# Patient Record
Sex: Female | Born: 1955
Health system: Southern US, Community
[De-identification: ages and names within clinical notes are randomized; demographics above are authoritative.]

## PROBLEM LIST (undated history)

## (undated) DIAGNOSIS — K219 Gastro-esophageal reflux disease without esophagitis: Secondary | ICD-10-CM

## (undated) DIAGNOSIS — E039 Hypothyroidism, unspecified: Secondary | ICD-10-CM

## (undated) DIAGNOSIS — I7100 Dissection of unspecified site of aorta: Secondary | ICD-10-CM

## (undated) DIAGNOSIS — H409 Unspecified glaucoma: Secondary | ICD-10-CM

## (undated) DIAGNOSIS — H543 Unqualified visual loss, both eyes: Secondary | ICD-10-CM

## (undated) DIAGNOSIS — E785 Hyperlipidemia, unspecified: Secondary | ICD-10-CM

## (undated) DIAGNOSIS — R7303 Prediabetes: Secondary | ICD-10-CM

## (undated) DIAGNOSIS — I739 Peripheral vascular disease, unspecified: Secondary | ICD-10-CM

## (undated) DIAGNOSIS — M1990 Unspecified osteoarthritis, unspecified site: Secondary | ICD-10-CM

## (undated) DIAGNOSIS — F329 Major depressive disorder, single episode, unspecified: Secondary | ICD-10-CM

## (undated) DIAGNOSIS — F32A Depression, unspecified: Secondary | ICD-10-CM

## (undated) DIAGNOSIS — D649 Anemia, unspecified: Secondary | ICD-10-CM

## (undated) DIAGNOSIS — I10 Essential (primary) hypertension: Secondary | ICD-10-CM

## (undated) DIAGNOSIS — F419 Anxiety disorder, unspecified: Secondary | ICD-10-CM

## (undated) DIAGNOSIS — Z9289 Personal history of other medical treatment: Secondary | ICD-10-CM

## (undated) DIAGNOSIS — Z8614 Personal history of Methicillin resistant Staphylococcus aureus infection: Secondary | ICD-10-CM

## (undated) HISTORY — DX: Morbid (severe) obesity due to excess calories: E66.01

## (undated) HISTORY — DX: Essential (primary) hypertension: I10

## (undated) HISTORY — PX: TONSILLECTOMY: SUR1361

## (undated) HISTORY — PX: EYE SURGERY: SHX253

## (undated) HISTORY — DX: Hyperlipidemia, unspecified: E78.5

## (undated) HISTORY — PX: COLONOSCOPY: SHX174

## (undated) HISTORY — PX: KNEE SURGERY: SHX244

---

## 1979-07-05 HISTORY — PX: ABDOMINAL HYSTERECTOMY: SHX81

## 2008-07-04 HISTORY — PX: CATARACT EXTRACTION: SUR2

## 2014-06-22 HISTORY — PX: KNEE DEBRIDEMENT: SHX1894

## 2014-07-04 HISTORY — PX: JOINT REPLACEMENT: SHX530

## 2014-07-07 HISTORY — PX: DEBRIDEMENT AND CLOSURE WOUND: SHX5614

## 2016-01-13 ENCOUNTER — Other Ambulatory Visit (HOSPITAL_COMMUNITY): Payer: Self-pay | Admitting: General Surgery

## 2016-01-14 ENCOUNTER — Other Ambulatory Visit: Payer: Self-pay | Admitting: General Surgery

## 2016-01-14 DIAGNOSIS — Z1231 Encounter for screening mammogram for malignant neoplasm of breast: Secondary | ICD-10-CM

## 2016-01-19 ENCOUNTER — Ambulatory Visit
Admission: RE | Admit: 2016-01-19 | Discharge: 2016-01-19 | Disposition: A | Discharge status: Home / Self Care | Payer: BLUE CROSS/BLUE SHIELD | Source: Ambulatory Visit | Attending: General Surgery | Admitting: General Surgery

## 2016-01-19 DIAGNOSIS — Z1231 Encounter for screening mammogram for malignant neoplasm of breast: Secondary | ICD-10-CM

## 2016-01-25 ENCOUNTER — Ambulatory Visit (HOSPITAL_COMMUNITY)
Admission: RE | Admit: 2016-01-25 | Discharge: 2016-01-25 | Disposition: A | Discharge status: Home / Self Care | Payer: BLUE CROSS/BLUE SHIELD | Source: Ambulatory Visit | Attending: General Surgery | Admitting: General Surgery

## 2016-01-25 DIAGNOSIS — K219 Gastro-esophageal reflux disease without esophagitis: Secondary | ICD-10-CM | POA: Diagnosis not present

## 2016-01-25 DIAGNOSIS — M5136 Other intervertebral disc degeneration, lumbar region: Secondary | ICD-10-CM | POA: Diagnosis not present

## 2016-01-25 DIAGNOSIS — N200 Calculus of kidney: Secondary | ICD-10-CM | POA: Insufficient documentation

## 2016-01-25 DIAGNOSIS — M4186 Other forms of scoliosis, lumbar region: Secondary | ICD-10-CM | POA: Diagnosis not present

## 2016-01-25 DIAGNOSIS — K5710 Diverticulosis of small intestine without perforation or abscess without bleeding: Secondary | ICD-10-CM | POA: Insufficient documentation

## 2016-01-25 HISTORY — PX: UPPER GI ENDOSCOPY: SHX6162

## 2016-02-03 ENCOUNTER — Encounter: Payer: BLUE CROSS/BLUE SHIELD | Attending: General Surgery | Admitting: Dietician

## 2016-02-03 ENCOUNTER — Encounter: Payer: Self-pay | Admitting: Dietician

## 2016-02-03 DIAGNOSIS — I10 Essential (primary) hypertension: Secondary | ICD-10-CM | POA: Insufficient documentation

## 2016-02-03 DIAGNOSIS — R7303 Prediabetes: Secondary | ICD-10-CM | POA: Insufficient documentation

## 2016-02-03 DIAGNOSIS — Z713 Dietary counseling and surveillance: Secondary | ICD-10-CM | POA: Insufficient documentation

## 2016-02-03 DIAGNOSIS — M1611 Unilateral primary osteoarthritis, right hip: Secondary | ICD-10-CM | POA: Diagnosis not present

## 2016-02-03 NOTE — Progress Notes (Signed)
  Pre-Op Assessment Visit:  Pre-Operative sleeve gastrectomy Surgery  Medical Nutrition Therapy:  Appt start time: 0940   End time:  1020.  Patient was seen on 02/03/2016 for Pre-Operative Nutrition Assessment. Assessment and letter of approval faxed to [facility] Bariatric Surgery Program coordinator on 02/03/2016.   Preferred Learning Style:   No preference indicated   Learning Readiness:   Ready  Handouts given during visit include:  Pre-Op Goals Bariatric Surgery Protein Shakes   During the appointment today the following Pre-Op Goals were reviewed with the patient: Maintain or lose weight as instructed by your surgeon Make healthy food choices Begin to limit portion sizes Limited concentrated sugars and fried foods Keep fat/sugar in the single digits per serving on   food labels Practice CHEWING your food  (aim for 30 chews per bite or until applesauce consistency) Practice not drinking 15 minutes before, during, and 30 minutes after each meal/snack Avoid all carbonated beverages  Avoid/limit caffeinated beverages  Avoid all sugar-sweetened beverages Consume 3 meals per day; eat every 3-5 hours Make a list of non-food related activities Aim for 64-100 ounces of FLUID daily  Aim for at least 60-80 grams of PROTEIN daily Look for a liquid protein source that contain ?15 g protein and ?5 g carbohydrate  (ex: shakes, drinks, shots)  Demonstrated degree of understanding via:  Teach Back  Teaching Method Utilized:  Visual Auditory Hands on  Barriers to learning/adherence to lifestyle change: limited mobility  [facility] to call the [facility] to enroll in Pre-Op and Post-Op Nutrition Education when surgery date is scheduled.

## 2016-02-08 ENCOUNTER — Ambulatory Visit: Payer: BLUE CROSS/BLUE SHIELD

## 2016-02-15 DIAGNOSIS — Z713 Dietary counseling and surveillance: Secondary | ICD-10-CM | POA: Diagnosis not present

## 2016-02-17 NOTE — Progress Notes (Signed)
  Pre-Operative Nutrition Class:  Appt start time: 0051   End time:  1830.  Patient was seen on 02/15/2016 for Pre-Operative Bariatric Surgery Education at the [facility].   Surgery date:  Surgery type: sleeve gastrectomy Start weight at [facility]: 215 lbs on 02/03/2016 Weight today: 212.2 lbs  TANITA  BODY COMP RESULTS  02/15/16   BMI (kg/m^2) 41.4   Fat Mass (lbs) 87.6   Fat Free Mass (lbs) 124.6   Total Body Water (lbs) 89.2   Samples given per MNT protocol. Patient educated on appropriate usage: Premier protein shake (vanilla - qty 1) Lot #: [id number] Exp: 09/2016  Bariatric Advantage Calcium Citrate chew (lemon - qty 1) Lot #: [id number] Exp: 12/2016  Bariatric Advantage Vitamins Multivitamin (mixed fruit - qty 1) Lot #: [id number] Exp: 01/2017  Renee Pain Protein Powder (chocolate - qty 1) Lot #: [id number] Exp: 04/2017  The following the learning objectives were met by the patient during this course:  Identify Pre-Op Dietary Goals and will begin 2 weeks pre-operatively  Identify appropriate sources of fluids and proteins   State protein recommendations and appropriate sources pre and post-operatively  Identify Post-Operative Dietary Goals and will follow for 2 weeks post-operatively  Identify appropriate multivitamin and calcium sources  Describe the need for physical activity post-operatively and will follow MD recommendations  State when to call healthcare provider regarding medication questions or post-operative complications  Handouts given during class include:  Pre-Op Bariatric Surgery Diet Handout  Protein Shake Handout  Post-Op Bariatric Surgery Nutrition Handout  BELT Program Information Flyer  Support Group Information Flyer  [facility] Bariatric Supplements Price List  Follow-Up Plan: Patient will follow-up at [facility] 2 weeks post operatively for diet advancement per MD.

## 2016-02-19 ENCOUNTER — Encounter: Payer: Self-pay | Admitting: Internal Medicine

## 2016-02-19 ENCOUNTER — Ambulatory Visit (INDEPENDENT_AMBULATORY_CARE_PROVIDER_SITE_OTHER): Payer: BLUE CROSS/BLUE SHIELD | Admitting: Internal Medicine

## 2016-02-19 VITALS — BP 100/64 | HR 66 | Ht 60.0 in | Wt 211.8 lb

## 2016-02-19 DIAGNOSIS — I10 Essential (primary) hypertension: Secondary | ICD-10-CM | POA: Diagnosis not present

## 2016-02-19 DIAGNOSIS — Z01810 Encounter for preprocedural cardiovascular examination: Secondary | ICD-10-CM | POA: Diagnosis not present

## 2016-02-19 DIAGNOSIS — E785 Hyperlipidemia, unspecified: Secondary | ICD-10-CM | POA: Diagnosis not present

## 2016-02-19 NOTE — Progress Notes (Signed)
OFFICE NOTE  Chief Complaint:  Preoperative clearance  Primary Care Physician: Blane OharaOX,KIRSTEN, MD  HPI:  Lauren Clark is a 60 y.o. female with a past medical history significant for morbid obesity, hypertension, dyslipidemia and knee surgery last year which became infected and required repeat operations. She reports significant right hip problems and has not been a candidate for hip replacement due to her weight. She was referred then to Dr. Andrey CampanileWilson for evaluation of bariatric surgery. Subsequently she was sent here for evaluation for cardiac risk. There is no significant heart disease in her family. She denies any history of chest pain or shortness of breath with exertion. She was fairly physically active up until last year and asymptomatic. She has type hypertension but is very well controlled. Blood pressure today was 100/64. Her cholesterol has been at goal. She has impaired fasting glucose but not diabetes. She has no history of alcohol or tobacco use. 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.  PMHx:  Past Medical History:  Diagnosis Date  . Hyperlipidemia   . Hypertension   . Morbid obesity (HCC)     Past Surgical History:  Procedure Laterality Date  . ABDOMINAL HYSTERECTOMY      FAMHx:  Family History  Problem Relation Age of Onset  . Depression Mother   . Hypertension Father     SOCHx:   reports that she has never smoked. She has never used smokeless tobacco. She reports that she does not drink alcohol or use drugs.  ALLERGIES:  Allergies  Allergen Reactions  . Crestor [Rosuvastatin Calcium] Other (See Comments)    Muscle aches    . Hctz [Hydrochlorothiazide] Other (See Comments)    Pt doesn't remember  . Lipitor [Atorvastatin] Other (See Comments)    Muscle aches     ROS: Pertinent items noted in HPI and remainder of comprehensive ROS otherwise negative.  HOME MEDS: Current Outpatient Prescriptions  Medication Sig Dispense Refill  . aspirin EC  81 MG tablet Take 81 mg by mouth daily.    Marland Kitchen. diltiazem (CARDIZEM CD) 180 MG 24 hr capsule Take 180 mg by mouth daily.    . furosemide (LASIX) 20 MG tablet Take 20 mg by mouth.    Marland Kitchen. glucosamine-chondroitin 500-400 MG tablet Take 1 tablet by mouth 3 (three) times daily.    Marland Kitchen. latanoprost (XALATAN) 0.005 % ophthalmic solution 1 drop at bedtime.    Marland Kitchen. levothyroxine (SYNTHROID, LEVOTHROID) 75 MCG tablet Take 75 mcg by mouth daily before breakfast.    . Lorcaserin HCl (BELVIQ) 10 MG TABS Take by mouth.    . magnesium citrate SOLN Take 1 Bottle by mouth once.    . meloxicam (MOBIC) 15 MG tablet Take 15 mg by mouth daily.    . phentermine 37.5 MG capsule Take 37.5 mg by mouth every morning.    . rosuvastatin (CRESTOR) 5 MG tablet Take 5 mg by mouth daily.    . traMADol (ULTRAM) 50 MG tablet Take by mouth every 6 (six) hours as needed.    . Vitamin D, Ergocalciferol, (DRISDOL) 50000 units CAPS capsule Take 50,000 Units by mouth every 7 (seven) days.     No current facility-administered medications for this visit.     LABS/IMAGING: No results found for this or any previous visit (from the past 48 hour(s)). No results found.  WEIGHTS: Wt Readings from Last 3 Encounters:  02/19/16 211 lb 12.8 oz (96.1 kg)  02/17/16 212 lb 3.2 oz (96.3 kg)  02/03/16  214 lb 14.4 oz (97.5 kg)    VITALS: BP 100/64 (BP Location: Right Wrist, Cuff Size: Normal)   Pulse 66   Ht 5' (1.524 m)   Wt 211 lb 12.8 oz (96.1 kg)   BMI 41.36 kg/m   EXAM: General appearance: alert, no distress and morbidly obese Neck: no carotid bruit and no JVD Lungs: clear to auscultation bilaterally Heart: regular rate and rhythm Abdomen: soft, non-tender; bowel sounds normal; no masses,  no organomegaly and morbidly obese Extremities: extremities normal, atraumatic, no cyanosis or edema and right knee midline scar Pulses: 2+ and symmetric Skin: Skin color, texture, turgor normal. No rashes or lesions Neurologic: Grossly  normal Psych: Pleasant  EKG: Normal sinus rhythm at 66, rightward axis, no ischemia  ASSESSMENT: 1. Low to intermediate risk for bariatric surgery 2. Hypertension-controlled 3. Dyslipidemia-at goal 4. Morbid obesity 5. Right hip pain  PLAN: 1.   Lauren Clark is quite asymptomatic. She was physically active up until about a year ago prior to her knee surgery and has had progressive hip pain. Although she is morbidly obese, she is not diabetic and has hypertension and dyslipidemia which are well controlled. EKG is normal. There is no significant heart disease in her family that she is aware of. She does not have sleep apnea and a low sleepiness score. She's never been a smoker. I think she is at acceptable risk for her upcoming surgery which is a ready scheduled on August 28. She should also be acceptable risk for hip surgery if performed within the next year. No further ischemic testing is necessary at this time. Follow-up with me as needed.  Thanks for the kind referral.  Chrystie NoseKenneth C. Hilty, MD, [facility]ACC Attending Cardiologist [facility]  Chrystie NoseKenneth C Hilty 02/19/2016, 9:37 AM

## 2016-02-19 NOTE — Patient Instructions (Signed)
Your physician recommends that you schedule a follow-up appointment as needed w/Dr. Rennis GoldenHilty  Dr. Rennis GoldenHilty has cleared your for your surgical procedure

## 2016-02-23 NOTE — Progress Notes (Signed)
ekg with lo and clearance dr hilty 8/17 epic, chest 1/17 epic

## 2016-02-24 ENCOUNTER — Ambulatory Visit: Payer: Self-pay | Admitting: General Surgery

## 2016-02-24 ENCOUNTER — Encounter (HOSPITAL_COMMUNITY): Payer: Self-pay

## 2016-02-24 ENCOUNTER — Encounter (HOSPITAL_COMMUNITY)
Admission: RE | Admit: 2016-02-24 | Discharge: 2016-02-24 | Disposition: A | Discharge status: Home / Self Care | Payer: BLUE CROSS/BLUE SHIELD | Source: Ambulatory Visit | Attending: General Surgery | Admitting: General Surgery

## 2016-02-24 DIAGNOSIS — Z01812 Encounter for preprocedural laboratory examination: Secondary | ICD-10-CM | POA: Diagnosis present

## 2016-02-24 HISTORY — DX: Personal history of other medical treatment: Z92.89

## 2016-02-24 HISTORY — DX: Anxiety disorder, unspecified: F41.9

## 2016-02-24 HISTORY — DX: Unspecified osteoarthritis, unspecified site: M19.90

## 2016-02-24 HISTORY — DX: Peripheral vascular disease, unspecified: I73.9

## 2016-02-24 HISTORY — DX: Depression, unspecified: F32.A

## 2016-02-24 HISTORY — DX: Major depressive disorder, single episode, unspecified: F32.9

## 2016-02-24 LAB — CBC WITH DIFFERENTIAL/PLATELET
Basophils Absolute: 0 10*3/uL (ref 0.0–0.1)
Basophils Relative: 0 %
EOS ABS: 0.1 10*3/uL (ref 0.0–0.7)
Eosinophils Relative: 1 %
HEMATOCRIT: 42.9 % (ref 36.0–46.0)
HEMOGLOBIN: 14.1 g/dL (ref 12.0–15.0)
LYMPHS ABS: 1.2 10*3/uL (ref 0.7–4.0)
Lymphocytes Relative: 11 %
MCH: 30.2 pg (ref 26.0–34.0)
MCHC: 32.9 g/dL (ref 30.0–36.0)
MCV: 91.9 fL (ref 78.0–100.0)
Monocytes Absolute: 1 10*3/uL (ref 0.1–1.0)
Monocytes Relative: 9 %
NEUTROS ABS: 8.8 10*3/uL — AB (ref 1.7–7.7)
NEUTROS PCT: 79 %
Platelets: 324 10*3/uL (ref 150–400)
RBC: 4.67 MIL/uL (ref 3.87–5.11)
RDW: 13 % (ref 11.5–15.5)
WBC: 11 10*3/uL — AB (ref 4.0–10.5)

## 2016-02-24 LAB — COMPREHENSIVE METABOLIC PANEL
ALBUMIN: 4.4 g/dL (ref 3.5–5.0)
ALK PHOS: 118 U/L (ref 38–126)
ALT: 20 U/L (ref 14–54)
AST: 21 U/L (ref 15–41)
Anion gap: 10 (ref 5–15)
BILIRUBIN TOTAL: 0.5 mg/dL (ref 0.3–1.2)
BUN: 30 mg/dL — ABNORMAL HIGH (ref 6–20)
CALCIUM: 9.6 mg/dL (ref 8.9–10.3)
CO2: 27 mmol/L (ref 22–32)
CREATININE: 1.28 mg/dL — AB (ref 0.44–1.00)
Chloride: 101 mmol/L (ref 101–111)
GFR calc non Af Amer: 44 mL/min — ABNORMAL LOW (ref >60)
GFR, EST AFRICAN AMERICAN: 51 mL/min — AB (ref >60)
GLUCOSE: 107 mg/dL — AB (ref 65–99)
Potassium: 4.6 mmol/L (ref 3.5–5.1)
SODIUM: 138 mmol/L (ref 135–145)
Total Protein: 7.8 g/dL (ref 6.5–8.1)

## 2016-02-24 NOTE — Progress Notes (Signed)
PAT VISIT-- pt states still taking Belviq and Phenteramine.  As per anesthesia protocol  instructed to stop as of today. Verbalized understanding

## 2016-02-24 NOTE — Patient Instructions (Signed)
Lyla SonWaneta B Eckles  02/24/2016   Your procedure is scheduled on: 02/29/16  [facility]  [place]  elevators to 3rd floor to  Short Stay Center at  [phone] AM.  Call this number if you have problems the morning of surgery [phone]   Remember: ONLY 1 PERSON MAY GO WITH YOU TO SHORT STAY TO GET  READY MORNING OF YOUR SURGERY.  Do not eat food or drink liquids :After Midnight.     Take these medicines the morning of surgery with A SIP OF WATER: Fluoxetine, Levothyroxine May takr Tramadol if needed                                You may not have any metal on your body including hair pins and              piercings  Do not wear jewelry, make-up, lotions, powders or perfumes, deodorant             Do not wear nail polish.  Do not shave  48 hours prior to surgery.              Men may shave face and neck.   Do not bring valuables to the hospital. [facility] IS NOT             RESPONSIBLE   FOR VALUABLES.  Contacts, dentures or bridgework may not be worn into surgery.  Leave suitcase in the car. After surgery it may be brought to your room.              [place] - Preparing for Surgery Before surgery, you can play an important role.  Because skin is not sterile, your skin needs to be as free of germs as possible.  You can reduce the number of germs on your skin by washing with CHG (chlorahexidine gluconate) soap before surgery.  CHG is an antiseptic cleaner which kills germs and bonds with the skin to continue killing germs even after washing. Please DO NOT use if you have an allergy to CHG or antibacterial soaps.  If your skin becomes reddened/irritated stop using the CHG and inform your nurse when you arrive at Short Stay. Do not shave (including legs and underarms) for at least 48 hours prior to the first CHG shower.  You may shave your face/neck. Please follow these instructions carefully:  1.  Shower with CHG Soap the night before surgery  and the  morning of Surgery.  2.  If you choose to wash your hair, wash your hair first as usual with your  normal  shampoo.  3.  After you shampoo, rinse your hair and body thoroughly to remove the  shampoo.                           4.  Use CHG as you would any other liquid soap.  You can apply chg directly  to the skin and wash                       Gently with a scrungie or clean washcloth.  5.  Apply the CHG Soap to your body ONLY FROM THE NECK DOWN.   Do not use on face/ open  Wound or open sores. Avoid contact with eyes, ears mouth and genitals (private parts).                       Wash face,  Genitals (private parts) with your normal soap.             6.  Wash thoroughly, paying special attention to the area where your surgery  will be performed.  7.  Thoroughly rinse your body with warm water from the neck down.  8.  DO NOT shower/wash with your normal soap after using and rinsing off  the CHG Soap.                9.  Pat yourself dry with a clean towel.            10.  Wear clean pajamas.            11.  Place clean sheets on your bed the night of your first shower and do not  sleep with pets. Day of Surgery : Do not apply any lotions/deodorants the morning of surgery.  Please wear clean clothes to the hospital/surgery center.  FAILURE TO FOLLOW THESE INSTRUCTIONS MAY RESULT IN THE CANCELLATION OF YOUR SURGERY PATIENT SIGNATURE_________________________________  NURSE SIGNATURE__________________________________  ________________________________________________________________________

## 2016-02-29 ENCOUNTER — Inpatient Hospital Stay (HOSPITAL_COMMUNITY)
Admission: RE | Admit: 2016-02-29 | Discharge: 2016-03-02 | DRG: 621 | Disposition: A | Discharge status: Home / Self Care | Payer: BLUE CROSS/BLUE SHIELD | Source: Ambulatory Visit | Attending: General Surgery | Admitting: General Surgery

## 2016-02-29 ENCOUNTER — Inpatient Hospital Stay (HOSPITAL_COMMUNITY): Payer: BLUE CROSS/BLUE SHIELD | Admitting: Anesthesiology

## 2016-02-29 ENCOUNTER — Encounter (HOSPITAL_COMMUNITY)
Admission: RE | Disposition: A | Discharge status: Home / Self Care | Payer: Self-pay | Source: Ambulatory Visit | Attending: General Surgery

## 2016-02-29 ENCOUNTER — Encounter (HOSPITAL_COMMUNITY): Payer: Self-pay | Admitting: *Deleted

## 2016-02-29 DIAGNOSIS — R7303 Prediabetes: Secondary | ICD-10-CM | POA: Diagnosis present

## 2016-02-29 DIAGNOSIS — K219 Gastro-esophageal reflux disease without esophagitis: Secondary | ICD-10-CM | POA: Diagnosis present

## 2016-02-29 DIAGNOSIS — Z79899 Other long term (current) drug therapy: Secondary | ICD-10-CM | POA: Diagnosis not present

## 2016-02-29 DIAGNOSIS — Z96651 Presence of right artificial knee joint: Secondary | ICD-10-CM | POA: Diagnosis present

## 2016-02-29 DIAGNOSIS — I10 Essential (primary) hypertension: Secondary | ICD-10-CM | POA: Diagnosis present

## 2016-02-29 DIAGNOSIS — Z6841 Body Mass Index (BMI) 40.0 and over, adult: Secondary | ICD-10-CM

## 2016-02-29 DIAGNOSIS — M1611 Unilateral primary osteoarthritis, right hip: Secondary | ICD-10-CM | POA: Diagnosis present

## 2016-02-29 DIAGNOSIS — E785 Hyperlipidemia, unspecified: Secondary | ICD-10-CM | POA: Diagnosis present

## 2016-02-29 DIAGNOSIS — E079 Disorder of thyroid, unspecified: Secondary | ICD-10-CM | POA: Diagnosis present

## 2016-02-29 DIAGNOSIS — Z9841 Cataract extraction status, right eye: Secondary | ICD-10-CM

## 2016-02-29 DIAGNOSIS — Z9884 Bariatric surgery status: Secondary | ICD-10-CM

## 2016-02-29 HISTORY — PX: LAPAROSCOPIC GASTRIC SLEEVE RESECTION: SHX5895

## 2016-02-29 LAB — HEMOGLOBIN AND HEMATOCRIT, BLOOD
HEMATOCRIT: 35.2 % — AB (ref 36.0–46.0)
HEMOGLOBIN: 11.5 g/dL — AB (ref 12.0–15.0)

## 2016-02-29 SURGERY — GASTRECTOMY, SLEEVE, LAPAROSCOPIC
Anesthesia: General | Site: Abdomen

## 2016-02-29 MED ORDER — SODIUM CHLORIDE 0.9 % IR SOLN
Status: DC | PRN
  Administered 2016-02-29: 1000 mL

## 2016-02-29 MED ORDER — MORPHINE SULFATE (PF) 2 MG/ML IV SOLN
2.0000 mg | INTRAVENOUS | Status: DC | PRN
  Administered 2016-03-01: 2 mg via INTRAVENOUS
  Filled 2016-02-29: qty 1

## 2016-02-29 MED ORDER — HEPARIN SODIUM (PORCINE) 5000 UNIT/ML IJ SOLN
5000.0000 [IU] | INTRAMUSCULAR | Status: AC
  Administered 2016-02-29: 5000 [IU] via SUBCUTANEOUS
  Filled 2016-02-29: qty 1

## 2016-02-29 MED ORDER — ROCURONIUM BROMIDE 100 MG/10ML IV SOLN
INTRAVENOUS | Status: AC
  Filled 2016-02-29: qty 1

## 2016-02-29 MED ORDER — PROPOFOL 10 MG/ML IV BOLUS
INTRAVENOUS | Status: AC
  Filled 2016-02-29: qty 20

## 2016-02-29 MED ORDER — ONDANSETRON HCL 4 MG/2ML IJ SOLN
INTRAMUSCULAR | Status: AC
  Filled 2016-02-29: qty 2

## 2016-02-29 MED ORDER — ACETAMINOPHEN 10 MG/ML IV SOLN
INTRAVENOUS | Status: AC
  Filled 2016-02-29: qty 100

## 2016-02-29 MED ORDER — FENTANYL CITRATE (PF) 100 MCG/2ML IJ SOLN
25.0000 ug | INTRAMUSCULAR | Status: DC | PRN
  Administered 2016-02-29: 50 ug via INTRAVENOUS

## 2016-02-29 MED ORDER — KCL IN DEXTROSE-NACL 20-5-0.45 MEQ/L-%-% IV SOLN
INTRAVENOUS | Status: DC
  Administered 2016-02-29: 125 mL/h via INTRAVENOUS
  Administered 2016-03-01: 11:00:00 via INTRAVENOUS
  Filled 2016-02-29 (×5): qty 1000

## 2016-02-29 MED ORDER — SUGAMMADEX SODIUM 200 MG/2ML IV SOLN
INTRAVENOUS | Status: AC
  Filled 2016-02-29: qty 2

## 2016-02-29 MED ORDER — STERILE WATER FOR IRRIGATION IR SOLN
Status: DC | PRN
  Administered 2016-02-29: 2000 mL

## 2016-02-29 MED ORDER — SUGAMMADEX SODIUM 200 MG/2ML IV SOLN
INTRAVENOUS | Status: DC | PRN
  Administered 2016-02-29: 200 mg via INTRAVENOUS

## 2016-02-29 MED ORDER — ACETAMINOPHEN 10 MG/ML IV SOLN
1000.0000 mg | Freq: Four times a day (QID) | INTRAVENOUS | Status: AC
  Administered 2016-02-29 – 2016-03-01 (×4): 1000 mg via INTRAVENOUS
  Filled 2016-02-29 (×5): qty 100

## 2016-02-29 MED ORDER — ONDANSETRON HCL 4 MG/2ML IJ SOLN: 4.0000 mg | INTRAMUSCULAR | Status: DC | PRN

## 2016-02-29 MED ORDER — OXYCODONE HCL 5 MG/5ML PO SOLN
5.0000 mg | ORAL | Status: DC | PRN
  Administered 2016-03-02: 5 mg via ORAL
  Filled 2016-02-29: qty 5

## 2016-02-29 MED ORDER — FENTANYL CITRATE (PF) 100 MCG/2ML IJ SOLN
INTRAMUSCULAR | Status: AC
Start: 2016-02-29 — End: 2016-02-29
  Filled 2016-02-29: qty 2

## 2016-02-29 MED ORDER — ENALAPRILAT 1.25 MG/ML IV SOLN
1.2500 mg | Freq: Four times a day (QID) | INTRAVENOUS | Status: DC | PRN
  Filled 2016-02-29: qty 1

## 2016-02-29 MED ORDER — FAMOTIDINE IN NACL 20-0.9 MG/50ML-% IV SOLN
20.0000 mg | Freq: Two times a day (BID) | INTRAVENOUS | Status: DC
  Administered 2016-02-29 – 2016-03-01 (×3): 20 mg via INTRAVENOUS
  Filled 2016-02-29 (×4): qty 50

## 2016-02-29 MED ORDER — CEFOTETAN DISODIUM-DEXTROSE 2-2.08 GM-% IV SOLR
2.0000 g | INTRAVENOUS | Status: AC
  Administered 2016-02-29: 2 g via INTRAVENOUS

## 2016-02-29 MED ORDER — PROMETHAZINE HCL 25 MG/ML IJ SOLN: 12.5000 mg | Freq: Four times a day (QID) | INTRAMUSCULAR | Status: DC | PRN

## 2016-02-29 MED ORDER — PREMIER PROTEIN SHAKE
2.0000 [oz_av] | ORAL | Status: DC
Start: 2016-03-02 — End: 2016-03-02
  Administered 2016-03-02 (×2): 2 [oz_av] via ORAL

## 2016-02-29 MED ORDER — DIPHENHYDRAMINE HCL 50 MG/ML IJ SOLN
12.5000 mg | Freq: Three times a day (TID) | INTRAMUSCULAR | Status: DC | PRN
Start: 2016-02-29 — End: 2016-03-02

## 2016-02-29 MED ORDER — SODIUM CHLORIDE 0.9 % IJ SOLN
INTRAMUSCULAR | Status: AC
  Filled 2016-02-29: qty 50

## 2016-02-29 MED ORDER — ACETAMINOPHEN 160 MG/5ML PO SOLN: 325.0000 mg | ORAL | Status: DC | PRN

## 2016-02-29 MED ORDER — ONDANSETRON HCL 4 MG/2ML IJ SOLN
INTRAMUSCULAR | Status: DC | PRN
  Administered 2016-02-29: 4 mg via INTRAVENOUS

## 2016-02-29 MED ORDER — ACETAMINOPHEN 160 MG/5ML PO SOLN: 650.0000 mg | ORAL | Status: DC | PRN

## 2016-02-29 MED ORDER — APREPITANT 40 MG PO CAPS
40.0000 mg | ORAL_CAPSULE | ORAL | Status: AC
  Administered 2016-02-29: 40 mg via ORAL
  Filled 2016-02-29: qty 1

## 2016-02-29 MED ORDER — ACETAMINOPHEN 10 MG/ML IV SOLN
1000.0000 mg | Freq: Once | INTRAVENOUS | Status: AC
  Administered 2016-02-29: 1000 mg via INTRAVENOUS

## 2016-02-29 MED ORDER — LIDOCAINE HCL (CARDIAC) 20 MG/ML IV SOLN
INTRAVENOUS | Status: DC | PRN
  Administered 2016-02-29: 100 mg via INTRAVENOUS

## 2016-02-29 MED ORDER — LACTATED RINGERS IR SOLN
Status: DC | PRN
  Administered 2016-02-29: 1000 mL

## 2016-02-29 MED ORDER — DEXAMETHASONE SODIUM PHOSPHATE 10 MG/ML IJ SOLN
INTRAMUSCULAR | Status: DC | PRN
  Administered 2016-02-29: 10 mg via INTRAVENOUS

## 2016-02-29 MED ORDER — FENTANYL CITRATE (PF) 100 MCG/2ML IJ SOLN
INTRAMUSCULAR | Status: AC
  Filled 2016-02-29: qty 2

## 2016-02-29 MED ORDER — FENTANYL CITRATE (PF) 100 MCG/2ML IJ SOLN
INTRAMUSCULAR | Status: DC | PRN
  Administered 2016-02-29: 150 ug via INTRAVENOUS
  Administered 2016-02-29: 50 ug via INTRAVENOUS

## 2016-02-29 MED ORDER — ROCURONIUM BROMIDE 100 MG/10ML IV SOLN
INTRAVENOUS | Status: DC | PRN
  Administered 2016-02-29: 5 mg via INTRAVENOUS
  Administered 2016-02-29: 50 mg via INTRAVENOUS

## 2016-02-29 MED ORDER — DEXAMETHASONE SODIUM PHOSPHATE 10 MG/ML IJ SOLN
INTRAMUSCULAR | Status: AC
Start: 2016-02-29 — End: 2016-02-29
  Filled 2016-02-29: qty 2

## 2016-02-29 MED ORDER — HEPARIN SODIUM (PORCINE) 5000 UNIT/ML IJ SOLN
5000.0000 [IU] | Freq: Once | INTRAMUSCULAR | Status: AC
  Administered 2016-02-29: 5000 [IU] via SUBCUTANEOUS
  Filled 2016-02-29: qty 1

## 2016-02-29 MED ORDER — LATANOPROST 0.005 % OP SOLN
1.0000 [drp] | Freq: Every day | OPHTHALMIC | Status: DC
  Administered 2016-03-01 (×2): 1 [drp] via OPHTHALMIC
  Filled 2016-02-29: qty 2.5

## 2016-02-29 MED ORDER — SODIUM CHLORIDE 0.9 % IJ SOLN
INTRAMUSCULAR | Status: DC | PRN
  Administered 2016-02-29: 50 mL

## 2016-02-29 MED ORDER — LACTATED RINGERS IV SOLN
INTRAVENOUS | Status: DC
  Administered 2016-02-29: 1000 mL via INTRAVENOUS
  Administered 2016-02-29: 13:00:00 via INTRAVENOUS

## 2016-02-29 MED ORDER — ENOXAPARIN SODIUM 30 MG/0.3ML ~~LOC~~ SOLN
30.0000 mg | Freq: Two times a day (BID) | SUBCUTANEOUS | Status: DC
  Administered 2016-03-01 – 2016-03-02 (×3): 30 mg via SUBCUTANEOUS
  Filled 2016-02-29 (×3): qty 0.3

## 2016-02-29 MED ORDER — BUPIVACAINE LIPOSOME 1.3 % IJ SUSP
20.0000 mL | Freq: Once | INTRAMUSCULAR | Status: AC
  Administered 2016-02-29: 20 mL
  Filled 2016-02-29: qty 20

## 2016-02-29 MED ORDER — PROPOFOL 10 MG/ML IV BOLUS
INTRAVENOUS | Status: DC | PRN
  Administered 2016-02-29: 150 mg via INTRAVENOUS

## 2016-02-29 MED ORDER — LIDOCAINE HCL (CARDIAC) 20 MG/ML IV SOLN
INTRAVENOUS | Status: AC
  Filled 2016-02-29: qty 5

## 2016-02-29 MED ORDER — CEFOTETAN DISODIUM-DEXTROSE 2-2.08 GM-% IV SOLR
INTRAVENOUS | Status: AC
  Filled 2016-02-29: qty 50

## 2016-02-29 SURGICAL SUPPLY — 73 items
ADH SKN CLS APL DERMABOND .7 (GAUZE/BANDAGES/DRESSINGS) ×2
APL SRG 32X5 SNPLK LF DISP (MISCELLANEOUS)
APPLICATOR COTTON TIP 6IN STRL (MISCELLANEOUS) IMPLANT
APPLIER CLIP ROT 10 11.4 M/L (STAPLE)
APR CLP MED LRG 11.4X10 (STAPLE)
BLADE SURG SZ11 CARB STEEL (BLADE) ×3 IMPLANT
CABLE HIGH FREQUENCY MONO STRZ (ELECTRODE) ×2 IMPLANT
CHLORAPREP W/TINT 26ML (MISCELLANEOUS) ×6 IMPLANT
CLIP APPLIE ROT 10 11.4 M/L (STAPLE) IMPLANT
COVER SURGICAL LIGHT HANDLE (MISCELLANEOUS) ×3 IMPLANT
DERMABOND ADVANCED (GAUZE/BANDAGES/DRESSINGS) ×4
DERMABOND ADVANCED .7 DNX12 (GAUZE/BANDAGES/DRESSINGS) ×1 IMPLANT
DEVICE SUT QUICK LOAD TK 5 (STAPLE) IMPLANT
DEVICE SUT TI-KNOT TK 5X26 (MISCELLANEOUS) IMPLANT
DEVICE SUTURE ENDOST 10MM (ENDOMECHANICALS) IMPLANT
DEVICE TI KNOT TK5 (MISCELLANEOUS)
DEVICE TROCAR PUNCTURE CLOSURE (ENDOMECHANICALS) IMPLANT
DRAPE UTILITY XL STRL (DRAPES) ×6 IMPLANT
ELECT L-HOOK LAP 45CM DISP (ELECTROSURGICAL)
ELECT PENCIL ROCKER SW 15FT (MISCELLANEOUS) IMPLANT
ELECT REM PT RETURN 9FT ADLT (ELECTROSURGICAL) ×3
ELECTRODE L-HOOK LAP 45CM DISP (ELECTROSURGICAL) IMPLANT
ELECTRODE REM PT RTRN 9FT ADLT (ELECTROSURGICAL) ×1 IMPLANT
GAUZE SPONGE 4X4 12PLY STRL (GAUZE/BANDAGES/DRESSINGS) IMPLANT
GLOVE BIO SURGEON STRL SZ7.5 (GLOVE) ×3 IMPLANT
GLOVE INDICATOR 8.0 STRL GRN (GLOVE) ×3 IMPLANT
GOWN STRL REUS W/TWL XL LVL3 (GOWN DISPOSABLE) ×11 IMPLANT
HOVERMATT SINGLE USE (MISCELLANEOUS) ×3 IMPLANT
IRRIG SUCT STRYKERFLOW 2 WTIP (MISCELLANEOUS) ×3
IRRIGATION SUCT STRKRFLW 2 WTP (MISCELLANEOUS) ×1 IMPLANT
KIT BASIN OR (CUSTOM PROCEDURE TRAY) ×3 IMPLANT
MARKER SKIN DUAL TIP RULER LAB (MISCELLANEOUS) ×3 IMPLANT
NDL SPNL 22GX3.5 QUINCKE BK (NEEDLE) ×1 IMPLANT
NEEDLE SPNL 22GX3.5 QUINCKE BK (NEEDLE) ×3 IMPLANT
PACK UNIVERSAL I (CUSTOM PROCEDURE TRAY) ×3 IMPLANT
QUICK LOAD TK 5 (STAPLE)
RELOAD STAPLE 60 3.6 BLU REG (STAPLE) IMPLANT
RELOAD STAPLE 60 3.8 GOLD REG (STAPLE) IMPLANT
RELOAD STAPLE 60 4.1 GRN THCK (STAPLE) IMPLANT
RELOAD STAPLER BLUE 60MM (STAPLE) ×1 IMPLANT
RELOAD STAPLER GOLD 60MM (STAPLE) ×1 IMPLANT
RELOAD STAPLER GREEN 60MM (STAPLE) ×3 IMPLANT
SCISSORS LAP 5X45 EPIX DISP (ENDOMECHANICALS) ×3 IMPLANT
SEALANT SURGICAL APPL DUAL CAN (MISCELLANEOUS) IMPLANT
SHEARS HARMONIC ACE PLUS 45CM (MISCELLANEOUS) ×3 IMPLANT
SLEEVE ADV FIXATION 5X100MM (TROCAR) ×6 IMPLANT
SLEEVE GASTRECTOMY 40FR VISIGI (MISCELLANEOUS) ×3 IMPLANT
SOLUTION ANTI FOG 6CC (MISCELLANEOUS) ×3 IMPLANT
SPONGE LAP 18X18 X RAY DECT (DISPOSABLE) ×3 IMPLANT
STAPLER ECHELON BIOABSB 60 FLE (MISCELLANEOUS) ×6 IMPLANT
STAPLER ECHELON LONG 60 440 (INSTRUMENTS) IMPLANT
STAPLER RELOAD BLUE 60MM (STAPLE) ×3
STAPLER RELOAD GOLD 60MM (STAPLE) ×3
STAPLER RELOAD GREEN 60MM (STAPLE) ×9
SUT MNCRL AB 4-0 PS2 18 (SUTURE) ×3 IMPLANT
SUT SURGIDAC NAB ES-9 0 48 120 (SUTURE) IMPLANT
SUT VIC AB 2-0 SH 27 (SUTURE) ×6
SUT VIC AB 2-0 SH 27X BRD (SUTURE) IMPLANT
SUT VICRYL 0 TIES 12 18 (SUTURE) ×3 IMPLANT
SYR 10ML ECCENTRIC (SYRINGE) ×3 IMPLANT
SYR 20CC LL (SYRINGE) ×3 IMPLANT
SYR 50ML LL SCALE MARK (SYRINGE) ×3 IMPLANT
TOWEL OR 17X26 10 PK STRL BLUE (TOWEL DISPOSABLE) ×3 IMPLANT
TOWEL OR NON WOVEN STRL DISP B (DISPOSABLE) ×3 IMPLANT
TRAY FOLEY W/METER SILVER 14FR (SET/KITS/TRAYS/PACK) IMPLANT
TRAY FOLEY W/METER SILVER 16FR (SET/KITS/TRAYS/PACK) IMPLANT
TROCAR ADV FIXATION 5X100MM (TROCAR) ×3 IMPLANT
TROCAR BLADELESS 15MM (ENDOMECHANICALS) ×3 IMPLANT
TROCAR BLADELESS OPT 5 100 (ENDOMECHANICALS) ×3 IMPLANT
TUBING CONNECTING 10 (TUBING) ×2 IMPLANT
TUBING CONNECTING 10' (TUBING) ×1
TUBING ENDO SMARTCAP (MISCELLANEOUS) ×3 IMPLANT
TUBING INSUF HEATED (TUBING) ×3 IMPLANT

## 2016-02-29 NOTE — Anesthesia Preprocedure Evaluation (Addendum)
Anesthesia Evaluation  Patient identified by MRN, date of birth, ID band Patient awake    Reviewed: Allergy & Precautions, H&P , Patient's Chart, lab work & pertinent test results, reviewed documented beta blocker date and time   Airway Mallampati: III  TM Distance: >3 FB Neck ROM: full    Dental no notable dental hx.    Pulmonary    Pulmonary exam normal breath sounds clear to auscultation       Cardiovascular hypertension, On Medications  Rhythm:regular Rate:Normal     Neuro/Psych    GI/Hepatic   Endo/Other  Morbid obesity  Renal/GU      Musculoskeletal   Abdominal   Peds  Hematology   Anesthesia Other Findings   Reproductive/Obstetrics                            Anesthesia Physical Anesthesia Plan  ASA: III  Anesthesia Plan: General   Post-op Pain Management:    Induction: Intravenous  Airway Management Planned: Oral ETT and Video Laryngoscope Planned  Additional Equipment:   Intra-op Plan:   Post-operative Plan: Extubation in OR  Informed Consent: I have reviewed the patients History and Physical, chart, labs and discussed the procedure including the risks, benefits and alternatives for the proposed anesthesia with the patient or authorized representative who has indicated his/her understanding and acceptance.   Dental Advisory Given and Dental advisory given  Plan Discussed with: CRNA and Surgeon  Anesthesia Plan Comments: (  Discussed general anesthesia, including possible nausea, instrumentation of airway, sore throat,pulmonary aspiration, etc. I asked if the were any outstanding questions, or  concerns before we proceeded. )        Anesthesia Quick Evaluation

## 2016-02-29 NOTE — Discharge Instructions (Addendum)
Aprepitant Discharge Instructions  °On the day of surgery you were given the medication aprepitant. This medication interacts with hormonal forms of birth control (oral contraceptives and injected or implanted birth control) and may make them ineffective. °IF YOU USE ANY HORMONAL FORM OF BIRTH CONTROL, YOU MUST USE AN ADDITIONAL BARRIER BIRTH CONTROL METHOD FOR ONE MONTH after receiving aprepitant or there is a chance you could become pregnant. ° ° ° ° °GASTRIC BYPASS/SLEEVE ° Home Care Instructions ° ° These instructions are to help you care for yourself when you go home. ° °Call: If you have any problems. °• Call [phone] and ask for the surgeon on call °• If you need immediate assistance come to the ER at [facility]. Tell the ER staff you are a new post-op gastric bypass or gastric sleeve patient  °Signs and symptoms to report: • Severe  vomiting or nausea °o If you cannot handle clear liquids for longer than 1 day, call your surgeon °• Abdominal pain which does not get better after taking your pain medication °• Fever greater than 100.4°  F and chills °• Heart rate over 100 beats a minute °• Trouble breathing °• Chest pain °• Redness,  swelling, drainage, or foul odor at incision (surgical) sites °• If your incisions open or pull apart °• Swelling or pain in calf (lower leg) °• Diarrhea (Loose bowel movements that happen often), frequent watery, uncontrolled bowel movements °• Constipation, (no bowel movements for 3 days) if this happens: °o Take Milk of Magnesia, 2 tablespoons by mouth, 3 times a day for 2 days if needed °o Stop taking Milk of Magnesia once you have had a bowel movement °o Call your doctor if constipation continues °Or °o Take Miralax  (instead of Milk of Magnesia) following the label instructions °o Stop taking Miralax once you have had a bowel movement °o Call your doctor if constipation continues °• Anything you think is “abnormal for you” °  °Normal side effects after surgery: • Unable  to sleep at night or unable to concentrate °• Irritability °• Being tearful (crying) or depressed ° °These are common complaints, possibly related to your anesthesia, stress of surgery, and change in lifestyle, that usually go away a few weeks after surgery. If these feelings continue, call your medical doctor.  °Wound Care: You may have surgical glue, steri-strips, or staples over your incisions after surgery °• Surgical glue: Looks like clear film over your incisions and will wear off a little at a time °• Steri-strips: Adhesive strips of tape over your incisions. You may notice a yellowish color on skin under the steri-strips. This is used to make the steri-strips stick better. Do not pull the steri-strips off - let them fall off °• Staples: Staples may be removed before you leave the hospital °o If you go home with staples, call [facility] for an appointment with your surgeon’s nurse to have staples removed 10 days after surgery, [phone] °• Showering: You may shower two (2) days after your surgery unless your surgeon tells you differently °o Wash gently around incisions with warm soapy water, rinse well, and gently pat dry °o If you have a drain (tube from your incision), you may need someone to hold this while you shower °o No tub baths until staples are removed and incisions are healed °  °Medications: • Medications should be liquid or crushed if larger than the size of a dime °• Extended release pills (medication that releases a little bit at   a time through the  day) should not be crushed °• Depending on the size and number of medications you take, you may need to space (take a few throughout the day)/change the time you take your medications so that you do not over-fill your pouch (smaller stomach) °• Make sure you follow-up with you primary care physician to make medication changes needed during rapid weight loss and life -style changes °• If you have diabetes, follow up with your  doctor that orders your diabetes medication(s) within one week after surgery and check your blood sugar regularly ° °• Do not drive while taking narcotics (pain medications) ° °• Do not take acetaminophen (Tylenol) and Roxicet or Lortab Elixir at the same time since these pain medications contain acetaminophen °  °Diet:  °First 2 Weeks You will see the nutritionist about two (2) weeks after your surgery. The nutritionist will increase the types of foods you can eat if you are handling liquids well: °• If you have severe vomiting or nausea and cannot handle clear liquids lasting longer than 1 day call your surgeon °Protein Shake °• Drink at least 2 ounces of shake 5-6 times per day °• Each serving of protein shakes (usually 8-12 ounces) should have a minimum of: °o 15 grams of protein °o And no more than 5 grams of carbohydrate °• Goal for protein each day: °o Men = 80 grams per day °o Women = 60 grams per day °  ° • Protein powder may be added to fluids such as non-fat milk or Lactaid milk or Soy milk (limit to 35 grams added protein powder per serving) ° °Hydration °• Slowly increase the amount of water and other clear liquids as tolerated (See Acceptable Fluids) °• Slowly increase the amount of protein shake as tolerated °• Sip fluids slowly and throughout the day °• May use sugar substitutes in small amounts (no more than 6-8 packets per day; i.e. Splenda) ° °Fluid Goal °• The first goal is to drink at least 8 ounces of protein shake/drink per day (or as directed by the nutritionist); some examples of protein shakes are Syntrax Nectar, Adkins Advantage, EAS Edge HP, and Unjury. - See handout from pre-op Bariatric Education Class: °o Slowly increase the amount of protein shake you drink as tolerated °o You may find it easier to slowly sip shakes throughout the day °o It is important to get your proteins in first °• Your fluid goal is to drink 64-100 ounces of fluid daily °o It may take a few weeks to build up to  this  °• 32 oz. (or more) should be clear liquids °And °• 32 oz. (or more) should be full liquids (see below for examples) °• Liquids should not contain sugar, caffeine, or carbonation ° °Clear Liquids: °• Water of Sugar-free flavored water (i.e. Fruit H²O, Propel) °• Decaffeinated coffee or tea (sugar-free) °• Crystal lite, Wyler’s Lite, Minute Maid Lite °• Sugar-free Jell-O °• Bouillon or broth °• Sugar-free Popsicle:    - Less than 20 calories each; Limit 1 per day ° °Full Liquids: °                  Protein Shakes/Drinks + 2 choices per day of other full liquids °• Full liquids must be: °o No More Than 12 grams of Carbs per serving °o No More Than 3 grams of Fat per serving °• Strained low-fat cream soup °• Non-Fat milk °• Fat-free Lactaid Milk °• Sugar-free yogurt (Dannon Lite & Fit, Greek   yogurt) ° °  °Vitamins and Minerals • Start 1 day after surgery unless otherwise directed by your surgeon °• 2 Chewable Multivitamin / Multimineral Supplement with iron (i.e. Centrum for Adults) °• Vitamin B-12, 350-500 micrograms sub-lingual (place tablet under the tongue) each day °• Chewable Calcium Citrate with Vitamin D-3 °(Example: 3 Chewable Calcium  Plus 600 with Vitamin D-3) °o Take 500 mg three (3) times a day for a total of 1500 mg each day °o Do not take all 3 doses of calcium at one time as it may cause constipation, and you can only absorb 500 mg at a time °o Do not mix multivitamins containing iron with calcium supplements;  take 2 hours apart °o Do not substitute Tums (calcium carbonate) for your calcium °• Menstruating women and those at risk for anemia ( a blood disease that causes weakness) may need extra iron °o Talk to your doctor to see if you need more iron °• If you need extra iron: Total daily Iron recommendation (including Vitamins) is 50 to 100 mg Iron/day °• Do not stop taking or change any vitamins or minerals until you talk to your nutritionist or surgeon °• Your nutritionist and/or surgeon must  approve all vitamin and mineral supplements °  °Activity and Exercise: It is important to continue walking at home. Limit your physical activity as instructed by your doctor. During this time, use these guidelines: °• Do not lift anything greater than ten  (10) pounds for at least two (2) weeks °• Do not go back to work or drive until your surgeon says you can °• You may have sex when you feel comfortable °o It is VERY important for female patients to use a reliable birth control method; fertility often increase after surgery °o Do not get pregnant for at least 18 months °• Start exercising as soon as your doctor tells you that you can °o Make sure your doctor approves any physical activity °• Start with a simple walking program °• Walk 5-15 minutes each day, 7 days per week °• Slowly increase until you are walking 30-45 minutes per day °• Consider joining our BELT program. [phone] or email [email or web address] °  °Special Instructions Things to remember: °• Free counseling is available for you and your family through collaboration between [facility] and [employer]. Please call [phone] and leave a message °• Use your CPAP when sleeping if this applies to you °• Consider buying a medical alert bracelet that says you had lap-band surgery °  °  You will likely have your first fill (fluid added to your band) 6 - 8 weeks after surgery °• [facility] has a free Bariatric Surgery Support Group that meets monthly, the 3rd Thursday, 6pm. [employer] [facility] Class[facility]. You can see classes online at [email or web address] °• It is very important to keep all follow up appointments with your surgeon, nutritionist, primary care physician, and behavioral health practitioner °o After the first year, please follow up with your bariatric surgeon and nutritionist at least once a year in order to maintain best weight loss results °      °             [facility]:  [phone] ° °             [facility]: [phone] ° °             Bariatric Nurse Coordinator: [phone]  °Gastric Bypass/Sleeve Home Care   Instructions  Rev. 08/2012    ° °                                                    Reviewed and Endorsed °                                                   by [facility] Patient Education Committee, Jan, 2014 ° ° ° ° ° °How and Where to Give Subcutaneous Enoxaparin Injections °Enoxaparin is an injectable medicine. It is used to help prevent blood clots from developing in your veins. Health care providers often use anticoagulants like enoxaparin to prevent clots following surgery. Enoxaparin is also used in combination with other medicines to treat blood clots and heart attacks. If blood clots are left untreated, they can be life threatening.  °Enoxaparin comes in single-use syringes. You inject enoxaparin through a syringe into your belly (abdomen). You should change the injection site each time you give yourself a shot. Continue the enoxaparin injections as directed by your health care provider. Your health care provider will use blood clotting test results to decide when you can safely stop using enoxaparin injections. If your health care provider prescribes any additional medicines, use the medicines exactly as directed. °HOW DO I INJECT ENOXAPARIN?  °1. Wash your hands with soap and water. °2. Clean the selected injection site as directed by your health care provider. °3. Remove the needle cap by pulling it straight off the syringe. °4. Hold the syringe like a pencil using your writing hand. °5. Use your other hand to pinch and hold an inch of the cleansed skin. °6. Insert the entire needle straight down into the fold of skin. °7. Push the plunger with your thumb until the syringe is empty. °8. Pull the needle straight out of your skin. °9. Enoxaparin injection prefilled syringes and graduated prefilled syringes are available with a system that shields the  needle after injection. After you have completed your injection and removed the needle from your skin, firmly push down on the plunger. The protective sleeve will automatically cover the needle and you will hear a click. The click means the needle is safely covered. °10. Place the syringe in the nearest needle box, also called a sharps container. If you do not have a sharps container, you can use a hard-sided plastic container with a secure lid, such as an empty laundry detergent bottle. °WHAT ELSE DO I NEED TO KNOW? °· Do not use enoxaparin if: °¨ You have allergies to heparin or pork products. °¨ You have been diagnosed with a condition called thrombocytopenia. °· Do not use the syringe or needle more than one time. °· Use medicines only as directed by your health care provider. °· Changes in medicines, supplements, diet, and illness can affect your anticoagulation therapy. Be sure to inform your health care provider of any of these changes. °· It is important that you tell all of your health care providers and your dentist that you are taking an anticoagulant, especially if you are injured or plan to have any type of procedure. °· While on anticoagulants, you will need to have blood tests done routinely as directed by   your health care provider. °· While using this medicine, avoid physical activities or sports that could result in a fall or cause injury. °· Follow up with your laboratory test and health care provider appointments as directed. It is very important to keep your appointments. Not keeping appointments could result in a chronic or permanent injury, pain, or disability. °· Before giving your medicine, you should make sure the injection is a clear and colorless or pale yellow solution. If your medicine becomes discolored or if there are particles in the syringe, do not use it and notify your health care provider. °· Keep your medicine safely stored at room temperature. °SEEK MEDICAL CARE IF: °· You  develop any rashes on your skin. °· You have large areas of bruising on your skin. °· You have any worsening of the condition for which you take Enoxaparin. °· You develop a fever. °SEEK IMMEDIATE MEDICAL CARE IF: °· You develop bleeding problems such as: °¨ Bleeding from the gums or nose that does not stop quickly. °¨ Vomiting blood or coughing up blood. °¨ Blood in your urine. °¨ Blood in your stool, or stool that has a dark, tarry, or coffee grounds appearance. °¨ A cut that does not stop bleeding within 10 minutes. °These symptoms may represent a serious problem that is an emergency. Do not wait to see if the symptoms will go away. Get medical help right away. Call your local emergency services (911 in the [place] Do not drive yourself to the hospital.  °  °This information is not intended to replace advice given to you by your health care provider. Make sure you discuss any questions you have with your health care provider. °  °Document Released: 04/21/2004 Document Revised: 07/11/2014 Document Reviewed: 12/05/2013 °Elsevier Interactive Patient Education ©2016 [employer] ° ° ° ° ° °

## 2016-02-29 NOTE — Anesthesia Procedure Notes (Signed)
Procedure Name: Intubation Date/Time: 02/29/2016 11:55 AM Performed by: Leroy LibmanEARDON, Emmelyn Schmale L Patient Re-evaluated:Patient Re-evaluated prior to inductionOxygen Delivery Method: Circle system utilized Preoxygenation: Pre-oxygenation with 100% oxygen Intubation Type: IV induction Ventilation: Mask ventilation without difficulty and Oral airway inserted - appropriate to patient size Laryngoscope Size: Hyacinth MeekerMiller and 2 Grade View: Grade I Tube type: Oral Tube size: 7.5 mm Number of attempts: 1 Airway Equipment and Method: Stylet Placement Confirmation: ETT inserted through vocal cords under direct vision,  positive ETCO2 and breath sounds checked- equal and bilateral Secured at: 21 cm Tube secured with: Tape Dental Injury: Teeth and Oropharynx as per pre-operative assessment

## 2016-02-29 NOTE — Op Note (Signed)
Name:  Lauren Clark Fonder MRN: [id number] Date of Surgery: 02/29/2016  Preop Diagnosis:  Morbid Obesity  Postop Diagnosis:  Morbid Obesity (Weight - 210, BMI - 41.1), S/P Gastric Sleeve  Procedure:  Upper endoscopy  (Intraoperative)  Surgeon:  Ovidio Kinavid Christalynn Boise, M.D.  Anesthesia:  GET  Indications for procedure: Lauren Clark Gallus is a 60 y.o. female whose primary care physician is Blane OharaOX,KIRSTEN, MD and has completed a Gastric Sleeve today by Dr. Andrey CampanileWilson.  I am doing an intraoperative upper endoscopy to evaluate the gastric pouch.  Operative Note: The patient is under general anesthesia.  Dr. Andrey CampanileWilson is laparoscoping the patient while I do an upper endoscopy to evaluate the stomach pouch.  With the patient intubated, I passed the Pentax upper endoscope without difficulty down the esophagus.  The esophago-gastric junction was at 39 cm.    The mucosa of the stomach looked viable and the staple line was intact without bleeding.  I advanced to the pylorus, but did not go through it.  While I insufflated the stomach pouch with air, Dr. Andrey CampanileWilson  flooded the upper abdomen with saline to put the gastric pouch under saline.  There was no bubbling or evidence of a leak.  There was no evidence of narrowing of the pouch and the gastric sleeve looked tubular.  The scope was then withdrawn.  The esophagus was unremarkable and the patient tolerated the endoscopy without difficulty.  Ovidio Kinavid Dianna Ewald, MD, [facility] Pager: [phone] Office phone:  [phone]

## 2016-02-29 NOTE — Progress Notes (Signed)
Dr called stating pt can have water/ice chips tonight.

## 2016-02-29 NOTE — Interval H&P Note (Signed)
History and Physical Interval Note:  02/29/2016 11:12 AM  Lauren Clark  has presented today for surgery, with the diagnosis of Morbid Obesity, HTN, Osteoarthritis  The various methods of treatment have been discussed with the patient and family. After consideration of risks, benefits and other options for treatment, the patient has consented to  Procedure(s): LAPAROSCOPIC GASTRIC SLEEVE RESECTION, UPPER ENDO (N/A) as a surgical intervention .  The patient's history has been reviewed, patient examined, no change in status, stable for surgery.  I have reviewed the patient's chart and labs.  Questions were answered to the patient's satisfaction.    Mary SellaEric Clark. Andrey CampanileWilson, MD, FACS General, Bariatric, & Minimally Invasive Surgery [facility], [place]  [facility] HospitalWILSON,Lauren Clark

## 2016-02-29 NOTE — Anesthesia Postprocedure Evaluation (Signed)
Anesthesia Post Note  Patient: Lauren Clark  Procedure(s) Performed: Procedure(s) (LRB): LAPAROSCOPIC GASTRIC SLEEVE RESECTION, UPPER ENDO (N/A)  Patient location during evaluation: PACU Anesthesia Type: General Level of consciousness: sedated Pain management: satisfactory to patient Vital Signs Assessment: post-procedure vital signs reviewed and stable Respiratory status: spontaneous breathing Cardiovascular status: stable Anesthetic complications: no    Last Vitals:  Vitals:   02/29/16 1530 02/29/16 1630  BP: 135/64 130/68  Pulse: 68 72  Resp: 18 18  Temp: 36.8 C 36.7 C    Last Pain:  Vitals:   02/29/16 1630  TempSrc: Oral  PainSc:                  [name],[name]

## 2016-02-29 NOTE — Op Note (Signed)
02/29/2016 Lyla Son [birth year] [id number]   PRE-OPERATIVE DIAGNOSIS:     Morbid obesity BMI 41   Hyperlipidemia   Essential hypertension   Prediabetes   Dyslipidemia   Degenerative joint disease of right hip  POST-OPERATIVE DIAGNOSIS:  same  PROCEDURE:  Procedure(s): LAPAROSCOPIC SLEEVE GASTRECTOMY  UPPER GI ENDOSCOPY  SURGEON:  Surgeon(s): Atilano Ina, MD FACS FASMBS  ASSISTANTS: Ovidio Kin MD FACS  ANESTHESIA:   general  DRAINS: none   BOUGIE: 40 fr ViSiGi  LOCAL MEDICATIONS USED:  MARCAINE + Exparel  SPECIMEN:  Source of Specimen:  Greater curvature of stomach  DISPOSITION OF SPECIMEN:  PATHOLOGY  COUNTS:  YES  INDICATION FOR PROCEDURE: This is a very pleasant 60 y.o.-year-old morbidly obese female who has had unsuccessful attempts for sustained weight loss. The patient presents today for a planned laparoscopic sleeve gastrectomy with upper endoscopy. We have discussed the risk and benefits of the procedure extensively preoperatively. Please see my separate notes.  PROCEDURE: After obtaining informed consent and receiving 5000 units of subcutaneous heparin, the patient was brought to the operating room at [facility] and placed supine on the operating room table. General endotracheal anesthesia was established. Sequential compression devices were placed. A orogastric tube was placed. The patient's abdomen was prepped and draped in the usual standard surgical fashion. The patient received preoperative IV antibiotic. A surgical timeout was performed.  Access to the abdomen was achieved using a 5 mm 0 laparoscope thru a 5 mm trocar In the left upper Quadrant 2 fingerbreadths below the left subcostal margin using the Optiview technique. Pneumoperitoneum was smoothly established up to 15 mm of mercury. The laparoscope was advanced and the abdominal cavity was surveilled. The patient was then placed in reverse Trendelenburg. There was no evidence of a hiatal  hernia on laparoscopy - gap in the left and right crus anteriorly.  A 5 mm trocar was placed slightly above and to the left of the umbilicus under direct visualization.  The [facility] liver retractor was placed under the left lobe of the liver through a 5 mm trocar incision site in the subxiphoid position. A 5 mm trocar was placed in the lateral right upper quadrant along with a 15 mm trocar in the mid right abdomen. A final 5 mm trocar was placed in the lateral LUQ.  All under direct visualization after local had been infiltrated.  The stomach was inspected. It was completely decompressed and the orogastric tube was removed.  There was no anterior dimple that was obviously visible. The calibration tube was placed in the oropharynx and guided down into the stomach by the CRNA. 10 mL of air was insufflated into the calibration balloon. The calibration tubing was then gently pulled back by the CRNA and it did not slide past the GE junction. At this point the calibration tubing was desufflated and pulled back into the esophagus and removed. This confirmed my suspicion of no clinically significant hiatal hernia. Her preop UGI showed no hiatal hernia.   We identified the pylorus and measured 6 cm proximal to the pylorus and identified an area of where we would start taking down the short gastric vessels. Harmonic scalpel was used to take down the short gastric vessels along the greater curvature of the stomach. We were able to enter the lesser sac. I got a little close to the stomach wall with the harmonic scalpel. We continued to march along the greater curvature of the stomach taking down the short gastrics. As we  approached the gastrosplenic ligament we took care in this area not to injure the spleen. We were able to take down the entire gastrosplenic ligament. We then mobilized the fundus away from the left crus of diaphragm. There were a few posterior gastric avascular attachments which were taken down. This  left the stomach completely mobilized. No vessels had been taken down along the lesser curvature of the stomach.  We then reidentified the pylorus. A 40Fr ViSiGi was then placed in the oropharynx and advanced down into the stomach and placed in the distal antrum and positioned along the lesser curvature. It was placed under suction which secured the 40Fr ViSiGi in place along the lesser curve. Then using the Ethicon echelon 60 mm stapler with a green load with Seamguard, I placed a stapler along the antrum approximately 5 cm from the pylorus. The stapler was angled so that there is ample room at the angularis incisura. I then fired the first staple load after inspecting it posteriorly to ensure adequate space both anteriorly and posteriorly. At this point I still was not completely past the angularis so with another green load with Seamguard, I placed the stapler in position just inside the prior stapleline. We then rotated the stomach to insure that there was adequate anteriorly as well as posteriorly. The stapler was then fired. At this point I started using 60 mm gold load staple cartridges with Seamguard. The echelon stapler was then repositioned with a 60 mm gold load with Seamguard and we continued to march up along the ViSiGi. My assistant was holding traction along the greater curvature stomach along the cauterized short gastric vessels ensuring that the stomach was symmetrically retracted. Prior to each firing of the staple, we rotated the stomach to ensure that there is adequate stomach left.  As we approached the fundus, I used 60 mm blue cartridge with Seamguard aiming slightly lateral to the esophageal fat pad. The sleeve was inspected. There is no evidence of cork screw. The staple line appeared hemostatic. In the antrum, where I had gotten close to the stomach I did place three interrupted 2-0 vicryl sutures to lembert the area. The CRNA inflated the ViSiGi to the green zone and the upper abdomen  was flooded with saline. There were no bubbles. The sleeve was decompressed and the ViSiGi removed. My assistant scrubbed out and performed an upper endoscopy. The sleeve easily distended with air and the scope was easily advanced to the pylorus. There is no evidence of internal bleeding or cork screwing. There was no narrowing at the angularis. There is no evidence of bubbles. Please see his operative note for further details. The gastric sleeve was decompressed and the endoscope was removed.  The greater curvature the stomach was grasped with a laparoscopic grasper and removed from the 15 mm trocar site.  The liver retractor was removed. I then closed the 15 mm trocar site with 1 interrupted 0 Vicryl sutures through the fascia using the endoclose. The closure was viewed laparoscopically and it was airtight. 70 cc of Exparel was then infiltrated in the preperitoneal spaces around the trocar sites. Pneumoperitoneum was released. All trocar sites were closed with a 4-0 Monocryl in a subcuticular fashion followed by the application of Dermabond. The patient was extubated and taken to the recovery room in stable condition. All needle, instrument, and sponge counts were correct x2. There are no immediate complications  (2) 60 mm green with Seamguard (1) 60 mm gold with seamguard (2) 60 mm blue with  seamguard  PLAN OF CARE: Admit to inpatient   PATIENT DISPOSITION:  PACU - hemodynamically stable.   Delay start of Pharmacological VTE agent (>24hrs) due to surgical blood loss or risk of bleeding:  no  Mary Sella. Andrey Campanile, MD, FACS FASMBS General, Bariatric, & Minimally Invasive Surgery [facility], [place]

## 2016-02-29 NOTE — H&P (Signed)
Lauren Clark 02/24/2016 11:45 AM Location: [facility] Patient #: [id number] DOB: [birth year] Widowed / Language: [place] / Race: White Female   History of Present Illness Minerva Areola M. Abrey Bradway MD; 02/25/2016 8:58 AM) Patient words: preop.  The patient is a 60 year old female who presents for a bariatric surgery evaluation. She comes in today for her preop appt. she has been approved for lap sleeve gastrectomy. She has started her preop diet. She denies any medical changes since I saw her about 6 weeks. denies trips to the ED/hospital or new medical problems. denies cp/sob/orthopnea/pnd/tia/doe. Her preop UGI showed only mild reflux o/w no hiatal hernia. CXR and mammogram were wnl. bariatric evaluations labs (cbc, cmet, lipids, vit levels) were all normal. Her A1C was 5.8. She saw Dr Rennis Golden with cardiology for clearance and was deemed low risk. still has fair amount of right hip issues and uses walker  12 point ROS - negative except for what is mentioned in HPI  01/08/2016 She is referred by Dr Blane Ohara for evaluation of weight loss surgery. She is interested in a sleeve gastrectomy. She attended our seminar in person. She is accompanied by her sister-in-law. The sleeve gastrectomy appeals to her because it does not involve adjustments like the lap band. She is also motivated for weight loss surgery because she wants to get her independence back. She wants to improve her overall health. She is also in need of a right hip replacement by Dr. Durene Romans however at her current body weight she is not a candidate. She has been working with her primary care physician monthly since September to address her body weight and her behavior modification. She has made significant efforts and improvement in her weight. She has lost about 50 pounds since September however her weight loss is stalling out. She is severely limited with physical activity because of right hip and knee pain. She uses a walker  24 7. She has tried prescription weight loss medications, physician supervised programs.  Her comorbidities include hypertension, prediabetes, degenerative joint disease of the right hip and the knee  She denies any chest pain, chest pressure, shortness of breath, dyspnea on exertion, orthopnea, paroxysmal nocturnal dyspnea, TIAs or amaurosis fugax. She does have some problems with edema in her left lower leg. She states after her second right knee surgery she states that she was told she had blood clot. However she denies having an ultrasound of the leg at that time. Moreover she states that she was not put on a blood thinner after discharge. So it is unclear whether or not she truly had a lower extremity DVT. Her Epworth Sleepiness Scale score was 3. She denies any heartburn, reflux, regurgitation. She denies any frequent abdominal pain. She reports a daily bowel movement. She denies any melena or hematochezia. She is had a hysterectomy. She denies any dysuria. She denies any hematuria. She is in need of a right hip replacement as discussed above. She had a right knee replacement when she was much heavier and had multiple complications. She ended up having to additional surgeries on that right knee. She states that she is prediabetic. She denies any migraines. She denies tobacco or alcohol use.  She states that she takes her blood pressure medication. She has not had a mammogram in 3-5 years. She denies a family history of cancer.     Problem List/Past Medical Minerva Areola Elson Clan, MD; 02/25/2016 9:00 AM) PREDIABETES (R73.03) PRIMARY OSTEOARTHRITIS OF RIGHT HIP (M16.11) HYPERTENSION, BENIGN (I10) OBESITY,  MORBID, BMI 40.0-49.9 (E66.01)  Other Problems Atilano Ina, MD; 02/25/2016 9:00 AM) Arthritis High blood pressure Thyroid Disease  Past Surgical History Atilano Ina, MD; 02/25/2016 9:00 AM) Hysterectomy (not due to cancer) - Complete Knee Surgery Right. Oral  Surgery Cataract Surgery Right.  Diagnostic Studies History Atilano Ina, MD; 02/25/2016 9:00 AM) Colonoscopy 1-5 years ago Mammogram 1-3 years ago  Allergies (Ammie Eversole, LPN; 0/98/1191 47:82 PM) Crestor *ANTIHYPERLIPIDEMICS* HydroCHLOROthiazide *DIURETICS* Lipitor *ANTIHYPERLIPIDEMICS*  Medication History Atilano Ina, MD; 02/25/2016 9:00 AM) TraMADol HCl (50MG  Tablet, Oral as needed) Active. Diltiazem CD (180MG  Capsule ER 24HR, Oral) Active. Levothyroxine Sodium ( Tablet, Oral daily) Active. Vitamin D (Ergocalciferol) (50000UNIT Capsule, Oral once a week) Active. Latanoprost (0.005% Solution, Ophthalmic at bedtime) Active. Magnesium Citrate (1.745GM/30ML Solution, Oral as needed) Active. FLUoxetine HCl (PMDD) (20MG  Capsule, Oral) Active. Rosuvastatin Calcium (5MG  Tablet, Oral daily) Active. Medications Reconciled OxyCODONE HCl (5MG /5ML Solution, 5-10 Milliliter Oral every four hours, as needed, Taken starting 02/24/2016) Active. Protonix (40MG  Tablet DR, 1 (one) Tablet Oral daily, Taken starting 02/24/2016) Active. Zofran ODT (4MG  Tablet Disint, 1 (one) Tablet Disperse Oral every six hours, as needed, Taken starting 02/24/2016) Active. Meloxicam (15MG  Tablet, Oral daily) Active.  Social History Atilano Ina, MD; 02/25/2016 9:00 AM) Caffeine use Carbonated beverages, Coffee, Tea. No drug use  Family History Atilano Ina, MD; 02/25/2016 9:00 AM) Arthritis Father, Mother. Depression Mother. Hypertension Father.  Pregnancy / Birth History Atilano Ina, MD; 02/25/2016 9:00 AM) Rhett Bannister 0 Para 0 Age at menarche 11 years.    Review of Systems Minerva Areola M. Brysan Mcevoy MD; 02/25/2016 8:54 AM) General Present- Weight Gain. Not Present- Appetite Loss, Chills, Fatigue, Fever, Night Sweats and Weight Loss. Skin Not Present- Change in Wart/Mole, Dryness, Hives, Jaundice, New Lesions, Non-Healing Wounds, Rash and Ulcer. HEENT Present- Wears  glasses/contact lenses. Not Present- Earache, Hearing Loss, Hoarseness, Nose Bleed, Oral Ulcers, Ringing in the Ears, Seasonal Allergies, Sinus Pain, Sore Throat, Visual Disturbances and Yellow Eyes. Respiratory Not Present- Bloody sputum, Chronic Cough, Difficulty Breathing, Snoring and Wheezing. Breast Not Present- Breast Mass, Breast Pain, Nipple Discharge and Skin Changes. Gastrointestinal Not Present- Abdominal Pain, Bloating, Bloody Stool, Change in Bowel Habits, Chronic diarrhea, Constipation, Difficulty Swallowing, Excessive gas, Gets full quickly at meals, Hemorrhoids, Indigestion, Nausea, Rectal Pain and Vomiting. Female Genitourinary Not Present- Frequency, Nocturia, Painful Urination, Pelvic Pain and Urgency. Neurological Present- Trouble walking. Not Present- Decreased Memory, Fainting, Headaches, Numbness, Seizures, Tingling, Tremor and Weakness. Endocrine Not Present- Cold Intolerance, Excessive Hunger, Hair Changes, Heat Intolerance, Hot flashes and New Diabetes. Hematology Not Present- Easy Bruising, Excessive bleeding, Gland problems, HIV and Persistent Infections.  Vitals (Ammie Eversole LPN; 9/56/2130 86:57 PM) 02/24/2016 12:00 PM Weight: 210.8 lb Height: 60in Body Surface Area: 1.91 m Body Mass Index: 41.17 kg/m  Temp.: 97.75F(Oral)  Pulse: 86 (Regular)  BP: 148/88 (Sitting, Left Arm, Standard)       Physical Exam Minerva Areola M. Narissa Beaufort MD; 02/25/2016 8:54 AM) General Mental Status-Alert. General Appearance-Consistent with stated age. Hydration-Well hydrated. Voice-Normal.  Head and Neck Head-normocephalic, atraumatic with no lesions or palpable masses. Trachea-midline. Thyroid Gland Characteristics - normal size and consistency.  Eye Eyeball - Bilateral-Extraocular movements intact. Sclera/Conjunctiva - Bilateral-No scleral icterus.  Chest and Lung Exam Chest and lung exam reveals -quiet, even and easy respiratory effort with no use  of accessory muscles and on auscultation, normal breath sounds, no adventitious sounds and normal vocal resonance. Inspection Chest Wall - Normal. Back - normal.  Breast - Did  not examine.  Cardiovascular Cardiovascular examination reveals -normal heart sounds, regular rate and rhythm with no murmurs and normal pedal pulses bilaterally. Note: morbidly obese, central truncal  Abdomen Inspection Inspection of the abdomen reveals - No Hernias. Skin - Scar - no surgical scars. Palpation/Percussion Palpation and Percussion of the abdomen reveal - Soft, Non Tender, No Rebound tenderness, No Rigidity (guarding) and No hepatosplenomegaly. Auscultation Auscultation of the abdomen reveals - Bowel sounds normal.  Peripheral Vascular Upper Extremity Palpation - Pulses bilaterally normal.  Neurologic Neurologic evaluation reveals -alert and oriented x 3 with no impairment of recent or remote memory. Mental Status-Normal.  Neuropsychiatric The patient's mood and affect are described as -normal. Judgment and Insight-insight is appropriate concerning matters relevant to self.  Musculoskeletal Normal Exam - Left-Upper Extremity Strength Normal and Lower Extremity Strength Normal. Normal Exam - Right-Upper Extremity Strength Normal and Lower Extremity Strength Normal. Note: old right knee scar; b/l knee crepitus   Lymphatic Head & Neck  General Head & Neck Lymphatics: Bilateral - Description - Normal. Axillary - Did not examine. Femoral & Inguinal - Did not examine.    Assessment & Plan Minerva Areola(Jajaira Ruis M. Tyyonna Soucy MD; 02/25/2016 9:00 AM) OBESITY, MORBID, BMI 40.0-49.9 (E66.01) Impression: we reviewed her preop workup and labs. no red flags. she was given her postop pain, nausea and heartburn. she would like to use ultram on discharge and we will try that but i did give her a small amount of oxycodone in case ultram not sufficient for postop pain control after dc from hospital. we  discussed her medications and preop diet. we discussed typical post op course. all of her questions were asked and answered. we discussed postop support groups. I asked her to go ahead and schedule a f/u appt with her pcp within 1 month of surgery. Current Plans Pt Education - EMW_preopbariatric Started OxyCODONE HCl 5MG /5ML, 5-10 Milliliter every four hours, as needed, 100 Milliliter, 02/24/2016, No Refill. Started Protonix 40MG , 1 (one) Tablet daily, #30, 30 days starting 02/24/2016, Ref. x3. Started Zofran ODT 4MG , 1 (one) Tablet Disperse every six hours, as needed, #20, 02/24/2016, No Refill. HYPERTENSION, BENIGN (I10) PRIMARY OSTEOARTHRITIS OF RIGHT HIP (M16.11) PREDIABETES (R73.03)  Mary SellaEric M. Andrey CampanileWilson, MD, FACS General, Bariatric, & Minimally Invasive Surgery [facility], [place]

## 2016-02-29 NOTE — Transfer of Care (Signed)
Immediate Anesthesia Transfer of Care Note  Patient: Lauren Clark  Procedure(s) Performed: Procedure(s): LAPAROSCOPIC GASTRIC SLEEVE RESECTION, UPPER ENDO (N/A)  Patient Location: PACU  Anesthesia Type:General  Level of Consciousness: awake  Airway & Oxygen Therapy: Patient Spontanous Breathing and Patient connected to face mask oxygen  Post-op Assessment: Report given to RN and Post -op Vital signs reviewed and stable  Post vital signs: Reviewed and stable  Last Vitals:  Vitals:   02/29/16 0705  BP: (!) 129/59  Pulse: 80  Resp: 18  Temp: 36.7 C    Last Pain:  Vitals:   02/29/16 0705  TempSrc: Oral      Patients Stated Pain Goal: 4 (02/29/16 1039)  Complications: No apparent anesthesia complications

## 2016-03-01 LAB — COMPREHENSIVE METABOLIC PANEL
ALT: 49 U/L (ref 14–54)
AST: 27 U/L (ref 15–41)
Albumin: 2.8 g/dL — ABNORMAL LOW (ref 3.5–5.0)
Alkaline Phosphatase: 90 U/L (ref 38–126)
Anion gap: 7 (ref 5–15)
BILIRUBIN TOTAL: 0.4 mg/dL (ref 0.3–1.2)
BUN: 26 mg/dL — AB (ref 6–20)
CALCIUM: 8.6 mg/dL — AB (ref 8.9–10.3)
CHLORIDE: 103 mmol/L (ref 101–111)
CO2: 25 mmol/L (ref 22–32)
CREATININE: 1.07 mg/dL — AB (ref 0.44–1.00)
GFR, EST NON AFRICAN AMERICAN: 55 mL/min — AB (ref >60)
Glucose, Bld: 205 mg/dL — ABNORMAL HIGH (ref 65–99)
Potassium: 4.6 mmol/L (ref 3.5–5.1)
Sodium: 135 mmol/L (ref 135–145)
TOTAL PROTEIN: 6.7 g/dL (ref 6.5–8.1)

## 2016-03-01 LAB — CBC WITH DIFFERENTIAL/PLATELET
Basophils Absolute: 0 10*3/uL (ref 0.0–0.1)
Basophils Relative: 0 %
EOS PCT: 0 %
Eosinophils Absolute: 0 10*3/uL (ref 0.0–0.7)
HEMATOCRIT: 35.2 % — AB (ref 36.0–46.0)
Hemoglobin: 11.8 g/dL — ABNORMAL LOW (ref 12.0–15.0)
LYMPHS ABS: 0.5 10*3/uL — AB (ref 0.7–4.0)
LYMPHS PCT: 4 %
MCH: 30.8 pg (ref 26.0–34.0)
MCHC: 33.5 g/dL (ref 30.0–36.0)
MCV: 91.9 fL (ref 78.0–100.0)
MONO ABS: 0.4 10*3/uL (ref 0.1–1.0)
Monocytes Relative: 4 %
NEUTROS ABS: 9.9 10*3/uL — AB (ref 1.7–7.7)
Neutrophils Relative %: 92 %
PLATELETS: 287 10*3/uL (ref 150–400)
RBC: 3.83 MIL/uL — AB (ref 3.87–5.11)
RDW: 13.2 % (ref 11.5–15.5)
WBC: 10.7 10*3/uL — ABNORMAL HIGH (ref 4.0–10.5)

## 2016-03-01 LAB — HEMOGLOBIN AND HEMATOCRIT, BLOOD
HEMATOCRIT: 35.7 % — AB (ref 36.0–46.0)
HEMOGLOBIN: 11.8 g/dL — AB (ref 12.0–15.0)

## 2016-03-01 MED ORDER — TRAMADOL HCL 50 MG PO TABS: 50.0000 mg | ORAL_TABLET | Freq: Four times a day (QID) | ORAL | Status: DC | PRN

## 2016-03-01 MED ORDER — TRAMADOL 5 MG/ML ORAL SUSPENSION: 50.0000 mg | Freq: Four times a day (QID) | ORAL | Status: DC | PRN

## 2016-03-01 MED ORDER — SIMETHICONE 40 MG/0.6ML PO SUSP
40.0000 mg | Freq: Four times a day (QID) | ORAL | Status: DC | PRN
  Filled 2016-03-01: qty 0.6

## 2016-03-01 MED ORDER — DILTIAZEM HCL ER COATED BEADS 180 MG PO CP24
180.0000 mg | ORAL_CAPSULE | Freq: Every day | ORAL | Status: DC
  Administered 2016-03-01: 180 mg via ORAL
  Filled 2016-03-01: qty 1

## 2016-03-01 MED ORDER — ENOXAPARIN (LOVENOX) PATIENT EDUCATION KIT
PACK | Freq: Once | Status: AC
  Administered 2016-03-01: 14:00:00
  Filled 2016-03-01: qty 1

## 2016-03-01 NOTE — Plan of Care (Signed)
Problem: Food- and Nutrition-Related Knowledge Deficit (NB-1.1) Goal: Nutrition education Formal process to instruct or train a patient/client in a skill or to impart knowledge to help patients/clients voluntarily manage or modify food choices and eating behavior to maintain or improve health. Outcome: Completed/Met Date Met: 03/01/16 Nutrition Education Note  Received consult for diet education per DROP protocol.   Discussed 2 week post op diet with pt. Emphasized that liquids must be non carbonated, non caffeinated, and sugar free. Fluid goals discussed. Reviewed progression of diet to include soft proteins at 7-10 days post-op. Pt to follow up with outpatient bariatric RD for further diet progression after 2 weeks. Multivitamins and minerals also reviewed. Teach back method used, pt expressed understanding, expect good compliance.   Diet: First 2 Weeks  You will see the dietitian about two (2) weeks after your surgery. The dietitian will increase the types of foods you can eat if you are handling liquids well:  If you have severe vomiting or nausea and cannot handle clear liquids lasting longer than 1 day, call your surgeon  Protein Shake  Drink at least 2 ounces of shake 5-6 times per day  Each serving of protein shakes (usually 8 - 12 ounces) should have a minimum of:  15 grams of protein  And no more than 5 grams of carbohydrate  Goal for protein each day:  Men = 80 grams per day  Women = 60 grams per day  Protein powder may be added to fluids such as non-fat milk or Lactaid milk or Soy milk (limit to 35 grams added protein powder per serving)   Hydration  Slowly increase the amount of water and other clear liquids as tolerated (See Acceptable Fluids)  Slowly increase the amount of protein shake as tolerated  Sip fluids slowly and throughout the day  May use sugar substitutes in small amounts (no more than 6 - 8 packets per day; i.e. Splenda)   Fluid Goal  The first goal is to  drink at least 8 ounces of protein shake/drink per day (or as directed by the nutritionist); some examples of protein shakes are Syntrax Nectar, Adkins Advantage, EAS Edge HP, and Unjury. See handout from pre-op Bariatric Education Class:  Slowly increase the amount of protein shake you drink as tolerated  You may find it easier to slowly sip shakes throughout the day  It is important to get your proteins in first  Your fluid goal is to drink 64 - 100 ounces of fluid daily  It may take a few weeks to build up to this  32 oz (or more) should be clear liquids  And  32 oz (or more) should be full liquids (see below for examples)  Liquids should not contain sugar, caffeine, or carbonation   Clear Liquids:  Water or Sugar-free flavored water (i.e. Fruit H2O, Propel)  Decaffeinated coffee or tea (sugar-free)  Crystal Lite, Wyler's Lite, Minute Maid Lite  Sugar-free Jell-O  Bouillon or broth  Sugar-free Popsicle: *Less than 20 calories each; Limit 1 per day   Full Liquids:  Protein Shakes/Drinks + 2 choices per day of other full liquids  Full liquids must be:  No More Than 12 grams of Carbs per serving  No More Than 3 grams of Fat per serving  Strained low-fat cream soup  Non-Fat milk  Fat-free Lactaid Milk  Sugar-free yogurt (Dannon Lite & Fit, Greek yogurt)     Lindsey Baker, MS, RD, LDN Pager: [phone] After Hours Pager: [phone]    

## 2016-03-01 NOTE — Progress Notes (Signed)
1 Day Post-Op  Subjective: Doing well. No n/v. Ambulated. Tolerating shake. Doesn't have anyone to stay with this evening.   Objective: Vital signs in last 24 hours: Temp:  [97.8 F (36.6 C)-98.5 F (36.9 C)] 97.8 F (36.6 C) (08/29 0619) Pulse Rate:  [59-76] 59 (08/29 0619) Resp:  [15-24] 17 (08/29 0619) BP: (117-152)/(59-88) 149/73 (08/29 0619) SpO2:  [94 %-100 %] 98 % (08/29 0619) Last BM Date: 02/28/16  Intake/Output from previous day: 08/28 0701 - 08/29 0700 In: 3227.1 [I.V.:3077.1; IV Piggyback:150] Out: 1650 [Urine:1600; Blood:50] Intake/Output this shift: Total I/O In: -  Out: 800 [Urine:800]  Alert, nontoxic, sitting in chair cta b/l Reg Soft, obese, incisions c/d/i, not really tender No edema. +SCD  Lab Results:   Recent Labs  02/29/16 1451 03/01/16 0448  WBC  --  10.7*  HGB 11.5* 11.8*  HCT 35.2* 35.2*  PLT  --  287   BMET  Recent Labs  03/01/16 0448  NA 135  K 4.6  CL 103  CO2 25  GLUCOSE 205*  BUN 26*  CREATININE 1.07*  CALCIUM 8.6*   PT/INR No results for input(s): LABPROT, INR in the last 72 hours. ABG No results for input(s): PHART, HCO3 in the last 72 hours.  Invalid input(s): PCO2, PO2  Studies/Results: No results found.  Anti-infectives: Anti-infectives    Start     Dose/Rate Route Frequency Ordered Stop   02/29/16 0648  cefoTEtan in Dextrose 5% (CEFOTAN) IVPB 2 g     2 g Intravenous On call to O.R. 02/29/16 [id number] 02/29/16 1158      Assessment/Plan: s/p Procedure(s): LAPAROSCOPIC GASTRIC SLEEVE RESECTION, UPPER ENDO (N/A)  No tachycardia. No fever. Looks well.  Cont POD 2 diet Cont chemical vte prophylaxis.  Will do home lovenox given mobility issues. PT has seen and evaluated Resume home BP meds  The patient does not meet criteria for discharge because she has poor mobility and require further assistance to decrease thrombosis risk also her home care team will not be in place til wed am  Mary SellaEric M. Andrey CampanileWilson, MD,  FACS General, Bariatric, & Minimally Invasive Surgery [facility], [place]    LOS: 1 day    Atilano InaWILSON,Michae Grimley M 03/01/2016

## 2016-03-01 NOTE — Evaluation (Signed)
Occupational Therapy Evaluation Patient Details Name: Lauren Clark MRN: [id number] DOB: Aug 31, 1955 Today's Date: 03/01/2016        Clinical Impression   Pt admitted for gastric sleeve. Pt currently with functional limitations due to the deficits listed below (see OT Problem List).  Pt will benefit from skilled OT to increase their safety and independence with ADL and functional mobility for ADL to facilitate discharge to venue listed below.      Follow Up Recommendations  Home health OT    Equipment Recommendations  None recommended by OT    Recommendations for Other Services       Precautions / Restrictions        Mobility Bed Mobility                  Transfers                      Balance                                            ADL Overall ADL's : Needs assistance/impaired Eating/Feeding: Independent   Grooming: Supervision/safety   Upper Body Bathing: Set up   Lower Body Bathing: Minimal assistance;Sit to/from stand   Upper Body Dressing : Set up;Sitting   Lower Body Dressing: Minimal assistance;Sit to/from stand Lower Body Dressing Details (indicate cue type and reason): pt has a Sports administratorreacher she uses StatisticianToilet Transfer: Education officer, environmentalMin guard;RW                   Vision     Perception     Praxis      Pertinent Vitals/Pain       Hand Dominance     Extremity/Trunk Assessment             Communication Communication Communication: No difficulties   Cognition Arousal/Alertness: Awake/alert Behavior During Therapy: WFL for tasks assessed/performed Overall Cognitive Status: Within Functional Limits for tasks assessed                     General Comments       Exercises       Shoulder Instructions      Home Living Family/patient expects to be discharged to:: Private residence Living Arrangements: Alone Available Help at Discharge: Family;Friend(s) Type of Home: House Home Access: Stairs to  enter Entergy CorporationEntrance Stairs-Number of Steps: 5 Entrance Stairs-Rails: Right;Left Home Layout: One level               Home Equipment: Walker - 4 wheels;Cane - quad          Prior Functioning/Environment Level of Independence: Independent with assistive device(s)             OT Diagnosis: Generalized weakness   OT Problem List: Decreased strength   OT Treatment/Interventions: Self-care/ADL training    OT Goals(Current goals can be found in the care plan section) Acute Rehab OT Goals Patient Stated Goal: to get my hip fixed OT Goal Formulation: With patient Time For Goal Achievement: 03/08/16 Potential to Achieve Goals: Good  OT Frequency: Min 2X/week   Barriers to D/C:            Co-evaluation              End of Session Nurse Communication: Mobility status  Activity Tolerance: Patient tolerated treatment well Patient left:  in chair   Time: [phone] OT Time Calculation (min): 29 min Charges:  OT General Charges $OT Visit: 1 Procedure OT Evaluation $OT Eval Moderate Complexity: 1 Procedure OT Treatments $Self Care/Home Management : 8-22 mins G-Codes:    Einar Crow D 03/20/16, 3:12 PM

## 2016-03-01 NOTE — Evaluation (Signed)
Physical Therapy Evaluation Patient Details Name: Lauren Clark MRN: [id number] DOB: [birth year] Today's Date: 03/01/2016   History of Present Illness  LAPAROSCOPIC SLEEVE GASTRECTOMY on 02/29/16  Clinical Impression  The patient is very pleasant and motivated. Ambulatedx 840' w/ RW. Plans DC  [place] with family/neighbor assistance. Pt admitted with above diagnosis. Pt currently with functional limitations due to the deficits listed below (see PT Problem List). Pt will benefit from skilled PT to increase their independence and safety with mobility to allow discharge to the venue listed below.      Follow Up Recommendations No PT follow up    Equipment Recommendations  None recommended by PT    Recommendations for Other Services       Precautions / Restrictions Precautions Precautions: Fall Precaution Comments: has DJD R hip Restrictions Weight Bearing Restrictions: No      Mobility  Bed Mobility               General bed mobility comments: inrecliner  Transfers Overall transfer level: Needs assistance Equipment used: 4-wheeled walker Transfers: Sit to/from Stand Sit to Stand: Modified independent (Device/Increase time)            Ambulation/Gait Ambulation/Gait assistance: Supervision Ambulation Distance (Feet): 840 Feet Assistive device: 4-wheeled walker Gait Pattern/deviations: Step-through pattern;Trunk flexed Gait velocity: slow   General Gait Details: leans on the  handles of the RW  Stairs            Wheelchair Mobility    Modified Rankin (Stroke Patients Only)       Balance                                             Pertinent Vitals/Pain Pain Assessment: No/denies pain    Home Living Family/patient expects to be discharged to:: Private residence Living Arrangements: Alone Available Help at Discharge: Family;Friend(s) Type of Home: House Home Access: Stairs to enter Entrance Stairs-Rails: Conservation officer, historic buildings of Steps: 5 Home Layout: One level Home Equipment: Environmental consultant - 4 wheels;Cane - quad      Prior Function Level of Independence: Independent with assistive device(s)               Hand Dominance        Extremity/Trunk Assessment   Upper Extremity Assessment: Generalized weakness           Lower Extremity Assessment: Generalized weakness         Communication   Communication: No difficulties  Cognition Arousal/Alertness: Awake/alert Behavior During Therapy: WFL for tasks assessed/performed Overall Cognitive Status: Within Functional Limits for tasks assessed                      General Comments      Exercises        Assessment/Plan    PT Assessment Patient needs continued PT services  PT Diagnosis Generalized weakness;Difficulty walking   PT Problem List Decreased strength;Decreased activity tolerance  PT Treatment Interventions DME instruction;Gait training;Stair training;Functional mobility training;Therapeutic activities;Therapeutic exercise;Patient/family education   PT Goals (Current goals can be found in the Care Plan section) Acute Rehab PT Goals Patient Stated Goal: to get my hip fixed PT Goal Formulation: With patient Time For Goal Achievement: 03/08/16 Potential to Achieve Goals: Good    Frequency Min 3X/week   Barriers to discharge        Co-evaluation  End of Session   Activity Tolerance: Patient tolerated treatment well Patient left: in chair;with call bell/phone within reach Nurse Communication: Mobility status         Time: [id number] PT Time Calculation (min) (ACUTE ONLY): 22 min   Charges:   PT Evaluation $PT Eval Low Complexity: 1 Procedure     PT G CodesRada Hay:        Blessing Ozga Elizabeth 03/01/2016, 9:07 AM Blanchard KelchKaren Ida Uppal PT [phone]

## 2016-03-01 NOTE — Progress Notes (Signed)
Patient alert and oriented, Post op day 1.  Provided support and encouragement.  Encouraged pulmonary toilet, ambulation and small sips of liquids.  All questions answered.  Will continue to monitor. 

## 2016-03-02 LAB — CBC WITH DIFFERENTIAL/PLATELET
BASOS PCT: 0 %
Basophils Absolute: 0 10*3/uL (ref 0.0–0.1)
EOS PCT: 0 %
Eosinophils Absolute: 0 10*3/uL (ref 0.0–0.7)
HEMATOCRIT: 37.8 % (ref 36.0–46.0)
Hemoglobin: 12.6 g/dL (ref 12.0–15.0)
LYMPHS ABS: 1.1 10*3/uL (ref 0.7–4.0)
Lymphocytes Relative: 7 %
MCH: 30.3 pg (ref 26.0–34.0)
MCHC: 33.3 g/dL (ref 30.0–36.0)
MCV: 90.9 fL (ref 78.0–100.0)
MONO ABS: 2 10*3/uL — AB (ref 0.1–1.0)
MONOS PCT: 12 %
Neutro Abs: 13.2 10*3/uL — ABNORMAL HIGH (ref 1.7–7.7)
Neutrophils Relative %: 81 %
PLATELETS: ADEQUATE 10*3/uL (ref 150–400)
RBC: 4.16 MIL/uL (ref 3.87–5.11)
RDW: 13.4 % (ref 11.5–15.5)
WBC: 16.3 10*3/uL — ABNORMAL HIGH (ref 4.0–10.5)

## 2016-03-02 MED ORDER — TRAMADOL HCL 50 MG PO TABS: 50.0000 mg | ORAL_TABLET | Freq: Four times a day (QID) | ORAL | 0 refills | Status: DC | PRN

## 2016-03-02 MED ORDER — ENOXAPARIN SODIUM 40 MG/0.4ML ~~LOC~~ SOLN: 40.0000 mg | SUBCUTANEOUS | 0 refills | Status: DC

## 2016-03-02 NOTE — Progress Notes (Signed)
Discharge planning, spoke with patient at beside. Original request for OT, this is not a stand alone service. [facility] and safety evaluation. She was agreeable. Discussed with staff and attending, order udated. [facility], contacted them for referral. Faxed Demographics, H&P, Op note, and d/c summary to [phone] per request. [phone]

## 2016-03-02 NOTE — Progress Notes (Signed)
Occupational Therapy Treatment Patient Details Name: Lauren Clark MRN: [id number] DOB: 1955/08/21 Today's Date: 03/02/2016    History of present illness LAPAROSCOPIC SLEEVE GASTRECTOMY on 02/29/16   OT comments  Patient progressing well towards OT goals. States she is going home today.  Follow Up Recommendations  Home health OT    Equipment Recommendations  None recommended by OT    Recommendations for Other Services      Precautions / Restrictions Precautions Precautions: Fall Restrictions Weight Bearing Restrictions: No       Mobility Bed Mobility               General bed mobility comments: inrecliner  Transfers Overall transfer level: Needs assistance Equipment used: 4-wheeled walker Transfers: Sit to/from Stand Sit to Stand: Supervision;Modified independent (Device/Increase time)         General transfer comment: cues to lock brakes on Rollator    Balance                                   ADL Overall ADL's : Needs assistance/impaired Eating/Feeding: Independent   Grooming: Wash/dry hands;Wash/dry face;Supervision/safety;Sitting;Standing   Upper Body Bathing: Set up;Sitting   Lower Body Bathing: Supervison/ safety;Sit to/from stand   Upper Body Dressing : Set up;Sitting   Lower Body Dressing: Minimal assistance;Sit to/from stand Lower Body Dressing Details (indicate cue type and reason): pt has a reacher she uses Toilet Transfer: Firefighterupervision/safety;RW Toilet Transfer Details (indicate cue type and reason): Rollator Toileting- Clothing Manipulation and Hygiene: Supervision/safety;Sit to/from stand       Functional mobility during ADLs: Supervision/safety;Rolling walker (Rollator) General ADL Comments: Patient received up in recliner; reports she is going home today. She slept in recliner last night. Patient practiced toilet transfer/toileting, grooming and bathing at sink, getting dressed during session. Nurse in to assist with  IV management during UB self-care. Patient sat up in recliner at end of session.       Vision                     Perception     Praxis      Cognition   Behavior During Therapy: WFL for tasks assessed/performed Overall Cognitive Status: Within Functional Limits for tasks assessed                       Extremity/Trunk Assessment               Exercises     Shoulder Instructions       General Comments      Pertinent Vitals/ Pain       Pain Assessment: No/denies pain  Home Living                                          Prior Functioning/Environment              Frequency Min 2X/week     Progress Toward Goals  OT Goals(current goals can now be found in the care plan section)  Progress towards OT goals: Progressing toward goals     Plan Discharge plan remains appropriate    Co-evaluation                 End of Session Equipment Utilized During Treatment: Rolling walker (Rollator)   Activity Tolerance  Patient tolerated treatment well   Patient Left in chair;with call bell/phone within reach   Nurse Communication Mobility status        Time: 1610-9604 OT Time Calculation (min): 30 min  Charges: OT General Charges $OT Visit: 1 Procedure OT Treatments $Self Care/Home Management : 23-37 mins  Lauren Clark A 03/02/2016, 12:57 PM

## 2016-03-02 NOTE — Discharge Summary (Signed)
Physician Discharge Summary  Lauren Clark [id number] DOB: Nov 05, 1955 DOA: 02/29/2016  PCP: Lauren Clark  Admit date: 02/29/2016 Discharge date: 03/02/2016  Recommendations for Outpatient Follow-up:  1. Homehealth OT  Follow-up Information    Lauren Clark. Go on 03/23/2016.   Specialty:  General Surgery Why:  at 11:30 AM for post-op check Contact information: [address] [place] [address] [phone]        Lauren Clark .   Specialty:  General Surgery Contact information: [address] [place] [address] [phone]          Discharge Diagnoses:  Principal Problem:   Morbid obesity due to excess calories (HCC) Active Problems:   Hyperlipidemia   Essential hypertension   Prediabetes   Dyslipidemia   Degenerative joint disease of right hip   S/P laparoscopic sleeve gastrectomy   Surgical Procedure: Laparoscopic Sleeve Gastrectomy, upper endoscopy  Discharge Condition: Good Disposition: Home  Diet recommendation: Postoperative sleeve gastrectomy diet (liquids only)  Filed Weights   02/29/16 0844 03/01/16 1754  Weight: 94.8 kg (209 lb) 96.5 kg (212 lb 11.2 oz)     Hospital Course:  The patient was admitted for a planned laparoscopic sleeve gastrectomy. Please see operative note. Preoperatively the patient was given 5000 units of subcutaneous heparin for DVT prophylaxis. Postoperative prophylactic Lovenox dosing was started on the morning of postoperative day 1.  The patient was started on ice chips and water which they tolerated. On postoperative day 1 The patient's diet was advanced to protein shakes which they also tolerated. The patient was ambulating at her baseline. PT/OT were consulted.  Their vital signs are stable without fever or tachycardia. Their hemoglobin had remained stable. The patient had received discharge instructions and counseling. They were deemed stable for discharge. On POD 2 she was doing  well. Tolerating shakes. No fever. No tachycardia. Looks great. No nausea. Minimal pain.   BP 128/69 (BP Location: Right Arm)   Pulse 63   Temp 98.3 F (36.8 C) (Oral)   Resp 18   Ht 5' (1.524 m)   Wt 96.5 kg (212 lb 11.2 oz)   SpO2 99%   BMI 41.54 kg/m   Gen: alert, NAD, non-toxic appearing Pupils: equal, no scleral icterus Pulm: Lungs clear to auscultation, symmetric chest rise CV: regular rate and rhythm Abd: soft, nontender, nondistended. Well-healed trocar sites. No cellulitis. No incisional hernia Ext: no edema, no calf tenderness Skin: no rash, no jaundice   Discharge Instructions  Discharge Instructions    Ambulate hourly while awake    Complete by:  As directed   Call Clark for:  difficulty breathing, headache or visual disturbances    Complete by:  As directed   Call Clark for:  persistant dizziness or light-headedness    Complete by:  As directed   Call Clark for:  persistant nausea and vomiting    Complete by:  As directed   Call Clark for:  redness, tenderness, or signs of infection (pain, swelling, redness, odor or green/yellow discharge around incision site)    Complete by:  As directed   Call Clark for:  severe uncontrolled pain    Complete by:  As directed   Call Clark for:  temperature >101 F    Complete by:  As directed   Diet bariatric full liquid    Complete by:  As directed   Discharge instructions    Complete by:  As directed   See bariatric  discharge instructions   Incentive spirometry    Complete by:  As directed   Perform hourly while awake       Medication List    STOP taking these medications   aspirin EC 81 MG tablet   BELVIQ 10 MG Tabs Generic drug:  Lorcaserin HCl   meloxicam 15 MG tablet Commonly known as:  MOBIC   phentermine 37.5 MG capsule     TAKE these medications   diltiazem 180 MG 24 hr capsule Commonly known as:  CARDIZEM CD Take 180 mg by mouth daily. nightly Notes to patient:  Monitor Blood Pressure Daily and keep a log for primary  care physician.  You may need to make changes to your medications with rapid weight loss.     enoxaparin 40 MG/0.4ML injection Commonly known as:  LOVENOX Inject 0.4 mLs (40 mg total) into the skin daily.   FLUoxetine 20 MG capsule Commonly known as:  PROZAC Take 20 mg by mouth 2 (two) times daily.   furosemide 20 MG tablet Commonly known as:  LASIX Take 20 mg by mouth daily. Notes to patient:  Monitor Blood Pressure Daily and keep a log for primary care physician.  Monitor for symptoms of dehydration.  You may need to make changes to your medications with rapid weight loss.     glucosamine-chondroitin 500-400 MG tablet Take 1 tablet by mouth 3 (three) times daily.   latanoprost 0.005 % ophthalmic solution Commonly known as:  XALATAN Place 1 drop into the right eye at bedtime.   levothyroxine 75 MCG tablet Commonly known as:  SYNTHROID, LEVOTHROID Take 75 mcg by mouth daily before breakfast.   rosuvastatin 5 MG tablet Commonly known as:  CRESTOR Take 5 mg by mouth daily. nightly   traMADol 50 MG tablet Commonly known as:  ULTRAM Take 1 tablet (50 mg total) by mouth every 6 (six) hours as needed for moderate pain.   Vitamin D (Ergocalciferol) 50000 units Caps capsule Commonly known as:  DRISDOL Take 50,000 Units by mouth every 7 (seven) days.      Follow-up Information    Lauren Clark. Go on 03/23/2016.   Specialty:  General Surgery Why:  at 11:30 AM for post-op check Contact information: [address] [place] [address] [phone]        Lauren Clark .   Specialty:  General Surgery Contact information: [address] [place] [address] [phone]            The results of significant diagnostics from this hospitalization (including imaging, microbiology, ancillary and laboratory) are listed below for reference.    Significant Diagnostic Studies: No results found.  Labs: Basic Metabolic Panel:  Recent  Labs Lab 02/24/16 1345 03/01/16 0448  NA 138 135  K 4.6 4.6  CL 101 103  CO2 27 25  GLUCOSE 107* 205*  BUN 30* 26*  CREATININE 1.28* 1.07*  CALCIUM 9.6 8.6*   Liver Function Tests:  Recent Labs Lab 02/24/16 1345 03/01/16 0448  AST 21 27  ALT 20 49  ALKPHOS 118 90  BILITOT 0.5 0.4  PROT 7.8 6.7  ALBUMIN 4.4 2.8*    CBC:  Recent Labs Lab 02/24/16 1345 02/29/16 1451 03/01/16 0448 03/01/16 1559 03/02/16 0515  WBC 11.0*  --  10.7*  --  16.3*  NEUTROABS 8.8*  --  9.9*  --  13.2*  HGB 14.1 11.5* 11.8* 11.8* 12.6  HCT 42.9 35.2* 35.2* 35.7* 37.8  MCV 91.9  --  91.9  --  90.9  PLT 324  --  287  --  PLATELET CLUMPS NOTED ON SMEAR, COUNT APPEARS ADEQUATE    CBG: No results for input(s): GLUCAP in the last 168 hours.  Principal Problem:   Morbid obesity due to excess calories (HCC) Active Problems:   Hyperlipidemia   Essential hypertension   Prediabetes   Dyslipidemia   Degenerative joint disease of right hip   S/P laparoscopic sleeve gastrectomy   Time coordinating discharge: 15 minutes  Signed:  Atilano Ina, Clark [facility], [place] [phone] 03/02/2016, 8:21 AM

## 2016-03-02 NOTE — Progress Notes (Signed)
Patient alert and oriented, pain is controlled. Patient is tolerating fluids, advanced to protein shake yesterday, patient is tolerating well.  Reviewed Gastric sleeve discharge instructions with patient and patient is able to articulate understanding.  Provided information on BELT program, Support Group and [facility] outpatient pharmacy. All questions answered, will continue to monitor.  

## 2016-03-04 DIAGNOSIS — Z48815 Encounter for surgical aftercare following surgery on the digestive system: Secondary | ICD-10-CM | POA: Diagnosis not present

## 2016-03-04 DIAGNOSIS — I10 Essential (primary) hypertension: Secondary | ICD-10-CM | POA: Diagnosis not present

## 2016-03-04 DIAGNOSIS — Z7901 Long term (current) use of anticoagulants: Secondary | ICD-10-CM | POA: Diagnosis not present

## 2016-03-04 DIAGNOSIS — Z602 Problems related to living alone: Secondary | ICD-10-CM | POA: Diagnosis not present

## 2016-03-04 DIAGNOSIS — Z86718 Personal history of other venous thrombosis and embolism: Secondary | ICD-10-CM | POA: Diagnosis not present

## 2016-03-04 DIAGNOSIS — Z79891 Long term (current) use of opiate analgesic: Secondary | ICD-10-CM | POA: Diagnosis not present

## 2016-03-04 DIAGNOSIS — R7309 Other abnormal glucose: Secondary | ICD-10-CM | POA: Diagnosis not present

## 2016-03-08 DIAGNOSIS — Z6839 Body mass index (BMI) 39.0-39.9, adult: Secondary | ICD-10-CM | POA: Diagnosis not present

## 2016-03-08 DIAGNOSIS — K912 Postsurgical malabsorption, not elsewhere classified: Secondary | ICD-10-CM | POA: Diagnosis not present

## 2016-03-08 DIAGNOSIS — Z23 Encounter for immunization: Secondary | ICD-10-CM | POA: Diagnosis not present

## 2016-03-08 DIAGNOSIS — Z9884 Bariatric surgery status: Secondary | ICD-10-CM | POA: Diagnosis not present

## 2016-03-09 DIAGNOSIS — Z79891 Long term (current) use of opiate analgesic: Secondary | ICD-10-CM | POA: Diagnosis not present

## 2016-03-09 DIAGNOSIS — Z7901 Long term (current) use of anticoagulants: Secondary | ICD-10-CM | POA: Diagnosis not present

## 2016-03-09 DIAGNOSIS — Z48815 Encounter for surgical aftercare following surgery on the digestive system: Secondary | ICD-10-CM | POA: Diagnosis not present

## 2016-03-09 DIAGNOSIS — I10 Essential (primary) hypertension: Secondary | ICD-10-CM | POA: Diagnosis not present

## 2016-03-09 DIAGNOSIS — Z86718 Personal history of other venous thrombosis and embolism: Secondary | ICD-10-CM | POA: Diagnosis not present

## 2016-03-09 DIAGNOSIS — Z602 Problems related to living alone: Secondary | ICD-10-CM | POA: Diagnosis not present

## 2016-03-09 DIAGNOSIS — R7309 Other abnormal glucose: Secondary | ICD-10-CM | POA: Diagnosis not present

## 2016-03-15 ENCOUNTER — Encounter: Payer: Commercial Managed Care - HMO | Attending: General Surgery

## 2016-03-15 DIAGNOSIS — R7309 Other abnormal glucose: Secondary | ICD-10-CM | POA: Diagnosis not present

## 2016-03-15 DIAGNOSIS — I10 Essential (primary) hypertension: Secondary | ICD-10-CM | POA: Insufficient documentation

## 2016-03-15 DIAGNOSIS — Z86718 Personal history of other venous thrombosis and embolism: Secondary | ICD-10-CM | POA: Diagnosis not present

## 2016-03-15 DIAGNOSIS — R7303 Prediabetes: Secondary | ICD-10-CM | POA: Insufficient documentation

## 2016-03-15 DIAGNOSIS — M1611 Unilateral primary osteoarthritis, right hip: Secondary | ICD-10-CM | POA: Insufficient documentation

## 2016-03-15 DIAGNOSIS — Z79891 Long term (current) use of opiate analgesic: Secondary | ICD-10-CM | POA: Diagnosis not present

## 2016-03-15 DIAGNOSIS — Z602 Problems related to living alone: Secondary | ICD-10-CM | POA: Diagnosis not present

## 2016-03-15 DIAGNOSIS — Z48815 Encounter for surgical aftercare following surgery on the digestive system: Secondary | ICD-10-CM | POA: Diagnosis not present

## 2016-03-15 DIAGNOSIS — Z7901 Long term (current) use of anticoagulants: Secondary | ICD-10-CM | POA: Diagnosis not present

## 2016-03-15 DIAGNOSIS — E079 Disorder of thyroid, unspecified: Secondary | ICD-10-CM | POA: Insufficient documentation

## 2016-03-15 DIAGNOSIS — Z713 Dietary counseling and surveillance: Secondary | ICD-10-CM | POA: Insufficient documentation

## 2016-03-15 NOTE — Progress Notes (Signed)
Bariatric Class:  Appt start time: 1530 end time:  1630.  2 Week Post-Operative Nutrition Class  Patient was seen on 03/15/2016 for Post[facility]ative [facility].   Surgery date: 02/29/2016 Surgery type: sleeve gastrectomy Start weight at [facility]: 215 lbs on 02/03/2016 Weight today: 198.8 lbs  Weight change: 13.4 lbs  TANITA  BODY COMP RESULTS  02/15/16 03/15/16   BMI (kg/m^2) 41.4 38.8   Fat Mass (lbs) 87.6 81.0   Fat Free Mass (lbs) 124.6 117.8   Total Body Water (lbs) 89.2 84.0   The following the learning objectives were met by the patient during this course:  Identifies Phase 3A (Soft, High Proteins) Dietary Goals and will begin from 2 weeks post-operatively to 2 months post-operatively  Identifies appropriate sources of fluids and proteins   States protein recommendations and appropriate sources post-operatively  Identifies the need for appropriate texture modifications, mastication, and bite sizes when consuming solids  Identifies appropriate multivitamin and calcium sources post-operatively  Describes the need for physical activity post-operatively and will follow MD recommendations  States when to call healthcare provider regarding medication questions or post-operative complications  Handouts given during class include:  Phase 3A: Soft, High Protein Diet Handout  Follow-Up Plan: Patient will follow-up at [facility] in 6 weeks for 2 month post-op nutrition visit for diet advancement per MD.

## 2016-03-21 DIAGNOSIS — H2701 Aphakia, right eye: Secondary | ICD-10-CM | POA: Diagnosis not present

## 2016-03-21 DIAGNOSIS — Z86718 Personal history of other venous thrombosis and embolism: Secondary | ICD-10-CM | POA: Diagnosis not present

## 2016-03-21 DIAGNOSIS — H4423 Degenerative myopia, bilateral: Secondary | ICD-10-CM | POA: Diagnosis not present

## 2016-03-21 DIAGNOSIS — Z79891 Long term (current) use of opiate analgesic: Secondary | ICD-10-CM | POA: Diagnosis not present

## 2016-03-21 DIAGNOSIS — I10 Essential (primary) hypertension: Secondary | ICD-10-CM | POA: Diagnosis not present

## 2016-03-21 DIAGNOSIS — H4031X1 Glaucoma secondary to eye trauma, right eye, mild stage: Secondary | ICD-10-CM | POA: Diagnosis not present

## 2016-03-21 DIAGNOSIS — R7309 Other abnormal glucose: Secondary | ICD-10-CM | POA: Diagnosis not present

## 2016-03-21 DIAGNOSIS — H5440 Blindness, one eye, unspecified eye: Secondary | ICD-10-CM | POA: Diagnosis not present

## 2016-03-21 DIAGNOSIS — Z602 Problems related to living alone: Secondary | ICD-10-CM | POA: Diagnosis not present

## 2016-03-21 DIAGNOSIS — Z48815 Encounter for surgical aftercare following surgery on the digestive system: Secondary | ICD-10-CM | POA: Diagnosis not present

## 2016-03-21 DIAGNOSIS — Z7901 Long term (current) use of anticoagulants: Secondary | ICD-10-CM | POA: Diagnosis not present

## 2016-03-30 DIAGNOSIS — I10 Essential (primary) hypertension: Secondary | ICD-10-CM | POA: Diagnosis not present

## 2016-03-30 DIAGNOSIS — Z9884 Bariatric surgery status: Secondary | ICD-10-CM | POA: Diagnosis not present

## 2016-03-30 DIAGNOSIS — R7303 Prediabetes: Secondary | ICD-10-CM | POA: Diagnosis not present

## 2016-03-31 ENCOUNTER — Other Ambulatory Visit: Payer: Self-pay | Admitting: General Surgery

## 2016-03-31 DIAGNOSIS — Z7901 Long term (current) use of anticoagulants: Secondary | ICD-10-CM | POA: Diagnosis not present

## 2016-03-31 DIAGNOSIS — Z602 Problems related to living alone: Secondary | ICD-10-CM | POA: Diagnosis not present

## 2016-03-31 DIAGNOSIS — Z86718 Personal history of other venous thrombosis and embolism: Secondary | ICD-10-CM | POA: Diagnosis not present

## 2016-03-31 DIAGNOSIS — Z79891 Long term (current) use of opiate analgesic: Secondary | ICD-10-CM | POA: Diagnosis not present

## 2016-03-31 DIAGNOSIS — I10 Essential (primary) hypertension: Secondary | ICD-10-CM | POA: Diagnosis not present

## 2016-03-31 DIAGNOSIS — Z48815 Encounter for surgical aftercare following surgery on the digestive system: Secondary | ICD-10-CM | POA: Diagnosis not present

## 2016-03-31 DIAGNOSIS — R7309 Other abnormal glucose: Secondary | ICD-10-CM | POA: Diagnosis not present

## 2016-04-06 DIAGNOSIS — R799 Abnormal finding of blood chemistry, unspecified: Secondary | ICD-10-CM | POA: Diagnosis not present

## 2016-04-27 ENCOUNTER — Encounter: Payer: Commercial Managed Care - HMO | Attending: General Surgery | Admitting: Dietician

## 2016-04-27 DIAGNOSIS — Z713 Dietary counseling and surveillance: Secondary | ICD-10-CM | POA: Diagnosis not present

## 2016-04-27 DIAGNOSIS — E079 Disorder of thyroid, unspecified: Secondary | ICD-10-CM | POA: Diagnosis not present

## 2016-04-27 DIAGNOSIS — M1611 Unilateral primary osteoarthritis, right hip: Secondary | ICD-10-CM | POA: Diagnosis not present

## 2016-04-27 DIAGNOSIS — R7303 Prediabetes: Secondary | ICD-10-CM | POA: Diagnosis not present

## 2016-04-27 DIAGNOSIS — I10 Essential (primary) hypertension: Secondary | ICD-10-CM | POA: Diagnosis not present

## 2016-04-27 NOTE — Patient Instructions (Addendum)
Goals:  Follow Phase 3B: High Protein + Non-Starchy Vegetables  Eat 3-6 small meals/snacks, every 3-5 hrs  Increase lean protein foods to meet 60g goal  Increase fluid intake to 64oz +  Avoid drinking 15 minutes before, during and 30 minutes after eating  Aim for >30 min of physical activity daily  Eating mostly protein and vegetables (limit crackers and cream of wheat)  Try chair exercises   Surgery date: 02/29/2016 Surgery type: sleeve gastrectomy [facility] lbs on 02/03/2016 Weight today: 194.6  lbs  Weight change: 4.2 lbs Total weight loss: 20.4 lbs  TANITA  BODY COMP RESULTS  02/15/16 03/15/16 04/27/16   BMI (kg/m^2) 41.4 38.8 38.0   Fat Mass (lbs) 87.6 81.0 75.2   Fat Free Mass (lbs) 124.6 117.8 119.4   Total Body Water (lbs) 89.2 84.0 85.0

## 2016-04-27 NOTE — Progress Notes (Signed)
  Follow-up visit:  8 Weeks Post-Operative Sleeve Gastrectomy Surgery  Medical Nutrition Therapy:  Appt start time: 1015 end time:  1040.  Primary concerns today: Post-operative Bariatric Surgery Nutrition Management. Returns with a 4.2 lbs weight loss since post op class. Added vegetables a month ago per Dr. Andrey CampanileWilson. Tolerating everything ok. Lost 50 lbs before surgery.   Will be getting hip replace in New Year. Now using a walker and hasn't been exercising.  Not feeling hungry throughout the day.  Surgery date: 02/29/2016 Surgery type: sleeve gastrectomy [facility] lbs on 02/03/2016 Weight today: 194.6  lbs  Weight change: 4.2 lbs Total weight loss: 20.4 lbs  Weight loss goal: 150 lbs   TANITA  BODY COMP RESULTS  02/15/16 03/15/16 04/27/16   BMI (kg/m^2) 41.4 38.8 38.0   Fat Mass (lbs) 87.6 81.0 75.2   Fat Free Mass (lbs) 124.6 117.8 119.4   Total Body Water (lbs) 89.2 84.0 85.0    Preferred Learning Style:   No preference indicated   Learning Readiness:   Ready  24-hr recall: B (AM): 2-3 x week Premier Protein or 1 x week cream of wheat or 2 eggs (0-30 g) Snk (AM): cheese (7g) L (PM): chicken vegetable soup (2 breast per pot/unsure how much she gets in) Snk (PM): saltine cracker D (PM): 3 oz fish and vegetable (21 g) Snk (PM): crackers  Has one protein shake per day and one protein water (15 g)  Fluid intake: 11 oz protein shake, 17 oz protein water, 8 oz coffee with half and half, 8 oz skim milk, 34 oz water (around 78 g)  Estimated total protein intake: over 60 grams per day  Medications: see list  Supplementation: taking  Using straws: No Drinking while eating: tries not to  Hair loss: No Carbonated beverages: No N/V/D/C: No Dumping syndrome: No  Recent physical activity:  None until she gets her hip done  Progress Towards Goal(s):  In progress.  Handouts given during visit include:  Phase 3B High Protein + Non Starchy  Vegetables   Nutritional Diagnosis:  -3.3 Overweight/obesity related to past poor dietary habits and physical inactivity as evidenced by patient w/ recent sleeve gastrectomy surgery following dietary guidelines for continued weight loss.    Intervention:  Nutrition counseling provided. Goals:  Follow Phase 3B: High Protein + Non-Starchy Vegetables  Eat 3-6 small meals/snacks, every 3-5 hrs  Increase lean protein foods to meet 60g goal  Increase fluid intake to 64oz +  Avoid drinking 15 minutes before, during and 30 minutes after eating  Aim for >30 min of physical activity daily  Eating mostly protein and vegetables (limit crackers and cream of wheat)  Try chair exercises  Teaching Method Utilized:  Visual Auditory Hands on  Barriers to learning/adherence to lifestyle change: none  Demonstrated degree of understanding via:  Teach Back   Monitoring/Evaluation:  Dietary intake, exercise, and body weight. Follow up in 2 months for 4 month post-op visit.

## 2016-05-04 DIAGNOSIS — M25551 Pain in right hip: Secondary | ICD-10-CM | POA: Diagnosis not present

## 2016-05-04 DIAGNOSIS — M1611 Unilateral primary osteoarthritis, right hip: Secondary | ICD-10-CM | POA: Diagnosis not present

## 2016-05-10 NOTE — Progress Notes (Signed)
Pt is being scheduled for preop appt; please place surgical orders in epic. Thanks.  

## 2016-05-17 DIAGNOSIS — N3001 Acute cystitis with hematuria: Secondary | ICD-10-CM | POA: Diagnosis not present

## 2016-05-17 DIAGNOSIS — M1611 Unilateral primary osteoarthritis, right hip: Secondary | ICD-10-CM | POA: Diagnosis not present

## 2016-05-17 DIAGNOSIS — R8299 Other abnormal findings in urine: Secondary | ICD-10-CM | POA: Diagnosis not present

## 2016-05-17 DIAGNOSIS — Z01810 Encounter for preprocedural cardiovascular examination: Secondary | ICD-10-CM | POA: Diagnosis not present

## 2016-05-19 DIAGNOSIS — Z01810 Encounter for preprocedural cardiovascular examination: Secondary | ICD-10-CM | POA: Diagnosis not present

## 2016-05-19 DIAGNOSIS — R918 Other nonspecific abnormal finding of lung field: Secondary | ICD-10-CM | POA: Diagnosis not present

## 2016-05-30 DIAGNOSIS — R8299 Other abnormal findings in urine: Secondary | ICD-10-CM | POA: Diagnosis not present

## 2016-06-04 NOTE — H&P (Signed)
TOTAL HIP ADMISSION H&P  Patient is admitted for right total hip arthroplasty, anterior approach.  Subjective:  Chief Complaint:     Right hip primary OA / pain  HPI: Lauren Clark, 60 y.o. female, has a history of pain and functional disability in the right hip(s) due to arthritis and patient has failed non-surgical conservative treatments for greater than 12 weeks to include NSAID's and/or analgesics, use of assistive devices and activity modification.  Onset of symptoms was gradual starting 2+ years ago with gradually worsening course since that time.The patient noted no past surgery on the right hip(s).  Patient currently rates pain in the right hip at 7 out of 10 with activity. Patient has night pain, worsening of pain with activity and weight bearing, trendelenberg gait, pain that interfers with activities of daily living and pain with passive range of motion. Patient has evidence of periarticular osteophytes and joint space narrowing by imaging studies. This condition presents safety issues increasing the risk of falls.  There is no current active infection.   Risks, benefits and expectations were discussed with the patient.  Risks including but not limited to the risk of anesthesia, blood clots, nerve damage, blood vessel damage, failure of the prosthesis, infection and up to and including death.  Patient understand the risks, benefits and expectations and wishes to proceed with surgery.   PCP: Blane OharaOX,KIRSTEN, MD  D/C Plans:      Home  Post-op Meds:       No Rx given  Tranexamic Acid:      To be given - topically (previous PE & DVT)  Decadron:      Is to be given  FYI:     Xarelto then ASA (previous clots)  Norco  ? - abx  (previous knee infection)     Patient Active Problem List   Diagnosis Date Noted  . Prediabetes 02/29/2016  . Dyslipidemia 02/29/2016  . Degenerative joint disease of right hip 02/29/2016  . S/P laparoscopic sleeve gastrectomy 02/29/2016  . Preoperative  cardiovascular examination 02/19/2016  . Hyperlipidemia 02/19/2016  . Morbid obesity due to excess calories (HCC) 02/19/2016  . Essential hypertension 02/19/2016   Past Medical History:  Diagnosis Date  . Anxiety   . Arthritis   . Depression   . History of blood transfusion   . Hyperlipidemia   . Hypertension   . Morbid obesity (HCC)   . Peripheral vascular disease (HCC)    dvt right leg    Past Surgical History:  Procedure Laterality Date  . ABDOMINAL HYSTERECTOMY  1981   with bilateral BSO  . EYE SURGERY     bil cataraction with IOL as a child, cataract surgery late 1990- right  . JOINT REPLACEMENT Right 2016   knee  . KNEE SURGERY     i&d KNEE POST REPLACEMENT  . LAPAROSCOPIC GASTRIC SLEEVE RESECTION N/A 02/29/2016   Procedure: LAPAROSCOPIC GASTRIC SLEEVE RESECTION, UPPER ENDO;  Surgeon: Gaynelle AduEric [name], MD;  Location: [facility];  Service: General;  Laterality: N/A;    No prescriptions prior to admission.   Allergies  Allergen Reactions  . Crestor [Rosuvastatin Calcium] Other (See Comments)    Muscle aches    . Lipitor [Atorvastatin] Other (See Comments)    Muscle aches   . Hctz [Hydrochlorothiazide]     Pt doesn't remember    Social History  Substance Use Topics  . Smoking status: Never Smoker  . Smokeless tobacco: Never Used  . Alcohol use No    Family  History  Problem Relation Age of Onset  . Depression Mother   . Hypertension Father      Review of Systems  Constitutional: Negative.   HENT: Negative.   Eyes: Negative.   Respiratory: Negative.   Cardiovascular: Negative.   Gastrointestinal: Negative.   Genitourinary: Negative.   Musculoskeletal: Positive for joint pain.  Skin: Negative.   Neurological: Negative.   Endo/Heme/Allergies: Negative.   Psychiatric/Behavioral: Positive for depression. The patient is nervous/anxious.     Objective:  Physical Exam  Constitutional: She is oriented to person, place, and time. She appears well-developed.   HENT:  Head: Normocephalic.  Eyes: Pupils are equal, round, and reactive to light.  Neck: Neck supple. No JVD present. No tracheal deviation present. No thyromegaly present.  Cardiovascular: Normal rate, regular rhythm, normal heart sounds and intact distal pulses.   Respiratory: Effort normal and breath sounds normal. No stridor. No respiratory distress. She has no wheezes.  GI: Soft. There is no tenderness. There is no guarding.  Musculoskeletal:       Right hip: She exhibits decreased range of motion, decreased strength, tenderness and bony tenderness. She exhibits no swelling, no deformity and no laceration.  Lymphadenopathy:    She has no cervical adenopathy.  Neurological: She is alert and oriented to person, place, and time.  Skin: Skin is warm and dry.  Psychiatric: She has a normal mood and affect.      Labs:  Estimated body mass index is 38.01 kg/m as calculated from the following:   Height as of 04/27/16: 5' (1.524 m).   Weight as of 04/27/16: 88.3 kg (194 lb 9.6 oz).   Imaging Review Plain radiographs demonstrate severe degenerative joint disease of the right hip(s). The bone quality appears to be good for age and reported activity level.  Assessment/Plan:  End stage arthritis, right hip(s)  The patient history, physical examination, clinical judgement of the provider and imaging studies are consistent with end stage degenerative joint disease of the right hip(s) and total hip arthroplasty is deemed medically necessary. The treatment options including medical management, injection therapy, arthroscopy and arthroplasty were discussed at length. The risks and benefits of total hip arthroplasty were presented and reviewed. The risks due to aseptic loosening, infection, stiffness, dislocation/subluxation,  thromboembolic complications and other imponderables were discussed.  The patient acknowledged the explanation, agreed to proceed with the plan and consent was signed.  Patient is being admitted for inpatient treatment for surgery, pain control, PT, OT, prophylactic antibiotics, VTE prophylaxis, progressive ambulation and ADL's and discharge planning.The patient is planning to be discharged home.      Anastasio AuerbachMatthew S. Kable Haywood   PA-C  06/04/2016, 9:19 PM

## 2016-06-08 ENCOUNTER — Encounter (HOSPITAL_COMMUNITY): Payer: Self-pay

## 2016-06-08 ENCOUNTER — Encounter (HOSPITAL_COMMUNITY)
Admission: RE | Admit: 2016-06-08 | Discharge: 2016-06-08 | Disposition: A | Discharge status: Home / Self Care | Payer: Commercial Managed Care - HMO | Source: Ambulatory Visit | Attending: Orthopedic Surgery | Admitting: Orthopedic Surgery

## 2016-06-08 DIAGNOSIS — Z01812 Encounter for preprocedural laboratory examination: Secondary | ICD-10-CM | POA: Insufficient documentation

## 2016-06-08 HISTORY — DX: Gastro-esophageal reflux disease without esophagitis: K21.9

## 2016-06-08 HISTORY — DX: Unqualified visual loss, both eyes: H54.3

## 2016-06-08 HISTORY — DX: Unspecified glaucoma: H40.9

## 2016-06-08 HISTORY — DX: Prediabetes: R73.03

## 2016-06-08 LAB — BASIC METABOLIC PANEL
ANION GAP: 9 (ref 5–15)
BUN: 42 mg/dL — ABNORMAL HIGH (ref 6–20)
CALCIUM: 9.7 mg/dL (ref 8.9–10.3)
CO2: 27 mmol/L (ref 22–32)
CREATININE: 1.29 mg/dL — AB (ref 0.44–1.00)
Chloride: 99 mmol/L — ABNORMAL LOW (ref 101–111)
GFR, EST AFRICAN AMERICAN: 51 mL/min — AB (ref >60)
GFR, EST NON AFRICAN AMERICAN: 44 mL/min — AB (ref >60)
Glucose, Bld: 104 mg/dL — ABNORMAL HIGH (ref 65–99)
Potassium: 4.9 mmol/L (ref 3.5–5.1)
SODIUM: 135 mmol/L (ref 135–145)

## 2016-06-08 LAB — CBC
HCT: 42.9 % (ref 36.0–46.0)
HEMOGLOBIN: 14.2 g/dL (ref 12.0–15.0)
MCH: 30.4 pg (ref 26.0–34.0)
MCHC: 33.1 g/dL (ref 30.0–36.0)
MCV: 91.9 fL (ref 78.0–100.0)
PLATELETS: 292 10*3/uL (ref 150–400)
RBC: 4.67 MIL/uL (ref 3.87–5.11)
RDW: 13.2 % (ref 11.5–15.5)
WBC: 14.4 10*3/uL — AB (ref 4.0–10.5)

## 2016-06-08 LAB — ABO/RH: ABO/RH(D): A POS

## 2016-06-08 LAB — SURGICAL PCR SCREEN
MRSA, PCR: NEGATIVE
Staphylococcus aureus: POSITIVE — AB

## 2016-06-08 NOTE — Progress Notes (Signed)
06-08-16 1630 Positive Staph aureus -will use Mupirocin as directed. Rx called [facility]. Note to Dr. Nilsa Nuttinglin's office [phone].

## 2016-06-08 NOTE — Progress Notes (Signed)
06-08-16 1430 Note labs viewable in Epic. -note WBC, Bun.

## 2016-06-08 NOTE — Patient Instructions (Signed)
Lauren Clark  06/08/2016   Your procedure is scheduled on: 06-14-16  Report to [facility] Main  Entrance take [facility]  elevators to 3rd floor to  Short Stay Center at   1245 AM.  Call this number if you have problems the morning of surgery [phone]   Remember: ONLY 1 PERSON MAY GO WITH YOU TO SHORT STAY TO GET  READY MORNING OF YOUR SURGERY.  Do not eat food or drink liquids :After Midnight. Exception May have Clear Liquids 12 midnight to 0900 AM, then nothing.   CLEAR LIQUID DIET   Foods Allowed                                                                     Foods Excluded  Coffee and tea, regular and decaf                             liquids that you cannot  Plain Jell-O in any flavor                                             see through such as: Fruit ices (not with fruit pulp)                                     milk, soups, orange juice  Iced Popsicles                                    All solid food                                  Cranberry, grape and apple juices Sports drinks like Gatorade Lightly seasoned clear broth or consume(fat free) Sugar, honey syrup   _____________________________________________________________________       Take these medicines the morning of surgery with A SIP OF WATER: Cartia. Levothyroxine. Fluoxetine. Pantoprazole. Eye drops-usual. Pain med -if need. DO NOT TAKE ANY DIABETIC MEDICATIONS DAY OF YOUR SURGERY                               You may not have any metal on your body including hair pins and              piercings  Do not wear jewelry, make-up, lotions, powders or perfumes, deodorant             Do not wear nail polish.  Do not shave  48 hours prior to surgery.              Men may shave face and neck.   Do not bring valuables to the hospital. [facility] IS NOT  RESPONSIBLE   FOR VALUABLES.  Contacts, dentures or bridgework may not be worn into surgery.  Leave suitcase in the car.  After surgery it may be brought to your room.     Patients discharged the day of surgery will not be allowed to drive home.  Name and phone number of your driver: Vernon PreySandy Bickle-sister in law [phone] cell  Special Instructions: N/A              Please read over the following fact sheets you were given: _____________________________________________________________________             [facility] - Preparing for Surgery Before surgery, you can play an important role.  Because skin is not sterile, your skin needs to be as free of germs as possible.  You can reduce the number of germs on your skin by washing with CHG (chlorahexidine gluconate) soap before surgery.  CHG is an antiseptic cleaner which kills germs and bonds with the skin to continue killing germs even after washing. Please DO NOT use if you have an allergy to CHG or antibacterial soaps.  If your skin becomes reddened/irritated stop using the CHG and inform your nurse when you arrive at Short Stay. Do not shave (including legs and underarms) for at least 48 hours prior to the first CHG shower.  You may shave your face/neck. Please follow these instructions carefully:  1.  Shower with CHG Soap the night before surgery and the  morning of Surgery.  2.  If you choose to wash your hair, wash your hair first as usual with your  normal  shampoo.  3.  After you shampoo, rinse your hair and body thoroughly to remove the  shampoo.                           4.  Use CHG as you would any other liquid soap.  You can apply chg directly  to the skin and wash                       Gently with a scrungie or clean washcloth.  5.  Apply the CHG Soap to your body ONLY FROM THE NECK DOWN.   Do not use on face/ open                           Wound or open sores. Avoid contact with eyes, ears mouth and genitals (private parts).                       Wash face,  Genitals (private parts) with your normal soap.             6.  Wash thoroughly, paying special  attention to the area where your surgery  will be performed.  7.  Thoroughly rinse your body with warm water from the neck down.  8.  DO NOT shower/wash with your normal soap after using and rinsing off  the CHG Soap.                9.  Pat yourself dry with a clean towel.            10.  Wear clean pajamas.            11.  Place clean sheets on your bed the night of your first shower and do not  sleep  with pets. Day of Surgery : Do not apply any lotions/deodorants the morning of surgery.  Please wear clean clothes to the hospital/surgery center.  FAILURE TO FOLLOW THESE INSTRUCTIONS MAY RESULT IN THE CANCELLATION OF YOUR SURGERY PATIENT SIGNATURE_________________________________  NURSE SIGNATURE__________________________________  ________________________________________________________________________   Lauren Clark  An incentive spirometer is a tool that can help keep your lungs clear and active. This tool measures how well you are filling your lungs with each breath. Taking long deep breaths may help reverse or decrease the chance of developing breathing (pulmonary) problems (especially infection) following:  A long period of time when you are unable to move or be active. BEFORE THE PROCEDURE   If the spirometer includes an indicator to show your best effort, your nurse or respiratory therapist will set it to a desired goal.  If possible, sit up straight or lean slightly forward. Try not to slouch.  Hold the incentive spirometer in an upright position. INSTRUCTIONS FOR USE  1. Sit on the edge of your bed if possible, or sit up as far as you can in bed or on a chair. 2. Hold the incentive spirometer in an upright position. 3. Breathe out normally. 4. Place the mouthpiece in your mouth and seal your lips tightly around it. 5. Breathe in slowly and as deeply as possible, raising the piston or the ball toward the top of the column. 6. Hold your breath for 3-5 seconds or for  as long as possible. Allow the piston or ball to fall to the bottom of the column. 7. Remove the mouthpiece from your mouth and breathe out normally. 8. Rest for a few seconds and repeat Steps 1 through 7 at least 10 times every 1-2 hours when you are awake. Take your time and take a few normal breaths between deep breaths. 9. The spirometer may include an indicator to show your best effort. Use the indicator as a goal to work toward during each repetition. 10. After each set of 10 deep breaths, practice coughing to be sure your lungs are clear. If you have an incision (the cut made at the time of surgery), support your incision when coughing by placing a pillow or rolled up towels firmly against it. Once you are able to get out of bed, walk around indoors and cough well. You may stop using the incentive spirometer when instructed by your caregiver.  RISKS AND COMPLICATIONS  Take your time so you do not get dizzy or light-headed.  If you are in pain, you may need to take or ask for pain medication before doing incentive spirometry. It is harder to take a deep breath if you are having pain. AFTER USE  Rest and breathe slowly and easily.  It can be helpful to keep track of a log of your progress. Your caregiver can provide you with a simple table to help with this. If you are using the spirometer at home, follow these instructions: SEEK MEDICAL CARE IF:   You are having difficultly using the spirometer.  You have trouble using the spirometer as often as instructed.  Your pain medication is not giving enough relief while using the spirometer.  You develop fever of 100.5 F (38.1 C) or higher. SEEK IMMEDIATE MEDICAL CARE IF:   You cough up bloody sputum that had not been present before.  You develop fever of 102 F (38.9 C) or greater.  You develop worsening pain at or near the incision site. MAKE SURE YOU:   Understand these  instructions.  Will watch your condition.  Will get  help right away if you are not doing well or get worse. Document Released: 10/31/2006 Document Revised: 09/12/2011 Document Reviewed: 01/01/2007 ExitCare Patient Information 2014 ExitCare, [place].   ________________________________________________________________________  WHAT IS A BLOOD TRANSFUSION? Blood Transfusion Information  A transfusion is the replacement of blood or some of its parts. Blood is made up of multiple cells which provide different functions.  Red blood cells carry oxygen and are used for blood loss replacement.  White blood cells fight against infection.  Platelets control bleeding.  Plasma helps clot blood.  Other blood products are available for specialized needs, such as hemophilia or other clotting disorders. BEFORE THE TRANSFUSION  Who gives blood for transfusions?   Healthy volunteers who are fully evaluated to make sure their blood is safe. This is blood bank blood. Transfusion therapy is the safest it has ever been in the practice of medicine. Before blood is taken from a donor, a complete history is taken to make sure that person has no history of diseases nor engages in risky social behavior (examples are intravenous drug use or sexual activity with multiple partners). The donor's travel history is screened to minimize risk of transmitting infections, such as malaria. The donated blood is tested for signs of infectious diseases, such as HIV and hepatitis. The blood is then tested to be sure it is compatible with you in order to minimize the chance of a transfusion reaction. If you or a relative donates blood, this is often done in anticipation of surgery and is not appropriate for emergency situations. It takes many days to process the donated blood. RISKS AND COMPLICATIONS Although transfusion therapy is very safe and saves many lives, the main dangers of transfusion include:   Getting an infectious disease.  Developing a transfusion reaction. This is an  allergic reaction to something in the blood you were given. Every precaution is taken to prevent this. The decision to have a blood transfusion has been considered carefully by your caregiver before blood is given. Blood is not given unless the benefits outweigh the risks. AFTER THE TRANSFUSION  Right after receiving a blood transfusion, you will usually feel much better and more energetic. This is especially true if your red blood cells have gotten low (anemic). The transfusion raises the level of the red blood cells which carry oxygen, and this usually causes an energy increase.  The nurse administering the transfusion will monitor you carefully for complications. HOME CARE INSTRUCTIONS  No special instructions are needed after a transfusion. You may find your energy is better. Speak with your caregiver about any limitations on activity for underlying diseases you may have. SEEK MEDICAL CARE IF:   Your condition is not improving after your transfusion.  You develop redness or irritation at the intravenous (IV) site. SEEK IMMEDIATE MEDICAL CARE IF:  Any of the following symptoms occur over the next 12 hours:  Shaking chills.  You have a temperature by mouth above 102 F (38.9 C), not controlled by medicine.  Chest, back, or muscle pain.  People around you feel you are not acting correctly or are confused.  Shortness of breath or difficulty breathing.  Dizziness and fainting.  You get a rash or develop hives.  You have a decrease in urine output.  Your urine turns a dark color or changes to pink, red, or brown. Any of the following symptoms occur over the next 10 days:  You have a temperature by mouth  above 102 F (38.9 C), not controlled by medicine.  Shortness of breath.  Weakness after normal activity.  The white part of the eye turns yellow (jaundice).  You have a decrease in the amount of urine or are urinating less often.  Your urine turns a dark color or changes  to pink, red, or brown. Document Released: 06/17/2000 Document Revised: 09/12/2011 Document Reviewed: 02/04/2008 [facility] Patient Information 2014 [place].  _______________________________________________________________________

## 2016-06-08 NOTE — Pre-Procedure Instructions (Signed)
EKG 8'17, CXR 7'17 Epic. Clearance note Dr. Sedalia Mutaox with chert.

## 2016-06-09 LAB — HEMOGLOBIN A1C
Hgb A1c MFr Bld: 5.4 % (ref 4.8–5.6)
MEAN PLASMA GLUCOSE: 108 mg/dL

## 2016-06-14 ENCOUNTER — Inpatient Hospital Stay (HOSPITAL_COMMUNITY): Payer: Commercial Managed Care - HMO | Admitting: Registered Nurse

## 2016-06-14 ENCOUNTER — Encounter (HOSPITAL_COMMUNITY): Payer: Self-pay | Admitting: *Deleted

## 2016-06-14 ENCOUNTER — Inpatient Hospital Stay (HOSPITAL_COMMUNITY)
Admission: RE | Admit: 2016-06-14 | Discharge: 2016-06-16 | DRG: 470 | Disposition: A | Discharge status: Home Health | Payer: Commercial Managed Care - HMO | Source: Ambulatory Visit | Attending: Orthopedic Surgery | Admitting: Orthopedic Surgery

## 2016-06-14 ENCOUNTER — Inpatient Hospital Stay (HOSPITAL_COMMUNITY): Payer: Commercial Managed Care - HMO

## 2016-06-14 ENCOUNTER — Encounter (HOSPITAL_COMMUNITY)
Admission: RE | Disposition: A | Discharge status: Home Health | Payer: Self-pay | Source: Ambulatory Visit | Attending: Orthopedic Surgery

## 2016-06-14 DIAGNOSIS — M1611 Unilateral primary osteoarthritis, right hip: Secondary | ICD-10-CM | POA: Diagnosis not present

## 2016-06-14 DIAGNOSIS — F419 Anxiety disorder, unspecified: Secondary | ICD-10-CM | POA: Diagnosis present

## 2016-06-14 DIAGNOSIS — M25551 Pain in right hip: Secondary | ICD-10-CM | POA: Diagnosis present

## 2016-06-14 DIAGNOSIS — E669 Obesity, unspecified: Secondary | ICD-10-CM | POA: Diagnosis present

## 2016-06-14 DIAGNOSIS — Z96649 Presence of unspecified artificial hip joint: Secondary | ICD-10-CM

## 2016-06-14 DIAGNOSIS — Z8249 Family history of ischemic heart disease and other diseases of the circulatory system: Secondary | ICD-10-CM | POA: Diagnosis not present

## 2016-06-14 DIAGNOSIS — Z6836 Body mass index (BMI) 36.0-36.9, adult: Secondary | ICD-10-CM

## 2016-06-14 DIAGNOSIS — E785 Hyperlipidemia, unspecified: Secondary | ICD-10-CM | POA: Diagnosis present

## 2016-06-14 DIAGNOSIS — I10 Essential (primary) hypertension: Secondary | ICD-10-CM | POA: Diagnosis present

## 2016-06-14 DIAGNOSIS — Z9884 Bariatric surgery status: Secondary | ICD-10-CM | POA: Diagnosis not present

## 2016-06-14 DIAGNOSIS — F329 Major depressive disorder, single episode, unspecified: Secondary | ICD-10-CM | POA: Diagnosis present

## 2016-06-14 DIAGNOSIS — Z888 Allergy status to other drugs, medicaments and biological substances status: Secondary | ICD-10-CM

## 2016-06-14 DIAGNOSIS — R7309 Other abnormal glucose: Secondary | ICD-10-CM | POA: Diagnosis not present

## 2016-06-14 DIAGNOSIS — Z96641 Presence of right artificial hip joint: Secondary | ICD-10-CM | POA: Diagnosis not present

## 2016-06-14 DIAGNOSIS — Z818 Family history of other mental and behavioral disorders: Secondary | ICD-10-CM | POA: Diagnosis not present

## 2016-06-14 DIAGNOSIS — Z86711 Personal history of pulmonary embolism: Secondary | ICD-10-CM | POA: Diagnosis not present

## 2016-06-14 DIAGNOSIS — Z90710 Acquired absence of both cervix and uterus: Secondary | ICD-10-CM

## 2016-06-14 DIAGNOSIS — I739 Peripheral vascular disease, unspecified: Secondary | ICD-10-CM | POA: Diagnosis not present

## 2016-06-14 DIAGNOSIS — Z471 Aftercare following joint replacement surgery: Secondary | ICD-10-CM | POA: Diagnosis not present

## 2016-06-14 DIAGNOSIS — Z96651 Presence of right artificial knee joint: Secondary | ICD-10-CM | POA: Diagnosis present

## 2016-06-14 HISTORY — PX: TOTAL HIP ARTHROPLASTY: SHX124

## 2016-06-14 LAB — TYPE AND SCREEN
ABO/RH(D): A POS
ANTIBODY SCREEN: NEGATIVE

## 2016-06-14 SURGERY — ARTHROPLASTY, HIP, TOTAL, ANTERIOR APPROACH
Anesthesia: Spinal | Site: Hip | Laterality: Right

## 2016-06-14 MED ORDER — PROPOFOL 10 MG/ML IV BOLUS
INTRAVENOUS | Status: DC | PRN
  Administered 2016-06-14 (×2): 20 mg via INTRAVENOUS

## 2016-06-14 MED ORDER — CEFAZOLIN SODIUM-DEXTROSE 2-4 GM/100ML-% IV SOLN
2.0000 g | Freq: Four times a day (QID) | INTRAVENOUS | Status: AC
  Administered 2016-06-14 – 2016-06-15 (×2): 2 g via INTRAVENOUS
  Filled 2016-06-14 (×2): qty 100

## 2016-06-14 MED ORDER — GLYCOPYRROLATE 0.2 MG/ML IV SOSY
PREFILLED_SYRINGE | INTRAVENOUS | Status: AC
  Filled 2016-06-14: qty 3

## 2016-06-14 MED ORDER — PHENYLEPHRINE 40 MCG/ML (10ML) SYRINGE FOR IV PUSH (FOR BLOOD PRESSURE SUPPORT)
PREFILLED_SYRINGE | INTRAVENOUS | Status: DC | PRN
  Administered 2016-06-14 (×2): 80 ug via INTRAVENOUS

## 2016-06-14 MED ORDER — TOPICALS DOCUMENTATION OPTIME
TOPICAL | Status: DC | PRN
  Administered 2016-06-14: 2000 via TOPICAL

## 2016-06-14 MED ORDER — SODIUM CHLORIDE 0.9 % IV SOLN
INTRAVENOUS | Status: DC
  Administered 2016-06-14 – 2016-06-15 (×2): via INTRAVENOUS

## 2016-06-14 MED ORDER — POLYETHYLENE GLYCOL 3350 17 G PO PACK
17.0000 g | PACK | Freq: Two times a day (BID) | ORAL | Status: DC
  Administered 2016-06-14 – 2016-06-15 (×3): 17 g via ORAL
  Filled 2016-06-14 (×4): qty 1

## 2016-06-14 MED ORDER — ONDANSETRON HCL 4 MG PO TABS
4.0000 mg | ORAL_TABLET | Freq: Four times a day (QID) | ORAL | Status: DC | PRN
Start: 2016-06-14 — End: 2016-06-16

## 2016-06-14 MED ORDER — HYDROMORPHONE HCL 1 MG/ML IJ SOLN: 0.5000 mg | INTRAMUSCULAR | Status: DC | PRN

## 2016-06-14 MED ORDER — ALUM & MAG HYDROXIDE-SIMETH 200-200-20 MG/5ML PO SUSP: 30.0000 mL | ORAL | Status: DC | PRN

## 2016-06-14 MED ORDER — LACTATED RINGERS IV SOLN: INTRAVENOUS | Status: DC

## 2016-06-14 MED ORDER — DILTIAZEM HCL ER COATED BEADS 180 MG PO CP24
180.0000 mg | ORAL_CAPSULE | Freq: Every day | ORAL | Status: DC
  Administered 2016-06-14 – 2016-06-16 (×3): 180 mg via ORAL
  Filled 2016-06-14 (×4): qty 1

## 2016-06-14 MED ORDER — FLUOXETINE HCL 20 MG PO CAPS
20.0000 mg | ORAL_CAPSULE | Freq: Two times a day (BID) | ORAL | Status: DC
  Administered 2016-06-14 – 2016-06-16 (×4): 20 mg via ORAL
  Filled 2016-06-14 (×4): qty 1

## 2016-06-14 MED ORDER — GLYCOPYRROLATE 0.2 MG/ML IV SOSY
PREFILLED_SYRINGE | INTRAVENOUS | Status: DC | PRN
  Administered 2016-06-14: .2 mg via INTRAVENOUS

## 2016-06-14 MED ORDER — ONDANSETRON HCL 4 MG/2ML IJ SOLN
INTRAMUSCULAR | Status: AC
Start: 2016-06-14 — End: 2016-06-14
  Filled 2016-06-14: qty 2

## 2016-06-14 MED ORDER — SODIUM CHLORIDE 0.9 % IR SOLN
Status: DC | PRN
  Administered 2016-06-14: 1000 mL

## 2016-06-14 MED ORDER — DEXAMETHASONE SODIUM PHOSPHATE 10 MG/ML IJ SOLN
10.0000 mg | Freq: Once | INTRAMUSCULAR | Status: AC
  Administered 2016-06-14: 10 mg via INTRAVENOUS

## 2016-06-14 MED ORDER — ONDANSETRON HCL 4 MG/2ML IJ SOLN
INTRAMUSCULAR | Status: DC | PRN
  Administered 2016-06-14: 4 mg via INTRAVENOUS

## 2016-06-14 MED ORDER — HYDROCODONE-ACETAMINOPHEN 7.5-325 MG PO TABS
ORAL_TABLET | ORAL | Status: AC
  Administered 2016-06-14: 20:00:00 1
  Filled 2016-06-14: qty 1

## 2016-06-14 MED ORDER — DEXAMETHASONE SODIUM PHOSPHATE 10 MG/ML IJ SOLN
INTRAMUSCULAR | Status: AC
  Filled 2016-06-14: qty 1

## 2016-06-14 MED ORDER — PROPOFOL 10 MG/ML IV BOLUS
INTRAVENOUS | Status: AC
  Filled 2016-06-14: qty 80

## 2016-06-14 MED ORDER — ROSUVASTATIN CALCIUM 5 MG PO TABS
5.0000 mg | ORAL_TABLET | ORAL | Status: DC
  Administered 2016-06-15: 5 mg via ORAL
  Filled 2016-06-14: qty 1

## 2016-06-14 MED ORDER — LACTATED RINGERS IV SOLN
INTRAVENOUS | Status: DC | PRN
  Administered 2016-06-14 (×2): via INTRAVENOUS

## 2016-06-14 MED ORDER — HYDROCODONE-ACETAMINOPHEN 7.5-325 MG PO TABS
1.0000 | ORAL_TABLET | ORAL | Status: DC
  Administered 2016-06-14: 1 via ORAL
  Administered 2016-06-15 – 2016-06-16 (×8): 2 via ORAL
  Filled 2016-06-14: qty 2
  Filled 2016-06-14: qty 1
  Filled 2016-06-14 (×7): qty 2

## 2016-06-14 MED ORDER — PHENYLEPHRINE 40 MCG/ML (10ML) SYRINGE FOR IV PUSH (FOR BLOOD PRESSURE SUPPORT)
PREFILLED_SYRINGE | INTRAVENOUS | Status: AC
  Filled 2016-06-14: qty 10

## 2016-06-14 MED ORDER — METHOCARBAMOL 1000 MG/10ML IJ SOLN
500.0000 mg | Freq: Four times a day (QID) | INTRAVENOUS | Status: DC | PRN
  Administered 2016-06-14: 500 mg via INTRAVENOUS
  Filled 2016-06-14: qty 550
  Filled 2016-06-14: qty 5

## 2016-06-14 MED ORDER — MAGNESIUM CITRATE PO SOLN: 1.0000 | Freq: Once | ORAL | Status: DC | PRN

## 2016-06-14 MED ORDER — DEXAMETHASONE SODIUM PHOSPHATE 10 MG/ML IJ SOLN
10.0000 mg | Freq: Once | INTRAMUSCULAR | Status: AC
  Administered 2016-06-15: 10 mg via INTRAVENOUS
  Filled 2016-06-14: qty 1

## 2016-06-14 MED ORDER — ASPIRIN 81 MG PO CHEW
81.0000 mg | CHEWABLE_TABLET | Freq: Two times a day (BID) | ORAL | Status: DC
  Administered 2016-06-14 – 2016-06-16 (×4): 81 mg via ORAL
  Filled 2016-06-14 (×4): qty 1

## 2016-06-14 MED ORDER — FENTANYL CITRATE (PF) 100 MCG/2ML IJ SOLN: 25.0000 ug | INTRAMUSCULAR | Status: DC | PRN

## 2016-06-14 MED ORDER — ONDANSETRON HCL 4 MG/2ML IJ SOLN: 4.0000 mg | Freq: Four times a day (QID) | INTRAMUSCULAR | Status: DC | PRN

## 2016-06-14 MED ORDER — PHENOL 1.4 % MT LIQD
1.0000 | OROMUCOSAL | Status: DC | PRN
  Filled 2016-06-14: qty 177

## 2016-06-14 MED ORDER — DOCUSATE SODIUM 100 MG PO CAPS
100.0000 mg | ORAL_CAPSULE | Freq: Two times a day (BID) | ORAL | Status: DC
  Administered 2016-06-14 – 2016-06-16 (×4): 100 mg via ORAL
  Filled 2016-06-14 (×4): qty 1

## 2016-06-14 MED ORDER — METOCLOPRAMIDE HCL 5 MG/ML IJ SOLN
10.0000 mg | Freq: Once | INTRAMUSCULAR | Status: DC | PRN
Start: 2016-06-14 — End: 2016-06-14

## 2016-06-14 MED ORDER — METHOCARBAMOL 500 MG PO TABS
500.0000 mg | ORAL_TABLET | Freq: Four times a day (QID) | ORAL | Status: DC | PRN
  Administered 2016-06-16: 500 mg via ORAL
  Filled 2016-06-14: qty 1

## 2016-06-14 MED ORDER — FERROUS SULFATE 325 (65 FE) MG PO TABS
325.0000 mg | ORAL_TABLET | Freq: Three times a day (TID) | ORAL | Status: DC
  Administered 2016-06-15 – 2016-06-16 (×3): 325 mg via ORAL
  Filled 2016-06-14 (×3): qty 1

## 2016-06-14 MED ORDER — CEFAZOLIN SODIUM-DEXTROSE 2-4 GM/100ML-% IV SOLN
INTRAVENOUS | Status: AC
  Filled 2016-06-14: qty 100

## 2016-06-14 MED ORDER — PANTOPRAZOLE SODIUM 40 MG PO TBEC
40.0000 mg | DELAYED_RELEASE_TABLET | Freq: Every day | ORAL | Status: DC
  Administered 2016-06-15 – 2016-06-16 (×2): 40 mg via ORAL
  Filled 2016-06-14 (×2): qty 1

## 2016-06-14 MED ORDER — STERILE WATER FOR IRRIGATION IR SOLN
Status: DC | PRN
  Administered 2016-06-14: 3000 mL

## 2016-06-14 MED ORDER — TRANEXAMIC ACID 1000 MG/10ML IV SOLN
2000.0000 mg | Freq: Once | INTRAVENOUS | Status: DC
  Filled 2016-06-14: qty 20

## 2016-06-14 MED ORDER — MENTHOL 3 MG MT LOZG: 1.0000 | LOZENGE | OROMUCOSAL | Status: DC | PRN

## 2016-06-14 MED ORDER — CHLORHEXIDINE GLUCONATE 4 % EX LIQD: 60.0000 mL | Freq: Once | CUTANEOUS | Status: DC

## 2016-06-14 MED ORDER — PHENYLEPHRINE HCL 10 MG/ML IJ SOLN
INTRAVENOUS | Status: DC | PRN
  Administered 2016-06-14: 50 ug/min via INTRAVENOUS

## 2016-06-14 MED ORDER — METOCLOPRAMIDE HCL 5 MG/ML IJ SOLN: 5.0000 mg | Freq: Three times a day (TID) | INTRAMUSCULAR | Status: DC | PRN

## 2016-06-14 MED ORDER — FENTANYL CITRATE (PF) 100 MCG/2ML IJ SOLN
INTRAMUSCULAR | Status: DC | PRN
  Administered 2016-06-14: 25 ug via INTRAVENOUS
  Administered 2016-06-14: 50 ug via INTRAVENOUS
  Administered 2016-06-14: 25 ug via INTRAVENOUS

## 2016-06-14 MED ORDER — EPHEDRINE SULFATE-NACL 50-0.9 MG/10ML-% IV SOSY
PREFILLED_SYRINGE | INTRAVENOUS | Status: DC | PRN
  Administered 2016-06-14 (×4): 10 mg via INTRAVENOUS

## 2016-06-14 MED ORDER — BISACODYL 10 MG RE SUPP: 10.0000 mg | Freq: Every day | RECTAL | Status: DC | PRN

## 2016-06-14 MED ORDER — METOCLOPRAMIDE HCL 5 MG PO TABS: 5.0000 mg | ORAL_TABLET | Freq: Three times a day (TID) | ORAL | Status: DC | PRN

## 2016-06-14 MED ORDER — LATANOPROST 0.005 % OP SOLN
1.0000 [drp] | Freq: Every day | OPHTHALMIC | Status: DC
  Administered 2016-06-14 – 2016-06-15 (×2): 1 [drp] via OPHTHALMIC
  Filled 2016-06-14: qty 2.5

## 2016-06-14 MED ORDER — PROPOFOL 500 MG/50ML IV EMUL
INTRAVENOUS | Status: DC | PRN
  Administered 2016-06-14: 75 ug/kg/min via INTRAVENOUS

## 2016-06-14 MED ORDER — MIDAZOLAM HCL 5 MG/5ML IJ SOLN
INTRAMUSCULAR | Status: DC | PRN
  Administered 2016-06-14: 2 mg via INTRAVENOUS

## 2016-06-14 MED ORDER — EPHEDRINE 5 MG/ML INJ
INTRAVENOUS | Status: AC
  Filled 2016-06-14: qty 10

## 2016-06-14 MED ORDER — LEVOTHYROXINE SODIUM 75 MCG PO TABS
75.0000 ug | ORAL_TABLET | Freq: Every day | ORAL | Status: DC
  Administered 2016-06-15 – 2016-06-16 (×2): 75 ug via ORAL
  Filled 2016-06-14 (×2): qty 1

## 2016-06-14 MED ORDER — MEPERIDINE HCL 50 MG/ML IJ SOLN: 6.2500 mg | INTRAMUSCULAR | Status: DC | PRN

## 2016-06-14 MED ORDER — CEFAZOLIN SODIUM-DEXTROSE 2-4 GM/100ML-% IV SOLN
2.0000 g | INTRAVENOUS | Status: AC
  Administered 2016-06-14: 2 g via INTRAVENOUS
  Filled 2016-06-14: qty 100

## 2016-06-14 MED ORDER — DIPHENHYDRAMINE HCL 25 MG PO CAPS: 25.0000 mg | ORAL_CAPSULE | Freq: Four times a day (QID) | ORAL | Status: DC | PRN

## 2016-06-14 MED ORDER — MIDAZOLAM HCL 2 MG/2ML IJ SOLN
INTRAMUSCULAR | Status: AC
  Filled 2016-06-14: qty 2

## 2016-06-14 MED ORDER — FENTANYL CITRATE (PF) 100 MCG/2ML IJ SOLN
INTRAMUSCULAR | Status: AC
  Filled 2016-06-14: qty 2

## 2016-06-14 SURGICAL SUPPLY — 41 items
ADH SKN CLS APL DERMABOND .7 (GAUZE/BANDAGES/DRESSINGS) ×1
BAG DECANTER FOR FLEXI CONT (MISCELLANEOUS) ×2 IMPLANT
BAG SPEC THK2 15X12 ZIP CLS (MISCELLANEOUS)
BAG ZIPLOCK 12X15 (MISCELLANEOUS) IMPLANT
BLADE SAG 18X100X1.27 (BLADE) ×2 IMPLANT
BLADE SAW SGTL 18X1.27X75 (BLADE) ×2 IMPLANT
BLADE SAW SGTL 18X1.27X75MM (BLADE) ×1
CAPT HIP TOTAL 2 ×2 IMPLANT
CLOTH BEACON ORANGE TIMEOUT ST (SAFETY) ×3 IMPLANT
COVER PERINEAL POST (MISCELLANEOUS) ×3 IMPLANT
DERMABOND ADVANCED (GAUZE/BANDAGES/DRESSINGS) ×2
DERMABOND ADVANCED .7 DNX12 (GAUZE/BANDAGES/DRESSINGS) ×1 IMPLANT
DRAPE STERI IOBAN 125X83 (DRAPES) ×3 IMPLANT
DRAPE U-SHAPE 47X51 STRL (DRAPES) ×6 IMPLANT
DRESSING AQUACEL AG SP 3.5X10 (GAUZE/BANDAGES/DRESSINGS) ×1 IMPLANT
DRSG AQUACEL AG ADV 3.5X10 (GAUZE/BANDAGES/DRESSINGS) ×2 IMPLANT
DRSG AQUACEL AG SP 3.5X10 (GAUZE/BANDAGES/DRESSINGS) ×3
DURAPREP 26ML APPLICATOR (WOUND CARE) ×3 IMPLANT
ELECT REM PT RETURN 15FT ADLT (MISCELLANEOUS) IMPLANT
ELECT REM PT RETURN 9FT ADLT (ELECTROSURGICAL) ×3
ELECTRODE REM PT RTRN 9FT ADLT (ELECTROSURGICAL) ×1 IMPLANT
GLOVE BIOGEL M STRL SZ7.5 (GLOVE) ×4 IMPLANT
GLOVE BIOGEL PI IND STRL 7.5 (GLOVE) ×1 IMPLANT
GLOVE BIOGEL PI IND STRL 8.5 (GLOVE) ×1 IMPLANT
GLOVE BIOGEL PI INDICATOR 7.5 (GLOVE) ×2
GLOVE BIOGEL PI INDICATOR 8.5 (GLOVE) ×2
GLOVE ECLIPSE 8.0 STRL XLNG CF (GLOVE) ×6 IMPLANT
GLOVE ORTHO TXT STRL SZ7.5 (GLOVE) ×3 IMPLANT
GOWN STRL REUS W/TWL LRG LVL3 (GOWN DISPOSABLE) ×3 IMPLANT
GOWN STRL REUS W/TWL XL LVL3 (GOWN DISPOSABLE) ×3 IMPLANT
HOLDER FOLEY CATH W/STRAP (MISCELLANEOUS) ×3 IMPLANT
PACK ANTERIOR HIP CUSTOM (KITS) ×3 IMPLANT
SUT MNCRL AB 4-0 PS2 18 (SUTURE) ×3 IMPLANT
SUT VIC AB 1 CT1 36 (SUTURE) ×9 IMPLANT
SUT VIC AB 2-0 CT1 27 (SUTURE) ×9
SUT VIC AB 2-0 CT1 TAPERPNT 27 (SUTURE) ×2 IMPLANT
SUT VLOC 180 0 24IN GS25 (SUTURE) ×3 IMPLANT
TRAY FOLEY W/METER SILVER 14FR (SET/KITS/TRAYS/PACK) ×2 IMPLANT
TRAY FOLEY W/METER SILVER 16FR (SET/KITS/TRAYS/PACK) IMPLANT
WATER STERILE IRR 1500ML POUR (IV SOLUTION) ×3 IMPLANT
YANKAUER SUCT BULB TIP 10FT TU (MISCELLANEOUS) IMPLANT

## 2016-06-14 NOTE — Discharge Instructions (Signed)

## 2016-06-14 NOTE — Interval H&P Note (Signed)
History and Physical Interval Note:  06/14/2016 1:39 PM  Lauren Clark  has presented today for surgery, with the diagnosis of RIGHT HIP OA  The various methods of treatment have been discussed with the patient and family. After consideration of risks, benefits and other options for treatment, the patient has consented to  Procedure(s): RIGHT TOTAL HIP ARTHROPLASTY ANTERIOR APPROACH (Right) as a surgical intervention .  The patient's history has been reviewed, patient examined, no change in status, stable for surgery.  I have reviewed the patient's chart and labs.  Questions were answered to the patient's satisfaction.     Shelda PalLIN,Cruz Devilla D

## 2016-06-14 NOTE — Transfer of Care (Signed)
Immediate Anesthesia Transfer of Care Note  Patient: Lauren Clark  Procedure(s) Performed: Procedure(s): RIGHT TOTAL HIP ARTHROPLASTY ANTERIOR APPROACH (Right)  Patient Location: PACU  Anesthesia Type:Spinal  Level of Consciousness:  sedated, patient cooperative and responds to stimulation  Airway & Oxygen Therapy:Patient Spontanous Breathing and Patient connected to face mask oxgen  Post-op Assessment:  Report given to PACU RN and Post -op Vital signs reviewed and stable  Post vital signs:  Reviewed and stable  Last Vitals:  Vitals:   06/14/16 1217  BP: (!) 159/88  Pulse: 71  Resp: 18  Temp: 36.9 C    Complications: No apparent anesthesia complications

## 2016-06-14 NOTE — Anesthesia Procedure Notes (Signed)
Spinal  Start time: 06/14/2016 2:55 PM End time: 06/14/2016 3:00 PM Staffing Anesthesiologist: Phillips GroutARIGNAN, PETER Resident/CRNA: Early OsmondEARGLE, Anida Deol E Performed: resident/CRNA  Preanesthetic Checklist Completed: patient identified, site marked, surgical consent, pre-op evaluation, timeout performed, IV checked, risks and benefits discussed and monitors and equipment checked Spinal Block Patient position: sitting Prep: DuraPrep Patient monitoring: heart rate, continuous pulse ox and blood pressure Approach: midline Location: L3-4 Injection technique: single-shot Needle Needle type: Pencan  Needle gauge: 25 G Needle length: 9 cm Assessment Sensory level: T6 Additional Notes Time out performed, sterile prep and drape. First attempt, clear CSF, Neg heme, neg paresthesia. Tol well, return to supine.

## 2016-06-14 NOTE — Anesthesia Preprocedure Evaluation (Signed)
Anesthesia Evaluation  Patient identified by MRN, date of birth, ID band Patient awake    Reviewed: Allergy & Precautions, NPO status , Patient's Chart, lab work & pertinent test results  Airway Mallampati: II  TM Distance: >3 FB Neck ROM: Full    Dental no notable dental hx.    Pulmonary neg pulmonary ROS,    Pulmonary exam normal breath sounds clear to auscultation       Cardiovascular hypertension, Pt. on medications Normal cardiovascular exam Rhythm:Regular Rate:Normal     Neuro/Psych negative neurological ROS  negative psych ROS   GI/Hepatic negative GI ROS, Neg liver ROS,   Endo/Other  negative endocrine ROS  Renal/GU negative Renal ROS  negative genitourinary   Musculoskeletal negative musculoskeletal ROS (+)   Abdominal   Peds negative pediatric ROS (+)  Hematology negative hematology ROS (+)   Anesthesia Other Findings   Reproductive/Obstetrics negative OB ROS                             Anesthesia Physical  Anesthesia Plan  ASA: II  Anesthesia Plan: Spinal   Post-op Pain Management:    Induction:   Airway Management Planned: Simple Face Mask  Additional Equipment:   Intra-op Plan:   Post-operative Plan:   Informed Consent: I have reviewed the patients History and Physical, chart, labs and discussed the procedure including the risks, benefits and alternatives for the proposed anesthesia with the patient or authorized representative who has indicated his/her understanding and acceptance.   Dental advisory given  Plan Discussed with: CRNA  Anesthesia Plan Comments:         Anesthesia Quick Evaluation  

## 2016-06-14 NOTE — Op Note (Signed)
NAME:  Lauren Clark                ACCOUNT NO.: [id number]      MEDICAL RECORD NO.: [id number]      [facility]      PHYSICIAN:  Durene RomansLIN,Hridaan Bouse D  DATE OF BIRTH:  12-27-1955     DATE OF PROCEDURE:  06/14/2016                                 OPERATIVE REPORT         PREOPERATIVE DIAGNOSIS: Right  hip osteoarthritis.      POSTOPERATIVE DIAGNOSIS:  Right hip osteoarthritis.      PROCEDURE:  Right total hip replacement through an anterior approach   utilizing DePuy THR system, component size 50mm pinnacle cup, a size 32+4 neutral   Altrex liner, a size 4 Hi Tri Lock stem with a 32+1 delta ceramic   ball.      SURGEON:  Madlyn FrankelMatthew D. Charlann Boxerlin, M.D.      ASSISTANT:  Skip MayerBlair Roberts, PA-C     ANESTHESIA:  Spinal.      SPECIMENS:  None.      COMPLICATIONS:  None.      BLOOD LOSS:  600 cc     DRAINS:  None      INDICATION OF THE PROCEDURE:  Lauren SonWaneta B Bangura is a 60 y.o. female who had   presented to office for evaluation of right hip pain.  Radiographs revealed   progressive degenerative changes with bone-on-bone   articulation to the  hip joint.  The patient had painful limited range of   motion significantly affecting their overall quality of life.  The patient was failing to    respond to conservative measures, and at this point was ready   to proceed with more definitive measures.  The patient has noted progressive   degenerative changes in his hip, progressive problems and dysfunction   with regarding the hip prior to surgery.  Consent was obtained for   benefit of pain relief.  Specific risk of infection, DVT, component   failure, dislocation, need for revision surgery, as well discussion of   the anterior versus posterior approach were reviewed.  Consent was   obtained for benefit of anterior pain relief through an anterior   approach.      PROCEDURE IN DETAIL:  The patient was brought to operative theater.   Once adequate anesthesia, preoperative  antibiotics, 2gm of Ancef, 1 gm of Tranexamic Acid, and 10 mg of Decadron administered.   The patient was positioned supine on the OSI Hanna table.  Once adequate   padding of boney process was carried out, we had predraped out the hip, and  used fluoroscopy to confirm orientation of the pelvis and position.      The right hip was then prepped and draped from proximal iliac crest to   mid thigh with shower curtain technique.      Time-out was performed identifying the patient, planned procedure, and   extremity.     An incision was then made 2 cm distal and lateral to the   anterior superior iliac spine extending over the orientation of the   tensor fascia lata muscle and sharp dissection was carried down to the   fascia of the muscle and protractor placed in the soft tissues.      The fascia was  then incised.  The muscle belly was identified and swept   laterally and retractor placed along the superior neck.  Following   cauterization of the circumflex vessels and removing some pericapsular   fat, a second cobra retractor was placed on the inferior neck.  A third   retractor was placed on the anterior acetabulum after elevating the   anterior rectus.  A L-capsulotomy was along the line of the   superior neck to the trochanteric fossa, then extended proximally and   distally.  Tag sutures were placed and the retractors were then placed   intracapsular.  We then identified the trochanteric fossa and   orientation of my neck cut, confirmed this radiographically   and then made a neck osteotomy with the femur on traction.  The femoral   head was removed without difficulty or complication.  Traction was let   off and retractors were placed posterior and anterior around the   acetabulum.      The labrum and foveal tissue were debrided.  I began reaming with a 44mm   reamer and reamed up to 49mm reamer with good bony bed preparation and a 50mm   cup was chosen.  The final 50mm Pinnacle cup  was then impacted under fluoroscopy  to confirm the depth of penetration and orientation with respect to   abduction.  A screw was placed followed by the hole eliminator.  The final   32+4 neutral Altrex liner was impacted with good visualized rim fit.  The cup was positioned anatomically within the acetabular portion of the pelvis.      At this point, the femur was rolled at 80 degrees.  Further capsule was   released off the inferior aspect of the femoral neck.  I then   released the superior capsule proximally.  The hook was placed laterally   along the femur and elevated manually and held in position with the bed   hook.  The leg was then extended and adducted with the leg rolled to 100   degrees of external rotation.  Once the proximal femur was fully   exposed, I used a box osteotome to set orientation.  I then began   broaching with the starting chili pepper broach and passed this by hand and then broached up to 4.  With the 4 broach in place I chose a high offset neck and did several trial reduction.  The offset was appropriate, leg lengths   appeared to be equal best matched with the +1 head ball confirmed radiographically.   Given these findings, I went ahead and dislocated the hip, repositioned all   retractors and positioned the right hip in the extended and abducted position.  The final 4 Hi Tri Lock stem was   chosen and it was impacted down to the level of neck cut.  Based on this   and the trial reduction, a 32+1 delta ceramic ball was chosen and   impacted onto a clean and dry trunnion, and the hip was reduced.  The   hip had been irrigated throughout the case again at this point.  I did   reapproximate the superior capsular leaflet to the anterior leaflet   using #1 Vicryl.  The fascia of the   tensor fascia lata muscle was then reapproximated using #1 Vicryl and #0 V-lock sutures.  The   remaining wound was closed with 2-0 Vicryl and running 4-0 Monocryl.   The hip was  cleaned, dried, and  dressed sterilely using Dermabond and   Aquacel dressing.  She was then brought   to recovery room in stable condition tolerating the procedure well.    Skip Mayer, PA-C was present for the entirety of the case involved from   preoperative positioning, perioperative retractor management, general   facilitation of the case, as well as primary wound closure as assistant.            Madlyn Frankel Charlann Boxer, M.D.        06/14/2016 4:30 PM

## 2016-06-14 NOTE — Anesthesia Postprocedure Evaluation (Signed)
Anesthesia Post Note  Patient: Lauren Clark  Procedure(s) Performed: Procedure(s) (LRB): RIGHT TOTAL HIP ARTHROPLASTY ANTERIOR APPROACH (Right)  Patient location during evaluation: PACU Anesthesia Type: Spinal Level of consciousness: awake and alert, oriented and patient cooperative Pain management: pain level controlled Vital Signs Assessment: post-procedure vital signs reviewed and stable Respiratory status: spontaneous breathing, nonlabored ventilation, respiratory function stable and patient connected to nasal cannula oxygen Cardiovascular status: blood pressure returned to baseline and stable Postop Assessment: spinal receding and no signs of nausea or vomiting Anesthetic complications: no    Last Vitals:  Vitals:   06/14/16 1845 06/14/16 1900  BP: 133/74 120/79  Pulse: 67 72  Resp: 12 16  Temp:  36.5 C    Last Pain:  Vitals:   06/14/16 1900  TempSrc:   PainSc: 4                  Lola Czerwonka,E. Lucendia Leard

## 2016-06-15 ENCOUNTER — Encounter (HOSPITAL_COMMUNITY): Payer: Self-pay | Admitting: Orthopedic Surgery

## 2016-06-15 LAB — BASIC METABOLIC PANEL
Anion gap: 6 (ref 5–15)
BUN: 22 mg/dL — AB (ref 6–20)
CHLORIDE: 104 mmol/L (ref 101–111)
CO2: 27 mmol/L (ref 22–32)
Calcium: 8.7 mg/dL — ABNORMAL LOW (ref 8.9–10.3)
Creatinine, Ser: 1 mg/dL (ref 0.44–1.00)
GFR calc Af Amer: >60 mL/min (ref >60)
GFR calc non Af Amer: 60 mL/min — ABNORMAL LOW (ref >60)
Glucose, Bld: 188 mg/dL — ABNORMAL HIGH (ref 65–99)
POTASSIUM: 4.4 mmol/L (ref 3.5–5.1)
SODIUM: 137 mmol/L (ref 135–145)

## 2016-06-15 LAB — CBC
HCT: 32.7 % — ABNORMAL LOW (ref 36.0–46.0)
HEMOGLOBIN: 11.1 g/dL — AB (ref 12.0–15.0)
MCH: 31.8 pg (ref 26.0–34.0)
MCHC: 33.9 g/dL (ref 30.0–36.0)
MCV: 93.7 fL (ref 78.0–100.0)
Platelets: 201 10*3/uL (ref 150–400)
RBC: 3.49 MIL/uL — AB (ref 3.87–5.11)
RDW: 13.2 % (ref 11.5–15.5)
WBC: 18.6 10*3/uL — ABNORMAL HIGH (ref 4.0–10.5)

## 2016-06-15 MED ORDER — METHOCARBAMOL 500 MG PO TABS: 500.0000 mg | ORAL_TABLET | Freq: Four times a day (QID) | ORAL | 0 refills | Status: DC | PRN

## 2016-06-15 MED ORDER — HYDROMORPHONE HCL 2 MG/ML IJ SOLN: 0.5000 mg | INTRAMUSCULAR | Status: DC | PRN

## 2016-06-15 MED ORDER — HYDROCODONE-ACETAMINOPHEN 7.5-325 MG PO TABS: 1.0000 | ORAL_TABLET | ORAL | 0 refills | Status: DC | PRN

## 2016-06-15 MED ORDER — FERROUS SULFATE 325 (65 FE) MG PO TABS: 325.0000 mg | ORAL_TABLET | Freq: Three times a day (TID) | ORAL | 3 refills | Status: DC

## 2016-06-15 MED ORDER — POLYETHYLENE GLYCOL 3350 17 G PO PACK: 17.0000 g | PACK | Freq: Two times a day (BID) | ORAL | 0 refills | Status: DC

## 2016-06-15 MED ORDER — ASPIRIN 81 MG PO CHEW: 81.0000 mg | CHEWABLE_TABLET | Freq: Two times a day (BID) | ORAL | 0 refills | Status: AC

## 2016-06-15 MED ORDER — DOCUSATE SODIUM 100 MG PO CAPS: 100.0000 mg | ORAL_CAPSULE | Freq: Two times a day (BID) | ORAL | 0 refills | Status: DC

## 2016-06-15 NOTE — Evaluation (Signed)
Physical Therapy Evaluation Patient Details Name: Lauren Clark MRN: [id number] DOB: [birth year] Today's Date: 06/15/2016   History of Present Illness  s/p R DA THA  Clinical Impression  Pt s/p R THR presents with decreased R LE strength/ROM and post op pain limiting functional mobility.  Pt plans dc home with assist of neighbor and HHPT follow up.    Follow Up Recommendations Home health PT    Equipment Recommendations  None recommended by PT    Recommendations for Other Services OT consult     Precautions / Restrictions Precautions Precautions: Fall Precaution Comments: Legally blind Restrictions Weight Bearing Restrictions: No      Mobility  Bed Mobility Overal bed mobility: Needs Assistance Bed Mobility: Supine to Sit     Supine to sit: Min assist     General bed mobility comments: cues for sequence and use of L LE to self assist.  Pt utilizing bed rail and states "I use the bathroom doorknob at home"  Transfers Overall transfer level: Needs assistance Equipment used: Rolling walker (2 wheeled) Transfers: Sit to/from Stand Sit to Stand: Min assist         General transfer comment: cues for LE management and use of UEs to self assist  Ambulation/Gait Ambulation/Gait assistance: Min assist;Min guard Ambulation Distance (Feet): 100 Feet Assistive device: Rolling walker (2 wheeled) Gait Pattern/deviations: Step-to pattern;Step-through pattern;Decreased step length - right;Decreased step length - left;Shuffle;Trunk flexed Gait velocity: decr Gait velocity interpretation: Below normal speed for age/gender General Gait Details: cues for posture, position from RW, to increase BOS, for ER on R and for initial sequence  Stairs            Wheelchair Mobility    Modified Rankin (Stroke Patients Only)       Balance                                             Pertinent Vitals/Pain Pain Assessment: 0-10 Pain Score: 1  Pain  Location: R hip Pain Descriptors / Indicators: Discomfort Pain Intervention(s): Limited activity within patient's tolerance;Monitored during session;Premedicated before session;Ice applied    Home Living Family/patient expects to be discharged to:: Private residence Living Arrangements: Alone Available Help at Discharge: Family;Friend(s) Type of Home: House Home Access: Stairs to enter Entrance Stairs-Rails: Doctor, general practiceight;Left Entrance Stairs-Number of Steps: 5 Home Layout: One level Home Equipment: Environmental consultantWalker - 2 wheels Additional Comments: sink nex to commode    Prior Function Level of Independence: Independent               Hand Dominance        Extremity/Trunk Assessment   Upper Extremity Assessment Upper Extremity Assessment: Overall WFL for tasks assessed    Lower Extremity Assessment Lower Extremity Assessment: RLE deficits/detail RLE Deficits / Details: Strength at hip 2+/5 with AAROM at hip to 75 flex and 10 abd       Communication   Communication: No difficulties  Cognition Arousal/Alertness: Awake/alert Behavior During Therapy: WFL for tasks assessed/performed Overall Cognitive Status: Within Functional Limits for tasks assessed                      General Comments      Exercises Total Joint Exercises Ankle Circles/Pumps: AROM;Both;15 reps;Supine Quad Sets: AROM;Both;10 reps;Supine Heel Slides: AAROM;Right;Supine;20 reps Hip ABduction/ADduction: AAROM;Right;15 reps;Supine   Assessment/Plan    PT Assessment Patient  needs continued PT services  PT Problem List Decreased strength;Decreased range of motion;Decreased activity tolerance;Decreased mobility;Obesity;Decreased knowledge of use of DME;Pain          PT Treatment Interventions DME instruction;Gait training;Stair training;Functional mobility training;Therapeutic activities;Therapeutic exercise;Patient/family education    PT Goals (Current goals can be found in the Care Plan section)   Acute Rehab PT Goals Patient Stated Goal: home PT Goal Formulation: With patient Time For Goal Achievement: 06/18/16 Potential to Achieve Goals: Good    Frequency 7X/week   Barriers to discharge Decreased caregiver support Has neighbor that will be checking on her    Co-evaluation               End of Session Equipment Utilized During Treatment: Gait belt Activity Tolerance: Patient tolerated treatment well Patient left: with call bell/phone within reach;in chair;with chair alarm set Nurse Communication: Mobility status         Time: 0910-0950 PT Time Calculation (min) (ACUTE ONLY): 40 min   Charges:   PT Evaluation $PT Eval Low Complexity: 1 Procedure PT Treatments $Gait Training: 8-22 mins $Therapeutic Exercise: 8-22 mins   PT G Codes:        Vasilis Luhman 06/15/2016, 12:40 PM

## 2016-06-15 NOTE — Progress Notes (Signed)
     Subjective: 1 Day Post-Op Procedure(s) (LRB): RIGHT TOTAL HIP ARTHROPLASTY ANTERIOR APPROACH (Right)   Patient reports pain as mild, pain controlled. No events throughout the night.    Objective:   VITALS:   Vitals:   06/15/16 0202 06/15/16 0504  BP: 127/67 132/66  Pulse: 77 72  Resp: 16 16  Temp: 98.8 F (37.1 C) 98.2 F (36.8 C)    Dorsiflexion/Plantar flexion intact Incision: dressing C/D/I No cellulitis present Compartment soft  LABS  Recent Labs  06/15/16 0409  HGB 11.1*  HCT 32.7*  WBC 18.6*  PLT 201     Recent Labs  06/15/16 0409  NA 137  K 4.4  BUN 22*  CREATININE 1.00  GLUCOSE 188*     Assessment/Plan: 1 Day Post-Op Procedure(s) (LRB): RIGHT TOTAL HIP ARTHROPLASTY ANTERIOR APPROACH (Right) Foley cath d/c'ed Advance diet Up with therapy D/C IV fluids Discharge home eventually, when ready  Obese (BMI 30-39.9) Estimated body mass index is 36.91 kg/m as calculated from the following:   Height as of this encounter: 5' (1.524 m).   Weight as of this encounter: 85.7 kg (189 lb). Patient also counseled that weight may inhibit the healing process Patient counseled that losing weight will help with future health issues      Anastasio AuerbachMatthew S. Keeghan Mcintire   PAC  06/15/2016, 9:09 AM

## 2016-06-15 NOTE — Progress Notes (Signed)
Patient admitted 06/14/16, following a Right Total Hip Replacement, anterior approach with Dr. Charlann Boxerlin yesterday.   Patient has completed 2 PT sessions today, and did well. Patient sitting in chair during RN roundings today, and patient resting comfortably. No orientation changes have occurred during patients stay. Patient has been alert and oriented to time, place, situation, and self during her stay.   Patients chair alarm heard going off, and NT, Krsita Ezzelle responded. Patient found on the floor at 1700.    Myrlene BrokerAlejandra, NT stayed with patient and Dot LanesKrista NT came to find RN. I came to room, and assisted patient to a more comforable position. VS assessed and were 156/94 (R arm/sitting on floor), 92HR, 16RR, 96%RA. Patient rated her pain a level of 4/10 at this time. Also stated that it is not any worse than earlier today.  Patient denied hitting her head when she fell. Patient is alert and oriented at 1700.   Patient then assisted back to bed at 1708 by Faylene MillionAlejandra, Krista, and myself.   Patient tearful and upset. RN asked if she was in pain, patient denied worsening pain, just that she was scared and shook up from falling.  MD on call paged at 1722. MD returned page at 1735, and MD was informed of patients surgery, attending MD name, and date of admission. MD was then informed of how the patient fell.  RN repeated patients vital signs to him, stated that patient did not hit her head, and that no shortening or internal rotation was noted at this time. MD then asked if patient could move her leg ok, or if there was any excess discomfort. Patient denied both, and has good mobility with leg.   AC, Tammy informed of patient fall at 1745, and direct supervisor was informed at 1800.   Fall huddle with staff was then preformed at 1800.

## 2016-06-15 NOTE — Progress Notes (Signed)
Physical Therapy Treatment Patient Details Name: Lauren Clark MRN: [id number] DOB: [birth year] Today's Date: 06/15/2016    History of Present Illness s/p R DA THA    PT Comments    Pt motivated and progressing well with mobility.  Reviewed stairs and car transfers.  Follow Up Recommendations  Home health PT     Equipment Recommendations  None recommended by PT    Recommendations for Other Services OT consult     Precautions / Restrictions Precautions Precautions: Fall Precaution Comments: Legally blind Restrictions Weight Bearing Restrictions: No Other Position/Activity Restrictions: WBAT    Mobility  Bed Mobility Overal bed mobility: Needs Assistance Bed Mobility: Supine to Sit     Supine to sit: Supervision     General bed mobility comments: Pt unassisted to EOB  Transfers Overall transfer level: Needs assistance Equipment used: Rolling walker (2 wheeled) Transfers: Sit to/from Stand Sit to Stand: Min guard         General transfer comment: cues for LE management and use of UEs to self assist  Ambulation/Gait Ambulation/Gait assistance: Min guard Ambulation Distance (Feet): 140 Feet Assistive device: Rolling walker (2 wheeled) Gait Pattern/deviations: Step-to pattern;Step-through pattern;Decreased step length - right;Decreased step length - left;Shuffle;Trunk flexed Gait velocity: decr Gait velocity interpretation: Below normal speed for age/gender General Gait Details: cues for posture, position from RW, to increase BOS, for ER on R and for initial sequence   Stairs Stairs: Yes   Stair Management: Two rails;Step to pattern;Forwards Number of Stairs: 5 General stair comments: cues for sequence and foot placement  Wheelchair Mobility    Modified Rankin (Stroke Patients Only)       Balance                                    Cognition Arousal/Alertness: Awake/alert Behavior During Therapy: WFL for tasks  assessed/performed Overall Cognitive Status: Within Functional Limits for tasks assessed                      Exercises Total Joint Exercises Ankle Circles/Pumps: AROM;Both;15 reps;Supine Quad Sets: AROM;Both;10 reps;Supine Heel Slides: AAROM;Right;Supine;20 reps Hip ABduction/ADduction: AAROM;Right;15 reps;Supine    General Comments        Pertinent Vitals/Pain Pain Assessment: 0-10 Pain Score: 2  Pain Location: R hip Pain Descriptors / Indicators: Discomfort Pain Intervention(s): Limited activity within patient's tolerance;Monitored during session;Premedicated before session;Ice applied    Home Living Family/patient expects to be discharged to:: Private residence Living Arrangements: Alone Available Help at Discharge: Family;Friend(s) Type of Home: House Home Access: Stairs to enter Entrance Stairs-Rails: Right;Left Home Layout: One level Home Equipment: Environmental consultantWalker - 2 wheels Additional Comments: sink nex to commode    Prior Function Level of Independence: Independent          PT Goals (current goals can now be found in the care plan section) Acute Rehab PT Goals Patient Stated Goal: home PT Goal Formulation: With patient Time For Goal Achievement: 06/18/16 Potential to Achieve Goals: Good Progress towards PT goals: Progressing toward goals    Frequency    7X/week      PT Plan Current plan remains appropriate    Co-evaluation             End of Session Equipment Utilized During Treatment: Gait belt Activity Tolerance: Patient tolerated treatment well Patient left: in chair;with call bell/phone within reach;with chair alarm set     Time:  [id number] PT Time Calculation (min) (ACUTE ONLY): 25 min  Charges:  $Gait Training: 8-22 mins $Therapeutic Exercise: 8-22 mins $Therapeutic Activity: 8-22 mins                    G Codes:      Purcell Jungbluth 06/15/2016, 3:27 PM

## 2016-06-15 NOTE — Care Management Note (Signed)
Case Management Note  Patient Details  Name: Lauren Clark MRN: [id number] Date of Birth: February 03, 1956  Subjective/Objective:                  RIGHT TOTAL HIP ARTHROPLASTY ANTERIOR APPROACH (Right)  Action/Plan: Discharge planning Expected Discharge Date:  06/15/16               Expected Discharge Plan:  [place]  In-House Referral:     Discharge planning Services  CM Consult  Post Acute Care Choice:  Home Health Choice offered to:  Patient  DME Arranged:  N/A DME Agency:  NA  HH Arranged:  PT [place] Agency:  [facility]  Status of Service:  Completed, signed off  If discussed at [employer], dates discussed:    Additional Comments: CM met with pt in room to offer choice of home health agency.  Pt chooses [facility] to render HHPT.  Referral given to [facility] rep, Tim.  Pt states she has all DME needed at home. NO other CM needs were communicated. Dellie Catholic, RN 06/15/2016, 10:55 AM

## 2016-06-15 NOTE — Evaluation (Signed)
Occupational Therapy Evaluation Patient Details Name: Lauren Clark MRN: [id number] DOB: April 22, 1956 Today's Date: 06/15/2016    History of Present Illness s/p R DA THA   Clinical Impression   This 60 year old female was admitted for the above sx.  She will be returning home alone with intermittent assistance from friends. Will follow in acute setting with supervision to mod I level goals    Follow Up Recommendations  Supervision - Intermittent    Equipment Recommendations  None recommended by OT (pt has a high toilet with sink next to it)    Recommendations for Other Services       Precautions / Restrictions Precautions Precautions: Fall Restrictions Weight Bearing Restrictions: No      Mobility Bed Mobility               General bed mobility comments: oob  Transfers Overall transfer level: Needs assistance Equipment used: Rolling walker (2 wheeled) Transfers: Sit to/from Stand Sit to Stand: Min guard         General transfer comment: for safety    Balance                                            ADL Overall ADL's : Needs assistance/impaired     Grooming: Wash/dry hands;Supervision/safety;Standing                   Toilet Transfer: Min guard;RW;Ambulation;Comfort height toilet             General ADL Comments: Pt has a reacher at home:  she will have intermittent assistance.  She may have someone put her ted hose and socks on and keep them on as she states she has a h/o DVT.  She believes bed mobility will be the most difficult thing for her.  Left demo leg lifter for her to try with PT when she gets back into bed. She states she has tried a sheet before and didn't have good results.  Pt had already completed ADL this am prior to OT arriving     Vision     Perception     Praxis      Pertinent Vitals/Pain Pain Assessment: 0-10 Pain Score: 1  Pain Location: R hip Pain Descriptors / Indicators:  Discomfort Pain Intervention(s): Limited activity within patient's tolerance;Monitored during session;Premedicated before session;Repositioned;Ice applied     Hand Dominance     Extremity/Trunk Assessment Upper Extremity Assessment Upper Extremity Assessment: Overall WFL for tasks assessed           Communication     Cognition Arousal/Alertness: Awake/alert Behavior During Therapy: WFL for tasks assessed/performed Overall Cognitive Status: Within Functional Limits for tasks assessed                     General Comments       Exercises       Shoulder Instructions      Home Living Family/patient expects to be discharged to:: Private residence Living Arrangements: Alone Available Help at Discharge: Family;Friend(s) Type of Home: House             Bathroom Shower/Tub: Chief Strategy OfficerTub/shower unit   Bathroom Toilet: Handicapped height     Home Equipment: Tub bench   Additional Comments: sink nex to commode      Prior Functioning/Environment Level of Independence: Independent  OT Problem List: Decreased strength;Pain   OT Treatment/Interventions: Self-care/ADL training;DME and/or AE instruction;Patient/family education    OT Goals(Current goals can be found in the care plan section) Acute Rehab OT Goals Patient Stated Goal: home OT Goal Formulation: With patient Time For Goal Achievement: 06/22/16 Potential to Achieve Goals: Good ADL Goals Pt Will Transfer to Toilet: with modified independence;ambulating (high commode with sink) Additional ADL Goal #1: pt will gather clothes with RW at supervision level and complete ADL (except for socks) without assistance Additional ADL Goal #2: pt will perform bed mobility with AE/adapted technique at mod I level in preparation for adls  OT Frequency: Min 2X/week   Barriers to D/C:            Co-evaluation              End of Session    Activity Tolerance: Patient tolerated treatment  well Patient left: in chair;with call bell/phone within reach;with chair alarm set   Time: [id number] OT Time Calculation (min): 18 min Charges:  OT General Charges $OT Visit: 1 Procedure OT Evaluation $OT Eval Low Complexity: 1 Procedure G-Codes:    Macintyre Alexa 06/15/2016, 12:15 PM Marica OtterMaryellen Johnna Bollier, OTR/L [phone] 06/15/2016

## 2016-06-16 LAB — CBC
HEMATOCRIT: 28.3 % — AB (ref 36.0–46.0)
HEMOGLOBIN: 9.7 g/dL — AB (ref 12.0–15.0)
MCH: 31.1 pg (ref 26.0–34.0)
MCHC: 34.3 g/dL (ref 30.0–36.0)
MCV: 90.7 fL (ref 78.0–100.0)
Platelets: 172 10*3/uL (ref 150–400)
RBC: 3.12 MIL/uL — AB (ref 3.87–5.11)
RDW: 13.1 % (ref 11.5–15.5)
WBC: 12.7 10*3/uL — AB (ref 4.0–10.5)

## 2016-06-16 LAB — BASIC METABOLIC PANEL
ANION GAP: 7 (ref 5–15)
BUN: 19 mg/dL (ref 6–20)
CALCIUM: 8.2 mg/dL — AB (ref 8.9–10.3)
CO2: 26 mmol/L (ref 22–32)
Chloride: 103 mmol/L (ref 101–111)
Creatinine, Ser: 0.94 mg/dL (ref 0.44–1.00)
GFR calc Af Amer: >60 mL/min (ref >60)
GLUCOSE: 117 mg/dL — AB (ref 65–99)
Potassium: 4.2 mmol/L (ref 3.5–5.1)
SODIUM: 136 mmol/L (ref 135–145)

## 2016-06-16 NOTE — Progress Notes (Signed)
Physical Therapy Treatment Patient Details Name: Lauren Clark MRN: [id number] DOB: 13-Jun-1956 Today's Date: 06/16/2016    History of Present Illness s/p R DA THA    PT Comments    Pt progressing well and eager for dc to home.    Follow Up Recommendations  Home health PT     Equipment Recommendations  None recommended by PT    Recommendations for Other Services OT consult     Precautions / Restrictions Precautions Precautions: Fall Precaution Comments: Legally blind Restrictions Weight Bearing Restrictions: No Other Position/Activity Restrictions: WBAT    Mobility  Bed Mobility Overal bed mobility: Needs Assistance Bed Mobility: Supine to Sit;Sit to Supine     Supine to sit: Modified independent (Device/Increase time) Sit to supine: Modified independent (Device/Increase time)   General bed mobility comments: pt sitting in chair  Transfers Overall transfer level: Needs assistance Equipment used: Rolling walker (2 wheeled) Transfers: Sit to/from UGI CorporationStand;Stand Pivot Transfers Sit to Stand: Supervision Stand pivot transfers: Supervision       General transfer comment: cues for LE management and use of UEs to self assist  Ambulation/Gait Ambulation/Gait assistance: Supervision Ambulation Distance (Feet): 200 Feet Assistive device: Rolling walker (2 wheeled) Gait Pattern/deviations: Step-to pattern;Step-through pattern;Decreased step length - right;Decreased step length - left;Shuffle;Trunk flexed Gait velocity: decr   General Gait Details: min cues for posture, position from RW, to increase BOS, for ER on R and for initial sequence   Stairs         General stair comments: Pt states she is comfortable with ability on stairs after practice yesterday  Wheelchair Mobility    Modified Rankin (Stroke Patients Only)       Balance                                    Cognition Arousal/Alertness: Awake/alert Behavior During Therapy: WFL  for tasks assessed/performed Overall Cognitive Status: Within Functional Limits for tasks assessed                      Exercises Total Joint Exercises Ankle Circles/Pumps: AROM;Both;15 reps;Supine Quad Sets: AROM;Both;10 reps;Supine Heel Slides: AAROM;Right;Supine;20 reps Hip ABduction/ADduction: AAROM;Right;15 reps;Supine Long Arc Quad: 10 reps;Seated;Right    General Comments        Pertinent Vitals/Pain Pain Assessment: 0-10 Pain Score: 3  Pain Location: r hip Pain Descriptors / Indicators: Discomfort Pain Intervention(s): Monitored during session    Home Living                      Prior Function            PT Goals (current goals can now be found in the care plan section) Acute Rehab PT Goals Patient Stated Goal: home PT Goal Formulation: With patient Time For Goal Achievement: 06/18/16 Potential to Achieve Goals: Good Progress towards PT goals: Progressing toward goals    Frequency    7X/week      PT Plan Current plan remains appropriate    Co-evaluation             End of Session Equipment Utilized During Treatment: Gait belt Activity Tolerance: Patient tolerated treatment well Patient left: in chair;with call bell/phone within reach;with chair alarm set     Time: 0820-0855 PT Time Calculation (min) (ACUTE ONLY): 35 min  Charges:  $Gait Training: 8-22 mins $Therapeutic Exercise: 23-37 mins  G Codes:      Lauren Clark 06/16/2016, 10:05 AM

## 2016-06-16 NOTE — Progress Notes (Signed)
Occupational Therapy Treatment Patient Details Name: Lyla SonWaneta B Brandner MRN: [id number] DOB: October 26, 1955 Today's Date: 06/16/2016    History of present illness s/p R DA THA   OT comments  Pt doing well. Pt has AE.  Pt will have limited A at home.  Pt demonstrated safety awareness with ADL activity !    Follow Up Recommendations  Supervision - Intermittent    Equipment Recommendations  None recommended by OT    Recommendations for Other Services      Precautions / Restrictions Precautions Precautions: Fall Precaution Comments: Legally blind Restrictions Weight Bearing Restrictions: No Other Position/Activity Restrictions: WBAT       Mobility Bed Mobility Overal bed mobility: Needs Assistance Bed Mobility: Supine to Sit;Sit to Supine     Supine to sit: Modified independent (Device/Increase time) Sit to supine: Modified independent (Device/Increase time)   General bed mobility comments: pt sitting in chair  Transfers Overall transfer level: Needs assistance Equipment used: Rolling walker (2 wheeled) Transfers: Sit to/from UGI CorporationStand;Stand Pivot Transfers Sit to Stand: Supervision Stand pivot transfers: Supervision       General transfer comment: cues for LE management and use of UEs to self assist        ADL Overall ADL's : Needs assistance/impaired                 Upper Body Dressing : Set up;Sitting   Lower Body Dressing: Minimal assistance;Sit to/from stand;Cueing for safety;Cueing for sequencing;Cueing for compensatory techniques;With adaptive equipment   Toilet Transfer: RW;Ambulation;Comfort height toilet;Supervision/safety   Toileting- Clothing Manipulation and Hygiene: Supervision/safety;Sit to/from stand;Cueing for safety;Cueing for sequencing     Tub/Shower Transfer Details (indicate cue type and reason): verbalized safety                    Cognition   Behavior During Therapy: WFL for tasks assessed/performed Overall Cognitive Status:  Within Functional Limits for tasks assessed                               General Comments      Pertinent Vitals/ Pain       Pain Assessment: 0-10 Pain Score: 3  Pain Location: r hip Pain Descriptors / Indicators: Discomfort Pain Intervention(s): Monitored during session         Frequency  Min 2X/week        Progress Toward Goals  OT Goals(current goals can now be found in the care plan section)  Progress towards OT goals: Progressing toward goals  Acute Rehab OT Goals Patient Stated Goal: home  Plan Discharge plan remains appropriate       End of Session Equipment Utilized During Treatment: Rolling walker   Activity Tolerance Patient tolerated treatment well   Patient Left in chair;with call bell/phone within reach   Nurse Communication Mobility status        Time: [id number] OT Time Calculation (min): 12 min  Charges: OT General Charges $OT Visit: 1 Procedure OT Treatments $Self Care/Home Management : 8-22 mins  Dessa Ledee D 06/16/2016, 10:05 AM

## 2016-06-16 NOTE — Discharge Summary (Signed)
Discharge teaching provided, including prescriptions, pain control, safety education, incision monitoring, and f/u appts. Pt attentive to all education and verbalized understanding, discharge paperwork signed and scripts provided. Transported out to private vehicle per wheelchair via nursing tech.

## 2016-06-16 NOTE — Progress Notes (Signed)
     Subjective: 2 Days Post-Op Procedure(s) (LRB): RIGHT TOTAL HIP ARTHROPLASTY ANTERIOR APPROACH (Right)   Patient reports pain as mild, pain controlled. No events throughout the night. She did have any incident where she fell out of the recliner yesterday, she is doing well with regards to that incident and no issues with the hip.  Ready to be discharged home.   Objective:   VITALS:   Vitals:   06/16/16 0241 06/16/16 0456  BP: (!) 108/55 (!) 125/55  Pulse: 77 73  Resp: 16 16  Temp: 99.3 F (37.4 C) 98.5 F (36.9 C)    Dorsiflexion/Plantar flexion intact Incision: dressing C/D/I No cellulitis present Compartment soft  LABS  Recent Labs  06/15/16 0409 06/16/16 0432  HGB 11.1* 9.7*  HCT 32.7* 28.3*  WBC 18.6* 12.7*  PLT 201 172     Recent Labs  06/15/16 0409 06/16/16 0432  NA 137 136  K 4.4 4.2  BUN 22* 19  CREATININE 1.00 0.94  GLUCOSE 188* 117*     Assessment/Plan: 2 Days Post-Op Procedure(s) (LRB): RIGHT TOTAL HIP ARTHROPLASTY ANTERIOR APPROACH (Right) Up with therapy Discharge home Follow up in 2 weeks at [facility]. Follow up with OLIN,Winnie Umali D in 2 weeks.  Contact information:  [facility] [address], [address] [place] [id number] [phone]        Anastasio AuerbachMatthew S. Shaivi Rothschild   PAC  06/16/2016, 7:31 AM

## 2016-06-18 DIAGNOSIS — Z471 Aftercare following joint replacement surgery: Secondary | ICD-10-CM | POA: Diagnosis not present

## 2016-06-18 DIAGNOSIS — F419 Anxiety disorder, unspecified: Secondary | ICD-10-CM | POA: Diagnosis not present

## 2016-06-18 DIAGNOSIS — I739 Peripheral vascular disease, unspecified: Secondary | ICD-10-CM | POA: Diagnosis not present

## 2016-06-18 DIAGNOSIS — Z96641 Presence of right artificial hip joint: Secondary | ICD-10-CM | POA: Diagnosis not present

## 2016-06-18 DIAGNOSIS — F329 Major depressive disorder, single episode, unspecified: Secondary | ICD-10-CM | POA: Diagnosis not present

## 2016-06-18 DIAGNOSIS — I10 Essential (primary) hypertension: Secondary | ICD-10-CM | POA: Diagnosis not present

## 2016-06-21 DIAGNOSIS — I739 Peripheral vascular disease, unspecified: Secondary | ICD-10-CM | POA: Diagnosis not present

## 2016-06-21 DIAGNOSIS — F329 Major depressive disorder, single episode, unspecified: Secondary | ICD-10-CM | POA: Diagnosis not present

## 2016-06-21 DIAGNOSIS — Z96641 Presence of right artificial hip joint: Secondary | ICD-10-CM | POA: Diagnosis not present

## 2016-06-21 DIAGNOSIS — I10 Essential (primary) hypertension: Secondary | ICD-10-CM | POA: Diagnosis not present

## 2016-06-21 DIAGNOSIS — Z471 Aftercare following joint replacement surgery: Secondary | ICD-10-CM | POA: Diagnosis not present

## 2016-06-21 DIAGNOSIS — F419 Anxiety disorder, unspecified: Secondary | ICD-10-CM | POA: Diagnosis not present

## 2016-06-22 ENCOUNTER — Ambulatory Visit: Payer: Commercial Managed Care - HMO | Admitting: Dietician

## 2016-06-23 DIAGNOSIS — Z471 Aftercare following joint replacement surgery: Secondary | ICD-10-CM | POA: Diagnosis not present

## 2016-06-23 DIAGNOSIS — F419 Anxiety disorder, unspecified: Secondary | ICD-10-CM | POA: Diagnosis not present

## 2016-06-23 DIAGNOSIS — I739 Peripheral vascular disease, unspecified: Secondary | ICD-10-CM | POA: Diagnosis not present

## 2016-06-23 DIAGNOSIS — I10 Essential (primary) hypertension: Secondary | ICD-10-CM | POA: Diagnosis not present

## 2016-06-23 DIAGNOSIS — Z96641 Presence of right artificial hip joint: Secondary | ICD-10-CM | POA: Diagnosis not present

## 2016-06-23 DIAGNOSIS — F329 Major depressive disorder, single episode, unspecified: Secondary | ICD-10-CM | POA: Diagnosis not present

## 2016-06-28 DIAGNOSIS — Z471 Aftercare following joint replacement surgery: Secondary | ICD-10-CM | POA: Diagnosis not present

## 2016-06-28 DIAGNOSIS — I739 Peripheral vascular disease, unspecified: Secondary | ICD-10-CM | POA: Diagnosis not present

## 2016-06-28 DIAGNOSIS — I10 Essential (primary) hypertension: Secondary | ICD-10-CM | POA: Diagnosis not present

## 2016-06-28 DIAGNOSIS — F419 Anxiety disorder, unspecified: Secondary | ICD-10-CM | POA: Diagnosis not present

## 2016-06-28 DIAGNOSIS — Z96641 Presence of right artificial hip joint: Secondary | ICD-10-CM | POA: Diagnosis not present

## 2016-06-28 DIAGNOSIS — F329 Major depressive disorder, single episode, unspecified: Secondary | ICD-10-CM | POA: Diagnosis not present

## 2016-06-30 ENCOUNTER — Encounter: Payer: Self-pay | Admitting: Dietician

## 2016-06-30 ENCOUNTER — Encounter: Payer: Commercial Managed Care - HMO | Attending: General Surgery | Admitting: Dietician

## 2016-06-30 DIAGNOSIS — E079 Disorder of thyroid, unspecified: Secondary | ICD-10-CM | POA: Diagnosis not present

## 2016-06-30 DIAGNOSIS — R7303 Prediabetes: Secondary | ICD-10-CM | POA: Diagnosis not present

## 2016-06-30 DIAGNOSIS — F329 Major depressive disorder, single episode, unspecified: Secondary | ICD-10-CM | POA: Diagnosis not present

## 2016-06-30 DIAGNOSIS — Z713 Dietary counseling and surveillance: Secondary | ICD-10-CM | POA: Insufficient documentation

## 2016-06-30 DIAGNOSIS — M1611 Unilateral primary osteoarthritis, right hip: Secondary | ICD-10-CM | POA: Insufficient documentation

## 2016-06-30 DIAGNOSIS — Z6837 Body mass index (BMI) 37.0-37.9, adult: Secondary | ICD-10-CM | POA: Diagnosis not present

## 2016-06-30 DIAGNOSIS — Z96641 Presence of right artificial hip joint: Secondary | ICD-10-CM | POA: Diagnosis not present

## 2016-06-30 DIAGNOSIS — F419 Anxiety disorder, unspecified: Secondary | ICD-10-CM | POA: Diagnosis not present

## 2016-06-30 DIAGNOSIS — Z471 Aftercare following joint replacement surgery: Secondary | ICD-10-CM | POA: Diagnosis not present

## 2016-06-30 DIAGNOSIS — I739 Peripheral vascular disease, unspecified: Secondary | ICD-10-CM | POA: Diagnosis not present

## 2016-06-30 DIAGNOSIS — I10 Essential (primary) hypertension: Secondary | ICD-10-CM | POA: Diagnosis not present

## 2016-06-30 NOTE — Progress Notes (Signed)
  Follow-up visit:  4 months Post-Operative Sleeve Gastrectomy Surgery  Medical Nutrition Therapy:  Appt start time: 240 end time:  255  Primary concerns today: Post-operative Bariatric Surgery Nutrition Management. Returns with a 3 lbs weight loss. However, per [place] body composition scale patient has lost fat and gained water, likely due to recent surgery. Had a hip replacement a few weeks ago; just finished physical therapy and looking forward to resuming water aerobics. Does not tolerate salad well.   Surgery date: 02/29/2016 Surgery type: sleeve gastrectomy [facility] lbs on 02/03/2016 (highest weight 260 lbs per patient) Weight today: 191 lbs Weight change: 3 lbs Total weight loss: 69 lbs  Weight loss goal: 160 lbs but "happy to be under 200 lbs"   TANITA  BODY COMP RESULTS  02/15/16 03/15/16 04/27/16 06/30/16   BMI (kg/m^2) 41.4 38.8 38.0 37.3   Fat Mass (lbs) 87.6 81.0 75.2 63.8   Fat Free Mass (lbs) 124.6 117.8 119.4 127.2   Total Body Water (lbs) 89.2 84.0 85.0 90.2    Preferred Learning Style:   No preference indicated   Learning Readiness:   Ready  24-hr recall: B (AM): Premier protein shake (30g) Snk (AM): Dannon yogurt (5g) L (PM): Wendy's chili (15g) Snk (PM): sometimes crackers or broth D (PM): 3-4 oz fish or beef and vegetable (21 g) Snk (PM): sometimes another protein shake  Fluid intake: 11-22 oz protein shake, water "all day long", unsweet decaf tea, half decaf coffee, skim milk Estimated total protein intake: over 60 grams per day  Medications: see list  Supplementation: taking  Using straws: No Drinking while eating: tries not to  Hair loss: No Carbonated beverages: No N/V/D/C: No Dumping syndrome: No  Recent physical activity:  None  Progress Towards Goal(s):  In progress.  Handouts given during visit include:  none   Nutritional Diagnosis:  [place]-3.3 Overweight/obesity related to past poor dietary habits and physical  inactivity as evidenced by patient w/ recent sleeve gastrectomy surgery following dietary guidelines for continued weight loss.    Intervention:  Nutrition counseling provided.  Teaching Method Utilized:  Visual Auditory Hands on  Barriers to learning/adherence to lifestyle change: none  Demonstrated degree of understanding via:  Teach Back   Monitoring/Evaluation:  Dietary intake, exercise, and body weight. Follow up in 6 months for 10 month post-op visit.

## 2016-06-30 NOTE — Patient Instructions (Addendum)
Goals:  Follow Phase 3B: High Protein + Non-Starchy Vegetables  Eat 3-6 small meals/snacks, every 3-5 hrs  Increase lean protein foods to meet 60g goal  Increase fluid intake to 64oz +  Avoid drinking 15 minutes before, during and 30 minutes after eating  Aim for >30 min of physical activity daily  Eating mostly protein and vegetables (limit crackers and cream of wheat)  Try chair exercises   Surgery date: 02/29/2016 Surgery type: [facility] gastrectomy [facility] lbs on 02/03/2016 (highest weight 260 lbs per patient) Weight today: 191 lbs Weight change: 3 lbs Total weight loss: 69 lbs  Weight loss goal: 160 lbs   TANITA  BODY COMP RESULTS  02/15/16 03/15/16 04/27/16 06/30/16   BMI (kg/m^2) 41.4 38.8 38.0 37.3   Fat Mass (lbs) 87.6 81.0 75.2 63.8   Fat Free Mass (lbs) 124.6 117.8 119.4 127.2   Total Body Water (lbs) 89.2 84.0 85.0 90.2

## 2016-07-27 DIAGNOSIS — Z96641 Presence of right artificial hip joint: Secondary | ICD-10-CM | POA: Diagnosis not present

## 2016-08-31 DIAGNOSIS — I10 Essential (primary) hypertension: Secondary | ICD-10-CM | POA: Diagnosis not present

## 2016-08-31 DIAGNOSIS — M1611 Unilateral primary osteoarthritis, right hip: Secondary | ICD-10-CM | POA: Diagnosis not present

## 2016-08-31 DIAGNOSIS — E669 Obesity, unspecified: Secondary | ICD-10-CM | POA: Diagnosis not present

## 2016-08-31 DIAGNOSIS — Z9884 Bariatric surgery status: Secondary | ICD-10-CM | POA: Diagnosis not present

## 2016-09-02 DIAGNOSIS — E782 Mixed hyperlipidemia: Secondary | ICD-10-CM | POA: Diagnosis not present

## 2016-09-02 DIAGNOSIS — R7301 Impaired fasting glucose: Secondary | ICD-10-CM | POA: Diagnosis not present

## 2016-09-02 DIAGNOSIS — I10 Essential (primary) hypertension: Secondary | ICD-10-CM | POA: Diagnosis not present

## 2016-09-05 DIAGNOSIS — E038 Other specified hypothyroidism: Secondary | ICD-10-CM | POA: Diagnosis not present

## 2016-09-05 DIAGNOSIS — R7301 Impaired fasting glucose: Secondary | ICD-10-CM | POA: Diagnosis not present

## 2016-09-05 DIAGNOSIS — I10 Essential (primary) hypertension: Secondary | ICD-10-CM | POA: Diagnosis not present

## 2016-09-05 DIAGNOSIS — F320 Major depressive disorder, single episode, mild: Secondary | ICD-10-CM | POA: Diagnosis not present

## 2016-09-05 DIAGNOSIS — E782 Mixed hyperlipidemia: Secondary | ICD-10-CM | POA: Diagnosis not present

## 2016-09-23 DIAGNOSIS — H4031X1 Glaucoma secondary to eye trauma, right eye, mild stage: Secondary | ICD-10-CM | POA: Diagnosis not present

## 2016-12-09 DIAGNOSIS — R7301 Impaired fasting glucose: Secondary | ICD-10-CM | POA: Diagnosis not present

## 2016-12-09 DIAGNOSIS — E559 Vitamin D deficiency, unspecified: Secondary | ICD-10-CM | POA: Diagnosis not present

## 2016-12-09 DIAGNOSIS — E038 Other specified hypothyroidism: Secondary | ICD-10-CM | POA: Diagnosis not present

## 2016-12-09 DIAGNOSIS — E782 Mixed hyperlipidemia: Secondary | ICD-10-CM | POA: Diagnosis not present

## 2016-12-09 DIAGNOSIS — I10 Essential (primary) hypertension: Secondary | ICD-10-CM | POA: Diagnosis not present

## 2016-12-12 DIAGNOSIS — E782 Mixed hyperlipidemia: Secondary | ICD-10-CM | POA: Diagnosis not present

## 2016-12-12 DIAGNOSIS — F320 Major depressive disorder, single episode, mild: Secondary | ICD-10-CM | POA: Diagnosis not present

## 2016-12-12 DIAGNOSIS — R7301 Impaired fasting glucose: Secondary | ICD-10-CM | POA: Diagnosis not present

## 2016-12-12 DIAGNOSIS — I10 Essential (primary) hypertension: Secondary | ICD-10-CM | POA: Diagnosis not present

## 2016-12-12 DIAGNOSIS — E038 Other specified hypothyroidism: Secondary | ICD-10-CM | POA: Diagnosis not present

## 2016-12-12 DIAGNOSIS — Z6835 Body mass index (BMI) 35.0-35.9, adult: Secondary | ICD-10-CM | POA: Diagnosis not present

## 2016-12-12 DIAGNOSIS — E559 Vitamin D deficiency, unspecified: Secondary | ICD-10-CM | POA: Diagnosis not present

## 2016-12-29 ENCOUNTER — Encounter: Payer: Self-pay | Admitting: Skilled Nursing Facility1

## 2016-12-29 ENCOUNTER — Encounter: Payer: Medicare HMO | Attending: General Surgery | Admitting: Skilled Nursing Facility1

## 2016-12-29 DIAGNOSIS — R7303 Prediabetes: Secondary | ICD-10-CM | POA: Diagnosis not present

## 2016-12-29 DIAGNOSIS — Z713 Dietary counseling and surveillance: Secondary | ICD-10-CM | POA: Diagnosis not present

## 2016-12-29 DIAGNOSIS — M1611 Unilateral primary osteoarthritis, right hip: Secondary | ICD-10-CM | POA: Diagnosis not present

## 2016-12-29 DIAGNOSIS — Z9884 Bariatric surgery status: Secondary | ICD-10-CM | POA: Diagnosis not present

## 2016-12-29 DIAGNOSIS — I10 Essential (primary) hypertension: Secondary | ICD-10-CM | POA: Diagnosis not present

## 2016-12-29 DIAGNOSIS — E669 Obesity, unspecified: Secondary | ICD-10-CM | POA: Diagnosis not present

## 2016-12-29 NOTE — Progress Notes (Signed)
Sleeve Gastrectomy Surgery  Medical Nutrition Therapy:  Appt start time: 240 end time:  255  Primary concerns today: Post-operative Bariatric Surgery Nutrition Management. Pt states her new hip is going well. Pt states she does eat protein for snacks but in recall no proteins were mentioned when this was clarified the pt just said "ya". Pt has lost 9.2 pounds of muscle and gained 4.8 pounds of fat: pt states she has done water aerobics 3 times a week for 1.5 to 2.5 hours every single week since February and states she is getting plenty of protein. When dietitian tried to delve deeper and retreive specific answers the pt regurgitated back the recommendations.  Pt states she is very happy with her weight loss and has absolutely no complaints. Dietitian discussed her supplements the pt is taking, she stated her doctor told her to take them in those amounts.   Surgery date: 02/29/2016 Surgery type: sleeve gastrectomy [facility] lbs on 02/03/2016 (highest weight 260 lbs per patient) Weight today: 186.6 lbs Weight change: 4.4 lbs  Weight loss goal: 160 lbs but "happy to be under 200 lbs"   TANITA  BODY COMP RESULTS  02/15/16 03/15/16 04/27/16 06/30/16 12/29/2016   BMI (kg/m^2) 41.4 38.8 38.0 37.3 36.4   Fat Mass (lbs) 87.6 81.0 75.2 63.8 68.6   Fat Free Mass (lbs) 124.6 117.8 119.4 127.2 118   Total Body Water (lbs) 89.2 84.0 85.0 90.2 83.4    Preferred Learning Style:   No preference indicated   Learning Readiness:   Ready  24-hr recall: B (AM): greek yogurt and banana or cottage cheese and fruit (15g) Snk (AM):  L (PM): salad with meat (15g) Snk (PM): watermelon D (PM): 3-4 oz fish or beef and vegetable (21 g) Snk (PM): crackers   Fluid intake:  water "all day long", unsweet decaf tea, half decaf coffee, skim milk Estimated total protein intake: over 60 grams per day  Medications: see list  Supplementation: taking: vitamin B12 2500mcg, vitamin d3:2000iu, vitamin A  and d3 5000iu and d3 400iu, ceklebrate multi, caclium, magnesium 250mg    Using straws: No Drinking while eating: tries not to  Hair loss: No Carbonated beverages: No N/V/D/C: No Dumping syndrome: No  Recent physical activity:  Water aerobics 3-4 days a week 60 minutes-90 minutes since mid-February   Progress Towards Goal(s):  In progress.  Handouts given during visit include:  none   Nutritional Diagnosis:  [place]-3.3 Overweight/obesity related to past poor dietary habits and physical inactivity as evidenced by patient w/ recent sleeve gastrectomy surgery following dietary guidelines for continued weight loss.    Intervention:  Nutrition counseling provided.  Teaching Method Utilized:  Visual Auditory Hands on  Barriers to learning/adherence to lifestyle change: none  Demonstrated degree of understanding via:  Teach Back   Monitoring/Evaluation:  Dietary intake, exercise, and body weight. Follow up in 6 months for 10 month post-op visit.

## 2017-02-28 ENCOUNTER — Other Ambulatory Visit: Payer: Self-pay | Admitting: Family Medicine

## 2017-02-28 DIAGNOSIS — Z1231 Encounter for screening mammogram for malignant neoplasm of breast: Secondary | ICD-10-CM

## 2017-03-13 ENCOUNTER — Ambulatory Visit
Admission: RE | Admit: 2017-03-13 | Discharge: 2017-03-13 | Disposition: A | Discharge status: Home / Self Care | Payer: Medicare HMO | Source: Ambulatory Visit | Attending: Family Medicine | Admitting: Family Medicine

## 2017-03-13 DIAGNOSIS — Z1231 Encounter for screening mammogram for malignant neoplasm of breast: Secondary | ICD-10-CM

## 2017-03-23 DIAGNOSIS — H4031X1 Glaucoma secondary to eye trauma, right eye, mild stage: Secondary | ICD-10-CM | POA: Diagnosis not present

## 2017-03-23 DIAGNOSIS — H5440 Blindness, one eye, unspecified eye: Secondary | ICD-10-CM | POA: Diagnosis not present

## 2017-03-23 DIAGNOSIS — H2701 Aphakia, right eye: Secondary | ICD-10-CM | POA: Diagnosis not present

## 2017-03-23 DIAGNOSIS — H4423 Degenerative myopia, bilateral: Secondary | ICD-10-CM | POA: Diagnosis not present

## 2017-03-24 DIAGNOSIS — R7303 Prediabetes: Secondary | ICD-10-CM | POA: Diagnosis not present

## 2017-03-24 DIAGNOSIS — E669 Obesity, unspecified: Secondary | ICD-10-CM | POA: Diagnosis not present

## 2017-03-24 DIAGNOSIS — Z9884 Bariatric surgery status: Secondary | ICD-10-CM | POA: Diagnosis not present

## 2017-03-24 DIAGNOSIS — I10 Essential (primary) hypertension: Secondary | ICD-10-CM | POA: Diagnosis not present

## 2017-03-31 DIAGNOSIS — E038 Other specified hypothyroidism: Secondary | ICD-10-CM | POA: Diagnosis not present

## 2017-03-31 DIAGNOSIS — I10 Essential (primary) hypertension: Secondary | ICD-10-CM | POA: Diagnosis not present

## 2017-03-31 DIAGNOSIS — E782 Mixed hyperlipidemia: Secondary | ICD-10-CM | POA: Diagnosis not present

## 2017-04-04 DIAGNOSIS — Z0001 Encounter for general adult medical examination with abnormal findings: Secondary | ICD-10-CM | POA: Diagnosis not present

## 2017-04-04 DIAGNOSIS — Z6835 Body mass index (BMI) 35.0-35.9, adult: Secondary | ICD-10-CM | POA: Diagnosis not present

## 2017-04-04 DIAGNOSIS — R7301 Impaired fasting glucose: Secondary | ICD-10-CM | POA: Diagnosis not present

## 2017-04-04 DIAGNOSIS — Z23 Encounter for immunization: Secondary | ICD-10-CM | POA: Diagnosis not present

## 2017-04-04 DIAGNOSIS — E782 Mixed hyperlipidemia: Secondary | ICD-10-CM | POA: Diagnosis not present

## 2017-04-04 DIAGNOSIS — I10 Essential (primary) hypertension: Secondary | ICD-10-CM | POA: Diagnosis not present

## 2017-04-04 DIAGNOSIS — E038 Other specified hypothyroidism: Secondary | ICD-10-CM | POA: Diagnosis not present

## 2017-04-04 DIAGNOSIS — E875 Hyperkalemia: Secondary | ICD-10-CM | POA: Diagnosis not present

## 2017-04-19 DIAGNOSIS — E875 Hyperkalemia: Secondary | ICD-10-CM | POA: Diagnosis not present

## 2017-06-07 DIAGNOSIS — Z96641 Presence of right artificial hip joint: Secondary | ICD-10-CM | POA: Diagnosis not present

## 2017-06-07 DIAGNOSIS — G8929 Other chronic pain: Secondary | ICD-10-CM | POA: Diagnosis not present

## 2017-06-07 DIAGNOSIS — M25561 Pain in right knee: Secondary | ICD-10-CM | POA: Diagnosis not present

## 2017-06-07 DIAGNOSIS — M25562 Pain in left knee: Secondary | ICD-10-CM | POA: Diagnosis not present

## 2017-06-07 DIAGNOSIS — Z471 Aftercare following joint replacement surgery: Secondary | ICD-10-CM | POA: Diagnosis not present

## 2017-08-22 DIAGNOSIS — M25562 Pain in left knee: Secondary | ICD-10-CM | POA: Diagnosis not present

## 2017-08-22 DIAGNOSIS — E663 Overweight: Secondary | ICD-10-CM | POA: Diagnosis not present

## 2017-08-22 DIAGNOSIS — Z01818 Encounter for other preprocedural examination: Secondary | ICD-10-CM | POA: Diagnosis not present

## 2017-08-22 DIAGNOSIS — E782 Mixed hyperlipidemia: Secondary | ICD-10-CM | POA: Diagnosis not present

## 2017-08-22 DIAGNOSIS — E038 Other specified hypothyroidism: Secondary | ICD-10-CM | POA: Diagnosis not present

## 2017-08-22 DIAGNOSIS — I10 Essential (primary) hypertension: Secondary | ICD-10-CM | POA: Diagnosis not present

## 2017-08-24 NOTE — Progress Notes (Signed)
Preop on 2/26.  Surgery on 3/5.  Need orders in epic.  Thanks

## 2017-08-28 DIAGNOSIS — I10 Essential (primary) hypertension: Secondary | ICD-10-CM | POA: Diagnosis not present

## 2017-08-28 DIAGNOSIS — E782 Mixed hyperlipidemia: Secondary | ICD-10-CM | POA: Diagnosis not present

## 2017-08-28 NOTE — Patient Instructions (Addendum)
Your procedure is scheduled on: Tuesday, September 05, 2017   Surgery Time:  12:55PM-2:05PM   Report to [facility] Main  Entrance    Report to admitting at 10:25 AM   Call this number if you have problems the morning of surgery [phone]   Do not eat food:After Midnight.   May have clear liquids until 6:30 AM Morning of surgery  CLEAR LIQUID DIET  Foods Allowed                                                                     Foods Excluded  Coffee and tea, regular and decaf                             liquids that you cannot  Plain Jell-O in any flavor                                             see through such as: Fruit ices (not with fruit pulp)                                     milk, soups, orange juice  Iced Popsicles                                    All solid food Carbonated beverages, regular and diet                                    Cranberry, grape and apple juices Sports drinks like Gatorade Lightly seasoned clear broth or consume(fat free) Sugar, honey syrup  Sample Menu Breakfast                                Lunch                                     Supper Cranberry juice                    Beef broth                            Chicken broth Jell-O                                     Grape juice                           Apple juice Coffee or tea  Jell-O                                      Popsicle                                                Coffee or tea                        Coffee or tea   Do NOT smoke after Midnight   Take these medicines the morning of surgery with A SIP OF WATER: Cartia, Levothyroxine                               You may not have any metal on your body including hair pins, jewelry, and body piercings             Do not wear make-up, lotions, powders, perfumes/cologne, or deodorant             Do not wear nail polish.  Do not shave  48 hours prior to surgery.            Do not bring  valuables to the hospital. [facility] IS NOT             RESPONSIBLE   FOR VALUABLES.   Contacts, dentures or bridgework may not be worn into surgery.   Leave suitcase in the car. After surgery it may be brought to your room.   Special Instructions: Bring a copy of your healthcare power of attorney and living will documents         the day of surgery if you haven't scanned them in before.              Please read over the following fact sheets you were given:  [facility] - Preparing for Surgery Before surgery, you can play an important role.  Because skin is not sterile, your skin needs to be as free of germs as possible.  You can reduce the number of germs on your skin by washing with CHG (chlorahexidine gluconate) soap before surgery.  CHG is an antiseptic cleaner which kills germs and bonds with the skin to continue killing germs even after washing. Please DO NOT use if you have an allergy to CHG or antibacterial soaps.  If your skin becomes reddened/irritated stop using the CHG and inform your nurse when you arrive at Short Stay. Do not shave (including legs and underarms) for at least 48 hours prior to the first CHG shower.  You may shave your face/neck.  Please follow these instructions carefully:  1.  Shower with CHG Soap the night before surgery and the  morning of surgery.  2.  If you choose to wash your hair, wash your hair first as usual with your normal  shampoo.  3.  After you shampoo, rinse your hair and body thoroughly to remove the shampoo.                             4.  Use CHG as you would any other liquid soap.  You can apply chg directly to the skin and wash.  Gently with a scrungie or clean washcloth.  5.  Apply the CHG Soap to your body ONLY FROM THE NECK DOWN.   Do   not use on face/ open                           Wound or open sores. Avoid contact with eyes, ears mouth and   genitals (private parts).                       Wash face,  Genitals (private parts) with  your normal soap.             6.  Wash thoroughly, paying special attention to the area where your    surgery  will be performed.  7.  Thoroughly rinse your body with warm water from the neck down.  8.  DO NOT shower/wash with your normal soap after using and rinsing off the CHG Soap.                9.  Pat yourself dry with a clean towel.            10.  Wear clean pajamas.            11.  Place clean sheets on your bed the night of your first shower and do not  sleep with pets. Day of Surgery : Do not apply any lotions/deodorants the morning of surgery.  Please wear clean clothes to the hospital/surgery center.  FAILURE TO FOLLOW THESE INSTRUCTIONS MAY RESULT IN THE CANCELLATION OF YOUR SURGERY  PATIENT SIGNATURE_________________________________  NURSE SIGNATURE__________________________________  ________________________________________________________________________   Rogelia MireIncentive Spirometer  An incentive spirometer is a tool that can help keep your lungs clear and active. This tool measures how well you are filling your lungs with each breath. Taking long deep breaths may help reverse or decrease the chance of developing breathing (pulmonary) problems (especially infection) following:  A long period of time when you are unable to move or be active. BEFORE THE PROCEDURE   If the spirometer includes an indicator to show your best effort, your nurse or respiratory therapist will set it to a desired goal.  If possible, sit up straight or lean slightly forward. Try not to slouch.  Hold the incentive spirometer in an upright position. INSTRUCTIONS FOR USE  1. Sit on the edge of your bed if possible, or sit up as far as you can in bed or on a chair. 2. Hold the incentive spirometer in an upright position. 3. Breathe out normally. 4. Place the mouthpiece in your mouth and seal your lips tightly around it. 5. Breathe in slowly and as deeply as possible, raising the piston or the ball  toward the top of the column. 6. Hold your breath for 3-5 seconds or for as long as possible. Allow the piston or ball to fall to the bottom of the column. 7. Remove the mouthpiece from your mouth and breathe out normally. 8. Rest for a few seconds and repeat Steps 1 through 7 at least 10 times every 1-2 hours when you are awake. Take your time and take a few normal breaths between deep breaths. 9. The spirometer may include an indicator to show your best effort. Use the indicator as a goal to work toward during each repetition. 10. After each set of 10 deep breaths, practice coughing to be sure your lungs are clear. If you have an incision (  the cut made at the time of surgery), support your incision when coughing by placing a pillow or rolled up towels firmly against it. Once you are able to get out of bed, walk around indoors and cough well. You may stop using the incentive spirometer when instructed by your caregiver.  RISKS AND COMPLICATIONS  Take your time so you do not get dizzy or light-headed.  If you are in pain, you may need to take or ask for pain medication before doing incentive spirometry. It is harder to take a deep breath if you are having pain. AFTER USE  Rest and breathe slowly and easily.  It can be helpful to keep track of a log of your progress. Your caregiver can provide you with a simple table to help with this. If you are using the spirometer at home, follow these instructions: SEEK MEDICAL CARE IF:   You are having difficultly using the spirometer.  You have trouble using the spirometer as often as instructed.  Your pain medication is not giving enough relief while using the spirometer.  You develop fever of 100.5 F (38.1 C) or higher. SEEK IMMEDIATE MEDICAL CARE IF:   You cough up bloody sputum that had not been present before.  You develop fever of 102 F (38.9 C) or greater.  You develop worsening pain at or near the incision site. MAKE SURE YOU:    Understand these instructions.  Will watch your condition.  Will get help right away if you are not doing well or get worse. Document Released: 10/31/2006 Document Revised: 09/12/2011 Document Reviewed: 01/01/2007 ExitCare Patient Information 2014 ExitCare, [place].   ________________________________________________________________________  WHAT IS A BLOOD TRANSFUSION? Blood Transfusion Information  A transfusion is the replacement of blood or some of its parts. Blood is made up of multiple cells which provide different functions.  Red blood cells carry oxygen and are used for blood loss replacement.  White blood cells fight against infection.  Platelets control bleeding.  Plasma helps clot blood.  Other blood products are available for specialized needs, such as hemophilia or other clotting disorders. BEFORE THE TRANSFUSION  Who gives blood for transfusions?   Healthy volunteers who are fully evaluated to make sure their blood is safe. This is blood bank blood. Transfusion therapy is the safest it has ever been in the practice of medicine. Before blood is taken from a donor, a complete history is taken to make sure that person has no history of diseases nor engages in risky social behavior (examples are intravenous drug use or sexual activity with multiple partners). The donor's travel history is screened to minimize risk of transmitting infections, such as malaria. The donated blood is tested for signs of infectious diseases, such as HIV and hepatitis. The blood is then tested to be sure it is compatible with you in order to minimize the chance of a transfusion reaction. If you or a relative donates blood, this is often done in anticipation of surgery and is not appropriate for emergency situations. It takes many days to process the donated blood. RISKS AND COMPLICATIONS Although transfusion therapy is very safe and saves many lives, the main dangers of transfusion include:    Getting an infectious disease.  Developing a transfusion reaction. This is an allergic reaction to something in the blood you were given. Every precaution is taken to prevent this. The decision to have a blood transfusion has been considered carefully by your caregiver before blood is given. Blood is not given unless  the benefits outweigh the risks. AFTER THE TRANSFUSION  Right after receiving a blood transfusion, you will usually feel much better and more energetic. This is especially true if your red blood cells have gotten low (anemic). The transfusion raises the level of the red blood cells which carry oxygen, and this usually causes an energy increase.  The nurse administering the transfusion will monitor you carefully for complications. HOME CARE INSTRUCTIONS  No special instructions are needed after a transfusion. You may find your energy is better. Speak with your caregiver about any limitations on activity for underlying diseases you may have. SEEK MEDICAL CARE IF:   Your condition is not improving after your transfusion.  You develop redness or irritation at the intravenous (IV) site. SEEK IMMEDIATE MEDICAL CARE IF:  Any of the following symptoms occur over the next 12 hours:  Shaking chills.  You have a temperature by mouth above 102 F (38.9 C), not controlled by medicine.  Chest, back, or muscle pain.  People around you feel you are not acting correctly or are confused.  Shortness of breath or difficulty breathing.  Dizziness and fainting.  You get a rash or develop hives.  You have a decrease in urine output.  Your urine turns a dark color or changes to pink, red, or brown. Any of the following symptoms occur over the next 10 days:  You have a temperature by mouth above 102 F (38.9 C), not controlled by medicine.  Shortness of breath.  Weakness after normal activity.  The white part of the eye turns yellow (jaundice).  You have a decrease in the  amount of urine or are urinating less often.  Your urine turns a dark color or changes to pink, red, or brown. Document Released: 06/17/2000 Document Revised: 09/12/2011 Document Reviewed: 02/04/2008 [facility] Patient Information 2014 [place], [place].  _______________________________________________________________________

## 2017-08-29 ENCOUNTER — Encounter (HOSPITAL_COMMUNITY)
Admission: RE | Admit: 2017-08-29 | Discharge: 2017-08-29 | Disposition: A | Discharge status: Home / Self Care | Payer: Medicare HMO | Source: Ambulatory Visit | Attending: Orthopedic Surgery | Admitting: Orthopedic Surgery

## 2017-08-29 ENCOUNTER — Other Ambulatory Visit: Payer: Self-pay

## 2017-08-29 ENCOUNTER — Encounter (HOSPITAL_COMMUNITY): Payer: Self-pay

## 2017-08-29 DIAGNOSIS — Z0183 Encounter for blood typing: Secondary | ICD-10-CM | POA: Insufficient documentation

## 2017-08-29 DIAGNOSIS — Z01812 Encounter for preprocedural laboratory examination: Secondary | ICD-10-CM | POA: Insufficient documentation

## 2017-08-29 DIAGNOSIS — M1712 Unilateral primary osteoarthritis, left knee: Secondary | ICD-10-CM | POA: Diagnosis not present

## 2017-08-29 HISTORY — DX: Anemia, unspecified: D64.9

## 2017-08-29 HISTORY — DX: Hypothyroidism, unspecified: E03.9

## 2017-08-29 HISTORY — DX: Personal history of Methicillin resistant Staphylococcus aureus infection: Z86.14

## 2017-08-29 LAB — SURGICAL PCR SCREEN
MRSA, PCR: NEGATIVE
Staphylococcus aureus: NEGATIVE

## 2017-08-29 NOTE — Pre-Procedure Instructions (Signed)
The following are in Ms. Jeffords's hard chart: Surgical clearance, Last office visit note, EKG, CBCwith diff and CMP lab results from Dr. Renea Eeox's office 08/22/17.

## 2017-08-31 NOTE — H&P (Signed)
TOTAL KNEE ADMISSION H&P  Patient is being admitted for left total knee arthroplasty.  Subjective:  Chief Complaint:   Left knee primary OA / pain  HPI: Lauren Clark, 62 y.o. female, has a history of pain and functional disability in the left knee due to arthritis and has failed non-surgical conservative treatments for greater than 12 weeks to include NSAID's and/or analgesics, use of assistive devices and activity modification.  Onset of symptoms was gradual, starting 2+ years ago with gradually worsening course since that time. The patient noted prior procedures on the knee to include  arthroplasty on the right knee(s).  Patient currently rates pain in the left knee(s) at 8 out of 10 with activity. Patient has worsening of pain with activity and weight bearing, pain that interferes with activities of daily living, pain with passive range of motion, crepitus and joint swelling.  Patient has evidence of periarticular osteophytes and joint space narrowing by imaging studies. There is no active infection.  Risks, benefits and expectations were discussed with the patient.  Risks including but not limited to the risk of anesthesia, blood clots, nerve damage, blood vessel damage, failure of the prosthesis, infection and up to and including death.  Patient understand the risks, benefits and expectations and wishes to proceed with surgery.   PCP: Blane Ohara, MD  D/C Plans:       Home  Post-op Meds:       No Rx given  Tranexamic Acid:      To be given - IV   Decadron:      Is to be given  FYI:      ASA  Norco  DME:   Pt already has equipment  PT:   OPPT Rx given   Patient Active Problem List   Diagnosis Date Noted  . S/P right THA, AA 06/14/2016  . Prediabetes 02/29/2016  . Dyslipidemia 02/29/2016  . Degenerative joint disease of right hip 02/29/2016  . S/P laparoscopic sleeve gastrectomy 02/29/2016  . Preoperative cardiovascular examination 02/19/2016  . Hyperlipidemia 02/19/2016  .  Morbid obesity due to excess calories (HCC) 02/19/2016  . Essential hypertension 02/19/2016   Past Medical History:  Diagnosis Date  . Anemia    s/p knee surgery  . Anxiety   . Arthritis   . Depression   . GERD (gastroesophageal reflux disease)   . Glaucoma    right eye uses drops  . [facility] of blood transfusion    [facility]  . History of MRSA infection   . Hyperlipidemia   . Hypertension   . Hypothyroidism   . Impaired vision in both eyes    blind left eye, limited vision right eye  . Morbid obesity (HCC)    s/p Gastric sleeve- 8'17(loss thus far approx. 100 lbs)  . Peripheral vascular disease (HCC)    dvt right leg  . Pre-diabetes    prior to gastric sleeve    Past Surgical History:  Procedure Laterality Date  . ABDOMINAL HYSTERECTOMY  1981   with bilateral BSO  . CATARACT EXTRACTION Right 2010   [facility]  . COLONOSCOPY    . DEBRIDEMENT AND CLOSURE WOUND Right 07/07/2014   Dr. Loralie Champagne  . EYE SURGERY     bil cataraction with IOL as a child, cataract surgery late 1990- right  . JOINT REPLACEMENT Right 2016   knee  . KNEE DEBRIDEMENT Right 06/22/2014   Dr. Linda Hedges with wound vac  . KNEE SURGERY  i&d KNEE POST REPLACEMENT  . LAPAROSCOPIC GASTRIC SLEEVE RESECTION N/A 02/29/2016   Procedure: LAPAROSCOPIC GASTRIC SLEEVE RESECTION, UPPER ENDO;  Surgeon: Gaynelle Adu, MD;  Location: [facility] ORS;  Service: General;  Laterality: N/A;  . TONSILLECTOMY    . TOTAL HIP ARTHROPLASTY Right 06/14/2016   Procedure: RIGHT TOTAL HIP ARTHROPLASTY ANTERIOR APPROACH;  Surgeon: Durene Romans, MD;  Location: [facility] ORS;  Service: Orthopedics;  Laterality: Right;  . UPPER GI ENDOSCOPY  01/25/2016    No current facility-administered medications for this encounter.    Current Outpatient Medications  Medication Sig Dispense Refill Last Dose  . calcium citrate-vitamin D (CELEBRATE CALCIUM CITRATE) 500-500 MG-UNIT chewable tablet Chew 1 tablet by  mouth 3 (three) times daily.     . Cyanocobalamin (VITAMIN B-12) 2500 MCG SUBL Place 5,000 mcg under the tongue daily at 12 noon.     Marland Kitchen DILT-XR 180 MG 24 hr capsule Take 180 mg by mouth daily.   3   . latanoprost (XALATAN) 0.005 % ophthalmic solution Place 1 drop into the right eye at bedtime.    06/13/2016 at Unknown time  . levothyroxine (SYNTHROID, LEVOTHROID) 88 MCG tablet Take 88 mcg by mouth daily before breakfast.    06/14/2016 at 0500  . Magnesium 250 MG TABS Take 500-750 mg by mouth daily. Varies based on leg cramping.   06/07/2016  . Multiple Vitamins-Minerals (CELEBRATE MULTI-COMPLETE 18) CHEW Chew 2 tablets by mouth daily at 12 noon.     . rosuvastatin (CRESTOR) 5 MG tablet Take 5 mg by mouth every evening.    06/13/2016 at Unknown time  . aspirin 325 MG tablet Take 325 mg by mouth every other day.     . telmisartan (MICARDIS) 40 MG tablet Take 40 mg by mouth daily.      Allergies  Allergen Reactions  . Crestor [Rosuvastatin Calcium] Other (See Comments)    Muscle aches    . Lipitor [Atorvastatin] Other (See Comments)    Muscle aches   . Hctz [Hydrochlorothiazide] Other (See Comments)    Pt doesn't remember    Social History   Tobacco Use  . Smoking status: Never Smoker  . Smokeless tobacco: Never Used  Substance Use Topics  . Alcohol use: No    Family History  Problem Relation Age of Onset  . Depression Mother   . Hypertension Father   . Breast cancer Neg Hx      Review of Systems  Constitutional: Negative.   HENT: Negative.   Eyes: Negative.        Blind left eye and limited vision in the right eye  Respiratory: Negative.   Cardiovascular: Negative.   Gastrointestinal: Negative.   Genitourinary: Negative.   Musculoskeletal: Positive for joint pain.  Skin: Negative.   Neurological: Negative.   Endo/Heme/Allergies: Negative.   Psychiatric/Behavioral: Negative.     Objective:  Physical Exam  Constitutional: She is oriented to person, place, and time.  She appears well-developed.  HENT:  Head: Normocephalic.  Eyes: Pupils are equal, round, and reactive to light.  Neck: Neck supple. No JVD present. No tracheal deviation present. No thyromegaly present.  Cardiovascular: Normal rate, regular rhythm and intact distal pulses.  Respiratory: Effort normal and breath sounds normal. No respiratory distress. She has no wheezes.  GI: Soft. There is no tenderness. There is no guarding.  Musculoskeletal:       Left knee: She exhibits decreased range of motion, swelling and bony tenderness. She exhibits no ecchymosis, no deformity, no laceration and no  erythema. Tenderness found.  Lymphadenopathy:    She has no cervical adenopathy.  Neurological: She is alert and oriented to person, place, and time.  Skin: Skin is warm and dry.  Psychiatric: She has a normal mood and affect.     Labs:  Estimated body mass index is 38.32 kg/m as calculated from the following:   Height as of 08/29/17: 5' (1.524 m).   Weight as of 08/29/17: 89 kg (196 lb 3 oz).   Imaging Review Plain radiographs demonstrate severe degenerative joint disease of the left knee(s).  The bone quality appears to be good for age and reported activity level.  Assessment/Plan:  End stage arthritis, left knee   The patient history, physical examination, clinical judgment of the provider and imaging studies are consistent with end stage degenerative joint disease of the left knee(s) and total knee arthroplasty is deemed medically necessary. The treatment options including medical management, injection therapy arthroscopy and arthroplasty were discussed at length. The risks and benefits of total knee arthroplasty were presented and reviewed. The risks due to aseptic loosening, infection, stiffness, patella tracking problems, thromboembolic complications and other imponderables were discussed. The patient acknowledged the explanation, agreed to proceed with the plan and consent was signed.  Patient is being admitted for inpatient treatment for surgery, pain control, PT, OT, prophylactic antibiotics, VTE prophylaxis, progressive ambulation and ADL's and discharge planning. The patient is planning to be discharged home.     Anastasio AuerbachMatthew S. Johan Creveling   PA-C  08/31/2017, 9:44 AM

## 2017-09-04 MED ORDER — TRANEXAMIC ACID 1000 MG/10ML IV SOLN
1000.0000 mg | INTRAVENOUS | Status: AC
  Administered 2017-09-05: 1000 mg via INTRAVENOUS
  Filled 2017-09-04: qty 1100

## 2017-09-05 ENCOUNTER — Other Ambulatory Visit: Payer: Self-pay

## 2017-09-05 ENCOUNTER — Encounter (HOSPITAL_COMMUNITY)
Admission: RE | Disposition: A | Discharge status: Home / Self Care | Payer: Self-pay | Source: Ambulatory Visit | Attending: Orthopedic Surgery

## 2017-09-05 ENCOUNTER — Inpatient Hospital Stay (HOSPITAL_COMMUNITY): Payer: Medicare HMO | Admitting: Anesthesiology

## 2017-09-05 ENCOUNTER — Encounter (HOSPITAL_COMMUNITY): Payer: Self-pay | Admitting: *Deleted

## 2017-09-05 ENCOUNTER — Observation Stay (HOSPITAL_COMMUNITY)
Admission: RE | Admit: 2017-09-05 | Discharge: 2017-09-06 | Disposition: A | Discharge status: Home / Self Care | Payer: Medicare HMO | Source: Ambulatory Visit | Attending: Orthopedic Surgery | Admitting: Orthopedic Surgery

## 2017-09-05 DIAGNOSIS — Z79899 Other long term (current) drug therapy: Secondary | ICD-10-CM | POA: Insufficient documentation

## 2017-09-05 DIAGNOSIS — Z7982 Long term (current) use of aspirin: Secondary | ICD-10-CM | POA: Diagnosis not present

## 2017-09-05 DIAGNOSIS — I10 Essential (primary) hypertension: Secondary | ICD-10-CM | POA: Insufficient documentation

## 2017-09-05 DIAGNOSIS — Z86718 Personal history of other venous thrombosis and embolism: Secondary | ICD-10-CM | POA: Insufficient documentation

## 2017-09-05 DIAGNOSIS — E039 Hypothyroidism, unspecified: Secondary | ICD-10-CM | POA: Diagnosis not present

## 2017-09-05 DIAGNOSIS — F419 Anxiety disorder, unspecified: Secondary | ICD-10-CM | POA: Insufficient documentation

## 2017-09-05 DIAGNOSIS — I739 Peripheral vascular disease, unspecified: Secondary | ICD-10-CM | POA: Diagnosis not present

## 2017-09-05 DIAGNOSIS — H5462 Unqualified visual loss, left eye, normal vision right eye: Secondary | ICD-10-CM | POA: Insufficient documentation

## 2017-09-05 DIAGNOSIS — K219 Gastro-esophageal reflux disease without esophagitis: Secondary | ICD-10-CM | POA: Diagnosis not present

## 2017-09-05 DIAGNOSIS — E785 Hyperlipidemia, unspecified: Secondary | ICD-10-CM | POA: Insufficient documentation

## 2017-09-05 DIAGNOSIS — Z9884 Bariatric surgery status: Secondary | ICD-10-CM | POA: Insufficient documentation

## 2017-09-05 DIAGNOSIS — M1611 Unilateral primary osteoarthritis, right hip: Secondary | ICD-10-CM | POA: Diagnosis not present

## 2017-09-05 DIAGNOSIS — G8918 Other acute postprocedural pain: Secondary | ICD-10-CM | POA: Diagnosis not present

## 2017-09-05 DIAGNOSIS — R7303 Prediabetes: Secondary | ICD-10-CM | POA: Insufficient documentation

## 2017-09-05 DIAGNOSIS — M1712 Unilateral primary osteoarthritis, left knee: Principal | ICD-10-CM | POA: Insufficient documentation

## 2017-09-05 DIAGNOSIS — Z96652 Presence of left artificial knee joint: Secondary | ICD-10-CM

## 2017-09-05 DIAGNOSIS — F329 Major depressive disorder, single episode, unspecified: Secondary | ICD-10-CM | POA: Insufficient documentation

## 2017-09-05 DIAGNOSIS — Z6838 Body mass index (BMI) 38.0-38.9, adult: Secondary | ICD-10-CM | POA: Diagnosis not present

## 2017-09-05 DIAGNOSIS — Z8614 Personal history of Methicillin resistant Staphylococcus aureus infection: Secondary | ICD-10-CM | POA: Diagnosis not present

## 2017-09-05 DIAGNOSIS — Z96659 Presence of unspecified artificial knee joint: Secondary | ICD-10-CM

## 2017-09-05 HISTORY — PX: TOTAL KNEE ARTHROPLASTY: SHX125

## 2017-09-05 LAB — TYPE AND SCREEN
ABO/RH(D): A POS
ANTIBODY SCREEN: NEGATIVE

## 2017-09-05 SURGERY — ARTHROPLASTY, KNEE, TOTAL
Anesthesia: General | Site: Knee | Laterality: Left

## 2017-09-05 MED ORDER — KETOROLAC TROMETHAMINE 30 MG/ML IJ SOLN
INTRAMUSCULAR | Status: AC
  Filled 2017-09-05: qty 1

## 2017-09-05 MED ORDER — DIPHENHYDRAMINE HCL 12.5 MG/5ML PO ELIX: 12.5000 mg | ORAL_SOLUTION | ORAL | Status: DC | PRN

## 2017-09-05 MED ORDER — STERILE WATER FOR IRRIGATION IR SOLN
Status: DC | PRN
  Administered 2017-09-05: 2000 mL

## 2017-09-05 MED ORDER — PHENOL 1.4 % MT LIQD: 1.0000 | OROMUCOSAL | Status: DC | PRN

## 2017-09-05 MED ORDER — BISACODYL 10 MG RE SUPP: 10.0000 mg | Freq: Every day | RECTAL | Status: DC | PRN

## 2017-09-05 MED ORDER — CELECOXIB 200 MG PO CAPS: 200.0000 mg | ORAL_CAPSULE | Freq: Two times a day (BID) | ORAL | 0 refills | Status: DC

## 2017-09-05 MED ORDER — PROPOFOL 500 MG/50ML IV EMUL
INTRAVENOUS | Status: DC | PRN
  Administered 2017-09-05: 100 ug/kg/min via INTRAVENOUS

## 2017-09-05 MED ORDER — GLYCOPYRROLATE 0.2 MG/ML IV SOSY
PREFILLED_SYRINGE | INTRAVENOUS | Status: DC | PRN
  Administered 2017-09-05: 0.4 mg via INTRAVENOUS

## 2017-09-05 MED ORDER — POLYETHYLENE GLYCOL 3350 17 G PO PACK
17.0000 g | PACK | Freq: Two times a day (BID) | ORAL | Status: DC
  Administered 2017-09-05 – 2017-09-06 (×2): 17 g via ORAL
  Filled 2017-09-05 (×2): qty 1

## 2017-09-05 MED ORDER — MORPHINE SULFATE (PF) 2 MG/ML IV SOLN: 0.5000 mg | INTRAVENOUS | Status: DC | PRN

## 2017-09-05 MED ORDER — MIDAZOLAM HCL 2 MG/2ML IJ SOLN
1.0000 mg | INTRAMUSCULAR | Status: DC
  Administered 2017-09-05: 2 mg via INTRAVENOUS
  Filled 2017-09-05: qty 2

## 2017-09-05 MED ORDER — BUPIVACAINE HCL (PF) 0.25 % IJ SOLN
INTRAMUSCULAR | Status: DC | PRN
  Administered 2017-09-05: 25 mL

## 2017-09-05 MED ORDER — BUPIVACAINE-EPINEPHRINE (PF) 0.5% -1:200000 IJ SOLN
INTRAMUSCULAR | Status: DC | PRN
  Administered 2017-09-05: 25 mL via PERINEURAL

## 2017-09-05 MED ORDER — BUPIVACAINE HCL (PF) 0.25 % IJ SOLN
INTRAMUSCULAR | Status: AC
  Filled 2017-09-05: qty 30

## 2017-09-05 MED ORDER — FERROUS SULFATE 325 (65 FE) MG PO TABS: 325.0000 mg | ORAL_TABLET | Freq: Three times a day (TID) | ORAL | 3 refills | Status: DC

## 2017-09-05 MED ORDER — POLYETHYLENE GLYCOL 3350 17 G PO PACK: 17.0000 g | PACK | Freq: Two times a day (BID) | ORAL | 0 refills | Status: DC

## 2017-09-05 MED ORDER — SODIUM CHLORIDE 0.9 % IJ SOLN
INTRAMUSCULAR | Status: AC
  Filled 2017-09-05: qty 50

## 2017-09-05 MED ORDER — DILTIAZEM HCL ER 180 MG PO CP24
180.0000 mg | ORAL_CAPSULE | Freq: Every day | ORAL | Status: DC
  Administered 2017-09-06: 09:00:00 180 mg via ORAL
  Filled 2017-09-05 (×2): qty 1

## 2017-09-05 MED ORDER — METOCLOPRAMIDE HCL 5 MG PO TABS: 5.0000 mg | ORAL_TABLET | Freq: Three times a day (TID) | ORAL | Status: DC | PRN

## 2017-09-05 MED ORDER — ALUM & MAG HYDROXIDE-SIMETH 200-200-20 MG/5ML PO SUSP: 15.0000 mL | ORAL | Status: DC | PRN

## 2017-09-05 MED ORDER — MIDAZOLAM HCL 2 MG/2ML IJ SOLN
INTRAMUSCULAR | Status: AC
  Filled 2017-09-05: qty 2

## 2017-09-05 MED ORDER — METHOCARBAMOL 500 MG PO TABS: 500.0000 mg | ORAL_TABLET | Freq: Four times a day (QID) | ORAL | 0 refills | Status: DC | PRN

## 2017-09-05 MED ORDER — KETOROLAC TROMETHAMINE 30 MG/ML IJ SOLN
INTRAMUSCULAR | Status: DC | PRN
  Administered 2017-09-05: 30 mg

## 2017-09-05 MED ORDER — MENTHOL 3 MG MT LOZG: 1.0000 | LOZENGE | OROMUCOSAL | Status: DC | PRN

## 2017-09-05 MED ORDER — 0.9 % SODIUM CHLORIDE (POUR BTL) OPTIME
TOPICAL | Status: DC | PRN
  Administered 2017-09-05: 1000 mL

## 2017-09-05 MED ORDER — TRANEXAMIC ACID 1000 MG/10ML IV SOLN
1000.0000 mg | Freq: Once | INTRAVENOUS | Status: AC
  Administered 2017-09-05: 1000 mg via INTRAVENOUS
  Filled 2017-09-05: qty 1100

## 2017-09-05 MED ORDER — HYDROCODONE-ACETAMINOPHEN 7.5-325 MG PO TABS: 1.0000 | ORAL_TABLET | ORAL | 0 refills | Status: DC | PRN

## 2017-09-05 MED ORDER — LACTATED RINGERS IV SOLN
INTRAVENOUS | Status: DC
  Administered 2017-09-05 (×2): via INTRAVENOUS

## 2017-09-05 MED ORDER — SODIUM CHLORIDE 0.9 % IV SOLN
INTRAVENOUS | Status: DC
  Administered 2017-09-05: 20:00:00 via INTRAVENOUS

## 2017-09-05 MED ORDER — DEXAMETHASONE SODIUM PHOSPHATE 10 MG/ML IJ SOLN: 10.0000 mg | Freq: Once | INTRAMUSCULAR | Status: DC

## 2017-09-05 MED ORDER — ASPIRIN 81 MG PO CHEW: 81.0000 mg | CHEWABLE_TABLET | Freq: Two times a day (BID) | ORAL | 0 refills | Status: AC

## 2017-09-05 MED ORDER — METHOCARBAMOL 500 MG PO TABS
500.0000 mg | ORAL_TABLET | Freq: Four times a day (QID) | ORAL | Status: DC | PRN
  Administered 2017-09-06 (×2): 500 mg via ORAL
  Filled 2017-09-05 (×2): qty 1

## 2017-09-05 MED ORDER — FENTANYL CITRATE (PF) 100 MCG/2ML IJ SOLN
INTRAMUSCULAR | Status: AC
  Filled 2017-09-05: qty 2

## 2017-09-05 MED ORDER — ONDANSETRON HCL 4 MG/2ML IJ SOLN
INTRAMUSCULAR | Status: AC
  Filled 2017-09-05: qty 2

## 2017-09-05 MED ORDER — CEFAZOLIN SODIUM-DEXTROSE 2-4 GM/100ML-% IV SOLN
2.0000 g | INTRAVENOUS | Status: AC
  Administered 2017-09-05: 2 g via INTRAVENOUS
  Filled 2017-09-05: qty 100

## 2017-09-05 MED ORDER — HYDROCODONE-ACETAMINOPHEN 7.5-325 MG PO TABS
1.0000 | ORAL_TABLET | ORAL | Status: DC | PRN
  Administered 2017-09-05 (×2): 1 via ORAL
  Administered 2017-09-06 (×3): 2 via ORAL
  Filled 2017-09-05 (×2): qty 2
  Filled 2017-09-05 (×2): qty 1
  Filled 2017-09-05: qty 2

## 2017-09-05 MED ORDER — LEVOTHYROXINE SODIUM 88 MCG PO TABS
88.0000 ug | ORAL_TABLET | Freq: Every day | ORAL | Status: DC
  Administered 2017-09-06: 88 ug via ORAL
  Filled 2017-09-05: qty 1

## 2017-09-05 MED ORDER — ONDANSETRON HCL 4 MG PO TABS: 4.0000 mg | ORAL_TABLET | Freq: Four times a day (QID) | ORAL | Status: DC | PRN

## 2017-09-05 MED ORDER — MAGNESIUM CITRATE PO SOLN
1.0000 | Freq: Once | ORAL | Status: DC | PRN
Start: 2017-09-05 — End: 2017-09-06

## 2017-09-05 MED ORDER — PHENYLEPHRINE 40 MCG/ML (10ML) SYRINGE FOR IV PUSH (FOR BLOOD PRESSURE SUPPORT)
PREFILLED_SYRINGE | INTRAVENOUS | Status: DC | PRN
  Administered 2017-09-05: 40 ug via INTRAVENOUS
  Administered 2017-09-05 (×2): 80 ug via INTRAVENOUS

## 2017-09-05 MED ORDER — METHOCARBAMOL 1000 MG/10ML IJ SOLN
500.0000 mg | Freq: Four times a day (QID) | INTRAVENOUS | Status: DC | PRN
  Administered 2017-09-05: 500 mg via INTRAVENOUS
  Filled 2017-09-05: qty 550

## 2017-09-05 MED ORDER — DEXAMETHASONE SODIUM PHOSPHATE 10 MG/ML IJ SOLN
INTRAMUSCULAR | Status: AC
  Filled 2017-09-05: qty 1

## 2017-09-05 MED ORDER — FERROUS SULFATE 325 (65 FE) MG PO TABS
325.0000 mg | ORAL_TABLET | Freq: Three times a day (TID) | ORAL | Status: DC
  Administered 2017-09-06 (×2): 325 mg via ORAL
  Filled 2017-09-05 (×2): qty 1

## 2017-09-05 MED ORDER — CELECOXIB 200 MG PO CAPS
200.0000 mg | ORAL_CAPSULE | Freq: Two times a day (BID) | ORAL | Status: DC
  Administered 2017-09-05 – 2017-09-06 (×2): 200 mg via ORAL
  Filled 2017-09-05 (×2): qty 1

## 2017-09-05 MED ORDER — METOCLOPRAMIDE HCL 5 MG/ML IJ SOLN: 5.0000 mg | Freq: Three times a day (TID) | INTRAMUSCULAR | Status: DC | PRN

## 2017-09-05 MED ORDER — SODIUM CHLORIDE 0.9 % IR SOLN
Status: DC | PRN
  Administered 2017-09-05: 1000 mL

## 2017-09-05 MED ORDER — ROSUVASTATIN CALCIUM 5 MG PO TABS
5.0000 mg | ORAL_TABLET | Freq: Every evening | ORAL | Status: DC
  Administered 2017-09-05: 5 mg via ORAL
  Filled 2017-09-05: qty 1

## 2017-09-05 MED ORDER — ASPIRIN 81 MG PO CHEW
81.0000 mg | CHEWABLE_TABLET | Freq: Two times a day (BID) | ORAL | Status: DC
  Administered 2017-09-05 – 2017-09-06 (×2): 81 mg via ORAL
  Filled 2017-09-05 (×2): qty 1

## 2017-09-05 MED ORDER — SODIUM CHLORIDE 0.9 % IJ SOLN
INTRAMUSCULAR | Status: DC | PRN
  Administered 2017-09-05: 29 mL

## 2017-09-05 MED ORDER — EPHEDRINE SULFATE-NACL 50-0.9 MG/10ML-% IV SOSY
PREFILLED_SYRINGE | INTRAVENOUS | Status: DC | PRN
  Administered 2017-09-05 (×5): 10 mg via INTRAVENOUS

## 2017-09-05 MED ORDER — CEFAZOLIN SODIUM-DEXTROSE 2-4 GM/100ML-% IV SOLN
2.0000 g | Freq: Four times a day (QID) | INTRAVENOUS | Status: AC
  Administered 2017-09-05 – 2017-09-06 (×2): 2 g via INTRAVENOUS
  Filled 2017-09-05 (×2): qty 100

## 2017-09-05 MED ORDER — DEXAMETHASONE SODIUM PHOSPHATE 10 MG/ML IJ SOLN
10.0000 mg | Freq: Once | INTRAMUSCULAR | Status: AC
  Administered 2017-09-06: 09:00:00 10 mg via INTRAVENOUS
  Filled 2017-09-05: qty 1

## 2017-09-05 MED ORDER — FENTANYL CITRATE (PF) 100 MCG/2ML IJ SOLN
50.0000 ug | INTRAMUSCULAR | Status: DC
  Administered 2017-09-05: 50 ug via INTRAVENOUS
  Filled 2017-09-05: qty 2

## 2017-09-05 MED ORDER — GLYCOPYRROLATE 0.2 MG/ML IV SOSY
PREFILLED_SYRINGE | INTRAVENOUS | Status: AC
  Filled 2017-09-05: qty 5

## 2017-09-05 MED ORDER — DOCUSATE SODIUM 100 MG PO CAPS
100.0000 mg | ORAL_CAPSULE | Freq: Two times a day (BID) | ORAL | Status: DC
  Administered 2017-09-05 – 2017-09-06 (×2): 100 mg via ORAL
  Filled 2017-09-05 (×2): qty 1

## 2017-09-05 MED ORDER — ONDANSETRON HCL 4 MG/2ML IJ SOLN: 4.0000 mg | Freq: Four times a day (QID) | INTRAMUSCULAR | Status: DC | PRN

## 2017-09-05 MED ORDER — PROPOFOL 10 MG/ML IV BOLUS
INTRAVENOUS | Status: AC
  Filled 2017-09-05: qty 20

## 2017-09-05 MED ORDER — ROCURONIUM BROMIDE 10 MG/ML (PF) SYRINGE
PREFILLED_SYRINGE | INTRAVENOUS | Status: AC
  Filled 2017-09-05: qty 5

## 2017-09-05 MED ORDER — LATANOPROST 0.005 % OP SOLN
1.0000 [drp] | Freq: Every day | OPHTHALMIC | Status: DC
  Administered 2017-09-05: 1 [drp] via OPHTHALMIC
  Filled 2017-09-05: qty 2.5

## 2017-09-05 MED ORDER — PROPOFOL 10 MG/ML IV BOLUS
INTRAVENOUS | Status: AC
  Filled 2017-09-05: qty 40

## 2017-09-05 MED ORDER — PHENYLEPHRINE 40 MCG/ML (10ML) SYRINGE FOR IV PUSH (FOR BLOOD PRESSURE SUPPORT)
PREFILLED_SYRINGE | INTRAVENOUS | Status: AC
  Filled 2017-09-05: qty 10

## 2017-09-05 MED ORDER — IRBESARTAN 150 MG PO TABS
150.0000 mg | ORAL_TABLET | Freq: Every day | ORAL | Status: DC
  Administered 2017-09-06: 09:00:00 150 mg via ORAL
  Filled 2017-09-05: qty 1

## 2017-09-05 MED ORDER — MIDAZOLAM HCL 5 MG/5ML IJ SOLN
INTRAMUSCULAR | Status: DC | PRN
  Administered 2017-09-05 (×2): 1 mg via INTRAVENOUS

## 2017-09-05 MED ORDER — DOCUSATE SODIUM 100 MG PO CAPS: 100.0000 mg | ORAL_CAPSULE | Freq: Two times a day (BID) | ORAL | 0 refills | Status: DC

## 2017-09-05 MED ORDER — LIDOCAINE 2% (20 MG/ML) 5 ML SYRINGE
INTRAMUSCULAR | Status: AC
  Filled 2017-09-05: qty 5

## 2017-09-05 MED ORDER — CHLORHEXIDINE GLUCONATE 4 % EX LIQD: 60.0000 mL | Freq: Once | CUTANEOUS | Status: DC

## 2017-09-05 MED ORDER — BUPIVACAINE IN DEXTROSE 0.75-8.25 % IT SOLN
INTRATHECAL | Status: DC | PRN
  Administered 2017-09-05: 1.5 mL via INTRATHECAL

## 2017-09-05 SURGICAL SUPPLY — 51 items
ADH SKN CLS APL DERMABOND .7 (GAUZE/BANDAGES/DRESSINGS) ×1
BAG SPEC THK2 15X12 ZIP CLS (MISCELLANEOUS) ×1
BAG ZIPLOCK 12X15 (MISCELLANEOUS) ×2 IMPLANT
BANDAGE ACE 6X5 VEL STRL LF (GAUZE/BANDAGES/DRESSINGS) ×3 IMPLANT
BLADE SAW SGTL 11.0X1.19X90.0M (BLADE) ×2 IMPLANT
BLADE SAW SGTL 13.0X1.19X90.0M (BLADE) ×3 IMPLANT
BOWL SMART MIX CTS (DISPOSABLE) ×3 IMPLANT
CAPT KNEE TOTAL 3 ATTUNE ×2 IMPLANT
CEMENT HV SMART SET (Cement) ×4 IMPLANT
COVER SURGICAL LIGHT HANDLE (MISCELLANEOUS) ×3 IMPLANT
CUFF TOURN SGL QUICK 34 (TOURNIQUET CUFF) ×3
CUFF TRNQT CYL 34X4X40X1 (TOURNIQUET CUFF) ×1 IMPLANT
DECANTER SPIKE VIAL GLASS SM (MISCELLANEOUS) ×3 IMPLANT
DERMABOND ADVANCED (GAUZE/BANDAGES/DRESSINGS) ×2
DERMABOND ADVANCED .7 DNX12 (GAUZE/BANDAGES/DRESSINGS) ×1 IMPLANT
DRAPE TOP 10253 STERILE (DRAPES) ×2 IMPLANT
DRAPE U-SHAPE 47X51 STRL (DRAPES) ×3 IMPLANT
DRESSING AQUACEL AG SP 3.5X10 (GAUZE/BANDAGES/DRESSINGS) ×1 IMPLANT
DRSG AQUACEL AG SP 3.5X10 (GAUZE/BANDAGES/DRESSINGS) ×3
DURAPREP 26ML APPLICATOR (WOUND CARE) ×6 IMPLANT
ELECT REM PT RETURN 15FT ADLT (MISCELLANEOUS) ×3 IMPLANT
GLOVE BIOGEL PI IND STRL 6.5 (GLOVE) IMPLANT
GLOVE BIOGEL PI IND STRL 7.5 (GLOVE) ×1 IMPLANT
GLOVE BIOGEL PI IND STRL 8.5 (GLOVE) ×1 IMPLANT
GLOVE BIOGEL PI IND STRL 9 (GLOVE) IMPLANT
GLOVE BIOGEL PI INDICATOR 6.5 (GLOVE) ×2
GLOVE BIOGEL PI INDICATOR 7.5 (GLOVE) ×12
GLOVE BIOGEL PI INDICATOR 8.5 (GLOVE) ×2
GLOVE BIOGEL PI INDICATOR 9 (GLOVE) ×2
GLOVE ECLIPSE 8.0 STRL XLNG CF (GLOVE) ×5 IMPLANT
GLOVE ORTHO TXT STRL SZ7.5 (GLOVE) ×6 IMPLANT
GLOVE SURG SS PI 6.5 STRL IVOR (GLOVE) ×2 IMPLANT
GLOVE SURG SS PI 7.5 STRL IVOR (GLOVE) ×2 IMPLANT
GOWN SPEC L3 XXLG W/TWL (GOWN DISPOSABLE) ×2 IMPLANT
GOWN STRL REUS W/TWL LRG LVL3 (GOWN DISPOSABLE) ×5 IMPLANT
GOWN STRL REUS W/TWL XL LVL3 (GOWN DISPOSABLE) ×5 IMPLANT
HANDPIECE INTERPULSE COAX TIP (DISPOSABLE) ×3
MANIFOLD NEPTUNE II (INSTRUMENTS) ×3 IMPLANT
PACK TOTAL KNEE CUSTOM (KITS) ×3 IMPLANT
POSITIONER SURGICAL ARM (MISCELLANEOUS) ×3 IMPLANT
SET HNDPC FAN SPRY TIP SCT (DISPOSABLE) ×1 IMPLANT
SET PAD KNEE POSITIONER (MISCELLANEOUS) ×3 IMPLANT
SUT MNCRL AB 4-0 PS2 18 (SUTURE) ×3 IMPLANT
SUT STRATAFIX PDS+ 0 24IN (SUTURE) ×6 IMPLANT
SUT VIC AB 1 CT1 36 (SUTURE) ×3 IMPLANT
SUT VIC AB 2-0 CT1 27 (SUTURE) ×9
SUT VIC AB 2-0 CT1 TAPERPNT 27 (SUTURE) ×3 IMPLANT
SYR 50ML LL SCALE MARK (SYRINGE) ×3 IMPLANT
TRAY FOLEY CATH SILVER 14FR (SET/KITS/TRAYS/PACK) ×2 IMPLANT
WRAP KNEE MAXI GEL POST OP (GAUZE/BANDAGES/DRESSINGS) ×3 IMPLANT
YANKAUER SUCT BULB TIP 10FT TU (MISCELLANEOUS) ×3 IMPLANT

## 2017-09-05 NOTE — Anesthesia Postprocedure Evaluation (Signed)
Anesthesia Post Note  Patient: Lauren Clark  Procedure(s) Performed: LEFT TOTAL KNEE ARTHROPLASTY (Left Knee)     Patient location during evaluation: PACU Anesthesia Type: Spinal Level of consciousness: awake Pain management: satisfactory to patient Vital Signs Assessment: post-procedure vital signs reviewed and stable Respiratory status: spontaneous breathing Cardiovascular status: blood pressure returned to baseline Postop Assessment: no headache and spinal receding Anesthetic complications: no    Last Vitals:  Vitals:   09/05/17 1600 09/05/17 1615  BP: 123/66 137/79  Pulse: 61 (!) 58  Resp: (!) 21 17  Temp:    SpO2: 98% 99%    Last Pain:  Vitals:   09/05/17 1430  TempSrc:   PainSc: 0-No pain                 Adeline Petitfrere EDWARD

## 2017-09-05 NOTE — Progress Notes (Signed)
Pt very upset over delay in getting to room and not having eaten since last night.  Pt informed rooms are being cleaned, however I understand her frustration.  RN called service response and reordered meal.

## 2017-09-05 NOTE — Op Note (Signed)
NAME:  Lauren Clark                      MEDICAL RECORD NO.:  [id number]                             FACILITY:  [facility]      PHYSICIAN:  Madlyn Frankel. Charlann Boxer, M.D.  DATE OF BIRTH:  Oct 13, 1955      DATE OF PROCEDURE:  09/05/2017                                     OPERATIVE REPORT         PREOPERATIVE DIAGNOSIS:  Left knee osteoarthritis.      POSTOPERATIVE DIAGNOSIS:  Left knee osteoarthritis.      FINDINGS:  The patient was noted to have complete loss of cartilage and   bone-on-bone arthritis with associated osteophytes in the medial and patellofemoral compartments of   the knee with a significant synovitis and associated effusion.      PROCEDURE:  Left total knee replacement.      COMPONENTS USED:  DePuy Attune rotating platform posterior stabilized knee   system, a size 4 femur, 4 tibia, size 10 mm PS AOX insert, and 35 anatomic patellar   button.      SURGEON:  Madlyn Frankel. Charlann Boxer, M.D.      ASSISTANT:  Lanney Gins, PA-C.      ANESTHESIA:  Regional and Spinal.      SPECIMENS:  None.      COMPLICATION:  None.      DRAINS:  None.  EBL: <100cc      TOURNIQUET TIME:   Total Tourniquet Time Documented: Thigh (Left) - 29 minutes Total: Thigh (Left) - 29 minutes  .      The patient was stable to the recovery room.      INDICATION FOR PROCEDURE:  Lauren Clark is a 62 y.o. female patient of   mine.  The patient had been seen, evaluated, and treated conservatively in the   office with medication, activity modification, and injections.  The patient had   radiographic changes of bone-on-bone arthritis with endplate sclerosis and osteophytes noted.      The patient failed conservative measures including medication, injections, and activity modification, and at this point was ready for more definitive measures.   Based on the radiographic changes and failed conservative measures, the patient   decided to proceed with total knee replacement.  Risks of infection,   DVT, component  failure, need for revision surgery, postop course, and   expectations were all   discussed and reviewed.  Consent was obtained for benefit of pain   relief.      PROCEDURE IN DETAIL:  The patient was brought to the operative theater.   Once adequate anesthesia, preoperative antibiotics, 2 gm of Ancef, 1 gm of Tranexamic Acid, and 10 mg of Decadron administered, the patient was positioned supine with the left thigh tourniquet placed.  The  left lower extremity was prepped and draped in sterile fashion.  A time-   out was performed identifying the patient, planned procedure, and   extremity.      The left lower extremity was placed in the [facility] leg holder.  The leg was   exsanguinated, tourniquet elevated to 250 mmHg.  A midline incision was  made followed by median parapatellar arthrotomy.  Following initial   exposure, attention was first directed to the patella.  Precut   measurement was noted to be 22-23 mm with significant osteophytes cirmcufrentially.  I resected down to 13-14 mm and used a   35 anatomic patellar button to restore patellar height as well as cover the cut   surface.      The lug holes were drilled and a metal shim was placed to protect the   patella from retractors and saw blades.      At this point, attention was now directed to the femur.  The femoral   canal was opened with a drill, irrigated to try to prevent fat emboli.  An   intramedullary rod was passed at 3 degrees valgus, 9 mm of bone was   resected off the distal femur.  Following this resection, the tibia was   subluxated anteriorly.  Using the extramedullary guide, 2 mm of bone was resected off   the proximal medial tibia.  We confirmed the gap would be   stable medially and laterally with a size 6 spacer block as well as confirmed   the cut was perpendicular in the coronal plane, checking with an alignment rod.      Once this was done, I sized the femur to be a size 4 in the anterior-   posterior  dimension, chose a standard component based on medial and   lateral dimension.  The size 4 rotation block was then pinned in   position anterior referenced using the C-clamp to set rotation.  The   anterior, posterior, and  chamfer cuts were made without difficulty nor   notching making certain that I was along the anterior cortex to help   with flexion gap stability.      The final box cut was made off the lateral aspect of distal femur.      At this point, the tibia was sized to be a size 4, the size 4 tray was   then pinned in position through the medial third of the tubercle,   drilled, and keel punched.  Trial reduction was now carried with a 4 femur,  4 tibia, a size 6 then up to the 10 mm insert despite the minimal bone resected, and the 35 anatomic patella botton.  The knee was brought to   extension, full extension with good flexion stability with the patella   tracking through the trochlea without application of pressure.  Given   all these findings the femoral lug holes were drilled and then the trial components removed.  Final components were   opened and cement was mixed.  The knee was irrigated with normal saline   solution and pulse lavage.  The synovial lining was   then injected with 30 cc of 0.25% Marcaine with epinephrine and 1 cc of Toradol plus 30 cc of NS for a total of 61 cc.      The knee was irrigated.  Final implants were then cemented onto clean and   dried cut surfaces of bone with the knee brought to extension with a size 10 mm PS trial insert.      Once the cement had fully cured, the excess cement was removed   throughout the knee.  I confirmed I was satisfied with the range of   motion and stability, and the final size 10 mm PS AOX insert was chosen.  It was   placed into the  knee.      The tourniquet had been let down at 29 minutes.  No significant   hemostasis required.  The   extensor mechanism was then reapproximated using #1 Vicryl with the knee    in flexion.  The   remaining wound was closed with 2-0 Vicryl and running 4-0 Monocryl.   The knee was cleaned, dried, dressed sterilely using Dermabond and   Aquacel dressing.  The patient was then   brought to recovery room in stable condition, tolerating the procedure   well.   Please note that Physician Assistant, Lanney Gins, PA-C, was present for the entirety of the case, and was utilized for pre-operative positioning, peri-operative retractor management, general facilitation of the procedure.  He was also utilized for primary wound closure at the end of the case.              Madlyn Frankel Charlann Boxer, M.D.    09/05/2017 1:35 PM

## 2017-09-05 NOTE — Anesthesia Preprocedure Evaluation (Signed)
Anesthesia Evaluation  Patient identified by MRN, date of birth, ID band Patient awake    Reviewed: Allergy & Precautions, H&P , Patient's Chart, lab work & pertinent test results, reviewed documented beta blocker date and time   Airway Mallampati: II  TM Distance: >3 FB Neck ROM: full    Dental no notable dental hx.    Pulmonary    Pulmonary exam normal breath sounds clear to auscultation       Cardiovascular hypertension,  Rhythm:regular Rate:Normal     Neuro/Psych    GI/Hepatic   Endo/Other    Renal/GU      Musculoskeletal   Abdominal   Peds  Hematology   Anesthesia Other Findings   Reproductive/Obstetrics                             Anesthesia Physical Anesthesia Plan  ASA: III  Anesthesia Plan: General   Post-op Pain Management:    Induction: Intravenous  PONV Risk Score and Plan: 3 and Treatment may vary due to age or medical condition, Dexamethasone and Ondansetron  Airway Management Planned: Oral ETT  Additional Equipment:   Intra-op Plan:   Post-operative Plan: Extubation in OR  Informed Consent: I have reviewed the patients History and Physical, chart, labs and discussed the procedure including the risks, benefits and alternatives for the proposed anesthesia with the patient or authorized representative who has indicated his/her understanding and acceptance.   Dental Advisory Given  Plan Discussed with: CRNA and Surgeon  Anesthesia Plan Comments: (  )        Anesthesia Quick Evaluation

## 2017-09-05 NOTE — Progress Notes (Signed)
Pt received to room 1601 via stretcher and transferred to bed. Oriented to call bell for safety, care plan and education initiated, [facility].

## 2017-09-05 NOTE — Transfer of Care (Signed)
Immediate Anesthesia Transfer of Care Note  Patient: Lauren Clark  Procedure(s) Performed: LEFT TOTAL KNEE ARTHROPLASTY (Left Knee)  Patient Location: PACU  Anesthesia Type:Regional and Spinal  Level of Consciousness: sedated  Airway & Oxygen Therapy: Patient Spontanous Breathing and Patient connected to face mask oxygen  Post-op Assessment: Report given to RN and Post -op Vital signs reviewed and stable  Post vital signs: Reviewed and stable  Last Vitals:  Vitals:   09/05/17 1157 09/05/17 1158  BP:    Pulse: 60 (!) 58  Resp: 20 (!) 26  Temp:    SpO2: 97% 99%    Last Pain:  Vitals:   09/05/17 1053  TempSrc:   PainSc: 5       Patients Stated Pain Goal: 4 (09/05/17 1053)  Complications: No apparent anesthesia complications

## 2017-09-05 NOTE — Discharge Instructions (Signed)

## 2017-09-05 NOTE — Progress Notes (Signed)
Pt transported to room with 1 belongings bag, 1 pink bag, glasses on and phone in hand.

## 2017-09-05 NOTE — Progress Notes (Signed)
AssistedDr. Kyle Jackson with left, ultrasound guided, adductor canal block. Side rails up, monitors on throughout procedure. See vital signs in flow sheet. Tolerated Procedure well.  

## 2017-09-05 NOTE — Anesthesia Procedure Notes (Signed)
Anesthesia Regional Block: Adductor canal block   Pre-Anesthetic Checklist: ,, timeout performed, Correct Patient, Correct Site, Correct Laterality, Correct Procedure, Correct Position, site marked, Risks and benefits discussed, pre-op evaluation,  At surgeon's request and post-op pain management  Laterality: Left  Prep: chloraprep       Needles:   Needle Type: Echogenic Needle     Needle Length: 9cm  Needle Gauge: 21     Additional Needles:   Procedures:,,,, ultrasound used (permanent image in chart),,,,  Narrative:  Start time: 09/05/2017 11:50 AM End time: 09/05/2017 11:55 AM Injection made incrementally with aspirations every 5 mL. Anesthesiologist: Cristela BlueJackson, Faatimah Spielberg, MD

## 2017-09-05 NOTE — Anesthesia Procedure Notes (Addendum)
Spinal  Patient location during procedure: OR Start time: 09/05/2017 12:15 PM End time: 09/05/2017 12:18 PM Staffing Resident/CRNA: Lind Covert, CRNA Performed: resident/CRNA  Preanesthetic Checklist Completed: patient identified, site marked, surgical consent, pre-op evaluation, timeout performed, IV checked, risks and benefits discussed and monitors and equipment checked Spinal Block Patient position: sitting Prep: Betadine Patient monitoring: heart rate, cardiac monitor, continuous pulse ox and blood pressure Approach: midline Location: L3-4 Injection technique: single-shot Needle Needle type: Pencan  Needle gauge: 24 G Needle length: 10 cm Needle insertion depth: 8 cm Assessment Sensory level: T6 Additional Notes Timeout performed. SAB kit date checked. SAB without difficulty.

## 2017-09-05 NOTE — Interval H&P Note (Signed)
History and Physical Interval Note:  09/05/2017 11:06 AM  Lauren Clark  has presented today for surgery, with the diagnosis of Left knee osteoarthitis  The various methods of treatment have been discussed with the patient and family. After consideration of risks, benefits and other options for treatment, the patient has consented to  Procedure(s) with comments: LEFT TOTAL KNEE ARTHROPLASTY (Left) - 70 mins as a surgical intervention .  The patient's history has been reviewed, patient examined, no change in status, stable for surgery.  I have reviewed the patient's chart and labs.  Questions were answered to the patient's satisfaction.     Shelda PalMatthew D Aysha Livecchi

## 2017-09-05 NOTE — Progress Notes (Signed)
Pt remains frustrated and pt called pt experience number,  Number only answered from 8-5 PM.  AC contacted and came to talk to pt., NT re-contacted dietary.  6th floor updated RN that no bed will be available before 7PM.

## 2017-09-06 ENCOUNTER — Encounter (HOSPITAL_COMMUNITY): Payer: Self-pay | Admitting: Orthopedic Surgery

## 2017-09-06 DIAGNOSIS — R7303 Prediabetes: Secondary | ICD-10-CM | POA: Diagnosis not present

## 2017-09-06 DIAGNOSIS — F419 Anxiety disorder, unspecified: Secondary | ICD-10-CM | POA: Diagnosis not present

## 2017-09-06 DIAGNOSIS — I739 Peripheral vascular disease, unspecified: Secondary | ICD-10-CM | POA: Diagnosis not present

## 2017-09-06 DIAGNOSIS — E785 Hyperlipidemia, unspecified: Secondary | ICD-10-CM | POA: Diagnosis not present

## 2017-09-06 DIAGNOSIS — I10 Essential (primary) hypertension: Secondary | ICD-10-CM | POA: Diagnosis not present

## 2017-09-06 DIAGNOSIS — K219 Gastro-esophageal reflux disease without esophagitis: Secondary | ICD-10-CM | POA: Diagnosis not present

## 2017-09-06 DIAGNOSIS — F329 Major depressive disorder, single episode, unspecified: Secondary | ICD-10-CM | POA: Diagnosis not present

## 2017-09-06 DIAGNOSIS — M1712 Unilateral primary osteoarthritis, left knee: Secondary | ICD-10-CM | POA: Diagnosis not present

## 2017-09-06 DIAGNOSIS — E039 Hypothyroidism, unspecified: Secondary | ICD-10-CM | POA: Diagnosis not present

## 2017-09-06 LAB — BASIC METABOLIC PANEL
Anion gap: 9 (ref 5–15)
BUN: 20 mg/dL (ref 6–20)
CHLORIDE: 103 mmol/L (ref 101–111)
CO2: 26 mmol/L (ref 22–32)
Calcium: 9 mg/dL (ref 8.9–10.3)
Creatinine, Ser: 0.99 mg/dL (ref 0.44–1.00)
GFR calc Af Amer: >60 mL/min (ref >60)
GFR, EST NON AFRICAN AMERICAN: 60 mL/min — AB (ref >60)
Glucose, Bld: 181 mg/dL — ABNORMAL HIGH (ref 65–99)
Potassium: 4.6 mmol/L (ref 3.5–5.1)
Sodium: 138 mmol/L (ref 135–145)

## 2017-09-06 LAB — CBC
HEMATOCRIT: 34.4 % — AB (ref 36.0–46.0)
HEMOGLOBIN: 10.9 g/dL — AB (ref 12.0–15.0)
MCH: 28.3 pg (ref 26.0–34.0)
MCHC: 31.7 g/dL (ref 30.0–36.0)
MCV: 89.4 fL (ref 78.0–100.0)
Platelets: 223 10*3/uL (ref 150–400)
RBC: 3.85 MIL/uL — ABNORMAL LOW (ref 3.87–5.11)
RDW: 14.4 % (ref 11.5–15.5)
WBC: 18.7 10*3/uL — ABNORMAL HIGH (ref 4.0–10.5)

## 2017-09-06 NOTE — Progress Notes (Signed)
Physical Therapy Treatment Patient Details Name: Lauren Clark MRN: [id number] DOB: 07/30/1955 Today's Date: 09/06/2017    History of Present Illness Pt s/p L TKR and with hx of R THR and R TKR    PT Comments    Pt motivated, progressing well with mobility and eager for dc home this date.  Reviewed home therex with HHPT scheduled for follow up.   Follow Up Recommendations  Home health PT     Equipment Recommendations  None recommended by PT    Recommendations for Other Services       Precautions / Restrictions Precautions Precautions: Knee;Fall Restrictions Weight Bearing Restrictions: No Other Position/Activity Restrictions: WBAT    Mobility  Bed Mobility Overal bed mobility: Needs Assistance Bed Mobility: Sit to Supine     Supine to sit: Supervision Sit to supine: Supervision   General bed mobility comments: min cues for sequence and use of R LE to self assist  Transfers Overall transfer level: Needs assistance Equipment used: Rolling walker (2 wheeled) Transfers: Sit to/from Stand Sit to Stand: Supervision         General transfer comment: cues for LE management and use of UEs to self assist  Ambulation/Gait Ambulation/Gait assistance: Min guard;Supervision Ambulation Distance (Feet): 200 Feet Assistive device: Rolling walker (2 wheeled) Gait Pattern/deviations: Shuffle;Step-to pattern;Step-through pattern;Decreased step length - right;Decreased step length - left;Trunk flexed Gait velocity: decr Gait velocity interpretation: Below normal speed for age/gender General Gait Details: cues for sequence, posture and position from [employer]            Wheelchair Mobility    Modified Rankin (Stroke Patients Only)       Balance Overall balance assessment: No apparent balance deficits (not formally assessed)                                          Cognition Arousal/Alertness: Awake/alert Behavior During Therapy: WFL for  tasks assessed/performed Overall Cognitive Status: Within Functional Limits for tasks assessed                                        Exercises Total Joint Exercises Ankle Circles/Pumps: AROM;Both;Supine;20 reps Quad Sets: AROM;Both;10 reps;Supine Heel Slides: AAROM;Left;15 reps;Supine Straight Leg Raises: AAROM;AROM;Left;15 reps;Supine    General Comments        Pertinent Vitals/Pain Pain Assessment: 0-10 Pain Score: 4  Pain Location: L knee Pain Descriptors / Indicators: Aching;Sore Pain Intervention(s): Limited activity within patient's tolerance;Monitored during session;Premedicated before session;Ice applied    Home Living Family/patient expects to be discharged to:: Private residence Living Arrangements: Alone Available Help at Discharge: Available 24 hours/day;Family;Friend(s) Type of Home: Apartment Home Access: Level entry   Home Layout: One level Home Equipment: Walker - 2 wheels;Walker - 4 wheels;Bedside commode;Cane - single point      Prior Function Level of Independence: Independent;Independent with assistive device(s)      Comments: Used cane as needed   PT Goals (current goals can now be found in the care plan section) Acute Rehab PT Goals Patient Stated Goal: Regain IND and not need anymore joints replaced PT Goal Formulation: With patient Time For Goal Achievement: 09/09/17 Potential to Achieve Goals: Good Progress towards PT goals: Progressing toward goals    Frequency    7X/week  PT Plan Current plan remains appropriate    Co-evaluation              AM-PAC PT "6 Clicks" Daily Activity  Outcome Measure  Difficulty turning over in bed (including adjusting bedclothes, sheets and blankets)?: A Little Difficulty moving from lying on back to sitting on the side of the bed? : A Little Difficulty sitting down on and standing up from a chair with arms (e.g., wheelchair, bedside commode, etc,.)?: A Little Help needed  moving to and from a bed to chair (including a wheelchair)?: A Little Help needed walking in hospital room?: A Little Help needed climbing 3-5 steps with a railing? : A Little 6 Click Score: 18    End of Session Equipment Utilized During Treatment: Gait belt Activity Tolerance: Patient tolerated treatment well Patient left: in bed;with call bell/phone within reach Nurse Communication: Mobility status PT Visit Diagnosis: Difficulty in walking, not elsewhere classified (R26.2)     Time: [id number] PT Time Calculation (min) (ACUTE ONLY): 26 min  Charges:  $Gait Training: 8-22 mins $Therapeutic Exercise: 8-22 mins                    G Codes:       Pg [phone]    Rayanne Padmanabhan 09/06/2017, 12:49 PM

## 2017-09-06 NOTE — Progress Notes (Signed)
     Subjective: 1 Day Post-Op Procedure(s) (LRB): LEFT TOTAL KNEE ARTHROPLASTY (Left)   Seen by Dr. Charlann Boxerlin. Patient reports pain as mild, pain controlled. No events throughout the night. Ready to get this knee better like her hip.  Ready to be discharged home if she does well with PT.   Objective:   VITALS:   Vitals:   09/06/17 0212 09/06/17 0619  BP: 118/73 131/73  Pulse: 62 64  Resp: 16 16  Temp: 98.4 F (36.9 C)   SpO2: 99% 98%    Dorsiflexion/Plantar flexion intact Incision: dressing C/D/I No cellulitis present Compartment soft  LABS Recent Labs    09/06/17 0543  HGB 10.9*  HCT 34.4*  WBC 18.7*  PLT 223    Recent Labs    09/06/17 0543  NA 138  K 4.6  BUN 20  CREATININE 0.99  GLUCOSE 181*     Assessment/Plan: 1 Day Post-Op Procedure(s) (LRB): LEFT TOTAL KNEE ARTHROPLASTY (Left) Foley cath d/c'ed Advance diet Up with therapy D/C IV fluids Discharge home [facility] Follow up with OLIN,Dameion Briles D in 2 weeks.  Contact information:  [facility] [address] [place] [id number] [phone]    Obese (BMI 30-39.9) Estimated body mass index is 38.28 kg/m as calculated from the following:   Height as of this encounter: 5' (1.524 m).   Weight as of this encounter: 88.9 kg (196 lb). Patient also counseled that weight may inhibit the healing process Patient counseled that losing weight will help with future health issues         Anastasio AuerbachMatthew S. Miguel Christiana   PAC  09/06/2017, 7:41 AM

## 2017-09-06 NOTE — Discharge Summary (Signed)
Physician Discharge Summary  Patient ID: Lauren Clark MRN: [id number] DOB/AGE: 63-07-1955 63 y.o.  Admit date: 09/05/2017 Discharge date:  09/06/2017  Procedures:  Procedure(s) (LRB): LEFT TOTAL KNEE ARTHROPLASTY (Left)  Attending Physician:  Dr. Durene RomansMatthew Olin   Admission Diagnoses:   Left knee primary OA / pain  Discharge Diagnoses:  Principal Problem:   S/P left TKA Active Problems:   S/P total knee replacement  Past Medical History:  Diagnosis Date  . Anemia    s/p knee surgery  . Anxiety   . Arthritis   . Depression   . GERD (gastroesophageal reflux disease)   . Glaucoma    right eye uses drops  . History of blood transfusion    [facility]  . History of MRSA infection   . Hyperlipidemia   . Hypertension   . Hypothyroidism   . Impaired vision in both eyes    blind left eye, limited vision right eye  . Morbid obesity (HCC)    s/p Gastric sleeve- 8'17(loss thus far approx. 100 lbs)  . Peripheral vascular disease (HCC)    dvt right leg  . Pre-diabetes    prior to gastric sleeve    HPI:    Lauren SonWaneta B Clenney, 62 y.o. female, has a history of pain and functional disability in the left knee due to arthritis and has failed non-surgical conservative treatments for greater than 12 weeks to include NSAID's and/or analgesics, use of assistive devices and activity modification.  Onset of symptoms was gradual, starting 2+ years ago with gradually worsening course since that time. The patient noted prior procedures on the knee to include  arthroplasty on the right knee(s).  Patient currently rates pain in the left knee(s) at 8 out of 10 with activity. Patient has worsening of pain with activity and weight bearing, pain that interferes with activities of daily living, pain with passive range of motion, crepitus and joint swelling.  Patient has evidence of periarticular osteophytes and joint space narrowing by imaging studies. There is no active infection.   Risks, benefits and expectations were discussed with the patient.  Risks including but not limited to the risk of anesthesia, blood clots, nerve damage, blood vessel damage, failure of the prosthesis, infection and up to and including death.  Patient understand the risks, benefits and expectations and wishes to proceed with surgery.    PCP: Blane Oharaox, Kirsten, MD   Discharged Condition: good  Hospital Course:  Patient underwent the above stated procedure on 09/05/2017. Patient tolerated the procedure well and brought to the recovery room in good condition and subsequently to the floor.  POD #1 BP: 131/73 ; Pulse: 64 ; Temp: 98.4 F (36.9 C) ; Resp: 16 Patient reports pain as mild, pain controlled. No events throughout the night. Ready to get this knee better like her hip.  Ready to be discharged home. Dorsiflexion/plantar flexion intact, incision: dressing C/D/I, no cellulitis present and compartment soft.   LABS  Basename    HGB     10.9  HCT     34.4    Discharge Exam: General appearance: alert, cooperative and no distress Extremities: Homans sign is negative, no sign of DVT, no edema, redness or tenderness in the calves or thighs and no ulcers, gangrene or trophic changes  Disposition: Home with follow up in 2 weeks   Follow-up Information    Durene Romanslin, Christophere Hillhouse, MD. Schedule an appointment as soon as possible for a visit in 2 week(s).  Specialty:  Orthopedic Surgery Contact information: [address] [place] [address] [place] [address] [phone]           Discharge Instructions    Call MD / Call 911   Complete by:  As directed    If you experience chest pain or shortness of breath, CALL 911 and be transported to the hospital emergency room.  If you develope a fever above 101 F, pus (white drainage) or increased drainage or redness at the wound, or calf pain, call your surgeon's office.   Change dressing   Complete by:  As directed    Maintain surgical dressing until follow  up in the clinic. If the edges start to pull up, may reinforce with tape. If the dressing is no longer working, may remove and cover with gauze and tape, but must keep the area dry and clean.  Call with any questions or concerns.   Constipation Prevention   Complete by:  As directed    Drink plenty of fluids.  Prune juice may be helpful.  You may use a stool softener, such as Colace (over the counter) 100 mg twice a day.  Use MiraLax (over the counter) for constipation as needed.   Diet - low sodium heart healthy   Complete by:  As directed    Discharge instructions   Complete by:  As directed    Maintain surgical dressing until follow up in the clinic. If the edges start to pull up, may reinforce with tape. If the dressing is no longer working, may remove and cover with gauze and tape, but must keep the area dry and clean.  Follow up in 2 weeks at [facility]. Call with any questions or concerns.   Increase activity slowly as tolerated   Complete by:  As directed    Weight bearing as tolerated with assist device (walker, cane, etc) as directed, use it as long as suggested by your surgeon or therapist, typically at least 4-6 weeks.   TED hose   Complete by:  As directed    Use stockings (TED hose) for 2 weeks on both leg(s).  You may remove them at night for sleeping.      Allergies as of 09/06/2017      Reactions   Crestor [rosuvastatin Calcium] Other (See Comments)   Muscle aches   Lipitor [atorvastatin] Other (See Comments)   Muscle aches   Hctz [hydrochlorothiazide] Other (See Comments)   Pt doesn't remember      Medication List    STOP taking these medications   aspirin 325 MG tablet Replaced by:  aspirin 81 MG chewable tablet     TAKE these medications   aspirin 81 MG chewable tablet Commonly known as:  ASPIRIN CHILDRENS Chew 1 tablet (81 mg total) by mouth 2 (two) times daily. Take for 4 weeks, then resume regular dose. Replaces:  aspirin 325 MG tablet     CELEBRATE CALCIUM CITRATE 500-500 MG-UNIT chewable tablet Generic drug:  calcium citrate-vitamin D Chew 1 tablet by mouth 3 (three) times daily.   CELEBRATE MULTI-COMPLETE 73 Chew Chew 2 tablets by mouth daily at 12 noon.   celecoxib 200 MG capsule Commonly known as:  CELEBREX Take 1 capsule (200 mg total) by mouth 2 (two) times daily. Take with Pepcid to help avoid GI issues.   DILT-XR 180 MG 24 hr capsule Generic drug:  diltiazem Take 180 mg by mouth daily.   docusate sodium 100 MG capsule Commonly known as:  COLACE  Take 1 capsule (100 mg total) by mouth 2 (two) times daily.   ferrous sulfate 325 (65 FE) MG tablet Commonly known as:  FERROUSUL Take 1 tablet (325 mg total) by mouth 3 (three) times daily with meals.   HYDROcodone-acetaminophen 7.5-325 MG tablet Commonly known as:  NORCO Take 1-2 tablets by mouth every 4 (four) hours as needed for moderate pain.   latanoprost 0.005 % ophthalmic solution Commonly known as:  XALATAN Place 1 drop into the right eye at bedtime.   levothyroxine 88 MCG tablet Commonly known as:  SYNTHROID, LEVOTHROID Take 88 mcg by mouth daily before breakfast.   Magnesium 250 MG Tabs Take 500-750 mg by mouth daily. Varies based on leg cramping.   methocarbamol 500 MG tablet Commonly known as:  ROBAXIN Take 1 tablet (500 mg total) by mouth every 6 (six) hours as needed for muscle spasms.   polyethylene glycol packet Commonly known as:  MIRALAX / GLYCOLAX Take 17 g by mouth 2 (two) times daily.   rosuvastatin 5 MG tablet Commonly known as:  CRESTOR Take 5 mg by mouth every evening.   telmisartan 40 MG tablet Commonly known as:  MICARDIS Take 40 mg by mouth daily.   Vitamin B-12 2500 MCG Subl Place 5,000 mcg under the tongue daily at 12 noon.            Discharge Care Instructions  (From admission, onward)        Start     Ordered   09/06/17 0000  Change dressing    Comments:  Maintain surgical dressing until follow up in  the clinic. If the edges start to pull up, may reinforce with tape. If the dressing is no longer working, may remove and cover with gauze and tape, but must keep the area dry and clean.  Call with any questions or concerns.   09/06/17 1610       Signed: Anastasio Auerbach. Mikayla Chiusano   PA-C  09/06/2017, 7:44 AM

## 2017-09-06 NOTE — Progress Notes (Signed)
Discharge planning, spoke with patient at bedside. Have chosen [facility] for HH PT, evaluate and treat. Contacted [facility] for referral. Has DME. [phone] 

## 2017-09-06 NOTE — Evaluation (Signed)
Physical Therapy Evaluation Patient Details Name: Lauren Clark MRN: [id number] DOB: Jun 21, 1956 Today's Date: 09/06/2017   History of Present Illness  Pt s/p L TKR and with hx of R THR and R TKR  Clinical Impression  Pt s/p L TKR and presents with decreased L LE strength/ROM and post op pain limiting functional mobility.  Pt should progress to dc home with assist of family/friends and HHPT follow up.    Follow Up Recommendations Home health PT    Equipment Recommendations  None recommended by PT    Recommendations for Other Services       Precautions / Restrictions Precautions Precautions: Knee;Fall Restrictions Weight Bearing Restrictions: No Other Position/Activity Restrictions: WBAT      Mobility  Bed Mobility Overal bed mobility: Needs Assistance Bed Mobility: Supine to Sit     Supine to sit: Supervision     General bed mobility comments: min cues for sequence and use of R LE to self assist  Transfers Overall transfer level: Needs assistance Equipment used: Rolling walker (2 wheeled) Transfers: Sit to/from Stand Sit to Stand: Min guard         General transfer comment: cues for LE management and use of UEs to self assist  Ambulation/Gait Ambulation/Gait assistance: Min assist;Min guard Ambulation Distance (Feet): 94 Feet Assistive device: Rolling walker (2 wheeled) Gait Pattern/deviations: Shuffle;Step-to pattern;Step-through pattern;Decreased step length - right;Decreased step length - left;Trunk flexed Gait velocity: decr Gait velocity interpretation: Below normal speed for age/gender General Gait Details: cues for sequence, posture and position from [employer]  Stairs            Wheelchair Mobility    Modified Rankin (Stroke Patients Only)       Balance Overall balance assessment: No apparent balance deficits (not formally assessed)                                           Pertinent Vitals/Pain Pain Assessment: 0-10 Pain  Score: 4  Pain Location: L knee Pain Descriptors / Indicators: Aching;Sore Pain Intervention(s): Limited activity within patient's tolerance;Monitored during session;Premedicated before session;Ice applied    Home Living Family/patient expects to be discharged to:: Private residence Living Arrangements: Alone Available Help at Discharge: Available 24 hours/day;Family;Friend(s) Type of Home: Apartment Home Access: Level entry     Home Layout: One level Home Equipment: Walker - 2 wheels;Walker - 4 wheels;Bedside commode;Cane - single point      Prior Function Level of Independence: Independent;Independent with assistive device(s)         Comments: Used cane as needed     Hand Dominance        Extremity/Trunk Assessment   Upper Extremity Assessment Upper Extremity Assessment: Overall WFL for tasks assessed    Lower Extremity Assessment Lower Extremity Assessment: LLE deficits/detail LLE Deficits / Details: 3-/5 quads with AAROM at knee -10 - 60    Cervical / Trunk Assessment Cervical / Trunk Assessment: Kyphotic  Communication   Communication: No difficulties  Cognition Arousal/Alertness: Awake/alert Behavior During Therapy: WFL for tasks assessed/performed Overall Cognitive Status: Within Functional Limits for tasks assessed                                        General Comments      Exercises Total Joint Exercises Ankle Circles/Pumps:  AROM;Both;Supine;20 reps Quad Sets: AROM;Both;10 reps;Supine Heel Slides: AAROM;Left;15 reps;Supine Straight Leg Raises: AAROM;AROM;Left;15 reps;Supine   Assessment/Plan    PT Assessment Patient needs continued PT services  PT Problem List Decreased strength;Decreased range of motion;Decreased activity tolerance;Decreased mobility;Decreased knowledge of use of DME;Pain;Obesity       PT Treatment Interventions DME instruction;Gait training;Functional mobility training;Therapeutic activities;Therapeutic  exercise;Patient/family education    PT Goals (Current goals can be found in the Care Plan section)  Acute Rehab PT Goals Patient Stated Goal: Regain IND and not need anymore joints replaced PT Goal Formulation: With patient Time For Goal Achievement: 09/09/17 Potential to Achieve Goals: Good    Frequency 7X/week   Barriers to discharge        Co-evaluation               AM-PAC PT "6 Clicks" Daily Activity  Outcome Measure Difficulty turning over in bed (including adjusting bedclothes, sheets and blankets)?: A Little Difficulty moving from lying on back to sitting on the side of the bed? : A Little Difficulty sitting down on and standing up from a chair with arms (e.g., wheelchair, bedside commode, etc,.)?: A Little Help needed moving to and from a bed to chair (including a wheelchair)?: A Little Help needed walking in hospital room?: A Little Help needed climbing 3-5 steps with a railing? : A Little 6 Click Score: 18    End of Session Equipment Utilized During Treatment: Gait belt Activity Tolerance: Patient tolerated treatment well Patient left: in chair;with call bell/phone within reach Nurse Communication: Mobility status PT Visit Diagnosis: Difficulty in walking, not elsewhere classified (R26.2)    Time: 6962-9528 PT Time Calculation (min) (ACUTE ONLY): 35 min   Charges:   PT Evaluation $PT Eval Low Complexity: 1 Low PT Treatments $Therapeutic Exercise: 8-22 mins   PT G Codes:        Pg [phone]   Lauren Clark 09/06/2017, 12:44 PM

## 2017-09-07 DIAGNOSIS — Z471 Aftercare following joint replacement surgery: Secondary | ICD-10-CM | POA: Diagnosis not present

## 2017-09-07 DIAGNOSIS — I10 Essential (primary) hypertension: Secondary | ICD-10-CM | POA: Diagnosis not present

## 2017-09-07 DIAGNOSIS — I739 Peripheral vascular disease, unspecified: Secondary | ICD-10-CM | POA: Diagnosis not present

## 2017-09-07 DIAGNOSIS — F329 Major depressive disorder, single episode, unspecified: Secondary | ICD-10-CM | POA: Diagnosis not present

## 2017-09-07 DIAGNOSIS — H409 Unspecified glaucoma: Secondary | ICD-10-CM | POA: Diagnosis not present

## 2017-09-07 DIAGNOSIS — F419 Anxiety disorder, unspecified: Secondary | ICD-10-CM | POA: Diagnosis not present

## 2017-09-11 ENCOUNTER — Other Ambulatory Visit: Payer: Self-pay

## 2017-09-11 DIAGNOSIS — H409 Unspecified glaucoma: Secondary | ICD-10-CM | POA: Diagnosis not present

## 2017-09-11 DIAGNOSIS — I10 Essential (primary) hypertension: Secondary | ICD-10-CM | POA: Diagnosis not present

## 2017-09-11 DIAGNOSIS — I739 Peripheral vascular disease, unspecified: Secondary | ICD-10-CM | POA: Diagnosis not present

## 2017-09-11 DIAGNOSIS — F419 Anxiety disorder, unspecified: Secondary | ICD-10-CM | POA: Diagnosis not present

## 2017-09-11 DIAGNOSIS — F329 Major depressive disorder, single episode, unspecified: Secondary | ICD-10-CM | POA: Diagnosis not present

## 2017-09-11 DIAGNOSIS — Z471 Aftercare following joint replacement surgery: Secondary | ICD-10-CM | POA: Diagnosis not present

## 2017-09-11 NOTE — Progress Notes (Signed)
This encounter was created in error - please disregard.

## 2017-09-11 NOTE — Patient Outreach (Signed)
[facility] Care Management  09/11/2017  Lauren Clark 24-Feb-1956 [id number]   Referral Date: 09/11/17  Referral Source: [employer] Report Date of Admission: 09/05/17 Diagnosis:  Left knee replacement  Date of Discharge: 09/06/17 [facility]   Outreach attempt # 1 Spoke with patient she is able to verify HIPAA. Patient reports that she is doing good since being home. Asked patient about assistance if she needs.  She states she does have support.  Patient states that home health has been twice to see her.    Social: Patient lives alone with her cat but states she has support to help her when needed.    Conditions: Patient most recently had a left knee replacement.  Patient also has a history of HTN, GERD, and Arthritis.    Medications: Patient takes her medication as prescribed and denies any problems with medications.    Appointments: Patient has appointment next week with the orthopedist and has transportation.  Patient states she saw her primary care physician right before her surgery and has appointment again in April.  Discussed following up with primary doctor after hospitalization.  She verbalized understanding.    Consent: RN CM [facility]. Patient declined services.    Plan: RN CM will send letter and brochure. RN CM will close case and notify care management assistant of case status.     Bary Lericheionne J Ramiel Forti, RN, MSN [facility] Care Management Care Management Coordinator Direct Line [phone] Toll Free: [phone]  Fax: [phone]

## 2017-09-11 NOTE — Addendum Note (Signed)
Addended by: Marlow BaarsELKINS, Darrielle Pflieger L on: 09/11/2017 12:12 PM   Modules accepted: Level of Service, SmartSet

## 2017-09-13 DIAGNOSIS — H409 Unspecified glaucoma: Secondary | ICD-10-CM | POA: Diagnosis not present

## 2017-09-13 DIAGNOSIS — Z471 Aftercare following joint replacement surgery: Secondary | ICD-10-CM | POA: Diagnosis not present

## 2017-09-13 DIAGNOSIS — I739 Peripheral vascular disease, unspecified: Secondary | ICD-10-CM | POA: Diagnosis not present

## 2017-09-13 DIAGNOSIS — I10 Essential (primary) hypertension: Secondary | ICD-10-CM | POA: Diagnosis not present

## 2017-09-13 DIAGNOSIS — F329 Major depressive disorder, single episode, unspecified: Secondary | ICD-10-CM | POA: Diagnosis not present

## 2017-09-13 DIAGNOSIS — F419 Anxiety disorder, unspecified: Secondary | ICD-10-CM | POA: Diagnosis not present

## 2017-09-18 DIAGNOSIS — I10 Essential (primary) hypertension: Secondary | ICD-10-CM | POA: Diagnosis not present

## 2017-09-18 DIAGNOSIS — F419 Anxiety disorder, unspecified: Secondary | ICD-10-CM | POA: Diagnosis not present

## 2017-09-18 DIAGNOSIS — H409 Unspecified glaucoma: Secondary | ICD-10-CM | POA: Diagnosis not present

## 2017-09-18 DIAGNOSIS — I739 Peripheral vascular disease, unspecified: Secondary | ICD-10-CM | POA: Diagnosis not present

## 2017-09-18 DIAGNOSIS — Z471 Aftercare following joint replacement surgery: Secondary | ICD-10-CM | POA: Diagnosis not present

## 2017-09-18 DIAGNOSIS — F329 Major depressive disorder, single episode, unspecified: Secondary | ICD-10-CM | POA: Diagnosis not present

## 2017-09-19 DIAGNOSIS — H409 Unspecified glaucoma: Secondary | ICD-10-CM | POA: Diagnosis not present

## 2017-09-19 DIAGNOSIS — F419 Anxiety disorder, unspecified: Secondary | ICD-10-CM | POA: Diagnosis not present

## 2017-09-19 DIAGNOSIS — I10 Essential (primary) hypertension: Secondary | ICD-10-CM | POA: Diagnosis not present

## 2017-09-19 DIAGNOSIS — Z471 Aftercare following joint replacement surgery: Secondary | ICD-10-CM | POA: Diagnosis not present

## 2017-09-19 DIAGNOSIS — I739 Peripheral vascular disease, unspecified: Secondary | ICD-10-CM | POA: Diagnosis not present

## 2017-09-19 DIAGNOSIS — F329 Major depressive disorder, single episode, unspecified: Secondary | ICD-10-CM | POA: Diagnosis not present

## 2017-10-04 DIAGNOSIS — R7301 Impaired fasting glucose: Secondary | ICD-10-CM | POA: Diagnosis not present

## 2017-10-04 DIAGNOSIS — E038 Other specified hypothyroidism: Secondary | ICD-10-CM | POA: Diagnosis not present

## 2017-10-04 DIAGNOSIS — Z6837 Body mass index (BMI) 37.0-37.9, adult: Secondary | ICD-10-CM | POA: Diagnosis not present

## 2017-10-04 DIAGNOSIS — I10 Essential (primary) hypertension: Secondary | ICD-10-CM | POA: Diagnosis not present

## 2017-10-04 DIAGNOSIS — M25662 Stiffness of left knee, not elsewhere classified: Secondary | ICD-10-CM | POA: Diagnosis not present

## 2017-10-04 DIAGNOSIS — E782 Mixed hyperlipidemia: Secondary | ICD-10-CM | POA: Diagnosis not present

## 2017-10-04 DIAGNOSIS — E6609 Other obesity due to excess calories: Secondary | ICD-10-CM | POA: Diagnosis not present

## 2017-10-26 DIAGNOSIS — M25562 Pain in left knee: Secondary | ICD-10-CM | POA: Diagnosis not present

## 2017-10-27 DIAGNOSIS — H40013 Open angle with borderline findings, low risk, bilateral: Secondary | ICD-10-CM | POA: Diagnosis not present

## 2017-12-06 IMAGING — DX DG HIP (WITH OR WITHOUT PELVIS) 1V PORT*R*
2 series · 2 of 2 positions shown · non-contrast
Comparison: None.

CLINICAL DATA: Postop right hip replaced

EXAM:
DG HIP (WITH OR WITHOUT PELVIS) 1V PORT RIGHT

[pelvis ap]
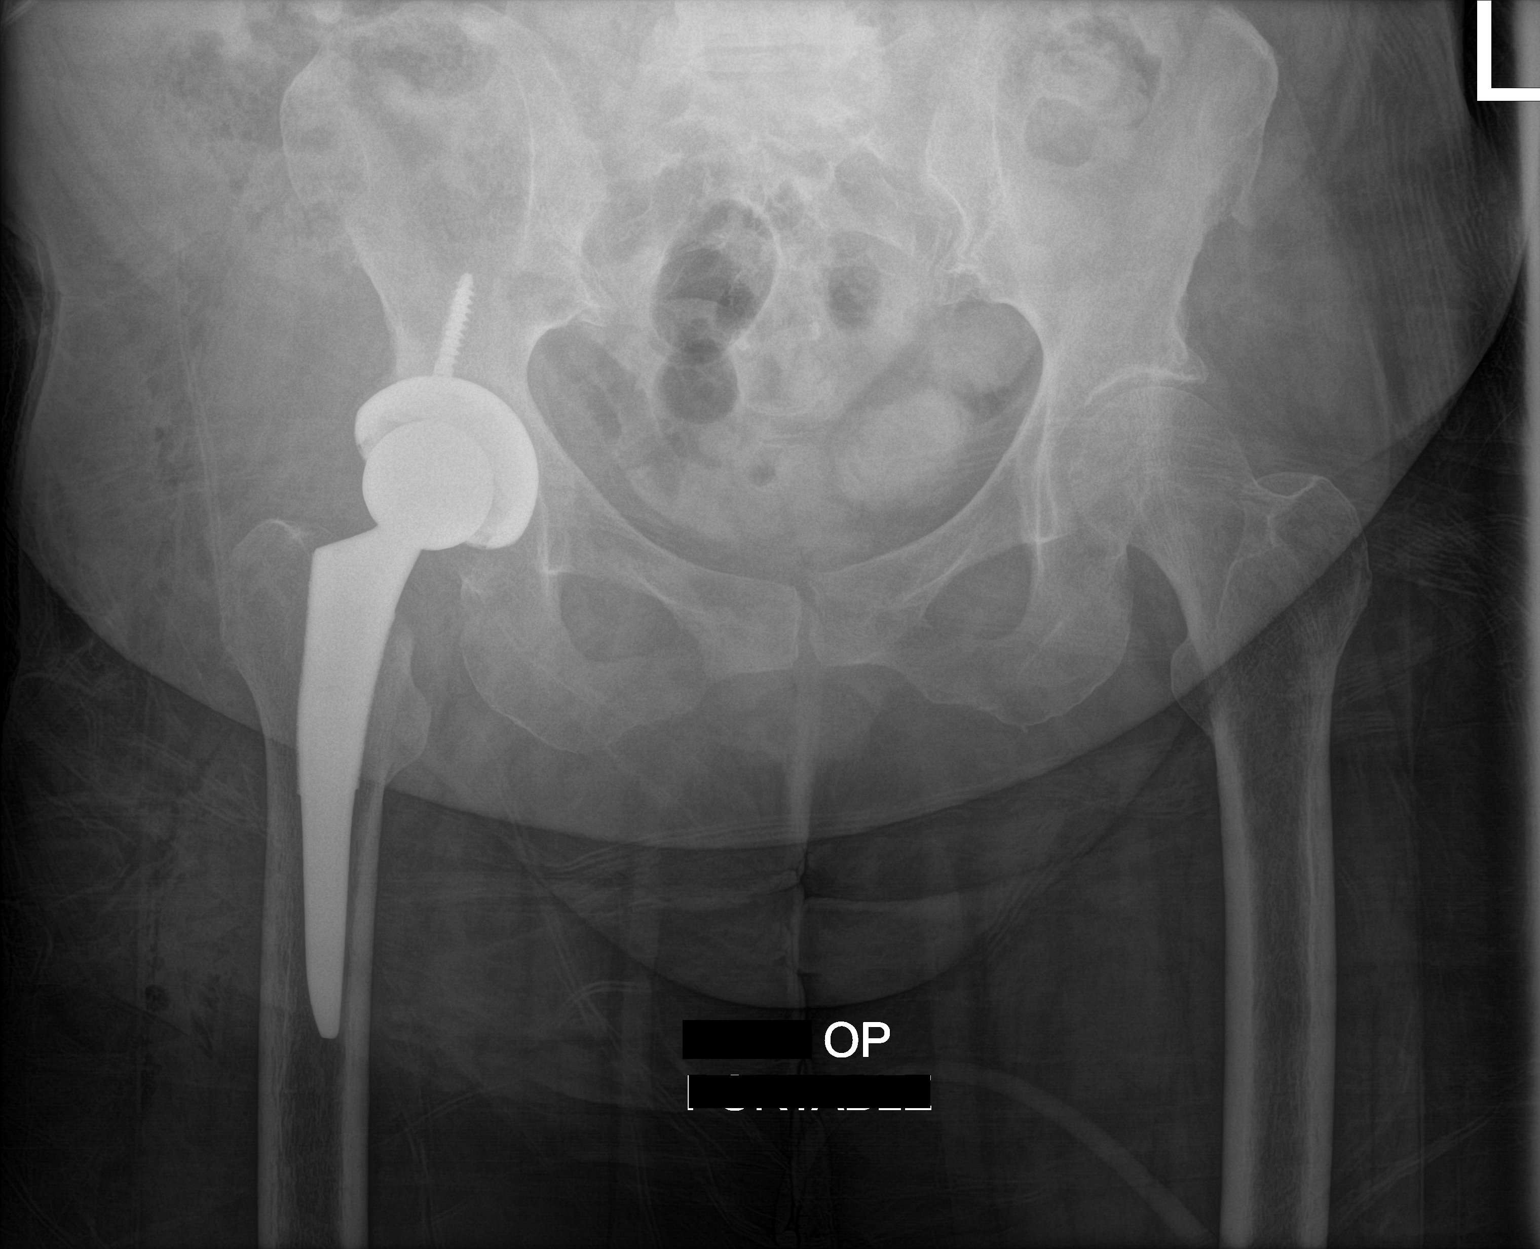

[hip lat]
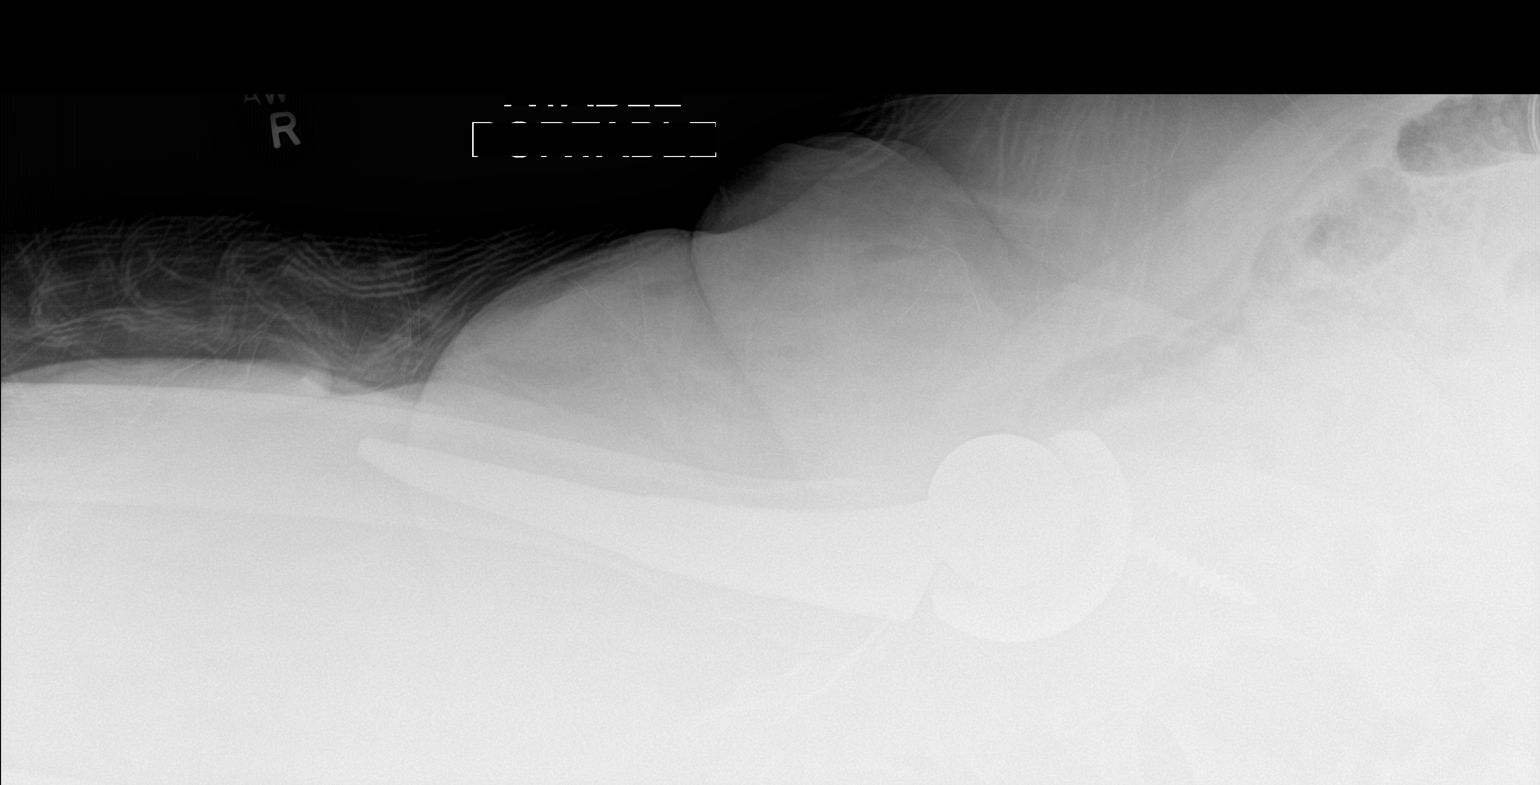

[2 of 2 positions shown; findings below may reference images not displayed]

FINDINGS: The patient is status post right hip replacement with satisfactory
alignment. Small amount of soft tissue gas lateral hip felt related
to recent surgery. Lucency at the right inferior pubic ramus does
not appear convincing for a fracture.
IMPRESSION: Status post right hip replacement with satisfactory alignment.

## 2018-03-13 ENCOUNTER — Other Ambulatory Visit: Payer: Self-pay | Admitting: Family Medicine

## 2018-03-13 DIAGNOSIS — Z1231 Encounter for screening mammogram for malignant neoplasm of breast: Secondary | ICD-10-CM

## 2018-04-03 DIAGNOSIS — H40013 Open angle with borderline findings, low risk, bilateral: Secondary | ICD-10-CM | POA: Diagnosis not present

## 2018-04-05 DIAGNOSIS — Z23 Encounter for immunization: Secondary | ICD-10-CM | POA: Diagnosis not present

## 2018-04-05 DIAGNOSIS — I10 Essential (primary) hypertension: Secondary | ICD-10-CM | POA: Diagnosis not present

## 2018-04-05 DIAGNOSIS — E782 Mixed hyperlipidemia: Secondary | ICD-10-CM | POA: Diagnosis not present

## 2018-04-05 DIAGNOSIS — E038 Other specified hypothyroidism: Secondary | ICD-10-CM | POA: Diagnosis not present

## 2018-04-05 DIAGNOSIS — Z0001 Encounter for general adult medical examination with abnormal findings: Secondary | ICD-10-CM | POA: Diagnosis not present

## 2018-04-05 DIAGNOSIS — Z6839 Body mass index (BMI) 39.0-39.9, adult: Secondary | ICD-10-CM | POA: Diagnosis not present

## 2018-04-05 DIAGNOSIS — K912 Postsurgical malabsorption, not elsewhere classified: Secondary | ICD-10-CM | POA: Diagnosis not present

## 2018-04-05 DIAGNOSIS — R7301 Impaired fasting glucose: Secondary | ICD-10-CM | POA: Diagnosis not present

## 2018-04-11 ENCOUNTER — Ambulatory Visit
Admission: RE | Admit: 2018-04-11 | Discharge: 2018-04-11 | Disposition: A | Discharge status: Home / Self Care | Payer: Medicare HMO | Source: Ambulatory Visit | Attending: Family Medicine | Admitting: Family Medicine

## 2018-04-11 DIAGNOSIS — Z1231 Encounter for screening mammogram for malignant neoplasm of breast: Secondary | ICD-10-CM | POA: Diagnosis not present

## 2018-04-18 DIAGNOSIS — Z9884 Bariatric surgery status: Secondary | ICD-10-CM | POA: Diagnosis not present

## 2018-04-18 DIAGNOSIS — E669 Obesity, unspecified: Secondary | ICD-10-CM | POA: Diagnosis not present

## 2018-04-18 DIAGNOSIS — I10 Essential (primary) hypertension: Secondary | ICD-10-CM | POA: Diagnosis not present

## 2018-04-18 DIAGNOSIS — M1611 Unilateral primary osteoarthritis, right hip: Secondary | ICD-10-CM | POA: Diagnosis not present

## 2018-10-22 DIAGNOSIS — Z96653 Presence of artificial knee joint, bilateral: Secondary | ICD-10-CM | POA: Diagnosis not present

## 2018-10-22 DIAGNOSIS — Z471 Aftercare following joint replacement surgery: Secondary | ICD-10-CM | POA: Diagnosis not present

## 2018-10-22 DIAGNOSIS — Z96641 Presence of right artificial hip joint: Secondary | ICD-10-CM | POA: Diagnosis not present

## 2018-10-22 DIAGNOSIS — Z96652 Presence of left artificial knee joint: Secondary | ICD-10-CM | POA: Diagnosis not present

## 2018-10-22 DIAGNOSIS — M25561 Pain in right knee: Secondary | ICD-10-CM | POA: Diagnosis not present

## 2018-11-14 DIAGNOSIS — E782 Mixed hyperlipidemia: Secondary | ICD-10-CM | POA: Diagnosis not present

## 2018-11-14 DIAGNOSIS — E038 Other specified hypothyroidism: Secondary | ICD-10-CM | POA: Diagnosis not present

## 2018-11-14 DIAGNOSIS — I10 Essential (primary) hypertension: Secondary | ICD-10-CM | POA: Diagnosis not present

## 2018-11-14 DIAGNOSIS — R7301 Impaired fasting glucose: Secondary | ICD-10-CM | POA: Diagnosis not present

## 2018-11-14 DIAGNOSIS — E6609 Other obesity due to excess calories: Secondary | ICD-10-CM | POA: Diagnosis not present

## 2018-11-14 DIAGNOSIS — F331 Major depressive disorder, recurrent, moderate: Secondary | ICD-10-CM | POA: Diagnosis not present

## 2018-11-14 DIAGNOSIS — Z6837 Body mass index (BMI) 37.0-37.9, adult: Secondary | ICD-10-CM | POA: Diagnosis not present

## 2018-12-18 DIAGNOSIS — Z6841 Body Mass Index (BMI) 40.0 and over, adult: Secondary | ICD-10-CM | POA: Diagnosis not present

## 2018-12-18 DIAGNOSIS — E782 Mixed hyperlipidemia: Secondary | ICD-10-CM | POA: Diagnosis not present

## 2019-01-17 DIAGNOSIS — E782 Mixed hyperlipidemia: Secondary | ICD-10-CM | POA: Diagnosis not present

## 2019-01-17 DIAGNOSIS — Z6841 Body Mass Index (BMI) 40.0 and over, adult: Secondary | ICD-10-CM | POA: Diagnosis not present

## 2019-02-25 DIAGNOSIS — Z6841 Body Mass Index (BMI) 40.0 and over, adult: Secondary | ICD-10-CM | POA: Diagnosis not present

## 2019-02-25 DIAGNOSIS — E782 Mixed hyperlipidemia: Secondary | ICD-10-CM | POA: Diagnosis not present

## 2019-03-13 ENCOUNTER — Other Ambulatory Visit: Payer: Self-pay | Admitting: Family Medicine

## 2019-03-13 DIAGNOSIS — Z1231 Encounter for screening mammogram for malignant neoplasm of breast: Secondary | ICD-10-CM

## 2019-04-10 DIAGNOSIS — R7301 Impaired fasting glucose: Secondary | ICD-10-CM | POA: Diagnosis not present

## 2019-04-10 DIAGNOSIS — Z23 Encounter for immunization: Secondary | ICD-10-CM | POA: Diagnosis not present

## 2019-04-10 DIAGNOSIS — E782 Mixed hyperlipidemia: Secondary | ICD-10-CM | POA: Diagnosis not present

## 2019-04-10 DIAGNOSIS — I10 Essential (primary) hypertension: Secondary | ICD-10-CM | POA: Diagnosis not present

## 2019-04-25 DIAGNOSIS — M1611 Unilateral primary osteoarthritis, right hip: Secondary | ICD-10-CM | POA: Diagnosis not present

## 2019-04-25 DIAGNOSIS — I10 Essential (primary) hypertension: Secondary | ICD-10-CM | POA: Diagnosis not present

## 2019-04-25 DIAGNOSIS — E669 Obesity, unspecified: Secondary | ICD-10-CM | POA: Diagnosis not present

## 2019-04-25 DIAGNOSIS — R7303 Prediabetes: Secondary | ICD-10-CM | POA: Diagnosis not present

## 2019-04-25 DIAGNOSIS — Z9884 Bariatric surgery status: Secondary | ICD-10-CM | POA: Diagnosis not present

## 2019-04-30 ENCOUNTER — Ambulatory Visit
Admission: RE | Admit: 2019-04-30 | Discharge: 2019-04-30 | Disposition: A | Discharge status: Home / Self Care | Payer: Medicare HMO | Source: Ambulatory Visit | Attending: Family Medicine | Admitting: Family Medicine

## 2019-04-30 ENCOUNTER — Other Ambulatory Visit: Payer: Self-pay

## 2019-04-30 DIAGNOSIS — Z1231 Encounter for screening mammogram for malignant neoplasm of breast: Secondary | ICD-10-CM

## 2019-05-02 ENCOUNTER — Other Ambulatory Visit: Payer: Self-pay | Admitting: Family Medicine

## 2019-05-02 DIAGNOSIS — N6489 Other specified disorders of breast: Secondary | ICD-10-CM

## 2019-05-09 DIAGNOSIS — H2701 Aphakia, right eye: Secondary | ICD-10-CM | POA: Diagnosis not present

## 2019-05-09 DIAGNOSIS — H524 Presbyopia: Secondary | ICD-10-CM | POA: Diagnosis not present

## 2019-05-09 DIAGNOSIS — H5201 Hypermetropia, right eye: Secondary | ICD-10-CM | POA: Diagnosis not present

## 2019-05-13 ENCOUNTER — Other Ambulatory Visit: Payer: Self-pay

## 2019-05-13 ENCOUNTER — Ambulatory Visit
Admission: RE | Admit: 2019-05-13 | Discharge: 2019-05-13 | Disposition: A | Discharge status: Home / Self Care | Payer: Medicare HMO | Source: Ambulatory Visit | Attending: Family Medicine | Admitting: Family Medicine

## 2019-05-13 ENCOUNTER — Ambulatory Visit: Admission: RE | Admit: 2019-05-13 | Payer: Medicare HMO | Source: Ambulatory Visit

## 2019-05-13 DIAGNOSIS — N6489 Other specified disorders of breast: Secondary | ICD-10-CM

## 2019-05-13 DIAGNOSIS — R928 Other abnormal and inconclusive findings on diagnostic imaging of breast: Secondary | ICD-10-CM | POA: Diagnosis not present

## 2019-06-06 DIAGNOSIS — E782 Mixed hyperlipidemia: Secondary | ICD-10-CM | POA: Diagnosis not present

## 2019-06-06 DIAGNOSIS — Z6839 Body mass index (BMI) 39.0-39.9, adult: Secondary | ICD-10-CM | POA: Diagnosis not present

## 2019-08-12 ENCOUNTER — Encounter: Payer: Self-pay | Admitting: Family

## 2019-09-02 ENCOUNTER — Ambulatory Visit (INDEPENDENT_AMBULATORY_CARE_PROVIDER_SITE_OTHER): Payer: Medicare HMO | Admitting: Family

## 2019-09-02 ENCOUNTER — Encounter: Payer: Self-pay | Admitting: Family

## 2019-09-02 ENCOUNTER — Other Ambulatory Visit: Payer: Self-pay

## 2019-09-02 VITALS — BP 135/84 | HR 67

## 2019-09-02 DIAGNOSIS — R7689 Other specified abnormal immunological findings in serum: Secondary | ICD-10-CM | POA: Insufficient documentation

## 2019-09-02 DIAGNOSIS — R768 Other specified abnormal immunological findings in serum: Secondary | ICD-10-CM | POA: Insufficient documentation

## 2019-09-02 NOTE — Assessment & Plan Note (Signed)
[name] is a 64 year old female with positive hepatitis C antibody testing and negative hepatitis C RNA levels.  She is asymptomatic and treatment nave.  Risk factor for acquiring hepatitis C is age she was born between [birth year] and [birth year].  Given no viral load present she is likely one of the 25% of individuals who cleared hepatitis C independently and requires no further treatments.  Hepatitis C antibody testing will remain positive and if additional screening is needed in the future will need hepatitis C RNA levels.  We are happy to see her back at any time should the need arise.
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
## 2019-09-02 NOTE — Patient Instructions (Signed)
Nice to see you.  Your blood work shows that you have previously had Hepatitis C and there is no viral load present indicating no need for treatment.  If you are needing screening in the future please have a provider check your Hepatitis C RNA level as the Antibody test will always be positive.   Let us know if you have any questions.  Have a great day and stay safe!

## 2019-09-05 ENCOUNTER — Ambulatory Visit: Payer: Medicare HMO | Admitting: Family Medicine

## 2019-10-03 IMAGING — MG DIGITAL SCREENING BILATERAL MAMMOGRAM WITH TOMO AND CAD
8 of 16 series · 8 of 40 positions shown · non-contrast
Comparison: Previous exam(s).

CLINICAL DATA: Screening.

EXAM:
DIGITAL SCREENING BILATERAL MAMMOGRAM WITH TOMO AND CAD

[L MLO synth-2D (1 of 2)]
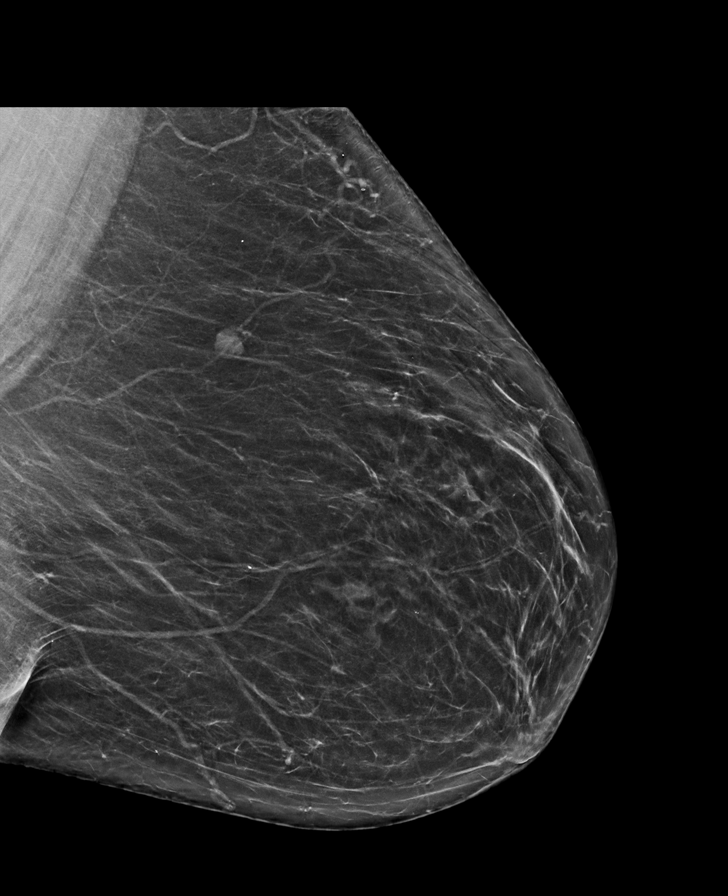

[L CC synth-2D (1 of 4)]
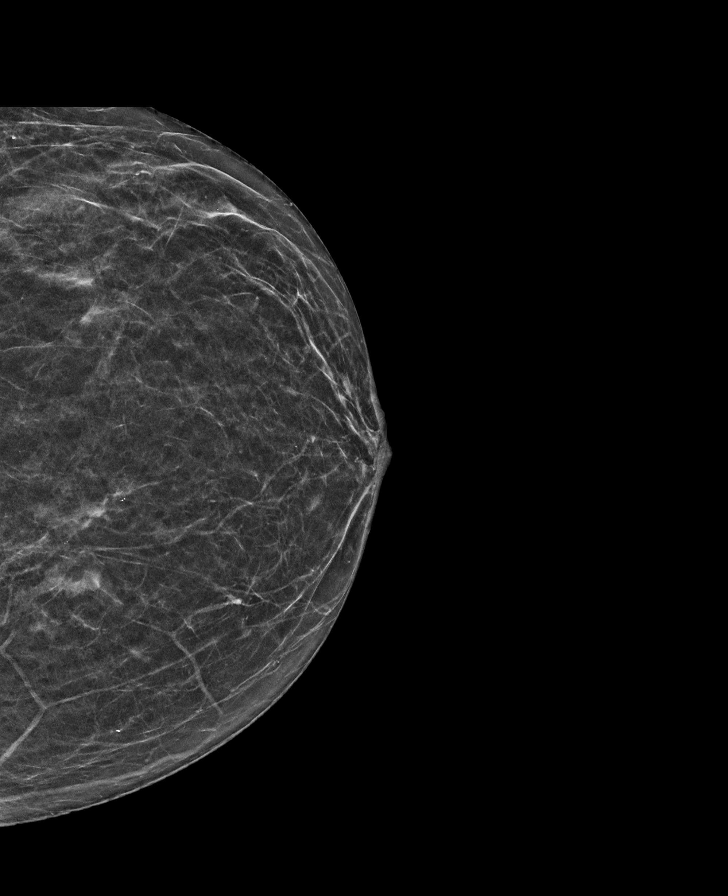

[L MLO synth-2D (2 of 2)]
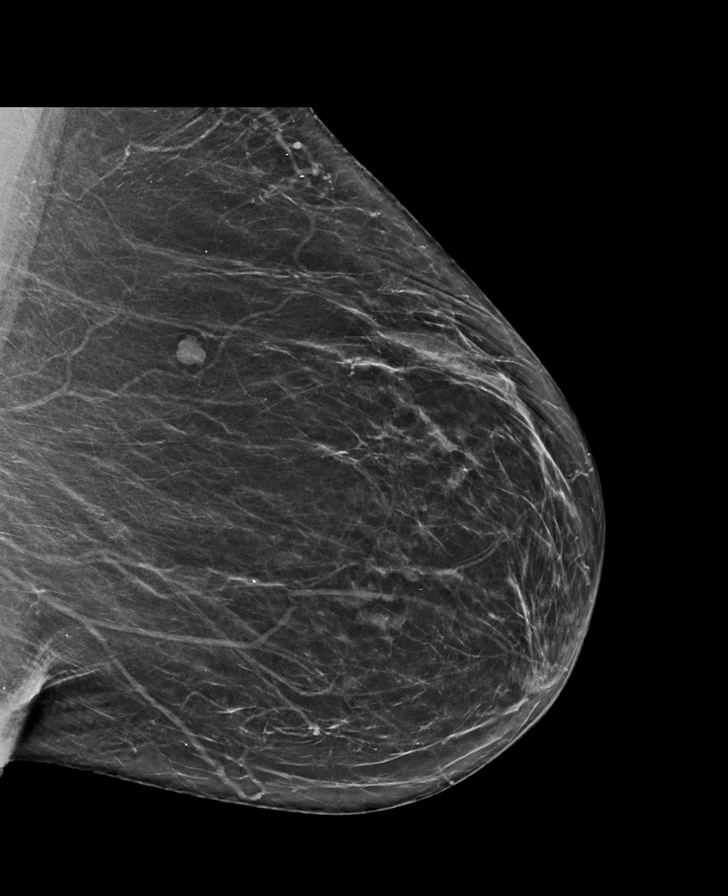

[L CC synth-2D (2 of 4)]
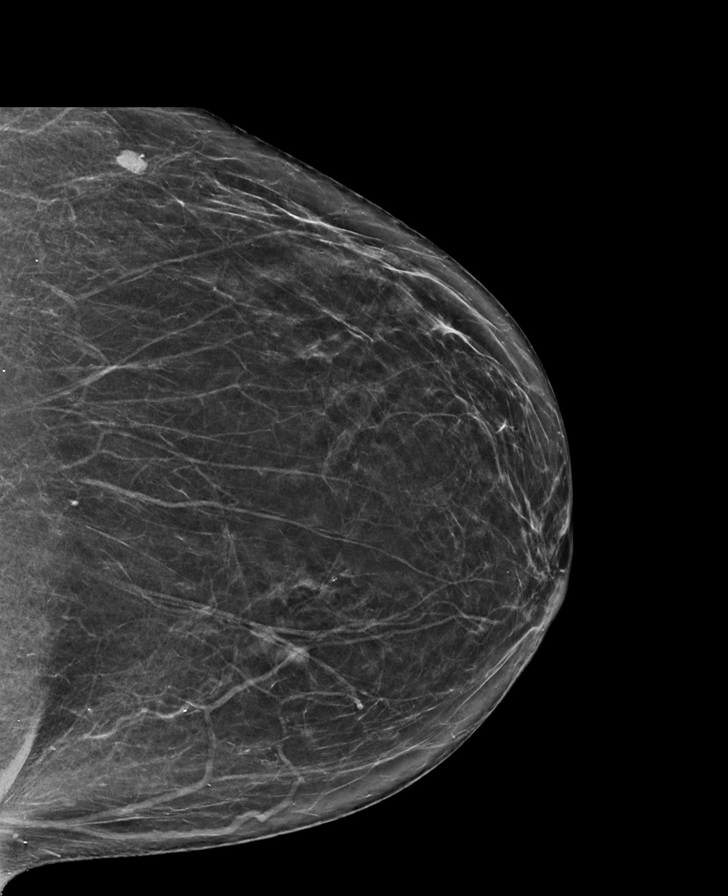

[R CC synth-2D]
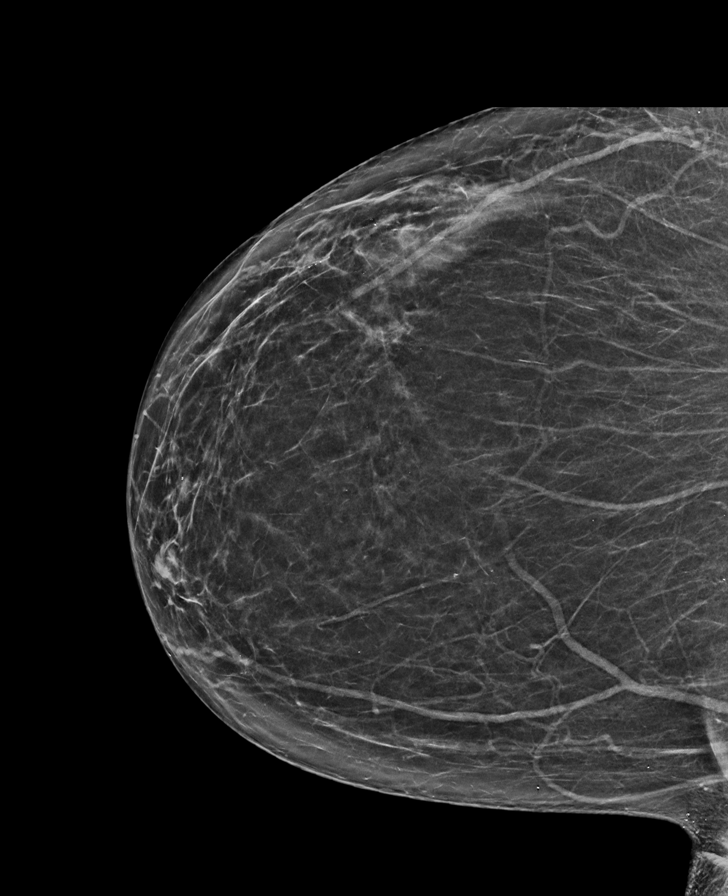

[L CC synth-2D (3 of 4)]
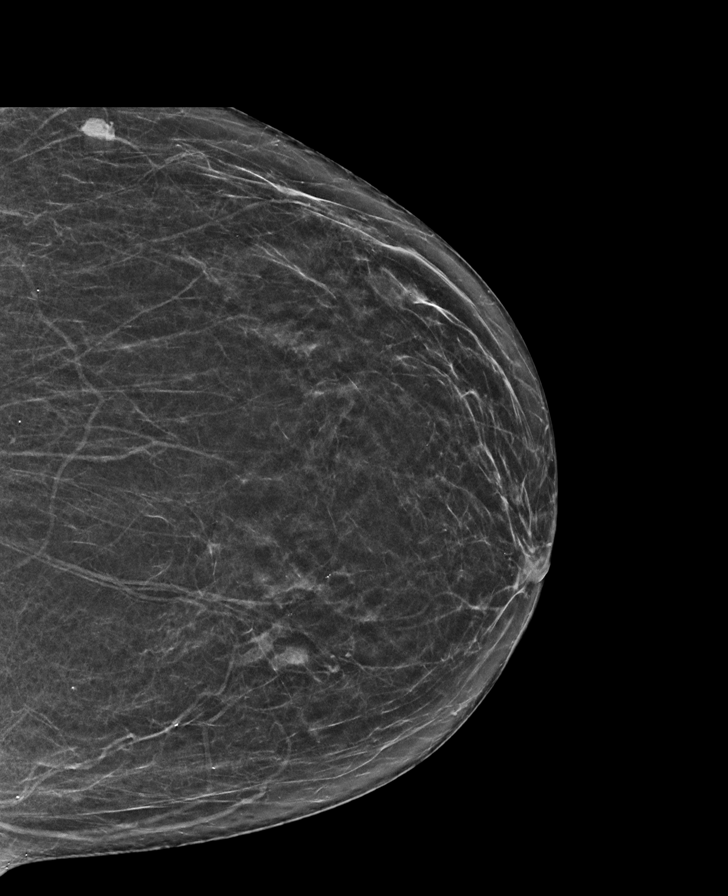

[L CC synth-2D (4 of 4)]
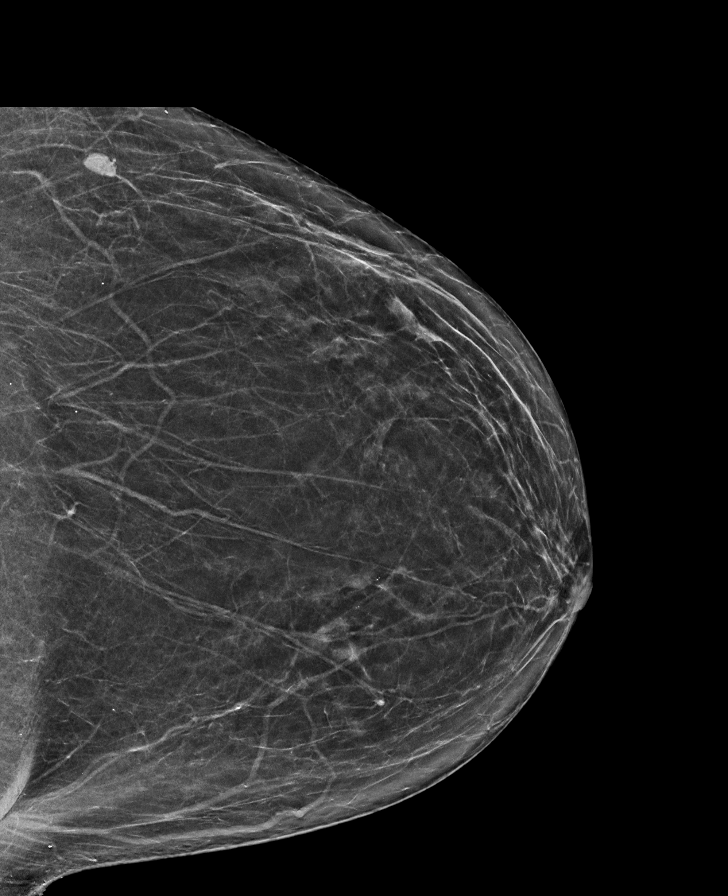

[L CC tomo · tomo slice 31/61.0]
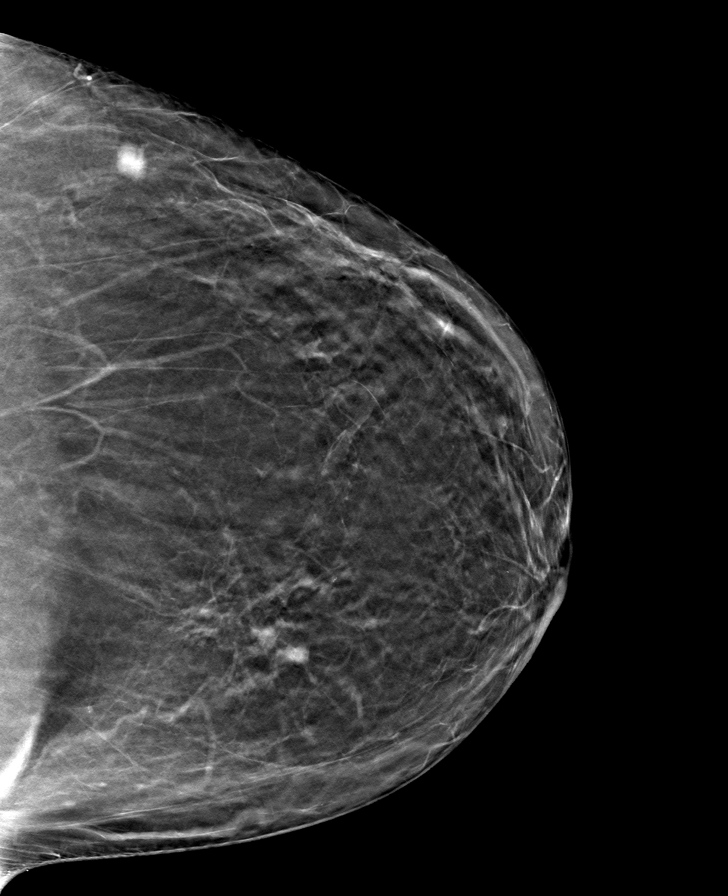

[8 of 40 positions shown; findings below may reference images not displayed]

ACR Breast Density Category b: There are scattered areas of
fibroglandular density.
FINDINGS: There are no findings suspicious for malignancy. Images were
processed with CAD.
IMPRESSION: No mammographic evidence of malignancy. A result letter of this
screening mammogram will be mailed directly to the patient.

RECOMMENDATION:
Screening mammogram in one year. (Code:[id number])

BI-RADS CATEGORY  1: Negative.

## 2019-10-09 ENCOUNTER — Other Ambulatory Visit: Payer: Self-pay | Admitting: Family Medicine

## 2020-02-27 ENCOUNTER — Other Ambulatory Visit: Payer: Self-pay | Admitting: General Practice

## 2020-02-27 DIAGNOSIS — Z780 Asymptomatic menopausal state: Secondary | ICD-10-CM

## 2020-02-27 DIAGNOSIS — Z1231 Encounter for screening mammogram for malignant neoplasm of breast: Secondary | ICD-10-CM

## 2020-06-11 ENCOUNTER — Ambulatory Visit
Admission: RE | Admit: 2020-06-11 | Discharge: 2020-06-11 | Disposition: A | Discharge status: Home / Self Care | Payer: Medicare HMO | Source: Ambulatory Visit | Attending: General Practice | Admitting: General Practice

## 2020-06-11 ENCOUNTER — Other Ambulatory Visit: Payer: Self-pay

## 2020-06-11 DIAGNOSIS — Z1231 Encounter for screening mammogram for malignant neoplasm of breast: Secondary | ICD-10-CM

## 2020-06-11 DIAGNOSIS — Z780 Asymptomatic menopausal state: Secondary | ICD-10-CM

## 2021-02-09 ENCOUNTER — Other Ambulatory Visit (HOSPITAL_COMMUNITY): Payer: Self-pay | Admitting: Family Medicine

## 2021-02-09 DIAGNOSIS — I129 Hypertensive chronic kidney disease with stage 1 through stage 4 chronic kidney disease, or unspecified chronic kidney disease: Secondary | ICD-10-CM

## 2021-02-26 ENCOUNTER — Other Ambulatory Visit: Payer: Self-pay

## 2021-02-26 ENCOUNTER — Ambulatory Visit (HOSPITAL_COMMUNITY): Payer: Medicare Other | Attending: Internal Medicine

## 2021-02-26 DIAGNOSIS — I129 Hypertensive chronic kidney disease with stage 1 through stage 4 chronic kidney disease, or unspecified chronic kidney disease: Secondary | ICD-10-CM | POA: Insufficient documentation

## 2021-02-26 LAB — ECHOCARDIOGRAM COMPLETE
Area-P 1/2: 3.6 cm2
P 1/2 time: 792 msec
S' Lateral: 3 cm

## 2021-06-16 ENCOUNTER — Other Ambulatory Visit: Payer: Self-pay | Admitting: Family Medicine

## 2021-06-16 ENCOUNTER — Other Ambulatory Visit: Payer: Self-pay | Admitting: Internal Medicine

## 2021-06-16 DIAGNOSIS — Z1231 Encounter for screening mammogram for malignant neoplasm of breast: Secondary | ICD-10-CM

## 2021-07-20 ENCOUNTER — Ambulatory Visit
Admission: RE | Admit: 2021-07-20 | Discharge: 2021-07-20 | Disposition: A | Discharge status: Home / Self Care | Payer: Medicare Other | Source: Ambulatory Visit | Attending: Family Medicine | Admitting: Family Medicine

## 2021-07-20 DIAGNOSIS — Z1231 Encounter for screening mammogram for malignant neoplasm of breast: Secondary | ICD-10-CM

## 2021-11-22 ENCOUNTER — Other Ambulatory Visit: Payer: Self-pay | Admitting: Family Medicine

## 2022-01-21 ENCOUNTER — Other Ambulatory Visit: Payer: Self-pay | Admitting: Otolaryngology

## 2022-01-21 DIAGNOSIS — H918X9 Other specified hearing loss, unspecified ear: Secondary | ICD-10-CM

## 2022-02-04 ENCOUNTER — Ambulatory Visit
Admission: RE | Admit: 2022-02-04 | Discharge: 2022-02-04 | Disposition: A | Discharge status: Home / Self Care | Payer: Medicare Other | Source: Ambulatory Visit | Attending: Otolaryngology | Admitting: Otolaryngology

## 2022-02-04 DIAGNOSIS — H918X9 Other specified hearing loss, unspecified ear: Secondary | ICD-10-CM

## 2022-02-04 MED ORDER — GADOBENATE DIMEGLUMINE 529 MG/ML IV SOLN
20.0000 mL | Freq: Once | INTRAVENOUS | Status: AC | PRN
  Administered 2022-02-04: 20 mL via INTRAVENOUS

## 2022-06-06 ENCOUNTER — Emergency Department (HOSPITAL_BASED_OUTPATIENT_CLINIC_OR_DEPARTMENT_OTHER): Payer: Medicare Other | Admitting: Radiology

## 2022-06-06 ENCOUNTER — Emergency Department (HOSPITAL_BASED_OUTPATIENT_CLINIC_OR_DEPARTMENT_OTHER)
Admission: EM | Admit: 2022-06-06 | Discharge: 2022-06-06 | Disposition: A | Discharge status: Home / Self Care | Payer: Medicare Other | Attending: Emergency Medicine | Admitting: Emergency Medicine

## 2022-06-06 ENCOUNTER — Emergency Department (HOSPITAL_BASED_OUTPATIENT_CLINIC_OR_DEPARTMENT_OTHER): Payer: Medicare Other

## 2022-06-06 ENCOUNTER — Other Ambulatory Visit: Payer: Self-pay

## 2022-06-06 DIAGNOSIS — I2510 Atherosclerotic heart disease of native coronary artery without angina pectoris: Secondary | ICD-10-CM | POA: Diagnosis not present

## 2022-06-06 DIAGNOSIS — K8020 Calculus of gallbladder without cholecystitis without obstruction: Secondary | ICD-10-CM | POA: Insufficient documentation

## 2022-06-06 DIAGNOSIS — J90 Pleural effusion, not elsewhere classified: Secondary | ICD-10-CM | POA: Diagnosis not present

## 2022-06-06 DIAGNOSIS — N200 Calculus of kidney: Secondary | ICD-10-CM | POA: Insufficient documentation

## 2022-06-06 DIAGNOSIS — I700 Atherosclerosis of aorta: Secondary | ICD-10-CM | POA: Diagnosis not present

## 2022-06-06 DIAGNOSIS — S2241XA Multiple fractures of ribs, right side, initial encounter for closed fracture: Secondary | ICD-10-CM | POA: Diagnosis not present

## 2022-06-06 DIAGNOSIS — S8001XA Contusion of right knee, initial encounter: Secondary | ICD-10-CM | POA: Insufficient documentation

## 2022-06-06 DIAGNOSIS — Z7982 Long term (current) use of aspirin: Secondary | ICD-10-CM | POA: Diagnosis not present

## 2022-06-06 DIAGNOSIS — S8002XA Contusion of left knee, initial encounter: Secondary | ICD-10-CM | POA: Diagnosis not present

## 2022-06-06 DIAGNOSIS — R079 Chest pain, unspecified: Secondary | ICD-10-CM | POA: Diagnosis present

## 2022-06-06 DIAGNOSIS — Y92410 Unspecified street and highway as the place of occurrence of the external cause: Secondary | ICD-10-CM | POA: Diagnosis not present

## 2022-06-06 MED ORDER — TIZANIDINE HCL 4 MG PO TABS: 4.0000 mg | ORAL_TABLET | Freq: Four times a day (QID) | ORAL | 0 refills | Status: DC | PRN

## 2022-06-06 MED ORDER — HYDROCODONE-ACETAMINOPHEN 5-325 MG PO TABS: 1.0000 | ORAL_TABLET | Freq: Four times a day (QID) | ORAL | 0 refills | Status: DC | PRN

## 2022-06-06 MED ORDER — LIDOCAINE 5 % EX PTCH: 1.0000 | MEDICATED_PATCH | CUTANEOUS | 0 refills | Status: DC

## 2022-06-06 MED ORDER — IBUPROFEN 400 MG PO TABS
600.0000 mg | ORAL_TABLET | Freq: Once | ORAL | Status: AC
  Administered 2022-06-06: 600 mg via ORAL
  Filled 2022-06-06: qty 1
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
## 2022-06-06 NOTE — ED Notes (Signed)
RT note: Pt. given/instructed on use/importance of using I/S(Incentive Spirometry), stressed deep breaths/Cough/position changes to help avoid complications like as example, Pneumonia. Able to achieve 1000-1200cc X8 breaths.
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
## 2022-06-06 NOTE — Discharge Instructions (Addendum)
Please return to the ED with any new or worsening signs or symptoms Please be aware of the symptoms of pneumonia to include cough, fever, chest congestion Please follow-up with your PCP in the next 1 week Please take Tylenol/ibuprofen at home, low-dose muscle relaxer.  Please only take pain medication at night for breakthrough pain prior to bed.  Please utilize lidocaine patches.  If these are too expensive with insurance, you may purchase Salonpas patches. Please utilize incentive spirometer.  Please utilize splinting techniques that we have discussed. Please read attached guide concerning her fracture

## 2022-06-06 NOTE — ED Notes (Signed)
Patient transported to CT 

## 2022-06-20 ENCOUNTER — Encounter: Payer: Self-pay | Admitting: Family Medicine

## 2022-06-20 ENCOUNTER — Other Ambulatory Visit: Payer: Self-pay | Admitting: Family Medicine

## 2022-06-20 DIAGNOSIS — E2839 Other primary ovarian failure: Secondary | ICD-10-CM

## 2022-06-20 DIAGNOSIS — Z1231 Encounter for screening mammogram for malignant neoplasm of breast: Secondary | ICD-10-CM

## 2022-08-31 ENCOUNTER — Ambulatory Visit
Admission: RE | Admit: 2022-08-31 | Discharge: 2022-08-31 | Disposition: A | Discharge status: Home / Self Care | Payer: 59 | Source: Ambulatory Visit | Attending: Family Medicine | Admitting: Family Medicine

## 2022-08-31 DIAGNOSIS — Z1231 Encounter for screening mammogram for malignant neoplasm of breast: Secondary | ICD-10-CM

## 2022-11-12 ENCOUNTER — Emergency Department (HOSPITAL_COMMUNITY): Payer: 59

## 2022-11-12 ENCOUNTER — Inpatient Hospital Stay (HOSPITAL_COMMUNITY)
Admission: EM | Admit: 2022-11-12 | Discharge: 2022-11-19 | DRG: 299 | Disposition: A | Discharge status: Home / Self Care | Payer: 59 | Attending: Internal Medicine | Admitting: Internal Medicine

## 2022-11-12 ENCOUNTER — Other Ambulatory Visit: Payer: Self-pay

## 2022-11-12 ENCOUNTER — Encounter (HOSPITAL_COMMUNITY): Payer: Self-pay | Admitting: *Deleted

## 2022-11-12 ENCOUNTER — Encounter (HOSPITAL_COMMUNITY): Payer: Self-pay

## 2022-11-12 ENCOUNTER — Ambulatory Visit (HOSPITAL_COMMUNITY)
Admission: EM | Admit: 2022-11-12 | Discharge: 2022-11-12 | Disposition: A | Discharge status: Home / Self Care | Payer: 59

## 2022-11-12 DIAGNOSIS — E039 Hypothyroidism, unspecified: Secondary | ICD-10-CM | POA: Diagnosis present

## 2022-11-12 DIAGNOSIS — E785 Hyperlipidemia, unspecified: Secondary | ICD-10-CM | POA: Diagnosis present

## 2022-11-12 DIAGNOSIS — I480 Paroxysmal atrial fibrillation: Secondary | ICD-10-CM

## 2022-11-12 DIAGNOSIS — K219 Gastro-esophageal reflux disease without esophagitis: Secondary | ICD-10-CM | POA: Diagnosis present

## 2022-11-12 DIAGNOSIS — Z818 Family history of other mental and behavioral disorders: Secondary | ICD-10-CM

## 2022-11-12 DIAGNOSIS — R079 Chest pain, unspecified: Principal | ICD-10-CM

## 2022-11-12 DIAGNOSIS — F419 Anxiety disorder, unspecified: Secondary | ICD-10-CM | POA: Diagnosis present

## 2022-11-12 DIAGNOSIS — I161 Hypertensive emergency: Secondary | ICD-10-CM | POA: Diagnosis present

## 2022-11-12 DIAGNOSIS — Z7189 Other specified counseling: Secondary | ICD-10-CM | POA: Diagnosis not present

## 2022-11-12 DIAGNOSIS — I4891 Unspecified atrial fibrillation: Secondary | ICD-10-CM | POA: Diagnosis not present

## 2022-11-12 DIAGNOSIS — H543 Unqualified visual loss, both eyes: Secondary | ICD-10-CM | POA: Diagnosis present

## 2022-11-12 DIAGNOSIS — Z888 Allergy status to other drugs, medicaments and biological substances status: Secondary | ICD-10-CM

## 2022-11-12 DIAGNOSIS — I739 Peripheral vascular disease, unspecified: Secondary | ICD-10-CM | POA: Diagnosis present

## 2022-11-12 DIAGNOSIS — Z515 Encounter for palliative care: Secondary | ICD-10-CM | POA: Diagnosis not present

## 2022-11-12 DIAGNOSIS — I2510 Atherosclerotic heart disease of native coronary artery without angina pectoris: Secondary | ICD-10-CM | POA: Diagnosis present

## 2022-11-12 DIAGNOSIS — H409 Unspecified glaucoma: Secondary | ICD-10-CM | POA: Diagnosis present

## 2022-11-12 DIAGNOSIS — Z6841 Body Mass Index (BMI) 40.0 and over, adult: Secondary | ICD-10-CM | POA: Diagnosis not present

## 2022-11-12 DIAGNOSIS — M1990 Unspecified osteoarthritis, unspecified site: Secondary | ICD-10-CM | POA: Diagnosis present

## 2022-11-12 DIAGNOSIS — I959 Hypotension, unspecified: Secondary | ICD-10-CM | POA: Diagnosis not present

## 2022-11-12 DIAGNOSIS — E871 Hypo-osmolality and hyponatremia: Secondary | ICD-10-CM | POA: Diagnosis present

## 2022-11-12 DIAGNOSIS — J9601 Acute respiratory failure with hypoxia: Secondary | ICD-10-CM | POA: Diagnosis present

## 2022-11-12 DIAGNOSIS — F32A Depression, unspecified: Secondary | ICD-10-CM | POA: Diagnosis present

## 2022-11-12 DIAGNOSIS — Z8249 Family history of ischemic heart disease and other diseases of the circulatory system: Secondary | ICD-10-CM

## 2022-11-12 DIAGNOSIS — Z79890 Hormone replacement therapy (postmenopausal): Secondary | ICD-10-CM

## 2022-11-12 DIAGNOSIS — I7100 Dissection of unspecified site of aorta: Secondary | ICD-10-CM | POA: Diagnosis present

## 2022-11-12 DIAGNOSIS — Z96653 Presence of artificial knee joint, bilateral: Secondary | ICD-10-CM | POA: Diagnosis present

## 2022-11-12 DIAGNOSIS — Z96641 Presence of right artificial hip joint: Secondary | ICD-10-CM | POA: Diagnosis present

## 2022-11-12 DIAGNOSIS — I513 Intracardiac thrombosis, not elsewhere classified: Secondary | ICD-10-CM | POA: Diagnosis present

## 2022-11-12 DIAGNOSIS — Z79899 Other long term (current) drug therapy: Secondary | ICD-10-CM

## 2022-11-12 DIAGNOSIS — I7102 Dissection of abdominal aorta: Principal | ICD-10-CM | POA: Diagnosis present

## 2022-11-12 DIAGNOSIS — Z90710 Acquired absence of both cervix and uterus: Secondary | ICD-10-CM

## 2022-11-12 DIAGNOSIS — M25561 Pain in right knee: Secondary | ICD-10-CM | POA: Diagnosis present

## 2022-11-12 DIAGNOSIS — Z66 Do not resuscitate: Secondary | ICD-10-CM | POA: Diagnosis present

## 2022-11-12 DIAGNOSIS — I7101 Dissection of ascending aorta: Secondary | ICD-10-CM

## 2022-11-12 DIAGNOSIS — Z8614 Personal history of Methicillin resistant Staphylococcus aureus infection: Secondary | ICD-10-CM

## 2022-11-12 DIAGNOSIS — Z7982 Long term (current) use of aspirin: Secondary | ICD-10-CM

## 2022-11-12 DIAGNOSIS — Z9841 Cataract extraction status, right eye: Secondary | ICD-10-CM

## 2022-11-12 DIAGNOSIS — Z86718 Personal history of other venous thrombosis and embolism: Secondary | ICD-10-CM

## 2022-11-12 DIAGNOSIS — I10 Essential (primary) hypertension: Secondary | ICD-10-CM | POA: Diagnosis present

## 2022-11-12 DIAGNOSIS — Z9884 Bariatric surgery status: Secondary | ICD-10-CM

## 2022-11-12 DIAGNOSIS — I71019 Dissection of thoracic aorta, unspecified: Secondary | ICD-10-CM | POA: Diagnosis not present

## 2022-11-12 LAB — TROPONIN I (HIGH SENSITIVITY)
Troponin I (High Sensitivity): 4 ng/L (ref <18)
Troponin I (High Sensitivity): 4 ng/L (ref <18)

## 2022-11-12 LAB — CBC
HCT: 46.2 % — ABNORMAL HIGH (ref 36.0–46.0)
Hemoglobin: 14.6 g/dL (ref 12.0–15.0)
MCH: 30.9 pg (ref 26.0–34.0)
MCHC: 31.6 g/dL (ref 30.0–36.0)
MCV: 97.7 fL (ref 80.0–100.0)
Platelets: 216 10*3/uL (ref 150–400)
RBC: 4.73 MIL/uL (ref 3.87–5.11)
RDW: 12.3 % (ref 11.5–15.5)
WBC: 13.1 10*3/uL — ABNORMAL HIGH (ref 4.0–10.5)
nRBC: 0 % (ref 0.0–0.2)

## 2022-11-12 LAB — BASIC METABOLIC PANEL
Anion gap: 13 (ref 5–15)
BUN: 25 mg/dL — ABNORMAL HIGH (ref 8–23)
CO2: 20 mmol/L — ABNORMAL LOW (ref 22–32)
Calcium: 9.1 mg/dL (ref 8.9–10.3)
Chloride: 103 mmol/L (ref 98–111)
Creatinine, Ser: 1.17 mg/dL — ABNORMAL HIGH (ref 0.44–1.00)
GFR, Estimated: 51 mL/min — ABNORMAL LOW (ref >60)
Glucose, Bld: 142 mg/dL — ABNORMAL HIGH (ref 70–99)
Potassium: 4.6 mmol/L (ref 3.5–5.1)
Sodium: 136 mmol/L (ref 135–145)

## 2022-11-12 LAB — D-DIMER, QUANTITATIVE: D-Dimer, Quant: 1.67 ug/mL-FEU — ABNORMAL HIGH (ref 0.00–0.50)

## 2022-11-12 MED ORDER — OXYCODONE HCL 5 MG PO TABS
5.0000 mg | ORAL_TABLET | ORAL | Status: DC | PRN
  Administered 2022-11-12 – 2022-11-17 (×11): 5 mg via ORAL
  Filled 2022-11-12 (×11): qty 1

## 2022-11-12 MED ORDER — IOHEXOL 350 MG/ML SOLN
75.0000 mL | Freq: Once | INTRAVENOUS | Status: AC | PRN
  Administered 2022-11-12: 75 mL via INTRAVENOUS

## 2022-11-12 MED ORDER — FENTANYL CITRATE PF 50 MCG/ML IJ SOSY
50.0000 ug | PREFILLED_SYRINGE | Freq: Once | INTRAMUSCULAR | Status: AC
  Administered 2022-11-12: 50 ug via INTRAVENOUS
  Filled 2022-11-12: qty 1

## 2022-11-12 MED ORDER — POLYETHYLENE GLYCOL 3350 17 G PO PACK
17.0000 g | PACK | Freq: Every day | ORAL | Status: DC | PRN
  Administered 2022-11-15 – 2022-11-19 (×4): 17 g via ORAL
  Filled 2022-11-12 (×4): qty 1

## 2022-11-12 MED ORDER — ASPIRIN 325 MG PO TABS: 325.0000 mg | ORAL_TABLET | Freq: Every day | ORAL | Status: DC

## 2022-11-12 MED ORDER — DOCUSATE SODIUM 100 MG PO CAPS
100.0000 mg | ORAL_CAPSULE | Freq: Two times a day (BID) | ORAL | Status: DC | PRN
  Administered 2022-11-15 – 2022-11-19 (×5): 100 mg via ORAL
  Filled 2022-11-12 (×5): qty 1

## 2022-11-12 MED ORDER — NICARDIPINE HCL IN NACL 40-0.83 MG/200ML-% IV SOLN
3.0000 mg/h | INTRAVENOUS | Status: DC
  Administered 2022-11-13: 5 mg/h via INTRAVENOUS
  Administered 2022-11-13: 3 mg/h via INTRAVENOUS
  Filled 2022-11-12 (×2): qty 200

## 2022-11-12 MED ORDER — ROSUVASTATIN CALCIUM 5 MG PO TABS
5.0000 mg | ORAL_TABLET | Freq: Every evening | ORAL | Status: DC
  Administered 2022-11-13: 5 mg via ORAL
  Filled 2022-11-12: qty 1

## 2022-11-12 MED ORDER — MORPHINE SULFATE (PF) 4 MG/ML IV SOLN
4.0000 mg | Freq: Once | INTRAVENOUS | Status: AC
  Administered 2022-11-12: 4 mg via INTRAVENOUS
  Filled 2022-11-12: qty 1

## 2022-11-12 MED ORDER — LEVOTHYROXINE SODIUM 88 MCG PO TABS
88.0000 ug | ORAL_TABLET | Freq: Every day | ORAL | Status: DC
  Administered 2022-11-13 – 2022-11-19 (×7): 88 ug via ORAL
  Filled 2022-11-12 (×7): qty 1

## 2022-11-12 MED ORDER — NICARDIPINE HCL IN NACL 20-0.86 MG/200ML-% IV SOLN
3.0000 mg/h | INTRAVENOUS | Status: DC
  Administered 2022-11-12: 12.5 mg/h via INTRAVENOUS
  Administered 2022-11-12: 5 mg/h via INTRAVENOUS
  Administered 2022-11-12: 7.5 mg/h via INTRAVENOUS
  Filled 2022-11-12 (×3): qty 200
  Filled 2022-11-12: qty 400

## 2022-11-12 MED ORDER — FLUOXETINE HCL 20 MG PO CAPS
20.0000 mg | ORAL_CAPSULE | Freq: Every day | ORAL | Status: DC
  Administered 2022-11-13: 20 mg via ORAL
  Filled 2022-11-12: qty 1

## 2022-11-12 MED ORDER — CHLORHEXIDINE GLUCONATE CLOTH 2 % EX PADS
6.0000 | MEDICATED_PAD | Freq: Every day | CUTANEOUS | Status: DC
  Administered 2022-11-12 – 2022-11-16 (×5): 6 via TOPICAL

## 2022-11-12 MED ORDER — HEPARIN SODIUM (PORCINE) 5000 UNIT/ML IJ SOLN
5000.0000 [IU] | Freq: Three times a day (TID) | INTRAMUSCULAR | Status: DC
  Administered 2022-11-12 – 2022-11-19 (×20): 5000 [IU] via SUBCUTANEOUS
  Filled 2022-11-12 (×20): qty 1

## 2022-11-12 MED ORDER — LATANOPROST 0.005 % OP SOLN
1.0000 [drp] | Freq: Every day | OPHTHALMIC | Status: DC
  Administered 2022-11-12 – 2022-11-18 (×7): 1 [drp] via OPHTHALMIC
  Filled 2022-11-12: qty 2.5

## 2022-11-12 MED ORDER — IRBESARTAN 300 MG PO TABS: 150.0000 mg | ORAL_TABLET | Freq: Every day | ORAL | Status: DC

## 2022-11-12 MED ORDER — ONDANSETRON HCL 4 MG/2ML IJ SOLN
4.0000 mg | Freq: Once | INTRAMUSCULAR | Status: AC | PRN
  Administered 2022-11-12: 4 mg via INTRAVENOUS
  Filled 2022-11-12: qty 2

## 2022-11-12 MED ORDER — IRBESARTAN 150 MG PO TABS
150.0000 mg | ORAL_TABLET | Freq: Every day | ORAL | Status: DC
  Administered 2022-11-12 – 2022-11-14 (×3): 150 mg via ORAL
  Filled 2022-11-12 (×3): qty 0.5

## 2022-11-12 NOTE — H&P (Signed)
NAME:  Lauren Clark, MRN:  [id number], DOB:  [birth year], LOS: 0 ADMISSION DATE:  11/12/2022, CONSULTATION DATE:  11/12/2022 REFERRING MD:  EDP, CHIEF COMPLAINT:  chest pain   History of Present Illness:  Lauren Clark is a 67 year old woman with a past medical history of hypothyroidism, hypertension, and hyperlipidemia who presents with chest pain of acute onset earlier this morning.  After waking up from her sleep around 6 AM she developed sudden onset of sharp stabbing central chest pain which radiated to her back.  Not accompanied by shortness of breath, cough, fevers chills, wheezing.  She has never had anything like this before.  She did not take anything at home for the symptoms.  Upon arrival to the ED for worsening symptoms she had workup that ruled out myocardial infarction.  D-dimer was elevated so CT PE study was obtained.  This showed no pulmonary embolism but did show intramural thrombus of the ascending aorta versus type aortic dissection.  She was found to be hypertensive.  Her chest pain has gotten a little better with blood pressure control.  CT surgery was consulted and recommended admission to the ICU and medical management of blood pressure.  PCCM asked to admit.  Pertinent  Medical History  Hypertension Hyperlipidemia Hypothyroidism She is a lifelong never smoker and nondrinker  Significant Hospital Events: Including procedures, antibiotic start and stop dates in addition to other pertinent events     Interim History / Subjective:    Objective   Blood pressure (!) 128/59, pulse 63, temperature 98.8 F (37.1 C), temperature source Oral, resp. rate 15, height 4\' 11"  (1.499 m), weight 91.6 kg, SpO2 90 %.       No intake or output data in the 24 hours ending 11/12/22 1908 Filed Weights   11/12/22 1259  Weight: 91.6 kg    Examination: General: Elderly, lying flat comfortably, no distress HENT: mmm Lungs: Breathing is nonlabored, no wheezes or crackles Cardiovascular:  Regular rate and rhythm Abdomen: Obese, soft, nontender Extremities: No lower extremity edema, venous varicosities Neuro: Normal speech, no focal asymmetry, moves all 4 extremities  Labs reviewed Na 136 K 4.6 C 1.17 WBC 13.1 Hgb 14.6 Ddimer 1.67  Reviewed CTPE study personally which shows intramural hematoma vs Type A dissection. Study negative for PE. Lung parenchyma largely unremarkable.  Resolved Hospital Problem list     Assessment & Plan:   Type 1 Aortic Dissection vs Intramural Hematoma Hypertensive Emergency -Control blood pressure with nicardipine.  Goal systolic blood pressure is less than 110 -I will resume her home medications which she has not taken today.  She usually takes her meds at night -Pain control as needed  Hyperlipidemia Continue statin  Hypothyroidism Continue Synthroid  Depression Continue Zoloft    Best Practice (right click and "Reselect all SmartList Selections" daily)   Diet/type: Regular consistency (see orders) DVT prophylaxis: prophylactic heparin  GI prophylaxis: N/A Lines: N/A Foley:  N/A Code Status:  full code Last date of multidisciplinary goals of care discussion []   Labs   CBC: Recent Labs  Lab 11/12/22 1301  WBC 13.1*  HGB 14.6  HCT 46.2*  MCV 97.7  PLT 216    Basic Metabolic Panel: Recent Labs  Lab 11/12/22 1301  NA 136  K 4.6  CL 103  CO2 20*  GLUCOSE 142*  BUN 25*  CREATININE 1.17*  CALCIUM 9.1   GFR: Estimated Creatinine Clearance: 46.1 mL/min (A) (by C-G formula based on SCr of 1.17 mg/dL (H)). Recent  Labs  Lab 11/12/22 1301  WBC 13.1*    Liver Function Tests: No results for input(s): "AST", "ALT", "ALKPHOS", "BILITOT", "PROT", "ALBUMIN" in the last 168 hours. No results for input(s): "LIPASE", "AMYLASE" in the last 168 hours. No results for input(s): "AMMONIA" in the last 168 hours.  ABG No results found for: "PHART", "PCO2ART", "PO2ART", "HCO3", "TCO2", "ACIDBASEDEF", "O2SAT"    Coagulation Profile: No results for input(s): "INR", "PROTIME" in the last 168 hours.  Cardiac Enzymes: No results for input(s): "CKTOTAL", "CKMB", "CKMBINDEX", "TROPONINI" in the last 168 hours.  HbA1C: Hgb A1c MFr Bld  Date/Time Value Ref Range Status  06/08/2016 10:37 AM 5.4 4.8 - 5.6 % Final    Comment:    (NOTE)         Pre-diabetes: 5.7 - 6.4         Diabetes: >6.4         Glycemic control for adults with diabetes: <7.0     CBG: No results for input(s): "GLUCAP" in the last 168 hours.  Review of Systems:   As reviewed in HPI  Past Medical History:  She,  has a past medical history of Anemia, Anxiety, Arthritis, Depression, GERD (gastroesophageal reflux disease), Glaucoma, History of blood transfusion, History of MRSA infection, Hyperlipidemia, Hypertension, Hypothyroidism, Impaired vision in both eyes, Morbid obesity (HCC), Peripheral vascular disease (HCC), and Pre-diabetes.   Surgical History:   Past Surgical History:  Procedure Laterality Date   ABDOMINAL HYSTERECTOMY  1981   with bilateral BSO   CATARACT EXTRACTION Right 2010   [facility]   COLONOSCOPY     DEBRIDEMENT AND CLOSURE WOUND Right 07/07/2014   Dr. Loralie Champagne   EYE SURGERY     bil cataraction with IOL as a child, cataract surgery late 1990- right   JOINT REPLACEMENT Right 2016   knee   KNEE DEBRIDEMENT Right 06/22/2014   Dr. Linda Hedges with wound vac   KNEE SURGERY     i&d KNEE POST REPLACEMENT   LAPAROSCOPIC GASTRIC SLEEVE RESECTION N/A 02/29/2016   Procedure: LAPAROSCOPIC GASTRIC SLEEVE RESECTION, UPPER ENDO;  Surgeon: Gaynelle Adu, MD;  Location: [facility];  Service: General;  Laterality: N/A;   TONSILLECTOMY     TOTAL HIP ARTHROPLASTY Right 06/14/2016   Procedure: RIGHT TOTAL HIP ARTHROPLASTY ANTERIOR APPROACH;  Surgeon: Durene Romans, MD;  Location: [facility];  Service: Orthopedics;  Laterality: Right;   TOTAL KNEE ARTHROPLASTY Left 09/05/2017   Procedure: LEFT TOTAL KNEE  ARTHROPLASTY;  Surgeon: Durene Romans, MD;  Location: [facility];  Service: Orthopedics;  Laterality: Left;  Adductor Block   UPPER GI ENDOSCOPY  01/25/2016     Social History:   reports that she has never smoked. She has never used smokeless tobacco. She reports that she does not drink alcohol and does not use drugs.   Family History:  Her family history includes Depression in her mother; Hypertension in her father. There is no history of Breast cancer.   Allergies Allergies  Allergen Reactions   Crestor [Rosuvastatin Calcium] Other (See Comments)    Muscle aches     Lipitor [Atorvastatin] Other (See Comments)    Muscle aches    Hctz [Hydrochlorothiazide] Other (See Comments)    Pt doesn't remember     Home Medications  Prior to Admission medications   Medication Sig Start Date End Date Taking? Authorizing Provider  amoxicillin (AMOXIL) 875 MG tablet  07/11/19   [provider]  aspirin 325 MG tablet Take 325 mg by mouth daily.  [provider]  calcium citrate-vitamin D (CELEBRATE CALCIUM CITRATE) 500-500 MG-UNIT chewable tablet Chew 1 tablet by mouth 3 (three) times daily.    [provider]  Cholecalciferol (VITAMIN D3) 20 MCG (800 UNIT) TABS Take 62.5 mg by mouth.    [provider]  Cyanocobalamin (VITAMIN B-12) 2500 MCG SUBL Place 5,000 mcg under the tongue daily at 12 noon.    [provider]  DILT-XR 180 MG 24 hr capsule Take 180 mg by mouth daily.  08/09/17   [provider]  docusate sodium (COLACE) 100 MG capsule Take 1 capsule (100 mg total) by mouth 2 (two) times daily. 09/05/17   Lanney Gins, PA-C  ferrous sulfate (FERROUSUL) 325 (65 FE) MG tablet Take 1 tablet (325 mg total) by mouth 3 (three) times daily with meals. 09/05/17   Lanney Gins, PA-C  FLUoxetine (PROZAC) 20 MG capsule  07/10/19   [provider]  HYDROcodone-acetaminophen (NORCO/VICODIN) 5-325 MG tablet Take 1 tablet by mouth every 6 (six) hours  as needed for severe pain. 06/06/22   Al Decant, PA-C  latanoprost (XALATAN) 0.005 % ophthalmic solution Place 1 drop into the right eye at bedtime.     [provider]  levothyroxine (SYNTHROID, LEVOTHROID) 88 MCG tablet Take 88 mcg by mouth daily before breakfast.     [provider]  lidocaine (LIDODERM) 5 % Place 1 patch onto the skin daily. Remove & Discard patch within 12 hours or as directed by MD 06/06/22   Al Decant, PA-C  Magnesium 250 MG TABS Take 500-750 mg by mouth daily. Varies based on leg cramping.    [provider]  methocarbamol (ROBAXIN) 500 MG tablet Take 1 tablet (500 mg total) by mouth every 6 (six) hours as needed for muscle spasms. 09/05/17   Lanney Gins, PA-C  milk thistle 175 MG tablet Take 175 mg by mouth daily. Take 3 daily    [provider]  Multiple Vitamins-Minerals (CELEBRATE MULTI-COMPLETE 18) CHEW Chew 2 tablets by mouth daily at 12 noon.    [provider]  Omega-3 Fatty Acids (EQL OMEGA 3 FISH OIL) 1000 MG CAPS Take by mouth.    [provider]  phentermine 37.5 MG capsule Take 37.5 mg by mouth every morning. 1/2 tab daily    [provider]  polyethylene glycol (MIRALAX / GLYCOLAX) packet Take 17 g by mouth 2 (two) times daily. 09/05/17   Lanney Gins, PA-C  rosuvastatin (CRESTOR) 5 MG tablet Take 5 mg by mouth every evening.     [provider]  telmisartan (MICARDIS) 40 MG tablet Take 40 mg by mouth daily.    [provider]  tiZANidine (ZANAFLEX) 4 MG tablet Take 1 tablet (4 mg total) by mouth every 6 (six) hours as needed for muscle spasms. 06/06/22   Al Decant, PA-C  topiramate (TOPAMAX) 25 MG tablet  08/08/19   [provider]  zinc gluconate 50 MG tablet Take 50 mg by mouth daily.    [provider]     Critical care time:      The patient is critically ill due to aortic dissection.  Critical care was necessary to treat  or prevent imminent or life-threatening deterioration.  Critical care was time spent personally by me on the following activities: development of treatment plan with patient and/or surrogate as well as nursing, discussions with consultants, evaluation of patient's response to treatment, examination of patient, obtaining history from patient or surrogate, ordering and  performing treatments and interventions, ordering and review of laboratory studies, ordering and review of radiographic studies, pulse oximetry, re-evaluation of patient's condition and participation in multidisciplinary rounds.   Critical Care Time devoted to patient care services described in this note is 45 minutes. This time reflects time of care of this signee Charlott Holler . This critical care time does not reflect separately billable procedures or procedure time, teaching time or supervisory time of PA/NP/Med student/Med Resident etc but could involve care discussion time.       Charlott Holler [facility] 11/12/2022 8:01 PM  Pager: see AMION  If no response to pager , please call critical care on call (see AMION) until 7pm After 7:00 pm call Elink

## 2022-11-12 NOTE — ED Triage Notes (Signed)
Reports had pain in her chest and shoulder blade this morning and took an asa.  Reports increased sob with exertion.

## 2022-11-12 NOTE — ED Triage Notes (Signed)
Pt reports mid CP that radiates to Lt shoulder and uper Lt back. Pt reports she was awake this morning at 0600 when the CP started. Pt reports she was getting ready to chant the patient has not had this type of pain before. Pt took a reg. ASA this morning that reduced the pain.

## 2022-11-12 NOTE — ED Notes (Signed)
Pt in bed, pt denies any significant improvement in her pain, states that her pain is better when she doesn't move.

## 2022-11-12 NOTE — Progress Notes (Signed)
eLink Physician-Brief Progress Note Patient Name: Lauren Clark DOB: Nov 18, 1955 MRN: [id number]   Date of Service  11/12/2022  HPI/Events of Note  46 F hx of hypertension, dyslipidemia, hypothyroidism presented with sudden onset CP upon waking up. Workup revealed intramural thrombus of the ascending aorta vs dissection. CT surgery consulted, recommend medical management  Seen chest pain free BP 112/54  Hr 58 on nicardipine drip  eICU Interventions  Goal BP 10-120  HR < 60 NPO except meds for now as may need surgical intervention        Darl Pikes 11/12/2022, 10:11 PM

## 2022-11-12 NOTE — ED Provider Notes (Signed)
Accepted handoff at shift change from [facility] [place] [facility], [place]. Please see prior provider note for more detail.   Briefly: Patient is 67 y.o. with past medical history significant for hypertension, hyperlipidemia, prediabetes, obesity who presents with concern for left-sided chest pain and shoulder blade starting this morning.   DDX: concern for ACS, PE, aortic dissection, pneumonia or pneumothorax  Plan: Follow-up on D-dimer, if elevated will order CTA of the chest to evaluate further for possible PE   Physical Exam  BP (!) 136/57   Pulse 63   Temp 98.8 F (37.1 C) (Oral)   Resp 14   Ht 4\' 11"  (1.499 m)   Wt 91.6 kg   SpO2 91%   BMI 40.80 kg/m   Physical Exam  Procedures  Procedures  ED Course / MDM   Clinical Course as of 11/12/22 1858  Sat Nov 12, 2022  1854 CTA chest revealed concern for ascending aortic intramural thrombus versus dissection.  Was negative for PE.  Reached out to Dr. Lavinia Sharps of cardiothoracic surgery who advised given his interpretation of the scans that is likely intramural thrombus.  Advised to admit for blood pressure control.  Did not think at this time it warrants emergent surgical intervention.  Advised systolic blood pressure goal of less than 110.  Started on Cardene drip.  Admitted to ICU. Dr. Marchelle Gearing is admitting attending. [JR]    Clinical Course User Index [JR] Gareth Eagle, PA-C   Medical Decision Making Amount and/or Complexity of Data Reviewed Labs: ordered. Radiology: ordered.  Risk Prescription drug management. Decision regarding hospitalization.          Gareth Eagle, PA-C 11/12/22 1859    Gerhard Munch, MD 11/14/22 0010

## 2022-11-12 NOTE — ED Notes (Signed)
Pt in bed, pt reports a decrease in pain, states that her pain is still an 8/10, pt requests more pain med,

## 2022-11-12 NOTE — ED Provider Notes (Signed)
Patient presents to clinic for central chest pain that radiates to her left shoulder and her left mid back.  She woke up around 6:00 this morning, noticed the chest pain when she started to pray.  She did take an aspirin and this has helped.  Reports she no longer has central chest pain, has pain in between her shoulder blades.  Her oxygen is 91% on room air.  Her EKG shows normal sinus rhythm, without ST elevation or ST depression.  Her comorbidities include hyperlipidemia, hypertension peripheral vascular disease, and a history of a DVT.  She is alert and oriented.  Offered to call EMS transport down to the emergency department, patient declined and will transfer herself via personal vehicle.  Advised to head to the nearest emergency department for further workup and evaluation.  Patient in agreement with plan.   Krystle Oberman, [place], [place] 11/12/22 1216

## 2022-11-12 NOTE — ED Provider Notes (Signed)
[place] EMERGENCY DEPARTMENT AT [facility] Provider Note   CSN: [id number] Arrival date & time: 11/12/22  1225     History {Add pertinent medical, surgical, social history, OB history to HPI:1} Chief Complaint  Patient presents with   Chest Pain    Lauren Clark is a 67 y.o. female with past medical history significant for hypertension, hyperlipidemia, prediabetes, obesity who presents with concern for left-sided chest pain and shoulder blade starting this morning.  Patient reports that she took an aspirin secondary to the symptoms.  Patient reports worsening shortness of breath with exertion.  Patient denies any previous history of ACS, strong family history of ACS, she does not smoke cigarettes, she has not had a previous stroke.  Patient describes the pain as sharp.  She denies pleuritic pain.  She reports that she does take some estrogen supplementation secondary to hysterectomy/menopause.  She denies any nausea, vomiting.   Chest Pain      Home Medications Prior to Admission medications   Medication Sig Start Date End Date Taking? Authorizing Provider  amoxicillin (AMOXIL) 875 MG tablet  07/11/19   [provider]  aspirin 325 MG tablet Take 325 mg by mouth daily.    [provider]  calcium citrate-vitamin D (CELEBRATE CALCIUM CITRATE) 500-500 MG-UNIT chewable tablet Chew 1 tablet by mouth 3 (three) times daily.    [provider]  Cholecalciferol (VITAMIN D3) 20 MCG (800 UNIT) TABS Take 62.5 mg by mouth.    [provider]  Cyanocobalamin (VITAMIN B-12) 2500 MCG SUBL Place 5,000 mcg under the tongue daily at 12 noon.    [provider]  DILT-XR 180 MG 24 hr capsule Take 180 mg by mouth daily.  08/09/17   [provider]  docusate sodium (COLACE) 100 MG capsule Take 1 capsule (100 mg total) by mouth 2 (two) times daily. 09/05/17   Lanney Gins, PA-C  ferrous sulfate (FERROUSUL) 325 (65 FE) MG tablet Take 1 tablet  (325 mg total) by mouth 3 (three) times daily with meals. 09/05/17   Lanney Gins, PA-C  FLUoxetine (PROZAC) 20 MG capsule  07/10/19   [provider]  HYDROcodone-acetaminophen (NORCO/VICODIN) 5-325 MG tablet Take 1 tablet by mouth every 6 (six) hours as needed for severe pain. 06/06/22   Al Decant, PA-C  latanoprost (XALATAN) 0.005 % ophthalmic solution Place 1 drop into the right eye at bedtime.     [provider]  levothyroxine (SYNTHROID, LEVOTHROID) 88 MCG tablet Take 88 mcg by mouth daily before breakfast.     [provider]  lidocaine (LIDODERM) 5 % Place 1 patch onto the skin daily. Remove & Discard patch within 12 hours or as directed by MD 06/06/22   Al Decant, PA-C  Magnesium 250 MG TABS Take 500-750 mg by mouth daily. Varies based on leg cramping.    [provider]  methocarbamol (ROBAXIN) 500 MG tablet Take 1 tablet (500 mg total) by mouth every 6 (six) hours as needed for muscle spasms. 09/05/17   Lanney Gins, PA-C  milk thistle 175 MG tablet Take 175 mg by mouth daily. Take 3 daily    [provider]  Multiple Vitamins-Minerals (CELEBRATE MULTI-COMPLETE 18) CHEW Chew 2 tablets by mouth daily at 12 noon.    [provider]  Omega-3 Fatty Acids (EQL OMEGA 3 FISH OIL) 1000 MG CAPS Take by mouth.    [provider]  phentermine 37.5 MG capsule Take 37.5 mg by mouth every  morning. 1/2 tab daily    [provider]  polyethylene glycol (MIRALAX / GLYCOLAX) packet Take 17 g by mouth 2 (two) times daily. 09/05/17   Lanney Gins, PA-C  rosuvastatin (CRESTOR) 5 MG tablet Take 5 mg by mouth every evening.     [provider]  telmisartan (MICARDIS) 40 MG tablet Take 40 mg by mouth daily.    [provider]  tiZANidine (ZANAFLEX) 4 MG tablet Take 1 tablet (4 mg total) by mouth every 6 (six) hours as needed for muscle spasms. 06/06/22   Al Decant, PA-C  topiramate (TOPAMAX)  25 MG tablet  08/08/19   [provider]  zinc gluconate 50 MG tablet Take 50 mg by mouth daily.    [provider]      Allergies    Crestor [rosuvastatin calcium], Lipitor [atorvastatin], and Hctz [hydrochlorothiazide]    Review of Systems   Review of Systems  Cardiovascular:  Positive for chest pain.    Physical Exam Updated Vital Signs BP 138/75   Pulse 66   Temp 97.9 F (36.6 C)   Resp 20   Ht 4\' 11"  (1.499 m)   Wt 91.6 kg   SpO2 95%   BMI 40.80 kg/m  Physical Exam  ED Results / Procedures / Treatments   Labs (all labs ordered are listed, but only abnormal results are displayed) Labs Reviewed  CBC - Abnormal; Notable for the following components:      Result Value   WBC 13.1 (*)    HCT 46.2 (*)    All other components within normal limits  BASIC METABOLIC PANEL  TROPONIN I (HIGH SENSITIVITY)    EKG EKG Interpretation  Date/Time:  Saturday Nov 12 2022 12:33:19 EDT Ventricular Rate:  60 PR Interval:  210 QRS Duration: 74 QT Interval:  408 QTC Calculation: 408 R Axis:   107 Text Interpretation: Sinus rhythm with 1st degree A-V block with Premature atrial complexes Rightward axis Cannot rule out Anterior infarct , age undetermined Abnormal ECG No significant change since last tracing Confirmed by Melene Plan [phone]) on 11/12/2022 1:18:59 PM  Radiology No results found.  Procedures Procedures  {Document cardiac monitor, telemetry assessment procedure when appropriate:1}  Medications Ordered in ED Medications - No data to display  ED Course/ Medical Decision Making/ A&P   {   Click here for ABCD2, HEART and other calculatorsREFRESH Note before signing :1}                          Medical Decision Making Amount and/or Complexity of Data Reviewed Labs: ordered. Radiology: ordered.   ***  {Document critical care time when appropriate:1} {Document review of labs and clinical decision tools ie heart score, Chads2Vasc2 etc:1}   {Document your independent review of radiology images, and any outside records:1} {Document your discussion with family members, caretakers, and with consultants:1} {Document social determinants of health affecting pt's care:1} {Document your decision making why or why not admission, treatments were needed:1} Final Clinical Impression(s) / ED Diagnoses Final diagnoses:  None    Rx / DC Orders ED Discharge Orders     None

## 2022-11-13 DIAGNOSIS — I10 Essential (primary) hypertension: Secondary | ICD-10-CM

## 2022-11-13 DIAGNOSIS — I7101 Dissection of ascending aorta: Secondary | ICD-10-CM | POA: Diagnosis not present

## 2022-11-13 LAB — CBC
HCT: 41.8 % (ref 36.0–46.0)
Hemoglobin: 13.5 g/dL (ref 12.0–15.0)
MCH: 31.3 pg (ref 26.0–34.0)
MCHC: 32.3 g/dL (ref 30.0–36.0)
MCV: 96.8 fL (ref 80.0–100.0)
Platelets: 164 10*3/uL (ref 150–400)
RBC: 4.32 MIL/uL (ref 3.87–5.11)
RDW: 12.3 % (ref 11.5–15.5)
WBC: 9.6 10*3/uL (ref 4.0–10.5)
nRBC: 0 % (ref 0.0–0.2)

## 2022-11-13 LAB — PHOSPHORUS: Phosphorus: 3.5 mg/dL (ref 2.5–4.6)

## 2022-11-13 LAB — HIV ANTIBODY (ROUTINE TESTING W REFLEX): HIV Screen 4th Generation wRfx: NONREACTIVE

## 2022-11-13 LAB — BASIC METABOLIC PANEL
Anion gap: 10 (ref 5–15)
BUN: 15 mg/dL (ref 8–23)
CO2: 23 mmol/L (ref 22–32)
Calcium: 8.5 mg/dL — ABNORMAL LOW (ref 8.9–10.3)
Chloride: 102 mmol/L (ref 98–111)
Creatinine, Ser: 0.97 mg/dL (ref 0.44–1.00)
GFR, Estimated: >60 mL/min (ref >60)
Glucose, Bld: 131 mg/dL — ABNORMAL HIGH (ref 70–99)
Potassium: 4.5 mmol/L (ref 3.5–5.1)
Sodium: 135 mmol/L (ref 135–145)

## 2022-11-13 LAB — MRSA NEXT GEN BY PCR, NASAL: MRSA by PCR Next Gen: NOT DETECTED

## 2022-11-13 LAB — MAGNESIUM: Magnesium: 1.8 mg/dL (ref 1.7–2.4)

## 2022-11-13 MED ORDER — AMLODIPINE BESYLATE 10 MG PO TABS
10.0000 mg | ORAL_TABLET | Freq: Every day | ORAL | Status: DC
  Administered 2022-11-13 – 2022-11-14 (×2): 10 mg via ORAL
  Filled 2022-11-13 (×2): qty 1

## 2022-11-13 MED ORDER — MAGNESIUM SULFATE 2 GM/50ML IV SOLN
2.0000 g | Freq: Once | INTRAVENOUS | Status: AC
  Administered 2022-11-13: 2 g via INTRAVENOUS
  Filled 2022-11-13: qty 50
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
## 2022-11-13 NOTE — Progress Notes (Signed)
   NAME:  Lauren Clark, MRN:  [id number], DOB:  Feb 09, 1956, LOS: 1 ADMISSION DATE:  11/12/2022, CONSULTATION DATE:  11/13/2022 REFERRING MD:  EDP, CHIEF COMPLAINT:  chest pain   History of Present Illness:  Lauren Clark is a 66 year old woman with a past medical history of hypothyroidism, hypertension, and hyperlipidemia who presents with chest pain of acute onset earlier this morning.  After waking up from her sleep around 6 AM she developed sudden onset of sharp stabbing central chest pain which radiated to her back.  Not accompanied by shortness of breath, cough, fevers chills, wheezing.  She has never had anything like this before.  She did not take anything at home for the symptoms.  Upon arrival to the ED for worsening symptoms she had workup that ruled out myocardial infarction.  D-dimer was elevated so CT PE study was obtained.  This showed no pulmonary embolism but did show intramural thrombus of the ascending aorta versus type aortic dissection.  She was found to be hypertensive.  Her chest pain has gotten a little better with blood pressure control.  CT surgery was consulted and recommended admission to the ICU and medical management of blood pressure.  PCCM asked to admit.  Pertinent  Medical History  Hypertension Hyperlipidemia Hypothyroidism She is a lifelong never smoker and nondrinker  Significant Hospital Events: Including procedures, antibiotic start and stop dates in addition to other pertinent events   5/12 admit  Interim History / Subjective:  No distress Wants food Chest/back pain with movement  Objective   Blood pressure 96/63, pulse 69, temperature 98.3 F (36.8 C), temperature source Oral, resp. rate 11, height 5' (1.524 m), weight 95.4 kg, SpO2 96 %.        Intake/Output Summary (Last 24 hours) at 11/13/2022 1343 Last data filed at 11/13/2022 0800 Gross per 24 hour  Intake 685 ml  Output 900 ml  Net -215 ml   Filed Weights   11/12/22 1259 11/13/22 0400   Weight: 91.6 kg 95.4 kg    Examination: No distress Ext with equal pulses Globally weak Abd soft Lungs clear Heart sounds regular  Cr improved CBC stable  Resolved Hospital Problem list     Assessment & Plan:   Type 1 Aortic Dissection vs Intramural Hematoma Hypertensive Emergency - Cardene to SBP <140 - Add amlodipine, continue ARB - Repeat imaging, if indicated, per TCTS  Hyperlipidemia Continue statin  Hypothyroidism Continue Synthroid  Depression Continue Zoloft  Severe arthritis- home pain meds  Best Practice (right click and "Reselect all SmartList Selections" daily)   Diet/type: Regular consistency (see orders) DVT prophylaxis: prophylactic heparin  GI prophylaxis: N/A Lines: N/A Foley:  N/A Code Status:  full code Last date of multidisciplinary goals of care discussion []   31 min cc time Myrla Halsted MD PCCM

## 2022-11-13 NOTE — Progress Notes (Signed)
eLink Physician-Brief Progress Note Patient Name: Lauren Clark DOB: Mar 07, 1956 MRN: [id number]   Date of Service  11/13/2022  HPI/Events of Note  67 year old female presented with chest pain type a dissection requesting clarification on SBP goal.  Anticipating medical management  eICU Interventions  BP goal less than 140   0107 -A-fib RVR, rates in the 120s-150s.  Blood pressure in the 120s systolic. Will initiate amiodarone. May benefit from updated ECHO, will order.    Intervention Category Minor Interventions: Routine modifications to care plan (e.g. PRN medications for pain, fever)  Barnett Elzey 11/13/2022, 8:30 PM

## 2022-11-13 NOTE — Progress Notes (Signed)
[facility] ADULT ICU REPLACEMENT PROTOCOL   The patient does apply for the [facility] Adult ICU Electrolyte Replacment Protocol based on the criteria listed below:   1.Exclusion criteria: TCTS, ECMO, Dialysis, and Myasthenia Gravis patients 2. Is GFR >/= 30 ml/min? Yes.    Patient's GFR today is >60 3. Is SCr </= 2? Yes.   Patient's SCr is 0.97 mg/dL 4. Did SCr increase >/= 0.5 in 24 hours? No. 5.Pt's weight >40kg  Yes.   6. Abnormal electrolyte(s): Mag  7. Electrolytes replaced per protocol 8.  Call MD STAT for K+ </= 2.5, Phos </= 1, or Mag </= 1 Physician:  Forbes Cellar [facility] 11/13/2022 3:25 AM

## 2022-11-14 ENCOUNTER — Inpatient Hospital Stay (HOSPITAL_COMMUNITY): Payer: 59

## 2022-11-14 DIAGNOSIS — I4891 Unspecified atrial fibrillation: Secondary | ICD-10-CM

## 2022-11-14 DIAGNOSIS — I161 Hypertensive emergency: Secondary | ICD-10-CM | POA: Diagnosis not present

## 2022-11-14 LAB — HEPATIC FUNCTION PANEL
ALT: 28 U/L (ref 0–44)
AST: 17 U/L (ref 15–41)
Albumin: 3 g/dL — ABNORMAL LOW (ref 3.5–5.0)
Alkaline Phosphatase: 93 U/L (ref 38–126)
Bilirubin, Direct: 0.2 mg/dL (ref 0.0–0.2)
Indirect Bilirubin: 1.3 mg/dL — ABNORMAL HIGH (ref 0.3–0.9)
Total Bilirubin: 1.5 mg/dL — ABNORMAL HIGH (ref 0.3–1.2)
Total Protein: 6.4 g/dL — ABNORMAL LOW (ref 6.5–8.1)

## 2022-11-14 LAB — BASIC METABOLIC PANEL
Anion gap: 6 (ref 5–15)
BUN: 16 mg/dL (ref 8–23)
CO2: 22 mmol/L (ref 22–32)
Calcium: 8.3 mg/dL — ABNORMAL LOW (ref 8.9–10.3)
Chloride: 100 mmol/L (ref 98–111)
Creatinine, Ser: 0.92 mg/dL (ref 0.44–1.00)
GFR, Estimated: >60 mL/min (ref >60)
Glucose, Bld: 134 mg/dL — ABNORMAL HIGH (ref 70–99)
Potassium: 4.2 mmol/L (ref 3.5–5.1)
Sodium: 128 mmol/L — ABNORMAL LOW (ref 135–145)

## 2022-11-14 LAB — ECHOCARDIOGRAM COMPLETE
Area-P 1/2: 5.06 cm2
Height: 60 in
S' Lateral: 2.5 cm
Weight: 3396.85 oz

## 2022-11-14 LAB — SODIUM: Sodium: 131 mmol/L — ABNORMAL LOW (ref 135–145)

## 2022-11-14 LAB — TSH: TSH: 2.799 u[IU]/mL (ref 0.350–4.500)

## 2022-11-14 LAB — HEMOGLOBIN A1C
Hgb A1c MFr Bld: 5.3 % (ref 4.8–5.6)
Mean Plasma Glucose: 105.41 mg/dL

## 2022-11-14 LAB — MAGNESIUM: Magnesium: 1.9 mg/dL (ref 1.7–2.4)

## 2022-11-14 MED ORDER — AMIODARONE HCL IN DEXTROSE 360-4.14 MG/200ML-% IV SOLN
30.0000 mg/h | INTRAVENOUS | Status: DC
  Administered 2022-11-14 – 2022-11-15 (×3): 30 mg/h via INTRAVENOUS
  Filled 2022-11-14 (×2): qty 200

## 2022-11-14 MED ORDER — MAGNESIUM SULFATE 2 GM/50ML IV SOLN
2.0000 g | Freq: Once | INTRAVENOUS | Status: AC
  Administered 2022-11-14: 2 g via INTRAVENOUS
  Filled 2022-11-14: qty 50

## 2022-11-14 MED ORDER — AMIODARONE IV BOLUS ONLY 150 MG/100ML
150.0000 mg | Freq: Once | INTRAVENOUS | Status: AC
  Administered 2022-11-14: 150 mg via INTRAVENOUS

## 2022-11-14 MED ORDER — AMIODARONE LOAD VIA INFUSION
150.0000 mg | Freq: Once | INTRAVENOUS | Status: AC
  Administered 2022-11-14: 150 mg via INTRAVENOUS
  Filled 2022-11-14: qty 83.34

## 2022-11-14 MED ORDER — AMIODARONE HCL IN DEXTROSE 360-4.14 MG/200ML-% IV SOLN
60.0000 mg/h | INTRAVENOUS | Status: AC
  Administered 2022-11-14: 60 mg/h via INTRAVENOUS
  Filled 2022-11-14: qty 400

## 2022-11-14 MED ORDER — AMIODARONE HCL IN DEXTROSE 360-4.14 MG/200ML-% IV SOLN
INTRAVENOUS | Status: AC
  Administered 2022-11-14: 60 mg/h via INTRAVENOUS
  Filled 2022-11-14: qty 200

## 2022-11-14 MED ORDER — IRBESARTAN 75 MG PO TABS
75.0000 mg | ORAL_TABLET | Freq: Two times a day (BID) | ORAL | Status: DC
  Administered 2022-11-15: 75 mg via ORAL
  Filled 2022-11-14: qty 1

## 2022-11-14 MED ORDER — FLUOXETINE HCL 20 MG PO CAPS
40.0000 mg | ORAL_CAPSULE | Freq: Every day | ORAL | Status: DC
  Administered 2022-11-14 – 2022-11-19 (×6): 40 mg via ORAL
  Filled 2022-11-14 (×6): qty 2

## 2022-11-14 MED ORDER — ROSUVASTATIN CALCIUM 5 MG PO TABS
10.0000 mg | ORAL_TABLET | Freq: Every evening | ORAL | Status: DC
  Administered 2022-11-14 – 2022-11-18 (×5): 10 mg via ORAL
  Filled 2022-11-14 (×5): qty 2

## 2022-11-14 NOTE — Progress Notes (Signed)
NAME:  Lauren Clark, MRN:  [id number], DOB:  01/31/1956, LOS: 2 ADMISSION DATE:  11/12/2022, CONSULTATION DATE:  11/14/2022 REFERRING MD:  EDP, CHIEF COMPLAINT:  chest pain   History of Present Illness:  Lauren Clark is a 67 year old woman with a past medical history of hypothyroidism, hypertension, and hyperlipidemia who presents with chest pain of acute onset earlier this morning.  After waking up from her sleep around 6 AM she developed sudden onset of sharp stabbing central chest pain which radiated to her back.  Not accompanied by shortness of breath, cough, fevers chills, wheezing.  She has never had anything like this before.  She did not take anything at home for the symptoms.  Upon arrival to the ED for worsening symptoms she had workup that ruled out myocardial infarction.  D-dimer was elevated so CT PE study was obtained.  This showed no pulmonary embolism but did show intramural thrombus of the ascending aorta versus type aortic dissection.  She was found to be hypertensive.  Her chest pain has gotten a little better with blood pressure control.  CT surgery was consulted and recommended admission to the ICU and medical management of blood pressure.  PCCM asked to admit.  Pertinent  Medical History  Hypertension Hyperlipidemia Hypothyroidism Morbid obesity s/p gastric sleeve 2017 Glaucoma w/ left eye blindness, limited R vision DJD Depression PVT, DVT RLE She is a lifelong never smoker and nondrinker  Significant Hospital Events: Including procedures, antibiotic start and stop dates in addition to other pertinent events   5/11 admit overnight 5/12 TCTS consult>  BP management on cardene < 140  Interim History / Subjective:  Afib RVR overnight placed on amiodarone Having some CP with exertion back to bed but non radiating Off cardene   Objective   Blood pressure 107/77, pulse 69, temperature 98.1 F (36.7 C), temperature source Oral, resp. rate (!) 25, height 5' (1.524 m),  weight 96.3 kg, SpO2 95 %.        Intake/Output Summary (Last 24 hours) at 11/14/2022 0840 Last data filed at 11/14/2022 0800 Gross per 24 hour  Intake 626.07 ml  Output 2150 ml  Net -1523.93 ml   Filed Weights   11/12/22 1259 11/13/22 0400 11/14/22 0545  Weight: 91.6 kg 95.4 kg 96.3 kg    Examination: General:  chronically ill, morbidly obese appearing elderly female getting back into bed from bedside recliner HEENT: MM pink/moist Neuro: Awake, oriented, MAE CV: irir, afib, some RVR in 120/130s occasionally PULM:  non labored, CTA GI:  soft, bs+, NT, no foley Extremities: warm/dry, no LE edema  Skin: no rashes   UOP 2.6L/ 24hrs  Net -1.8L  Labs> Na 135> 128?, K 4.2, sCr stable, t. Bili 1.5, CBC wnl  Resolved Hospital Problem list     Assessment & Plan:   Type 1 Aortic Dissection vs Intramural Hematoma Hypertensive Emergency - off cardene overnight, SBP 107-120s, goal < 140.  If BP stable, consider switching amio to cardizem  - norvasc 10mg , irbesartan 150mg  daily - further/ repeat imaging per TCTS  Afib w/ RVR- appears new onset  - cont tele  - cont amiodarone for now pending BP trend, if stable, will switch to cardizem - additional bolus 150mg  now for rate control - echo pending - CHA2DS2-VASc 4, will need to defer to TCTS  when ok to start [facility] - keep Mag> 2, K > 4;  recheck Mag now - TSH ok at 2.799, check FT4  Hyponatremia - Na 135>  128, ?erroneous error, recheck now  - I/Os -1.8L  Hyperlipidemia - crestor  Hypothyroidism - Synthroid  Depression - cont prozac   Severe arthritis - oxy IR prn  Morbid obesity s/p gastric sleeve 2017 - hold outpt victoza, recheck A1c, unclear if for DM (normal A1c 2017 vs for wt loss)  Best Practice (right click and "Reselect all SmartList Selections" daily)   Diet/type: Regular consistency (see orders) DVT prophylaxis: SCD GI prophylaxis: N/A Lines: N/A Foley:  N/A Code Status:  full code Last date of  multidisciplinary goals of care discussion []   Pt updated on plan of care.    Posey Boyer, MSN, AG-ACNP-BC [facility] 11/14/2022, 11:24 AM  See Amion for pager If no response to pager, please call PCCM consult pager After 7:00 pm call Elink

## 2022-11-14 NOTE — Progress Notes (Signed)
TCTS   Subjective:  Still has some pain in chest and back when she moves around. Had some this morning after she ate a pancake.  Objective: Vital signs in last 24 hours: Temp:  [98.1 F (36.7 C)-98.4 F (36.9 C)] 98.4 F (36.9 C) (05/13 1120) Cardiac Rhythm: Atrial fibrillation (05/13 1300) Resp:  [16-31] 19 (05/13 1400) BP: (90-166)/(58-118) 90/63 (05/13 1400) SpO2:  [90 %-95 %] 91 % (05/13 1400) Weight:  [96.3 kg] 96.3 kg (05/13 0545)  Hemodynamic parameters for last 24 hours:    Intake/Output from previous day: 05/12 0701 - 05/13 0700 In: 520.8 [P.O.:120; I.V.:400.8] Out: 2600 [Urine:2600] Intake/Output this shift: Total I/O In: 120 [P.O.:120] Out: 1400 [Urine:1400]  General appearance: alert and cooperative Neurologic: intact Heart: irregularly irregular rhythm and no murmur Lungs: clear to auscultation bilaterally  Lab Results: Recent Labs    11/12/22 1301 11/13/22 0153  WBC 13.1* 9.6  HGB 14.6 13.5  HCT 46.2* 41.8  PLT 216 164   BMET:  Recent Labs    11/13/22 0153 11/14/22 0318 11/14/22 0927  NA 135 128* 131*  K 4.5 4.2  --   CL 102 100  --   CO2 23 22  --   GLUCOSE 131* 134*  --   BUN 15 16  --   CREATININE 0.97 0.92  --   CALCIUM 8.5* 8.3*  --     PT/INR: No results for input(s): "LABPROT", "INR" in the last 72 hours. ABG No results found for: "PHART", "HCO3", "TCO2", "ACIDBASEDEF", "O2SAT" CBG (last 3)  No results for input(s): "GLUCAP" in the last 72 hours.  Assessment/Plan:  Stable hemodynamics, still in atrial fib on IV amio. I would hold off on anticoagulants in this patient.   Her pain sounds atypical for dissection/intramural hematoma pain.   2D echo shows 5.2 cm aortic root with trivial AI. EF normal, no pericardial effusion.   LOS: 2 days    Alleen Borne 11/14/2022

## 2022-11-15 ENCOUNTER — Inpatient Hospital Stay (HOSPITAL_COMMUNITY): Payer: 59

## 2022-11-15 DIAGNOSIS — R079 Chest pain, unspecified: Secondary | ICD-10-CM

## 2022-11-15 LAB — BASIC METABOLIC PANEL
Anion gap: 13 (ref 5–15)
BUN: 26 mg/dL — ABNORMAL HIGH (ref 8–23)
CO2: 19 mmol/L — ABNORMAL LOW (ref 22–32)
Calcium: 8.7 mg/dL — ABNORMAL LOW (ref 8.9–10.3)
Chloride: 98 mmol/L (ref 98–111)
Creatinine, Ser: 1.08 mg/dL — ABNORMAL HIGH (ref 0.44–1.00)
GFR, Estimated: 56 mL/min — ABNORMAL LOW (ref >60)
Glucose, Bld: 124 mg/dL — ABNORMAL HIGH (ref 70–99)
Potassium: 4.5 mmol/L (ref 3.5–5.1)
Sodium: 130 mmol/L — ABNORMAL LOW (ref 135–145)

## 2022-11-15 LAB — HEPATIC FUNCTION PANEL
ALT: 22 U/L (ref 0–44)
AST: 20 U/L (ref 15–41)
Albumin: 2.9 g/dL — ABNORMAL LOW (ref 3.5–5.0)
Alkaline Phosphatase: 86 U/L (ref 38–126)
Bilirubin, Direct: 0.2 mg/dL (ref 0.0–0.2)
Indirect Bilirubin: 0.8 mg/dL (ref 0.3–0.9)
Total Bilirubin: 1 mg/dL (ref 0.3–1.2)
Total Protein: 6.2 g/dL — ABNORMAL LOW (ref 6.5–8.1)

## 2022-11-15 LAB — T4, FREE: Free T4: 1.27 ng/dL — ABNORMAL HIGH (ref 0.61–1.12)

## 2022-11-15 LAB — CBC
HCT: 42 % (ref 36.0–46.0)
Hemoglobin: 14.2 g/dL (ref 12.0–15.0)
MCH: 31.7 pg (ref 26.0–34.0)
MCHC: 33.8 g/dL (ref 30.0–36.0)
MCV: 93.8 fL (ref 80.0–100.0)
Platelets: 212 10*3/uL (ref 150–400)
RBC: 4.48 MIL/uL (ref 3.87–5.11)
RDW: 12.6 % (ref 11.5–15.5)
WBC: 12.5 10*3/uL — ABNORMAL HIGH (ref 4.0–10.5)
nRBC: 0 % (ref 0.0–0.2)

## 2022-11-15 LAB — MAGNESIUM: Magnesium: 2.1 mg/dL (ref 1.7–2.4)

## 2022-11-15 MED ORDER — METOPROLOL TARTRATE 12.5 MG HALF TABLET
12.5000 mg | ORAL_TABLET | Freq: Two times a day (BID) | ORAL | Status: DC
  Administered 2022-11-15 – 2022-11-19 (×8): 12.5 mg via ORAL
  Filled 2022-11-15 (×8): qty 1

## 2022-11-15 MED ORDER — HYALURONIDASE HUMAN 150 UNIT/ML IJ SOLN
150.0000 [IU] | Freq: Once | INTRAMUSCULAR | Status: AC
  Administered 2022-11-15: 150 [IU] via SUBCUTANEOUS
  Filled 2022-11-15: qty 1

## 2022-11-15 MED ORDER — LIDOCAINE 5 % EX PTCH
1.0000 | MEDICATED_PATCH | CUTANEOUS | Status: DC
  Administered 2022-11-15 – 2022-11-19 (×5): 1 via TRANSDERMAL
  Filled 2022-11-15 (×5): qty 1

## 2022-11-15 MED ORDER — AMIODARONE IV BOLUS ONLY 150 MG/100ML
150.0000 mg | Freq: Once | INTRAVENOUS | Status: AC
  Administered 2022-11-15: 150 mg via INTRAVENOUS

## 2022-11-15 MED ORDER — DIGOXIN 0.25 MG/ML IJ SOLN
0.2500 mg | Freq: Four times a day (QID) | INTRAMUSCULAR | Status: AC
  Administered 2022-11-15 (×2): 0.25 mg via INTRAVENOUS
  Filled 2022-11-15 (×2): qty 2

## 2022-11-15 MED ORDER — AMIODARONE HCL IN DEXTROSE 360-4.14 MG/200ML-% IV SOLN
30.0000 mg/h | INTRAVENOUS | Status: AC
  Administered 2022-11-15 – 2022-11-18 (×6): 30 mg/h via INTRAVENOUS
  Filled 2022-11-15 (×6): qty 200

## 2022-11-15 NOTE — Progress Notes (Signed)
NAME:  Lauren Clark, MRN:  [id number], DOB:  Nov 07, 1955, LOS: 3 ADMISSION DATE:  11/12/2022, CONSULTATION DATE:  11/15/2022 REFERRING MD:  EDP, CHIEF COMPLAINT:  chest pain   History of Present Illness:  Lauren Clark is a 67 year old woman with a past medical history of hypothyroidism, hypertension, and hyperlipidemia who presents with chest pain of acute onset earlier this morning.  After waking up from her sleep around 6 AM she developed sudden onset of sharp stabbing central chest pain which radiated to her back.  Not accompanied by shortness of breath, cough, fevers chills, wheezing.  She has never had anything like this before.  She did not take anything at home for the symptoms.  Upon arrival to the ED for worsening symptoms she had workup that ruled out myocardial infarction.  D-dimer was elevated so CT PE study was obtained.  This showed no pulmonary embolism but did show intramural thrombus of the ascending aorta versus type aortic dissection.  She was found to be hypertensive.  Her chest pain has gotten a little better with blood pressure control.  CT surgery was consulted and recommended admission to the ICU and medical management of blood pressure.  PCCM asked to admit.  Pertinent  Medical History  Hypertension Hyperlipidemia Hypothyroidism Morbid obesity s/p gastric sleeve 2017 Glaucoma w/ left eye blindness, limited R vision DJD Depression PVT, DVT RLE She is a lifelong never smoker and nondrinker  Significant Hospital Events: Including procedures, antibiotic start and stop dates in addition to other pertinent events   5/11 admit overnight 5/12 TCTS consult>  BP management on cardene < 140 5/13 afib with RVR on amiodarone, off cardene   Interim History / Subjective:  Still having intermittent back pain, some exertional chest pain, feels like food sometimes get stuck which is new, and c/o of AoC right knee pain IV amio infusion infiltrated in R forearm  Objective   Blood  pressure 96/72, pulse 69, temperature 98.4 F (36.9 C), temperature source Oral, resp. rate 19, height 5' (1.524 m), weight 95.6 kg, SpO2 92 %.        Intake/Output Summary (Last 24 hours) at 11/15/2022 1022 Last data filed at 11/15/2022 0600 Gross per 24 hour  Intake 495.94 ml  Output 1650 ml  Net -1154.06 ml   Filed Weights   11/13/22 0400 11/14/22 0545 11/15/22 0500  Weight: 95.4 kg 96.3 kg 95.6 kg    Examination: General:  pleasant obese elderly female sitting in bedside recliner in NAD, resting HEENT: MM pink/moist, glasses Neuro: awakens easily, oriented, appropriate, MAE CV: irir, rate 115- 130s PULM:  non labored, appears shallow, clear, few bibasilar inspiratory rales, no wheeze, able to pull 500-750 on IS.  On [place] 4L sats 94, on room air (Posey out of nose- resting at 88-89) GI: soft, bs+, NT Extremities: warm/dry, trace LE, no obvious warmth/ erythema/ edema to right knee compared to left- old scars in place  Skin: no rashes  afebrile UOP 3L/ 24hrs Labs ordered for am, not drawn> lab called   Resolved Hospital Problem list     Assessment & Plan:   Type 1 Aortic Dissection vs Intramural Hematoma Hypertensive Emergency - SBP goal < 140 - has been more borderline hypotensive.  Norvasc stopped.  S/p 75mg  Irbesartan this am> split to have better BP control, will hold for now and monitor BP closely.  Ideally needs something with BB affect  - further/ repeat imaging per TCTS - pain management   Afib w/ RVR-  appears new onset  - cont tele  - rate remains uncontrolled but IV infiltrated> warm compresses, elevate, and hyaluronidase per pharm protocol  - cont amio gtt for now, may need to rebolus for rate control - adding digoxin.  If BP better, metoprolol later today - echo > severely dilated root 52 mm, EF 60-65%, no RWMA, indeterminate diastolic function, normal RV,  - CHA2DS2-VASc 4, will need to defer to TCTS  when ok to start AC> hold for now per TCTS  - keep Mag> 2,  K > 4> pending labs   Hypoxia - suspect atelectasis, pending CXR - supplemental O2 PRN for sat goal > 90% - encouraged for aggressive pulmonary hygiene IS  Hyponatremia - pending BMET  Hyperlipidemia - crestor  Hypothyroidism - cont Synthroid  Depression - cont prozac   Severe arthritis AoC knee pain, right - oxy IR prn - prior multiple bilateral knee surgeries - XR right knee ordered - lidocaine patch prn   Morbid obesity s/p gastric sleeve 2017 - hold outpt victoza, on for wt loss,  A1c 5.3  Best Practice (right click and "Reselect all SmartList Selections" daily)   Diet/type: Regular consistency (see orders) DVT prophylaxis: SCD GI prophylaxis: N/A Lines: N/A Foley:  N/A Code Status:  full code Last date of multidisciplinary goals of care discussion []   Pt updated on plan of care.   CCT 30 mins  Posey Boyer, MSN, AG-ACNP-BC [facility] 11/15/2022, 10:22 AM  See Amion for pager If no response to pager, please call PCCM consult pager After 7:00 pm call Elink

## 2022-11-16 DIAGNOSIS — I10 Essential (primary) hypertension: Secondary | ICD-10-CM | POA: Diagnosis not present

## 2022-11-16 DIAGNOSIS — I161 Hypertensive emergency: Secondary | ICD-10-CM | POA: Diagnosis not present

## 2022-11-16 DIAGNOSIS — I480 Paroxysmal atrial fibrillation: Secondary | ICD-10-CM | POA: Diagnosis not present

## 2022-11-16 DIAGNOSIS — I7100 Dissection of unspecified site of aorta: Secondary | ICD-10-CM | POA: Diagnosis not present

## 2022-11-16 LAB — BASIC METABOLIC PANEL
Anion gap: 12 (ref 5–15)
BUN: 27 mg/dL — ABNORMAL HIGH (ref 8–23)
CO2: 22 mmol/L (ref 22–32)
Calcium: 8.7 mg/dL — ABNORMAL LOW (ref 8.9–10.3)
Chloride: 96 mmol/L — ABNORMAL LOW (ref 98–111)
Creatinine, Ser: 1.09 mg/dL — ABNORMAL HIGH (ref 0.44–1.00)
GFR, Estimated: 56 mL/min — ABNORMAL LOW (ref >60)
Glucose, Bld: 163 mg/dL — ABNORMAL HIGH (ref 70–99)
Potassium: 4.4 mmol/L (ref 3.5–5.1)
Sodium: 130 mmol/L — ABNORMAL LOW (ref 135–145)

## 2022-11-16 LAB — MAGNESIUM: Magnesium: 1.9 mg/dL (ref 1.7–2.4)

## 2022-11-16 LAB — GLUCOSE, CAPILLARY
Glucose-Capillary: 143 mg/dL — ABNORMAL HIGH (ref 70–99)
Glucose-Capillary: 97 mg/dL (ref 70–99)

## 2022-11-16 MED ORDER — LACTATED RINGERS IV BOLUS
500.0000 mL | Freq: Once | INTRAVENOUS | Status: AC
  Administered 2022-11-16: 500 mL via INTRAVENOUS

## 2022-11-16 NOTE — Progress Notes (Addendum)
NAME:  NITZA BACIK, MRN:  [id number], DOB:  28-Nov-1955, LOS: 4 ADMISSION DATE:  11/12/2022, CONSULTATION DATE:  11/16/2022 REFERRING MD:  EDP, CHIEF COMPLAINT:  chest pain   History of Present Illness:  ALEANNA SURRENCY is a 67 year old woman with a past medical history of hypothyroidism, hypertension, and hyperlipidemia who presents with chest pain of acute onset earlier this morning.  After waking up from her sleep around 6 AM she developed sudden onset of sharp stabbing central chest pain which radiated to her back.  Not accompanied by shortness of breath, cough, fevers chills, wheezing.  She has never had anything like this before.  She did not take anything at home for the symptoms.  Upon arrival to the ED for worsening symptoms she had workup that ruled out myocardial infarction.  D-dimer was elevated so CT PE study was obtained.  This showed no pulmonary embolism but did show intramural thrombus of the ascending aorta versus type aortic dissection.  She was found to be hypertensive.  Her chest pain has gotten a little better with blood pressure control.  CT surgery was consulted and recommended admission to the ICU and medical management of blood pressure.  PCCM asked to admit.  Pertinent  Medical History  Hypertension Hyperlipidemia Hypothyroidism Morbid obesity s/p gastric sleeve 2017 Glaucoma w/ left eye blindness, limited R vision DJD Depression PVT, DVT RLE She is a lifelong never smoker and nondrinker  Significant Hospital Events: Including procedures, antibiotic start and stop dates in addition to other pertinent events   5/11 admit overnight 5/12 TCTS consult>  BP management on cardene < 140 5/13 afib with RVR on amiodarone, off cardene  5/14 - rate remains uncontrolled but IV infiltrated> warm compresses, elevate, and hyaluronidase per pharm protocol.  echo > severely dilated root 52 mm, EF 60-65%, no RWMA, indeterminate diastolic function, normal RV 5/15 BP 90s. HR better     Interim History / Subjective:  No distress does c/o knee pain  Objective   Blood pressure (Abnormal) 89/68, pulse 69, temperature 98.7 F (37.1 C), temperature source Oral, resp. rate (Abnormal) 25, height 5' (1.524 m), weight 95.8 kg, SpO2 94 %.        Intake/Output Summary (Last 24 hours) at 11/16/2022 0929 Last data filed at 11/16/2022 0800 Gross per 24 hour  Intake 511.06 ml  Output 2050 ml  Net -1538.94 ml   Filed Weights   11/14/22 0545 11/15/22 0500 11/16/22 0530  Weight: 96.3 kg 95.6 kg 95.8 kg    Examination: General resting in chair no distress HENT NCAT no JVD Pulm clear  Card reg irreg  Abd soft Ext warm left arm indurated at old IV site but pain improved.  Neuro intact   Resolved Hospital Problem list     Assessment & Plan:   Type 1 Aortic Dissection vs Intramural Hematoma Hypertensive Emergency - has been more borderline hypotensive.  Norvasc stopped.  S/p 75mg  Irbesartan this 5/14 > split to have better BP control, will hold for now and monitor BP closely Plan pain management  Now on low dose lopressor Goal SBP < 140 Repeat CT imaging 5/20   Afib w/ RVR- appears new onset  - CHA2DS2-VASc 4, Plan Cont tele  Cont amiodarone gtt  AC deferred to CVTS (if stable repeat CT can consider AC) K>4 Mg > 2 Will have cards see   Hypoxia 2/2 hypoventilation. CXR low volume  Plan Wean o2 Encourage pulm hygiene    Hyponatremia-->stable Plan Monitor  Hyperlipidemia Plan crestor  Hypothyroidism Plan  cont Synthroid  Depression Plan cont prozac   Severe arthritis AoC knee pain, right - prior multiple bilateral knee surgeries. . The mid AP dimension of the distal femoral prosthesis appears to be approximately 11 mm posterior to the mid AP dimension of the tibial prosthesis, suggesting new mild anterior subluxation of the tibial component Plan oxy IR prn lidocaine patch prn  Spoke w/ Ortho: Knee immobilizer, WBAT. Can have KI off when  not ambulating. She needs a revision on that knee once she's more stable. She can see Charlann Boxer once she gets discharged.   Morbid obesity s/p gastric sleeve 2017 Plan hold outpt victoza, on for wt loss  Best Practice (right click and "Reselect all SmartList Selections" daily)   Diet/type: Regular consistency (see orders) DVT prophylaxis: SCD GI prophylaxis: N/A Lines: N/A Foley:  N/A Code Status:  full code Last date of multidisciplinary goals of care discussion []   OK to transfer to progressive  Simonne Martinet ACNP-BC [facility] Pulmonary/Critical Care Pager # [phone] OR # [phone] if no answer

## 2022-11-16 NOTE — TOC Initial Note (Signed)
Transition of Care [facility]) - Initial/Assessment Note    Patient Details  Name: Lauren Clark MRN: [id number] Date of Birth: 1956-02-25  [facility]) CM/SW Contact:    Elliot Cousin, RN Phone Number: [phone] 11/16/2022, 7:33 AM  Clinical Narrative:   CM spoke to pt and states she lives at home alone. Drives to her appts. Will continue to follow for dc needs.                 Expected Discharge Plan: Home w Home Health Services Barriers to Discharge: Continued Medical Work up   Patient Goals and CMS Choice            Expected Discharge Plan and Services       Living arrangements for the past 2 months: Apartment                                      Prior Living Arrangements/Services Living arrangements for the past 2 months: Apartment Lives with:: Self                   Activities of Daily Living Home Assistive Devices/Equipment: Cane (specify quad or straight), Walker (specify type) ADL Screening (condition at time of admission) Patient's cognitive ability adequate to safely complete daily activities?: Yes Is the patient deaf or have difficulty hearing?: No Does the patient have difficulty seeing, even when wearing glasses/contacts?: Yes (degentaritive myopia right eye; left eye no vision) Does the patient have difficulty concentrating, remembering, or making decisions?: No Patient able to express need for assistance with ADLs?: Yes Does the patient have difficulty dressing or bathing?: No Independently performs ADLs?: Yes (appropriate for developmental age) Does the patient have difficulty walking or climbing stairs?: No Weakness of Legs: None Weakness of Arms/Hands: None  Permission Sought/Granted Permission sought to share information with : Case Manager                Emotional Assessment Appearance:: Appears stated age Attitude/Demeanor/Rapport: Engaged Affect (typically observed): Accepting Orientation: : Oriented to  Self, Oriented to Place, Oriented to  Time, Oriented to Situation   Psych Involvement: No (comment)  Admission diagnosis:  Aortic dissection (HCC) [I71.00] Patient Active Problem List   Diagnosis Date Noted   Aortic dissection (HCC) 11/12/2022   Positive hepatitis C antibody test 09/02/2019   S/P left TKA 09/05/2017   S/P total knee replacement 09/05/2017   S/P right THA, AA 06/14/2016   Prediabetes 02/29/2016   Dyslipidemia 02/29/2016   Degenerative joint disease of right hip 02/29/2016   S/P laparoscopic sleeve gastrectomy 02/29/2016   Preoperative cardiovascular examination 02/19/2016   Hyperlipidemia 02/19/2016   Morbid obesity due to excess calories (HCC) 02/19/2016   Essential hypertension 02/19/2016   PCP:  Ellyn Hack, MD Pharmacy:   [id number] - [place], [place] - [address] AT [facility] [address] [place] [address] Phone: [phone] Fax: [phone]     Social Determinants of Health (SDOH) Social History: SDOH Screenings   Food Insecurity: No Food Insecurity (11/12/2022)  Housing: Low Risk  (11/12/2022)  Transportation Needs: No Transportation Needs (11/12/2022)  Utilities: Not At Risk (11/12/2022)  Depression (PHQ2-9): Low Risk  (09/02/2019)  Tobacco Use: Low Risk  (11/12/2022)   SDOH Interventions:     Readmission Risk Interventions     No data to  display

## 2022-11-16 NOTE — Progress Notes (Signed)
Orthopedic Tech Progress Note Patient Details:  Lauren Clark [birth year] [id number] Knee immobilizer was delivered to patient's room.  Ortho Devices Type of Ortho Device: Knee Immobilizer Ortho Device/Splint Location: RLE Ortho Device/Splint Interventions: Ordered, Adjustment   Post Interventions Instructions Provided: Adjustment of device  Elaijah Munoz E Madalaine Portier 11/16/2022, 10:44 AM
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
## 2022-11-17 DIAGNOSIS — I7100 Dissection of unspecified site of aorta: Secondary | ICD-10-CM

## 2022-11-17 DIAGNOSIS — I71019 Dissection of thoracic aorta, unspecified: Secondary | ICD-10-CM | POA: Diagnosis not present

## 2022-11-17 DIAGNOSIS — I480 Paroxysmal atrial fibrillation: Secondary | ICD-10-CM | POA: Diagnosis not present

## 2022-11-17 LAB — BASIC METABOLIC PANEL
Anion gap: 9 (ref 5–15)
BUN: 28 mg/dL — ABNORMAL HIGH (ref 8–23)
CO2: 26 mmol/L (ref 22–32)
Calcium: 8.7 mg/dL — ABNORMAL LOW (ref 8.9–10.3)
Chloride: 96 mmol/L — ABNORMAL LOW (ref 98–111)
Creatinine, Ser: 0.98 mg/dL (ref 0.44–1.00)
GFR, Estimated: >60 mL/min (ref >60)
Glucose, Bld: 108 mg/dL — ABNORMAL HIGH (ref 70–99)
Potassium: 4.6 mmol/L (ref 3.5–5.1)
Sodium: 131 mmol/L — ABNORMAL LOW (ref 135–145)

## 2022-11-17 MED ORDER — SENNOSIDES-DOCUSATE SODIUM 8.6-50 MG PO TABS
1.0000 | ORAL_TABLET | Freq: Two times a day (BID) | ORAL | Status: DC
  Administered 2022-11-17 – 2022-11-19 (×4): 1 via ORAL
  Filled 2022-11-17 (×4): qty 1

## 2022-11-17 MED ORDER — HYDROMORPHONE HCL 1 MG/ML IJ SOLN: 0.5000 mg | INTRAMUSCULAR | Status: DC | PRN

## 2022-11-17 MED ORDER — ALUM & MAG HYDROXIDE-SIMETH 200-200-20 MG/5ML PO SUSP
30.0000 mL | Freq: Four times a day (QID) | ORAL | Status: DC | PRN
  Administered 2022-11-17: 30 mL via ORAL
  Filled 2022-11-17: qty 30

## 2022-11-17 MED ORDER — LACTATED RINGERS IV SOLN: INTRAVENOUS | Status: DC

## 2022-11-17 MED ORDER — TRAZODONE HCL 50 MG PO TABS
50.0000 mg | ORAL_TABLET | Freq: Every day | ORAL | Status: DC
  Administered 2022-11-17 – 2022-11-18 (×2): 50 mg via ORAL
  Filled 2022-11-17 (×2): qty 1

## 2022-11-17 MED ORDER — OXYCODONE HCL 5 MG PO TABS
10.0000 mg | ORAL_TABLET | ORAL | Status: DC | PRN
  Administered 2022-11-17 – 2022-11-19 (×6): 10 mg via ORAL
  Filled 2022-11-17 (×6): qty 2

## 2022-11-17 NOTE — Hospital Course (Addendum)
66 year old woman with a past medical history of hypothyroidism, hypertension, and hyperlipidemia who presents with chest pain of acute onset  after waking up from her sleep around 6 AM. Patient came to the ED had worsening of symptoms- D-dimer was elevated so CT PE study was obtained> showed no pulmonary embolism but did show intramural thrombus of the ascending aorta versus type aortic dissection. She was found to be hypertensive. Her chest pain has gotten a little better with blood pressure control. CT surgery was consulted and recommended admission to the ICU and medical management of blood pressure.  Patient was admitted to ICU 5/11 with intramural hematoma versus dissection.  Seen by cardiology, had A-fib with RVR -managed with amiodarone, subsequently off Cardene drip,Patient has subluxed right knee Ortho was consulted advised knee immobilizer WBAT and eventually needs revision and will follow-up with Dr. Charlann Boxer after discharge Echo showed severely dilated root 52 mm EF 60 to 65% no RWMA indeterminate diastolic function, BP improved in 90s heart rate better. Seen by Dr. Lavinia Sharps stable hemodynamics, advised to hold off anticoagulation. repeat CT dissection study shows worsening of dissection/hematoma reviewed by Dr. Dorris Fetch, and patient is not a candidate for surgical intervention explained the risk of anticoagulation which may lead to worsening of her dissection/bleeding.  I reviewed by cardiology discussed with the patient, at this point holding off on anticoagulation and patient agreeable. Palliative care has been involved overall symptoms better. She at this time wants to return home with outpatient palliative care. She mentionted about looking for second opinion> Advised to f/u with Dr Laneta Simmers and she is aware that two cardiothoracic surgeons said she is not a candidate for surgery. Heart rate doing well on amiodarone, metoprolol.

## 2022-11-17 NOTE — Care Management Important Message (Signed)
Important Message  Patient Details  Name: Lauren Clark MRN: [id number] Date of Birth: 1955/12/27   Medicare Important Message Given:  Yes     Renie Ora 11/17/2022, 8:47 AM

## 2022-11-17 NOTE — Progress Notes (Addendum)
PROGRESS NOTE Lauren Clark  HKV:[id number] DOB: 01/19/1956 DOA: 11/12/2022 PCP: Ellyn Hack, MD  Brief Narrative/Hospital Course: 67 year old woman with a past medical history of hypothyroidism, hypertension, and hyperlipidemia who presents with chest pain of acute onset  after waking up from her sleep around 6 AM. Patient came to the ED had worsening of symptoms- D-dimer was elevated so CT PE study was obtained> showed no pulmonary embolism but did show intramural thrombus of the ascending aorta versus type aortic dissection. She was found to be hypertensive. Her chest pain has gotten a little better with blood pressure control. CT surgery was consulted and recommended admission to the ICU and medical management of blood pressure.   Patient was admitted to ICU 5/11 with intramural hematoma versus dissection.  Seen by cardiology, had A-fib with RVR -managed with amiodarone, subsequently off Cardene drip,Patient has subluxed right knee Ortho was consulted advised knee immobilizer WBAT and eventually needs revision and will follow-up with Dr. Charlann Boxer after discharge Echo showed severely dilated root 52 mm EF 60 to 65% no RWMA indeterminate diastolic function, BP improved in 90s heart rate better. Seen by Dr. Lavinia Sharps stable hemodynamics, advised to hold off anticoagulation. Plan to repeat CT    Subjective: Seen and examined On RA Some chest pain and back pain Overnight has been having issues with hypotension transiently 68/59 at 11:45 PM this morning in 90s Labs this morning with stable renal function hyponatremia 131.   Assessment and Plan: Principal Problem:   Aortic dissection (HCC)   Aortic hematoma versus type B dissection with thrombosed false lumen Chest pain and back pain Hypertensive emergency: Per cardiothoracic surgery patient is a high risk for surgical intervention advised medical management/BP optimization. Dr. Lavinia Sharps advised against anticoagulation on his last progress note-  sent message to Dr. Laneta Simmers about further recommendation for repeat CT/ anticoagulation plan: He is in OR during this afternoon, Alycia Rossetti took message and will get back to [place]. BP remains well-controlled. Continues to have pain oxycodone increased to 10 mg, IV Dilaudid 0.5 mg added prn. Addendum at 2.30pm:Dr Lavinia Sharps informed that before putting back on eliquis she will need repeat CT, he is okay with ordering it today.  A-fib with RVR, new onset: Heart rate poorly controlled.  Hypotension limiting increase in metoprolol.  IV amiodarone added back on this morning  Acute hypoxic respiratory failure secondary to hypoventilation chest x-ray low volume.    Hyponatremia: Monitor Hyperlipidemia: Continue statin Hypothyroidism: Continue Synthroid.  Severe arthritis A1c knee pain, right Previous multiple bilateral knee surgeries: Imaging suggest new mild anterior subluxation of the tibial component cont pain control w/ oxy IR prn lidocaine patch prn , PCCM Spoke w/ Ortho: advised to cont Knee immobilizer, WBAT. Can have KI off when not ambulating. She needs a revision on that knee once she's more stable. She can see Charlann Boxer once she gets discharged  Morbid obesity Patient's Body mass index is 41.44 kg/m. : Will benefit with PCP follow-up, weight loss  healthy lifestyle and outpatient sleep evaluation.  Goals of care: Currently full code, she is however at high risk of decompensation. We will Involve palliative care  DVT prophylaxis: heparin injection 5,000 Units Start: 11/12/22 2200 SCDs Start: 11/12/22 1853 Code Status:   Code Status: Full Code Family Communication: plan of care discussed with patient at bedside. Patient status is:  inpatient because of aortic dissection Level of care: Progressive   Dispo: The patient is from: home lives alone  Anticipated disposition: TBD Objective: Vitals last 24 hrs: Vitals:   11/17/22 0500 11/17/22 0725 11/17/22 0818 11/17/22 0819  BP: 104/68 (!)  96/57    Pulse: 97 100  (!) 111  Resp: 18     Temp: 98.2 F (36.8 C) 98.4 F (36.9 C)    TempSrc: Oral Oral    SpO2:   94%   Weight: 96.3 kg     Height:       Weight change: 0.454 kg  Physical Examination: General exam: alert awake, older than stated age HEENT:Oral mucosa moist, Ear/Nose WNL grossly Respiratory system: bilaterally CLEAR BS, no use of accessory muscle Cardiovascular system: S1 & S2 +, No JVD. Gastrointestinal system: Abdomen soft,NT,ND, BS+ Nervous System:Alert, awake, moving extremities. Extremities: LE edema NEG,distal peripheral pulses palpable.  Skin: No rashes,no icterus. MSK: Normal muscle bulk,tone, power  Medications reviewed:  Scheduled Meds:  FLUoxetine  40 mg Oral Daily   heparin  5,000 Units Subcutaneous Q8H   latanoprost  1 drop Right Eye QHS   levothyroxine  88 mcg Oral QAC breakfast   lidocaine  1 patch Transdermal Q24H   metoprolol tartrate  12.5 mg Oral BID   rosuvastatin  10 mg Oral QPM  Continuous Infusions:  amiodarone Stopped (11/16/22 1524)    Diet Order             Diet heart healthy/carb modified Room service appropriate? Yes with Assist; Fluid consistency: Thin  Diet effective now                  Intake/Output Summary (Last 24 hours) at 11/17/2022 0922 Last data filed at 11/16/2022 1700 Gross per 24 hour  Intake 605.9 ml  Output 350 ml  Net 255.9 ml  Net IO Since Admission: -5,559.34 mL [11/17/22 0922]  Wt Readings from Last 3 Encounters:  11/17/22 96.3 kg  06/06/22 91.6 kg  09/05/17 88.9 kg     Unresulted Labs (From admission, onward)    None     Data Reviewed: I have personally reviewed following labs and imaging studies CBC: Recent Labs  Lab 11/12/22 1301 11/13/22 0153 11/15/22 1049  WBC 13.1* 9.6 12.5*  HGB 14.6 13.5 14.2  HCT 46.2* 41.8 42.0  MCV 97.7 96.8 93.8  PLT 216 164 212   Basic Metabolic Panel: Recent Labs  Lab 11/13/22 0153 11/14/22 0318 11/14/22 0927 11/15/22 1049 11/16/22 0238  11/17/22 0235  NA 135 128* 131* 130* 130* 131*  K 4.5 4.2  --  4.5 4.4 4.6  CL 102 100  --  98 96* 96*  CO2 23 22  --  19* 22 26  GLUCOSE 131* 134*  --  124* 163* 108*  BUN 15 16  --  26* 27* 28*  CREATININE 0.97 0.92  --  1.08* 1.09* 0.98  CALCIUM 8.5* 8.3*  --  8.7* 8.7* 8.7*  MG 1.8  --  1.9 2.1 1.9  --   PHOS 3.5  --   --   --   --   --    Recent Labs  Lab 11/14/22 0318 11/15/22 1049  AST 17 20  ALT 28 22  ALKPHOS 93 86  BILITOT 1.5* 1.0  PROT 6.4* 6.2*  ALBUMIN 3.0* 2.9*   Recent Labs    11/14/22 0927  HGBA1C 5.3   CBG: Recent Labs  Lab 11/16/22 1902 11/16/22 2139  GLUCAP 143* 97   Lipid Profile: No results for input(s): "CHOL", "HDL", "LDLCALC", "TRIG", "CHOLHDL", "LDLDIRECT" in the last 72 hours.  Thyroid Function Tests: Recent Labs    11/15/22 1049  FREET4 1.27*  Sepsis Labs: No results for input(s): "PROCALCITON", "LATICACIDVEN" in the last 168 hours.  Recent Results (from the past 240 hour(s))  MRSA Next Gen by PCR, Nasal     Status: None   Collection Time: 11/12/22  8:08 PM   Specimen: Nasal Mucosa; Nasal Swab  Result Value Ref Range Status   MRSA by PCR Next Gen NOT DETECTED NOT DETECTED Final    Comment: (NOTE) The GeneXpert MRSA Assay (FDA approved for NASAL specimens only), is one component of a comprehensive MRSA colonization surveillance program. It is not intended to diagnose MRSA infection nor to guide or monitor treatment for MRSA infections. Test performance is not FDA approved in patients less than 22 years old. Performed at [facility], [address] [place], [place] [address]     Antimicrobials: Anti-infectives (From admission, onward)    None      Culture/Microbiology No results found for: "SDES", "SPECREQUEST", "CULT", "REPTSTATUS"   Radiology Studies: DG Chest Port 1 View  Result Date: 11/15/2022 CLINICAL DATA:  Hypoxia EXAM: PORTABLE CHEST 1 VIEW COMPARISON:  11/12/2022 FINDINGS: Shallow inspiration. Mild  cardiac enlargement. Lungs are clear. No pleural effusions. No pneumothorax. Mediastinal contours appear intact. Degenerative changes in the spine and shoulders. IMPRESSION: Shallow inspiration.  No evidence of active pulmonary disease. Electronically Signed   By: Burman Nieves M.D.   On: 11/15/2022 15:28   DG Knee Right Port  Result Date: 11/15/2022 CLINICAL DATA:  Right knee pain. EXAM: PORTABLE RIGHT KNEE - 1-2 VIEW COMPARISON:  Right knee radiographs 06/06/2022 and 05/19/2014 FINDINGS: There is diffuse decreased bone mineralization. Postsurgical changes are again seen of total right knee arthroplasty. The knee is more extended on the current lateral view compared to the prior lateral view, and the mid AP dimension of the distal femoral prosthesis appears to be approximately 11 mm posterior to the mid AP dimension of the tibial prosthesis on this view. This appears changed compared to the immediate postoperative 05/19/2014 prior right knee lateral view. Otherwise, no perihardware lucency is seen to indicate hardware failure or loosening. No significant joint effusion. No acute fracture or dislocation. IMPRESSION: Postsurgical changes of total right knee arthroplasty. The mid AP dimension of the distal femoral prosthesis appears to be approximately 11 mm posterior to the mid AP dimension of the tibial prosthesis, suggesting new mild anterior subluxation of the tibial component with respect to the distal femoral component. Note is made this is seen on the lateral view with the patient in more extension positioning on the current radiographs compared to the most recent 06/06/2022 radiographs. Electronically Signed   By: Neita Garnet M.D.   On: 11/15/2022 13:27     LOS: 5 days   Lanae Boast, MD [facility]  11/17/2022, 9:22 AM

## 2022-11-17 NOTE — Progress Notes (Addendum)
Rounding Note    Patient Name: MAKAYELA HARNER Date of Encounter: 11/17/2022  [facility] Cardiologist: Dr Cristal Deer (remotely seen once by Dr. Rennis Golden in 2017)  Subjective   She is sitting recliner, states she has continue chest pain whenever she moves, even walking to the bathroom cause pain; she denied any SOB, dizziness, syncope, heart palpitation. She states she wants to have surgery for her aorta. She is tired of staying in the hospital. She had DVT in 2015 after knee surgery, denied any major bleeding in the past. No continuous IV meds given at this time.   Inpatient Medications    Scheduled Meds:  Chlorhexidine Gluconate Cloth  6 each Topical Daily   FLUoxetine  40 mg Oral Daily   heparin  5,000 Units Subcutaneous Q8H   latanoprost  1 drop Right Eye QHS   levothyroxine  88 mcg Oral QAC breakfast   lidocaine  1 patch Transdermal Q24H   metoprolol tartrate  12.5 mg Oral BID   rosuvastatin  10 mg Oral QPM   Continuous Infusions:  amiodarone Stopped (11/16/22 1524)   PRN Meds: docusate sodium, oxyCODONE, polyethylene glycol   Vital Signs    Vitals:   11/16/22 1949 11/16/22 2345 11/17/22 0500 11/17/22 0725  BP: 106/65 (!) 68/59 104/68 (!) 96/57  Pulse: 95 71 97 100  Resp: 18 18 18    Temp: 98.4 F (36.9 C) 98.4 F (36.9 C) 98.2 F (36.8 C) 98.4 F (36.9 C)  TempSrc: Oral Oral Oral Oral  SpO2: 96% 95%    Weight:   96.3 kg   Height:        Intake/Output Summary (Last 24 hours) at 11/17/2022 0749 Last data filed at 11/16/2022 1700 Gross per 24 hour  Intake 689.28 ml  Output 350 ml  Net 339.28 ml      11/17/2022    5:00 AM 11/16/2022    5:30 AM 11/15/2022    5:00 AM  Last 3 Weights  Weight (lbs) 212 lb 3.2 oz 211 lb 3.2 oz 210 lb 12.2 oz  Weight (kg) 96.253 kg 95.8 kg 95.6 kg      Telemetry    A fib RVR 100-120s - Personally Reviewed  ECG    N/A  - Personally Reviewed  Physical Exam   GEN: No acute distress.   Neck: No JVD Cardiac: RRR,  no murmurs, rubs, or gallops.  Respiratory: Clear to auscultation bilaterally. On room air.  GI: Soft, nontender, non-distended  MS: Trace BLE edema; No deformity. Neuro:  Nonfocal  Psych: Normal affect   Labs    High Sensitivity Troponin:   Recent Labs  Lab 11/12/22 1301 11/12/22 1504  TROPONINIHS 4 4     Chemistry Recent Labs  Lab 11/14/22 0318 11/14/22 0927 11/15/22 1049 11/16/22 0238 11/17/22 0235  NA 128* 131* 130* 130* 131*  K 4.2  --  4.5 4.4 4.6  CL 100  --  98 96* 96*  CO2 22  --  19* 22 26  GLUCOSE 134*  --  124* 163* 108*  BUN 16  --  26* 27* 28*  CREATININE 0.92  --  1.08* 1.09* 0.98  CALCIUM 8.3*  --  8.7* 8.7* 8.7*  MG  --  1.9 2.1 1.9  --   PROT 6.4*  --  6.2*  --   --   ALBUMIN 3.0*  --  2.9*  --   --   AST 17  --  20  --   --  ALT 28  --  22  --   --   ALKPHOS 93  --  86  --   --   BILITOT 1.5*  --  1.0  --   --   GFRNONAA >60  --  56* 56* >60  ANIONGAP 6  --  13 12 9     Lipids No results for input(s): "CHOL", "TRIG", "HDL", "LABVLDL", "LDLCALC", "CHOLHDL" in the last 168 hours.  Hematology Recent Labs  Lab 11/12/22 1301 11/13/22 0153 11/15/22 1049  WBC 13.1* 9.6 12.5*  RBC 4.73 4.32 4.48  HGB 14.6 13.5 14.2  HCT 46.2* 41.8 42.0  MCV 97.7 96.8 93.8  MCH 30.9 31.3 31.7  MCHC 31.6 32.3 33.8  RDW 12.3 12.3 12.6  PLT 216 164 212   Thyroid  Recent Labs  Lab 11/14/22 0318 11/15/22 1049  TSH 2.799  --   FREET4  --  1.27*    BNPNo results for input(s): "BNP", "PROBNP" in the last 168 hours.  DDimer  Recent Labs  Lab 11/12/22 1504  DDIMER 1.67*     Radiology    DG Chest Port 1 View  Result Date: 11/15/2022 CLINICAL DATA:  Hypoxia EXAM: PORTABLE CHEST 1 VIEW COMPARISON:  11/12/2022 FINDINGS: Shallow inspiration. Mild cardiac enlargement. Lungs are clear. No pleural effusions. No pneumothorax. Mediastinal contours appear intact. Degenerative changes in the spine and shoulders. IMPRESSION: Shallow inspiration.  No evidence of active  pulmonary disease. Electronically Signed   By: Burman Nieves M.D.   On: 11/15/2022 15:28   DG Knee Right Port  Result Date: 11/15/2022 CLINICAL DATA:  Right knee pain. EXAM: PORTABLE RIGHT KNEE - 1-2 VIEW COMPARISON:  Right knee radiographs 06/06/2022 and 05/19/2014 FINDINGS: There is diffuse decreased bone mineralization. Postsurgical changes are again seen of total right knee arthroplasty. The knee is more extended on the current lateral view compared to the prior lateral view, and the mid AP dimension of the distal femoral prosthesis appears to be approximately 11 mm posterior to the mid AP dimension of the tibial prosthesis on this view. This appears changed compared to the immediate postoperative 05/19/2014 prior right knee lateral view. Otherwise, no perihardware lucency is seen to indicate hardware failure or loosening. No significant joint effusion. No acute fracture or dislocation. IMPRESSION: Postsurgical changes of total right knee arthroplasty. The mid AP dimension of the distal femoral prosthesis appears to be approximately 11 mm posterior to the mid AP dimension of the tibial prosthesis, suggesting new mild anterior subluxation of the tibial component with respect to the distal femoral component. Note is made this is seen on the lateral view with the patient in more extension positioning on the current radiographs compared to the most recent 06/06/2022 radiographs. Electronically Signed   By: Neita Garnet M.D.   On: 11/15/2022 13:27    Cardiac Studies   Echo 11/14/2022:  1. Severely dilated aortic root; suggest CTA or MRA to further assess.   2. Left ventricular ejection fraction, by estimation, is 60 to 65%. The  left ventricle has normal function. The left ventricle has no regional  wall motion abnormalities. There is mild left ventricular hypertrophy.  Left ventricular diastolic function  could not be evaluated.   3. Right ventricular systolic function is normal. The right  ventricular  size is normal.   4. The mitral valve is normal in structure. No evidence of mitral valve  regurgitation. No evidence of mitral stenosis.   5. The aortic valve was not well visualized. Aortic valve regurgitation  is trivial. No aortic stenosis is present.   6. Aortic dilatation noted. There is severe dilatation of the aortic  root, measuring 52 mm.   7. The inferior vena cava is normal in size with greater than 50%  respiratory variability, suggesting right atrial pressure of 3 mmHg.   CTA chest 11/12/22:  1. Crescent shaped high-density fluid collection along the ascending thoracic aorta is concerning for intramural hematoma versus acute type A aortic dissection. Recommend emergent cardiothoracic surgical consultation. 2. No pericardial fluid or mediastinal mediastinal hematoma. 3. No acute pulmonary embolism.  Patient Profile   67 y.o. female with a hx of hypertension, hyperlipidemia, morbid obesity s/p gastric sleeve surgery 2017 and history of DVT, cardiology is following for new onset of atrial fibrillation RVR. CTA chest 5/11 showed  intramural hematoma versus acute type A aortic dissection, with a 5cm aortic root aneurysm. CT surgery following, recommend medical management for BP control given her lacking signs of rupture and super morbid obesity and other risk factors for surgery.   Assessment & Plan    Paroxysmal atrial fibrillation with RVR - initial episode 11/14/22, had difficult time controlling the heart rate as well as multiple boluses of IV amiodarone, currently off IV amiodarone,  asymptomatic, heart rate is elevated 100-120s today, also on metoprolol 12.5mg  BID, noted hypotensive episode 68/59 in the past 24 hours/no room to tritiate, would resume IV amiodarone today for rate control, may need short term PO use  - TSH WNL; Echo 5/13 with LVEF 60-65%, no RWMA, normal RV, severe dilatation of the aortic root, measuring 52 mm.  - CHADS2VASc 3, anticoagulation  held currently per CT surgery request, pending repeat CT today to re-assess type A aortic dissection/ intramural hematoma   Acute type A aortic dissection versus intramural hematoma  Aortic root aneurysm - per CTA chest 11/12/22, followed by CT surgery, felt no signs of rupture and high risk for surgery, recommend medical management, plan for repeat CT today per PCCM  - BP is adequately controlled, noted hypotensive episodes; on statin  - need long term follow up with CT surgery   HTN - on PTA telmisartan, held, BP low normal on metoprolol and amiodarone   HLD - on crestor   Hx of DVT Hypothyroidism  Morbid obesity - per primary team      For questions or updates, please contact [facility] Please consult [email or web address] for contact info under        Signed, Cyndi Bender, NP  11/17/2022, 7:49 AM

## 2022-11-18 ENCOUNTER — Inpatient Hospital Stay (HOSPITAL_COMMUNITY): Payer: 59

## 2022-11-18 DIAGNOSIS — I7101 Dissection of ascending aorta: Secondary | ICD-10-CM

## 2022-11-18 DIAGNOSIS — Z7189 Other specified counseling: Secondary | ICD-10-CM

## 2022-11-18 DIAGNOSIS — I480 Paroxysmal atrial fibrillation: Secondary | ICD-10-CM

## 2022-11-18 DIAGNOSIS — I7100 Dissection of unspecified site of aorta: Secondary | ICD-10-CM | POA: Diagnosis not present

## 2022-11-18 DIAGNOSIS — Z515 Encounter for palliative care: Secondary | ICD-10-CM

## 2022-11-18 DIAGNOSIS — I4891 Unspecified atrial fibrillation: Secondary | ICD-10-CM

## 2022-11-18 LAB — BASIC METABOLIC PANEL
Anion gap: 8 (ref 5–15)
BUN: 25 mg/dL — ABNORMAL HIGH (ref 8–23)
CO2: 25 mmol/L (ref 22–32)
Calcium: 8.6 mg/dL — ABNORMAL LOW (ref 8.9–10.3)
Chloride: 96 mmol/L — ABNORMAL LOW (ref 98–111)
Creatinine, Ser: 1.1 mg/dL — ABNORMAL HIGH (ref 0.44–1.00)
GFR, Estimated: 55 mL/min — ABNORMAL LOW (ref >60)
Glucose, Bld: 130 mg/dL — ABNORMAL HIGH (ref 70–99)
Potassium: 4.5 mmol/L (ref 3.5–5.1)
Sodium: 129 mmol/L — ABNORMAL LOW (ref 135–145)

## 2022-11-18 LAB — CBC
HCT: 38.9 % (ref 36.0–46.0)
Hemoglobin: 12.9 g/dL (ref 12.0–15.0)
MCH: 31.4 pg (ref 26.0–34.0)
MCHC: 33.2 g/dL (ref 30.0–36.0)
MCV: 94.6 fL (ref 80.0–100.0)
Platelets: 216 10*3/uL (ref 150–400)
RBC: 4.11 MIL/uL (ref 3.87–5.11)
RDW: 12.7 % (ref 11.5–15.5)
WBC: 11 10*3/uL — ABNORMAL HIGH (ref 4.0–10.5)
nRBC: 0 % (ref 0.0–0.2)

## 2022-11-18 MED ORDER — AMIODARONE HCL 200 MG PO TABS
400.0000 mg | ORAL_TABLET | Freq: Two times a day (BID) | ORAL | Status: DC
  Administered 2022-11-18 – 2022-11-19 (×3): 400 mg via ORAL
  Filled 2022-11-18 (×3): qty 2

## 2022-11-18 MED ORDER — IOHEXOL 350 MG/ML SOLN
75.0000 mL | Freq: Once | INTRAVENOUS | Status: AC | PRN
  Administered 2022-11-18: 75 mL via INTRAVENOUS

## 2022-11-18 NOTE — Progress Notes (Signed)
[address]       [name] [address]             [phone]       Subjective: No complaints at present, continues to have chest pressure with walking, eating and drinking  Objective: Vital signs in last 24 hours: Temp:  [98.6 F (37 C)-99.7 F (37.6 C)] 98.9 F (37.2 C) (05/17 1118) Pulse Rate:  [61-90] 90 (05/17 1118) Cardiac Rhythm: Atrial fibrillation;Bundle branch block (05/17 0820) Resp:  [20-21] 21 (05/17 1118) BP: (88-113)/(64-83) 110/83 (05/17 1118) SpO2:  [90 %-96 %] 92 % (05/17 1118) Weight:  [97.1 kg] 97.1 kg (05/17 0452)  Hemodynamic parameters for last 24 hours:    Intake/Output from previous day: No intake/output data recorded. Intake/Output this shift: No intake/output data recorded.  General appearance: alert, cooperative, and no distress Neurologic: intact Heart: regular rate and rhythm  CT ANGIOGRAPHY CHEST, ABDOMEN AND PELVIS   TECHNIQUE: Non-contrast CT of the chest was initially obtained.   Multidetector CT imaging through the chest, abdomen and pelvis was performed using the standard protocol during bolus administration of intravenous contrast. Multiplanar reconstructed images and MIPs were obtained and reviewed to evaluate the vascular anatomy.   RADIATION DOSE REDUCTION: This exam was performed according to the departmental dose-optimization program which includes automated exposure control, adjustment of the mA and/or kV according to patient size and/or use of iterative reconstruction technique.   CONTRAST:  75mL OMNIPAQUE IOHEXOL 350 MG/ML SOLN   COMPARISON:  11/12/2022   FINDINGS: CTA CHEST FINDINGS   Cardiovascular: There is a stable fusiform ascending thoracic aortic aneurysm measuring up to 4.7 cm in diameter, beginning at the aortic root and extending to the aortic arch. The crescentic intramural hematoma seen throughout the thoracic aorta on prior study is again identified, increased since the prior  exam. For example, along the right lateral margin of the ascending thoracic aorta of the intramural hematoma measures up to 1.1 cm in thickness, reference image 36/7, previously measuring 0.8 cm. Also, along the anterior margin of the descending thoracic aorta, the intramural hematoma now measures up to 0.9 cm, previously measuring 0.6 cm. I do not see any frank dissection or focal ulcerative plaque at this time.   The heart is unremarkable without pericardial effusion. No evidence of pulmonary emboli.   Mediastinum/Nodes: No enlarged mediastinal, hilar, or axillary lymph nodes. Thyroid gland, trachea, and esophagus demonstrate no significant findings.   Lungs/Pleura: Interval development of a small left pleural effusion, volume estimated less than 500 cc. Mild compressive atelectasis within the left lower lobe. No acute airspace disease or pneumothorax. Minimal scarring or subsegmental atelectasis is again seen within the bilateral upper lobes. Central airways are patent.   Musculoskeletal: Subacute to chronic bilateral rib fractures are unchanged. No acute or destructive bony abnormalities. Reconstructed images demonstrate no additional findings.   Review of the MIP images confirms the above findings.   CTA ABDOMEN AND PELVIS FINDINGS   VASCULAR   Aorta: Intramural hematoma is seen along the left lateral aspect of the aorta at the level of the diaphragmatic hiatus, measuring 0.8 cm in thickness. The remainder of the abdominal aorta is unremarkable, without evidence of aneurysm, dissection, or critical stenosis. Diffuse atherosclerosis.   Celiac: Patent without evidence of aneurysm, dissection, vasculitis or significant stenosis. Calcified atheromatous plaque at the origin of the celiac axis.   SMA: Patent without evidence of aneurysm, dissection, vasculitis or significant stenosis.   Renals: There is a  single left renal artery, with duplicated right renal arteries.  There is atherosclerosis at the origin of the bilateral renal arteries, without evidence of critical stenosis. No aneurysm, dissection, or vasculitis.   IMA: Patent without evidence of aneurysm, dissection, vasculitis or significant stenosis.   Inflow: Patent without evidence of aneurysm, dissection, vasculitis or significant stenosis.   Veins: No obvious venous abnormality within the limitations of this arterial phase study.   Review of the MIP images confirms the above findings.   NON-VASCULAR   Hepatobiliary: Small calcified gallstones without cholecystitis. The liver is unremarkable. No biliary duct dilation.   Pancreas: Unremarkable. No pancreatic ductal dilatation or surrounding inflammatory changes.   Spleen: Normal in size without focal abnormality.   Adrenals/Urinary Tract: There is bilateral renal cortical scarring and atrophy, right greater than left. Nonobstructing 1 cm calculus lower pole left kidney. No obstructive uropathy within either kidney. The adrenals are stable. Bladder is grossly unremarkable.   Stomach/Bowel: No bowel obstruction or ileus. Normal appendix right lower quadrant. No bowel wall thickening or inflammatory change. Postsurgical changes from prior bariatric surgery.   Lymphatic: No pathologic adenopathy within the abdomen or pelvis.   Reproductive: Status post hysterectomy. No adnexal masses.   Other: No free fluid or free intraperitoneal gas. No abdominal wall hernia.   Musculoskeletal: Right hip arthroplasty. No acute or destructive bony lesions. Reconstructed images demonstrate no additional findings.   Review of the MIP images confirms the above findings.   IMPRESSION: Vascular:   1. Progression of the intramural hematoma seen throughout the thoracic aorta on prior study, now measuring up to 11 mm within the ascending thoracic aorta and 9 mm within the descending thoracic aorta. There is no evidence of dissection flap or  ulcerative plaque. Vascular surgery consultation again recommended. 2. Stable 4.7 cm ascending thoracic aortic aneurysm, beginning at the aortic root. 3. No evidence of pulmonary embolus. 4.  Aortic Atherosclerosis (ICD10-I70.0).   Nonvascular:   1. Interval development of small left pleural effusion, volume estimated 500 cc. Minimal left lower lobe compressive atelectasis. 2. Bilateral renal cortical scarring and atrophy, right greater than left, unchanged.   Critical Value/emergent results were called by telephone at the time of interpretation on 11/18/2022 at 3:51 pm to provider [facility] , who verbally acknowledged these results.     Electronically Signed   By: Sharlet Salina M.D.   On: 11/18/2022 15:57 I reviewed the Ct images.  Some mild changes in the 6 days since prior images.   Assessment/Plan: 67 yo woman with multiple medical issues who presented a week ago with an aortic intramural hematoma. Seen by Dr. Laneta Simmers at that time.  Not an operative candidate due to co-morbidities.  Continues to have atypical CP when walking, eating or drinking.  Describes as a pressure not a sharp pain.  Not typical for aortic related pain which should have no postprandial component and is not exertional.  Usually a sharp pain as well.  Was made DNR.  Would investigate other potentially reversible causes of her pain  Eliquis is obviously concerning in the setting of any aortic pathology.  Will have to weigh risk of stroke vs risk of rupture/ bleeding.  LOS: 6 days    Loreli Slot 11/18/2022

## 2022-11-18 NOTE — Consult Note (Signed)
Palliative Medicine Inpatient Consult Note  Consulting Provider: Dr. Jonathon Bellows  Reason for consult: Code stattus, GOC, pain control  11/18/2022  HPI:  Per intake H&P --> 67 year old woman with a past medical history of hypothyroidism, hypertension, and hyperlipidemia who presents with chest pain. Identified on CT to have A aortic dissection, with a 5cm aortic root aneurysm. Patient is presently not a surgical candidate per CTS.  Palliative care asked to get involved to further support goals of care conversations.   Clinical Assessment/Goals of Care:  *Please note that this is a verbal dictation therefore any spelling or grammatical errors are due to the "Dragon Medical One" system interpretation.  I have reviewed medical records including EPIC notes, labs and imaging, received report from bedside RN, assessed the patient who is lying in bed in no acute distress.    I met with Lauren Clark to further discuss diagnosis prognosis, GOC, EOL wishes, disposition and options.   I introduced Palliative Medicine as specialized medical care for people living with serious illness. It focuses on providing relief from the symptoms and stress of a serious illness. The goal is to improve quality of life for both the patient and the family.  Medical History Review and Understanding:  A review of Lauren Clark's past medical history inclusive of blindness of the left eye and degenerative myopia of the right eye, hypertension hyperlipidemia, & hypothyroidism was held.  Social History:  Lauren Clark shares with me that she is from [place] originally and moved to [place].  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.Lauren Clark is a widow and has never had children.  She shares with me that she has a cat named Felicie Morn who she adores.  She is a woman of faith and practices within Buddhism.  Functional and Nutritional State:  Preceding  hospitalization Lauren Clark was living in an apartment.  She was able to participate in all B ADLs and IADLs independently.  Advance Directives:  A detailed discussion was had today regarding advanced directives.  Patient is agreeable to making a healthcare power of attorney  Code Status:  Concepts specific to code status, artifical feeding and hydration, continued IV antibiotics and rehospitalization was had.  The difference between a aggressive medical intervention path  and a palliative comfort care path for this patient at this time was had.   Encouraged patient/family to consider DNR/DNI status understanding evidenced based poor outcomes in similar hospitalized patient, as the cause of arrest is likely associated with advanced chronic/terminal illness rather than an easily reversible acute cardio-pulmonary event. I explained that DNR/DNI does not change the medical plan and it only comes into effect after a person has arrested (died).  It is a protective measure to keep [place] from harming the patient in their last moments of life.  Lauren Clark was agreeable to DNR/DNI with understanding that patient would not receive CPR, defibrillation, ACLS medications, or intubation.   Discussion:  Lauren Clark and I discussed her present health state. She shared with me that she has an aortic dissection. She further shares that she cannot have surgery per Dr. Laneta Simmers although she "wish she could". We reviewed that she is high risk for surgery which she understands very well. She also realizes if she ever desired a second opinion she could pursue that in another health care system. I spoke to her about her goals from here. She shares that she would like to go back home  and live a functional life. She shares that it is hard for her to tell her friends that she is in such a tenuous place from a health perspective. We further discussed that if [facility] health does get to a point whereby her symptom burden surpasses her ability to  have quality in her life then hospice would be a reasonable consideration.   I described hospice as a service for patients who have a life expectancy of 6 months or less.  The goal of hospice is the preservation of dignity and quality at the end phases of life. Under hospice care, the focus changes from curative to symptom relief.   Offered time and support given the difficulty of the conversation(s).   Decision Maker: [name] Clearence Cheek   [phone]   SUMMARY OF RECOMMENDATIONS   DNAR/DNI  Discussed differences between Palliative care and hospice care. Discussed OP Palliative support on discharge though if worsens from a symptom perspective the idea of comfort oriented/hospice care  Reviewed severity of patients illness - aortic aneurysm  Plan for CTA this afternoon to determine resumption of anticoagulation  Ongoing support by PMT during hospitalization  Code Status/Advance Care Planning: DNAR/DNI   Symptom Management:  Dyspnea on Exertion: - Can consider low dose opiod for  Palliative Prophylaxis:  Aspiration, Bowel Regimen, Delirium Protocol, Frequent Pain Assessment, Oral Care, Palliative Wound Care, and Turn Reposition  Additional Recommendations (Limitations, Scope, Preferences): Continue present care  Psycho-social/Spiritual:  Desire for further Chaplaincy support: Patient is Buddhist Additional Recommendations: Education on aortic aneurysm risks   Prognosis: Patient has a high 12 month mortality risk.   Discharge Planning: Discharge plan is uncertain at this time.   Vitals:   11/18/22 0321 11/18/22 1118  BP: 113/67 110/83  Pulse: 73 90  Resp: 20 (!) 21  Temp: 99.6 F (37.6 C) 98.9 F (37.2 C)  SpO2: 90% 92%   No intake or output data in the 24 hours ending 11/18/22 1439 Last Weight  Most recent update: 11/18/2022  4:53 AM    Weight  97.1 kg (214 lb)            Gen:  Middle aged Caucasian F in NAD HEENT: moist mucous membranes CV: Irregular  rate and rhythm  PULM:  On RA, breathing is even and nonlabored ABD: soft/nontender  EXT: No edema  Neuro: Alert and oriented x3   PPS: 50-60%   This conversation/these recommendations were discussed with patient primary care team, Dr. Jonathon Bellows  Billing based on MDM: High  Problems Addressed: One acute or chronic illness or injury that poses a threat to life or bodily function  Amount and/or Complexity of Data: Category 3:Discussion of management or test interpretation with external physician/other qualified health care professional/appropriate source (not separately reported)  Risks: Decision not to resuscitate or to de-escalate care because of poor prognosis ______________________________________________________ Lamarr Lulas [facility] Palliative Medicine Team Team Cell Phone: [phone] Please utilize secure chat with additional questions, if there is no response within 30 minutes please call the above phone number  Palliative Medicine Team providers are available by phone from 7am to 7pm daily and can be reached through the team cell phone.  Should this patient require assistance outside of these hours, please call the patient's attending physician.

## 2022-11-18 NOTE — Progress Notes (Signed)
Rounding Note    Patient Name: Lauren Clark Date of Encounter: 11/18/2022  [facility] Cardiologist: Dr Cristal Deer (remotely seen once by Dr. Rennis Golden in 2017)  Subjective   Patient states she continue to have chest pain, when she moves and when she eats. She felt her symptoms had no improvement. She has no resting pain. She denied any heart palpitation or dizziness or SOB. She states the surgeon recommend against surgery and she is wondering when can she go home.   Inpatient Medications    Scheduled Meds:  amiodarone  400 mg Oral BID   FLUoxetine  40 mg Oral Daily   heparin  5,000 Units Subcutaneous Q8H   latanoprost  1 drop Right Eye QHS   levothyroxine  88 mcg Oral QAC breakfast   lidocaine  1 patch Transdermal Q24H   metoprolol tartrate  12.5 mg Oral BID   rosuvastatin  10 mg Oral QPM   senna-docusate  1 tablet Oral BID   traZODone  50 mg Oral QHS   Continuous Infusions:  amiodarone 30 mg/hr (11/17/22 2041)   lactated ringers 50 mL/hr at 11/17/22 1917   PRN Meds: alum & mag hydroxide-simeth, docusate sodium, HYDROmorphone (DILAUDID) injection, oxyCODONE, polyethylene glycol   Vital Signs    Vitals:   11/18/22 0015 11/18/22 0017 11/18/22 0321 11/18/22 0452  BP:  (!) 88/64 113/67   Pulse:  61 73   Resp:  20 20   Temp: 99.7 F (37.6 C) 99.7 F (37.6 C) 99.6 F (37.6 C)   TempSrc: Oral Oral Oral   SpO2:  91% 90%   Weight:    97.1 kg  Height:       No intake or output data in the 24 hours ending 11/18/22 0807     11/18/2022    4:52 AM 11/17/2022    5:00 AM 11/16/2022    5:30 AM  Last 3 Weights  Weight (lbs) 214 lb 212 lb 3.2 oz 211 lb 3.2 oz  Weight (kg) 97.07 kg 96.253 kg 95.8 kg      Telemetry    A fib RVR 100-110s - Personally Reviewed  ECG    N/A  - Personally Reviewed  Physical Exam   GEN: No acute distress.   Neck: No JVD Cardiac: Irregularly irregular, no murmurs, rubs, or gallops.  Respiratory: Clear to auscultation  bilaterally. On room air.  GI: Soft, nontender, non-distended  MS: Trace BLE edema; No deformity. Neuro:  Nonfocal  Psych: Normal affect   Labs    High Sensitivity Troponin:   Recent Labs  Lab 11/12/22 1301 11/12/22 1504  TROPONINIHS 4 4     Chemistry Recent Labs  Lab 11/14/22 0318 11/14/22 0927 11/15/22 1049 11/16/22 0238 11/17/22 0235 11/18/22 0357  NA 128* 131* 130* 130* 131* 129*  K 4.2  --  4.5 4.4 4.6 4.5  CL 100  --  98 96* 96* 96*  CO2 22  --  19* 22 26 25   GLUCOSE 134*  --  124* 163* 108* 130*  BUN 16  --  26* 27* 28* 25*  CREATININE 0.92  --  1.08* 1.09* 0.98 1.10*  CALCIUM 8.3*  --  8.7* 8.7* 8.7* 8.6*  MG  --  1.9 2.1 1.9  --   --   PROT 6.4*  --  6.2*  --   --   --   ALBUMIN 3.0*  --  2.9*  --   --   --   AST 17  --  20  --   --   --   ALT 28  --  22  --   --   --   ALKPHOS 93  --  86  --   --   --   BILITOT 1.5*  --  1.0  --   --   --   GFRNONAA >60  --  56* 56* >60 55*  ANIONGAP 6  --  13 12 9 8     Lipids No results for input(s): "CHOL", "TRIG", "HDL", "LABVLDL", "LDLCALC", "CHOLHDL" in the last 168 hours.  Hematology Recent Labs  Lab 11/13/22 0153 11/15/22 1049 11/18/22 0357  WBC 9.6 12.5* 11.0*  RBC 4.32 4.48 4.11  HGB 13.5 14.2 12.9  HCT 41.8 42.0 38.9  MCV 96.8 93.8 94.6  MCH 31.3 31.7 31.4  MCHC 32.3 33.8 33.2  RDW 12.3 12.6 12.7  PLT 164 212 216   Thyroid  Recent Labs  Lab 11/14/22 0318 11/15/22 1049  TSH 2.799  --   FREET4  --  1.27*    BNPNo results for input(s): "BNP", "PROBNP" in the last 168 hours.  DDimer  Recent Labs  Lab 11/12/22 1504  DDIMER 1.67*     Radiology    No results found.  Cardiac Studies   Echo 11/14/2022:  1. Severely dilated aortic root; suggest CTA or MRA to further assess.   2. Left ventricular ejection fraction, by estimation, is 60 to 65%. The  left ventricle has normal function. The left ventricle has no regional  wall motion abnormalities. There is mild left ventricular hypertrophy.   Left ventricular diastolic function  could not be evaluated.   3. Right ventricular systolic function is normal. The right ventricular  size is normal.   4. The mitral valve is normal in structure. No evidence of mitral valve  regurgitation. No evidence of mitral stenosis.   5. The aortic valve was not well visualized. Aortic valve regurgitation  is trivial. No aortic stenosis is present.   6. Aortic dilatation noted. There is severe dilatation of the aortic  root, measuring 52 mm.   7. The inferior vena cava is normal in size with greater than 50%  respiratory variability, suggesting right atrial pressure of 3 mmHg.   CTA chest 11/12/22:  1. Crescent shaped high-density fluid collection along the ascending thoracic aorta is concerning for intramural hematoma versus acute type A aortic dissection. Recommend emergent cardiothoracic surgical consultation. 2. No pericardial fluid or mediastinal mediastinal hematoma. 3. No acute pulmonary embolism.  Patient Profile   67 y.o. female with a hx of hypertension, hyperlipidemia, morbid obesity s/p gastric sleeve surgery 2017 and history of DVT, cardiology is following for new onset of atrial fibrillation RVR. CTA chest 5/11 showed  intramural hematoma versus acute type A aortic dissection, with a 5cm aortic root aneurysm. CT surgery following, recommend medical management for BP control given her lacking signs of rupture and super morbid obesity and other risk factors for surgery.   Assessment & Plan    Paroxysmal atrial fibrillation with RVR - initial episode 11/14/22, had difficult time controlling the heart rate as well as multiple boluses of IV amiodarone, currently on IV amiodarone,  asymptomatic, heart rate is elevated 100-110s today, also on metoprolol 12.5mg  BID, BP remains low normal/no room to tritiate, will transition to PO amiodarone today 400mg  BID for 7 days then 200mg  daily after  - TSH WNL; Echo 5/13 with LVEF 60-65%, no RWMA,  normal RV, severe dilatation of the aortic root,  measuring 52 mm.  - CHADS2VASc 3, anticoagulation held currently per CT surgery request, pending repeat CT today to re-assess type A aortic dissection/ intramural hematoma   Acute type A aortic dissection versus intramural hematoma  Aortic root aneurysm - per CTA chest 11/12/22, followed by CT surgery, felt no signs of rupture and high risk for surgery, recommend medical management, pending repeat CT  - BP is adequately controlled, noted hypotensive episodes; on statin  - need long term follow up with CT surgery   HTN - on PTA telmisartan, held, BP low normal on metoprolol and amiodarone   HLD - on crestor   Hx of DVT Hypothyroidism  Morbid obesity - per primary team      For questions or updates, please contact [facility] Please consult [email or web address] for contact info under        Signed, Cyndi Bender, NP  11/18/2022, 8:07 AM

## 2022-11-18 NOTE — Progress Notes (Signed)
PROGRESS NOTE BAYLA MCGAHA  ZOX:[id number] DOB: Jul 17, 1955 DOA: 11/12/2022 PCP: Ellyn Hack, MD  Brief Narrative/Hospital Course:  67 year old woman with a past medical history of hypothyroidism, hypertension, and hyperlipidemia who presents with chest pain of acute onset  after waking up from her sleep around 6 AM. Patient came to the ED had worsening of symptoms- D-dimer was elevated so CT PE study was obtained> showed no pulmonary embolism but did show intramural thrombus of the ascending aorta versus type aortic dissection. She was found to be hypertensive. Her chest pain has gotten a little better with blood pressure control. CT surgery was consulted and recommended admission to the ICU and medical management of blood pressure.   Patient was admitted to ICU 5/11 with intramural hematoma versus dissection.  Seen by cardiology, had A-fib with RVR -managed with amiodarone, subsequently off Cardene drip,Patient has subluxed right knee Ortho was consulted advised knee immobilizer WBAT and eventually needs revision and will follow-up with Dr. Charlann Boxer after discharge Echo showed severely dilated root 52 mm EF 60 to 65% no RWMA indeterminate diastolic function, BP improved in 90s heart rate better. Seen by Dr. Lavinia Sharps stable hemodynamics, advised to hold off anticoagulation. Plan to repeat CT    Subjective:  Seen and examined this morning resting comfortably but does complain of chest pain morbidity and moving  Vitals are stable BP soft 80s to 100s stable, on room air   Assessment and Plan: Principal Problem:   Aortic dissection (HCC)   Aortic hematoma versus type B dissection with thrombosed false lumen Chest pain and back pain Hypertensive emergency: Per cardiothoracic surgery patient is a high risk for surgical intervention advised medical management/BP optimization. Dr. Lavinia Sharps advised against anticoagulation on his last progress note- sent message to Dr. Laneta Simmers and per his recommendation  repeat CT angio has been ordered to evalaute before resuming anticoagulation with Eliquis. BP remains well-controlled. Continues to have chest pain while eating and mobilizing, continue oxycodone/IV Dilaudid for pain control added Maalox.    A-fib with RVR, new onset: Heart rate controlled on, amio being transitioned to oral, cardiology following and appreciate input.   Acute hypoxic respiratory failure secondary to hypoventilation chest x-ray low volume.  Resolved  Hyponatremia: Monitor sodium level  in 129-130 Hyperlipidemia: Continue statin Hypothyroidism: Continue Synthroid.  Severe arthritis A1c knee pain, right Previous multiple bilateral knee surgeries: Imaging suggest new mild anterior subluxation of the tibial component cont pain control w/ oxy IR prn lidocaine patch prn , PCCM Spoke w/ Ortho: advised to cont Knee immobilizer, WBAT. Can have KI off when not ambulating. She needs a revision on that knee once she's more stable. She can see Charlann Boxer once she gets discharged  Morbid obesity Patient's Body mass index is 41.79 kg/m. : Will benefit with PCP follow-up, weight loss  healthy lifestyle and outpatient sleep evaluation.  Goals of care: Currently full code, she is however at high risk of decompensation.Palliative care has been consulted currently full code.Consider DNR/DNI understanding evidenced-based poor outcomes in similar hospitalized ptient, as a cause of arrest is likely associated with advanced chronic illness rather than easily reversible acute cardiopulmonary.  Has complex medical comorbidity and prognosis does not appear bright   DVT prophylaxis: heparin injection 5,000 Units Start: 11/12/22 2200 SCDs Start: 11/12/22 1853 Code Status:   Code Status: Full Code Family Communication: plan of care discussed with patient at bedside. Patient status is:  inpatient because of aortic dissection Level of care: Progressive   Dispo: The patient is  from: home lives alone             Anticipated disposition: TBD.  PT OT pending Objective: Vitals last 24 hrs: Vitals:   11/18/22 0017 11/18/22 0321 11/18/22 0452 11/18/22 1118  BP: (!) 88/64 113/67  110/83  Pulse: 61 73  90  Resp: 20 20  (!) 21  Temp: 99.7 F (37.6 C) 99.6 F (37.6 C)  98.9 F (37.2 C)  TempSrc: Oral Oral  Oral  SpO2: 91% 90%  92%  Weight:   97.1 kg   Height:       Weight change: 0.816 kg  Physical Examination: General exam: AAOX3, weak,older appearing HEENT:Oral mucosa moist, Ear/Nose WNL grossly, dentition normal. Respiratory system: bilaterally CLEAR BS, no use of accessory muscle Cardiovascular system: S1 & S2 +, regular rate,. Gastrointestinal system: Abdomen soft, NT,ND,BS+ Nervous System:Alert, awake, moving extremities and grossly nonfocal Extremities: LE ankle edema NEG, lower extremities warm Skin: No rashes,no icterus. ZOX:WRUEAV muscle bulk,tone, power   Medications reviewed:  Scheduled Meds:  amiodarone  400 mg Oral BID   FLUoxetine  40 mg Oral Daily   heparin  5,000 Units Subcutaneous Q8H   latanoprost  1 drop Right Eye QHS   levothyroxine  88 mcg Oral QAC breakfast   lidocaine  1 patch Transdermal Q24H   metoprolol tartrate  12.5 mg Oral BID   rosuvastatin  10 mg Oral QPM   senna-docusate  1 tablet Oral BID   traZODone  50 mg Oral QHS  Continuous Infusions:  lactated ringers 50 mL/hr at 11/17/22 1917    Diet Order             Diet heart healthy/carb modified Room service appropriate? Yes with Assist; Fluid consistency: Thin  Diet effective now                 No intake or output data in the 24 hours ending 11/18/22 1144 Net IO Since Admission: -5,559.34 mL [11/18/22 1144]  Wt Readings from Last 3 Encounters:  11/18/22 97.1 kg  06/06/22 91.6 kg  09/05/17 88.9 kg    Unresulted Labs (From admission, onward)     Start     Ordered   11/18/22 0500  Basic metabolic panel  Daily,   R     Question:  Specimen collection method  Answer:  Lab=Lab collect    11/17/22 1652   11/18/22 0500  CBC  Daily,   R     Question:  Specimen collection method  Answer:  Lab=Lab collect   11/17/22 1652          Data Reviewed: I have personally reviewed following labs and imaging studies CBC: Recent Labs  Lab 11/12/22 1301 11/13/22 0153 11/15/22 1049 11/18/22 0357  WBC 13.1* 9.6 12.5* 11.0*  HGB 14.6 13.5 14.2 12.9  HCT 46.2* 41.8 42.0 38.9  MCV 97.7 96.8 93.8 94.6  PLT 216 164 212 216    Basic Metabolic Panel: Recent Labs  Lab 11/13/22 0153 11/14/22 0318 11/14/22 0927 11/15/22 1049 11/16/22 0238 11/17/22 0235 11/18/22 0357  NA 135 128* 131* 130* 130* 131* 129*  K 4.5 4.2  --  4.5 4.4 4.6 4.5  CL 102 100  --  98 96* 96* 96*  CO2 23 22  --  19* 22 26 25   GLUCOSE 131* 134*  --  124* 163* 108* 130*  BUN 15 16  --  26* 27* 28* 25*  CREATININE 0.97 0.92  --  1.08* 1.09* 0.98 1.10*  CALCIUM  8.5* 8.3*  --  8.7* 8.7* 8.7* 8.6*  MG 1.8  --  1.9 2.1 1.9  --   --   PHOS 3.5  --   --   --   --   --   --     Recent Labs  Lab 11/14/22 0318 11/15/22 1049  AST 17 20  ALT 28 22  ALKPHOS 93 86  BILITOT 1.5* 1.0  PROT 6.4* 6.2*  ALBUMIN 3.0* 2.9*    No results for input(s): "HGBA1C" in the last 72 hours.  CBG: Recent Labs  Lab 11/16/22 1902 11/16/22 2139  GLUCAP 143* 97    Recent Results (from the past 240 hour(s))  MRSA Next Gen by PCR, Nasal     Status: None   Collection Time: 11/12/22  8:08 PM   Specimen: Nasal Mucosa; Nasal Swab  Result Value Ref Range Status   MRSA by PCR Next Gen NOT DETECTED NOT DETECTED Final    Comment: (NOTE) The GeneXpert MRSA Assay (FDA approved for NASAL specimens only), is one component of a comprehensive MRSA colonization surveillance program. It is not intended to diagnose MRSA infection nor to guide or monitor treatment for MRSA infections. Test performance is not FDA approved in patients less than 65 years old. Performed at [facility], [address], [place], [place] [address]     Antimicrobials: Anti-infectives (From admission, onward)    None      Culture/Microbiology No results found for: "SDES", "SPECREQUEST", "CULT", "REPTSTATUS"   Radiology Studies: No results found.   LOS: 6 days   Lanae Boast, MD [facility]  11/18/2022, 11:44 AM

## 2022-11-19 DIAGNOSIS — I7100 Dissection of unspecified site of aorta: Secondary | ICD-10-CM | POA: Diagnosis not present

## 2022-11-19 LAB — CBC
HCT: 37.4 % (ref 36.0–46.0)
Hemoglobin: 12.6 g/dL (ref 12.0–15.0)
MCH: 31.8 pg (ref 26.0–34.0)
MCHC: 33.7 g/dL (ref 30.0–36.0)
MCV: 94.4 fL (ref 80.0–100.0)
Platelets: 221 10*3/uL (ref 150–400)
RBC: 3.96 MIL/uL (ref 3.87–5.11)
RDW: 12.6 % (ref 11.5–15.5)
WBC: 11 10*3/uL — ABNORMAL HIGH (ref 4.0–10.5)
nRBC: 0 % (ref 0.0–0.2)

## 2022-11-19 LAB — BASIC METABOLIC PANEL
Anion gap: 11 (ref 5–15)
BUN: 21 mg/dL (ref 8–23)
CO2: 23 mmol/L (ref 22–32)
Calcium: 8.6 mg/dL — ABNORMAL LOW (ref 8.9–10.3)
Chloride: 95 mmol/L — ABNORMAL LOW (ref 98–111)
Creatinine, Ser: 1.06 mg/dL — ABNORMAL HIGH (ref 0.44–1.00)
GFR, Estimated: 58 mL/min — ABNORMAL LOW (ref >60)
Glucose, Bld: 114 mg/dL — ABNORMAL HIGH (ref 70–99)
Potassium: 4.6 mmol/L (ref 3.5–5.1)
Sodium: 129 mmol/L — ABNORMAL LOW (ref 135–145)

## 2022-11-19 MED ORDER — ALUMINUM & MAGNESIUM HYDROXIDE 200-200 MG/5ML PO SUSP: 15.0000 mL | Freq: Four times a day (QID) | ORAL | 0 refills | Status: AC | PRN

## 2022-11-19 MED ORDER — AMIODARONE HCL 200 MG PO TABS: 200.0000 mg | ORAL_TABLET | Freq: Every day | ORAL | 0 refills | Status: DC

## 2022-11-19 MED ORDER — METOPROLOL TARTRATE 25 MG PO TABS: 12.5000 mg | ORAL_TABLET | Freq: Two times a day (BID) | ORAL | 0 refills | Status: DC

## 2022-11-19 MED ORDER — HYDROCODONE-ACETAMINOPHEN 5-325 MG PO TABS: 1.0000 | ORAL_TABLET | Freq: Four times a day (QID) | ORAL | 0 refills | Status: DC | PRN

## 2022-11-19 MED ORDER — PANTOPRAZOLE SODIUM 40 MG PO TBEC: 40.0000 mg | DELAYED_RELEASE_TABLET | Freq: Every day | ORAL | 1 refills | Status: DC

## 2022-11-19 NOTE — Progress Notes (Addendum)
   Palliative Medicine Inpatient Follow Up Note HPI: 67 year old woman with a past medical history of hypothyroidism, hypertension, and hyperlipidemia who presents with chest pain. Identified on CT to have A aortic dissection, with a 5cm aortic root aneurysm. Patient is presently not a surgical candidate per CTS.   Palliative care asked to get involved to further support goals of care conversations.   Today's Discussion 11/19/2022  *Please note that this is a verbal dictation therefore any spelling or grammatical errors are due to the "[employer]" system interpretation.  Chart reviewed inclusive of vital signs, progress notes, laboratory results, and diagnostic images.   I met with Lauren Clark this late morning. She shares with me that she wants to go home as the medical team is not operating or placing her back on her blood thinners. We reviewed that I would speak to the medical team and reflect her wishes to them.   Created space and opportunity for patient to explore thoughts feelings and fears regarding current medical situation. She is very tearful during my time with her. She is interested in getting a second opinion though inhibited by not being able to get a ride. We discussed her friends who may be able to give her a ride though she does not want to ask them.  Per patients RN, Earnest Armfield has done well from a functional standpoint and is able to get up from the bed with little assistance. She is functioning well and could likely transition to her home environment.   Questions and concerns addressed/Palliative Support Provided.   Objective Assessment: Vital Signs Vitals:   11/19/22 0435 11/19/22 0744  BP: 91/65 (!) 85/57  Pulse:  90  Resp:  20  Temp: 98.7 F (37.1 C) 98 F (36.7 C)  SpO2: 90% 91%    Intake/Output Summary (Last 24 hours) at 11/19/2022 1258 Last data filed at 11/19/2022 0830 Gross per 24 hour  Intake 1132.83 ml  Output --  Net 1132.83 ml   Last  Weight  Most recent update: 11/19/2022  4:37 AM    Weight  98.2 kg (216 lb 9.6 oz)            Gen:  Middle aged Caucasian F in NAD HEENT: moist mucous membranes CV: Irregular rate and rhythm  PULM:  On RA, breathing is even and nonlabored ABD: soft/nontender  EXT: No edema  Neuro: Alert and oriented x3   SUMMARY OF RECOMMENDATIONS   DNAR/DNI  Reviewed severity of patients illness - aortic aneurysm  Per Cardiology team would not recommend resuming AC at this time  TOC - OP Palliative support on discharge   Ongoing support by PMT during hospitalization  Symptom Management:  Dyspnea on Exertion: - Can consider low dose opiod - dilaudid 1 mg PO Q4H PRN - HR/Afib control per cardiology  Billing based on MDM: Moderate ______________________________________________________________________________________ Lamarr Lulas [place] Palliative Medicine Team Team Cell Phone: [phone] Please utilize secure chat with additional questions, if there is no response within 30 minutes please call the above phone number  Palliative Medicine Team providers are available by phone from 7am to 7pm daily and can be reached through the team cell phone.  Should this patient require assistance outside of these hours, please call the patient's attending physician.

## 2022-11-19 NOTE — Discharge Summary (Signed)
Physician Discharge Summary  Lauren Clark ZOX:[id number] DOB: 17-Aug-1955 DOA: 11/12/2022  PCP: Ellyn Hack, MD  Admit date: 11/12/2022 Discharge date: 11/19/2022 Recommendations for Outpatient Follow-up:  Follow up with PCP in 1 weeks-call for appointment Please obtain BMP/CBC in one week  Discharge Dispo: Home Discharge Condition: Stable Code Status:   Code Status: DNR Diet recommendation:  Diet Order             Diet heart healthy/carb modified Room service appropriate? Yes with Assist; Fluid consistency: Thin  Diet effective now                    Brief/Interim Summary: 67 year old woman with a past medical history of hypothyroidism, hypertension, and hyperlipidemia who presents with chest pain of acute onset  after waking up from her sleep around 6 AM. Patient came to the ED had worsening of symptoms- D-dimer was elevated so CT PE study was obtained> showed no pulmonary embolism but did show intramural thrombus of the ascending aorta versus type aortic dissection. She was found to be hypertensive. Her chest pain has gotten a little better with blood pressure control. CT surgery was consulted and recommended admission to the ICU and medical management of blood pressure.  Patient was admitted to ICU 5/11 with intramural hematoma versus dissection.  Seen by cardiology, had A-fib with RVR -managed with amiodarone, subsequently off Cardene drip,Patient has subluxed right knee Ortho was consulted advised knee immobilizer WBAT and eventually needs revision and will follow-up with Dr. Charlann Boxer after discharge Echo showed severely dilated root 52 mm EF 60 to 65% no RWMA indeterminate diastolic function, BP improved in 90s heart rate better. Seen by Dr. Lavinia Sharps stable hemodynamics, advised to hold off anticoagulation. repeat CT dissection study shows worsening of dissection/hematoma reviewed by Dr. Dorris Fetch, and patient is not a candidate for surgical intervention explained the risk of  anticoagulation which may lead to worsening of her dissection/bleeding.  I reviewed by cardiology discussed with the patient, at this point holding off on anticoagulation and patient agreeable. Palliative care has been involved overall symptoms better. She at this time wants to return home with outpatient palliative care. She mentionted about looking for second opinion> Advised to f/u with Dr Laneta Simmers and she is aware that two cardiothoracic surgeons said she is not a candidate for surgery. Heart rate doing well on amiodarone, metoprolol.   Discharge Diagnoses:  Principal Problem:   Aortic dissection (HCC) Active Problems:   Paroxysmal atrial fibrillation (HCC)   Intramural aortic hematoma (HCC)  Aortic hematoma versus type B dissection with thrombosed false lumen Chest pain and back pain Hypertensive emergency: Per cardiothoracic surgery patient is a high risk for surgical intervention advised medical management/BP optimization. Dr. Lavinia Sharps advised against anticoagulation > repeat CT studies 5/17 showed worsening of dissection/hematoma reviewed by Dr. Dorris Fetch, and patient is not a candidate for surgical intervention explained the risk of anticoagulation which may lead to worsening of her dissection/bleeding and a this time holding off on resuming Eliquis patient is agreeable, seen by palliative care plan for outpatient palliative care follow-up physical to evaluate and hemodynamically stable. CT SURGERY number has been provided for follow-up   A-fib with RVR, new onset: Heart rate controlled on, amio and metoprolol.  Anticoagulation discontinued per cardiology, patient agreeable   Acute hypoxic respiratory failure secondary to hypoventilation chest x-ray low volume.  Resolved   Hyponatremia: Monitor sodium level  in 129-130 Hyperlipidemia: Continue statin Hypothyroidism: Continue Synthroid.   Severe arthritis A1c knee  pain, right Previous multiple bilateral knee surgeries: Imaging  suggest new mild anterior subluxation of the tibial component cont pain control w/ oxy IR prn lidocaine patch prn , PCCM Spoke w/ Ortho: advised to cont Knee immobilizer, WBAT. Can have KI off when not ambulating. She needs a revision on that knee once she's more stable but now given her #1- doubt she can get knee surgery. She can see Charlann Boxer once she gets discharged   Morbid obesity Patient's Body mass index is 41.79 kg/m. : Will benefit with PCP follow-up, weight loss  healthy lifestyle and outpatient sleep evaluation.   Goals of care: Palliative care has been involved changed to DNR overall prognosis poor recommended hospice, patient at this time would like to return home with outpatient palliative care, she is provided with PCP CT surgery number for follow-up  Consults: CT surgery ICU cardiology palliative care Subjective: Alert awake oriented resting comfortably somewhat tearful today.  Discharge Exam: Vitals:   11/19/22 0744 11/19/22 1442  BP: (!) 85/57 114/66  Pulse: 90   Resp: 20   Temp: 98 F (36.7 C)   SpO2: 91%    General: Pt is alert, awake, not in acute distress Cardiovascular: RRR, S1/S2 +, no rubs, no gallops Respiratory: CTA bilaterally, no wheezing, no rhonchi Abdominal: Soft, NT, ND, bowel sounds + Extremities: no edema, no cyanosis  Discharge Instructions  Discharge Instructions     Discharge instructions   Complete by: As directed    Please call call MD or return to ER for similar or worsening recurring problem that brought you to hospital or if any fever,nausea/vomiting,abdominal pain, uncontrolled pain, chest pain,  shortness of breath or any other alarming symptoms.  Please follow-up your doctor as instructed in a week time and call the office for appointment.  Please avoid alcohol, smoking, or any other illicit substance and maintain healthy habits including taking your regular medications as prescribed.  You were cared for by a hospitalist during your  hospital stay. If you have any questions about your discharge medications or the care you received while you were in the hospital after you are discharged, you can call the unit and ask to speak with the hospitalist on call if the hospitalist that took care of you is not available.  Once you are discharged, your primary care physician will handle any further medical issues. Please note that NO REFILLS for any discharge medications will be authorized once you are discharged, as it is imperative that you return to your primary care physician (or establish a relationship with a primary care physician if you do not have one) for your aftercare needs so that they can reassess your need for medications and monitor your lab values   Increase activity slowly   Complete by: As directed       Allergies as of 11/19/2022       Reactions   Crestor [rosuvastatin Calcium] Other (See Comments)   Muscle aches   Lipitor [atorvastatin] Other (See Comments)   Muscle aches   Hctz [hydrochlorothiazide] Other (See Comments)   Pt doesn't remember        Medication List     STOP taking these medications    aspirin 325 MG tablet   ferrous sulfate 325 (65 FE) MG tablet Commonly known as: FerrouSul   methocarbamol 500 MG tablet Commonly known as: Robaxin   telmisartan 40 MG tablet Commonly known as: MICARDIS       TAKE these medications    aluminum-magnesium hydroxide  200-200 MG/5ML suspension Take 15 mLs by mouth every 6 (six) hours as needed for up to 14 days for indigestion.   amiodarone 200 MG tablet Commonly known as: Pacerone Take 1 tablet (200 mg total) by mouth daily.   CALCIUM 600 PO Take 600 mg by mouth daily.   FLUoxetine 40 MG capsule Commonly known as: PROZAC Take 40 mg by mouth daily.   HYDROcodone-acetaminophen 5-325 MG tablet Commonly known as: NORCO/VICODIN Take 1 tablet by mouth every 6 (six) hours as needed for up to 15 doses for severe pain.   latanoprost 0.005 %  ophthalmic solution Commonly known as: XALATAN Place 1 drop into the right eye at bedtime.   levothyroxine 88 MCG tablet Commonly known as: SYNTHROID Take 88 mcg by mouth daily before breakfast.   magnesium oxide 400 (240 Mg) MG tablet Commonly known as: MAG-OX Take 400 mg by mouth 2 (two) times daily.   metoprolol tartrate 25 MG tablet Commonly known as: LOPRESSOR Take 0.5 tablets (12.5 mg total) by mouth 2 (two) times daily.   MINERIN CREME EX Apply 1 application  topically daily.   pantoprazole 40 MG tablet Commonly known as: Protonix Take 1 tablet (40 mg total) by mouth daily.   rosuvastatin 10 MG tablet Commonly known as: CRESTOR Take 10 mg by mouth daily.   traZODone 50 MG tablet Commonly known as: DESYREL Take 50 mg by mouth at bedtime.   Victoza 18 MG/3ML Sopn Generic drug: liraglutide Inject 1.2 mg into the skin daily.   Vitamin B-12 2500 MCG Subl Place 2,500 mcg under the tongue daily.        Follow-up Information     Ellyn Hack, MD Follow up in 1 week(s).   Specialty: Family Medicine Contact information: [address] [place] [address] [phone]         Alleen Borne, MD Follow up in 3 day(s).   Specialty: Cardiothoracic Surgery Contact information: [address] [place] [address] [phone]                Allergies  Allergen Reactions   Crestor [Rosuvastatin Calcium] Other (See Comments)    Muscle aches     Lipitor [Atorvastatin] Other (See Comments)    Muscle aches    Hctz [Hydrochlorothiazide] Other (See Comments)    Pt doesn't remember    The results of significant diagnostics from this hospitalization (including imaging, microbiology, ancillary and laboratory) are listed below for reference.    Microbiology: Recent Results (from the past 240 hour(s))  MRSA Next Gen by PCR, Nasal     Status: None   Collection Time: 11/12/22  8:08 PM   Specimen: Nasal Mucosa; Nasal  Swab  Result Value Ref Range Status   MRSA by PCR Next Gen NOT DETECTED NOT DETECTED Final    Comment: (NOTE) The GeneXpert MRSA Assay (FDA approved for NASAL specimens only), is one component of a comprehensive MRSA colonization surveillance program. It is not intended to diagnose MRSA infection nor to guide or monitor treatment for MRSA infections. Test performance is not FDA approved in patients less than 96 years old. Performed at [facility], [address] [place], [place] [address]     Procedures/Studies: CT Angio Chest/Abd/Pel for Dissection W and/or W/WO  Result Date: 11/18/2022 CLINICAL DATA:  Chest pain radiating to back EXAM: CT ANGIOGRAPHY CHEST, ABDOMEN AND PELVIS TECHNIQUE: Non-contrast CT of the chest was initially obtained. Multidetector CT imaging through the  chest, abdomen and pelvis was performed using the standard protocol during bolus administration of intravenous contrast. Multiplanar reconstructed images and MIPs were obtained and reviewed to evaluate the vascular anatomy. RADIATION DOSE REDUCTION: This exam was performed according to the departmental dose-optimization program which includes automated exposure control, adjustment of the mA and/or kV according to patient size and/or use of iterative reconstruction technique. CONTRAST:  75mL OMNIPAQUE IOHEXOL 350 MG/ML SOLN COMPARISON:  11/12/2022 FINDINGS: CTA CHEST FINDINGS Cardiovascular: There is a stable fusiform ascending thoracic aortic aneurysm measuring up to 4.7 cm in diameter, beginning at the aortic root and extending to the aortic arch. The crescentic intramural hematoma seen throughout the thoracic aorta on prior study is again identified, increased since the prior exam. For example, along the right lateral margin of the ascending thoracic aorta of the intramural hematoma measures up to 1.1 cm in thickness, reference image 36/7, previously measuring 0.8 cm. Also, along the anterior margin of the descending  thoracic aorta, the intramural hematoma now measures up to 0.9 cm, previously measuring 0.6 cm. I do not see any frank dissection or focal ulcerative plaque at this time. The heart is unremarkable without pericardial effusion. No evidence of pulmonary emboli. Mediastinum/Nodes: No enlarged mediastinal, hilar, or axillary lymph nodes. Thyroid gland, trachea, and esophagus demonstrate no significant findings. Lungs/Pleura: Interval development of a small left pleural effusion, volume estimated less than 500 cc. Mild compressive atelectasis within the left lower lobe. No acute airspace disease or pneumothorax. Minimal scarring or subsegmental atelectasis is again seen within the bilateral upper lobes. Central airways are patent. Musculoskeletal: Subacute to chronic bilateral rib fractures are unchanged. No acute or destructive bony abnormalities. Reconstructed images demonstrate no additional findings. Review of the MIP images confirms the above findings. CTA ABDOMEN AND PELVIS FINDINGS VASCULAR Aorta: Intramural hematoma is seen along the left lateral aspect of the aorta at the level of the diaphragmatic hiatus, measuring 0.8 cm in thickness. The remainder of the abdominal aorta is unremarkable, without evidence of aneurysm, dissection, or critical stenosis. Diffuse atherosclerosis. Celiac: Patent without evidence of aneurysm, dissection, vasculitis or significant stenosis. Calcified atheromatous plaque at the origin of the celiac axis. SMA: Patent without evidence of aneurysm, dissection, vasculitis or significant stenosis. 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 There is atherosclerosis at the origin of the bilateral renal arteries, without evidence of critical stenosis. No aneurysm, dissection, or vasculitis. IMA: Patent without evidence of aneurysm, dissection, vasculitis or significant stenosis. Inflow: Patent without evidence of aneurysm, dissection, vasculitis or  significant stenosis. Veins: No obvious venous abnormality within the limitations of this arterial phase study. Review of the MIP images confirms the above findings. NON-VASCULAR Hepatobiliary: Small calcified gallstones without cholecystitis. The liver is unremarkable. No biliary duct dilation. Pancreas: Unremarkable. No pancreatic ductal dilatation or surrounding inflammatory changes. Spleen: Normal in size without focal abnormality. Adrenals/Urinary Tract: There is bilateral renal cortical scarring and atrophy, right greater than left. Nonobstructing 1 cm calculus lower pole left kidney. No obstructive uropathy within either kidney. The adrenals are stable. Bladder is grossly unremarkable. Stomach/Bowel: No bowel obstruction or ileus. Normal appendix right lower quadrant. No bowel wall thickening or inflammatory change. Postsurgical changes from prior bariatric surgery. Lymphatic: No pathologic adenopathy within the abdomen or pelvis. Reproductive: Status post hysterectomy. No adnexal masses. Other: No free fluid or free intraperitoneal gas. No abdominal wall hernia. Musculoskeletal: Right hip arthroplasty. No acute or destructive bony lesions. Reconstructed images demonstrate no additional findings. Review of the  MIP images confirms the above findings. IMPRESSION: Vascular: 1. Progression of the intramural hematoma seen throughout the thoracic aorta on prior study, now measuring up to 11 mm within the ascending thoracic aorta and 9 mm within the descending thoracic aorta. There is no evidence of dissection flap or ulcerative plaque. Vascular surgery consultation again recommended. 2. Stable 4.7 cm ascending thoracic aortic aneurysm, beginning at the aortic root. 3. No evidence of pulmonary embolus. 4.  Aortic Atherosclerosis (ICD10-I70.0). Nonvascular: 1. Interval development of small left pleural effusion, volume estimated 500 cc. Minimal left lower lobe compressive atelectasis. 2. Bilateral renal cortical  scarring and atrophy, right greater than left, unchanged. Critical Value/emergent results were called by telephone at the time of interpretation on 11/18/2022 at 3:51 pm to provider [facility] , who verbally acknowledged these results. Electronically Signed   By: Sharlet Salina M.D.   On: 11/18/2022 15:57   DG Chest Port 1 View  Result Date: 11/15/2022 CLINICAL DATA:  Hypoxia EXAM: PORTABLE CHEST 1 VIEW COMPARISON:  11/12/2022 FINDINGS: Shallow inspiration. Mild cardiac enlargement. Lungs are clear. No pleural effusions. No pneumothorax. Mediastinal contours appear intact. Degenerative changes in the spine and shoulders. IMPRESSION: Shallow inspiration.  No evidence of active pulmonary disease. Electronically Signed   By: Burman Nieves M.D.   On: 11/15/2022 15:28   DG Knee Right Port  Result Date: 11/15/2022 CLINICAL DATA:  Right knee pain. EXAM: PORTABLE RIGHT KNEE - 1-2 VIEW COMPARISON:  Right knee radiographs 06/06/2022 and 05/19/2014 FINDINGS: There is diffuse decreased bone mineralization. Postsurgical changes are again seen of total right knee arthroplasty. The knee is more extended on the current lateral view compared to the prior lateral view, and the mid AP dimension of the distal femoral prosthesis appears to be approximately 11 mm posterior to the mid AP dimension of the tibial prosthesis on this view. This appears changed compared to the immediate postoperative 05/19/2014 prior right knee lateral view. Otherwise, no perihardware lucency is seen to indicate hardware failure or loosening. No significant joint effusion. No acute fracture or dislocation. IMPRESSION: Postsurgical changes of total right knee arthroplasty. The mid AP dimension of the distal femoral prosthesis appears to be approximately 11 mm posterior to the mid AP dimension of the tibial prosthesis, suggesting new mild anterior subluxation of the tibial component with respect to the distal femoral component. Note is made this is seen  on the lateral view with the patient in more extension positioning on the current radiographs compared to the most recent 06/06/2022 radiographs. Electronically Signed   By: Neita Garnet M.D.   On: 11/15/2022 13:27   ECHOCARDIOGRAM COMPLETE  Result Date: 11/14/2022    ECHOCARDIOGRAM REPORT   Patient Name:   ARLENY RAMP Date of Exam: 11/14/2022 Medical Rec #:  [id number]    Height:       60.0 in Accession #:    [id number]   Weight:       212.3 lb Date of Birth:  January 14, 1956    BSA:          1.915 m Patient Age:    66 years     BP:           133/79 mmHg Patient Gender: F            HR:           92 bpm. Exam Location:  Inpatient Procedure: 2D Echo, Color Doppler and Cardiac Doppler Indications:    I48.91* Unspecified atrial fibrillation  History:  Patient has prior history of Echocardiogram examinations, most                 recent 02/26/2021. Risk Factors:Hypertension and Dyslipidemia.  Sonographer:    Darlys Gales Referring Phys: [id number] ADITYA PALIWAL  Sonographer Comments: Possible Type A Dissection by CT IMPRESSIONS  1. Severely dilated aortic root; suggest CTA or MRA to further assess.  2. Left ventricular ejection fraction, by estimation, is 60 to 65%. The left ventricle has normal function. The left ventricle has no regional wall motion abnormalities. There is mild left ventricular hypertrophy. Left ventricular diastolic function could not be evaluated.  3. Right ventricular systolic function is normal. The right ventricular size is normal.  4. The mitral valve is normal in structure. No evidence of mitral valve regurgitation. No evidence of mitral stenosis.  5. The aortic valve was not well visualized. Aortic valve regurgitation is trivial. No aortic stenosis is present.  6. Aortic dilatation noted. There is severe dilatation of the aortic root, measuring 52 mm.  7. The inferior vena cava is normal in size with greater than 50% respiratory variability, suggesting right atrial pressure of 3 mmHg.  FINDINGS  Left Ventricle: Left ventricular ejection fraction, by estimation, is 60 to 65%. The left ventricle has normal function. The left ventricle has no regional wall motion abnormalities. The left ventricular internal cavity size was normal in size. There is  mild left ventricular hypertrophy. Left ventricular diastolic function could not be evaluated due to atrial fibrillation. Left ventricular diastolic function could not be evaluated. Right Ventricle: The right ventricular size is normal. Right ventricular systolic function is normal. Left Atrium: Left atrial size was normal in size. Right Atrium: Right atrial size was normal in size. Pericardium: Trivial pericardial effusion is present. Mitral Valve: The mitral valve is normal in structure. No evidence of mitral valve regurgitation. No evidence of mitral valve stenosis. Tricuspid Valve: The tricuspid valve is normal in structure. Tricuspid valve regurgitation is trivial. No evidence of tricuspid stenosis. Aortic Valve: The aortic valve was not well visualized. Aortic valve regurgitation is trivial. No aortic stenosis is present. Pulmonic Valve: The pulmonic valve was not well visualized. Pulmonic valve regurgitation is not visualized. Aorta: Aortic dilatation noted. There is severe dilatation of the aortic root, measuring 52 mm. Venous: The inferior vena cava is normal in size with greater than 50% respiratory variability, suggesting right atrial pressure of 3 mmHg. IAS/Shunts: The interatrial septum was not well visualized. Additional Comments: Severely dilated aortic root; suggest CTA or MRA to further assess.  LEFT VENTRICLE PLAX 2D LVIDd:         3.70 cm Diastology LVIDs:         2.50 cm LV e' medial:    9.01 cm/s LV PW:         1.30 cm LV E/e' medial:  12.3 LV IVS:        1.30 cm LV e' lateral:   10.60 cm/s                        LV E/e' lateral: 10.5  RIGHT VENTRICLE TAPSE (M-mode): 1.7 cm LEFT ATRIUM           Index        RIGHT ATRIUM            Index LA Vol (A4C): 21.0 ml 10.97 ml/m  RA Area:     11.60 cm  RA Volume:   26.10 ml  13.63 ml/m  AORTIC VALVE LVOT Vmax:   127.00 cm/s LVOT Vmean:  92.067 cm/s LVOT VTI:    0.195 m MITRAL VALVE MV Area (PHT): 5.06 cm     SHUNTS MV Decel Time: 150 msec     Systemic VTI: 0.20 m MV E velocity: 111.00 cm/s Olga Millers MD Electronically signed by Olga Millers MD Signature Date/Time: 11/14/2022/2:10:02 PM    Final    CT Angio Chest PE W/Cm &/Or Wo Cm  Result Date: 11/12/2022 CLINICAL DATA:  Concern for pulmonary embolism. EXAM: CT ANGIOGRAPHY CHEST WITH CONTRAST TECHNIQUE: Multidetector CT imaging of the chest was performed using the standard protocol during bolus administration of intravenous contrast. Multiplanar CT image reconstructions and MIPs were obtained to evaluate the vascular anatomy. RADIATION DOSE REDUCTION: This exam was performed according to the departmental dose-optimization program which includes automated exposure control, adjustment of the mA and/or kV according to patient size and/or use of iterative reconstruction technique. CONTRAST:  75mL OMNIPAQUE IOHEXOL 350 MG/ML SOLN COMPARISON:  CT 06/06/2022 FINDINGS: Cardiovascular: No filling defects within the pulmonary arteries to suggest acute pulmonary embolism. There is a crescent shaped rim of intermediate density fluid along the ascending thoracic aorta from 6:00 to 12:00 (image 47/5.) this rim of high-density fluid begins at the sinus of Valsalva extends through the aortic arch into the descending thoracic aorta (image 51/5). Findings concerning for intramural hematoma. No pericardial fluid. No pericardial hematoma or fluid. No mediastinal hematoma or fluid. Mediastinum No axillary or supraclavicular adenopathy. No mediastinal or hilar adenopathy. No pericardial fluid. Mediastinum: No mediastinal hematoma. Lungs/Pleura: No pulmonary edema. No pleural fluid. No pneumothorax. Upper Abdomen: Limited view  of the liver, kidneys, pancreas are unremarkable. Normal adrenal glands. Musculoskeletal: No aggressive osseous lesion. Review of the MIP images confirms the above findings. IMPRESSION: 1. Crescent shaped high-density fluid collection along the ascending thoracic aorta is concerning for intramural hematoma versus acute type A aortic dissection. Recommend emergent cardiothoracic surgical consultation. 2. No pericardial fluid or mediastinal mediastinal hematoma. 3. No acute pulmonary embolism. Critical Value/emergent results were called by telephone at the time of interpretation on 11/12/2022 at 5:28 pm to provider Riki Sheer , who verbally acknowledged these results. Electronically Signed   By: Genevive Bi M.D.   On: 11/12/2022 17:28   DG Chest 2 View  Result Date: 11/12/2022 CLINICAL DATA:  Chest pain EXAM: CHEST - 2 VIEW COMPARISON:  Chest x-ray May 19, 2016 FINDINGS: Mild subsegmental atelectasis in the bases. Mild cardiomegaly. The hila and mediastinum are unremarkable. No pneumothorax. No other acute abnormalities. IMPRESSION: 1. Mild cardiomegaly. 2. Mild subsegmental atelectasis in the bases. Electronically Signed   By: Gerome Sam III M.D.   On: 11/12/2022 14:49    Labs: BNP (last 3 results) No results for input(s): "BNP" in the last 8760 hours. Basic Metabolic Panel: Recent Labs  Lab 11/13/22 0153 11/14/22 0318 11/14/22 0927 11/15/22 1049 11/16/22 0238 11/17/22 0235 11/18/22 0357 11/19/22 0232  NA 135   < > 131* 130* 130* 131* 129* 129*  K 4.5   < >  --  4.5 4.4 4.6 4.5 4.6  CL 102   < >  --  98 96* 96* 96* 95*  CO2 23   < >  --  19* 22 26 25 23   GLUCOSE 131*   < >  --  124* 163* 108* 130* 114*  BUN 15   < >  --  26* 27* 28* 25* 21  CREATININE 0.97   < >  --  1.08* 1.09* 0.98 1.10* 1.06*  CALCIUM 8.5*   < >  --  8.7* 8.7* 8.7* 8.6* 8.6*  MG 1.8  --  1.9 2.1 1.9  --   --   --   PHOS 3.5  --   --   --   --   --   --   --    < > = values in this interval not  displayed.   Liver Function Tests: Recent Labs  Lab 11/14/22 0318 11/15/22 1049  AST 17 20  ALT 28 22  ALKPHOS 93 86  BILITOT 1.5* 1.0  PROT 6.4* 6.2*  ALBUMIN 3.0* 2.9*   No results for input(s): "LIPASE", "AMYLASE" in the last 168 hours. No results for input(s): "AMMONIA" in the last 168 hours. CBC: Recent Labs  Lab 11/13/22 0153 11/15/22 1049 11/18/22 0357 11/19/22 0232  WBC 9.6 12.5* 11.0* 11.0*  HGB 13.5 14.2 12.9 12.6  HCT 41.8 42.0 38.9 37.4  MCV 96.8 93.8 94.6 94.4  PLT 164 212 216 221  Cardiac Enzymes: No results for input(s): "CKTOTAL", "CKMB", "CKMBINDEX", "TROPONINI" in the last 168 hours. BNP: Invalid input(s): "POCBNP" CBG: Recent Labs  Lab 11/16/22 1902 11/16/22 2139  GLUCAP 143* 97   Recent Labs  Lab 11/13/22 0153 11/15/22 1049 11/18/22 0357 11/19/22 0232  WBC 9.6 12.5* 11.0* 11.0*   Microbiology Recent Results (from the past 240 hour(s))  MRSA Next Gen by PCR, Nasal     Status: None   Collection Time: 11/12/22  8:08 PM   Specimen: Nasal Mucosa; Nasal Swab  Result Value Ref Range Status   MRSA by PCR Next Gen NOT DETECTED NOT DETECTED Final    Comment: (NOTE) The GeneXpert MRSA Assay (FDA approved for NASAL specimens only), is one component of a comprehensive MRSA colonization surveillance program. It is not intended to diagnose MRSA infection nor to guide or monitor treatment for MRSA infections. Test performance is not FDA approved in patients less than 16 years old. Performed at [facility], [address], [place], [place] [address]    Time coordinating discharge: 25 minutes  SIGNED: Lanae Boast, MD  [facility] 11/19/2022, 2:49 PM  If 7PM-7AM, please contact night-coverage [email or web address]

## 2022-11-19 NOTE — TOC Transition Note (Signed)
Transition of Care [facility]) - CM/SW Discharge Note   Patient Details  Name: Lauren Clark MRN: [id number] Date of Birth: [birth year]  Transition of Care [facility]) CM/SW Contact:  Verna Czech [place], [place] Phone Number: 11/19/2022, 4:51 PM   Clinical Narrative:    Patient discharging home today, family friend will be providing transport home. MD recommending outpatient palliative care. Patient contacted for choice. Patient had no preference, agreeable to [facility].  [facility] liaison will send chart for review to ensure patient meets criteria and will then contact patient.  Patrese Neal, LCSW Transition of Care      Barriers to Discharge: Continued Medical Work up   Patient Goals and CMS Choice      Discharge Placement                         Discharge Plan and Services Additional resources added to the After Visit Summary for                                       Social Determinants of Health (SDOH) Interventions SDOH Screenings   Food Insecurity: No Food Insecurity (11/12/2022)  Housing: Low Risk  (11/12/2022)  Transportation Needs: No Transportation Needs (11/12/2022)  Utilities: Not At Risk (11/12/2022)  Depression (PHQ2-9): Low Risk  (09/02/2019)  Tobacco Use: Low Risk  (11/12/2022)     Readmission Risk Interventions     No data to display

## 2022-11-19 NOTE — Progress Notes (Addendum)
Progress Note  Patient Name: Lauren Clark Date of Encounter: 11/19/2022  Primary Cardiologist: None   Subjective   "I still have pain when I eat." Denies sob or chest pain.  Inpatient Medications    Scheduled Meds:  amiodarone  400 mg Oral BID   FLUoxetine  40 mg Oral Daily   heparin  5,000 Units Subcutaneous Q8H   latanoprost  1 drop Right Eye QHS   levothyroxine  88 mcg Oral QAC breakfast   lidocaine  1 patch Transdermal Q24H   metoprolol tartrate  12.5 mg Oral BID   rosuvastatin  10 mg Oral QPM   senna-docusate  1 tablet Oral BID   traZODone  50 mg Oral QHS   Continuous Infusions:  lactated ringers 125 mL/hr at 11/19/22 0829   PRN Meds: alum & mag hydroxide-simeth, docusate sodium, HYDROmorphone (DILAUDID) injection, oxyCODONE, polyethylene glycol   Vital Signs    Vitals:   11/18/22 2238 11/18/22 2310 11/19/22 0435 11/19/22 0744  BP: (!) 82/61 105/68 91/65 (!) 85/57  Pulse:    90  Resp:    20  Temp:   98.7 F (37.1 C) 98 F (36.7 C)  TempSrc:   Oral Oral  SpO2:  92% 90% 91%  Weight:   98.2 kg   Height:        Intake/Output Summary (Last 24 hours) at 11/19/2022 1009 Last data filed at 11/18/2022 1700 Gross per 24 hour  Intake 892.83 ml  Output --  Net 892.83 ml   Filed Weights   11/17/22 0500 11/18/22 0452 11/19/22 0435  Weight: 96.3 kg 97.1 kg 98.2 kg    Telemetry    Atrial fib with a CVR - Personally Reviewed  ECG    none - Personally Reviewed  Physical Exam   GEN: No acute distress.   Neck: No JVD Cardiac: IRIRR, no murmurs, rubs, or gallops.  Respiratory: Clear to auscultation bilaterally. GI: Soft, nontender, non-distended  MS: No edema; No deformity. Neuro:  Nonfocal  Psych: Normal affect   Labs    Chemistry Recent Labs  Lab 11/14/22 0318 11/14/22 1610 11/15/22 1049 11/16/22 0238 11/17/22 0235 11/18/22 0357 11/19/22 0232  NA 128*   < > 130*   < > 131* 129* 129*  K 4.2  --  4.5   < > 4.6 4.5 4.6  CL 100  --  98   < >  96* 96* 95*  CO2 22  --  19*   < > 26 25 23   GLUCOSE 134*  --  124*   < > 108* 130* 114*  BUN 16  --  26*   < > 28* 25* 21  CREATININE 0.92  --  1.08*   < > 0.98 1.10* 1.06*  CALCIUM 8.3*  --  8.7*   < > 8.7* 8.6* 8.6*  PROT 6.4*  --  6.2*  --   --   --   --   ALBUMIN 3.0*  --  2.9*  --   --   --   --   AST 17  --  20  --   --   --   --   ALT 28  --  22  --   --   --   --   ALKPHOS 93  --  86  --   --   --   --   BILITOT 1.5*  --  1.0  --   --   --   --  GFRNONAA >60  --  56*   < > >60 55* 58*  ANIONGAP 6  --  13   < > 9 8 11    < > = values in this interval not displayed.     Hematology Recent Labs  Lab 11/15/22 1049 11/18/22 0357 11/19/22 0232  WBC 12.5* 11.0* 11.0*  RBC 4.48 4.11 3.96  HGB 14.2 12.9 12.6  HCT 42.0 38.9 37.4  MCV 93.8 94.6 94.4  MCH 31.7 31.4 31.8  MCHC 33.8 33.2 33.7  RDW 12.6 12.7 12.6  PLT 212 216 221    Cardiac EnzymesNo results for input(s): "TROPONINI" in the last 168 hours. No results for input(s): "TROPIPOC" in the last 168 hours.   BNPNo results for input(s): "BNP", "PROBNP" in the last 168 hours.   DDimer  Recent Labs  Lab 11/12/22 1504  DDIMER 1.67*     Radiology    CT Angio Chest/Abd/Pel for Dissection W and/or W/WO  Result Date: 11/18/2022 CLINICAL DATA:  Chest pain radiating to back EXAM: CT ANGIOGRAPHY CHEST, ABDOMEN AND PELVIS TECHNIQUE: Non-contrast CT of the chest was initially obtained. Multidetector CT imaging through the chest, abdomen and pelvis was performed using the standard protocol during bolus administration of intravenous contrast. Multiplanar reconstructed images and MIPs were obtained and reviewed to evaluate the vascular anatomy. RADIATION DOSE REDUCTION: This exam was performed according to the departmental dose-optimization program which includes automated exposure control, adjustment of the mA and/or kV according to patient size and/or use of iterative reconstruction technique. CONTRAST:  75mL OMNIPAQUE IOHEXOL 350  MG/ML SOLN COMPARISON:  11/12/2022 FINDINGS: CTA CHEST FINDINGS Cardiovascular: There is a stable fusiform ascending thoracic aortic aneurysm measuring up to 4.7 cm in diameter, beginning at the aortic root and extending to the aortic arch. The crescentic intramural hematoma seen throughout the thoracic aorta on prior study is again identified, increased since the prior exam. For example, along the right lateral margin of the ascending thoracic aorta of the intramural hematoma measures up to 1.1 cm in thickness, reference image 36/7, previously measuring 0.8 cm. Also, along the anterior margin of the descending thoracic aorta, the intramural hematoma now measures up to 0.9 cm, previously measuring 0.6 cm. I do not see any frank dissection or focal ulcerative plaque at this time. The heart is unremarkable without pericardial effusion. No evidence of pulmonary emboli. Mediastinum/Nodes: No enlarged mediastinal, hilar, or axillary lymph nodes. Thyroid gland, trachea, and esophagus demonstrate no significant findings. Lungs/Pleura: Interval development of a small left pleural effusion, volume estimated less than 500 cc. Mild compressive atelectasis within the left lower lobe. No acute airspace disease or pneumothorax. Minimal scarring or subsegmental atelectasis is again seen within the bilateral upper lobes. Central airways are patent. Musculoskeletal: Subacute to chronic bilateral rib fractures are unchanged. No acute or destructive bony abnormalities. Reconstructed images demonstrate no additional findings. Review of the MIP images confirms the above findings. CTA ABDOMEN AND PELVIS FINDINGS VASCULAR Aorta: Intramural hematoma is seen along the left lateral aspect of the aorta at the level of the diaphragmatic hiatus, measuring 0.8 cm in thickness. The remainder of the abdominal aorta is unremarkable, without evidence of aneurysm, dissection, or critical stenosis. Diffuse atherosclerosis. Celiac: Patent without  evidence of aneurysm, dissection, vasculitis or significant stenosis. Calcified atheromatous plaque at the origin of the celiac axis. SMA: Patent without evidence of aneurysm, dissection, vasculitis or significant stenosis. 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 There is atherosclerosis at the origin of  the bilateral renal arteries, without evidence of critical stenosis. No aneurysm, dissection, or vasculitis. IMA: Patent without evidence of aneurysm, dissection, vasculitis or significant stenosis. Inflow: Patent without evidence of aneurysm, dissection, vasculitis or significant stenosis. Veins: No obvious venous abnormality within the limitations of this arterial phase study. Review of the MIP images confirms the above findings. NON-VASCULAR Hepatobiliary: Small calcified gallstones without cholecystitis. The liver is unremarkable. No biliary duct dilation. Pancreas: Unremarkable. No pancreatic ductal dilatation or surrounding inflammatory changes. Spleen: Normal in size without focal abnormality. Adrenals/Urinary Tract: There is bilateral renal cortical scarring and atrophy, right greater than left. Nonobstructing 1 cm calculus lower pole left kidney. No obstructive uropathy within either kidney. The adrenals are stable. Bladder is grossly unremarkable. Stomach/Bowel: No bowel obstruction or ileus. Normal appendix right lower quadrant. No bowel wall thickening or inflammatory change. Postsurgical changes from prior bariatric surgery. Lymphatic: No pathologic adenopathy within the abdomen or pelvis. Reproductive: Status post hysterectomy. No adnexal masses. Other: No free fluid or free intraperitoneal gas. No abdominal wall hernia. Musculoskeletal: Right hip arthroplasty. No acute or destructive bony lesions. Reconstructed images demonstrate no additional findings. Review of the MIP images confirms the above findings. IMPRESSION: Vascular: 1. Progression of the  intramural hematoma seen throughout the thoracic aorta on prior study, now measuring up to 11 mm within the ascending thoracic aorta and 9 mm within the descending thoracic aorta. There is no evidence of dissection flap or ulcerative plaque. Vascular surgery consultation again recommended. 2. Stable 4.7 cm ascending thoracic aortic aneurysm, beginning at the aortic root. 3. No evidence of pulmonary embolus. 4.  Aortic Atherosclerosis (ICD10-I70.0). Nonvascular: 1. Interval development of small left pleural effusion, volume estimated 500 cc. Minimal left lower lobe compressive atelectasis. 2. Bilateral renal cortical scarring and atrophy, right greater than left, unchanged. Critical Value/emergent results were called by telephone at the time of interpretation on 11/18/2022 at 3:51 pm to provider [facility] , who verbally acknowledged these results. Electronically Signed   By: Sharlet Salina M.D.   On: 11/18/2022 15:57    Cardiac Studies   See above  Patient Profile     67 y.o. female admitted with chest pain due to aortic intramural hematoma.   Assessment & Plan    Intramural hematoma - she is felt not to be a candidate for surgical repair.  Atrial fib - she was on an OAC. At this point it has been stopped due to the increased risk of bleeding into the hematoma. Continue oral amio for rate control. 200 mg daily as an outpatient. Coags - I will stop the heparin.  Disp - ok for referral to hospice. I think her prognosis is very poor. [facility] will sign off.   Medication Recommendations:  see above Other recommendations (labs, testing, etc):  none Follow up as an outpatient:  none  For questions or updates, please contact [facility] Please consult [email or web address] for contact info under Cardiology/STEMI.      Signed, Lewayne Bunting, MD  11/19/2022, 10:09 AM

## 2022-11-21 ENCOUNTER — Other Ambulatory Visit: Payer: Self-pay | Admitting: Surgery

## 2022-11-21 DIAGNOSIS — I712 Thoracic aortic aneurysm, without rupture, unspecified: Secondary | ICD-10-CM

## 2023-01-04 ENCOUNTER — Ambulatory Visit
Admission: RE | Admit: 2023-01-04 | Discharge: 2023-01-04 | Disposition: A | Discharge status: Home / Self Care | Payer: 59 | Source: Ambulatory Visit | Attending: Surgery | Admitting: Surgery

## 2023-01-04 ENCOUNTER — Ambulatory Visit (INDEPENDENT_AMBULATORY_CARE_PROVIDER_SITE_OTHER): Payer: 59 | Admitting: Surgery

## 2023-01-04 ENCOUNTER — Other Ambulatory Visit: Payer: Self-pay | Admitting: Surgery

## 2023-01-04 VITALS — BP 163/73 | HR 77 | Resp 20 | Ht 60.0 in | Wt 215.0 lb

## 2023-01-04 DIAGNOSIS — I712 Thoracic aortic aneurysm, without rupture, unspecified: Secondary | ICD-10-CM

## 2023-01-04 DIAGNOSIS — I7101 Dissection of ascending aorta: Secondary | ICD-10-CM | POA: Diagnosis not present

## 2023-01-04 MED ORDER — IOPAMIDOL (ISOVUE-370) INJECTION 76%
75.0000 mL | Freq: Once | INTRAVENOUS | Status: AC | PRN
  Administered 2023-01-04: 75 mL via INTRAVENOUS

## 2023-01-04 MED ORDER — METOPROLOL TARTRATE 25 MG PO TABS
25.0000 mg | ORAL_TABLET | Freq: Two times a day (BID) | ORAL | 1 refills | Status: DC
Start: 2023-01-04 — End: 2023-02-03

## 2023-01-04 MED ORDER — METOPROLOL TARTRATE 25 MG PO TABS: 25.0000 mg | ORAL_TABLET | Freq: Two times a day (BID) | ORAL | 6 refills | Status: DC

## 2023-01-04 NOTE — Progress Notes (Signed)
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  CTA of the chest showed either an acute type A aortic dissection with thrombosis of the false lumen versus an intramural hematoma extending from the aortic root through the aortic arch and into the descending thoracic aorta.  The aortic root was measured at about 5 cm.  I did not feel that she was an operative candidate at that time due to her comorbid risk factors and she was admitted to the intensive care unit by CCM for blood pressure control.  She continued to have chest discomfort despite good blood pressure control.  She was transition to oral blood pressure medicine and eventually discharged home.  She said that since going home she has continued to have occasional episodes of chest discomfort which she says is substernal.  She denies any shortness of breath.  She has been living independently.  Current Outpatient Medications  Medication Sig Dispense Refill   Calcium Carbonate (CALCIUM 600 PO) Take 600 mg by mouth daily.     Cyanocobalamin (VITAMIN B-12) 2500 MCG SUBL Place 2,500 mcg under the tongue daily.     FLUoxetine (PROZAC) 40 MG capsule Take 40 mg by mouth daily.     latanoprost (XALATAN) 0.005 % ophthalmic solution Place 1 drop into the right eye at bedtime.      levothyroxine (SYNTHROID, LEVOTHROID) 88 MCG tablet Take 88 mcg by mouth daily before breakfast.      magnesium oxide (MAG-OX) 400 (240 Mg) MG tablet Take 400 mg by mouth 2 (two) times daily.     metoprolol tartrate (LOPRESSOR) 25 MG tablet  Take 1 tablet (25 mg total) by mouth 2 (two) times daily. 60 tablet 1   metoprolol tartrate (LOPRESSOR) 25 MG tablet Take 1 tablet (25 mg total) by mouth 2 (two) times daily. 60 tablet 6   pantoprazole (PROTONIX) 40 MG tablet Take 1 tablet (40 mg total) by mouth daily. 30 tablet 1   rosuvastatin (CRESTOR) 10 MG tablet Take 10 mg by mouth daily.     Skin Protectants, Misc. (MINERIN CREME EX) Apply 1 application  topically daily.     traZODone (DESYREL) 50 MG tablet Take 50 mg by mouth at bedtime.     amiodarone (PACERONE) 200 MG tablet Take 1 tablet (200 mg total) by mouth daily. 30 tablet 0   HYDROcodone-acetaminophen (NORCO/VICODIN) 5-325 MG tablet Take 1 tablet by mouth every 6 (six) hours as needed for up to 15 doses for severe pain. (Patient not taking: Reported on 01/04/2023) 15 tablet 0   VICTOZA 18 MG/3ML SOPN Inject 1.2 mg into the skin daily. (Patient not taking: Reported on 01/04/2023)     No current facility-administered medications for this visit.     Physical Exam: BP (!) 163/73   Pulse 77   Resp 20   Ht 5' (1.524 m)   Wt 215 lb (97.5 kg)   SpO2 92% Comment: RA  BMI 41.99 kg/m  She looks comfortable. Cardiac exam shows a regular rate and rhythm with normal heart sounds.  There is no murmur. Lungs are clear.  Diagnostic Tests:  Narrative & Impression  CLINICAL DATA:  Aortic aneurysm suspected   EXAM: CT ANGIOGRAPHY CHEST WITH CONTRAST   TECHNIQUE: Multidetector CT imaging of the chest was performed using the standard protocol during bolus administration of intravenous contrast. Multiplanar CT image reconstructions and MIPs were obtained to evaluate the vascular anatomy.   RADIATION DOSE REDUCTION: This exam was performed according to the departmental dose-optimization program which includes automated exposure control, adjustment of the mA and/or kV according to patient size and/or use of iterative reconstruction technique.   CONTRAST:  75mL ISOVUE-370 IOPAMIDOL  (ISOVUE-370) INJECTION 76%   COMPARISON:  11/18/2022   FINDINGS: Cardiovascular: Preferential opacification of the thoracic aorta. Unchanged aneurysmal dilatation of the tubular ascending thoracic aorta, measuring 5.0 x 4.8 cm in maximum dimension, previously 4.9 x 4.8 cm when measured similarly. Right eccentric, intermediate attenuation chronic intramural hematoma is similar, measuring up to 1.0 cm in thickness. Distal arch and descending thoracic aorta are normal in caliber measuring up to 2.7 x 2.6 cm. Previously seen intramural hematoma component extending along the descending thoracic aorta has resolved. Mild mixed aortic atherosclerosis. Cardiomegaly. Three-vessel coronary artery calcifications. No pericardial effusion.   Mediastinum/Nodes: No enlarged mediastinal, hilar, or axillary lymph nodes. Thyroid gland, trachea, and esophagus demonstrate no significant findings.   Lungs/Pleura: Unchanged small left pleural effusion and associated atelectasis or consolidation. Bland appearing, bandlike scarring of the lung bases.   Upper Abdomen: No acute abnormality. Status post sleeve partial gastrectomy.   Musculoskeletal: No chest wall abnormality. No acute osseous findings.   Review of the MIP images confirms the above findings.   IMPRESSION: 1. Unchanged aneurysmal dilatation of the tubular ascending thoracic aorta, measuring 5.0 x 4.8 cm in maximum dimension, previously 4.9 x 4.8 cm when measured similarly. 2. Right eccentric, intermediate attenuation chronic intramural hematoma is similar, measuring up to 1.0 cm in thickness. 3. Distal arch and descending thoracic aorta are normal in caliber measuring up to 2.7 x 2.6 cm. Previously seen intramural hematoma component extending along the descending thoracic aorta has resolved. 4. Unchanged small left pleural effusion and associated atelectasis or consolidation. 5. Cardiomegaly and coronary artery disease.   Aortic  Atherosclerosis (ICD10-I70.0).     Electronically Signed   By: Jearld Lesch M.D.   On: 01/04/2023 14:44      Impression:  This 67 year old woman with multiple comorbidities has a 5.0 cm ascending aortic aneurysm with evidence of a chronic intramural hematoma along the right lateral ascending aortic wall.  The previously seen intramural hematoma extending along the descending thoracic aorta has resolved.  I reviewed the CT images with her and answered her questions.  I have recommended that we continue following this given the high risk of surgical replacement of her aortic root and ascending aorta.  I stressed the importance of continued good blood pressure control in preventing further enlargement and acute aortic dissection.  Plan:  I will see her back in 6 months with a CTA of the chest/aorta with gating for aortic surveillance.   Alleen Borne, MD [facility] [phone]

## 2023-01-09 ENCOUNTER — Ambulatory Visit
Admission: RE | Admit: 2023-01-09 | Discharge: 2023-01-09 | Disposition: A | Discharge status: Home / Self Care | Payer: 59 | Source: Ambulatory Visit | Attending: Family Medicine | Admitting: Family Medicine

## 2023-01-09 DIAGNOSIS — E2839 Other primary ovarian failure: Secondary | ICD-10-CM

## 2023-01-11 ENCOUNTER — Emergency Department (HOSPITAL_COMMUNITY): Payer: 59

## 2023-01-11 ENCOUNTER — Inpatient Hospital Stay (HOSPITAL_COMMUNITY)
Admission: EM | Admit: 2023-01-11 | Discharge: 2023-01-15 | DRG: 291 | Disposition: A | Discharge status: Home / Self Care | Payer: 59 | Attending: Internal Medicine | Admitting: Internal Medicine

## 2023-01-11 ENCOUNTER — Encounter (HOSPITAL_COMMUNITY): Payer: Self-pay

## 2023-01-11 DIAGNOSIS — Z96652 Presence of left artificial knee joint: Secondary | ICD-10-CM | POA: Diagnosis present

## 2023-01-11 DIAGNOSIS — Z79890 Hormone replacement therapy (postmenopausal): Secondary | ICD-10-CM

## 2023-01-11 DIAGNOSIS — Z888 Allergy status to other drugs, medicaments and biological substances status: Secondary | ICD-10-CM

## 2023-01-11 DIAGNOSIS — I5033 Acute on chronic diastolic (congestive) heart failure: Secondary | ICD-10-CM | POA: Diagnosis present

## 2023-01-11 DIAGNOSIS — R0609 Other forms of dyspnea: Secondary | ICD-10-CM | POA: Diagnosis not present

## 2023-01-11 DIAGNOSIS — E876 Hypokalemia: Secondary | ICD-10-CM | POA: Diagnosis present

## 2023-01-11 DIAGNOSIS — E1151 Type 2 diabetes mellitus with diabetic peripheral angiopathy without gangrene: Secondary | ICD-10-CM | POA: Diagnosis present

## 2023-01-11 DIAGNOSIS — I10 Essential (primary) hypertension: Secondary | ICD-10-CM | POA: Diagnosis not present

## 2023-01-11 DIAGNOSIS — R000 Tachycardia, unspecified: Secondary | ICD-10-CM | POA: Diagnosis present

## 2023-01-11 DIAGNOSIS — I110 Hypertensive heart disease with heart failure: Principal | ICD-10-CM | POA: Diagnosis present

## 2023-01-11 DIAGNOSIS — J9621 Acute and chronic respiratory failure with hypoxia: Secondary | ICD-10-CM | POA: Diagnosis present

## 2023-01-11 DIAGNOSIS — Z8249 Family history of ischemic heart disease and other diseases of the circulatory system: Secondary | ICD-10-CM | POA: Diagnosis not present

## 2023-01-11 DIAGNOSIS — Z79899 Other long term (current) drug therapy: Secondary | ICD-10-CM

## 2023-01-11 DIAGNOSIS — I509 Heart failure, unspecified: Secondary | ICD-10-CM | POA: Diagnosis not present

## 2023-01-11 DIAGNOSIS — Z90710 Acquired absence of both cervix and uterus: Secondary | ICD-10-CM

## 2023-01-11 DIAGNOSIS — K219 Gastro-esophageal reflux disease without esophagitis: Secondary | ICD-10-CM | POA: Diagnosis present

## 2023-01-11 DIAGNOSIS — H409 Unspecified glaucoma: Secondary | ICD-10-CM | POA: Diagnosis present

## 2023-01-11 DIAGNOSIS — I4891 Unspecified atrial fibrillation: Secondary | ICD-10-CM

## 2023-01-11 DIAGNOSIS — H543 Unqualified visual loss, both eyes: Secondary | ICD-10-CM | POA: Diagnosis present

## 2023-01-11 DIAGNOSIS — N179 Acute kidney failure, unspecified: Secondary | ICD-10-CM | POA: Diagnosis present

## 2023-01-11 DIAGNOSIS — Z818 Family history of other mental and behavioral disorders: Secondary | ICD-10-CM

## 2023-01-11 DIAGNOSIS — I7100 Dissection of unspecified site of aorta: Secondary | ICD-10-CM | POA: Diagnosis present

## 2023-01-11 DIAGNOSIS — Z66 Do not resuscitate: Secondary | ICD-10-CM | POA: Diagnosis present

## 2023-01-11 DIAGNOSIS — E785 Hyperlipidemia, unspecified: Secondary | ICD-10-CM | POA: Diagnosis present

## 2023-01-11 DIAGNOSIS — Z961 Presence of intraocular lens: Secondary | ICD-10-CM | POA: Diagnosis present

## 2023-01-11 DIAGNOSIS — I7121 Aneurysm of the ascending aorta, without rupture: Secondary | ICD-10-CM | POA: Diagnosis present

## 2023-01-11 DIAGNOSIS — R0603 Acute respiratory distress: Principal | ICD-10-CM

## 2023-01-11 DIAGNOSIS — E039 Hypothyroidism, unspecified: Secondary | ICD-10-CM | POA: Diagnosis present

## 2023-01-11 DIAGNOSIS — F32A Depression, unspecified: Secondary | ICD-10-CM | POA: Diagnosis present

## 2023-01-11 DIAGNOSIS — F419 Anxiety disorder, unspecified: Secondary | ICD-10-CM | POA: Diagnosis present

## 2023-01-11 DIAGNOSIS — R0602 Shortness of breath: Secondary | ICD-10-CM | POA: Diagnosis not present

## 2023-01-11 DIAGNOSIS — Z9841 Cataract extraction status, right eye: Secondary | ICD-10-CM

## 2023-01-11 DIAGNOSIS — Z8614 Personal history of Methicillin resistant Staphylococcus aureus infection: Secondary | ICD-10-CM

## 2023-01-11 DIAGNOSIS — Z6841 Body Mass Index (BMI) 40.0 and over, adult: Secondary | ICD-10-CM | POA: Diagnosis not present

## 2023-01-11 DIAGNOSIS — Z8719 Personal history of other diseases of the digestive system: Secondary | ICD-10-CM

## 2023-01-11 DIAGNOSIS — Z1152 Encounter for screening for COVID-19: Secondary | ICD-10-CM | POA: Diagnosis not present

## 2023-01-11 DIAGNOSIS — Z9884 Bariatric surgery status: Secondary | ICD-10-CM | POA: Diagnosis not present

## 2023-01-11 DIAGNOSIS — M1990 Unspecified osteoarthritis, unspecified site: Secondary | ICD-10-CM | POA: Diagnosis present

## 2023-01-11 DIAGNOSIS — J9691 Respiratory failure, unspecified with hypoxia: Secondary | ICD-10-CM | POA: Diagnosis not present

## 2023-01-11 DIAGNOSIS — I4819 Other persistent atrial fibrillation: Secondary | ICD-10-CM | POA: Diagnosis present

## 2023-01-11 DIAGNOSIS — Z96641 Presence of right artificial hip joint: Secondary | ICD-10-CM | POA: Diagnosis present

## 2023-01-11 HISTORY — DX: Dissection of unspecified site of aorta: I71.00

## 2023-01-11 LAB — COMPREHENSIVE METABOLIC PANEL
ALT: 26 U/L (ref 0–44)
AST: 22 U/L (ref 15–41)
Albumin: 3.2 g/dL — ABNORMAL LOW (ref 3.5–5.0)
Alkaline Phosphatase: 102 U/L (ref 38–126)
Anion gap: 12 (ref 5–15)
BUN: 14 mg/dL (ref 8–23)
CO2: 20 mmol/L — ABNORMAL LOW (ref 22–32)
Calcium: 8.7 mg/dL — ABNORMAL LOW (ref 8.9–10.3)
Chloride: 107 mmol/L (ref 98–111)
Creatinine, Ser: 0.93 mg/dL (ref 0.44–1.00)
GFR, Estimated: >60 mL/min (ref >60)
Glucose, Bld: 130 mg/dL — ABNORMAL HIGH (ref 70–99)
Potassium: 4 mmol/L (ref 3.5–5.1)
Sodium: 139 mmol/L (ref 135–145)
Total Bilirubin: 0.7 mg/dL (ref 0.3–1.2)
Total Protein: 6.6 g/dL (ref 6.5–8.1)

## 2023-01-11 LAB — CBC WITH DIFFERENTIAL/PLATELET
Abs Immature Granulocytes: 0.07 10*3/uL (ref 0.00–0.07)
Basophils Absolute: 0.1 10*3/uL (ref 0.0–0.1)
Basophils Relative: 1 %
Eosinophils Absolute: 0.1 10*3/uL (ref 0.0–0.5)
Eosinophils Relative: 1 %
HCT: 41.9 % (ref 36.0–46.0)
Hemoglobin: 13 g/dL (ref 12.0–15.0)
Immature Granulocytes: 1 %
Lymphocytes Relative: 8 %
Lymphs Abs: 0.9 10*3/uL (ref 0.7–4.0)
MCH: 29.7 pg (ref 26.0–34.0)
MCHC: 31 g/dL (ref 30.0–36.0)
MCV: 95.9 fL (ref 80.0–100.0)
Monocytes Absolute: 0.9 10*3/uL (ref 0.1–1.0)
Monocytes Relative: 8 %
Neutro Abs: 9.6 10*3/uL — ABNORMAL HIGH (ref 1.7–7.7)
Neutrophils Relative %: 81 %
Platelets: 295 10*3/uL (ref 150–400)
RBC: 4.37 MIL/uL (ref 3.87–5.11)
RDW: 14.6 % (ref 11.5–15.5)
WBC: 11.7 10*3/uL — ABNORMAL HIGH (ref 4.0–10.5)
nRBC: 0.2 % (ref 0.0–0.2)

## 2023-01-11 LAB — TSH: TSH: 7.045 u[IU]/mL — ABNORMAL HIGH (ref 0.350–4.500)

## 2023-01-11 LAB — RESP PANEL BY RT-PCR (RSV, FLU A&B, COVID)  RVPGX2
Influenza A by PCR: NEGATIVE
Influenza B by PCR: NEGATIVE
Resp Syncytial Virus by PCR: NEGATIVE
SARS Coronavirus 2 by RT PCR: NEGATIVE

## 2023-01-11 LAB — BRAIN NATRIURETIC PEPTIDE: B Natriuretic Peptide: 890.7 pg/mL — ABNORMAL HIGH (ref 0.0–100.0)

## 2023-01-11 LAB — PHOSPHORUS: Phosphorus: 3 mg/dL (ref 2.5–4.6)

## 2023-01-11 LAB — MAGNESIUM: Magnesium: 2 mg/dL (ref 1.7–2.4)

## 2023-01-11 MED ORDER — SODIUM CHLORIDE 0.9 % IV SOLN: 250.0000 mL | INTRAVENOUS | Status: DC | PRN

## 2023-01-11 MED ORDER — TRAZODONE HCL 50 MG PO TABS
50.0000 mg | ORAL_TABLET | Freq: Every day | ORAL | Status: DC
  Administered 2023-01-11 – 2023-01-14 (×4): 50 mg via ORAL
  Filled 2023-01-11 (×4): qty 1

## 2023-01-11 MED ORDER — DIGOXIN 0.25 MG/ML IJ SOLN
0.1250 mg | Freq: Four times a day (QID) | INTRAMUSCULAR | Status: AC
  Administered 2023-01-11 (×2): 0.125 mg via INTRAVENOUS
  Filled 2023-01-11 (×2): qty 2

## 2023-01-11 MED ORDER — IPRATROPIUM BROMIDE 0.02 % IN SOLN
RESPIRATORY_TRACT | Status: AC
  Filled 2023-01-11: qty 2.5

## 2023-01-11 MED ORDER — MAGNESIUM OXIDE -MG SUPPLEMENT 400 (240 MG) MG PO TABS
400.0000 mg | ORAL_TABLET | Freq: Two times a day (BID) | ORAL | Status: DC
  Administered 2023-01-11 – 2023-01-13 (×4): 400 mg via ORAL
  Filled 2023-01-11 (×4): qty 1

## 2023-01-11 MED ORDER — METOPROLOL TARTRATE 25 MG PO TABS: 25.0000 mg | ORAL_TABLET | Freq: Two times a day (BID) | ORAL | Status: DC

## 2023-01-11 MED ORDER — ALBUTEROL SULFATE (2.5 MG/3ML) 0.083% IN NEBU
5.0000 mg | INHALATION_SOLUTION | Freq: Once | RESPIRATORY_TRACT | Status: AC
  Filled 2023-01-11: qty 6

## 2023-01-11 MED ORDER — LEVOTHYROXINE SODIUM 88 MCG PO TABS
88.0000 ug | ORAL_TABLET | Freq: Every day | ORAL | Status: DC
  Administered 2023-01-12 – 2023-01-15 (×4): 88 ug via ORAL
  Filled 2023-01-11 (×4): qty 1

## 2023-01-11 MED ORDER — AMIODARONE HCL 200 MG PO TABS: 200.0000 mg | ORAL_TABLET | Freq: Every day | ORAL | Status: DC

## 2023-01-11 MED ORDER — FUROSEMIDE 10 MG/ML IJ SOLN
40.0000 mg | Freq: Once | INTRAMUSCULAR | Status: AC
  Administered 2023-01-11: 40 mg via INTRAVENOUS
  Filled 2023-01-11: qty 4

## 2023-01-11 MED ORDER — FLUOXETINE HCL 20 MG PO CAPS
40.0000 mg | ORAL_CAPSULE | Freq: Every day | ORAL | Status: DC
  Administered 2023-01-12 – 2023-01-15 (×4): 40 mg via ORAL
  Filled 2023-01-11 (×4): qty 2

## 2023-01-11 MED ORDER — SODIUM CHLORIDE 0.9% FLUSH
3.0000 mL | Freq: Two times a day (BID) | INTRAVENOUS | Status: DC
  Administered 2023-01-11 – 2023-01-15 (×8): 3 mL via INTRAVENOUS

## 2023-01-11 MED ORDER — ONDANSETRON HCL 4 MG/2ML IJ SOLN: 4.0000 mg | Freq: Four times a day (QID) | INTRAMUSCULAR | Status: DC | PRN

## 2023-01-11 MED ORDER — PANTOPRAZOLE SODIUM 40 MG PO TBEC
40.0000 mg | DELAYED_RELEASE_TABLET | Freq: Every day | ORAL | Status: DC
  Administered 2023-01-12 – 2023-01-15 (×4): 40 mg via ORAL
  Filled 2023-01-11 (×4): qty 1

## 2023-01-11 MED ORDER — ALBUTEROL SULFATE (2.5 MG/3ML) 0.083% IN NEBU
INHALATION_SOLUTION | RESPIRATORY_TRACT | Status: AC
  Administered 2023-01-11: 5 mg via RESPIRATORY_TRACT
  Filled 2023-01-11: qty 6

## 2023-01-11 MED ORDER — LATANOPROST 0.005 % OP SOLN
1.0000 [drp] | Freq: Every day | OPHTHALMIC | Status: DC
  Administered 2023-01-11 – 2023-01-14 (×4): 1 [drp] via OPHTHALMIC
  Filled 2023-01-11: qty 2.5

## 2023-01-11 MED ORDER — ACETAMINOPHEN 325 MG PO TABS: 650.0000 mg | ORAL_TABLET | ORAL | Status: DC | PRN

## 2023-01-11 MED ORDER — ENOXAPARIN SODIUM 40 MG/0.4ML IJ SOSY
40.0000 mg | PREFILLED_SYRINGE | INTRAMUSCULAR | Status: DC
  Administered 2023-01-11 – 2023-01-12 (×2): 40 mg via SUBCUTANEOUS
  Filled 2023-01-11 (×2): qty 0.4

## 2023-01-11 MED ORDER — FUROSEMIDE 10 MG/ML IJ SOLN
40.0000 mg | Freq: Two times a day (BID) | INTRAMUSCULAR | Status: DC
  Administered 2023-01-11 – 2023-01-13 (×4): 40 mg via INTRAVENOUS
  Filled 2023-01-11 (×4): qty 4

## 2023-01-11 MED ORDER — AMIODARONE LOAD VIA INFUSION
150.0000 mg | Freq: Once | INTRAVENOUS | Status: AC
  Administered 2023-01-11: 150 mg via INTRAVENOUS
  Filled 2023-01-11: qty 83.34

## 2023-01-11 MED ORDER — AMIODARONE HCL IN DEXTROSE 360-4.14 MG/200ML-% IV SOLN
60.0000 mg/h | INTRAVENOUS | Status: AC
  Administered 2023-01-11 (×2): 60 mg/h via INTRAVENOUS
  Filled 2023-01-11 (×2): qty 200

## 2023-01-11 MED ORDER — METOPROLOL TARTRATE 25 MG PO TABS
37.5000 mg | ORAL_TABLET | Freq: Two times a day (BID) | ORAL | Status: DC
  Administered 2023-01-11 – 2023-01-13 (×4): 37.5 mg via ORAL
  Filled 2023-01-11 (×6): qty 1
  Filled 2023-01-11: qty 2
  Filled 2023-01-11: qty 1

## 2023-01-11 MED ORDER — ROSUVASTATIN CALCIUM 5 MG PO TABS
10.0000 mg | ORAL_TABLET | Freq: Every day | ORAL | Status: DC
  Administered 2023-01-12 – 2023-01-15 (×4): 10 mg via ORAL
  Filled 2023-01-11 (×4): qty 2

## 2023-01-11 MED ORDER — AMIODARONE HCL IN DEXTROSE 360-4.14 MG/200ML-% IV SOLN
30.0000 mg/h | INTRAVENOUS | Status: DC
  Administered 2023-01-12 – 2023-01-13 (×3): 30 mg/h via INTRAVENOUS
  Filled 2023-01-11 (×3): qty 200

## 2023-01-11 MED ORDER — SODIUM CHLORIDE 0.9% FLUSH: 3.0000 mL | INTRAVENOUS | Status: DC | PRN

## 2023-01-11 NOTE — ED Provider Notes (Signed)
[place] EMERGENCY DEPARTMENT AT [facility] Provider Note   CSN: [id number] Arrival date & time: 01/11/23  1047     History  Chief Complaint  Patient presents with   Respiratory Distress    Lauren Clark is a 67 y.o. female.  HPI Patient presents in respiratory distress via EMS. EMS reports the patient complained of dyspnea, chest pain, before normal with exertion.  On arrival patient was hypoxic in the 70% range, with increased work of breathing.  She received CPAP, albuterol, had some improvement.  Blood pressure was notably elevated, greater than 200 systolic on arrival, patient received nitroglycerin, but no additional blood pressure check was provided en route. Patient currently denies chest pain, notes difficulty breathing. She notes that she was recently hospitalized with aortic dissection.     Home Medications Prior to Admission medications   Medication Sig Start Date End Date Taking? Authorizing Provider  amiodarone (PACERONE) 200 MG tablet Take 1 tablet (200 mg total) by mouth daily. 11/19/22 12/19/22  Lanae Boast, MD  Calcium Carbonate (CALCIUM 600 PO) Take 600 mg by mouth daily.    [provider]  Cyanocobalamin (VITAMIN B-12) 2500 MCG SUBL Place 2,500 mcg under the tongue daily.    [provider]  FLUoxetine (PROZAC) 40 MG capsule Take 40 mg by mouth daily. 08/29/22   [provider]  HYDROcodone-acetaminophen (NORCO/VICODIN) 5-325 MG tablet Take 1 tablet by mouth every 6 (six) hours as needed for up to 15 doses for severe pain. Patient not taking: Reported on 01/04/2023 11/19/22   Lanae Boast, MD  latanoprost (XALATAN) 0.005 % ophthalmic solution Place 1 drop into the right eye at bedtime.     [provider]  levothyroxine (SYNTHROID, LEVOTHROID) 88 MCG tablet Take 88 mcg by mouth daily before breakfast.     [provider]  magnesium oxide (MAG-OX) 400 (240 Mg) MG tablet Take 400 mg by mouth 2 (two) times daily.     [provider]  metoprolol tartrate (LOPRESSOR) 25 MG tablet Take 1 tablet (25 mg total) by mouth 2 (two) times daily. 01/04/23   Alleen Borne, MD  metoprolol tartrate (LOPRESSOR) 25 MG tablet Take 1 tablet (25 mg total) by mouth 2 (two) times daily. 01/04/23   Alleen Borne, MD  pantoprazole (PROTONIX) 40 MG tablet Take 1 tablet (40 mg total) by mouth daily. 11/19/22 11/19/23  Lanae Boast, MD  rosuvastatin (CRESTOR) 10 MG tablet Take 10 mg by mouth daily.    [provider]  Skin Protectants, Misc. (MINERIN CREME EX) Apply 1 application  topically daily.    [provider]  traZODone (DESYREL) 50 MG tablet Take 50 mg by mouth at bedtime.    [provider]  VICTOZA 18 MG/3ML SOPN Inject 1.2 mg into the skin daily. Patient not taking: Reported on 01/04/2023 08/11/21   [provider]      Allergies    Crestor [rosuvastatin calcium], Lipitor [atorvastatin], and Hctz [hydrochlorothiazide]    Review of Systems   Review of Systems  Unable to perform ROS: Acuity of condition    Physical Exam Updated Vital Signs BP 138/86   Pulse (!) 113   Temp 98 F (36.7 C) (Oral)   Resp 18   Ht 5' (1.524 m)   Wt 97.5 kg   SpO2 97%   BMI 41.98 kg/m  Physical Exam Vitals and nursing note reviewed.  Constitutional:      General: She is not in acute distress.  Appearance: She is well-developed. She is obese. She is ill-appearing and diaphoretic.  HENT:     Head: Normocephalic and atraumatic.  Eyes:     Conjunctiva/sclera: Conjunctivae normal.  Cardiovascular:     Rate and Rhythm: Regular rhythm. Tachycardia present.  Pulmonary:     Effort: Respiratory distress present.     Breath sounds: Normal breath sounds. No stridor.  Abdominal:     General: There is no distension.  Musculoskeletal:     Left lower leg: Edema present.  Skin:    General: Skin is warm.  Neurological:     Mental Status: She is alert and oriented to person, place, and time.      Cranial Nerves: No cranial nerve deficit.  Psychiatric:        Mood and Affect: Mood normal.     ED Results / Procedures / Treatments   Labs (all labs ordered are listed, but only abnormal results are displayed) Labs Reviewed  COMPREHENSIVE METABOLIC PANEL - Abnormal; Notable for the following components:      Result Value   CO2 20 (*)    Glucose, Bld 130 (*)    Calcium 8.7 (*)    Albumin 3.2 (*)    All other components within normal limits  CBC WITH DIFFERENTIAL/PLATELET - Abnormal; Notable for the following components:   WBC 11.7 (*)    Neutro Abs 9.6 (*)    All other components within normal limits  RESP PANEL BY RT-PCR (RSV, FLU A&B, COVID)  [id number]  BRAIN NATRIURETIC PEPTIDE    EKG EKG Interpretation Date/Time:  Wednesday January 11 2023 10:59:26 EDT Ventricular Rate:  59 PR Interval:  205 QRS Duration:  93 QT Interval:  491 QTC Calculation: 487 R Axis:   75  Text Interpretation: Sinus rhythm Probable left atrial enlargement Low voltage, extremity leads Abnormal R-wave progression, late transition Nonspecific T abnrm, anterolateral leads ST elevation, consider inferior injury Borderline prolonged QT interval Confirmed by Gerhard Munch [phone]) on 01/11/2023 11:08:04 AM  Radiology DG Chest Port 1 View  Result Date: 01/11/2023 CLINICAL DATA:  Provided history: Shortness of breath. Increased leg swelling. EXAM: PORTABLE CHEST 1 VIEW COMPARISON:  Prior chest radiographs 11/15/2022 and earlier. Chest CT 01/04/2023. FINDINGS: Cardiomegaly. Central pulmonary vascular congestion. Interstitial and ill-defined airspace opacities bilaterally (with a mid and lower lung predominance). The abdominal soft tissues partially obscure the left lateral costophrenic angle. Within this limitation, there is no appreciable pleural effusion. No evidence of pneumothorax. No acute osseous abnormality identified. IMPRESSION: 1. Cardiomegaly with central pulmonary vascular congestion. 2. Interstitial and  ill-defined airspace opacities bilaterally (with a mid and lower lung predominance), which may reflect pulmonary edema and/or pneumonia. 3. The abdominal soft tissues partially obscure the left lateral costophrenic angle. Within this limitation, there is no appreciable pleural effusion. Electronically Signed   By: Jackey Loge D.O.   On: 01/11/2023 11:37    Procedures Procedures    Medications Ordered in ED Medications  digoxin (LANOXIN) 0.25 MG/ML injection 0.125 mg (0.125 mg Intravenous Given 01/11/23 1652)  amiodarone (PACERONE) tablet 200 mg (has no administration in time range)  rosuvastatin (CRESTOR) tablet 10 mg (has no administration in time range)  FLUoxetine (PROZAC) capsule 40 mg (has no administration in time range)  traZODone (DESYREL) tablet 50 mg (has no administration in time range)  levothyroxine (SYNTHROID) tablet 88 mcg (has no administration in time range)  pantoprazole (PROTONIX) EC tablet 40 mg (has no administration in time range)  magnesium oxide (MAG-OX) tablet 400 mg (  has no administration in time range)  sodium chloride flush (NS) 0.9 % injection 3 mL (has no administration in time range)  sodium chloride flush (NS) 0.9 % injection 3 mL (has no administration in time range)  0.9 %  sodium chloride infusion (has no administration in time range)  acetaminophen (TYLENOL) tablet 650 mg (has no administration in time range)  ondansetron (ZOFRAN) injection 4 mg (has no administration in time range)  enoxaparin (LOVENOX) injection 40 mg (has no administration in time range)  furosemide (LASIX) injection 40 mg (has no administration in time range)  metoprolol tartrate (LOPRESSOR) tablet 37.5 mg (37.5 mg Oral Given 01/11/23 1650)  furosemide (LASIX) injection 40 mg (40 mg Intravenous Given 01/11/23 1110)  albuterol (PROVENTIL) (2.5 MG/3ML) 0.083% nebulizer solution 5 mg (5 mg Nebulization Given 01/11/23 1104)  ipratropium (ATROVENT) 0.02 % nebulizer solution (  Given 01/11/23  1104)    ED Course/ Medical Decision Making/ A&P                             Medical Decision Making Obese adult female with recent hospitalization for aortic dissection presents with dyspnea, chest pain, hypertension.  Broad differential including hypertensive pulmonary edema, worsening aortic dissection, pneumonia, ACS, PE all considered. Patient previously deemed not a candidate for anticoagulation by cardiothoracic surgery, nor a candidate for surgery either. Patient placed on monitors, continuous CPAP started. Bronchodilator started. Cardiac variable 70, 110 A-fib abnormal, with episodes of sinus rhythm Pulse ox 96% with supplemental oxygen abnormal   Amount and/or Complexity of Data Reviewed Independent Historian: EMS External Data Reviewed: notes. Labs: ordered. Decision-making details documented in ED Course. Radiology: ordered and independent interpretation performed. Decision-making details documented in ED Course. ECG/medicine tests: ordered and independent interpretation performed. Decision-making details documented in ED Course.  Risk Prescription drug management. Decision regarding hospitalization. Diagnosis or treatment significantly limited by social determinants of health.  Per chart review last hospitalization: Aortic hematoma versus type B dissection with thrombosed false lumen Chest pain and back pain Hypertensive emergency: Per cardiothoracic surgery patient is a high risk for surgical intervention advised medical management/BP optimization. Dr. Lavinia Sharps advised against anticoagulation > repeat CT studies 5/17 showed worsening of dissection/hematoma reviewed by Dr. Dorris Fetch, and patient is not a candidate for surgical intervention explained the risk of anticoagulation which may lead to worsening of her dissection/bleeding and a this time holding off on resuming Eliquis patient is agreeable, seen by palliative care plan for outpatient palliative care follow-up  physical to evaluate and hemodynamically stable. CT SURGERY number has been provided for follow-up  Brief/Interim Summary: 67 year old woman with a past medical history of hypothyroidism, hypertension, and hyperlipidemia who presents with chest pain of acute onset  after waking up from her sleep around 6 AM. Patient came to the ED had worsening of symptoms- D-dimer was elevated so CT PE study was obtained> showed no pulmonary embolism but did show intramural thrombus of the ascending aorta versus type aortic dissection. She was found to be hypertensive. Her chest pain has gotten a little better with blood pressure control. CT surgery was consulted and recommended admission to the ICU and medical management of blood pressure.    Update: Patient now off BiPAP.  She is markedly better, blood pressure has improved she has diuresed substantial amounts.  X-ray again reviewed, discussed, no evidence for pneumonia, findings consistent with pulmonary edema, likely multifactorial, consideration of heart failure and hypertensive urgency contributing. With interventions here including  those described above patient had marked improvement, though with persistent mild dyspnea she was admitted for further monitoring, management.          Final Clinical Impression(s) / ED Diagnoses Final diagnoses:  Respiratory distress   CRITICAL CARE Performed by: Gerhard Munch Total critical care time: 35 minutes Critical care time was exclusive of separately billable procedures and treating other patients. Critical care was necessary to treat or prevent imminent or life-threatening deterioration. Critical care was time spent personally by me on the following activities: development of treatment plan with patient and/or surrogate as well as nursing, discussions with consultants, evaluation of patient's response to treatment, examination of patient, obtaining history from patient or surrogate, ordering and performing  treatments and interventions, ordering and review of laboratory studies, ordering and review of radiographic studies, pulse oximetry and re-evaluation of patient's condition.    Gerhard Munch, MD 01/11/23 1655

## 2023-01-11 NOTE — ED Notes (Signed)
Cardiology at bedside.

## 2023-01-11 NOTE — ED Notes (Signed)
ED TO INPATIENT HANDOFF REPORT  ED Nurse Name and Phone #: Rhondalyn Clingan [phone]  S Name/Age/Gender Lauren Clark 67 y.o. female Room/Bed: 009C/009C  Code Status   Code Status: DNR  Home/SNF/Other Home Patient oriented to: self, place, time, and situation Is this baseline? Yes   Triage Complete: Triage complete  Chief Complaint CHF (congestive heart failure) (HCC) [I50.9]  Triage Note Exertional shortness of breath for a month. Not on O2 at home. O2 sat dropped in the 70s. Increased swelling to left leg. On cpap with EMS with O2 at 91%. EMS gave 1 nitroglycerin SL and 5mg  albuterol en route.    Allergies Allergies  Allergen Reactions   Crestor [Rosuvastatin Calcium] Other (See Comments)    Muscle aches     Lipitor [Atorvastatin] Other (See Comments)    Muscle aches    Hctz [Hydrochlorothiazide] Other (See Comments)    Pt doesn't remember    Level of Care/Admitting Diagnosis ED Disposition     ED Disposition  Admit   Condition  --   Comment  Hospital Area: [facility] [[id number]]  Level of Care: Progressive [102]  Admit to Progressive based on following criteria: CARDIOVASCULAR & THORACIC of moderate stability with acute coronary syndrome symptoms/low risk myocardial infarction/hypertensive urgency/arrhythmias/heart failure potentially compromising stability and stable post cardiovascular intervention patients.  May admit patient to [facility] or [facility] if equivalent level of care is available:: No  Covid Evaluation: Confirmed COVID Negative  Diagnosis: CHF (congestive heart failure) [facility]) [[id number]]  Admitting Physician: Emeline General [[id number]]  Attending Physician: Emeline General [[id number]]  Certification:: I certify this patient will need inpatient services for at least 2 midnights  Estimated Length of Stay: 2          B Medical/Surgery History Past Medical History:  Diagnosis Date   Anemia    s/p knee surgery   Anxiety    Aortic  dissection (HCC)    Arthritis    Depression    GERD (gastroesophageal reflux disease)    Glaucoma    right eye uses drops   History of blood transfusion    [facility]   History of MRSA infection    Hyperlipidemia    Hypertension    Hypothyroidism    Impaired vision in both eyes    blind left eye, limited vision right eye   Morbid obesity (HCC)    s/p Gastric sleeve- 8'17(loss thus far approx. 100 lbs)   Peripheral vascular disease (HCC)    dvt right leg   Pre-diabetes    prior to gastric sleeve   Past Surgical History:  Procedure Laterality Date   ABDOMINAL HYSTERECTOMY  1981   with bilateral BSO   CATARACT EXTRACTION Right 2010   [facility]   COLONOSCOPY     DEBRIDEMENT AND CLOSURE WOUND Right 07/07/2014   Dr. Loralie Champagne   EYE SURGERY     bil cataraction with IOL as a child, cataract surgery late 1990- right   JOINT REPLACEMENT Right 2016   knee   KNEE DEBRIDEMENT Right 06/22/2014   Dr. Linda Hedges with wound vac   KNEE SURGERY     i&d KNEE POST REPLACEMENT   LAPAROSCOPIC GASTRIC SLEEVE RESECTION N/A 02/29/2016   Procedure: LAPAROSCOPIC GASTRIC SLEEVE RESECTION, UPPER ENDO;  Surgeon: Gaynelle Adu, MD;  Location: [facility] ORS;  Service: General;  Laterality: N/A;   TONSILLECTOMY     TOTAL HIP ARTHROPLASTY Right 06/14/2016   Procedure: RIGHT TOTAL  HIP ARTHROPLASTY ANTERIOR APPROACH;  Surgeon: Durene Romans, MD;  Location: [facility] ORS;  Service: Orthopedics;  Laterality: Right;   TOTAL KNEE ARTHROPLASTY Left 09/05/2017   Procedure: LEFT TOTAL KNEE ARTHROPLASTY;  Surgeon: Durene Romans, MD;  Location: [facility]S;  Service: Orthopedics;  Laterality: Left;  Adductor Block   UPPER GI ENDOSCOPY  01/25/2016     A IV Location/Drains/Wounds Patient Lines/Drains/Airways Status     Active Line/Drains/Airways     Name Placement date Placement time Site Days   Peripheral IV 01/11/23 20 G Anterior;Right Hand 01/11/23  1050  Hand  less than 1             Intake/Output Last 24 hours  Intake/Output Summary (Last 24 hours) at 01/11/2023 1759 Last data filed at 01/11/2023 1402 Gross per 24 hour  Intake --  Output 1100 ml  Net -1100 ml    Labs/Imaging Results for orders placed or performed during the hospital encounter of 01/11/23 (from the past 48 hour(s))  Comprehensive metabolic panel     Status: Abnormal   Collection Time: 01/11/23 10:53 AM  Result Value Ref Range   Sodium 139 135 - 145 mmol/L   Potassium 4.0 3.5 - 5.1 mmol/L   Chloride 107 98 - 111 mmol/L   CO2 20 (L) 22 - 32 mmol/L   Glucose, Bld 130 (H) 70 - 99 mg/dL    Comment: Glucose reference range applies only to samples taken after fasting for at least 8 hours.   BUN 14 8 - 23 mg/dL   Creatinine, Ser 1.61 0.44 - 1.00 mg/dL   Calcium 8.7 (L) 8.9 - 10.3 mg/dL   Total Protein 6.6 6.5 - 8.1 g/dL   Albumin 3.2 (L) 3.5 - 5.0 g/dL   AST 22 15 - 41 U/L   ALT 26 0 - 44 U/L   Alkaline Phosphatase 102 38 - 126 U/L   Total Bilirubin 0.7 0.3 - 1.2 mg/dL   GFR, Estimated >09 >60 mL/min    Comment: (NOTE) Calculated using the CKD-EPI Creatinine Equation (2021)    Anion gap 12 5 - 15    Comment: Performed at [facility], [address] [place], [place] [address]  CBC with Differential/Platelet     Status: Abnormal   Collection Time: 01/11/23 10:53 AM  Result Value Ref Range   WBC 11.7 (H) 4.0 - 10.5 K/uL   RBC 4.37 3.87 - 5.11 MIL/uL   Hemoglobin 13.0 12.0 - 15.0 g/dL   HCT 81.1 91.4 - 78.2 %   MCV 95.9 80.0 - 100.0 fL   MCH 29.7 26.0 - 34.0 pg   MCHC 31.0 30.0 - 36.0 g/dL   RDW 95.6 21.3 - 08.6 %   Platelets 295 150 - 400 K/uL   nRBC 0.2 0.0 - 0.2 %   Neutrophils Relative % 81 %   Neutro Abs 9.6 (H) 1.7 - 7.7 K/uL   Lymphocytes Relative 8 %   Lymphs Abs 0.9 0.7 - 4.0 K/uL   Monocytes Relative 8 %   Monocytes Absolute 0.9 0.1 - 1.0 K/uL   Eosinophils Relative 1 %   Eosinophils Absolute 0.1 0.0 - 0.5 K/uL   Basophils Relative 1 %   Basophils Absolute 0.1  0.0 - 0.1 K/uL   Immature Granulocytes 1 %   Abs Immature Granulocytes 0.07 0.00 - 0.07 K/uL    Comment: Performed at [facility], [address] [place], [place] [address]  Resp panel by RT-PCR (RSV, Flu A&B, Covid)  Anterior Nasal Swab     Status: None   Collection Time: 01/11/23 10:54 AM   Specimen: Anterior Nasal Swab  Result Value Ref Range   SARS Coronavirus 2 by RT PCR NEGATIVE NEGATIVE   Influenza A by PCR NEGATIVE NEGATIVE   Influenza B by PCR NEGATIVE NEGATIVE    Comment: (NOTE) The Xpert Xpress SARS-CoV-2/FLU/RSV plus assay is intended as an aid in the diagnosis of influenza from Nasopharyngeal swab specimens and should not be used as a sole basis for treatment. Nasal washings and aspirates are unacceptable for Xpert Xpress SARS-CoV-2/FLU/RSV testing.  Fact Sheet for Patients: BloggerCourse.com  Fact Sheet for Healthcare Providers: SeriousBroker.it  This test is not yet approved or cleared by the [place] FDA and has been authorized for detection and/or diagnosis of SARS-CoV-2 by FDA under an Emergency Use Authorization (EUA). This EUA will remain in effect (meaning this test can be used) for the duration of the COVID-19 declaration under Section 564(b)(1) of the Act, 21 U.S.C. section 360bbb-3(b)(1), unless the authorization is terminated or revoked.     Resp Syncytial Virus by PCR NEGATIVE NEGATIVE    Comment: (NOTE) Fact Sheet for Patients: BloggerCourse.com  Fact Sheet for Healthcare Providers: SeriousBroker.it  This test is not yet approved or cleared by the [place] FDA and has been authorized for detection and/or diagnosis of SARS-CoV-2 by FDA under an Emergency Use Authorization (EUA). This EUA will remain in effect (meaning this test can be used) for the duration of the COVID-19 declaration under Section 564(b)(1) of the Act, 21  U.S.C. section 360bbb-3(b)(1), unless the authorization is terminated or revoked.  Performed at [facility], [address], [place], [place] [address]    DG Chest Port 1 View  Result Date: 01/11/2023 CLINICAL DATA:  Provided history: Shortness of breath. Increased leg swelling. EXAM: PORTABLE CHEST 1 VIEW COMPARISON:  Prior chest radiographs 11/15/2022 and earlier. Chest CT 01/04/2023. FINDINGS: Cardiomegaly. Central pulmonary vascular congestion. Interstitial and ill-defined airspace opacities bilaterally (with a mid and lower lung predominance). The abdominal soft tissues partially obscure the left lateral costophrenic angle. Within this limitation, there is no appreciable pleural effusion. No evidence of pneumothorax. No acute osseous abnormality identified. IMPRESSION: 1. Cardiomegaly with central pulmonary vascular congestion. 2. Interstitial and ill-defined airspace opacities bilaterally (with a mid and lower lung predominance), which may reflect pulmonary edema and/or pneumonia. 3. The abdominal soft tissues partially obscure the left lateral costophrenic angle. Within this limitation, there is no appreciable pleural effusion. Electronically Signed   By: Jackey Loge D.O.   On: 01/11/2023 11:37    Pending Labs Unresulted Labs (From admission, onward)     Start     Ordered   01/12/23 0500  Basic metabolic panel  Daily,   R     Comments: As Scheduled for 5 days    01/11/23 1624   01/11/23 1800  Magnesium  Add-on,   AD        01/11/23 1759   01/11/23 1800  TSH  Add-on,   AD        01/11/23 1759   01/11/23 1800  Phosphorus  Add-on,   AD        01/11/23 1759   01/11/23 1054  Brain natriuretic peptide  Once,   URGENT        01/11/23 1054            Vitals/Pain Today's Vitals   01/11/23 1500 01/11/23 1503 01/11/23 1530 01/11/23 1650  BP: Marland Kitchen)  144/115  119/75 138/86  Pulse: 67  (!) 31 (!) 113  Resp: (!) 32  18   Temp:  98 F (36.7 C)    TempSrc:  Oral    SpO2: 96%   97%   Weight:      Height:      PainSc:        Isolation Precautions No active isolations  Medications Medications  digoxin (LANOXIN) 0.25 MG/ML injection 0.125 mg (0.125 mg Intravenous Given 01/11/23 1652)  rosuvastatin (CRESTOR) tablet 10 mg (10 mg Oral Not Given 01/11/23 1701)  FLUoxetine (PROZAC) capsule 40 mg (40 mg Oral Not Given 01/11/23 1701)  traZODone (DESYREL) tablet 50 mg (has no administration in time range)  levothyroxine (SYNTHROID) tablet 88 mcg (has no administration in time range)  pantoprazole (PROTONIX) EC tablet 40 mg (40 mg Oral Not Given 01/11/23 1702)  magnesium oxide (MAG-OX) tablet 400 mg (has no administration in time range)  sodium chloride flush (NS) 0.9 % injection 3 mL (has no administration in time range)  sodium chloride flush (NS) 0.9 % injection 3 mL (has no administration in time range)  0.9 %  sodium chloride infusion (has no administration in time range)  acetaminophen (TYLENOL) tablet 650 mg (has no administration in time range)  ondansetron (ZOFRAN) injection 4 mg (has no administration in time range)  enoxaparin (LOVENOX) injection 40 mg (has no administration in time range)  furosemide (LASIX) injection 40 mg (has no administration in time range)  metoprolol tartrate (LOPRESSOR) tablet 37.5 mg (37.5 mg Oral Given 01/11/23 1650)  amiodarone (NEXTERONE) 1.8 mg/mL load via infusion 150 mg (has no administration in time range)    Followed by  amiodarone (NEXTERONE PREMIX) 360-4.14 MG/200ML-% (1.8 mg/mL) IV infusion (has no administration in time range)    Followed by  amiodarone (NEXTERONE PREMIX) 360-4.14 MG/200ML-% (1.8 mg/mL) IV infusion (has no administration in time range)  furosemide (LASIX) injection 40 mg (40 mg Intravenous Given 01/11/23 1110)  albuterol (PROVENTIL) (2.5 MG/3ML) 0.083% nebulizer solution 5 mg (5 mg Nebulization Given 01/11/23 1104)  ipratropium (ATROVENT) 0.02 % nebulizer solution (  Given 01/11/23 1104)    Mobility walks  with device     Focused Assessments Pulmonary Assessment Handoff:  Lung sounds: Bilateral Breath Sounds: Expiratory wheezes L Breath Sounds: Inspiratory wheezes R Breath Sounds: Inspiratory wheezes O2 Device: Bi-PAP      R Recommendations: See Admitting Provider Note  Report given to:   Additional Notes:  Currently on 6L Nasal Cannula; pt baseline no oxygen at home

## 2023-01-11 NOTE — Progress Notes (Signed)
[facility] night cross cover note:   I was notified by RN of patient's request for resumption of home xalatan ophthalmic drops to the right eye for her underlying glaucoma.  I subsequently placed order to resume home xalatan ophthalmic drops, as above.     Newton Pigg, DO Hospitalist

## 2023-01-11 NOTE — H&P (Signed)
History and Physical    Lauren Clark:[id number] DOB: March 01, 1956 DOA: 01/11/2023  PCP: Ellyn Hack, MD (Confirm with patient/family/NH records and if not entered, this has to be entered at [facility] point of entry) Patient coming from: Home  I have personally briefly reviewed patient's old medical records in [facility]  Chief Complaint: SOB, palpitations  HPI: Lauren Clark is a 67 y.o. female with medical history significant of hypothyroidism, HTN, HLD, recently diagnosed ascending aorta dissection with intramural thrombosis versus aorta hematoma, PAF, presented with worsening of shortness of breath and palpitations.  Symptoms started about 3 to 4 weeks ago, with initial exertional dyspnea and progressively getting worse and now even at rest patient has significant severe shortness of breath.  Denies any cough no chest pains.  She also noticed that her legs become swelling bilaterally.  Recent several weeks she also experienced episode of palpitations.  In May 2024 she was hospitalized for hypertension emergency and CTA incidentally found ascending aorta dissection with intramural thrombosis versus ascending aorta hematoma.  She was evaluated by CT surgery and recommended conservative management.  ED Course: Hypoxic with O2 saturations in the mid 70s.  Patient was found to be in and out rapid A-fib.  Blood pressure significantly elevated.  Chest x-ray showed pulmonary edema.  K4.0, creatinine 0.9 WBC 11.7.  Patient was placed on BiPAP and 1 dose of IV Lasix 40 mg given the patient pulled out 500 urine.  Her symptoms of shortness of breath also improved.  Review of Systems: As per HPI otherwise 14 point review of systems negative.    Past Medical History:  Diagnosis Date   Anemia    s/p knee surgery   Anxiety    Aortic dissection (HCC)    Arthritis    Depression    GERD (gastroesophageal reflux disease)    Glaucoma    right eye uses drops   History of blood transfusion     [facility]   History of MRSA infection    Hyperlipidemia    Hypertension    Hypothyroidism    Impaired vision in both eyes    blind left eye, limited vision right eye   Morbid obesity (HCC)    s/p Gastric sleeve- 8'17(loss thus far approx. 100 lbs)   Peripheral vascular disease (HCC)    dvt right leg   Pre-diabetes    prior to gastric sleeve    Past Surgical History:  Procedure Laterality Date   ABDOMINAL HYSTERECTOMY  1981   with bilateral BSO   CATARACT EXTRACTION Right 2010   [facility]   COLONOSCOPY     DEBRIDEMENT AND CLOSURE WOUND Right 07/07/2014   Dr. Loralie Champagne   EYE SURGERY     bil cataraction with IOL as a child, cataract surgery late 1990- right   JOINT REPLACEMENT Right 2016   knee   KNEE DEBRIDEMENT Right 06/22/2014   Dr. Linda Hedges with wound vac   KNEE SURGERY     i&d KNEE POST REPLACEMENT   LAPAROSCOPIC GASTRIC SLEEVE RESECTION N/A 02/29/2016   Procedure: LAPAROSCOPIC GASTRIC SLEEVE RESECTION, UPPER ENDO;  Surgeon: Gaynelle Adu, MD;  Location: [facility];  Service: General;  Laterality: N/A;   TONSILLECTOMY     TOTAL HIP ARTHROPLASTY Right 06/14/2016   Procedure: RIGHT TOTAL HIP ARTHROPLASTY ANTERIOR APPROACH;  Surgeon: Durene Romans, MD;  Location: [facility];  Service: Orthopedics;  Laterality: Right;   TOTAL KNEE ARTHROPLASTY Left 09/05/2017  Procedure: LEFT TOTAL KNEE ARTHROPLASTY;  Surgeon: Durene Romans, MD;  Location: [facility];  Service: Orthopedics;  Laterality: Left;  Adductor Block   UPPER GI ENDOSCOPY  01/25/2016     reports that she has never smoked. She has never used smokeless tobacco. She reports that she does not drink alcohol and does not use drugs.  Allergies  Allergen Reactions   Crestor [Rosuvastatin Calcium] Other (See Comments)    Muscle aches     Lipitor [Atorvastatin] Other (See Comments)    Muscle aches    Hctz [Hydrochlorothiazide] Other (See Comments)    Pt doesn't remember    Family  History  Problem Relation Age of Onset   Depression Mother    Hypertension Father    Breast cancer Neg Hx      Prior to Admission medications   Medication Sig Start Date End Date Taking? Authorizing Provider  amiodarone (PACERONE) 200 MG tablet Take 1 tablet (200 mg total) by mouth daily. 11/19/22 12/19/22  Lanae Boast, MD  Calcium Carbonate (CALCIUM 600 PO) Take 600 mg by mouth daily.    [provider]  Cyanocobalamin (VITAMIN B-12) 2500 MCG SUBL Place 2,500 mcg under the tongue daily.    [provider]  FLUoxetine (PROZAC) 40 MG capsule Take 40 mg by mouth daily. 08/29/22   [provider]  HYDROcodone-acetaminophen (NORCO/VICODIN) 5-325 MG tablet Take 1 tablet by mouth every 6 (six) hours as needed for up to 15 doses for severe pain. Patient not taking: Reported on 01/04/2023 11/19/22   Lanae Boast, MD  latanoprost (XALATAN) 0.005 % ophthalmic solution Place 1 drop into the right eye at bedtime.     [provider]  levothyroxine (SYNTHROID, LEVOTHROID) 88 MCG tablet Take 88 mcg by mouth daily before breakfast.     [provider]  magnesium oxide (MAG-OX) 400 (240 Mg) MG tablet Take 400 mg by mouth 2 (two) times daily.    [provider]  metoprolol tartrate (LOPRESSOR) 25 MG tablet Take 1 tablet (25 mg total) by mouth 2 (two) times daily. 01/04/23   Alleen Borne, MD  metoprolol tartrate (LOPRESSOR) 25 MG tablet Take 1 tablet (25 mg total) by mouth 2 (two) times daily. 01/04/23   Alleen Borne, MD  pantoprazole (PROTONIX) 40 MG tablet Take 1 tablet (40 mg total) by mouth daily. 11/19/22 11/19/23  Lanae Boast, MD  rosuvastatin (CRESTOR) 10 MG tablet Take 10 mg by mouth daily.    [provider]  Skin Protectants, Misc. (MINERIN CREME EX) Apply 1 application  topically daily.    [provider]  traZODone (DESYREL) 50 MG tablet Take 50 mg by mouth at bedtime.    [provider]  VICTOZA 18 MG/3ML SOPN Inject 1.2 mg  into the skin daily. Patient not taking: Reported on 01/04/2023 08/11/21   [provider]    Physical Exam: Vitals:   01/11/23 1500 01/11/23 1503 01/11/23 1530 01/11/23 1650  BP: (!) 144/115  119/75 138/86  Pulse: 67  (!) 31 (!) 113  Resp: (!) 32  18   Temp:  98 F (36.7 C)    TempSrc:  Oral    SpO2: 96%  97%   Weight:      Height:        Constitutional: NAD, calm, comfortable Vitals:   01/11/23 1500 01/11/23 1503 01/11/23 1530 01/11/23 1650  BP: (!) 144/115  119/75 138/86  Pulse: 67  (!) 31 (!) 113  Resp: (!) 32  18  Temp:  98 F (36.7 C)    TempSrc:  Oral    SpO2: 96%  97%   Weight:      Height:       Eyes: PERRL, lids and conjunctivae normal ENMT: Mucous membranes are moist. Posterior pharynx clear of any exudate or lesions.Normal dentition.  Neck: normal, supple, no masses, no thyromegaly Respiratory: clear to auscultation bilaterally, no wheezing, fine crackles to bilateral mid levels, increasing respiratory effort. No accessory muscle use.  Cardiovascular: Regular rate and rhythm, no murmurs / rubs / gallops. 2+ extremity edema. 2+ pedal pulses. No carotid bruits.  Abdomen: no tenderness, no masses palpated. No hepatosplenomegaly. Bowel sounds positive.  Musculoskeletal: no clubbing / cyanosis. No joint deformity upper and lower extremities. Good ROM, no contractures. Normal muscle tone.  Skin: no rashes, lesions, ulcers. No induration Neurologic: CN 2-12 grossly intact. Sensation intact, DTR normal. Strength 5/5 in all 4.  Psychiatric: Normal judgment and insight. Alert and oriented x 3. Normal mood.     Labs on Admission: I have personally reviewed following labs and imaging studies  CBC: Recent Labs  Lab 01/11/23 1053  WBC 11.7*  NEUTROABS 9.6*  HGB 13.0  HCT 41.9  MCV 95.9  PLT 295   Basic Metabolic Panel: Recent Labs  Lab 01/11/23 1053  NA 139  K 4.0  CL 107  CO2 20*  GLUCOSE 130*  BUN 14  CREATININE 0.93  CALCIUM 8.7*    GFR: Estimated Creatinine Clearance: 60.6 mL/min (by C-G formula based on SCr of 0.93 mg/dL). Liver Function Tests: Recent Labs  Lab 01/11/23 1053  AST 22  ALT 26  ALKPHOS 102  BILITOT 0.7  PROT 6.6  ALBUMIN 3.2*   No results for input(s): "LIPASE", "AMYLASE" in the last 168 hours. No results for input(s): "AMMONIA" in the last 168 hours. Coagulation Profile: No results for input(s): "INR", "PROTIME" in the last 168 hours. Cardiac Enzymes: No results for input(s): "CKTOTAL", "CKMB", "CKMBINDEX", "TROPONINI" in the last 168 hours. BNP (last 3 results) No results for input(s): "PROBNP" in the last 8760 hours. HbA1C: No results for input(s): "HGBA1C" in the last 72 hours. CBG: No results for input(s): "GLUCAP" in the last 168 hours. Lipid Profile: No results for input(s): "CHOL", "HDL", "LDLCALC", "TRIG", "CHOLHDL", "LDLDIRECT" in the last 72 hours. Thyroid Function Tests: No results for input(s): "TSH", "T4TOTAL", "FREET4", "T3FREE", "THYROIDAB" in the last 72 hours. Anemia Panel: No results for input(s): "VITAMINB12", "FOLATE", "FERRITIN", "TIBC", "IRON", "RETICCTPCT" in the last 72 hours. Urine analysis: No results found for: "COLORURINE", "APPEARANCEUR", "LABSPEC", "PHURINE", "GLUCOSEU", "HGBUR", "BILIRUBINUR", "KETONESUR", "PROTEINUR", "UROBILINOGEN", "NITRITE", "LEUKOCYTESUR"  Radiological Exams on Admission: DG Chest Port 1 View  Result Date: 01/11/2023 CLINICAL DATA:  Provided history: Shortness of breath. Increased leg swelling. EXAM: PORTABLE CHEST 1 VIEW COMPARISON:  Prior chest radiographs 11/15/2022 and earlier. Chest CT 01/04/2023. FINDINGS: Cardiomegaly. Central pulmonary vascular congestion. Interstitial and ill-defined airspace opacities bilaterally (with a mid and lower lung predominance). The abdominal soft tissues partially obscure the left lateral costophrenic angle. Within this limitation, there is no appreciable pleural effusion. No evidence of  pneumothorax. No acute osseous abnormality identified. IMPRESSION: 1. Cardiomegaly with central pulmonary vascular congestion. 2. Interstitial and ill-defined airspace opacities bilaterally (with a mid and lower lung predominance), which may reflect pulmonary edema and/or pneumonia. 3. The abdominal soft tissues partially obscure the left lateral costophrenic angle. Within this limitation, there is no appreciable pleural effusion. Electronically Signed   By: Jackey Loge D.O.   On:  01/11/2023 11:37    EKG: Independently reviewed.  A-fib with RVR  Assessment/Plan Principal Problem:   CHF (congestive heart failure) (HCC) Active Problems:   Essential hypertension   A-fib (HCC)   Acute on chronic diastolic CHF (congestive heart failure) (HCC)  (please populate well all problems here in Problem List. (For example, if patient is on BP meds at home and you resume or decide to hold them, it is a problem that needs to be her. Same for CAD, COPD, HLD and so on)  Acute on chronic HFpEF decompensation -Likely secondary to uncontrolled A-fib and uncontrolled HTN -Discussed with cardiology, who recommended reloading of amiodarone.  Will give 2 doses of IV digoxin -IV Lasix 40 mg twice daily -Admitted to PCU to wean off BiPAP -Check TSH level -Check magnesium level -Increase metoprolol to 37.5 mg twice daily -Repeat chest x-ray tomorrow -Continue to hold off anticoagulation due to recent major GI bleed events.  Acute hypoxic respiratory failure -Secondary to CHF decompensation, management as above  PAF with RVR -As above.  Recently diagnosed ascending aorta hematoma versus type B dissection with intramural thrombosis -No acute concern  DVT prophylaxis: Lovenox Code Status: DNR Family Communication: Close friend [name] at bedside Disposition Plan: Patient is sick with pulmonary edema uncontrolled A-fib requiring inpatient cardiology consultation and IV diuresis, expect more than 2 midnight hospital  stay Consults called: Cardiology Admission status: PCU admit   Emeline General MD [facility] Pager [phone]  01/11/2023, 5:53 PM

## 2023-01-11 NOTE — ED Triage Notes (Signed)
Exertional shortness of breath for a month. Not on O2 at home. O2 sat dropped in the 70s. Increased swelling to left leg. On cpap with EMS with O2 at 91%. EMS gave 1 nitroglycerin SL and 5mg  albuterol en route.

## 2023-01-11 NOTE — Consult Note (Addendum)
Cardiology Consultation   Patient ID: Lauren Clark MRN: [id number]; DOB: 1955-12-30  Admit date: 01/11/2023 Date of Consult: 01/11/2023  PCP:  Ellyn Hack, MD   [facility] Providers Cardiologist:  Jodelle Red, MD   {  Patient Profile:   Lauren Clark is a 67 y.o. female with a hx of morbid obesity status post gastric sleeve resection, hypertension, hyperlipidemia, ascending aortic aneurysm, DVT, hypothyroidism, who is being seen 01/11/2023 for the evaluation of A-fib/CHF exacerbation at the request of Dr. Chipper Herb.  History of Present Illness:   Lauren Clark  was seen recently on 11/13/2022 due to complaints of chest pain.  Further evaluation revealed that she had a acute type A aortic dissection with thrombosis of false lumen versus an intramural hematoma extending from the aortic root to the aortic arch and into the descending thoracic aorta.  Ascending aortic aneurysm 5cm. During that admission was not felt that she was an operative candidate due to her comorbid risk factors and was evaluated by critical care medicine for blood pressure control.  During same admission patient developed A-fib RVR requiring multiple IV amiodarone boluses due to difficult heart rate control. Anticoagulation was held per CT surgery request.  Eventually she was discharged on oral amiodarone and with no further recommendations from EP.  Then she was seen by Dr. Laneta Simmers outpatient on 01/04/2023 for follow-up, who still recommended continued monitoring given high risk of surgical replacement.  CT during previous follow-up shows unchanged aneurysm dilatation.  Does not appear that patient ever followed up with general cardiology however, patient reports 100% compliance with medications.  Last echo during May admission showed normal LVEF of 60 to 65%.  Patient is currently admitted due to complaints of worsening shortness of breath for the past month.  Before this she was able to ambulate and had no  interference with ADLs however within the past month has gotten significantly short of breath and fatigue with minimal activity.  Today she states that approximately 1:00 this morning she began to have significant shortness of breath even at rest.  On admission patient was in normal sinus rhythm however has fluctuated in and out of this throughout the day.  In the emergency room she presented with significant respiratory distress requiring nonrebreather and then progression to BiPAP.  She was given IV Lasix 40 mg with good urinary output and improvement in symptoms.  Now on 6 L via nasal cannula.  She has also been getting a .25 mg dose of digoxin and 37.5 mg of oral metoprolol.  Patient is unaware of her atrial fibrillation and denies any palpitation, dizziness, syncope.  Denies any exertional chest pain, did note some minor discomfort when sitting up in bed today.  Chest x-ray showing cardiomegaly with central pulmonary vascular congestion, pulmonary edema/pneumonia.  Several labs are outstanding at this point.    Past Medical History:  Diagnosis Date   Anemia    s/p knee surgery   Anxiety    Aortic dissection (HCC)    Arthritis    Depression    GERD (gastroesophageal reflux disease)    Glaucoma    right eye uses drops   History of blood transfusion    [facility]   History of MRSA infection    Hyperlipidemia    Hypertension    Hypothyroidism    Impaired vision in both eyes    blind left eye, limited vision right eye   Morbid obesity (HCC)  s/p Gastric sleeve- 8'17(loss thus far approx. 100 lbs)   Peripheral vascular disease (HCC)    dvt right leg   Pre-diabetes    prior to gastric sleeve    Past Surgical History:  Procedure Laterality Date   ABDOMINAL HYSTERECTOMY  1981   with bilateral BSO   CATARACT EXTRACTION Right 2010    [facility]   COLONOSCOPY     DEBRIDEMENT AND CLOSURE WOUND Right 07/07/2014   Dr. Loralie Champagne   EYE SURGERY      bil cataraction with IOL as a child, cataract surgery late 1990- right   JOINT REPLACEMENT Right 2016   knee   KNEE DEBRIDEMENT Right 06/22/2014   Dr. Linda Hedges with wound vac   KNEE SURGERY     i&d KNEE POST REPLACEMENT   LAPAROSCOPIC GASTRIC SLEEVE RESECTION N/A 02/29/2016   Procedure: LAPAROSCOPIC GASTRIC SLEEVE RESECTION, UPPER ENDO;  Surgeon: Gaynelle Adu, MD;  Location: [facility];  Service: General;  Laterality: N/A;   TONSILLECTOMY     TOTAL HIP ARTHROPLASTY Right 06/14/2016   Procedure: RIGHT TOTAL HIP ARTHROPLASTY ANTERIOR APPROACH;  Surgeon: Durene Romans, MD;  Location: [facility];  Service: Orthopedics;  Laterality: Right;   TOTAL KNEE ARTHROPLASTY Left 09/05/2017   Procedure: LEFT TOTAL KNEE ARTHROPLASTY;  Surgeon: Durene Romans, MD;  Location: [facility];  Service: Orthopedics;  Laterality: Left;  Adductor Block   UPPER GI ENDOSCOPY  01/25/2016      Inpatient Medications: Scheduled Meds:  amiodarone  150 mg Intravenous Once   digoxin  0.125 mg Intravenous Q6H   enoxaparin (LOVENOX) injection  40 mg Subcutaneous Q24H   FLUoxetine  40 mg Oral Daily   furosemide  40 mg Intravenous Q12H   [START ON 01/12/2023] levothyroxine  88 mcg Oral Q0600   magnesium oxide  400 mg Oral BID   metoprolol tartrate  37.5 mg Oral BID   pantoprazole  40 mg Oral Daily   rosuvastatin  10 mg Oral Daily   sodium chloride flush  3 mL Intravenous Q12H   traZODone  50 mg Oral QHS   Continuous Infusions:  sodium chloride     amiodarone     Followed by   amiodarone     PRN Meds: sodium chloride, acetaminophen, ondansetron (ZOFRAN) IV, sodium chloride flush  Allergies:    Allergies  Allergen Reactions   Crestor [Rosuvastatin Calcium] Other (See Comments)    Muscle aches     Lipitor [Atorvastatin] Other (See Comments)    Muscle aches    Hctz [Hydrochlorothiazide] Other (See Comments)    Pt doesn't remember    Social History:   Social History   Socioeconomic History   Marital status:  Widowed    Spouse name: Not on file   Number of children: Not on file   Years of education: 0   Highest education level: Not on file  Occupational History   Occupation: former Public relations account executive    Comment: [employer]  Tobacco Use   Smoking status: Never   Smokeless tobacco: Never  Vaping Use   Vaping Use: Never used  Substance and Sexual Activity   Alcohol use: No   Drug use: No   Sexual activity: Not Currently    Birth control/protection: Surgical  Other Topics Concern   Not on file  Social History Narrative   epworth sleepiness scale = 2 (02/19/16)   Social Determinants of Health   Financial Resource Strain: Not on file  Food Insecurity: No Food Insecurity (11/12/2022)   Hunger Vital  Sign    Worried About Programme researcher, broadcasting/film/video in the Last Year: Never true    Ran Out of Food in the Last Year: Never true  Transportation Needs: No Transportation Needs (11/12/2022)   PRAPARE - Administrator, Civil Service (Medical): No    Lack of Transportation (Non-Medical): No  Physical Activity: Not on file  Stress: Not on file  Social Connections: Not on file  Intimate Partner Violence: Not At Risk (11/12/2022)   Humiliation, Afraid, Rape, and Kick questionnaire    Fear of Current or Ex-Partner: No    Emotionally Abused: No    Physically Abused: No    Sexually Abused: No    Family History:   Family History  Problem Relation Age of Onset   Depression Mother    Hypertension Father    Breast cancer Neg Hx      ROS:  Please see the history of present illness.  All other ROS reviewed and negative.     Physical Exam/Data:   Vitals:   01/11/23 1500 01/11/23 1503 01/11/23 1530 01/11/23 1650  BP: (!) 144/115  119/75 138/86  Pulse: 67  (!) 31 (!) 113  Resp: (!) 32  18   Temp:  98 F (36.7 C)    TempSrc:  Oral    SpO2: 96%  97%   Weight:      Height:        Intake/Output Summary (Last 24 hours) at 01/11/2023 1743 Last data filed at 01/11/2023 1402 Gross per 24 hour   Intake --  Output 1100 ml  Net -1100 ml      01/11/2023   10:52 AM 01/04/2023    2:40 PM 11/19/2022    4:35 AM  Last 3 Weights  Weight (lbs) 214 lb 15.2 oz 215 lb 216 lb 9.6 oz  Weight (kg) 97.5 kg 97.523 kg 98.249 kg     Body mass index is 41.98 kg/m.  General: On 6 L via nasal cannula, increased work of breathing HEENT: normal Neck: + JVD Vascular: No carotid bruits; Distal pulses 2+ bilaterally Cardiac: Irregularly irregular tachycardic Lungs: Crackles at the bases, diminished breath sounds Abd: soft, nontender, no hepatomegaly  Ext: no edema Musculoskeletal: 1+ edema  skin: warm and dry  Neuro:  CNs 2-12 intact, no focal abnormalities noted Psych:  Normal affect   EKG:  The EKG was personally reviewed and demonstrates:  Initial EKG showed normal sinus rhythm with heart rates in the 60s. Subsequent EKG shows atrial fibrillation, heart rate 114. Telemetry:  Telemetry was personally reviewed and demonstrates: Atrial fibrillation heart rates 120s  Relevant CV Studies: Echocardiogram 11/14/2022   1. Severely dilated aortic root; suggest CTA or MRA to further assess.   2. Left ventricular ejection fraction, by estimation, is 60 to 65%. The  left ventricle has normal function. The left ventricle has no regional  wall motion abnormalities. There is mild left ventricular hypertrophy.  Left ventricular diastolic function  could not be evaluated.   3. Right ventricular systolic function is normal. The right ventricular  size is normal.   4. The mitral valve is normal in structure. No evidence of mitral valve  regurgitation. No evidence of mitral stenosis.   5. The aortic valve was not well visualized. Aortic valve regurgitation  is trivial. No aortic stenosis is present.   6. Aortic dilatation noted. There is severe dilatation of the aortic  root, measuring 52 mm.   7. The inferior vena cava is normal in  size with greater than 50%  respiratory variability, suggesting right  atrial pressure of 3 mmHg.    Laboratory Data:  High Sensitivity Troponin:  No results for input(s): "TROPONINIHS" in the last 720 hours.   Chemistry Recent Labs  Lab 01/11/23 1053  NA 139  K 4.0  CL 107  CO2 20*  GLUCOSE 130*  BUN 14  CREATININE 0.93  CALCIUM 8.7*  GFRNONAA >60  ANIONGAP 12    Recent Labs  Lab 01/11/23 1053  PROT 6.6  ALBUMIN 3.2*  AST 22  ALT 26  ALKPHOS 102  BILITOT 0.7   Lipids No results for input(s): "CHOL", "TRIG", "HDL", "LABVLDL", "LDLCALC", "CHOLHDL" in the last 168 hours.  Hematology Recent Labs  Lab 01/11/23 1053  WBC 11.7*  RBC 4.37  HGB 13.0  HCT 41.9  MCV 95.9  MCH 29.7  MCHC 31.0  RDW 14.6  PLT 295   Thyroid No results for input(s): "TSH", "FREET4" in the last 168 hours.  BNPNo results for input(s): "BNP", "PROBNP" in the last 168 hours.  DDimer No results for input(s): "DDIMER" in the last 168 hours.   Radiology/Studies:  DG Chest Port 1 View  Result Date: 01/11/2023 CLINICAL DATA:  Provided history: Shortness of breath. Increased leg swelling. EXAM: PORTABLE CHEST 1 VIEW COMPARISON:  Prior chest radiographs 11/15/2022 and earlier. Chest CT 01/04/2023. FINDINGS: Cardiomegaly. Central pulmonary vascular congestion. Interstitial and ill-defined airspace opacities bilaterally (with a mid and lower lung predominance). The abdominal soft tissues partially obscure the left lateral costophrenic angle. Within this limitation, there is no appreciable pleural effusion. No evidence of pneumothorax. No acute osseous abnormality identified. IMPRESSION: 1. Cardiomegaly with central pulmonary vascular congestion. 2. Interstitial and ill-defined airspace opacities bilaterally (with a mid and lower lung predominance), which may reflect pulmonary edema and/or pneumonia. 3. The abdominal soft tissues partially obscure the left lateral costophrenic angle. Within this limitation, there is no appreciable pleural effusion. Electronically Signed   By:  Jackey Loge D.O.   On: 01/11/2023 11:37   DG BONE DENSITY (DXA)  Result Date: 01/09/2023 EXAM: DUAL X-RAY ABSORPTIOMETRY (DXA) FOR BONE MINERAL DENSITY IMPRESSION: Referring Physician:  Kathi Ludwig ASAD [facility] Your patient completed a bone mineral density test using GE Lunar iDXA system (analysis version: 16). Technologist:      lmn PATIENT: Name: Makahla, Kiser Patient ID: [id number] Birth Date: October 31, 1955 Height: 57.5 in. Sex: Female Measured: 01/09/2023 Weight: 213.8 lbs. Indications: Bariatric surgery, Bilateral Oophorectomy (65.51), Caucasian, Depression, Estrogen Deficient, Hypothyroid, Hysterectomy, Levothyroxine, Pantoprazole, Postmenopausal, Prozac, Right hip replacement, Secondary Osteoporosis, Topamax Fractures: None Treatments: Calcium (E943.0), Vitamin D (E933.5) ASSESSMENT: The BMD measured at Femur Neck is 0.723 g/cm2 with a T-score of -2.3. This patient is considered osteopenic/low bone mass according to World Health Organization [facility]) criteria. The quality of the exam is limited by patient body habitus. L4 was excluded due to degenerative changes. Right hip excluded due to surgical hardware. Site Region Measured Date Measured Age YA BMD Significant CHANGE T-score Left Femur Neck 01/09/2023 68.0 -2.3 0.723 g/cm2 Left Femur Neck 06/11/2020 65.4 -2.4 0.703 g/cm2 AP Spine L1-L3 01/09/2023 68.0 -0.2 1.146 g/cm2 * AP Spine L1-L3 06/11/2020 65.4 -0.8 1.077 g/cm2 Left Forearm Radius 33% 01/09/2023 68.0 -1.0 0.787 g/cm2 World Health Organization [facility]) criteria for post-menopausal, Caucasian Women: Normal       T-score at or above -1 SD Osteopenia   T-score between -1 and -2.5 SD Osteoporosis T-score at or below -2.5 SD RECOMMENDATION: 1. All patients should optimize calcium and vitamin  D intake. 2. Consider FDA-approved medical therapies in postmenopausal women and men aged 58 years and older, based on the following: a. A hip or vertebral (clinical or morphometric) fracture. b. T-score = -2.5 at the femoral neck or  spine after appropriate evaluation to exclude secondary causes. c. Low bone mass (T-score between -1.0 and -2.5 at the femoral neck or spine) and a 10-year probability of a hip fracture = 3% or a 10-year probability of a major osteoporosis-related fracture = 20% based on the [place]-adapted WHO algorithm. d. Clinician judgment and/or patient preferences may indicate treatment for people with 10-year fracture probabilities above or below these levels. FOLLOW-UP: Patients with diagnosis of osteoporosis or at high risk for fracture should have regular bone mineral density tests.? Patients eligible for Medicare are allowed routine testing every 2 years.? The testing frequency can be increased to one year for patients who have rapidly progressing disease, are receiving or discontinuing medical therapy to restore bone mass, or have additional risk factors. I have reviewed this study and agree with the findings. [facility] FRAX* 10-year Probability of Fracture Based on femoral neck BMD: Femur (Left) Major Osteoporotic Fracture: 10.9% Hip Fracture:                2.0% Population:                  [place] (Caucasian) Risk Factors:                Secondary Osteoporosis *FRAX is a Armed forces logistics/support/administrative officer of the [employer] of [employer] for Metabolic Bone Disease, a World Science writer (WHO) [employer]. ASSESSMENT: The probability of a major osteoporotic fracture is 10.9% within the next ten years. The probability of a hip fracture is 2.0% within the next ten years. Electronically Signed   By: Romona Curls M.D.   On: 01/09/2023 09:38     Assessment and Plan:   Persistent A-fib RVR Patient with recent admission in May 2024 with difficult to control rates, discharged on oral amiodarone, no follow up.  Anticoagulation was deferred due to aortic dissection versus hematoma.  Now with worsening symptoms of shortness of breath for the last month, even at rest.  Presented with decreased oxygen  saturations and requiring BiPAP.  Now on nasal cannula 6 L.  She has been diuresed on 40 mg IV Lasix with significant improvement in symptoms. Chest x-ray noting pulmonary congestion, positive JVD, positive peripheral edema.  Uncontrolled rates 120s. Start IV amiodarone Has been given PO metoprolol 37.5 mg and digoxin 0.25 mg Has orders for Lopressor 37.5 mg twice daily.  Reevaluate tomorrow whether this is still needed after starting IV amiodarone. Need to discuss with Dr. Laneta Simmers about anticoagulation. Will order echocardiogram Continue IV Lasix 40 mg TSH normal  Ascending aortic aneurysm Hypertension Deferring surgery due to high risk per Dr. Laneta Simmers.  The previously seen intramural hematoma extending along the descending thoracic aorta has resolved. Need to continue to closely monitor blood pressure parameters.  CTA from recent visit in January 31, 2023 indicated no significant changes.  Hyperlipidemia Continue rosuvastatin 10 mg  New York Heart Association (NYHA) Functional Class NYHA Class III  CHA2DS2-VASc Score = 3   This indicates a 3.2% annual risk of stroke. The patient's score is based upon: CHF History: 0 HTN History: 1 Diabetes History: 0 Stroke History: 0 Vascular Disease History: 0 Age Score: 1 Gender Score: 1      For questions or updates, please contact [facility] Please consult www.Amion.com for contact info under    Signed, Abagail Kitchens, PA-C  01/11/2023 5:43 PM

## 2023-01-11 NOTE — ED Notes (Signed)
Help get patient undressed into a gown on the monitor did EKG shown to Dr Jeraldine Loots patient is resting with call bell in reach

## 2023-01-11 NOTE — ED Notes (Signed)
Pt placed on 6L on nasal cannula per MD Jeraldine Loots.  Bi-pap taken off and put on standby.  Respiratory therapy notified and aware.  SpO2 95% at this time.

## 2023-01-12 ENCOUNTER — Other Ambulatory Visit: Payer: Self-pay

## 2023-01-12 ENCOUNTER — Inpatient Hospital Stay (HOSPITAL_COMMUNITY): Payer: 59

## 2023-01-12 ENCOUNTER — Other Ambulatory Visit (HOSPITAL_COMMUNITY): Payer: 59

## 2023-01-12 DIAGNOSIS — R0609 Other forms of dyspnea: Secondary | ICD-10-CM | POA: Diagnosis not present

## 2023-01-12 DIAGNOSIS — I509 Heart failure, unspecified: Secondary | ICD-10-CM | POA: Diagnosis not present

## 2023-01-12 DIAGNOSIS — I10 Essential (primary) hypertension: Secondary | ICD-10-CM

## 2023-01-12 DIAGNOSIS — I5033 Acute on chronic diastolic (congestive) heart failure: Secondary | ICD-10-CM | POA: Diagnosis not present

## 2023-01-12 DIAGNOSIS — I4891 Unspecified atrial fibrillation: Secondary | ICD-10-CM | POA: Diagnosis not present

## 2023-01-12 DIAGNOSIS — E039 Hypothyroidism, unspecified: Secondary | ICD-10-CM

## 2023-01-12 LAB — BASIC METABOLIC PANEL
Anion gap: 9 (ref 5–15)
BUN: 13 mg/dL (ref 8–23)
CO2: 28 mmol/L (ref 22–32)
Calcium: 8.7 mg/dL — ABNORMAL LOW (ref 8.9–10.3)
Chloride: 102 mmol/L (ref 98–111)
Creatinine, Ser: 1.1 mg/dL — ABNORMAL HIGH (ref 0.44–1.00)
GFR, Estimated: 55 mL/min — ABNORMAL LOW (ref >60)
Glucose, Bld: 108 mg/dL — ABNORMAL HIGH (ref 70–99)
Potassium: 3.6 mmol/L (ref 3.5–5.1)
Sodium: 139 mmol/L (ref 135–145)

## 2023-01-12 LAB — ECHOCARDIOGRAM COMPLETE
AR max vel: 3.47 cm2
AV Area VTI: 3.35 cm2
AV Area mean vel: 3.9 cm2
AV Mean grad: 3.3 mmHg
AV Peak grad: 5.6 mmHg
Ao pk vel: 1.18 m/s
Area-P 1/2: 5.54 cm2
Height: 60 in
S' Lateral: 2.3 cm
Weight: 3453.29 oz

## 2023-01-12 NOTE — Assessment & Plan Note (Addendum)
Echocardiogram with preserved LV systolic function with EF 60 to 65%, no LVH, RV systolic function is preserved, LA and RA with no enlargement, no significant valvular disease. Ascending aortic aneurysm 4.9 cm.   Urine output 1,600 ml  Systolic blood pressure 119 to 120 mmHg.   Continue with metoprolol 37.5 bid.  Holding RAAS inhibition and SGLT 2 inh until renal function more stable.  Continue to hold on loop diuretic.   Acute on chronic hypoxemic respiratory failure, patient has 02 desaturation on ambulation on room air down to 85%, correcting to 92% with 2 L/min per [place]. Will need supplemental home 02 at the time of her discharge.

## 2023-01-12 NOTE — Progress Notes (Signed)
  Progress Note   Patient: Lauren Clark WJX:[id number] DOB: 04-22-1956 DOA: 01/11/2023     1 DOS: the patient was seen and examined on 01/12/2023   Brief hospital course: Lauren Clark was admitted to the hospital with the working diagnosis of heart failure exacerbation.   67 yo female with the past medical history of hypothyroidism, hypertension, paroxysmal atrial fibrillation, ascending aortic aneurysm with dissection and intramural thrombosis vs aorta  hematoma, who presented with dyspnea and palpitations. Reported worsening dyspnea over the last 3 to 4 weeks, along with bilateral lowe extremity edema. On her initial physical examination her blood pressure was 144/115, 119/75, HR 67, RR 32 and 02 saturation 96%, lungs with rales bilaterally with increased work of breathing, heart with S1 and S2 present, regular, abdomen with no distention and positive lower extremity edema.   Chest radiograph with cardiomegaly, bilateral hilar vascular congestion, bilateral symmetrical interstitial infiltrates, more predominant at the lower lobes.   EKG 59 bpm, normal axis, normal intervals, sinus rhythm with poor R R wave progression, no significant ST segment changes, negative T wave lead V1 and V2.   Assessment and Plan: * Acute on chronic diastolic CHF (congestive heart failure) (HCC) Echocardiogram with preserved LV systolic function 60 to 65%, mild LVH, RV systolic function preserved, normal size atriums, trivial pericardial effusion, no significant valvular disease, severe dilatation of the aortic root, measuring 52 mm.   Urine output 2000 ml  Systolic blood pressure 129 to 108 mmHg.   Plan to continue diuresis with IV furosemide 40 mg IV bid.  Continue with metoprolol 37.5 bid.   Atrial fibrillation with rapid ventricular response (HCC) Patient has been placed on IV amiodarone for rate control with good toleration.  No anticoagulation due to aortic dissection vs wall hematoma.   Essential  hypertension Continue metoprolol for blood pressure control.   Hyperlipidemia Continue rosuvastatin.   Aortic dissection (HCC) Conservative management.  Follow up with CT surgery. Per echocardiogram no aortic valvular compromise, follow up new echocardiogram.   Class 3 obesity (HCC) Calculated BMI 42.1   Hypothyroidism Continue with levothyroxine.         Subjective: Patient is feeling better but not back to baseline, continue to have lower extremity edema, but dyspnea has improved.   Physical Exam: Vitals:   01/12/23 0028 01/12/23 0350 01/12/23 0728 01/12/23 1122  BP: 116/83 127/83 (!) 129/92 108/76  Pulse: 67 87 80 76  Resp: 20 20 19 16   Temp: 97.7 F (36.5 C) 98.1 F (36.7 C) 97.7 F (36.5 C) 97.6 F (36.4 C)  TempSrc: Oral Oral Oral Oral  SpO2: 93% 96% 97% 93%  Weight: 97.9 kg     Height:       Neurology awake and alert ENT with mild pallor Cardiovascular with S1 and S2 present, irregularly irregular with no gallops, rubs or murmurs Mild JVD Positive lower extremity edema +  Respiratory with no rales or wheezing, no rhonchi. Abdomen with no distention  Data Reviewed:    Family Communication: no family at the bedside   Disposition: Status is: Inpatient Remains inpatient appropriate because: IV furosemide and IV amiodarone   Planned Discharge Destination: Home     Author: Coralie Keens, MD 01/12/2023 1:39 PM  For on call review [email or web address].

## 2023-01-12 NOTE — Assessment & Plan Note (Signed)
Continue with levothyroxine  

## 2023-01-12 NOTE — Assessment & Plan Note (Signed)
Continue metoprolol for blood pressure control 

## 2023-01-12 NOTE — Hospital Course (Addendum)
Lauren Clark was admitted to the hospital with the working diagnosis of heart failure exacerbation.   67 yo female with the past medical history of hypothyroidism, hypertension, paroxysmal atrial fibrillation, ascending aortic aneurysm with dissection and intramural thrombosis vs aorta  hematoma, who presented with dyspnea and palpitations. Reported worsening dyspnea over the last 3 to 4 weeks, along with bilateral lowe extremity edema. On her initial physical examination her blood pressure was 144/115, 119/75, HR 67, RR 32 and 02 saturation 96%, lungs with rales bilaterally with increased work of breathing, heart with S1 and S2 present, regular, abdomen with no distention and positive lower extremity edema.   Chest radiograph with cardiomegaly, bilateral hilar vascular congestion, bilateral symmetrical interstitial infiltrates, more predominant at the lower lobes.   EKG 59 bpm, normal axis, normal intervals, sinus rhythm with poor R R wave progression, no significant ST segment changes, negative T wave lead V1 and V2.   Telemetry patient with atrial fibrillation. Volume status has improved.   07/12 possible discharge tomorrow if renal function more stable.  07/13 renal function still not stable today.

## 2023-01-12 NOTE — Progress Notes (Signed)
DAILY PROGRESS NOTE   Patient Name: Lauren Clark Date of Encounter: 01/12/2023 Cardiologist: Jodelle Red, MD  Chief Complaint   Breathing better  Patient Profile   Lauren Clark is a 67 y.o. female with a hx of morbid obesity status post gastric sleeve resection, hypertension, hyperlipidemia, ascending aortic aneurysm, DVT, hypothyroidism, who is being seen 01/11/2023 for the evaluation of A-fib/CHF exacerbation at the request of Dr. Chipper Herb.   Subjective   No overnight events -recorded net negative 1.8L. Small increase in creatinine to 1.10. BNP was 890. Echo pending. On IV amiodarone - HR improved in the 90's. BP improved today.  Objective   Vitals:   01/11/23 2040 01/12/23 0028 01/12/23 0350 01/12/23 0728  BP: (!) 128/92 116/83 127/83 (!) 129/92  Pulse: (!) 50 67 87 80  Resp: (!) 22 20 20 19   Temp: 97.9 F (36.6 C) 97.7 F (36.5 C) 98.1 F (36.7 C) 97.7 F (36.5 C)  TempSrc: Oral Oral Oral Oral  SpO2: 94% 93% 96% 97%  Weight: 95.7 kg 97.9 kg    Height: 5' (1.524 m)       Intake/Output Summary (Last 24 hours) at 01/12/2023 0921 Last data filed at 01/12/2023 0602 Gross per 24 hour  Intake 200 ml  Output 2000 ml  Net -1800 ml   Filed Weights   01/11/23 1052 01/11/23 2040 01/12/23 0028  Weight: 97.5 kg 95.7 kg 97.9 kg    Physical Exam   General appearance: alert and no distress Neck: JVD - 5 cm above sternal notch, no carotid bruit, and thyroid not enlarged, symmetric, no tenderness/mass/nodules Lungs: diminished breath sounds bibasilar and rales RLL Heart: irregularly irregular rhythm Abdomen: soft, non-tender; bowel sounds normal; no masses,  no organomegaly and obese Extremities: edema 1+ sockline Pulses: 2+ and symmetric Skin: pale, warm, dry Neurologic: Grossly normal Psych: Flat affect  Inpatient Medications    Scheduled Meds:  enoxaparin (LOVENOX) injection  40 mg Subcutaneous Q24H   FLUoxetine  40 mg Oral Daily   furosemide  40 mg  Intravenous Q12H   latanoprost  1 drop Right Eye QHS   levothyroxine  88 mcg Oral Q0600   magnesium oxide  400 mg Oral BID   metoprolol tartrate  37.5 mg Oral BID   pantoprazole  40 mg Oral Daily   rosuvastatin  10 mg Oral Daily   sodium chloride flush  3 mL Intravenous Q12H   traZODone  50 mg Oral QHS    Continuous Infusions:  sodium chloride     amiodarone 30 mg/hr (01/12/23 0711)    PRN Meds: sodium chloride, acetaminophen, ondansetron (ZOFRAN) IV, sodium chloride flush   Labs   Results for orders placed or performed during the hospital encounter of 01/11/23 (from the past 48 hour(s))  Comprehensive metabolic panel     Status: Abnormal   Collection Time: 01/11/23 10:53 AM  Result Value Ref Range   Sodium 139 135 - 145 mmol/L   Potassium 4.0 3.5 - 5.1 mmol/L   Chloride 107 98 - 111 mmol/L   CO2 20 (L) 22 - 32 mmol/L   Glucose, Bld 130 (H) 70 - 99 mg/dL    Comment: Glucose reference range applies only to samples taken after fasting for at least 8 hours.   BUN 14 8 - 23 mg/dL   Creatinine, Ser 0.98 0.44 - 1.00 mg/dL   Calcium 8.7 (L) 8.9 - 10.3 mg/dL   Total Protein 6.6 6.5 - 8.1 g/dL   Albumin 3.2 (L)  3.5 - 5.0 g/dL   AST 22 15 - 41 U/L   ALT 26 0 - 44 U/L   Alkaline Phosphatase 102 38 - 126 U/L   Total Bilirubin 0.7 0.3 - 1.2 mg/dL   GFR, Estimated >16 >10 mL/min    Comment: (NOTE) Calculated using the CKD-EPI Creatinine Equation (2021)    Anion gap 12 5 - 15    Comment: Performed at [facility], [address], [place], [place] [address]  CBC with Differential/Platelet     Status: Abnormal   Collection Time: 01/11/23 10:53 AM  Result Value Ref Range   WBC 11.7 (H) 4.0 - 10.5 K/uL   RBC 4.37 3.87 - 5.11 MIL/uL   Hemoglobin 13.0 12.0 - 15.0 g/dL   HCT 40.9 81.1 - 91.4 %   MCV 95.9 80.0 - 100.0 fL   MCH 29.7 26.0 - 34.0 pg   MCHC 31.0 30.0 - 36.0 g/dL   RDW 78.2 95.6 - 21.3 %   Platelets 295 150 - 400 K/uL   nRBC 0.2 0.0 - 0.2 %   Neutrophils Relative  % 81 %   Neutro Abs 9.6 (H) 1.7 - 7.7 K/uL   Lymphocytes Relative 8 %   Lymphs Abs 0.9 0.7 - 4.0 K/uL   Monocytes Relative 8 %   Monocytes Absolute 0.9 0.1 - 1.0 K/uL   Eosinophils Relative 1 %   Eosinophils Absolute 0.1 0.0 - 0.5 K/uL   Basophils Relative 1 %   Basophils Absolute 0.1 0.0 - 0.1 K/uL   Immature Granulocytes 1 %   Abs Immature Granulocytes 0.07 0.00 - 0.07 K/uL    Comment: Performed at [facility], [address] [place], [place] [address]  Magnesium     Status: None   Collection Time: 01/11/23 10:53 AM  Result Value Ref Range   Magnesium 2.0 1.7 - 2.4 mg/dL    Comment: Performed at [facility], [address], [place], [place] [address]  Phosphorus     Status: None   Collection Time: 01/11/23 10:53 AM  Result Value Ref Range   Phosphorus 3.0 2.5 - 4.6 mg/dL    Comment: Performed at [facility], [address] [place], [place] [address]  TSH     Status: Abnormal   Collection Time: 01/11/23 10:53 AM  Result Value Ref Range   TSH 7.045 (H) 0.350 - 4.500 uIU/mL    Comment: Performed by a 3rd Generation assay with a functional sensitivity of <=0.01 uIU/mL. Performed at [facility], [address] [place], [place] [address]   Resp panel by RT-PCR (RSV, Flu A&B, Covid) Anterior Nasal Swab     Status: None   Collection Time: 01/11/23 10:54 AM   Specimen: Anterior Nasal Swab  Result Value Ref Range   SARS Coronavirus 2 by RT PCR NEGATIVE NEGATIVE   Influenza A by PCR NEGATIVE NEGATIVE   Influenza B by PCR NEGATIVE NEGATIVE    Comment: (NOTE) The Xpert Xpress SARS-CoV-2/FLU/RSV plus assay is intended as an aid in the diagnosis of influenza from Nasopharyngeal swab specimens and should not be used as a sole basis for treatment. Nasal washings and aspirates are unacceptable for Xpert Xpress SARS-CoV-2/FLU/RSV testing.  Fact Sheet for Patients: BloggerCourse.com  Fact Sheet for Healthcare  Providers: SeriousBroker.it  This test is not yet approved or cleared by the [place] FDA and has been authorized for detection and/or diagnosis of SARS-CoV-2 by FDA under an Emergency Use  Authorization (EUA). This EUA will remain in effect (meaning this test can be used) for the duration of the COVID-19 declaration under Section 564(b)(1) of the Act, 21 U.S.C. section 360bbb-3(b)(1), unless the authorization is terminated or revoked.     Resp Syncytial Virus by PCR NEGATIVE NEGATIVE    Comment: (NOTE) Fact Sheet for Patients: BloggerCourse.com  Fact Sheet for Healthcare Providers: SeriousBroker.it  This test is not yet approved or cleared by the [place] FDA and has been authorized for detection and/or diagnosis of SARS-CoV-2 by FDA under an Emergency Use Authorization (EUA). This EUA will remain in effect (meaning this test can be used) for the duration of the COVID-19 declaration under Section 564(b)(1) of the Act, 21 U.S.C. section 360bbb-3(b)(1), unless the authorization is terminated or revoked.  Performed at [facility], [address], [place], [place] [address]   Brain natriuretic peptide     Status: Abnormal   Collection Time: 01/11/23 10:54 AM  Result Value Ref Range   B Natriuretic Peptide 890.7 (H) 0.0 - 100.0 pg/mL    Comment: Performed at [facility], [address], [place], [place] [address]  Basic metabolic panel     Status: Abnormal   Collection Time: 01/12/23  2:24 AM  Result Value Ref Range   Sodium 139 135 - 145 mmol/L   Potassium 3.6 3.5 - 5.1 mmol/L   Chloride 102 98 - 111 mmol/L   CO2 28 22 - 32 mmol/L   Glucose, Bld 108 (H) 70 - 99 mg/dL    Comment: Glucose reference range applies only to samples taken after fasting for at least 8 hours.   BUN 13 8 - 23 mg/dL   Creatinine, Ser 8.29 (H) 0.44 - 1.00 mg/dL   Calcium 8.7 (L) 8.9 - 10.3 mg/dL   GFR,  Estimated 55 (L) >60 mL/min    Comment: (NOTE) Calculated using the CKD-EPI Creatinine Equation (2021)    Anion gap 9 5 - 15    Comment: Performed at [facility], [address] [place], [place] [address]    ECG   N/A  Telemetry   Afib with CVR - Personally Reviewed  Radiology    DG Chest 1 View  Result Date: 01/12/2023 CLINICAL DATA:  CHF EXAM: CHEST  1 VIEW COMPARISON:  CXR 01/11/23. FINDINGS: Cardiomegaly. Possible trace left pleural effusion. Increased left basilar opacity, which could represent progressive atelectasis or infection. Radiographically apparent displaced rib fractures. Visualized upper abdomen is unremarkable IMPRESSION: 1. Increased left basilar opacity, which could represent progressive atelectasis or infection. 2. Possible trace left pleural effusion. Electronically Signed   By: Lorenza Cambridge M.D.   On: 01/12/2023 08:38   DG Chest Port 1 View  Result Date: 01/11/2023 CLINICAL DATA:  Provided history: Shortness of breath. Increased leg swelling. EXAM: PORTABLE CHEST 1 VIEW COMPARISON:  Prior chest radiographs 11/15/2022 and earlier. Chest CT 01/04/2023. FINDINGS: Cardiomegaly. Central pulmonary vascular congestion. Interstitial and ill-defined airspace opacities bilaterally (with a mid and lower lung predominance). The abdominal soft tissues partially obscure the left lateral costophrenic angle. Within this limitation, there is no appreciable pleural effusion. No evidence of pneumothorax. No acute osseous abnormality identified. IMPRESSION: 1. Cardiomegaly with central pulmonary vascular congestion. 2. Interstitial and ill-defined airspace opacities bilaterally (with a mid and lower lung predominance), which may reflect pulmonary edema and/or pneumonia. 3. The abdominal soft tissues partially obscure the left lateral costophrenic angle. Within this limitation, there is no appreciable pleural effusion. Electronically Signed  By: Jackey Loge D.O.   On: 01/11/2023  11:37    Cardiac Studies   Echo pending  Assessment   Principal Problem:   CHF (congestive heart failure) (HCC) Active Problems:   Essential hypertension   Atrial fibrillation with rapid ventricular response (HCC)   Acute on chronic diastolic CHF (congestive heart failure) (HCC)   Plan   Good diuresis yesterday - small increase in creatinine. Would continue IV lasix 40 mg BID today - continue IV amiodarone for another 24 hours and then can switch back to oral. Plan for echo today. Titrate GDMT for heart failure - would likely benefit from aldactone and SGLT2 inhibitor as bp allows.  Time Spent Directly with Patient:  I have spent a total of 25 minutes with the patient reviewing hospital notes, telemetry, EKGs, labs and examining the patient as well as establishing an assessment and plan that was discussed personally with the patient.  > 50% of time was spent in direct patient care.  Length of Stay:  LOS: 1 day   Chrystie Nose, MD, [facility], FACP  [place]  [facility]  Medical Director of the Advanced Lipid Disorders &  Cardiovascular Risk Reduction Clinic Diplomate of the American Board of Clinical Lipidology Attending Cardiologist  Direct Dial: [phone]  Fax: [phone]  Website:  [email or web address] Jesenya [email or web address] 01/12/2023, 9:21 AM

## 2023-01-12 NOTE — Progress Notes (Signed)
Pt Currently in ECHO Lab

## 2023-01-12 NOTE — Plan of Care (Signed)

## 2023-01-12 NOTE — Assessment & Plan Note (Signed)
Continue rosuvastatin.  

## 2023-01-12 NOTE — Assessment & Plan Note (Addendum)
Ascending aorta and aortic root aneurysm  Conservative management.  Follow up with CT surgery as outpatient.

## 2023-01-12 NOTE — Progress Notes (Signed)
*  PRELIMINARY RESULTS* Echocardiogram 2D Echocardiogram has been performed.  Lauren Clark 01/12/2023, 5:44 PM

## 2023-01-12 NOTE — Assessment & Plan Note (Addendum)
Telemetry with atrial fibrillation with rate 80 bpm. Plan to continue rate control with metoprolol and amiodarone. Now placed on anticoagulation with apixaban.

## 2023-01-12 NOTE — Progress Notes (Signed)
Attempted echo @ 1:02pm; pt was in chair and just started eating. Will try again.

## 2023-01-12 NOTE — TOC Initial Note (Signed)
Transition of Care [facility]) - Initial/Assessment Note    Patient Details  Name: Lauren Clark MRN: [id number] Date of Birth: 31-May-1956  [facility]) CM/SW Contact:    Lauren Haven, RN Phone Number: 01/12/2023, 3:29 PM  Clinical Narrative:                 From home with her cat, pta self ambulatory with walker, but now gets really tired.   she has PCP, Dr.  Velta Clark, she has insurance on file, she has walker and a cane at home.  She states her SIL or friend Lauren Clark)  will transport her home at dc.  Her friend is more of her support sytem.  She gets her medications from [place] on [address] and [address]. NCM offered choice for HH if she needs HH she states she does not have a preference.    Expected Discharge Plan: Home w Home Health Services Barriers to Discharge: Continued Medical Work up   Patient Goals and CMS Choice Patient states their goals for this hospitalization and ongoing recovery are:: return home   Choice offered to / list presented to : NA      Expected Discharge Plan and Services In-house Referral: NA Discharge Planning Services: CM Consult Post Acute Care Choice: NA Living arrangements for the past 2 months: Apartment                 DME Arranged: N/A DME Agency: NA       HH Arranged: NA          Prior Living Arrangements/Services Living arrangements for the past 2 months: Apartment Lives with:: Self Patient language and need for interpreter reviewed:: Yes Do you feel safe going back to the place where you live?: Yes      Need for Family Participation in Patient Care: Yes (Comment) Care giver support system in place?: Yes (comment) Current home services: DME (walker, cane) Criminal Activity/Legal Involvement Pertinent to Current Situation/Hospitalization: No - Comment as needed  Activities of Daily Living Home Assistive Devices/Equipment: Walker (specify type) ADL Screening (condition at time of  admission) Patient's cognitive ability adequate to safely complete daily activities?: Yes Is the patient deaf or have difficulty hearing?: No Does the patient have difficulty seeing, even when wearing glasses/contacts?: No Does the patient have difficulty concentrating, remembering, or making decisions?: No Patient able to express need for assistance with ADLs?: Yes Does the patient have difficulty dressing or bathing?: No Independently performs ADLs?: Yes (appropriate for developmental age) Does the patient have difficulty walking or climbing stairs?: No Weakness of Legs: None Weakness of Arms/Hands: None  Permission Sought/Granted Permission sought to share information with : Case Manager Permission granted to share information with : Yes, Verbal Permission Granted              Emotional Assessment Appearance:: Appears stated age Attitude/Demeanor/Rapport: Engaged Affect (typically observed): Appropriate Orientation: : Oriented to Self, Oriented to Place, Oriented to  Time, Oriented to Situation Alcohol / Substance Use: Not Applicable Psych Involvement: No (comment)  Admission diagnosis:  CHF (congestive heart failure) (HCC) [I50.9] Respiratory distress [R06.03] Patient Active Problem List   Diagnosis Date Noted   Hypothyroidism 01/12/2023   Acute on chronic diastolic CHF (congestive heart failure) (HCC) 01/11/2023   CHF (congestive heart failure) (HCC) 01/11/2023   Atrial fibrillation with rapid ventricular response (HCC) 11/18/2022   Intramural aortic hematoma (HCC) 11/18/2022   Aortic dissection (HCC) 11/12/2022   Positive hepatitis C  antibody test 09/02/2019   S/P left TKA 09/05/2017   S/P total knee replacement 09/05/2017   S/P right THA, AA 06/14/2016   Prediabetes 02/29/2016   Dyslipidemia 02/29/2016   Degenerative joint disease of right hip 02/29/2016   S/P laparoscopic sleeve gastrectomy 02/29/2016   Preoperative cardiovascular examination 02/19/2016    Hyperlipidemia 02/19/2016   Class 3 obesity (HCC) 02/19/2016   Essential hypertension 02/19/2016   PCP:  Lauren Hack, MD Pharmacy:   [facility] [id number] [place], [place] - [address] AT [facility] [address] [place] [address] Phone: [phone] Fax: [phone]     Social Determinants of Health (SDOH) Social History: SDOH Screenings   Food Insecurity: No Food Insecurity (01/11/2023)  Housing: Low Risk  (01/11/2023)  Transportation Needs: No Transportation Needs (01/11/2023)  Utilities: Not At Risk (01/11/2023)  Depression (PHQ2-9): Low Risk  (09/02/2019)  Tobacco Use: Low Risk  (01/11/2023)   SDOH Interventions:     Readmission Risk Interventions     No data to display

## 2023-01-12 NOTE — Assessment & Plan Note (Signed)
Calculated BMI 42.1

## 2023-01-12 NOTE — Plan of Care (Signed)
  Problem: Safety: Goal: Ability to remain free from injury will improve 01/12/2023 0447 by Colon Flattery, RN Outcome: Progressing 01/12/2023 0037 by Colon Flattery, RN Outcome: Progressing   Problem: Education: Goal: Knowledge of General Education information will improve Description: Including pain rating scale, medication(s)/side effects and non-pharmacologic comfort measures 01/12/2023 0447 by Colon Flattery, RN Outcome: Progressing 01/12/2023 0037 by Colon Flattery, RN Outcome: Progressing

## 2023-01-13 ENCOUNTER — Other Ambulatory Visit (HOSPITAL_COMMUNITY): Payer: Self-pay

## 2023-01-13 DIAGNOSIS — I4891 Unspecified atrial fibrillation: Secondary | ICD-10-CM | POA: Diagnosis not present

## 2023-01-13 DIAGNOSIS — I10 Essential (primary) hypertension: Secondary | ICD-10-CM | POA: Diagnosis not present

## 2023-01-13 DIAGNOSIS — N179 Acute kidney failure, unspecified: Secondary | ICD-10-CM

## 2023-01-13 DIAGNOSIS — I5033 Acute on chronic diastolic (congestive) heart failure: Secondary | ICD-10-CM | POA: Diagnosis not present

## 2023-01-13 DIAGNOSIS — I7100 Dissection of unspecified site of aorta: Secondary | ICD-10-CM

## 2023-01-13 LAB — BASIC METABOLIC PANEL
Anion gap: 9 (ref 5–15)
BUN: 22 mg/dL (ref 8–23)
CO2: 30 mmol/L (ref 22–32)
Calcium: 8.4 mg/dL — ABNORMAL LOW (ref 8.9–10.3)
Chloride: 99 mmol/L (ref 98–111)
Creatinine, Ser: 1.26 mg/dL — ABNORMAL HIGH (ref 0.44–1.00)
GFR, Estimated: 47 mL/min — ABNORMAL LOW (ref >60)
Glucose, Bld: 107 mg/dL — ABNORMAL HIGH (ref 70–99)
Potassium: 3.2 mmol/L — ABNORMAL LOW (ref 3.5–5.1)
Sodium: 138 mmol/L (ref 135–145)

## 2023-01-13 LAB — MAGNESIUM: Magnesium: 1.8 mg/dL (ref 1.7–2.4)

## 2023-01-13 MED ORDER — APIXABAN 5 MG PO TABS
5.0000 mg | ORAL_TABLET | Freq: Two times a day (BID) | ORAL | Status: DC
  Administered 2023-01-13 – 2023-01-15 (×5): 5 mg via ORAL
  Filled 2023-01-13 (×5): qty 1

## 2023-01-13 MED ORDER — POTASSIUM CHLORIDE CRYS ER 20 MEQ PO TBCR
40.0000 meq | EXTENDED_RELEASE_TABLET | ORAL | Status: AC
  Administered 2023-01-13 (×2): 40 meq via ORAL
  Filled 2023-01-13 (×2): qty 2

## 2023-01-13 MED ORDER — MAGNESIUM SULFATE 2 GM/50ML IV SOLN
2.0000 g | Freq: Once | INTRAVENOUS | Status: AC
  Administered 2023-01-13: 2 g via INTRAVENOUS
  Filled 2023-01-13: qty 50

## 2023-01-13 MED ORDER — AMIODARONE HCL 200 MG PO TABS
200.0000 mg | ORAL_TABLET | Freq: Two times a day (BID) | ORAL | Status: DC
  Administered 2023-01-13 – 2023-01-15 (×5): 200 mg via ORAL
  Filled 2023-01-13 (×5): qty 1

## 2023-01-13 NOTE — TOC Benefit Eligibility Note (Signed)
Pharmacy Patient Advocate Encounter  Insurance verification completed.    The patient is insured through [employer] Medicare Part D  Ran test claim for Eliquis 5 mg and the current 30 day co-pay is $0.00.  Ran test claim for Xarelto 20 mg and the current 30 day co-pay is $0.00.  This test claim was processed through [facility]- copay amounts may vary at other pharmacies due to pharmacy/plan contracts, or as the patient moves through the different stages of their insurance plan.    Sharlyne Koeneman, CPHT Pharmacy Patient Advocate Specialist [facility] Patient Advocate Team Direct Number: [phone]  Fax: [phone] 

## 2023-01-13 NOTE — Progress Notes (Signed)
DAILY PROGRESS NOTE   Patient Name: Lauren Clark Date of Encounter: 01/13/2023 Cardiologist: Jodelle Red, MD  Chief Complaint   No complaints  Patient Profile   Lauren Clark is a 67 y.o. female with a hx of morbid obesity status post gastric sleeve resection, hypertension, hyperlipidemia, ascending aortic aneurysm, DVT, hypothyroidism, who is being seen 01/11/2023 for the evaluation of A-fib/CHF exacerbation at the request of Dr. Chipper Herb.   Subjective   Repeat echo yesterday shows LVEF 60-65% - no significant change compared to prior study. Net negative another 1.2L.  Creatinine is rising - now 1.26. Potassium 3.2 today.  Objective   Vitals:   01/13/23 0001 01/13/23 0002 01/13/23 0454 01/13/23 0455  BP:  124/77  108/80  Pulse: (!) 129 78    Resp: 16 20 15    Temp:  98 F (36.7 C)  98 F (36.7 C)  TempSrc:  Oral  Oral  SpO2: 97%     Weight:    98 kg  Height:        Intake/Output Summary (Last 24 hours) at 01/13/2023 0807 Last data filed at 01/13/2023 0804 Gross per 24 hour  Intake --  Output 2350 ml  Net -2350 ml   Filed Weights   01/11/23 2040 01/12/23 0028 01/13/23 0455  Weight: 95.7 kg 97.9 kg 98 kg    Physical Exam   General appearance: alert and no distress Neck: JVD - 5 cm above sternal notch, no carotid bruit, and thyroid not enlarged, symmetric, no tenderness/mass/nodules Lungs: diminished breath sounds bibasilar and rales RLL Heart: irregularly irregular rhythm Abdomen: soft, non-tender; bowel sounds normal; no masses,  no organomegaly and obese Extremities: edema 1+ sockline Pulses: 2+ and symmetric Skin: pale, warm, dry Neurologic: Grossly normal Psych: Flat affect  Inpatient Medications    Scheduled Meds:  enoxaparin (LOVENOX) injection  40 mg Subcutaneous Q24H   FLUoxetine  40 mg Oral Daily   furosemide  40 mg Intravenous Q12H   latanoprost  1 drop Right Eye QHS   levothyroxine  88 mcg Oral Q0600   magnesium oxide  400 mg Oral  BID   metoprolol tartrate  37.5 mg Oral BID   pantoprazole  40 mg Oral Daily   rosuvastatin  10 mg Oral Daily   sodium chloride flush  3 mL Intravenous Q12H   traZODone  50 mg Oral QHS    Continuous Infusions:  sodium chloride     amiodarone 30 mg/hr (01/13/23 0803)    PRN Meds: sodium chloride, acetaminophen, ondansetron (ZOFRAN) IV, sodium chloride flush   Labs   Results for orders placed or performed during the hospital encounter of 01/11/23 (from the past 48 hour(s))  Comprehensive metabolic panel     Status: Abnormal   Collection Time: 01/11/23 10:53 AM  Result Value Ref Range   Sodium 139 135 - 145 mmol/L   Potassium 4.0 3.5 - 5.1 mmol/L   Chloride 107 98 - 111 mmol/L   CO2 20 (L) 22 - 32 mmol/L   Glucose, Bld 130 (H) 70 - 99 mg/dL    Comment: Glucose reference range applies only to samples taken after fasting for at least 8 hours.   BUN 14 8 - 23 mg/dL   Creatinine, Ser 4.09 0.44 - 1.00 mg/dL   Calcium 8.7 (L) 8.9 - 10.3 mg/dL   Total Protein 6.6 6.5 - 8.1 g/dL   Albumin 3.2 (L) 3.5 - 5.0 g/dL   AST 22 15 - 41 U/L   ALT  26 0 - 44 U/L   Alkaline Phosphatase 102 38 - 126 U/L   Total Bilirubin 0.7 0.3 - 1.2 mg/dL   GFR, Estimated >82 >95 mL/min    Comment: (NOTE) Calculated using the CKD-EPI Creatinine Equation (2021)    Anion gap 12 5 - 15    Comment: Performed at [facility], [address], [place], [place] [address]  CBC with Differential/Platelet     Status: Abnormal   Collection Time: 01/11/23 10:53 AM  Result Value Ref Range   WBC 11.7 (H) 4.0 - 10.5 K/uL   RBC 4.37 3.87 - 5.11 MIL/uL   Hemoglobin 13.0 12.0 - 15.0 g/dL   HCT 86.5 78.4 - 69.6 %   MCV 95.9 80.0 - 100.0 fL   MCH 29.7 26.0 - 34.0 pg   MCHC 31.0 30.0 - 36.0 g/dL   RDW 29.5 28.4 - 13.2 %   Platelets 295 150 - 400 K/uL   nRBC 0.2 0.0 - 0.2 %   Neutrophils Relative % 81 %   Neutro Abs 9.6 (H) 1.7 - 7.7 K/uL   Lymphocytes Relative 8 %   Lymphs Abs 0.9 0.7 - 4.0 K/uL   Monocytes  Relative 8 %   Monocytes Absolute 0.9 0.1 - 1.0 K/uL   Eosinophils Relative 1 %   Eosinophils Absolute 0.1 0.0 - 0.5 K/uL   Basophils Relative 1 %   Basophils Absolute 0.1 0.0 - 0.1 K/uL   Immature Granulocytes 1 %   Abs Immature Granulocytes 0.07 0.00 - 0.07 K/uL    Comment: Performed at [facility], [address], [place], [place] [address]  Magnesium     Status: None   Collection Time: 01/11/23 10:53 AM  Result Value Ref Range   Magnesium 2.0 1.7 - 2.4 mg/dL    Comment: Performed at [facility], [address] [place], [place] [address]  Phosphorus     Status: None   Collection Time: 01/11/23 10:53 AM  Result Value Ref Range   Phosphorus 3.0 2.5 - 4.6 mg/dL    Comment: Performed at [facility], [address] [place], [place] [address]  TSH     Status: Abnormal   Collection Time: 01/11/23 10:53 AM  Result Value Ref Range   TSH 7.045 (H) 0.350 - 4.500 uIU/mL    Comment: Performed by a 3rd Generation assay with a functional sensitivity of <=0.01 uIU/mL. Performed at [facility], [address], [place], [place] [address]   Resp panel by RT-PCR (RSV, Flu A&B, Covid) Anterior Nasal Swab     Status: None   Collection Time: 01/11/23 10:54 AM   Specimen: Anterior Nasal Swab  Result Value Ref Range   SARS Coronavirus 2 by RT PCR NEGATIVE NEGATIVE   Influenza A by PCR NEGATIVE NEGATIVE   Influenza B by PCR NEGATIVE NEGATIVE    Comment: (NOTE) The Xpert Xpress SARS-CoV-2/FLU/RSV plus assay is intended as an aid in the diagnosis of influenza from Nasopharyngeal swab specimens and should not be used as a sole basis for treatment. Nasal washings and aspirates are unacceptable for Xpert Xpress SARS-CoV-2/FLU/RSV testing.  Fact Sheet for Patients: BloggerCourse.com  Fact Sheet for Healthcare Providers: SeriousBroker.it  This test is not yet approved or cleared by the [place] FDA and has been  authorized for detection and/or diagnosis of SARS-CoV-2 by FDA under an Emergency Use Authorization (EUA). This EUA will remain in effect (meaning this test can be used) for  the duration of the COVID-19 declaration under Section 564(b)(1) of the Act, 21 U.S.C. section 360bbb-3(b)(1), unless the authorization is terminated or revoked.     Resp Syncytial Virus by PCR NEGATIVE NEGATIVE    Comment: (NOTE) Fact Sheet for Patients: BloggerCourse.com  Fact Sheet for Healthcare Providers: SeriousBroker.it  This test is not yet approved or cleared by the [place] FDA and has been authorized for detection and/or diagnosis of SARS-CoV-2 by FDA under an Emergency Use Authorization (EUA). This EUA will remain in effect (meaning this test can be used) for the duration of the COVID-19 declaration under Section 564(b)(1) of the Act, 21 U.S.C. section 360bbb-3(b)(1), unless the authorization is terminated or revoked.  Performed at [facility], [address], [place], [place] [address]   Brain natriuretic peptide     Status: Abnormal   Collection Time: 01/11/23 10:54 AM  Result Value Ref Range   B Natriuretic Peptide 890.7 (H) 0.0 - 100.0 pg/mL    Comment: Performed at [facility], [address], [place], [place] [address]  Basic metabolic panel     Status: Abnormal   Collection Time: 01/12/23  2:24 AM  Result Value Ref Range   Sodium 139 135 - 145 mmol/L   Potassium 3.6 3.5 - 5.1 mmol/L   Chloride 102 98 - 111 mmol/L   CO2 28 22 - 32 mmol/L   Glucose, Bld 108 (H) 70 - 99 mg/dL    Comment: Glucose reference range applies only to samples taken after fasting for at least 8 hours.   BUN 13 8 - 23 mg/dL   Creatinine, Ser 8.29 (H) 0.44 - 1.00 mg/dL   Calcium 8.7 (L) 8.9 - 10.3 mg/dL   GFR, Estimated 55 (L) >60 mL/min    Comment: (NOTE) Calculated using the CKD-EPI Creatinine Equation (2021)    Anion gap 9 5 - 15     Comment: Performed at [facility], [address], [place], [place] [address]  Basic metabolic panel     Status: Abnormal   Collection Time: 01/13/23 12:49 AM  Result Value Ref Range   Sodium 138 135 - 145 mmol/L   Potassium 3.2 (L) 3.5 - 5.1 mmol/L   Chloride 99 98 - 111 mmol/L   CO2 30 22 - 32 mmol/L   Glucose, Bld 107 (H) 70 - 99 mg/dL    Comment: Glucose reference range applies only to samples taken after fasting for at least 8 hours.   BUN 22 8 - 23 mg/dL   Creatinine, Ser 0.86 (H) 0.44 - 1.00 mg/dL   Calcium 8.4 (L) 8.9 - 10.3 mg/dL   GFR, Estimated 47 (L) >60 mL/min    Comment: (NOTE) Calculated using the CKD-EPI Creatinine Equation (2021)    Anion gap 9 5 - 15    Comment: Performed at [facility], [address], [place], [place] [address]  Magnesium     Status: None   Collection Time: 01/13/23 12:49 AM  Result Value Ref Range   Magnesium 1.8 1.7 - 2.4 mg/dL    Comment: Performed at [facility], [address] [place], [place] [address]    ECG   N/A  Telemetry   Afib with CVR - Personally Reviewed  Radiology    ECHOCARDIOGRAM COMPLETE  Result Date: 01/12/2023    ECHOCARDIOGRAM REPORT   Patient Name:   KALYNA MICHELS Date of Exam: 01/12/2023 Medical Rec #:  [id number]    Height:  60.0 in Accession #:    [id number]   Weight:       215.8 lb Date of Birth:  02-Feb-1956    BSA:          1.928 m Patient Age:    67 years     BP:           113/76 mmHg Patient Gender: F            HR:           83 bpm. Exam Location:  Inpatient Procedure: 2D Echo, Cardiac Doppler and Color Doppler Indications:    R06.9 DOE  History:        Patient has prior history of Echocardiogram examinations, most                 recent 11/14/2022. CHF, Signs/Symptoms:Shortness of Breath and                 Dyspnea; Risk Factors:Hypertension, Dyslipidemia and Non-Smoker.  Sonographer:    Dondra Prader RVT RCS Referring Phys: [id number] SHENG L HALEY  Sonographer Comments: Suboptimal  parasternal window. Image acquisition challenging due to respiratory motion and Image acquisition challenging due to patient body habitus. IMPRESSIONS  1. The aortic valve is tricuspid. There is mild calcification of the aortic valve. There is mild thickening of the aortic valve. Aortic valve regurgitation is mild. Aortic valve sclerosis/calcification is present, without any evidence of aortic stenosis.  2. Left ventricular ejection fraction, by estimation, is 60 to 65%. The left ventricle has normal function. The left ventricle has no regional wall motion abnormalities. Left ventricular diastolic parameters are indeterminate.  3. The mitral valve is normal in structure. Trivial mitral valve regurgitation. No evidence of mitral stenosis.  4. Right ventricular systolic function is normal. The right ventricular size is normal.  5. The ascending aorta and aortic root are aneurysmal and each measure 4.9cm. Findings consistent with CTA.  6. The inferior vena cava is normal in size with greater than 50% respiratory variability, suggesting right atrial pressure of 3 mmHg. Comparison(s): No significant change from prior study. FINDINGS  Left Ventricle: Left ventricular ejection fraction, by estimation, is 60 to 65%. The left ventricle has normal function. The left ventricle has no regional wall motion abnormalities. The left ventricular internal cavity size was normal in size. There is  no left ventricular hypertrophy. Left ventricular diastolic parameters are indeterminate. Right Ventricle: The right ventricular size is normal. No increase in right ventricular wall thickness. Right ventricular systolic function is normal. Left Atrium: Left atrial size was normal in size. Right Atrium: Right atrial size was normal in size. Pericardium: There is no evidence of pericardial effusion. Mitral Valve: The mitral valve is normal in structure. Trivial mitral valve regurgitation. No evidence of mitral valve stenosis. Tricuspid Valve:  The tricuspid valve is normal in structure. Tricuspid valve regurgitation is trivial. Aortic Valve: The aortic valve is tricuspid. There is mild calcification of the aortic valve. There is mild thickening of the aortic valve. Aortic valve regurgitation is mild. Aortic valve sclerosis/calcification is present, without any evidence of aortic stenosis. Aortic valve mean gradient measures 3.3 mmHg. Aortic valve peak gradient measures 5.6 mmHg. Aortic valve area, by VTI measures 3.35 cm. Pulmonic Valve: The pulmonic valve was normal in structure. Pulmonic valve regurgitation is trivial. Aorta: The aortic root and ascending aorta are aneurysmal and each measure 4.9cm. Venous: The inferior vena cava is normal in size with greater than 50% respiratory variability, suggesting right atrial  pressure of 3 mmHg. IAS/Shunts: The atrial septum is grossly normal.  LEFT VENTRICLE PLAX 2D LVIDd:         4.10 cm   Diastology LVIDs:         2.30 cm   LV e' medial:    7.62 cm/s LV PW:         1.40 cm   LV E/e' medial:  16.3 LV IVS:        1.30 cm   LV e' lateral:   12.70 cm/s LVOT diam:     1.90 cm   LV E/e' lateral: 9.8 LV SV:         71 LV SV Index:   37 LVOT Area:     2.84 cm  RIGHT VENTRICLE             IVC RV S prime:     10.90 cm/s  IVC diam: 2.00 cm TAPSE (M-mode): 1.4 cm LEFT ATRIUM             Index        RIGHT ATRIUM           Index LA diam:        3.80 cm 1.97 cm/m   RA Area:     14.50 cm LA Vol (A2C):   53.5 ml 27.75 ml/m  RA Volume:   38.40 ml  19.92 ml/m LA Vol (A4C):   53.3 ml 27.64 ml/m LA Biplane Vol: 53.9 ml 27.96 ml/m  AORTIC VALVE                     PULMONIC VALVE AV Area (Vmax):    3.47 cm      PV Vmax:       0.95 m/s AV Area (Vmean):   3.90 cm      PV Peak grad:  3.6 mmHg AV Area (VTI):     3.35 cm AV Vmax:           118.43 cm/s AV Vmean:          79.267 cm/s AV VTI:            0.210 m AV Peak Grad:      5.6 mmHg AV Mean Grad:      3.3 mmHg LVOT Vmax:         145.00 cm/s LVOT Vmean:        109.000 cm/s  LVOT VTI:          0.249 m LVOT/AV VTI ratio: 1.18  AORTA Ao Root diam: 4.90 cm Ao Asc diam:  4.90 cm MITRAL VALVE MV Area (PHT): 5.54 cm     SHUNTS MV Decel Time: 137 msec     Systemic VTI:  0.25 m MV E velocity: 124.00 cm/s  Systemic Diam: 1.90 cm MV A velocity: 26.20 cm/s MV E/A ratio:  4.73 Laurance Flatten MD Electronically signed by Laurance Flatten MD Signature Date/Time: 01/12/2023/7:03:36 PM    Final    DG Chest 1 View  Result Date: 01/12/2023 CLINICAL DATA:  CHF EXAM: CHEST  1 VIEW COMPARISON:  CXR 01/11/23. FINDINGS: Cardiomegaly. Possible trace left pleural effusion. Increased left basilar opacity, which could represent progressive atelectasis or infection. Radiographically apparent displaced rib fractures. Visualized upper abdomen is unremarkable IMPRESSION: 1. Increased left basilar opacity, which could represent progressive atelectasis or infection. 2. Possible trace left pleural effusion. Electronically Signed   By: Lorenza Cambridge M.D.   On: 01/12/2023 08:38   DG Chest [facility]  1 View  Result Date: 01/11/2023 CLINICAL DATA:  Provided history: Shortness of breath. Increased leg swelling. EXAM: PORTABLE CHEST 1 VIEW COMPARISON:  Prior chest radiographs 11/15/2022 and earlier. Chest CT 01/04/2023. FINDINGS: Cardiomegaly. Central pulmonary vascular congestion. Interstitial and ill-defined airspace opacities bilaterally (with a mid and lower lung predominance). The abdominal soft tissues partially obscure the left lateral costophrenic angle. Within this limitation, there is no appreciable pleural effusion. No evidence of pneumothorax. No acute osseous abnormality identified. IMPRESSION: 1. Cardiomegaly with central pulmonary vascular congestion. 2. Interstitial and ill-defined airspace opacities bilaterally (with a mid and lower lung predominance), which may reflect pulmonary edema and/or pneumonia. 3. The abdominal soft tissues partially obscure the left lateral costophrenic angle. Within this  limitation, there is no appreciable pleural effusion. Electronically Signed   By: Jackey Loge D.O.   On: 01/11/2023 11:37    Cardiac Studies   See echo above  Assessment   Principal Problem:   Acute on chronic diastolic CHF (congestive heart failure) (HCC) Active Problems:   Hyperlipidemia   Class 3 obesity (HCC)   Essential hypertension   Aortic dissection (HCC)   Atrial fibrillation with rapid ventricular response (HCC)   Hypothyroidism   Plan   Probably at end diuresis - creatinine rising. Stop further lasix. Potassium 3.2 today - given 80 MEQ. Will switch IV amiodarone to oral. Echo is reassuring. Remains in rate-controlled afib. Would need to be anticoagulated if we are to consider cardioversion. She did have recent aortic dissection with intramural hematoma, however, recent CT scan showed a resolved hematoma per Dr. Sharee Pimple note. I think it would be ok to start on anticoagulation. Recommend Eliquis 5 mg BID. Would recommend outpatient follow-up to consider elective cardioversion or TEE/cardioversion in the near future.  Time Spent Directly with Patient:  I have spent a total of 25 minutes with the patient reviewing hospital notes, telemetry, EKGs, labs and examining the patient as well as establishing an assessment and plan that was discussed personally with the patient.  > 50% of time was spent in direct patient care.  Length of Stay:  LOS: 2 days   Chrystie Nose, MD, [facility], FACP  [place]  [facility]  Medical Director of the Advanced Lipid Disorders &  Cardiovascular Risk Reduction Clinic Diplomate of the American Board of Clinical Lipidology Attending Cardiologist  Direct Dial: [phone]  Fax: [phone]  Website:  [email or web address] Nicely Doristine Shehan 01/13/2023, 8:07 AM

## 2023-01-13 NOTE — Plan of Care (Signed)

## 2023-01-13 NOTE — Discharge Instructions (Signed)

## 2023-01-13 NOTE — Progress Notes (Signed)
Progress Note   Patient: Lauren Clark ZOX:[id number] DOB: 09-Jul-1955 DOA: 01/11/2023     2 DOS: the patient was seen and examined on 01/13/2023   Brief hospital course: Mrs. Filipiak was admitted to the hospital with the working diagnosis of heart failure exacerbation.   67 yo female with the past medical history of hypothyroidism, hypertension, paroxysmal atrial fibrillation, ascending aortic aneurysm with dissection and intramural thrombosis vs aorta  hematoma, who presented with dyspnea and palpitations. Reported worsening dyspnea over the last 3 to 4 weeks, along with bilateral lowe extremity edema. On her initial physical examination her blood pressure was 144/115, 119/75, HR 67, RR 32 and 02 saturation 96%, lungs with rales bilaterally with increased work of breathing, heart with S1 and S2 present, regular, abdomen with no distention and positive lower extremity edema.   Chest radiograph with cardiomegaly, bilateral hilar vascular congestion, bilateral symmetrical interstitial infiltrates, more predominant at the lower lobes.   EKG 59 bpm, normal axis, normal intervals, sinus rhythm with poor R R wave progression, no significant ST segment changes, negative T wave lead V1 and V2.   Telemetry patient with atrial fibrillation. Volume status has improved.   07/12 possible discharge tomorrow if renal function more stable.   Assessment and Plan: * Acute on chronic diastolic CHF (congestive heart failure) (HCC) Echocardiogram with preserved LV systolic function with EF 60 to 65%, no LVH, RV systolic function is preserved, LA and RA with no enlargement, no significant valvular disease. Ascending aortic aneurysm 4.9 cm.   Urine output 1,450 ml  Systolic blood pressure 109 to 108 mmHg.   Continue with metoprolol 37.5 bid.  Holding RAAS inhibition and SGLT 2 inh until renal function more stable.   Atrial fibrillation with rapid ventricular response (HCC) Telemetry with atrial fibrillation with  rate 80 bpm. Plan to continue rate control with metoprolol and amiodarone. Now placed on anticoagulation with apixaban.   AKI (acute kidney injury) (HCC) Hypokalemia.   Renal function with serum cr at 1,26 with K at 3,2 and serum bicarbonate at 30. Na 138. Mg 1,8  Plan to po 40 meq Kcl x 2 and IV 2 g mag sulfate. Follow up renal function and electrolytes in am.  Hold on furosemide for now.  Essential hypertension Continue metoprolol for blood pressure control.   Hyperlipidemia Continue rosuvastatin.   Aortic dissection (HCC) Ascending aorta and aortic root aneurysm  Conservative management.  Follow up with CT surgery as outpatient.   Hypothyroidism Continue with levothyroxine.   Class 3 obesity (HCC) Calculated BMI 42.1         Subjective: Patient is feeling better, dyspnea and edema continue to improve.   Physical Exam: Vitals:   01/13/23 0454 01/13/23 0455 01/13/23 0822 01/13/23 1130  BP:  108/80 (!) 108/91 109/85  Pulse:   65 78  Resp: 15  18 16   Temp:  98 F (36.7 C) 98 F (36.7 C) 98 F (36.7 C)  TempSrc:  Oral Oral Oral  SpO2:   94% 95%  Weight:  98 kg    Height:       Neurology awake and alert  ENT with no pallor Cardiovascular with S1 and S2 present, irregularly irregular with no gallops, rubs or murmurs No JVD Non pitting ankle edema Respiratory with no wheezing, rales or rhonchi Abdomen with no distention  Data Reviewed:    Family Communication: no family at the bedside   Disposition: Status is: Inpatient Remains inpatient appropriate because: pending improvement in renal function  Planned Discharge Destination: Home    Author: Coralie Keens, MD 01/13/2023 12:39 PM  For on call review [email or web address].

## 2023-01-13 NOTE — Progress Notes (Signed)
Heart Failure Navigator Progress Note  Assessed for Heart & Vascular TOC clinic readiness.  Patient does not meet criteria due to EF 60-65%, patient with a [facility] scheduled appointment on 02/03/2023 @ 10:05 am. .   Navigator will sign off at this time.   Rhae Hammock, BSN, Scientist, clinical (histocompatibility and immunogenetics) Only

## 2023-01-13 NOTE — Assessment & Plan Note (Addendum)
Hypokalemia.   Renal function with serum cr at 1,26 with K at 3,2 and serum bicarbonate at 30. Na 138. Mg 1,8  Plan to po 40 meq Kcl x 2 and IV 2 g mag sulfate. Follow up renal function and electrolytes in am.  Hold on furosemide for now.

## 2023-01-13 NOTE — Care Management Important Message (Signed)
Important Message  Patient Details  Name: Lauren Clark MRN: [id number] Date of Birth: 1956-04-13   Medicare Important Message Given:  Yes     Renie Ora 01/13/2023, 12:22 PM

## 2023-01-13 NOTE — Plan of Care (Signed)
Pt progressing well still on amiodarone drip

## 2023-01-14 DIAGNOSIS — I5033 Acute on chronic diastolic (congestive) heart failure: Secondary | ICD-10-CM | POA: Diagnosis not present

## 2023-01-14 DIAGNOSIS — N179 Acute kidney failure, unspecified: Secondary | ICD-10-CM | POA: Diagnosis not present

## 2023-01-14 DIAGNOSIS — I4891 Unspecified atrial fibrillation: Secondary | ICD-10-CM | POA: Diagnosis not present

## 2023-01-14 DIAGNOSIS — I10 Essential (primary) hypertension: Secondary | ICD-10-CM | POA: Diagnosis not present

## 2023-01-14 LAB — BASIC METABOLIC PANEL
Anion gap: 9 (ref 5–15)
BUN: 31 mg/dL — ABNORMAL HIGH (ref 8–23)
CO2: 28 mmol/L (ref 22–32)
Calcium: 8.6 mg/dL — ABNORMAL LOW (ref 8.9–10.3)
Chloride: 99 mmol/L (ref 98–111)
Creatinine, Ser: 1.34 mg/dL — ABNORMAL HIGH (ref 0.44–1.00)
GFR, Estimated: 43 mL/min — ABNORMAL LOW (ref >60)
Glucose, Bld: 101 mg/dL — ABNORMAL HIGH (ref 70–99)
Potassium: 4.2 mmol/L (ref 3.5–5.1)
Sodium: 136 mmol/L (ref 135–145)

## 2023-01-14 NOTE — TOC Progression Note (Signed)
Transition of Care [facility] - Progression Note    Patient Details  Name: Lauren Clark MRN: [id number] Date of Birth: 1956/06/01  Transition of Care [facility]) CM/SW Contact  Ronny Bacon, RN Phone Number: 01/14/2023, 10:27 AM  Clinical Narrative:   Spoke with patient by phone, no preference for [facility] agency. HH PT arranged through [facility]. Home O2 arranged through [facility].    Expected Discharge Plan: Home w Home Health Services Barriers to Discharge: Continued Medical Work up  Expected Discharge Plan and Services In-house Referral: NA Discharge Planning Services: CM Consult Post Acute Care Choice: NA Living arrangements for the past 2 months: Apartment                 DME Arranged: N/A DME Agency: NA       HH Arranged: NA           Social Determinants of Health (SDOH) Interventions SDOH Screenings   Food Insecurity: No Food Insecurity (01/11/2023)  Housing: Low Risk  (01/11/2023)  Transportation Needs: No Transportation Needs (01/11/2023)  Utilities: Not At Risk (01/11/2023)  Depression (PHQ2-9): Low Risk  (09/02/2019)  Tobacco Use: Low Risk  (01/11/2023)    Readmission Risk Interventions     No data to display

## 2023-01-14 NOTE — Progress Notes (Signed)
Progress Note   Patient: Lauren Clark DOB: Dec 26, 1955 DOA: 01/11/2023     3 DOS: the patient was seen and examined on 01/14/2023   Brief hospital course: Lauren Clark was admitted to the hospital with the working diagnosis of heart failure exacerbation.   67 yo female with the past medical history of hypothyroidism, hypertension, paroxysmal atrial fibrillation, ascending aortic aneurysm with dissection and intramural thrombosis vs aorta  hematoma, who presented with dyspnea and palpitations. Reported worsening dyspnea over the last 3 to 4 weeks, along with bilateral lowe extremity edema. On her initial physical examination her blood pressure was 144/115, 119/75, HR 67, RR 32 and 02 saturation 96%, lungs with rales bilaterally with increased work of breathing, heart with S1 and S2 present, regular, abdomen with no distention and positive lower extremity edema.   Chest radiograph with cardiomegaly, bilateral hilar vascular congestion, bilateral symmetrical interstitial infiltrates, more predominant at the lower lobes.   EKG 59 bpm, normal axis, normal intervals, sinus rhythm with poor R R wave progression, no significant ST segment changes, negative T wave lead V1 and V2.   Telemetry patient with atrial fibrillation. Volume status has improved.   07/12 possible discharge tomorrow if renal function more stable.  07/13 renal function still not stable today.   Assessment and Plan: * Acute on chronic diastolic CHF (congestive heart failure) (HCC) Echocardiogram with preserved LV systolic function with EF 60 to 65%, no LVH, RV systolic function is preserved, LA and RA with no enlargement, no significant valvular disease. Ascending aortic aneurysm 4.9 cm.   Urine output 1,600 ml  Systolic blood pressure 119 to 120 mmHg.   Continue with metoprolol 37.5 bid.  Holding RAAS inhibition and SGLT 2 inh until renal function more stable.  Continue to hold on loop diuretic.   Acute on chronic  hypoxemic respiratory failure, patient has 02 desaturation on ambulation on room air down to 85%, correcting to 92% with 2 L/min per [facility]. Will need supplemental home 02 at the time of her discharge.   Atrial fibrillation with rapid ventricular response (HCC) Telemetry with atrial fibrillation with rate 80 bpm. Plan to continue rate control with metoprolol and amiodarone. Anticoagulation with apixaban.   AKI (acute kidney injury) (HCC) Hypokalemia.   Renal function today with serum cr at 1,34 with K at 4,2 and serum bicarbonate at 28. Na 136  Her volume status has improved.   Plan to follow up renal function in am.  Continue holding diuretic therapy.   Essential hypertension Continue metoprolol for blood pressure control.   Hyperlipidemia Continue rosuvastatin.   Aortic dissection (HCC) Ascending aorta and aortic root aneurysm  Conservative management.  Follow up with CT surgery as outpatient.   Hypothyroidism Continue with levothyroxine.   Class 3 obesity (HCC) Calculated BMI 42.1         Subjective: Patient with no chest pain, dyspnea and edema have improved.   Physical Exam: Vitals:   01/14/23 0010 01/14/23 0500 01/14/23 0845 01/14/23 0859  BP: (!) 111/55 120/60 (!) 119/48   Pulse: (!) 50 (!) 53 (!) 57 (!) 54  Resp: 18 16 20 19   Temp: 98 F (36.7 C) 97.7 F (36.5 C) 97.6 F (36.4 C)   TempSrc: Oral Oral Oral   SpO2: 95% 96% 93% 94%  Weight:  93.4 kg    Height:       Neurology awake and alert ENT with mild pallor Cardiovascular with S1 and S2 present, irregularly irregular with no gallops, rubs  or murmurs No JVD Trace non pitting lower extremity edema Respiratory with no rales or wheezing, no rhonchi Abdomen with no distention  Data Reviewed:    Family Communication: no family at the bedside   Disposition: Status is: Inpatient Remains inpatient appropriate because: pending renal function to improve.   Planned Discharge Destination:  Home     Author: Coralie Keens, MD 01/14/2023 4:08 PM  For on call review [email or web address].

## 2023-01-14 NOTE — Progress Notes (Signed)
   01/14/23 0900  PT Recommendation  Follow Up Recommendations Home health PT  Assistance recommended at discharge PRN  Patient can return home with the following Assist for transportation;Assistance with cooking/housework  Functional Status Assessment Patient has had a recent decline in their functional status and demonstrates the ability to make significant improvements in function in a reasonable and predictable amount of time.  PT equipment Other (comment) (home O2, carrier for rollator for O2 tank, pulse oximeter)   Full note to follow as evaluation completed.  Equipment and f/u needs above.  Felma Pfefferle M,PT Acute Rehab Services [phone]

## 2023-01-14 NOTE — Progress Notes (Signed)
SATURATION QUALIFICATIONS: (This note is used to comply with regulatory documentation for home oxygen)  Patient Saturations on Room Air at Rest = 90%  Patient Saturations on Room Air while Ambulating = 85%  Patient Saturations on 2 Liters of oxygen while Ambulating = 94%  Please briefly explain why patient needs home oxygen:Pt needs 2LO2 with activity to maintain sats >88%.   Alvie Speltz M,PT Acute Rehab Services [phone]

## 2023-01-14 NOTE — Progress Notes (Addendum)
Occupational Therapy Evaluation Patient Details Name: Lauren Clark MRN: [id number] DOB: Sep 23, 1955 Today's Date: 01/14/2023   History of Present Illness Pt is a 67 yo female admitted with CHF exacerbation.  Past medical history of hypothyroidism, hypertension, paroxysmal atrial fibrillation, ascending aortic aneurysm with dissection and intramural thrombosis vs aorta  hematoma.   Clinical Impression   Pt currently at modified independent for simulated selfcare sit to stand and toilet transfers using the RW for support.  Oxygen sats at 90-92% on room air at rest decreasing to 88% with transfer to and from the toilet.  HR stable as well as BP throughout in sitting.  Pt lives alone and has all needed DME.  No further acute OT needs at this time.       Recommendations for follow up therapy are one component of a multi-disciplinary discharge planning process, led by the attending physician.  Recommendations may be updated based on patient status, additional functional criteria and insurance authorization.   Assistance Recommended at Discharge None     Functional Status Assessment  Patient has not had a recent decline in their functional status  Equipment Recommendations  None recommended by OT       Precautions / Restrictions Precautions Precautions: Fall Restrictions Weight Bearing Restrictions: No      Mobility Bed Mobility               General bed mobility comments: Pt sitting up in recliner to start session    Transfers Overall transfer level: Modified independent Equipment used: Rolling walker (2 wheels)               General transfer comment: Pt with no difficulty using RW for transfer to and from the bathroom including toilet transfer.      Balance Overall balance assessment: Mild deficits observed, not formally tested                                         ADL either performed or assessed with clinical judgement   ADL Overall ADL's :  Modified independent                                       General ADL Comments: Pt modified independent for simulated selfcare, simulated shower/tub transfers, and toilet transfers using the RW for support.  Pt has rollator she has been using at home currently but prior to getting sicker, she did not use an assistive device.  Discussed energy conservation strategies for meal prep and bathing tasks.  Pt uses a shower seat currently.  Oxygen sats decreased to 88% on room air with activity, 90-91% at rest.  BP at 120/50 in sitting.     Vision Baseline Vision/History: 1 Wears glasses (blind in the left eye, eye disease in the right) Ability to See in Adequate Light: 2 Moderately impaired Patient Visual Report: No change from baseline Additional Comments: Vision not formally assessed.  Pt reports baseline visual acuity deficits secondary to left eye blindness and eye diseased on the right.  Reports using magnifier at home for small print but able to read the clock and see the TV in the room this session.     Perception  Not tested   Praxis  Not tested    Pertinent Vitals/Pain Pain Assessment Pain Assessment: No/denies pain  Hand Dominance Right   Extremity/Trunk Assessment Upper Extremity Assessment Upper Extremity Assessment: Overall WFL for tasks assessed   Lower Extremity Assessment Lower Extremity Assessment: Defer to PT evaluation   Cervical / Trunk Assessment Cervical / Trunk Assessment: Normal   Communication Communication Communication: No difficulties   Cognition Arousal/Alertness: Awake/alert Behavior During Therapy: WFL for tasks assessed/performed Overall Cognitive Status: Within Functional Limits for tasks assessed                                       General Comments  60 bpm, 96% 2LO2, 112/58; post BP 119/58, 65 bpm, 85% on RA wiht activity, 94% wiht 2L with activity            Home Living Family/patient expects to be  discharged to:: Private residence Living Arrangements: Alone   Type of Home: Apartment Architect) Home Access: Level entry     Home Layout: One level     Bathroom Shower/Tub: Chief Strategy Officer: Handicapped height     Home Equipment: Rollator (4 wheels);Cane - single point;Shower seat;Grab bars - toilet;Grab bars - tub/shower;Hand held shower head          Prior Functioning/Environment Prior Level of Function : Driving;Independent/Modified Independent             Mobility Comments: Wasnt using device PTA ADLs Comments: B/D independent         AM-PAC OT "6 Clicks" Daily Activity     Outcome Measure Help from another person eating meals?: None Help from another person taking care of personal grooming?: None Help from another person toileting, which includes using toliet, bedpan, or urinal?: A Little Help from another person bathing (including washing, rinsing, drying)?: A Little Help from another person to put on and taking off regular upper body clothing?: None Help from another person to put on and taking off regular lower body clothing?: A Little 6 Click Score: 21   End of Session Equipment Utilized During Treatment: Rolling walker (2 wheels) Nurse Communication: Mobility status  Activity Tolerance: Patient tolerated treatment well Patient left: in chair;with call bell/phone within reach                   Time: 1037-1100 OT Time Calculation (min): 23 min Charges:  OT General Charges $OT Visit: 1 Visit OT Evaluation $OT Eval Low Complexity: 1 Low OT Treatments $Self Care/Home Management : 8-22 mins  Perrin Maltese, OTR/L Acute Rehabilitation Services  Office [phone] 01/14/2023

## 2023-01-14 NOTE — Plan of Care (Signed)
  Problem: Health Behavior/Discharge Planning: Goal: Ability to manage health-related needs will improve Outcome: Completed/Met   Problem: Clinical Measurements: Goal: Ability to maintain clinical measurements within normal limits will improve Outcome: Completed/Met Goal: Diagnostic test results will improve Outcome: Completed/Met Goal: Respiratory complications will improve Outcome: Completed/Met   

## 2023-01-14 NOTE — Evaluation (Signed)
Physical Therapy Evaluation Patient Details Name: Lauren Clark MRN: [id number] DOB: Dec 27, 1955 Today's Date: 01/14/2023  History of Present Illness  Pt is a 67 yo female admitted with CHF exacerbation.  Past medical history of hypothyroidism, hypertension, paroxysmal atrial fibrillation, ascending aortic aneurysm with dissection and intramural thrombosis vs aorta  hematoma.  Clinical Impression      Pt admitted with above diagnosis. Pt was able to ambulate well with device and needed assist for steadying without device. Pt can use rollator at home. Requiring O2 (see O2 note).  Also would benefit from HHPT f/u.  Will follow acutely.  Pt currently with functional limitations due to the deficits listed below (see PT Problem List). Pt will benefit from acute skilled PT to increase their independence and safety with mobility to allow discharge.       Assistance Recommended at Discharge PRN  If plan is discharge home, recommend the following:  Can travel by private vehicle  Assist for transportation;Assistance with cooking/housework        Equipment Recommendations Other (comment) (home O2, carrier for rollator for O2 tank, pulse oximeter)  Recommendations for Other Services       Functional Status Assessment Patient has had a recent decline in their functional status and demonstrates the ability to make significant improvements in function in a reasonable and predictable amount of time.     Precautions / Restrictions Precautions Precautions: Fall Restrictions Weight Bearing Restrictions: No      Mobility  Bed Mobility Overal bed mobility: Independent                  Transfers Overall transfer level: Modified independent Equipment used: Rolling walker (2 wheels)               General transfer comment: Pt with no difficulty for sit to stand and using RW    Ambulation/Gait Ambulation/Gait assistance: Supervision Gait Distance (Feet): 85 Feet Assistive device:  Rolling walker (2 wheels), None Gait Pattern/deviations: Step-through pattern, Decreased stride length, Wide base of support, Trunk flexed   Gait velocity interpretation: <1.31 ft/sec, indicative of household ambulator   General Gait Details: Pt was able to ambulate well with RW and will need to use the RW at home.  Pt struggles to maintain balance without device at present.  Pt desaturates on RA (see O2 note).  Stairs            Wheelchair Mobility     Tilt Bed    Modified Rankin (Stroke Patients Only)       Balance Overall balance assessment: Needs assistance Sitting-balance support: No upper extremity supported, Feet supported Sitting balance-Leahy Scale: Good     Standing balance support: Bilateral upper extremity supported, During functional activity, No upper extremity supported Standing balance-Leahy Scale: Poor Standing balance comment: Needs UE support for dynamic balance                             Pertinent Vitals/Pain Pain Assessment Pain Assessment: No/denies pain    Home Living Family/patient expects to be discharged to:: Private residence Living Arrangements: Alone   Type of Home: Apartment Architect) Home Access: Level entry       Home Layout: One level Home Equipment: Rollator (4 wheels);Cane - single point;Shower seat;Grab bars - toilet;Grab bars - tub/shower;Hand held shower head      Prior Function Prior Level of Function : Driving;Independent/Modified Independent  Mobility Comments: Wasnt using device PTA ADLs Comments: B/D independent     Hand Dominance   Dominant Hand: Right    Extremity/Trunk Assessment   Upper Extremity Assessment Upper Extremity Assessment: Defer to OT evaluation    Lower Extremity Assessment Lower Extremity Assessment: Overall WFL for tasks assessed    Cervical / Trunk Assessment Cervical / Trunk Assessment: Normal  Communication   Communication: No difficulties   Cognition Arousal/Alertness: Awake/alert Behavior During Therapy: WFL for tasks assessed/performed Overall Cognitive Status: Within Functional Limits for tasks assessed                                          General Comments General comments (skin integrity, edema, etc.): 60 bpm, 96% 2LO2, 112/58; post BP 119/58, 65 bpm, 85% on RA wiht activity, 94% wiht 2L with activity    Exercises     Assessment/Plan    PT Assessment Patient needs continued PT services  PT Problem List Decreased activity tolerance;Decreased balance;Decreased mobility;Decreased knowledge of use of DME;Decreased safety awareness;Decreased knowledge of precautions;Cardiopulmonary status limiting activity       PT Treatment Interventions DME instruction;Gait training;Functional mobility training;Therapeutic activities;Therapeutic exercise;Balance training;Patient/family education    PT Goals (Current goals can be found in the Care Plan section)  Acute Rehab PT Goals Patient Stated Goal: to go home PT Goal Formulation: With patient Time For Goal Achievement: 01/28/23 Potential to Achieve Goals: Good    Frequency Min 1X/week     Co-evaluation               AM-PAC PT "6 Clicks" Mobility  Outcome Measure Help needed turning from your back to your side while in a flat bed without using bedrails?: None Help needed moving from lying on your back to sitting on the side of a flat bed without using bedrails?: None Help needed moving to and from a bed to a chair (including a wheelchair)?: None Help needed standing up from a chair using your arms (e.g., wheelchair or bedside chair)?: A Little Help needed to walk in hospital room?: A Little Help needed climbing 3-5 steps with a railing? : Total 6 Click Score: 19    End of Session Equipment Utilized During Treatment: Gait belt;Oxygen Activity Tolerance: Patient limited by fatigue Patient left: in chair;with call bell/phone within reach;with  chair alarm set Nurse Communication: Mobility status PT Visit Diagnosis: Unsteadiness on feet (R26.81);Muscle weakness (generalized) (M62.81)    Time: 1610-9604 PT Time Calculation (min) (ACUTE ONLY): 24 min   Charges:   PT Evaluation $PT Eval Moderate Complexity: 1 Mod PT Treatments $Gait Training: 8-22 mins PT General Charges $$ ACUTE PT VISIT: 1 Visit         Sabine Tenenbaum M,PT Acute Rehab Services [phone]   Bevelyn Buckles 01/14/2023, 11:18 AM

## 2023-01-15 ENCOUNTER — Inpatient Hospital Stay (HOSPITAL_COMMUNITY): Payer: 59

## 2023-01-15 DIAGNOSIS — J9691 Respiratory failure, unspecified with hypoxia: Secondary | ICD-10-CM | POA: Diagnosis not present

## 2023-01-15 DIAGNOSIS — I10 Essential (primary) hypertension: Secondary | ICD-10-CM | POA: Diagnosis not present

## 2023-01-15 DIAGNOSIS — I7121 Aneurysm of the ascending aorta, without rupture: Secondary | ICD-10-CM

## 2023-01-15 DIAGNOSIS — I7100 Dissection of unspecified site of aorta: Secondary | ICD-10-CM | POA: Diagnosis not present

## 2023-01-15 DIAGNOSIS — I5033 Acute on chronic diastolic (congestive) heart failure: Secondary | ICD-10-CM | POA: Diagnosis not present

## 2023-01-15 DIAGNOSIS — E785 Hyperlipidemia, unspecified: Secondary | ICD-10-CM

## 2023-01-15 DIAGNOSIS — I4891 Unspecified atrial fibrillation: Secondary | ICD-10-CM | POA: Diagnosis not present

## 2023-01-15 LAB — BASIC METABOLIC PANEL
Anion gap: 8 (ref 5–15)
BUN: 27 mg/dL — ABNORMAL HIGH (ref 8–23)
CO2: 29 mmol/L (ref 22–32)
Calcium: 8.6 mg/dL — ABNORMAL LOW (ref 8.9–10.3)
Chloride: 102 mmol/L (ref 98–111)
Creatinine, Ser: 1.26 mg/dL — ABNORMAL HIGH (ref 0.44–1.00)
GFR, Estimated: 47 mL/min — ABNORMAL LOW (ref >60)
Glucose, Bld: 114 mg/dL — ABNORMAL HIGH (ref 70–99)
Potassium: 4 mmol/L (ref 3.5–5.1)
Sodium: 139 mmol/L (ref 135–145)

## 2023-01-15 MED ORDER — FUROSEMIDE 20 MG PO TABS: 40.0000 mg | ORAL_TABLET | Freq: Every day | ORAL | 1 refills | Status: DC | PRN

## 2023-01-15 MED ORDER — AMIODARONE HCL 200 MG PO TABS: ORAL_TABLET | ORAL | 2 refills | Status: DC

## 2023-01-15 MED ORDER — APIXABAN 5 MG PO TABS: 5.0000 mg | ORAL_TABLET | Freq: Two times a day (BID) | ORAL | 1 refills | Status: DC

## 2023-01-15 NOTE — Progress Notes (Signed)
Approximately 1700-- Pt discharged to home. At time of discharge, pt A&O x 4 and VSS. AVS discharge instructions provided to pt with all questions answered at this time. Teach-back technique utilized. Pt verbalized understanding of picking up prescriptions at [facility] on [facility] [facility] [address] [facility], as it is a [place] and her preferred pharmacy is currently closed. Pt discharged with all patient belongings, including home O2. Pt discharged with friend via personal car. Telemetry removed and notified.

## 2023-01-15 NOTE — Progress Notes (Signed)
Progress Note  Patient Name: Lauren Clark Date of Encounter: 01/15/2023  Primary Cardiologist: Jodelle Red, MD  Subjective   No acute events overnight. Patient has mild DOE with ambulation and also hypoxic with ambulation. No hypoxia with rest.  No angina.   Inpatient Medications    Scheduled Meds:  amiodarone  200 mg Oral BID   apixaban  5 mg Oral BID   FLUoxetine  40 mg Oral Daily   latanoprost  1 drop Right Eye QHS   levothyroxine  88 mcg Oral Q0600   metoprolol tartrate  37.5 mg Oral BID   pantoprazole  40 mg Oral Daily   rosuvastatin  10 mg Oral Daily   sodium chloride flush  3 mL Intravenous Q12H   traZODone  50 mg Oral QHS   Continuous Infusions:  sodium chloride     PRN Meds: sodium chloride, acetaminophen, ondansetron (ZOFRAN) IV, sodium chloride flush   Vital Signs    Vitals:   01/14/23 2040 01/15/23 0019 01/15/23 0439 01/15/23 0804  BP: (!) 143/68 136/72 (!) 125/53 (!) 134/55  Pulse: 62 71 62   Resp: 20 17 15 17   Temp: 98.4 F (36.9 C) 98.1 F (36.7 C) 97.6 F (36.4 C) 98.2 F (36.8 C)  TempSrc: Oral Oral Oral Oral  SpO2: 95% 94% 92%   Weight:   93.4 kg   Height:        Intake/Output Summary (Last 24 hours) at 01/15/2023 0933 Last data filed at 01/14/2023 2142 Gross per 24 hour  Intake 240 ml  Output --  Net 240 ml   Filed Weights   01/13/23 0455 01/14/23 0500 01/15/23 0439  Weight: 98 kg 93.4 kg 93.4 kg    Telemetry     Personally reviewed, NSR.  ECG    Not performed today  Physical Exam   GEN: No acute distress.   Neck: No JVD. Cardiac: RRR, no murmur, rub, or gallop.  Respiratory: Nonlabored. Clear to auscultation bilaterally. GI: Soft, nontender, bowel sounds present. MS: No edema; No deformity. Neuro:  Nonfocal. Psych: Alert and oriented x 3. Normal affect.  Labs    Chemistry Recent Labs  Lab 01/11/23 1053 01/12/23 0224 01/13/23 0049 01/14/23 0103 01/15/23 0043  NA 139   < > 138 136 139  K 4.0   <  > 3.2* 4.2 4.0  CL 107   < > 99 99 102  CO2 20*   < > 30 28 29   GLUCOSE 130*   < > 107* 101* 114*  BUN 14   < > 22 31* 27*  CREATININE 0.93   < > 1.26* 1.34* 1.26*  CALCIUM 8.7*   < > 8.4* 8.6* 8.6*  PROT 6.6  --   --   --   --   ALBUMIN 3.2*  --   --   --   --   AST 22  --   --   --   --   ALT 26  --   --   --   --   ALKPHOS 102  --   --   --   --   BILITOT 0.7  --   --   --   --   GFRNONAA >60   < > 47* 43* 47*  ANIONGAP 12   < > 9 9 8    < > = values in this interval not displayed.     Hematology Recent Labs  Lab 01/11/23 1053  WBC 11.7*  RBC  4.37  HGB 13.0  HCT 41.9  MCV 95.9  MCH 29.7  MCHC 31.0  RDW 14.6  PLT 295    Cardiac EnzymesNo results for input(s): "TROPONINIHS" in the last 720 hours.  BNP Recent Labs  Lab 01/11/23 1054  BNP 890.7*     DDimerNo results for input(s): "DDIMER" in the last 168 hours.   Radiology    No results found.   Assessment & Plan   # Acute on chronic diastolic heart failure exacerbation complicated by AKI # Paroxysmal A-fib with RVR, NSR now -Presented with DOE, palpitations and progressive worsening lower EXTR swelling x 3 to 4 weeks. Chest x-ray showed pulmonary edema. BNP elevated, 890. Symptoms of DOE and bilateral lower extremity swelling improved with IV Lasix administration. But hospital course was complicated by AKI due to which IV Lasix was discontinued.  Serum creatinine stable at 1.2 this a.m. but she presented with 0.9 serum creatinine. Hold diuretics and can go home on p.o. torsemide 20 mg as needed for SOB.  SGLT2 inhibitors and spironolactone can be started in the outpatient setting. -Telemetry reviewed, NSR.  Continue metoprolol tartrate 37.5 mg twice daily, amiodarone 200 mg twice daily for 3 weeks followed by amiodarone 200 mg once daily, Eliquis 5 mg twice daily.  # Ascending aorta aneurysm 5 cm with intramural hematoma -Follow-up with outpatient CTS.  # Acute versus acute on chronic hypoxic respiratory  failure likely multifactorial, HFpEF versus/and COPD -Patient appears to be volume compensated on examination.  Although she has DOE and hypoxia with ambulation.  Obtain PFTs on discharge.  [facility] will sign off.   Medication Recommendations: P.o. torsemide 20 mg as needed for SOB/LE swelling upon discharge, metoprolol tartrate 37.5 mg twice daily, amiodarone 200 mg twice daily for 3 weeks followed by amiodarone 200 mg once daily, Eliquis 5 mg twice daily. Other recommendations (labs, testing, etc): PFTs, BMP in 5 days Follow up as an outpatient: Cardiology follow-up in 2 to 4 weeks upon discharge.   Signed, Marjo Bicker, MD  01/15/2023, 9:33 AM

## 2023-01-15 NOTE — Discharge Summary (Signed)
Physician Discharge Summary   Patient: Lauren Clark MRN: [id number] DOB: 06/25/1956  Admit date:     01/11/2023  Discharge date: 01/15/23  Discharge Physician: Vassie Loll   PCP: Ellyn Hack, MD   Recommendations at discharge:  Repeat basic metabolic panel to follow ultralights renal function Patient patient has follow-up with cardiology service as instructed to further adjust rate controlling agents. Repeat CBC to follow hemoglobin trend/stability.  Discharge Diagnoses: Principal Problem:   Acute on chronic diastolic CHF (congestive heart failure) (HCC) Active Problems:   Atrial fibrillation with rapid ventricular response (HCC)   Essential hypertension   AKI (acute kidney injury) (HCC)   Hyperlipidemia   Aortic dissection (HCC)   Hypothyroidism   Class 3 obesity [facility])  Hospital Course: Lauren Clark was admitted to the hospital with the working diagnosis of heart failure exacerbation.   67 yo female with the past medical history of hypothyroidism, hypertension, paroxysmal atrial fibrillation, ascending aortic aneurysm with dissection and intramural thrombosis vs aorta  hematoma, who presented with dyspnea and palpitations. Reported worsening dyspnea over the last 3 to 4 weeks, along with bilateral lowe extremity edema. On her initial physical examination her blood pressure was 144/115, 119/75, HR 67, RR 32 and 02 saturation 96%, lungs with rales bilaterally with increased work of breathing, heart with S1 and S2 present, regular, abdomen with no distention and positive lower extremity edema.   Chest radiograph with cardiomegaly, bilateral hilar vascular congestion, bilateral symmetrical interstitial infiltrates, more predominant at the lower lobes.   EKG 59 bpm, normal axis, normal intervals, sinus rhythm with poor R R wave progression, no significant ST segment changes, negative T wave lead V1 and V2.   Telemetry patient with atrial fibrillation. Volume status has improved.    Assessment and Plan: * Acute on chronic diastolic CHF (congestive heart failure) (HCC) -Echocardiogram with preserved LV systolic function with EF 60 to 65%, no LVH, RV systolic function is preserved, LA and RA with no enlargement, no significant valvular disease.  -Ascending aortic aneurysm 4.9 cm (continue patient follow-up with vascular surgery as previously instructed)..  -Excellent response to IV diuresis -At discharge not requiring oxygen supplementation, breathing comfortable and feeling ready to go home -Patient discharge on as needed Lasix and instruction to check weight on daily basis -Continue the use of metoprolol 25 mg twice a day; no RAAS inhibitors or SGL2 due to renal function. -continue outpatient follow up with cardiology service. -2[name] supplementation mainly on exertion has been arranged at discharge.  Atrial fibrillation with rapid ventricular response (HCC) -A. Fib with rate controlled -continue metoprolol and amiodarone  -continue eliquis for secondary precaution.  AKI (acute kidney injury) (HCC)/hypokalemia -electrolytes corrected -renal function stable and trending down at discharge -continue minimizing nephrotoxic agents -maintain adequate hydration -follow renal function trend at follow up visit  Essential hypertension -Continue metoprolol for blood pressure control.   Hyperlipidemia -Continue rosuvastatin.  -Heart healthy diet discussed with patient.  Aortic dissection (HCC) Ascending aorta and aortic root aneurysm  Conservative management.  Follow up with CT surgery as outpatient.   Hypothyroidism -Continue with levothyroxine.   Class 3 obesity (HCC) Calculated BMI 42.1  -Low-calorie diet, portion control and increase physical activity discussed with patient.  Consultants: Cardiology service Procedures performed: See below for x-ray reports Disposition: Home Diet recommendation: Heart healthy/low-sodium diet.  DISCHARGE  MEDICATION: Allergies as of 01/15/2023       Reactions   Crestor [rosuvastatin Calcium] Other (See Comments)   Muscle aches  Lipitor [atorvastatin] Other (See Comments)   Muscle aches   Hctz [hydrochlorothiazide] Other (See Comments)   Pt doesn't remember        Medication List     TAKE these medications    amiodarone 200 MG tablet Commonly known as: Pacerone 200 mg twice a day x 2 weeks; then 200 mg daily. What changed:  how much to take how to take this when to take this additional instructions   apixaban 5 MG Tabs tablet Commonly known as: ELIQUIS Take 1 tablet (5 mg total) by mouth 2 (two) times daily.   CALCIUM 600 PO Take 600 mg by mouth daily.   FLUoxetine 40 MG capsule Commonly known as: PROZAC Take 40 mg by mouth daily.   furosemide 20 MG tablet Commonly known as: Lasix Take 2 tablets (40 mg total) by mouth daily as needed for fluid or edema.   latanoprost 0.005 % ophthalmic solution Commonly known as: XALATAN Place 1 drop into the right eye at bedtime.   levothyroxine 88 MCG tablet Commonly known as: SYNTHROID Take 88 mcg by mouth daily before breakfast.   magnesium oxide 400 (240 Mg) MG tablet Commonly known as: MAG-OX Take 400 mg by mouth 2 (two) times daily.   metoprolol tartrate 25 MG tablet Commonly known as: LOPRESSOR Take 1 tablet (25 mg total) by mouth 2 (two) times daily. What changed: Another medication with the same name was removed. Continue taking this medication, and follow the directions you see here.   MINERIN CREME EX Apply 1 application  topically daily.   pantoprazole 40 MG tablet Commonly known as: Protonix Take 1 tablet (40 mg total) by mouth daily.   rosuvastatin 10 MG tablet Commonly known as: CRESTOR Take 10 mg by mouth daily.   traZODone 50 MG tablet Commonly known as: DESYREL Take 50 mg by mouth at bedtime.   Vitamin B-12 2500 MCG Subl Place 2,500 mcg under the tongue daily.               Durable  Medical Equipment  (From admission, onward)           Start     Ordered   01/14/23 1611  For home use only DME oxygen  Once       Question Answer Comment  Length of Need 12 Months   Mode or (Route) Nasal cannula   Liters per Minute 2   Frequency Continuous (stationary and portable oxygen unit needed)   Oxygen conserving device Yes   Oxygen delivery system Gas      01/14/23 1611            Follow-up Information     Alver Sorrow, NP Follow up on 02/03/2023.   Specialty: Cardiology Why: at 10am for your follow up with cardiology Contact information: [address] [place] [address] [phone]         Ellyn Hack, MD. Schedule an appointment as soon as possible for a visit in 10 day(s).   Specialty: Family Medicine Contact information: [address] [place] [address] [phone]                Discharge Exam: Ceasar Mons Weights   01/13/23 0455 01/14/23 0500 01/15/23 0439  Weight: 98 kg 93.4 kg 93.4 kg   General exam: Alert, awake, oriented x 3; in no acute distress and feeling ready to go home. Respiratory system: No wheezing, no cough, no using accessory muscle.  Good saturation on room air  at rest; 2 L found to be needed with exertion. Cardiovascular system: Rate controlled, no rubs, no gallops, no JVD. Gastrointestinal system: Abdomen is nondistended, soft and nontender. No organomegaly or masses felt. Normal bowel sounds heard. Central nervous system:  No focal neurological deficits. Extremities: No cyanosis or clubbing. Skin: No petechiae. Psychiatry: Judgement and insight appear normal. Mood & affect appropriate.    Condition at discharge: Stable and improved.  The results of significant diagnostics from this hospitalization (including imaging, microbiology, ancillary and laboratory) are listed below for reference.   Imaging Studies: DG CHEST PORT 1 VIEW  Result Date: 01/15/2023 CLINICAL DATA:  97293 CHF  (congestive heart failure) (HCC) [address]293 EXAM: PORTABLE CHEST 1 VIEW COMPARISON:  01/12/2023 FINDINGS: Stable cardiomegaly. Slightly improving left basilar opacity. Linear scarring or atelectasis in the right mid lung. No pleural effusion or pneumothorax. IMPRESSION: Slightly improving left basilar opacity. Electronically Signed   By: Duanne Guess D.O.   On: 01/15/2023 14:00   ECHOCARDIOGRAM COMPLETE  Result Date: 01/12/2023    ECHOCARDIOGRAM REPORT   Patient Name:   Lauren Clark Date of Exam: 01/12/2023 Medical Rec #:  [id number]    Height:       60.0 in Accession #:    [id number]   Weight:       215.8 lb Date of Birth:  31-Jan-1956    BSA:          1.928 m Patient Age:    67 years     BP:           113/76 mmHg Patient Gender: F            HR:           83 bpm. Exam Location:  Inpatient Procedure: 2D Echo, Cardiac Doppler and Color Doppler Indications:    R06.9 DOE  History:        Patient has prior history of Echocardiogram examinations, most                 recent 11/14/2022. CHF, Signs/Symptoms:Shortness of Breath and                 Dyspnea; Risk Factors:Hypertension, Dyslipidemia and Non-Smoker.  Sonographer:    Dondra Prader RVT RCS Referring Phys: [id number] SHENG L HALEY  Sonographer Comments: Suboptimal parasternal window. Image acquisition challenging due to respiratory motion and Image acquisition challenging due to patient body habitus. IMPRESSIONS  1. The aortic valve is tricuspid. There is mild calcification of the aortic valve. There is mild thickening of the aortic valve. Aortic valve regurgitation is mild. Aortic valve sclerosis/calcification is present, without any evidence of aortic stenosis.  2. Left ventricular ejection fraction, by estimation, is 60 to 65%. The left ventricle has normal function. The left ventricle has no regional wall motion abnormalities. Left ventricular diastolic parameters are indeterminate.  3. The mitral valve is normal in structure. Trivial mitral valve regurgitation. No  evidence of mitral stenosis.  4. Right ventricular systolic function is normal. The right ventricular size is normal.  5. The ascending aorta and aortic root are aneurysmal and each measure 4.9cm. Findings consistent with CTA.  6. The inferior vena cava is normal in size with greater than 50% respiratory variability, suggesting right atrial pressure of 3 mmHg. Comparison(s): No significant change from prior study. FINDINGS  Left Ventricle: Left ventricular ejection fraction, by estimation, is 60 to 65%. The left ventricle has normal function. The left ventricle has no regional wall motion abnormalities. The left ventricular  internal cavity size was normal in size. There is  no left ventricular hypertrophy. Left ventricular diastolic parameters are indeterminate. Right Ventricle: The right ventricular size is normal. No increase in right ventricular wall thickness. Right ventricular systolic function is normal. Left Atrium: Left atrial size was normal in size. Right Atrium: Right atrial size was normal in size. Pericardium: There is no evidence of pericardial effusion. Mitral Valve: The mitral valve is normal in structure. Trivial mitral valve regurgitation. No evidence of mitral valve stenosis. Tricuspid Valve: The tricuspid valve is normal in structure. Tricuspid valve regurgitation is trivial. Aortic Valve: The aortic valve is tricuspid. There is mild calcification of the aortic valve. There is mild thickening of the aortic valve. Aortic valve regurgitation is mild. Aortic valve sclerosis/calcification is present, without any evidence of aortic stenosis. Aortic valve mean gradient measures 3.3 mmHg. Aortic valve peak gradient measures 5.6 mmHg. Aortic valve area, by VTI measures 3.35 cm. Pulmonic Valve: The pulmonic valve was normal in structure. Pulmonic valve regurgitation is trivial. Aorta: The aortic root and ascending aorta are aneurysmal and each measure 4.9cm. Venous: The inferior vena cava is normal in  size with greater than 50% respiratory variability, suggesting right atrial pressure of 3 mmHg. IAS/Shunts: The atrial septum is grossly normal.  LEFT VENTRICLE PLAX 2D LVIDd:         4.10 cm   Diastology LVIDs:         2.30 cm   LV e' medial:    7.62 cm/s LV PW:         1.40 cm   LV E/e' medial:  16.3 LV IVS:        1.30 cm   LV e' lateral:   12.70 cm/s LVOT diam:     1.90 cm   LV E/e' lateral: 9.8 LV SV:         71 LV SV Index:   37 LVOT Area:     2.84 cm  RIGHT VENTRICLE             IVC RV S prime:     10.90 cm/s  IVC diam: 2.00 cm TAPSE (M-mode): 1.4 cm LEFT ATRIUM             Index        RIGHT ATRIUM           Index LA diam:        3.80 cm 1.97 cm/m   RA Area:     14.50 cm LA Vol (A2C):   53.5 ml 27.75 ml/m  RA Volume:   38.40 ml  19.92 ml/m LA Vol (A4C):   53.3 ml 27.64 ml/m LA Biplane Vol: 53.9 ml 27.96 ml/m  AORTIC VALVE                     PULMONIC VALVE AV Area (Vmax):    3.47 cm      PV Vmax:       0.95 m/s AV Area (Vmean):   3.90 cm      PV Peak grad:  3.6 mmHg AV Area (VTI):     3.35 cm AV Vmax:           118.43 cm/s AV Vmean:          79.267 cm/s AV VTI:            0.210 m AV Peak Grad:      5.6 mmHg AV Mean Grad:      3.3 mmHg  LVOT Vmax:         145.00 cm/s LVOT Vmean:        109.000 cm/s LVOT VTI:          0.249 m LVOT/AV VTI ratio: 1.18  AORTA Ao Root diam: 4.90 cm Ao Asc diam:  4.90 cm MITRAL VALVE MV Area (PHT): 5.54 cm     SHUNTS MV Decel Time: 137 msec     Systemic VTI:  0.25 m MV E velocity: 124.00 cm/s  Systemic Diam: 1.90 cm MV A velocity: 26.20 cm/s MV E/A ratio:  4.73 Laurance Flatten MD Electronically signed by Laurance Flatten MD Signature Date/Time: 01/12/2023/7:03:36 PM    Final    DG Chest 1 View  Result Date: 01/12/2023 CLINICAL DATA:  CHF EXAM: CHEST  1 VIEW COMPARISON:  CXR 01/11/23. FINDINGS: Cardiomegaly. Possible trace left pleural effusion. Increased left basilar opacity, which could represent progressive atelectasis or infection. Radiographically apparent  displaced rib fractures. Visualized upper abdomen is unremarkable IMPRESSION: 1. Increased left basilar opacity, which could represent progressive atelectasis or infection. 2. Possible trace left pleural effusion. Electronically Signed   By: Lorenza Cambridge M.D.   On: 01/12/2023 08:38   DG Chest Port 1 View  Result Date: 01/11/2023 CLINICAL DATA:  Provided history: Shortness of breath. Increased leg swelling. EXAM: PORTABLE CHEST 1 VIEW COMPARISON:  Prior chest radiographs 11/15/2022 and earlier. Chest CT 01/04/2023. FINDINGS: Cardiomegaly. Central pulmonary vascular congestion. Interstitial and ill-defined airspace opacities bilaterally (with a mid and lower lung predominance). The abdominal soft tissues partially obscure the left lateral costophrenic angle. Within this limitation, there is no appreciable pleural effusion. No evidence of pneumothorax. No acute osseous abnormality identified. IMPRESSION: 1. Cardiomegaly with central pulmonary vascular congestion. 2. Interstitial and ill-defined airspace opacities bilaterally (with a mid and lower lung predominance), which may reflect pulmonary edema and/or pneumonia. 3. The abdominal soft tissues partially obscure the left lateral costophrenic angle. Within this limitation, there is no appreciable pleural effusion. Electronically Signed   By: Jackey Loge D.O.   On: 01/11/2023 11:37   DG BONE DENSITY (DXA)  Result Date: 01/09/2023 EXAM: DUAL X-RAY ABSORPTIOMETRY (DXA) FOR BONE MINERAL DENSITY IMPRESSION: Referring Physician:  Kathi Ludwig ASAD [facility] Your patient completed a bone mineral density test using GE Lunar iDXA system (analysis version: 16). Technologist:      lmn PATIENT: Name: Lauren Clark, Lauren Clark Patient ID: [id number] Birth Date: 12/07/1955 Height: 57.5 in. Sex: Female Measured: 01/09/2023 Weight: 213.8 lbs. Indications: Bariatric surgery, Bilateral Oophorectomy (65.51), Caucasian, Depression, Estrogen Deficient, Hypothyroid, Hysterectomy, Levothyroxine,  Pantoprazole, Postmenopausal, Prozac, Right hip replacement, Secondary Osteoporosis, Topamax Fractures: None Treatments: Calcium (E943.0), Vitamin D (E933.5) ASSESSMENT: The BMD measured at Femur Neck is 0.723 g/cm2 with a T-score of -2.3. This patient is considered osteopenic/low bone mass according to World Health Organization [facility]) criteria. The quality of the exam is limited by patient body habitus. L4 was excluded due to degenerative changes. Right hip excluded due to surgical hardware. Site Region Measured Date Measured Age YA BMD Significant CHANGE T-score Left Femur Neck 01/09/2023 68.0 -2.3 0.723 g/cm2 Left Femur Neck 06/11/2020 65.4 -2.4 0.703 g/cm2 AP Spine L1-L3 01/09/2023 68.0 -0.2 1.146 g/cm2 * AP Spine L1-L3 06/11/2020 65.4 -0.8 1.077 g/cm2 Left Forearm Radius 33% 01/09/2023 68.0 -1.0 0.787 g/cm2 World Health Organization [facility]) criteria for post-menopausal, Caucasian Women: Normal       T-score at or above -1 SD Osteopenia   T-score between -1 and -2.5 SD Osteoporosis T-score at or below -2.5 SD RECOMMENDATION: 1.  All patients should optimize calcium and vitamin D intake. 2. Consider FDA-approved medical therapies in postmenopausal women and men aged 47 years and older, based on the following: a. A hip or vertebral (clinical or morphometric) fracture. b. T-score = -2.5 at the femoral neck or spine after appropriate evaluation to exclude secondary causes. c. Low bone mass (T-score between -1.0 and -2.5 at the femoral neck or spine) and a 10-year probability of a hip fracture = 3% or a 10-year probability of a major osteoporosis-related fracture = 20% based on the [place]-adapted WHO algorithm. d. Clinician judgment and/or patient preferences may indicate treatment for people with 10-year fracture probabilities above or below these levels. FOLLOW-UP: Patients with diagnosis of osteoporosis or at high risk for fracture should have regular bone mineral density tests.? Patients eligible for Medicare are allowed  routine testing every 2 years.? The testing frequency can be increased to one year for patients who have rapidly progressing disease, are receiving or discontinuing medical therapy to restore bone mass, or have additional risk factors. I have reviewed this study and agree with the findings. [facility] FRAX* 10-year Probability of Fracture Based on femoral neck BMD: Femur (Left) Major Osteoporotic Fracture: 10.9% Hip Fracture:                2.0% Population:                  [place] (Caucasian) Risk Factors:                Secondary Osteoporosis *FRAX is a Armed forces logistics/support/administrative officer of the [employer] of [employer] for Metabolic Bone Disease, a World Science writer (WHO) [employer]. ASSESSMENT: The probability of a major osteoporotic fracture is 10.9% within the next ten years. The probability of a hip fracture is 2.0% within the next ten years. Electronically Signed   By: Romona Curls M.D.   On: 01/09/2023 09:38   CT ANGIO CHEST AORTA W/CM & OR WO/CM  Result Date: 01/04/2023 CLINICAL DATA:  Aortic aneurysm suspected EXAM: CT ANGIOGRAPHY CHEST WITH CONTRAST TECHNIQUE: Multidetector CT imaging of the chest was performed using the standard protocol during bolus administration of intravenous contrast. Multiplanar CT image reconstructions and MIPs were obtained to evaluate the vascular anatomy. RADIATION DOSE REDUCTION: This exam was performed according to the departmental dose-optimization program which includes automated exposure control, adjustment of the mA and/or kV according to patient size and/or use of iterative reconstruction technique. CONTRAST:  75mL ISOVUE-370 IOPAMIDOL (ISOVUE-370) INJECTION 76% COMPARISON:  11/18/2022 FINDINGS: Cardiovascular: Preferential opacification of the thoracic aorta. Unchanged aneurysmal dilatation of the tubular ascending thoracic aorta, measuring 5.0 x 4.8 cm in maximum dimension, previously 4.9 x 4.8 cm when measured similarly. Right  eccentric, intermediate attenuation chronic intramural hematoma is similar, measuring up to 1.0 cm in thickness. Distal arch and descending thoracic aorta are normal in caliber measuring up to 2.7 x 2.6 cm. Previously seen intramural hematoma component extending along the descending thoracic aorta has resolved. Mild mixed aortic atherosclerosis. Cardiomegaly. Three-vessel coronary artery calcifications. No pericardial effusion. Mediastinum/Nodes: No enlarged mediastinal, hilar, or axillary lymph nodes. Thyroid gland, trachea, and esophagus demonstrate no significant findings. Lungs/Pleura: Unchanged small left pleural effusion and associated atelectasis or consolidation. Bland appearing, bandlike scarring of the lung bases. Upper Abdomen: No acute abnormality. Status post sleeve partial gastrectomy. Musculoskeletal: No chest wall abnormality. No acute osseous findings. Review of the MIP images confirms the above findings. IMPRESSION: 1. Unchanged aneurysmal dilatation of the tubular ascending thoracic aorta,  measuring 5.0 x 4.8 cm in maximum dimension, previously 4.9 x 4.8 cm when measured similarly. 2. Right eccentric, intermediate attenuation chronic intramural hematoma is similar, measuring up to 1.0 cm in thickness. 3. Distal arch and descending thoracic aorta are normal in caliber measuring up to 2.7 x 2.6 cm. Previously seen intramural hematoma component extending along the descending thoracic aorta has resolved. 4. Unchanged small left pleural effusion and associated atelectasis or consolidation. 5. Cardiomegaly and coronary artery disease. Aortic Atherosclerosis (ICD10-I70.0). Electronically Signed   By: Jearld Lesch M.D.   On: 01/04/2023 14:44    Microbiology: Results for orders placed or performed during the hospital encounter of 01/11/23  Resp panel by RT-PCR (RSV, Flu A&B, Covid) Anterior Nasal Swab     Status: None   Collection Time: 01/11/23 10:54 AM   Specimen: Anterior Nasal Swab  Result  Value Ref Range Status   SARS Coronavirus 2 by RT PCR NEGATIVE NEGATIVE Final   Influenza A by PCR NEGATIVE NEGATIVE Final   Influenza B by PCR NEGATIVE NEGATIVE Final    Comment: (NOTE) The Xpert Xpress SARS-CoV-2/FLU/RSV plus assay is intended as an aid in the diagnosis of influenza from Nasopharyngeal swab specimens and should not be used as a sole basis for treatment. Nasal washings and aspirates are unacceptable for Xpert Xpress SARS-CoV-2/FLU/RSV testing.  Fact Sheet for Patients: BloggerCourse.com  Fact Sheet for Healthcare Providers: SeriousBroker.it  This test is not yet approved or cleared by the [place] FDA and has been authorized for detection and/or diagnosis of SARS-CoV-2 by FDA under an Emergency Use Authorization (EUA). This EUA will remain in effect (meaning this test can be used) for the duration of the COVID-19 declaration under Section 564(b)(1) of the Act, 21 U.S.C. section 360bbb-3(b)(1), unless the authorization is terminated or revoked.     Resp Syncytial Virus by PCR NEGATIVE NEGATIVE Final    Comment: (NOTE) Fact Sheet for Patients: BloggerCourse.com  Fact Sheet for Healthcare Providers: SeriousBroker.it  This test is not yet approved or cleared by the [place] FDA and has been authorized for detection and/or diagnosis of SARS-CoV-2 by FDA under an Emergency Use Authorization (EUA). This EUA will remain in effect (meaning this test can be used) for the duration of the COVID-19 declaration under Section 564(b)(1) of the Act, 21 U.S.C. section 360bbb-3(b)(1), unless the authorization is terminated or revoked.  Performed at [facility], [address], [place], [place] [address]     Labs: CBC: Recent Labs  Lab 01/11/23 1053  WBC 11.7*  NEUTROABS 9.6*  HGB 13.0  HCT 41.9  MCV 95.9  PLT 295   Basic Metabolic Panel: Recent  Labs  Lab 01/11/23 1053 01/12/23 0224 01/13/23 0049 01/14/23 0103 01/15/23 0043  NA 139 139 138 136 139  K 4.0 3.6 3.2* 4.2 4.0  CL 107 102 99 99 102  CO2 20* 28 30 28 29   GLUCOSE 130* 108* 107* 101* 114*  BUN 14 13 22  31* 27*  CREATININE 0.93 1.10* 1.26* 1.34* 1.26*  CALCIUM 8.7* 8.7* 8.4* 8.6* 8.6*  MG 2.0  --  1.8  --   --   PHOS 3.0  --   --   --   --    Liver Function Tests: Recent Labs  Lab 01/11/23 1053  AST 22  ALT 26  ALKPHOS 102  BILITOT 0.7  PROT 6.6  ALBUMIN 3.2*   CBG: No results for input(s): "GLUCAP" in the last 168 hours.  Discharge time spent:  greater than 30 minutes.  Signed: Vassie Loll, MD [facility] 01/15/2023

## 2023-01-15 NOTE — Progress Notes (Signed)
Mobility Specialist Progress Note    01/15/23 0950  Mobility  Activity Ambulated with assistance in hallway  Level of Assistance Contact guard assist, steadying assist  Assistive Device Front wheel walker  Distance Ambulated (ft) 300 ft  Activity Response Tolerated well  Mobility Referral Yes  $Mobility charge 1 Mobility  Mobility Specialist Start Time (ACUTE ONLY) 0933  Mobility Specialist Stop Time (ACUTE ONLY) 0948  Mobility Specialist Time Calculation (min) (ACUTE ONLY) 15 min   Pre-Mobility: 59 HR, 93% SpO2 on RA During Mobility: 77 HR, 89% SpO2 on RA Post-Mobility: 66 HR, 95% SpO2 on RA  Pt received in chair and agreeable. No complaints on walk. SpO2 91-93% during ambulation on RA. Returned to chair and SpO2 briefly dropped to 89% on RA. Left with call bell in reach.   [place] Mobility Specialist  Please Neurosurgeon or Rehab Office at [phone]

## 2023-01-15 NOTE — TOC Transition Note (Signed)
Transition of Care [facility]) - CM/SW Discharge Note   Patient Details  Name: Lauren Clark MRN: [id number] Date of Birth: 04-24-1956  Transition of Care [facility] CM/SW Contact:  Ronny Bacon, RN Phone Number: 01/15/2023, 2:52 PM   Clinical Narrative:   Patient is being discharged home today. Patient confirmed that she has oxygen at bedside for transport home and that she has family that will pick her up at discharge. [name] aware that patient is being discharged home.     Final next level of care: Home w Home Health Services Barriers to Discharge: No Barriers Identified   Patient Goals and CMS Choice   Choice offered to / list presented to : NA  Discharge Placement                         Discharge Plan and Services Additional resources added to the After Visit Summary for   In-house Referral: NA Discharge Planning Services: CM Consult Post Acute Care Choice: NA          DME Arranged: N/A DME Agency: NA       HH Arranged: NA          Social Determinants of Health (SDOH) Interventions SDOH Screenings   Food Insecurity: No Food Insecurity (01/11/2023)  Housing: Low Risk  (01/11/2023)  Transportation Needs: No Transportation Needs (01/11/2023)  Utilities: Not At Risk (01/11/2023)  Depression (PHQ2-9): Low Risk  (09/02/2019)  Tobacco Use: Low Risk  (01/11/2023)     Readmission Risk Interventions     No data to display

## 2023-01-15 NOTE — Plan of Care (Signed)
  Problem: Education: Goal: Knowledge of General Education information will improve Description: Including pain rating scale, medication(s)/side effects and non-pharmacologic comfort measures Outcome: Adequate for Discharge   Problem: Clinical Measurements: Goal: Will remain free from infection Outcome: Adequate for Discharge Goal: Cardiovascular complication will be avoided Outcome: Adequate for Discharge   Problem: Activity: Goal: Risk for activity intolerance will decrease Outcome: Adequate for Discharge   Problem: Nutrition: Goal: Adequate nutrition will be maintained Outcome: Adequate for Discharge   Problem: Coping: Goal: Level of anxiety will decrease Outcome: Adequate for Discharge   Problem: Elimination: Goal: Will not experience complications related to bowel motility Outcome: Adequate for Discharge Goal: Will not experience complications related to urinary retention Outcome: Adequate for Discharge   Problem: Pain Managment: Goal: General experience of comfort will improve Outcome: Adequate for Discharge   Problem: Safety: Goal: Ability to remain free from injury will improve Outcome: Adequate for Discharge   Problem: Skin Integrity: Goal: Risk for impaired skin integrity will decrease Outcome: Adequate for Discharge

## 2023-02-03 ENCOUNTER — Ambulatory Visit (INDEPENDENT_AMBULATORY_CARE_PROVIDER_SITE_OTHER): Payer: 59 | Admitting: Family

## 2023-02-03 ENCOUNTER — Encounter (HOSPITAL_BASED_OUTPATIENT_CLINIC_OR_DEPARTMENT_OTHER): Payer: Self-pay | Admitting: Family

## 2023-02-03 VITALS — BP 138/68 | HR 47 | Ht 60.0 in | Wt 206.0 lb

## 2023-02-03 DIAGNOSIS — I480 Paroxysmal atrial fibrillation: Secondary | ICD-10-CM

## 2023-02-03 DIAGNOSIS — I10 Essential (primary) hypertension: Secondary | ICD-10-CM

## 2023-02-03 DIAGNOSIS — D6859 Other primary thrombophilia: Secondary | ICD-10-CM | POA: Diagnosis not present

## 2023-02-03 DIAGNOSIS — I5032 Chronic diastolic (congestive) heart failure: Secondary | ICD-10-CM | POA: Diagnosis not present

## 2023-02-03 DIAGNOSIS — Z79899 Other long term (current) drug therapy: Secondary | ICD-10-CM

## 2023-02-03 MED ORDER — METOPROLOL TARTRATE 25 MG PO TABS
25.0000 mg | ORAL_TABLET | Freq: Two times a day (BID) | ORAL | 1 refills | Status: AC
Start: 2023-02-03 — End: ?

## 2023-02-03 MED ORDER — APIXABAN 5 MG PO TABS
5.0000 mg | ORAL_TABLET | Freq: Two times a day (BID) | ORAL | 1 refills | Status: DC
Start: 2023-02-03 — End: 2023-08-08

## 2023-02-03 MED ORDER — AMIODARONE HCL 200 MG PO TABS
200.0000 mg | ORAL_TABLET | Freq: Every day | ORAL | 1 refills | Status: AC
Start: 2023-02-03 — End: ?

## 2023-02-03 MED ORDER — FUROSEMIDE 20 MG PO TABS
ORAL_TABLET | ORAL | 1 refills | Status: DC
Start: 2023-02-03 — End: 2023-10-04

## 2023-02-03 NOTE — Progress Notes (Signed)
Cardiology Office Note:  .   Date:  02/03/2023  ID:  JUHI LAGRANGE, DOB 1956-03-29, MRN [id number] PCP: Ellyn Hack, MD  [facility] Providers Cardiologist:  Jodelle Red, MD    History of Present Illness: .   Lauren Clark is a 67 y.o. female atrial fibrillation, chronic diastolic heart failure, hypokalemia, hypertension, hyperlipidemia, ascending aorta and aortic root aneurysm, hypothyroidism, morbid obesity s/p gastric sleeve resection, DVT.  Admitted 5/11 - 11/19/2022 with dissection of ascending aorta with evidence of chronic intramural hematoma along right lateral ascending aortic wall  not candidate for surgical resection and recommended to remain off OAC.  Plan for outpatient palliative care follow-up.  Admitted 7/10 to symptoms 14/24 with acute on chronic diastolic heart failure.  Echo 01/12/2023 LVEF 60 to 65%, no LVH, RV SF preserved, no significant valvular disease, ascending aortic aneurysm 4.9 cm.  Treated with IV diuresis and discharged on as needed Lasix.  No ACE/ARB/Arni or SGLT2 due to renal function.  Discharged on 2 L nasal cannula.Eliquis resumed prior to discharge.  Presents today for follow up with her friend [name]. Notes dyspnea with exertion. She has been wearing her oxygen periodically at home and does feel it helps particularly with activity. Working with home health at home. Notes mild orthopnea but no PND. She is taking the Furosemide 20mg  daily. Notes chest pain only after eating certain foods, no exertional chest discomfort. She was unaware of atrial fibrillation in the hospital - no palpitations since discharge. Seeing her PCP later today.   ROS: Please see the history of present illness.    All other systems reviewed and are negative.   Studies Reviewed: Marland Kitchen   EKG Interpretation Date/Time:  Friday February 03 2023 09:58:17 EDT Ventricular Rate:  47 PR Interval:  238 QRS Duration:  86 QT Interval:  502 QTC Calculation: 444 R Axis:   79  Text  Interpretation: Sinus bradycardia with 1st degree A-V block Confirmed by Gillian Shields ([id number]) on 02/03/2023 10:05:12 AM    Cardiac Studies & Procedures       ECHOCARDIOGRAM  ECHOCARDIOGRAM COMPLETE 01/12/2023  Narrative ECHOCARDIOGRAM REPORT    Patient Name:   Lauren Clark Date of Exam: 01/12/2023 Medical Rec #:  [id number]    Height:       60.0 in Accession #:    [id number]   Weight:       215.8 lb Date of Birth:  Jan 20, 1956    BSA:          1.928 m Patient Age:    67 years     BP:           113/76 mmHg Patient Gender: F            HR:           83 bpm. Exam Location:  Inpatient  Procedure: 2D Echo, Cardiac Doppler and Color Doppler  Indications:    R06.9 DOE  History:        Patient has prior history of Echocardiogram examinations, most recent 11/14/2022. CHF, Signs/Symptoms:Shortness of Breath and Dyspnea; Risk Factors:Hypertension, Dyslipidemia and Non-Smoker.  Sonographer:    Dondra Prader RVT RCS Referring Phys: [id number] SHENG L HALEY   Sonographer Comments: Suboptimal parasternal window. Image acquisition challenging due to respiratory motion and Image acquisition challenging due to patient body habitus. IMPRESSIONS   1. The aortic valve is tricuspid. There is mild calcification of the aortic valve. There is mild thickening of the aortic valve. Aortic  valve regurgitation is mild. Aortic valve sclerosis/calcification is present, without any evidence of aortic stenosis. 2. Left ventricular ejection fraction, by estimation, is 60 to 65%. The left ventricle has normal function. The left ventricle has no regional wall motion abnormalities. Left ventricular diastolic parameters are indeterminate. 3. The mitral valve is normal in structure. Trivial mitral valve regurgitation. No evidence of mitral stenosis. 4. Right ventricular systolic function is normal. The right ventricular size is normal. 5. The ascending aorta and aortic root are aneurysmal and each measure 4.9cm. Findings  consistent with CTA. 6. The inferior vena cava is normal in size with greater than 50% respiratory variability, suggesting right atrial pressure of 3 mmHg.  Comparison(s): No significant change from prior study.  FINDINGS Left Ventricle: Left ventricular ejection fraction, by estimation, is 60 to 65%. The left ventricle has normal function. The left ventricle has no regional wall motion abnormalities. The left ventricular internal cavity size was normal in size. There is no left ventricular hypertrophy. Left ventricular diastolic parameters are indeterminate.  Right Ventricle: The right ventricular size is normal. No increase in right ventricular wall thickness. Right ventricular systolic function is normal.  Left Atrium: Left atrial size was normal in size.  Right Atrium: Right atrial size was normal in size.  Pericardium: There is no evidence of pericardial effusion.  Mitral Valve: The mitral valve is normal in structure. Trivial mitral valve regurgitation. No evidence of mitral valve stenosis.  Tricuspid Valve: The tricuspid valve is normal in structure. Tricuspid valve regurgitation is trivial.  Aortic Valve: The aortic valve is tricuspid. There is mild calcification of the aortic valve. There is mild thickening of the aortic valve. Aortic valve regurgitation is mild. Aortic valve sclerosis/calcification is present, without any evidence of aortic stenosis. Aortic valve mean gradient measures 3.3 mmHg. Aortic valve peak gradient measures 5.6 mmHg. Aortic valve area, by VTI measures 3.35 cm.  Pulmonic Valve: The pulmonic valve was normal in structure. Pulmonic valve regurgitation is trivial.  Aorta: The aortic root and ascending aorta are aneurysmal and each measure 4.9cm.  Venous: The inferior vena cava is normal in size with greater than 50% respiratory variability, suggesting right atrial pressure of 3 mmHg.  IAS/Shunts: The atrial septum is grossly normal.   LEFT  VENTRICLE PLAX 2D LVIDd:         4.10 cm   Diastology LVIDs:         2.30 cm   LV e' medial:    7.62 cm/s LV PW:         1.40 cm   LV E/e' medial:  16.3 LV IVS:        1.30 cm   LV e' lateral:   12.70 cm/s LVOT diam:     1.90 cm   LV E/e' lateral: 9.8 LV SV:         71 LV SV Index:   37 LVOT Area:     2.84 cm   RIGHT VENTRICLE             IVC RV S prime:     10.90 cm/s  IVC diam: 2.00 cm TAPSE (M-mode): 1.4 cm  LEFT ATRIUM             Index        RIGHT ATRIUM           Index LA diam:        3.80 cm 1.97 cm/m   RA Area:     14.50 cm LA Vol (  A2C):   53.5 ml 27.75 ml/m  RA Volume:   38.40 ml  19.92 ml/m LA Vol (A4C):   53.3 ml 27.64 ml/m LA Biplane Vol: 53.9 ml 27.96 ml/m AORTIC VALVE                     PULMONIC VALVE AV Area (Vmax):    3.47 cm      PV Vmax:       0.95 m/s AV Area (Vmean):   3.90 cm      PV Peak grad:  3.6 mmHg AV Area (VTI):     3.35 cm AV Vmax:           118.43 cm/s AV Vmean:          79.267 cm/s AV VTI:            0.210 m AV Peak Grad:      5.6 mmHg AV Mean Grad:      3.3 mmHg LVOT Vmax:         145.00 cm/s LVOT Vmean:        109.000 cm/s LVOT VTI:          0.249 m LVOT/AV VTI ratio: 1.18  AORTA Ao Root diam: 4.90 cm Ao Asc diam:  4.90 cm  MITRAL VALVE MV Area (PHT): 5.54 cm     SHUNTS MV Decel Time: 137 msec     Systemic VTI:  0.25 m MV E velocity: 124.00 cm/s  Systemic Diam: 1.90 cm MV A velocity: 26.20 cm/s MV E/A ratio:  4.73  Laurance Flatten MD Electronically signed by Laurance Flatten MD Signature Date/Time: 01/12/2023/7:03:36 PM    Final             Risk Assessment/Calculations:    CHA2DS2-VASc Score = 3   This indicates a 3.2% annual risk of stroke. The patient's score is based upon: CHF History: 0 HTN History: 1 Diabetes History: 0 Stroke History: 0 Vascular Disease History: 0 Age Score: 1 Gender Score: 1            Physical Exam:   VS:  BP 138/68   Pulse (!) 47   Ht 5' (1.524 m)   Wt 206 lb (93.4 kg)    BMI 40.23 kg/m    Wt Readings from Last 3 Encounters:  02/03/23 206 lb (93.4 kg)  01/15/23 206 lb (93.4 kg)  01/04/23 215 lb (97.5 kg)    GEN: Well nourished, overweight, well developed in no acute distress NECK: No JVD; No carotid bruits CARDIAC: bradycardic, RRR, no murmurs, rubs, gallops RESPIRATORY:  Clear to auscultation without rales, wheezing or rhonchi  ABDOMEN: Soft, non-tender, non-distended EXTREMITIES:  No edema; No deformity   ASSESSMENT AND PLAN: .    Chronic diastolic heart failure- Euvolemic and well compensated on exam.  Continue Lasix 20 mg daily with additional 20 mg as needed for edema, fluid retention.  Will request PCP update CMP at her office visit later today.  If renal function stable consider addition of SGLT2. Low sodium diet, fluid restriction <2L, and daily weights encouraged. Educated to contact our office for weight gain of 2 lbs overnight or 5 lbs in one week. Due to multiple recent hospitalizations, unoperable ascending aortic aneurysm - refer to palliative care through [facility].  HTN - Home BP at goal <130/80. Continue present regimen.  PAF / Hypercoagulable  - SB by EKG today. No lightheadedness, dizziness. CHA2DS2-VASc Score = 3 [CHF History: 0, HTN History: 1, Diabetes  History: 0, Stroke History: 0, Vascular Disease History: 0, Age Score: 1, Gender Score: 1].  Therefore, the patient's annual risk of stroke is 3.2 %.    Continue amiodarone 200 mg daily, Eliquis 5 mg twice daily, metoprolol tartrate 25 mg twice daily.  Refills provided.  Denies bleeding, occasions on Eliquis.  Will request PCP update CMP, thyroid panel, CBC for monitoring on Amiodarone and OAC.  Ascending aortic aneurysm 5 cm with intramural-follows with Dr. Laneta Simmers. No plan for surgical intervention.  Chronic hypoxic respiratory failure/COPD- Declines referral to pulmonology. Dyspnea on exertion but none at rest. Wearing home 2L O2 with ambulation.        Dispo: follow up  in 3 months  Signed, Alver Sorrow, NP

## 2023-02-03 NOTE — Patient Instructions (Addendum)
Medication Instructions:   Continue Lasix 20mg  daily with an additional 20mg  as needed for swelling, fluid retention.   Let us know if you are requiring extra Lasix more than twice per week.   If your kidney function is stable, we may consider adding a medication called SGLT2 Marcelline Deist or Jardiance) to help prevent fluid retention and hopefully need less of the Lasix.   *If you need a refill on your cardiac medications before your next appointment, please call your pharmacy*   Lab Work: Recommend thyroid panel, CMP, CBC labwork.   This will let us monitor kidney function (to consider adding SGLT2), liver and thyroid function on Amiodarone, and ensure no anemia.  Testing/Procedures: Your EKG today showed sinus bradycardia which is a good result! No evidence of atrial fibrillation  Follow-Up: At [facility], you and your health needs are our priority.  As part of our continuing mission to provide you with exceptional heart care, we have created designated Provider Care Teams.  These Care Teams include your primary Cardiologist (physician) and Advanced Practice Providers (APPs -  Physician Assistants and Nurse Practitioners) who all work together to provide you with the care you need, when you need it.  We recommend signing up for the patient portal called "MyChart".  Sign up information is provided on this After Visit Summary.  MyChart is used to connect with patients for Virtual Visits (Telemedicine).  Patients are able to view lab/test results, encounter notes, upcoming appointments, etc.  Non-urgent messages can be sent to your provider as well.   To learn more about what you can do with MyChart, go to [email or web address].    Your next appointment:   3 month(s)  Provider:   Jodelle Red, MD or Gillian Shields, NP    Other Instructions We have  placed a referral to [facility] - we will keep you updated on anything we hear from them. If you do not hear  from them in 2 weeks, please call and let us know!

## 2023-03-11 LAB — LAB REPORT - SCANNED: EGFR: 52

## 2023-03-21 ENCOUNTER — Encounter: Payer: Self-pay | Admitting: Family Medicine

## 2023-04-05 ENCOUNTER — Telehealth: Payer: Self-pay | Admitting: Cardiology

## 2023-04-05 NOTE — Telephone Encounter (Signed)
Stop Metoprolol.  Increase Furosemide to 20mg  BID (one tablet AM and one at lunch) for 3 days then return to once daily dosing to help with dyspnea.   Alver Sorrow, NP

## 2023-04-05 NOTE — Telephone Encounter (Signed)
Pt c/o medication issue:  1. Name of Medication:   metoprolol tartrate (LOPRESSOR) 25 MG tablet    2. How are you currently taking this medication (dosage and times per day)? Take 1 tablet (25 mg total) by mouth 2 (two) times daily.   3. Are you having a reaction (difficulty breathing--STAT)? No  4. What is your medication issue? RN Lawson Fiscal is calling because she is with the patient for a home visit. Lawson Fiscal stated the patient saw her PCP on Friday and her HR was 46. Lawson Fiscal stated when she came today her HR was 46. Lawson Fiscal mentioned the patient has taken her BP medication this morning. Lawson Fiscal is requested we call her back at [phone]. Please advise.

## 2023-04-05 NOTE — Telephone Encounter (Signed)
Did call patient and offer her appointment tomorrow, patient stated no way of coming and "they can't do anything so why come"   Advised patient would forward to Ronn Melena NP and Dr Cristal Deer for review

## 2023-04-05 NOTE — Telephone Encounter (Signed)
Advised patient, verbalized understanding  

## 2023-04-05 NOTE — Telephone Encounter (Signed)
Spoke with Lawson Fiscal who states that pt Hr has been decreased. When checked at PCP it was noted to be 46 bpm. Lawson Fiscal checked with pulse ox and it was 40 bpm so she checked an apical pulse, and it was 46 bpm. Lawson Fiscal reports that the pt c/o being SOB, and fatigue for 1 week. Please advise.

## 2023-04-06 NOTE — Telephone Encounter (Signed)
Pt called back stating she was does not remember what was said yesterday about her medications. She asked if someone call her back to let her know or LVM with new instructions.

## 2023-04-06 NOTE — Telephone Encounter (Signed)
Returned call to patient, instructions reviewed patient wrote them down and verbalized understanding.

## 2023-05-08 NOTE — Progress Notes (Unsigned)
Cardiology Office Note:    Date:  05/09/2023  ID:  Lauren Clark, DOB Feb 09, 1956, MRN [id number] PCP: Ellyn Hack, MD  [facility] Providers Cardiologist:  Jodelle Red, MD       Patient Profile:      Atrial fibrillation Chronic diastolic heart failure Hypertension Hyperlipidemia Ascending aorta and aortic root aneurysm Hypothyroidism Morbid obesity S/P gastric sleeve resection DVT      History of Present Illness:   Lauren Clark is a 67 y.o. female who returns for follow-up for her heart failure.  Admitted 5/11 - 11/19/2022 due to dissection of ascending aorta. 5.0 cm ascending aortic aneurysm with evidence of a chronic intramural hematoma along the right lateral ascending aortic wall.  The previously seen intramural hematoma extending along the descending thoracic aorta has resolved  She is not a candidate for surgical resection recommended to remain off OAC.  Admitted 01/11/2023 with acute on chronic diastolic heart failure.  Echo 01/12/2023 showed EF 60 to 65%, no LVH, RV systolic function preserved, no significant valvular disease, ascending aortic aneurysm 4.9 cm.  Treated with IV diuresis and discharged home as needed Lasix.  Of note not on ACE/ARB/Arni or SGLT2 due to renal function.  Last seen by [facility] NP on 02/03/23 where she noted DOE.  Using O2 at home periodically and taking Lasix 20 mg daily.  She was euvolemic and well compensated on exam.  Due to her multiple recent hospitalizations, nonoperable ascending aortic aneurysm she was referred to palliative care.  Of note on 04/05/2023 her home health nurse noted multiple heart rates in the 40s metoprolol tartrate 25 mg was discontinued.   Today:  Patient is doing well.  She notes dyspnea on exertion has improved, she still continues to use home O2 periodically.  She denies SOB at rest.  She has lost weight now down to 2 or 3 from 215.  Blood pressure log BP average 110s over 60s, heart rate average 55-60  bpm.  She has lost weight down from 215 to 203.  She tells me that she has seen palliative care twice and has enjoyed their visits.  Her PCP started her on Jardiance 10 mg last month, patient notes improvement in leg swelling/SOB.  Her biggest complaint is generalized deconditioning.          Review of Systems  Constitutional: Negative for weight gain and weight loss.  Cardiovascular:  Positive for dyspnea on exertion. Negative for chest pain, claudication, cyanosis, irregular heartbeat, near-syncope, orthopnea, palpitations, paroxysmal nocturnal dyspnea and syncope.  Respiratory:  Positive for shortness of breath. Negative for cough and hemoptysis.   Gastrointestinal:  Negative for abdominal pain, hematochezia and melena.  Genitourinary:  Negative for hematuria.  Neurological:  Positive for weakness. Negative for headaches and light-headedness.     See HPI     Studies Reviewed:       Cardiac Studies & Procedures       ECHOCARDIOGRAM  ECHOCARDIOGRAM COMPLETE 01/12/2023  Narrative ECHOCARDIOGRAM REPORT    Patient Name:   Lauren Clark Date of Exam: 01/12/2023 Medical Rec #:  [id number]    Height:       60.0 in Accession #:    [id number]   Weight:       215.8 lb Date of Birth:  Mar 09, 1956    BSA:          1.928 m Patient Age:    67 years     BP:  113/76 mmHg Patient Gender: F            HR:           83 bpm. Exam Location:  Inpatient  Procedure: 2D Echo, Cardiac Doppler and Color Doppler  Indications:    R06.9 DOE  History:        Patient has prior history of Echocardiogram examinations, most recent 11/14/2022. CHF, Signs/Symptoms:Shortness of Breath and Dyspnea; Risk Factors:Hypertension, Dyslipidemia and Non-Smoker.  Sonographer:    Dondra Prader RVT RCS Referring Phys: [id number] SHENG L HALEY   Sonographer Comments: Suboptimal parasternal window. Image acquisition challenging due to respiratory motion and Image acquisition challenging due to patient body  habitus. IMPRESSIONS   1. The aortic valve is tricuspid. There is mild calcification of the aortic valve. There is mild thickening of the aortic valve. Aortic valve regurgitation is mild. Aortic valve sclerosis/calcification is present, without any evidence of aortic stenosis. 2. Left ventricular ejection fraction, by estimation, is 60 to 65%. The left ventricle has normal function. The left ventricle has no regional wall motion abnormalities. Left ventricular diastolic parameters are indeterminate. 3. The mitral valve is normal in structure. Trivial mitral valve regurgitation. No evidence of mitral stenosis. 4. Right ventricular systolic function is normal. The right ventricular size is normal. 5. The ascending aorta and aortic root are aneurysmal and each measure 4.9cm. Findings consistent with CTA. 6. The inferior vena cava is normal in size with greater than 50% respiratory variability, suggesting right atrial pressure of 3 mmHg.  Comparison(s): No significant change from prior study.  FINDINGS Left Ventricle: Left ventricular ejection fraction, by estimation, is 60 to 65%. The left ventricle has normal function. The left ventricle has no regional wall motion abnormalities. The left ventricular internal cavity size was normal in size. There is no left ventricular hypertrophy. Left ventricular diastolic parameters are indeterminate.  Right Ventricle: The right ventricular size is normal. No increase in right ventricular wall thickness. Right ventricular systolic function is normal.  Left Atrium: Left atrial size was normal in size.  Right Atrium: Right atrial size was normal in size.  Pericardium: There is no evidence of pericardial effusion.  Mitral Valve: The mitral valve is normal in structure. Trivial mitral valve regurgitation. No evidence of mitral valve stenosis.  Tricuspid Valve: The tricuspid valve is normal in structure. Tricuspid valve regurgitation is trivial.  Aortic  Valve: The aortic valve is tricuspid. There is mild calcification of the aortic valve. There is mild thickening of the aortic valve. Aortic valve regurgitation is mild. Aortic valve sclerosis/calcification is present, without any evidence of aortic stenosis. Aortic valve mean gradient measures 3.3 mmHg. Aortic valve peak gradient measures 5.6 mmHg. Aortic valve area, by VTI measures 3.35 cm.  Pulmonic Valve: The pulmonic valve was normal in structure. Pulmonic valve regurgitation is trivial.  Aorta: The aortic root and ascending aorta are aneurysmal and each measure 4.9cm.  Venous: The inferior vena cava is normal in size with greater than 50% respiratory variability, suggesting right atrial pressure of 3 mmHg.  IAS/Shunts: The atrial septum is grossly normal.   LEFT VENTRICLE PLAX 2D LVIDd:         4.10 cm   Diastology LVIDs:         2.30 cm   LV e' medial:    7.62 cm/s LV PW:         1.40 cm   LV E/e' medial:  16.3 LV IVS:        1.30 cm  LV e' lateral:   12.70 cm/s LVOT diam:     1.90 cm   LV E/e' lateral: 9.8 LV SV:         71 LV SV Index:   37 LVOT Area:     2.84 cm   RIGHT VENTRICLE             IVC RV S prime:     10.90 cm/s  IVC diam: 2.00 cm TAPSE (M-mode): 1.4 cm  LEFT ATRIUM             Index        RIGHT ATRIUM           Index LA diam:        3.80 cm 1.97 cm/m   RA Area:     14.50 cm LA Vol (A2C):   53.5 ml 27.75 ml/m  RA Volume:   38.40 ml  19.92 ml/m LA Vol (A4C):   53.3 ml 27.64 ml/m LA Biplane Vol: 53.9 ml 27.96 ml/m AORTIC VALVE                     PULMONIC VALVE AV Area (Vmax):    3.47 cm      PV Vmax:       0.95 m/s AV Area (Vmean):   3.90 cm      PV Peak grad:  3.6 mmHg AV Area (VTI):     3.35 cm AV Vmax:           118.43 cm/s AV Vmean:          79.267 cm/s AV VTI:            0.210 m AV Peak Grad:      5.6 mmHg AV Mean Grad:      3.3 mmHg LVOT Vmax:         145.00 cm/s LVOT Vmean:        109.000 cm/s LVOT VTI:          0.249 m LVOT/AV VTI  ratio: 1.18  AORTA Ao Root diam: 4.90 cm Ao Asc diam:  4.90 cm  MITRAL VALVE MV Area (PHT): 5.54 cm     SHUNTS MV Decel Time: 137 msec     Systemic VTI:  0.25 m MV E velocity: 124.00 cm/s  Systemic Diam: 1.90 cm MV A velocity: 26.20 cm/s MV E/A ratio:  4.73  Laurance Flatten MD Electronically signed by Laurance Flatten MD Signature Date/Time: 01/12/2023/7:03:36 PM    Final             Risk Assessment/Calculations:    CHA2DS2-VASc Score = 3   This indicates a 3.2% annual risk of stroke. The patient's score is based upon: CHF History: 0 HTN History: 1 Diabetes History: 0 Stroke History: 0 Vascular Disease History: 0 Age Score: 1 Gender Score: 1            Physical Exam:   VS:  BP 126/68 (BP Location: Right Arm, Patient Position: Sitting, Cuff Size: Normal)   Pulse (!) 59   Ht 5' (1.524 m)   Wt 203 lb 12.8 oz (92.4 kg)   SpO2 96%   BMI 39.80 kg/m    Wt Readings from Last 3 Encounters:  05/09/23 203 lb 12.8 oz (92.4 kg)  02/03/23 206 lb (93.4 kg)  01/15/23 206 lb (93.4 kg)    Constitutional:      Appearance: Normal and healthy appearance.  Neck:     Vascular: JVD normal.  Pulmonary:     Effort: Pulmonary effort is normal.     Breath sounds: Normal breath sounds.  Chest:     Chest wall: Not tender to palpatation.  Cardiovascular:     PMI at left midclavicular line. Bradycardia present. Regular rhythm. Normal S1. Normal S2.      Murmurs: There is no murmur.     No gallop.  No click. No rub.  Pulses:    Intact distal pulses.  Edema:    Peripheral edema present.    Thigh: trace edema of the right thigh.    Pretibial: bilateral trace edema of the pretibial area.    Ankle: bilateral trace edema of the ankle.    Feet: trace edema of the right foot. Musculoskeletal: Normal range of motion.     Cervical back: Normal range of motion and neck supple. Skin:    General: Skin is warm and dry.  Neurological:     General: No focal deficit present.     Mental  Status: Alert and oriented to person, place and time.  Psychiatric:        Behavior: Behavior is cooperative.        Assessment and Plan:  Chronic diastolic heart failure -Euvolemic and well compensated on exam -Continue Lasix 20 mg daily with additional 20 mg as needed for edema, fluid retention -Continue Jardiance 10 mg once daily -No BB due to history of bradycardia -Educated to contact our office for weight gain of 2 pounds overnight or 5 pounds in 1 week -Encouraged daily weights, less than 2 g sodium restriction, less than 2 L fluid restriction -Low suspicion BP would tolerate ACE/ARB/ARNI  Hypertension -Well-controlled below goal of less than 130/80 -Continue medication regimen  Paroxysmal atrial fibrillation / Hypercoagulable  -CHA2DS2-VASc Score = 3 [CHF History: 0, HTN History: 1, Diabetes History: 0, Stroke History: 0, Vascular Disease History: 0, Age Score: 1, Gender Score: 1].  Therefore, the patient's annual risk of stroke is 3.2 %.     -No longer candidate for BB therapy due to bradycardia, no C/O tachycardia palpitations -Continue Eliquis 5 mg twice daily, not a candidate for dose reduction -Continue amiodarone 200 mg -Amiodarone monitoring: TSH 7.18, LFTs unremarkable  Ascending aortic aneurysm -5.0 cm ascending aortic aneurysm with evidence of a chronic intramural hematoma along the right lateral ascending aortic wall -Sees Dr. Laneta Simmers, she is not a surgical candidate -Continue adequate blood pressure control  Chronic hypoxic respiratory failure/COPD -On home supplemental O2 -Notes improved DOE, denies SOB at rest  Hypothyroidism -TSH 7.18 on 03/10/2023 -PCP increased Synthroid from 88 mcg to 100 mcg -Continue following with PCP                 Dispo:  Return in about 4 months (around 09/06/2023).  Signed, Denyce Robert, NP

## 2023-05-09 ENCOUNTER — Ambulatory Visit (HOSPITAL_BASED_OUTPATIENT_CLINIC_OR_DEPARTMENT_OTHER): Payer: 59 | Admitting: Emergency Medicine

## 2023-05-09 ENCOUNTER — Encounter (HOSPITAL_BASED_OUTPATIENT_CLINIC_OR_DEPARTMENT_OTHER): Payer: Self-pay | Admitting: Emergency Medicine

## 2023-05-09 VITALS — BP 126/68 | HR 59 | Ht 60.0 in | Wt 203.8 lb

## 2023-05-09 DIAGNOSIS — I480 Paroxysmal atrial fibrillation: Secondary | ICD-10-CM | POA: Diagnosis not present

## 2023-05-09 DIAGNOSIS — Z79899 Other long term (current) drug therapy: Secondary | ICD-10-CM | POA: Diagnosis not present

## 2023-05-09 DIAGNOSIS — I7121 Aneurysm of the ascending aorta, without rupture: Secondary | ICD-10-CM

## 2023-05-09 DIAGNOSIS — I5032 Chronic diastolic (congestive) heart failure: Secondary | ICD-10-CM | POA: Diagnosis not present

## 2023-05-09 DIAGNOSIS — E039 Hypothyroidism, unspecified: Secondary | ICD-10-CM

## 2023-05-09 DIAGNOSIS — I10 Essential (primary) hypertension: Secondary | ICD-10-CM | POA: Diagnosis not present

## 2023-05-09 NOTE — Patient Instructions (Signed)
Medication Instructions:  Continue your current medications.   *If you need a refill on your cardiac medications before your next appointment, please call your pharmacy*  Follow-Up: At [facility], you and your health needs are our priority.  As part of our continuing mission to provide you with exceptional heart care, we have created designated Provider Care Teams.  These Care Teams include your primary Cardiologist (physician) and Advanced Practice Providers (APPs -  Physician Assistants and Nurse Practitioners) who all work together to provide you with the care you need, when you need it.  We recommend signing up for the patient portal called "MyChart".  Sign up information is provided on this After Visit Summary.  MyChart is used to connect with patients for Virtual Visits (Telemedicine).  Patients are able to view lab/test results, encounter notes, upcoming appointments, etc.  Non-urgent messages can be sent to your provider as well.   To learn more about what you can do with MyChart, go to [email or web address].    Your next appointment:   4 month(s)  Provider:   Jodelle Red, MD or Gillian Shields, NP    Other Instructions  Recommend weighing daily and keeping a log. Please call our office if you have weight gain of 2 pounds overnight or 5 pounds in 1 week.

## 2023-06-14 ENCOUNTER — Other Ambulatory Visit: Payer: Self-pay | Admitting: Surgery

## 2023-06-14 DIAGNOSIS — I7101 Dissection of ascending aorta: Secondary | ICD-10-CM

## 2023-06-21 ENCOUNTER — Ambulatory Visit (HOSPITAL_COMMUNITY)
Admission: RE | Admit: 2023-06-21 | Discharge: 2023-06-21 | Disposition: A | Discharge status: Home / Self Care | Payer: 59 | Source: Ambulatory Visit | Attending: Surgery | Admitting: Surgery

## 2023-06-21 DIAGNOSIS — I7101 Dissection of ascending aorta: Secondary | ICD-10-CM | POA: Diagnosis present

## 2023-06-21 MED ORDER — IOHEXOL 350 MG/ML SOLN
70.0000 mL | Freq: Once | INTRAVENOUS | Status: AC | PRN
  Administered 2023-06-21: 70 mL via INTRAVENOUS

## 2023-06-23 LAB — POCT I-STAT CREATININE: Creatinine, Ser: 1.5 mg/dL — ABNORMAL HIGH (ref 0.44–1.00)

## 2023-07-26 ENCOUNTER — Ambulatory Visit (INDEPENDENT_AMBULATORY_CARE_PROVIDER_SITE_OTHER): Payer: 59 | Admitting: Surgery

## 2023-07-26 ENCOUNTER — Encounter: Payer: Self-pay | Admitting: Surgery

## 2023-07-26 VITALS — BP 173/83 | HR 65 | Resp 18 | Wt 202.0 lb

## 2023-07-26 DIAGNOSIS — I712 Thoracic aortic aneurysm, without rupture, unspecified: Secondary | ICD-10-CM

## 2023-07-26 NOTE — Progress Notes (Signed)
HPI:  The patient is a 68 year old woman with a history of hypertension, hyperlipidemia, hypothyroidism, depression, super morbid obesity status post gastric sleeve resection with a current BMI of 41 kg/m, severe degenerative arthritis status post bilateral knee replacements and right hip replacement, glaucoma with blindness in the left eye and limited vision in the right eye who presented on 11/13/2022 after awakening with sudden onset of sharp central chest pain radiating to her back.  CTA of the chest showed either an acute type A aortic dissection with thrombosis of the false lumen versus an intramural hematoma extending from the aortic root through the aortic arch and into the descending thoracic aorta.  The aortic root was measured at about 5 cm.  I did not feel that she was an operative candidate at that time due to her comorbid risk factors and she was admitted to the intensive care unit by CCM for blood pressure control.  She recovered and went home.  I last saw her on 01/04/2023 at which time she was doing relatively well but with multiple comorbidities we decided to continue following her aorta.  She was admitted in the middle of July 2024 with heart failure exacerbation responded to diuresis.  She was also in atrial fibrillation which was controlled with metoprolol and amiodarone.  She is now on Eliquis.  She is also on oxygen at home.  Current Outpatient Medications  Medication Sig Dispense Refill   acetaminophen-codeine (TYLENOL #4) 300-60 MG tablet Take 1 tablet by mouth every 8 (eight) hours as needed for moderate pain (pain score 4-6).     amiodarone (PACERONE) 200 MG tablet Take 1 tablet (200 mg total) by mouth daily. 90 tablet 1   apixaban (ELIQUIS) 5 MG TABS tablet Take 1 tablet (5 mg total) by mouth 2 (two) times daily. 180 tablet 1   B Complex-C (B-COMPLEX WITH VITAMIN C) tablet Take 1 tablet by mouth daily.     CALCIUM CITRATE PO Take 600 mg by mouth daily.     Cyanocobalamin  (VITAMIN B-12) 2500 MCG SUBL Place 2,500 mcg under the tongue daily.     empagliflozin (JARDIANCE) 10 MG TABS tablet Take 10 mg by mouth daily.     FLUoxetine (PROZAC) 40 MG capsule Take 40 mg by mouth daily.     furosemide (LASIX) 20 MG tablet Take one tablet daily. May take additional tablet as needed for swelling, fluid retention. 135 tablet 1   latanoprost (XALATAN) 0.005 % ophthalmic solution Place 1 drop into the right eye at bedtime.      levothyroxine (SYNTHROID) 100 MCG tablet Take 100 mcg by mouth daily before breakfast.     magnesium oxide (MAG-OX) 400 (240 Mg) MG tablet Take 400 mg by mouth 2 (two) times daily.     NON FORMULARY Cranberry conascorib acid 300-100mg      Omega 3-6-9 Fatty Acids (TRIPLE OMEGA-3-6-9 PO) Take 400 mg by mouth daily.     rosuvastatin (CRESTOR) 10 MG tablet Take 20 mg by mouth daily.     Skin Protectants, Misc. (MINERIN CREME EX) Apply 1 application  topically daily.     traZODone (DESYREL) 50 MG tablet Take 50 mg by mouth at bedtime.     No current facility-administered medications for this visit.     Physical Exam: BP (!) 173/83   Pulse 65   Resp 18   Wt 202 lb (91.6 kg)   BMI 39.45 kg/m  She is a chronically ill-appearing woman in no distress.  She  is here today with a friend of hers. Cardiac exam shows regular rate and rhythm with normal heart sounds.  There is no murmur. Lungs are clear. There is no peripheral edema.  Diagnostic Tests:  Narrative & Impression  CLINICAL DATA:  Acute intramural hematoma follow-up   EXAM: CT ANGIOGRAPHY CHEST WITH CONTRAST   TECHNIQUE: Multidetector CT imaging of the chest was performed using the standard protocol during bolus administration of intravenous contrast. Multiplanar CT image reconstructions and MIPs were obtained to evaluate the vascular anatomy.   RADIATION DOSE REDUCTION: This exam was performed according to the departmental dose-optimization program which includes automated exposure  control, adjustment of the mA and/or kV according to patient size and/or use of iterative reconstruction technique.   CONTRAST:  70mL OMNIPAQUE IOHEXOL 350 MG/ML SOLN   COMPARISON:  Prior CT scans of the chest 01/04/2023; 11/18/2022   FINDINGS: Cardiovascular: Significant interval improvement in the appearance of the acute intramural hematoma compared to prior imaging. The intramural hematoma has essentially completely resolved, however there is a persistent contained perforation of the proximal ascending thoracic aorta arising at the 6 o'clock position and measuring 1.6 x 0.7 x 1.6 cm. The aortic root is mildly dilated at 4.1 cm. No evidence of aneurysm. Two vessel aortic arch. The right brachiocephalic and left common carotid artery share a common origin. Extreme tortuosity of the arch vessels. Scattered atherosclerotic calcifications. Atherosclerotic calcifications present along the coronary arteries. The heart is enlarged. No pericardial effusion.   Mediastinum/Nodes: No enlarged mediastinal, hilar, or axillary lymph nodes. Thyroid gland, trachea, and esophagus demonstrate no significant findings.   Lungs/Pleura: Diffuse mild bronchial wall thickening. Scattered foci of patchy airspace opacity throughout the lungs is nonspecific and likely reflects multifocal atelectasis. No pleural effusion or pneumothorax.   Upper Abdomen: Surgical changes of prior gastric sleeve operation. Small probable benign cyst in the liver. No acute abnormality.   Musculoskeletal: No acute fracture or aggressive appearing lytic or blastic osseous lesion.   Review of the MIP images confirms the above findings.   IMPRESSION: 1. Significant interval improvement with near-total interval resolution of the previously identified intramural hematoma with resolved wall thickening along the ascending, transverse and proximal descending thoracic aorta. 2. However, there is a persistent contained  penetrating atherosclerotic ulceration/pseudoaneurysm arising from the proximal ascending thoracic aorta at the 6 o'clock position. The small contained pseudoaneurysm measures 1.6 x 0.7 x 1.6 cm. 3. Aortic and coronary artery atherosclerotic vascular calcifications. 4. Diffuse mild bronchial wall thickening. 5. Stable cardiomegaly.   Aortic Atherosclerosis (ICD10-I70.0).     Electronically Signed   By: Malachy Moan M.D.   On: 06/24/2023 14:16      Impression:  She has had significant interval improvement in the previously seen intramural hematoma.  It is almost completely resolved.  There is a persistent outpouching from the mid ascending aorta at the 6 o'clock position it looks like it could be a small penetrating ulcer or pseudoaneurysm.  It measures 1.6 x 1.6 cm and is 0.7 cm in depth.  It is stable compared to her previous scans.  I suspect this is probably where the initial tear occurred leading to the aortic dissection/intramural hematoma.  I do not think she is a candidate for replacement of her ascending aorta due to her multiple comorbidities.  I reviewed the CT images with her and answered all of her questions.  I stressed the importance of continued good blood pressure control in preventing further enlargement and acute aortic dissection.   Plan:  She will return to see me in 1 year with a CTA of the chest.  I spent 15 minutes performing this established patient evaluation and > 50% of this time was spent face to face counseling and coordinating the care of this patient's aortic aneurysm.    Alleen Borne, MD [facility] [phone]

## 2023-08-01 ENCOUNTER — Other Ambulatory Visit: Payer: Self-pay | Admitting: Family Medicine

## 2023-08-01 DIAGNOSIS — Z1231 Encounter for screening mammogram for malignant neoplasm of breast: Secondary | ICD-10-CM

## 2023-08-08 ENCOUNTER — Other Ambulatory Visit (HOSPITAL_BASED_OUTPATIENT_CLINIC_OR_DEPARTMENT_OTHER): Payer: Self-pay | Admitting: Family

## 2023-08-08 DIAGNOSIS — D6859 Other primary thrombophilia: Secondary | ICD-10-CM

## 2023-08-08 DIAGNOSIS — I480 Paroxysmal atrial fibrillation: Secondary | ICD-10-CM

## 2023-08-08 NOTE — Telephone Encounter (Signed)
Eliquis 5mg  refill request received. Patient is 68 years old, weight-91.6kg, Crea-1.50 on 06/21/23, Diagnosis-Afib, and last seen by Rise Paganini on 05/09/23. Dose is appropriate based on dosing criteria. Will send in refill to requested pharmacy.

## 2023-09-13 NOTE — Progress Notes (Addendum)
 Cardiology Office Note    Date:  09/16/2023  ID:  Lauren Clark, Lauren Clark 1956/02/09, MRN [id number] PCP:  Ellyn Hack, MD  Cardiologist:  Jodelle Red, MD  Electrophysiologist:  None   Chief Complaint: Follow up for chronic diastolic HF   History of Present Illness: .    Lauren Clark is a 68 y.o. female with visit-pertinent history of atrial fibrillation, chronic diastolic heart failure, hypertension, hyperlipidemia, ascending aorta and aortic root aneurysm, hypothyroidism, morbid obesity s/p gastric sleeve resection, DVT.  Patient previously admitted from 5/11 through 11/19/2022 with dissection of the ascending order with evidence of chronic intramural hematoma along right lateral ascending aortic wall.  She was not a candidate for surgical resection and recommended to remain off OAC.  Initial plan was for palliative care follow-up.  In 01/2023 she was admitted with acute on chronic diastolic heart failure.  Echo on 01/12/2023 indicated LVEF of 60 to 65%, no LVH, RV systolic function preserved, no significant valvular disease, ascending aortic aneurysm 4.9 cm.  Treated with IV Lasix and discharged on as needed Lasix.  No ACE/ARB/Arni or SGLT2 due to renal function.  She was discharged on 2 L nasal cannula and Eliquis was resumed prior to discharge.  Patient was seen in clinic on 02/03/2023, she was referred to palliative care through [facility] supportive care given recent hospitalizations and nonoperable a ascending aortic aneurysm.  Patient was last in clinic on 05/09/2023, she remained stable from cardiac standpoint.  Today she presents for follow-up.  She reports that she is doing very well.  She denies chest pain, lower extremity edema, orthopnea or PND.  She denies any palpitations or feeling of increased heart rates.  She reports that she has had decreased oxygen requirements, when she is out of her house she does wear 2 L nasal cannula but when at home she is not requiring oxygen.  She  reports that her breathing is very stable.  ROS: .   Today she denies chest pain, lower extremity edema, fatigue, palpitations, melena, hematuria, hemoptysis, diaphoresis, weakness, presyncope, syncope, orthopnea, and PND.  All other systems are reviewed and otherwise negative. Studies Reviewed: Marland Kitchen   EKG:  EKG is ordered today, personally reviewed, demonstrating  EKG Interpretation Date/Time:  Friday September 15 2023 10:49:01 EDT Ventricular Rate:  57 PR Interval:  210 QRS Duration:  82 QT Interval:  440 QTC Calculation: 428 R Axis:   104  Text Interpretation: Sinus bradycardia with 1st degree A-V block Rightward axis Confirmed by Reather Littler [id number]) on 09/15/2023 1:09:23 PM   CV Studies: Cardiac studies reviewed are outlined and summarized above. Otherwise please see EMR for full report. Cardiac Studies & Procedures   ______________________________________________________________________________________________     ECHOCARDIOGRAM  ECHOCARDIOGRAM COMPLETE 01/12/2023  Narrative ECHOCARDIOGRAM REPORT    Patient Name:   Lauren Clark Date of Exam: 01/12/2023 Medical Rec #:  [id number]    Height:       60.0 in Accession #:    [id number]   Weight:       215.8 lb Date of Birth:  27-Apr-1956    BSA:          1.928 m Patient Age:    67 years     BP:           113/76 mmHg Patient Gender: F            HR:           83 bpm. Exam Location:  Inpatient  Procedure: 2D Echo, Cardiac Doppler and Color Doppler  Indications:    R06.9 DOE  History:        Patient has prior history of Echocardiogram examinations, most recent 11/14/2022. CHF, Signs/Symptoms:Shortness of Breath and Dyspnea; Risk Factors:Hypertension, Dyslipidemia and Non-Smoker.  Sonographer:    Dondra Prader RVT RCS Referring Phys: [id number] SHENG L HALEY   Sonographer Comments: Suboptimal parasternal window. Image acquisition challenging due to respiratory motion and Image acquisition challenging due to patient body  habitus. IMPRESSIONS   1. The aortic valve is tricuspid. There is mild calcification of the aortic valve. There is mild thickening of the aortic valve. Aortic valve regurgitation is mild. Aortic valve sclerosis/calcification is present, without any evidence of aortic stenosis. 2. Left ventricular ejection fraction, by estimation, is 60 to 65%. The left ventricle has normal function. The left ventricle has no regional wall motion abnormalities. Left ventricular diastolic parameters are indeterminate. 3. The mitral valve is normal in structure. Trivial mitral valve regurgitation. No evidence of mitral stenosis. 4. Right ventricular systolic function is normal. The right ventricular size is normal. 5. The ascending aorta and aortic root are aneurysmal and each measure 4.9cm. Findings consistent with CTA. 6. The inferior vena cava is normal in size with greater than 50% respiratory variability, suggesting right atrial pressure of 3 mmHg.  Comparison(s): No significant change from prior study.  FINDINGS Left Ventricle: Left ventricular ejection fraction, by estimation, is 60 to 65%. The left ventricle has normal function. The left ventricle has no regional wall motion abnormalities. The left ventricular internal cavity size was normal in size. There is no left ventricular hypertrophy. Left ventricular diastolic parameters are indeterminate.  Right Ventricle: The right ventricular size is normal. No increase in right ventricular wall thickness. Right ventricular systolic function is normal.  Left Atrium: Left atrial size was normal in size.  Right Atrium: Right atrial size was normal in size.  Pericardium: There is no evidence of pericardial effusion.  Mitral Valve: The mitral valve is normal in structure. Trivial mitral valve regurgitation. No evidence of mitral valve stenosis.  Tricuspid Valve: The tricuspid valve is normal in structure. Tricuspid valve regurgitation is trivial.  Aortic  Valve: The aortic valve is tricuspid. There is mild calcification of the aortic valve. There is mild thickening of the aortic valve. Aortic valve regurgitation is mild. Aortic valve sclerosis/calcification is present, without any evidence of aortic stenosis. Aortic valve mean gradient measures 3.3 mmHg. Aortic valve peak gradient measures 5.6 mmHg. Aortic valve area, by VTI measures 3.35 cm.  Pulmonic Valve: The pulmonic valve was normal in structure. Pulmonic valve regurgitation is trivial.  Aorta: The aortic root and ascending aorta are aneurysmal and each measure 4.9cm.  Venous: The inferior vena cava is normal in size with greater than 50% respiratory variability, suggesting right atrial pressure of 3 mmHg.  IAS/Shunts: The atrial septum is grossly normal.   LEFT VENTRICLE PLAX 2D LVIDd:         4.10 cm   Diastology LVIDs:         2.30 cm   LV e' medial:    7.62 cm/s LV PW:         1.40 cm   LV E/e' medial:  16.3 LV IVS:        1.30 cm   LV e' lateral:   12.70 cm/s LVOT diam:     1.90 cm   LV E/e' lateral: 9.8 LV SV:  71 LV SV Index:   37 LVOT Area:     2.84 cm   RIGHT VENTRICLE             IVC RV S prime:     10.90 cm/s  IVC diam: 2.00 cm TAPSE (M-mode): 1.4 cm  LEFT ATRIUM             Index        RIGHT ATRIUM           Index LA diam:        3.80 cm 1.97 cm/m   RA Area:     14.50 cm LA Vol (A2C):   53.5 ml 27.75 ml/m  RA Volume:   38.40 ml  19.92 ml/m LA Vol (A4C):   53.3 ml 27.64 ml/m LA Biplane Vol: 53.9 ml 27.96 ml/m AORTIC VALVE                     PULMONIC VALVE AV Area (Vmax):    3.47 cm      PV Vmax:       0.95 m/s AV Area (Vmean):   3.90 cm      PV Peak grad:  3.6 mmHg AV Area (VTI):     3.35 cm AV Vmax:           118.43 cm/s AV Vmean:          79.267 cm/s AV VTI:            0.210 m AV Peak Grad:      5.6 mmHg AV Mean Grad:      3.3 mmHg LVOT Vmax:         145.00 cm/s LVOT Vmean:        109.000 cm/s LVOT VTI:          0.249 m LVOT/AV VTI  ratio: 1.18  AORTA Ao Root diam: 4.90 cm Ao Asc diam:  4.90 cm  MITRAL VALVE MV Area (PHT): 5.54 cm     SHUNTS MV Decel Time: 137 msec     Systemic VTI:  0.25 m MV E velocity: 124.00 cm/s  Systemic Diam: 1.90 cm MV A velocity: 26.20 cm/s MV E/A ratio:  4.73  Laurance Flatten MD Electronically signed by Laurance Flatten MD Signature Date/Time: 01/12/2023/7:03:36 PM    Final          ______________________________________________________________________________________________       Current Reported Medications:.    Current Meds  Medication Sig   acetaminophen-codeine (TYLENOL #4) 300-60 MG tablet Take 1 tablet by mouth every 8 (eight) hours as needed for moderate pain (pain score 4-6).   amiodarone (PACERONE) 200 MG tablet Take 1 tablet (200 mg total) by mouth daily.   B Complex-C (B-COMPLEX WITH VITAMIN C) tablet Take 1 tablet by mouth daily.   CALCIUM CITRATE PO Take 600 mg by mouth daily.   Cyanocobalamin (VITAMIN B-12) 2500 MCG SUBL Place 2,500 mcg under the tongue daily.   ELIQUIS 5 MG TABS tablet TAKE 1 TABLET(5 MG) BY MOUTH TWICE DAILY   empagliflozin (JARDIANCE) 10 MG TABS tablet Take 10 mg by mouth daily.   FLUoxetine (PROZAC) 40 MG capsule Take 40 mg by mouth daily.   furosemide (LASIX) 20 MG tablet Take one tablet daily. May take additional tablet as needed for swelling, fluid retention.   latanoprost (XALATAN) 0.005 % ophthalmic solution Place 1 drop into the right eye at bedtime.    levothyroxine (SYNTHROID) 112 MCG tablet Take 112 mcg by  mouth every morning.   magnesium oxide (MAG-OX) 400 (240 Mg) MG tablet Take 400 mg by mouth 2 (two) times daily.   NON FORMULARY Cranberry conascorib acid 300-100mg    Omega 3-6-9 Fatty Acids (TRIPLE OMEGA-3-6-9 PO) Take 400 mg by mouth daily.   rosuvastatin (CRESTOR) 40 MG tablet Take 40 mg by mouth at bedtime.   Skin Protectants, Misc. (MINERIN CREME EX) Apply 1 application  topically daily.   traZODone (DESYREL) 50 MG  tablet Take 50 mg by mouth at bedtime.   [DISCONTINUED] levothyroxine (SYNTHROID) 100 MCG tablet Take 100 mcg by mouth daily before breakfast.   [DISCONTINUED] rosuvastatin (CRESTOR) 10 MG tablet Take 20 mg by mouth daily.    Physical Exam:    VS:  BP 112/64   Pulse 61   Ht 5' (1.524 m)   Wt 209 lb 6.4 oz (95 kg)   SpO2 95%   BMI 40.90 kg/m    Wt Readings from Last 3 Encounters:  09/15/23 209 lb 6.4 oz (95 kg)  07/26/23 202 lb (91.6 kg)  05/09/23 203 lb 12.8 oz (92.4 kg)    GEN: Well nourished, well developed in no acute distress NECK: No JVD; No carotid bruits CARDIAC: RRR, no murmurs, rubs, gallops RESPIRATORY:  Clear to auscultation without rales, wheezing or rhonchi  ABDOMEN: Soft, non-tender, non-distended EXTREMITIES:  No edema; No acute deformity     Asessement and Plan:.    Chronic diastolic HF:  Patient appears euvolemic and well compensated on exam today. Continue Lasix 20 mg daily with additional 20 mg as needed for lower extremity edema or fluid retention.  Continue Jardiance 10 mg once daily.  Patient is no longer on beta-blocker due to history of bradycardia. Discussed with patient she should contact our office or weight gain of 2 to 3 pounds overnight or 5 pounds in 1 week.  Encouraged patient to monitor her daily weights.  Patient reports that her PCP recently checked all of her lab work earlier this month, we will request from PCPs office.  HTN: Blood pressure today 112/64. Continue current antihypertensive regimen  PAF/Hypercoagulable: Patient is no longer on beta-blocker due to bradycardia. Patient denies any palpitations or feeling of increased heart rates.  She denies any bleeding problems on Eliquis. Continue Eliquis 5 mg twice daily and amiodarone 200 mg daily.  Ascending aortic aneurysm: 5 cm with a chronic intramural hematoma along the right lateral ascending aortic wall. Follows with Dr. Laneta Simmers.  Patient was last seen by Dr. Laneta Simmers on 07/26/2023, noted to  have significant interval improvement in previously seen intramural hematoma, noted to have almost completely resolved.  She will follow-up with Dr. Laneta Simmers in 1 year with a CTA of the chest.  Chronic hypoxic respiratory failure/COPD: On home supplemental oxygen.  She is currently on 2L [place] when going out, not requiring at home.   Hypothyroidism: Patient reports that her levothyroxine was recently increased again.  This monitored and managed per her PCP.  Addendum 09/25/23: Patient requested transfer to Dr. Rennis Golden, discussed with Dr. Rennis Golden and Dr. Cristal Deer, both providers agreeable to transfer.     Disposition: F/u with Reather Littler, NP in four months.   Signed, Rip Harbour, NP

## 2023-09-15 ENCOUNTER — Ambulatory Visit: Payer: 59 | Attending: Cardiology | Admitting: Cardiology

## 2023-09-15 ENCOUNTER — Ambulatory Visit (HOSPITAL_BASED_OUTPATIENT_CLINIC_OR_DEPARTMENT_OTHER): Payer: 59 | Admitting: Cardiology

## 2023-09-15 VITALS — BP 112/64 | HR 61 | Ht 60.0 in | Wt 209.4 lb

## 2023-09-15 DIAGNOSIS — I480 Paroxysmal atrial fibrillation: Secondary | ICD-10-CM | POA: Diagnosis not present

## 2023-09-15 DIAGNOSIS — I5032 Chronic diastolic (congestive) heart failure: Secondary | ICD-10-CM | POA: Diagnosis not present

## 2023-09-15 DIAGNOSIS — Z79899 Other long term (current) drug therapy: Secondary | ICD-10-CM | POA: Diagnosis not present

## 2023-09-15 DIAGNOSIS — E039 Hypothyroidism, unspecified: Secondary | ICD-10-CM

## 2023-09-15 DIAGNOSIS — I10 Essential (primary) hypertension: Secondary | ICD-10-CM | POA: Diagnosis not present

## 2023-09-15 DIAGNOSIS — I7121 Aneurysm of the ascending aorta, without rupture: Secondary | ICD-10-CM

## 2023-09-15 NOTE — Patient Instructions (Signed)
 Medication Instructions:  No changes *If you need a refill on your cardiac medications before your next appointment, please call your pharmacy*  Lab Work: No labs  Testing/Procedures: No testing  Follow-Up: At [facility], you and your health needs are our priority.  As part of our continuing mission to provide you with exceptional heart care, we have created designated Provider Care Teams.  These Care Teams include your primary Cardiologist (physician) and Advanced Practice Providers (APPs -  Physician Assistants and Nurse Practitioners) who all work together to provide you with the care you need, when you need it.  We recommend signing up for the patient portal called "MyChart".  Sign up information is provided on this After Visit Summary.  MyChart is used to connect with patients for Virtual Visits (Telemedicine).  Patients are able to view lab/test results, encounter notes, upcoming appointments, etc.  Non-urgent messages can be sent to your provider as well.   To learn more about what you can do with MyChart, go to [email or web address].    Your next appointment:   4 month(s)  Provider:   Reather Littler, NP      Other Instructions

## 2023-09-16 ENCOUNTER — Encounter: Payer: Self-pay | Admitting: Cardiology

## 2023-09-18 NOTE — Progress Notes (Signed)
 Order(s) created erroneously. Erroneous order ID: [id number]  Order moved by: Luther Redo  Order move date/time: 09/18/2023 7:50 AM  Source Patient: [id number]  Source Contact: 09/15/2023  Destination Patient: [id number]  Destination Contact: 12/14/2022

## 2023-09-21 ENCOUNTER — Ambulatory Visit
Admission: RE | Admit: 2023-09-21 | Discharge: 2023-09-21 | Disposition: A | Discharge status: Home / Self Care | Payer: 59 | Source: Ambulatory Visit | Attending: Family Medicine | Admitting: Family Medicine

## 2023-09-21 DIAGNOSIS — Z1231 Encounter for screening mammogram for malignant neoplasm of breast: Secondary | ICD-10-CM

## 2023-09-25 ENCOUNTER — Other Ambulatory Visit: Payer: Self-pay | Admitting: Family Medicine

## 2023-09-25 DIAGNOSIS — R928 Other abnormal and inconclusive findings on diagnostic imaging of breast: Secondary | ICD-10-CM

## 2023-10-04 ENCOUNTER — Other Ambulatory Visit (HOSPITAL_BASED_OUTPATIENT_CLINIC_OR_DEPARTMENT_OTHER): Payer: Self-pay | Admitting: Family

## 2023-10-04 DIAGNOSIS — I5032 Chronic diastolic (congestive) heart failure: Secondary | ICD-10-CM

## 2023-10-12 ENCOUNTER — Ambulatory Visit
Admission: RE | Admit: 2023-10-12 | Discharge: 2023-10-12 | Disposition: A | Discharge status: Home / Self Care | Source: Ambulatory Visit | Attending: Family Medicine | Admitting: Family Medicine

## 2023-10-12 ENCOUNTER — Other Ambulatory Visit: Payer: Self-pay | Admitting: Family Medicine

## 2023-10-12 DIAGNOSIS — R928 Other abnormal and inconclusive findings on diagnostic imaging of breast: Secondary | ICD-10-CM

## 2023-10-12 DIAGNOSIS — R921 Mammographic calcification found on diagnostic imaging of breast: Secondary | ICD-10-CM

## 2023-10-18 ENCOUNTER — Ambulatory Visit
Admission: RE | Admit: 2023-10-18 | Discharge: 2023-10-18 | Disposition: A | Discharge status: Home / Self Care | Source: Ambulatory Visit | Attending: Family Medicine | Admitting: Family Medicine

## 2023-10-18 DIAGNOSIS — R921 Mammographic calcification found on diagnostic imaging of breast: Secondary | ICD-10-CM

## 2023-10-18 DIAGNOSIS — R928 Other abnormal and inconclusive findings on diagnostic imaging of breast: Secondary | ICD-10-CM

## 2023-10-18 HISTORY — PX: BREAST BIOPSY: SHX20

## 2023-10-19 LAB — SURGICAL PATHOLOGY

## 2023-12-15 ENCOUNTER — Other Ambulatory Visit (HOSPITAL_BASED_OUTPATIENT_CLINIC_OR_DEPARTMENT_OTHER): Payer: Self-pay | Admitting: Family

## 2023-12-15 DIAGNOSIS — D6859 Other primary thrombophilia: Secondary | ICD-10-CM

## 2023-12-15 DIAGNOSIS — I480 Paroxysmal atrial fibrillation: Secondary | ICD-10-CM

## 2023-12-15 DIAGNOSIS — Z79899 Other long term (current) drug therapy: Secondary | ICD-10-CM

## 2023-12-30 NOTE — Progress Notes (Unsigned)
 Cardiology Office Note    Date:  01/01/2024  ID:  Lauren, Clark [birth year], MRN [id number] PCP:  [name], MD  Cardiologist:  Vinie JAYSON Maxcy, MD  Electrophysiologist:  None   Chief Complaint: Follow up for PAF and HFpEF   History of Present Illness: .    Lauren Clark is a 68 y.o. female with visit-pertinent history of atrial fibrillation, chronic diastolic heart failure, hypertension, hyperlipidemia, ascending aorta and aortic root aneurysm, hypothyroidism, morbid obesity s/p gastric sleeve resection, DVT.  Patient previously admitted from 5/11 through 11/19/2022 with dissection of the ascending order with evidence of chronic intramural hematoma along right lateral ascending aortic wall.  She was not a candidate for surgical resection and recommended to remain off OAC.  Initial plan was for palliative care follow-up.  In 01/2023 she was admitted with acute on chronic diastolic heart failure.  Echo on 01/12/2023 indicated LVEF of 60 to 65%, no LVH, RV systolic function preserved, no significant valvular disease, ascending aortic aneurysm 4.9 cm.  Treated with IV Lasix  and discharged on as needed Lasix .  No ACE/ARB/Arni or SGLT2 due to renal function.  She was discharged on 2 L nasal cannula and Eliquis  was resumed prior to discharge.  Patient was seen in clinic on 02/03/2023, she was referred to palliative care through [facility] supportive care given recent hospitalizations and nonoperable a ascending aortic aneurysm. On follow up on 05/09/2023, she remained stable from cardiac standpoint.  Patient was last seen in clinic on 09/15/2023 for follow-up.  She remains stable from a cardiac standpoint.  Patient reported that she had decreased oxygen requirements and when out of the house was wearing 2 L nasal cannula but when at home was not requiring oxygen.  Today she presents for follow-up.  She reports that she has been doing well overall.  She denies any significant chest pain or changes in  her breathing, she remains on 2 L nasal cannula however when at home she typically does not require oxygen.  She denies increased lower extremity edema, orthopnea or PND.  She does have a weight gain today of 8 pounds since last office visit, she is unsure if this is fluid related or with having increased food intake and living a sedentary lifestyle.  She denies any cardiac complaints or concerns today, she is able to achieve greater than 4 METS of activity with walking and household chores.  ROS: .   Today she denies chest pain, palpitations, melena, hematuria, hemoptysis, diaphoresis, weakness, presyncope, syncope, orthopnea, and PND.  All other systems are reviewed and otherwise negative. Studies Reviewed: [name]   EKG:  EKG is not ordered today.  CV Studies: Cardiac studies reviewed are outlined and summarized above. Otherwise please see EMR for full report. Cardiac Studies & Procedures   ______________________________________________________________________________________________     ECHOCARDIOGRAM  ECHOCARDIOGRAM COMPLETE 01/12/2023  Narrative ECHOCARDIOGRAM REPORT    Patient Name:   Lauren Clark Date of Exam: 01/12/2023 Medical Rec #:  [id number]    Height:       60.0 in Accession #:    [id number]   Weight:       215.8 lb Date of Birth:  May 02, 1956    BSA:          1.928 m Patient Age:    67 years     BP:           113/76 mmHg Patient Gender: F  HR:           83 bpm. Exam Location:  Inpatient  Procedure: 2D Echo, Cardiac Doppler and Color Doppler  Indications:    R06.9 DOE  History:        Patient has prior history of Echocardiogram examinations, most recent 11/14/2022. CHF, Signs/Symptoms:Shortness of Breath and Dyspnea; Risk Factors:Hypertension, Dyslipidemia and Non-Smoker.  Sonographer:    Tillman Nora RVT RCS Referring Phys: [id number] SHENG L HALEY   Sonographer Comments: Suboptimal parasternal window. Image acquisition challenging due to respiratory motion and  Image acquisition challenging due to patient body habitus. IMPRESSIONS   1. The aortic valve is tricuspid. There is mild calcification of the aortic valve. There is mild thickening of the aortic valve. Aortic valve regurgitation is mild. Aortic valve sclerosis/calcification is present, without any evidence of aortic stenosis. 2. Left ventricular ejection fraction, by estimation, is 60 to 65%. The left ventricle has normal function. The left ventricle has no regional wall motion abnormalities. Left ventricular diastolic parameters are indeterminate. 3. The mitral valve is normal in structure. Trivial mitral valve regurgitation. No evidence of mitral stenosis. 4. Right ventricular systolic function is normal. The right ventricular size is normal. 5. The ascending aorta and aortic root are aneurysmal and each measure 4.9cm. Findings consistent with CTA. 6. The inferior vena cava is normal in size with greater than 50% respiratory variability, suggesting right atrial pressure of 3 mmHg.  Comparison(s): No significant change from prior study.  FINDINGS Left Ventricle: Left ventricular ejection fraction, by estimation, is 60 to 65%. The left ventricle has normal function. The left ventricle has no regional wall motion abnormalities. The left ventricular internal cavity size was normal in size. There is no left ventricular hypertrophy. Left ventricular diastolic parameters are indeterminate.  Right Ventricle: The right ventricular size is normal. No increase in right ventricular wall thickness. Right ventricular systolic function is normal.  Left Atrium: Left atrial size was normal in size.  Right Atrium: Right atrial size was normal in size.  Pericardium: There is no evidence of pericardial effusion.  Mitral Valve: The mitral valve is normal in structure. Trivial mitral valve regurgitation. No evidence of mitral valve stenosis.  Tricuspid Valve: The tricuspid valve is normal in structure.  Tricuspid valve regurgitation is trivial.  Aortic Valve: The aortic valve is tricuspid. There is mild calcification of the aortic valve. There is mild thickening of the aortic valve. Aortic valve regurgitation is mild. Aortic valve sclerosis/calcification is present, without any evidence of aortic stenosis. Aortic valve mean gradient measures 3.3 mmHg. Aortic valve peak gradient measures 5.6 mmHg. Aortic valve area, by VTI measures 3.35 cm.  Pulmonic Valve: The pulmonic valve was normal in structure. Pulmonic valve regurgitation is trivial.  Aorta: The aortic root and ascending aorta are aneurysmal and each measure 4.9cm.  Venous: The inferior vena cava is normal in size with greater than 50% respiratory variability, suggesting right atrial pressure of 3 mmHg.  IAS/Shunts: The atrial septum is grossly normal.   LEFT VENTRICLE PLAX 2D LVIDd:         4.10 cm   Diastology LVIDs:         2.30 cm   LV e' medial:    7.62 cm/s LV PW:         1.40 cm   LV E/e' medial:  16.3 LV IVS:        1.30 cm   LV e' lateral:   12.70 cm/s LVOT diam:     1.90 cm  LV E/e' lateral: 9.8 LV SV:         71 LV SV Index:   37 LVOT Area:     2.84 cm   RIGHT VENTRICLE             IVC RV S prime:     10.90 cm/s  IVC diam: 2.00 cm TAPSE (M-mode): 1.4 cm  LEFT ATRIUM             Index        RIGHT ATRIUM           Index LA diam:        3.80 cm 1.97 cm/m   RA Area:     14.50 cm LA Vol (A2C):   53.5 ml 27.75 ml/m  RA Volume:   38.40 ml  19.92 ml/m LA Vol (A4C):   53.3 ml 27.64 ml/m LA Biplane Vol: 53.9 ml 27.96 ml/m AORTIC VALVE                     PULMONIC VALVE AV Area (Vmax):    3.47 cm      PV Vmax:       0.95 m/s AV Area (Vmean):   3.90 cm      PV Peak grad:  3.6 mmHg AV Area (VTI):     3.35 cm AV Vmax:           118.43 cm/s AV Vmean:          79.267 cm/s AV VTI:            0.210 m AV Peak Grad:      5.6 mmHg AV Mean Grad:      3.3 mmHg LVOT Vmax:         145.00 cm/s LVOT Vmean:         109.000 cm/s LVOT VTI:          0.249 m LVOT/AV VTI ratio: 1.18  AORTA Ao Root diam: 4.90 cm Ao Asc diam:  4.90 cm  MITRAL VALVE MV Area (PHT): 5.54 cm     SHUNTS MV Decel Time: 137 msec     Systemic VTI:  0.25 m MV E velocity: 124.00 cm/s  Systemic Diam: 1.90 cm MV A velocity: 26.20 cm/s MV E/A ratio:  4.73  Powell Sorrow MD Electronically signed by Powell Sorrow MD Signature Date/Time: 01/12/2023/7:03:36 PM    Final          ______________________________________________________________________________________________       Current Reported Medications:.    Current Meds  Medication Sig   amiodarone  (PACERONE ) 200 MG tablet TAKE 1 TABLET(200 MG) BY MOUTH DAILY   B Complex-C (B-COMPLEX WITH VITAMIN C) tablet Take 1 tablet by mouth daily.   BELSOMRA 10 MG TABS Take 1 tablet by mouth at bedtime.   CALCIUM  CITRATE PO Take 600 mg by mouth daily.   Cyanocobalamin (VITAMIN B-12) 2500 MCG SUBL Place 2,500 mcg under the tongue daily.   ELIQUIS  5 MG TABS tablet TAKE 1 TABLET(5 MG) BY MOUTH TWICE DAILY   empagliflozin (JARDIANCE) 10 MG TABS tablet Take 10 mg by mouth daily.   FLUoxetine  (PROZAC ) 40 MG capsule Take 40 mg by mouth daily.   furosemide  (LASIX ) 20 MG tablet TAKE 1 TABLET BY MOUTH DAILY AS DIRECTED   latanoprost  (XALATAN ) 0.005 % ophthalmic solution Place 1 drop into the right eye at bedtime.    levothyroxine  (SYNTHROID ) 112 MCG tablet Take 112 mcg by mouth every morning.   magnesium   oxide (MAG-OX) 400 (240 Mg) MG tablet Take 400 mg by mouth 2 (two) times daily.   NON FORMULARY Cranberry conascorib acid 300-100mg    Omega 3-6-9 Fatty Acids (TRIPLE OMEGA-3-6-9 PO) Take 400 mg by mouth daily.   rosuvastatin  (CRESTOR ) 40 MG tablet Take 40 mg by mouth at bedtime.   Skin Protectants, Misc. (MINERIN CREME EX) Apply 1 application  topically daily.   [DISCONTINUED] UNABLE TO FIND Med Name: cranberry concasorbic acid 300-100mg    Physical Exam:    VS:  BP 118/74    Pulse 66   Ht 4' 11 (1.499 m)   Wt 217 lb 6.4 oz (98.6 kg)   SpO2 96%   BMI 43.91 kg/m    Wt Readings from Last 3 Encounters:  01/01/24 217 lb 6.4 oz (98.6 kg)  09/15/23 209 lb 6.4 oz (95 kg)  07/26/23 202 lb (91.6 kg)    GEN: Well nourished, well developed in no acute distress NECK: No JVD; No carotid bruits CARDIAC: RRR, no murmurs, rubs, gallops RESPIRATORY:  Clear to auscultation without rales, wheezing or rhonchi  ABDOMEN: Soft, non-tender, non-distended EXTREMITIES:  Mild nonpitting ankle edema; No acute deformity     Asessement and Plan:.    Chronic diastolic HF: Last echo on 01/12/2023 indicated LVEF 60 to 65%, no RWMA, diastolic parameters were indeterminate.  Patient with 8 pound weight gain since last office visit, she reports she has not been regularly watching her weight at home.  She denies any significant shortness of breath, lower extremity edema, orthopnea or PND.  She does report feeling slightly more fatigued in recent days.  She remains on 2 L nasal cannula, at home typically does not require oxygen.  Patient has mild ankle edema, nonpitting overall she appears euvolemic and well compensated on exam.  Will have patient take an extra 20 mg of Lasix  this afternoon and increase to 40 mg daily for the next 2 days.  Encouraged patient to monitor her weights at home and notify the office of weight gain of 2 to 3 pounds overnight or 5 pounds in a week. Check CBC, CMET and BNP.   Hypertension: Blood pressure today 118/74. Continue current antihypertensive regimen.  PAF/hypercoagulable state: Patient denies any recent palpitations or feeling of atrial fibrillation. Patient is no longer on beta-blocker due to bradycardia, she denies any dizziness, lightheadedness, presyncope or syncope. Patient denies any bleeding problems on Eliquis . Continue Eliquis  5 mg twice daily and amiodarone  200 mg daily. Check CBC, CMET today, patient notes her PCP recently checked her thyroid  panel, she  will follow up with him tomorrow.  CHA2DS2-VASc Score = 4 [CHF History: 1, HTN History: 1, Diabetes History: 0, Stroke History: 0, Vascular Disease History: 0, Age Score: 1, Gender Score: 1].  Therefore, the patient's annual risk of stroke is 4.8 %.      Ascending aortic aneurysm: Patient with 5 cm with a chronic intramural hematoma along the right lateral ascending aortic wall.  She follows with Dr. Lucas, last seen in 07/2023, noted to have significant interval improvement in previously seen intramural hematoma, noted to have almost completely resolved.  She is to have follow-up with Dr. Sherrine in 1 year with a CTA of the chest.  Chronic hypoxic respiratory failure/COPD: On home supplemental oxygen. Patient currently using 2L , she reports when at home she typically does not require use of the oxygen.  She her reports that her breathing has been stable, denies any recent significant changes.  Hypothyroidism: Monitored and managed per PCP.  Patient reports she recently had thyroid  panel, to see her PCP tomorrow.     Disposition: F/u with Dr. Mona in 3-4 months per patient request, she will follow up sooner if needed.   Signed, Norberto Wishon D Maki Hege, NP

## 2024-01-01 ENCOUNTER — Encounter: Payer: Self-pay | Admitting: Cardiology

## 2024-01-01 ENCOUNTER — Ambulatory Visit: Attending: Cardiology | Admitting: Cardiology

## 2024-01-01 VITALS — BP 118/74 | HR 66 | Ht 59.0 in | Wt 217.4 lb

## 2024-01-01 DIAGNOSIS — I7121 Aneurysm of the ascending aorta, without rupture: Secondary | ICD-10-CM

## 2024-01-01 DIAGNOSIS — I480 Paroxysmal atrial fibrillation: Secondary | ICD-10-CM | POA: Diagnosis not present

## 2024-01-01 DIAGNOSIS — E039 Hypothyroidism, unspecified: Secondary | ICD-10-CM

## 2024-01-01 DIAGNOSIS — Z79899 Other long term (current) drug therapy: Secondary | ICD-10-CM

## 2024-01-01 DIAGNOSIS — I5032 Chronic diastolic (congestive) heart failure: Secondary | ICD-10-CM | POA: Diagnosis not present

## 2024-01-01 DIAGNOSIS — I10 Essential (primary) hypertension: Secondary | ICD-10-CM | POA: Diagnosis not present

## 2024-01-01 NOTE — Patient Instructions (Signed)
 Medication Instructions:  Take an additional dose of lasix  when you get home, Then take Lasix  40 mg tomorrow and Wednesday  *If you need a refill on your cardiac medications before your next appointment, please call your pharmacy*  Lab Work: Today we are going to draw a CBC, Cmet, and BNP If you have labs (blood work) drawn today and your tests are completely normal, you will receive your results only by: MyChart Message (if you have MyChart) OR A paper copy in the mail If you have any lab test that is abnormal or we need to change your treatment, we will call you to review the results.  Testing/Procedures: No testing  Follow-Up: At [facility], you and your health needs are our priority.  As part of our continuing mission to provide you with exceptional heart care, our providers are all part of one team.  This team includes your primary Cardiologist (physician) and Advanced Practice Providers or APPs (Physician Assistants and Nurse Practitioners) who all work together to provide you with the care you need, when you need it.  Your next appointment:   3 month(s)  Provider:   [name], MD  We recommend signing up for the patient portal called MyChart.  Sign up information is provided on this After Visit Summary.  MyChart is used to connect with patients for Virtual Visits (Telemedicine).  Patients are able to view lab/test results, encounter notes, upcoming appointments, etc.  Non-urgent messages can be sent to your provider as well.   To learn more about what you can do with MyChart, go to [email or web address].

## 2024-01-02 ENCOUNTER — Other Ambulatory Visit: Payer: Self-pay | Admitting: Family Medicine

## 2024-01-02 ENCOUNTER — Telehealth: Payer: Self-pay | Admitting: Cardiology

## 2024-01-02 DIAGNOSIS — R7401 Elevation of levels of liver transaminase levels: Secondary | ICD-10-CM

## 2024-01-02 LAB — COMPREHENSIVE METABOLIC PANEL WITH GFR
ALT: 96 IU/L — AB (ref 0–32)
AST: 75 IU/L — AB (ref 0–40)
Albumin: 4.2 g/dL (ref 3.9–4.9)
Alkaline Phosphatase: 129 IU/L — AB (ref 44–121)
BUN/Creatinine Ratio: 21 (ref 12–28)
BUN: 28 mg/dL — AB (ref 8–27)
Bilirubin Total: 0.6 mg/dL (ref 0.0–1.2)
CO2: 21 mmol/L (ref 20–29)
Calcium: 9.6 mg/dL (ref 8.7–10.3)
Chloride: 100 mmol/L (ref 96–106)
Creatinine, Ser: 1.33 mg/dL — AB (ref 0.57–1.00)
Globulin, Total: 2.2 g/dL (ref 1.5–4.5)
Glucose: 104 mg/dL — AB (ref 70–99)
Potassium: 4.5 mmol/L (ref 3.5–5.2)
Sodium: 140 mmol/L (ref 134–144)
Total Protein: 6.4 g/dL (ref 6.0–8.5)
eGFR: 43 mL/min/{1.73_m2} — AB (ref >59)

## 2024-01-02 LAB — BRAIN NATRIURETIC PEPTIDE: BNP: 311.3 pg/mL — AB (ref 0.0–100.0)

## 2024-01-02 LAB — CBC
Hematocrit: 48.1 % — ABNORMAL HIGH (ref 34.0–46.6)
Hemoglobin: 15.3 g/dL (ref 11.1–15.9)
MCH: 29.9 pg (ref 26.6–33.0)
MCHC: 31.8 g/dL (ref 31.5–35.7)
MCV: 94 fL (ref 79–97)
Platelets: 253 10*3/uL (ref 150–450)
RBC: 5.11 x10E6/uL (ref 3.77–5.28)
RDW: 14.8 % (ref 11.7–15.4)
WBC: 9.8 10*3/uL (ref 3.4–10.8)

## 2024-01-02 NOTE — Telephone Encounter (Signed)
 Dr. Maree called wanting to speak to Lauren Clark as she saw patient yesterday, he said it was about her treatment plan.

## 2024-01-04 ENCOUNTER — Ambulatory Visit: Payer: Self-pay | Admitting: Cardiology

## 2024-01-09 ENCOUNTER — Ambulatory Visit
Admission: RE | Admit: 2024-01-09 | Discharge: 2024-01-09 | Disposition: A | Discharge status: Home / Self Care | Source: Ambulatory Visit | Attending: Family Medicine | Admitting: Family Medicine

## 2024-01-09 DIAGNOSIS — R7401 Elevation of levels of liver transaminase levels: Secondary | ICD-10-CM

## 2024-01-11 NOTE — Telephone Encounter (Signed)
-----   Message from Katlyn D West sent at 01/04/2024  1:21 PM EDT ----- Please let Lauren Clark know that her CBC showed no evidence of anemia or infection.  Her kidney function is normal and electrolytes are normal.  Her liver function enzymes were elevated, discussed with  Dr. Mona as this may be due to amiodarone .  Please have her discontinue her amiodarone .  She should monitor for feelings of increased heart rates or palpitations, if present please let us  know.  Dr.  Mona will plan to reassess at follow-up visit in October. Her BNP shows evidence of possible mild fluid overload recommend she continue lasix  20 mg daily, she may take an additional 20 mg of lasix   daily as needed for increased shortness of breath and lower extremity edema.  ----- Message ----- From: Rebecka Memos Lab Results In Sent: 01/01/2024  11:35 PM EDT To: Katlyn D West, NP

## 2024-01-11 NOTE — Telephone Encounter (Signed)
 Left message to call back

## 2024-01-17 NOTE — Telephone Encounter (Signed)
 Left message to call back

## 2024-01-17 NOTE — Telephone Encounter (Signed)
-----   Message from Katlyn D West sent at 01/04/2024  1:21 PM EDT ----- Please let Ms. Keniston know that her CBC showed no evidence of anemia or infection.  Her kidney function is normal and electrolytes are normal.  Her liver function enzymes were elevated, discussed with  Dr. Mona as this may be due to amiodarone .  Please have her discontinue her amiodarone .  She should monitor for feelings of increased heart rates or palpitations, if present please let us  know.  Dr.  Mona will plan to reassess at follow-up visit in October. Her BNP shows evidence of possible mild fluid overload recommend she continue lasix  20 mg daily, she may take an additional 20 mg of lasix   daily as needed for increased shortness of breath and lower extremity edema.  ----- Message ----- From: Rebecka Memos Lab Results In Sent: 01/01/2024  11:35 PM EDT To: Katlyn D West, NP

## 2024-01-18 NOTE — Telephone Encounter (Signed)
 Left message to call back

## 2024-01-24 NOTE — Telephone Encounter (Signed)
 Called patient advised of below they verbalized understanding.

## 2024-02-09 ENCOUNTER — Other Ambulatory Visit (HOSPITAL_BASED_OUTPATIENT_CLINIC_OR_DEPARTMENT_OTHER): Payer: Self-pay | Admitting: Emergency Medicine

## 2024-02-09 DIAGNOSIS — D6859 Other primary thrombophilia: Secondary | ICD-10-CM

## 2024-02-09 DIAGNOSIS — I480 Paroxysmal atrial fibrillation: Secondary | ICD-10-CM

## 2024-02-09 NOTE — Telephone Encounter (Signed)
 Prescription refill request for Eliquis  received. Indication: Afib  Last office visit: 01/01/24 [name])  Scr: 1.33 (01/01/24)  Age: 68 Weight: 98.6kg  Appropriate dose. Refill sent.

## 2024-04-08 ENCOUNTER — Ambulatory Visit: Attending: Internal Medicine | Admitting: Internal Medicine

## 2024-04-08 ENCOUNTER — Encounter: Payer: Self-pay | Admitting: Internal Medicine

## 2024-04-08 VITALS — BP 142/58 | HR 49 | Resp 16 | Ht 59.0 in | Wt 220.2 lb

## 2024-04-08 DIAGNOSIS — R0602 Shortness of breath: Secondary | ICD-10-CM | POA: Diagnosis not present

## 2024-04-08 DIAGNOSIS — I5033 Acute on chronic diastolic (congestive) heart failure: Secondary | ICD-10-CM | POA: Diagnosis not present

## 2024-04-08 DIAGNOSIS — I480 Paroxysmal atrial fibrillation: Secondary | ICD-10-CM

## 2024-04-08 DIAGNOSIS — R0789 Other chest pain: Secondary | ICD-10-CM

## 2024-04-08 DIAGNOSIS — T148XXD Other injury of unspecified body region, subsequent encounter: Secondary | ICD-10-CM | POA: Diagnosis not present

## 2024-04-08 DIAGNOSIS — T148XXA Other injury of unspecified body region, initial encounter: Secondary | ICD-10-CM

## 2024-04-08 MED ORDER — FUROSEMIDE 40 MG PO TABS: 40.0000 mg | ORAL_TABLET | Freq: Every day | ORAL | 3 refills | Status: DC

## 2024-04-08 NOTE — Progress Notes (Signed)
 OFFICE NOTE  Chief Complaint:  Chest pressure, dyspnea  Primary Care Physician: [name], MD  HPI:  Lauren Clark is a 68 y.o. female with a past medical history significant for morbid obesity, hypertension, dyslipidemia and knee surgery last year which became infected and required repeat operations. She reports significant right hip problems and has not been a candidate for hip replacement due to her weight. She was referred then to Dr. Tanda for evaluation of bariatric surgery. Subsequently she was sent here for evaluation for cardiac risk. There is no significant heart disease in her family. She denies any history of chest pain or shortness of breath with exertion. She was fairly physically active up until last year and asymptomatic. She has type hypertension but is very well controlled. Blood pressure today was 100/64. Her cholesterol has been at goal. She has impaired fasting glucose but not diabetes. She has no history of alcohol or tobacco use. She previously worked as a Public relations account executive at [employer].  04/08/2024  Lauren Clark returns today for follow-up.  She had recently been seeing [facility].  She was last seen in June.  Overall she was felt to be fairly euvolemic however was advised a sliding scale Lasix .  Over the past several weeks she has had some progressive feeling of pressure in her chest.  This past weekend she woke up with chest pressure that lasted for about 5 to 10 minutes.  Lasix  is not on her list here although she reports continuing to take 20 mg daily.  She has had some issues with bradycardia.  She saw her primary care provider with [facility] who stopped her amiodarone .  Heart rate today is 49.  EKG shows a sinus bradycardia today.  Clearly there is some underlying conduction disease.  She does have a history of atrial fibrillation and we have to be mindful that she could have recurrent A-fib off of the amiodarone .  Her last echo was about a year ago which did  show normal systolic function but indeterminate diastolic function.  Since she is in sinus rhythm we could better assess her diastolic function I like to repeat that echo.  Another issue that she is having is intramural hematoma of her thoracic aorta.  She was seen by Dr. Lucas for this.  She had a CT scan last year which apparently showed stability.  Worsening of this of course could cause a chest pressure as well.  PMHx:  Past Medical History:  Diagnosis Date   Anemia    s/p knee surgery   Anxiety    Aortic dissection (HCC)    Arthritis    Depression    GERD (gastroesophageal reflux disease)    Glaucoma    right eye uses drops   History of blood transfusion    [facility]   History of MRSA infection    Hyperlipidemia    Hypertension    Hypothyroidism    Impaired vision in both eyes    blind left eye, limited vision right eye   Morbid obesity (HCC)    s/p Gastric sleeve- 8'17(loss thus far approx. 100 lbs)   Peripheral vascular disease    dvt right leg   Pre-diabetes    prior to gastric sleeve    Past Surgical History:  Procedure Laterality Date   ABDOMINAL HYSTERECTOMY  1981   with bilateral BSO   BREAST BIOPSY Right 10/18/2023   MM RT BREAST BX W LOC  DEV 1ST LESION IMAGE BX SPEC STEREO GUIDE 10/18/2023 [facility]   CATARACT EXTRACTION Right 2010   [facility]   COLONOSCOPY     DEBRIDEMENT AND CLOSURE WOUND Right 07/07/2014   Dr. Renato   EYE SURGERY     bil cataraction with IOL as a child, cataract surgery late 1990- right   JOINT REPLACEMENT Right 2016   knee   KNEE DEBRIDEMENT Right 06/22/2014   Dr. Reyes Boer with wound vac   KNEE SURGERY     i&d KNEE POST REPLACEMENT   LAPAROSCOPIC GASTRIC SLEEVE RESECTION N/A 02/29/2016   Procedure: LAPAROSCOPIC GASTRIC SLEEVE RESECTION, UPPER ENDO;  Surgeon: Camellia Blush, MD;  Location: [facility];  Service: General;  Laterality: N/A;   TONSILLECTOMY     TOTAL HIP ARTHROPLASTY  Right 06/14/2016   Procedure: RIGHT TOTAL HIP ARTHROPLASTY ANTERIOR APPROACH;  Surgeon: Donnice Car, MD;  Location: [facility];  Service: Orthopedics;  Laterality: Right;   TOTAL KNEE ARTHROPLASTY Left 09/05/2017   Procedure: LEFT TOTAL KNEE ARTHROPLASTY;  Surgeon: Car Donnice, MD;  Location: [facility];  Service: Orthopedics;  Laterality: Left;  Adductor Block   UPPER GI ENDOSCOPY  01/25/2016    FAMHx:  Family History  Problem Relation Age of Onset   Depression Mother    Hypertension Father    Breast cancer Neg Hx     SOCHx:   reports that she has never smoked. She has never used smokeless tobacco. She reports that she does not drink alcohol and does not use drugs.  ALLERGIES:  Allergies  Allergen Reactions   Crestor  [Rosuvastatin  Calcium ] Other (See Comments)    Muscle aches     Lipitor [Atorvastatin] Other (See Comments)    Muscle aches    Hctz [Hydrochlorothiazide] Other (See Comments)    Pt doesn't remember    ROS: Pertinent items noted in HPI and remainder of comprehensive ROS otherwise negative.  HOME MEDS: Current Outpatient Medications  Medication Sig Dispense Refill   acetaminophen -codeine (TYLENOL  #4) 300-60 MG tablet Take 1 tablet by mouth every 8 (eight) hours as needed for moderate pain (pain score 4-6).     B Complex-C (B-COMPLEX WITH VITAMIN C) tablet Take 1 tablet by mouth daily.     BELSOMRA 10 MG TABS Take 1 tablet by mouth at bedtime.     CALCIUM  CITRATE PO Take 600 mg by mouth daily.     Cyanocobalamin (VITAMIN B-12) 2500 MCG SUBL Place 2,500 mcg under the tongue daily.     ELIQUIS  5 MG TABS tablet TAKE 1 TABLET(5 MG) BY MOUTH TWICE DAILY 180 tablet 1   empagliflozin (JARDIANCE) 10 MG TABS tablet Take 10 mg by mouth daily.     FLUoxetine  (PROZAC ) 40 MG capsule Take 40 mg by mouth daily.     latanoprost  (XALATAN ) 0.005 % ophthalmic solution Place 1 drop into the right eye at bedtime.      levothyroxine  (SYNTHROID ) 112 MCG tablet Take 112 mcg by mouth every  morning.     magnesium  oxide (MAG-OX) 400 (240 Mg) MG tablet Take 400 mg by mouth 2 (two) times daily.     NON FORMULARY Cranberry conascorib acid 300-100mg      Omega 3-6-9 Fatty Acids (TRIPLE OMEGA-3-6-9 PO) Take 400 mg by mouth daily.     rosuvastatin  (CRESTOR ) 40 MG tablet Take 40 mg by mouth at bedtime.     Skin Protectants, Misc. (MINERIN CREME EX) Apply 1 application  topically daily.     furosemide  (LASIX ) 40 MG tablet  Take 1 tablet (40 mg total) by mouth daily. 90 tablet 3   No current facility-administered medications for this visit.    LABS/IMAGING: No results found for this or any previous visit (from the past 48 hours). No results found.  WEIGHTS: Wt Readings from Last 3 Encounters:  04/08/24 220 lb 3.2 oz (99.9 kg)  01/01/24 217 lb 6.4 oz (98.6 kg)  09/15/23 209 lb 6.4 oz (95 kg)    VITALS: BP (!) 142/58 (BP Location: Left Arm, Patient Position: Sitting, Cuff Size: Large)   Pulse (!) 49   Resp 16   Ht 4' 11 (1.499 m)   Wt 220 lb 3.2 oz (99.9 kg)   SpO2 94%   BMI 44.48 kg/m   EXAM: General appearance: alert, no distress and morbidly obese Neck: no carotid bruit and no JVD Lungs: clear to auscultation bilaterally Heart: regular rate and rhythm Abdomen: soft, non-tender; bowel sounds normal; no masses,  no organomegaly and morbidly obese Extremities: extremities normal, atraumatic, no cyanosis or edema and right knee midline scar Pulses: 2+ and symmetric Skin: Skin color, texture, turgor normal. No rashes or lesions Neurologic: Grossly normal Psych: Pleasant  EKG: EKG Interpretation Date/Time:  Monday April 08 2024 10:20:38 EDT Ventricular Rate:  49 PR Interval:  210 QRS Duration:  94 QT Interval:  476 QTC Calculation: 429 R Axis:   -37  Text Interpretation: Sinus bradycardia with 1st degree A-V block Left axis deviation When compared with ECG of 15-Sep-2023 10:49, QRS axis Shifted left  Compared to previous tracing the rate is slower Confirmed  by Mona Kent [phone]) on 04/08/2024 11:08:49 AM   ASSESSMENT: Chest pressure and dyspnea History of acute intramural hematoma of the thoracic aorta Coronary artery atherosclerosis Chronic diastolic heart failure Paroxysmal atrial fibrillation-off amiodarone  due to bradycardia Hypertension-controlled Dyslipidemia-at goal Morbid obesity Right hip pain  PLAN: 1.   Mrs. Hai returns for follow-up.  She has recently been seen by her nurse practitioners.  She has a chronic diastolic heart failure and recently has been more dyspneic with chest pressure.  Previously she had an acute intramural hematoma which has been followed by Dr. Lucas but has significantly improved if not resolved based on imaging in December 2024.  I would like to repeat imaging of the aorta and get a separate CT angiogram of the aorta to evaluate for any obstructive coronary disease given her chest pressure symptoms.  She has not had a prior ischemic workup.  In addition we will update her echo which was a year ago as well.  I like to increase her Lasix  from 20 to 40 mg daily and check a BMP and BNP next week.  Will schedule follow-up afterwards.  Kent KYM Mona, MD, [facility], FNLA, FACP  [place]  [facility]  Medical Director of the Advanced Lipid Disorders &  Cardiovascular Risk Reduction Clinic Diplomate of the American Board of Clinical Lipidology Attending Cardiologist  Direct Dial: [phone]  Fax: [phone]  Website:  [email or web address]   Kent BROCKS Adamae Ricklefs 04/08/2024, 11:09 AM

## 2024-04-08 NOTE — Patient Instructions (Signed)
 Medication Instructions:  INCREASE lasix  to 40mg  daily  *If you need a refill on your cardiac medications before your next appointment, please call your pharmacy*  Lab Work: Non-Fasting lab work in 7-10 days -- BMET and BNP  If you have labs (blood work) drawn today and your tests are completely normal, you will receive your results only by: MyChart Message (if you have MyChart) OR A paper copy in the mail If you have any lab test that is abnormal or we need to change your treatment, we will call you to review the results.  Testing/Procedures: Your physician has requested that you have an echocardiogram. Echocardiography is a painless test that uses sound waves to create images of your heart. It provides your doctor with information about the size and shape of your heart and how well your heart's chambers and valves are working. This procedure takes approximately one hour. There are no restrictions for this procedure. Please do NOT wear cologne, perfume, aftershave, or lotions (deodorant is allowed). Please arrive 15 minutes prior to your appointment time.  Please note: We ask at that you not bring children with you during ultrasound (echo/ vascular) testing. Due to room size and safety concerns, children are not allowed in the ultrasound rooms during exams. Our front office staff cannot provide observation of children in our lobby area while testing is being conducted. An adult accompanying a patient to their appointment will only be allowed in the ultrasound room at the discretion of the ultrasound technician under special circumstances. We apologize for any inconvenience.  CT angiogram chest/aorta  Coronary CTA  Follow-Up: At [facility], you and your health needs are our priority.  As part of our continuing mission to provide you with exceptional heart care, our providers are all part of one team.  This team includes your primary Cardiologist (physician) and Advanced Practice  Providers or APPs (Physician Assistants and Nurse Practitioners) who all work together to provide you with the care you need, when you need it.  Your next appointment:    4-6 weeks -- after testing Katlyn West NP  We recommend signing up for the patient portal called MyChart.  Sign up information is provided on this After Visit Summary.  MyChart is used to connect with patients for Virtual Visits (Telemedicine).  Patients are able to view lab/test results, encounter notes, upcoming appointments, etc.  Non-urgent messages can be sent to your provider as well.   To learn more about what you can do with MyChart, go to [email or web address].   Other Instructions    Your cardiac CT will be scheduled at one of the below locations:   [facility] [address] [place], [place] [address] [phone] (Severe contrast allergies only)  OR   [facility] [address] [place], [place] [address] [phone]  OR   [facility] [address] [place], [place] [address] [phone]  OR   [facility] [address]  [place], [place] [address] [phone]  OR   [facility] [address] [place], [place] [phone]  If scheduled at [facility], please arrive at the [facility] and Children's Entrance (Entrance C2) of [facility] 30 minutes prior to test start time. You can use the FREE valet parking offered at entrance C (encouraged to control the heart rate for the test)  Proceed to the [facility] Radiology Department (first floor) to check-in and test prep.  All radiology patients and guests should use entrance C2 at [facility], accessed from [facility], even though the hospital's physical address listed is [address]  If scheduled at the Heart and Vascular Tower at [address], please enter the parking lot using the [address]  street entrance and use the FREE valet service at the patient drop-off area. Enter the building and check-in with registration on the main floor.  If scheduled at [facility], please arrive to the Heart and Vascular Center 15 mins early for check-in and test prep.  There is spacious parking and easy access to the radiology department from the [facility] Heart and Vascular entrance. Please enter here and check-in with the desk attendant.   If scheduled at [facility], please arrive 30 minutes early for check-in and test prep.  Please follow these instructions carefully (unless otherwise directed):  An IV will be required for this test and Nitroglycerin will be given.  Hold all erectile dysfunction medications at least 3 days (72 hrs) prior to test. (Ie viagra, cialis, sildenafil, tadalafil, etc)   On the Night Before the Test: Be sure to Drink plenty of water . Do not consume any caffeinated/decaffeinated beverages or chocolate 12 hours prior to your test. Do not take any antihistamines 12 hours prior to your test.  If the patient has contrast allergy: Patient will need a prescription for Prednisone and very clear instructions (as follows): Prednisone 50 mg - take 13 hours prior to test Take another Prednisone 50 mg 7 hours prior to test Take another Prednisone 50 mg 1 hour prior to test Take Benadryl  50 mg 1 hour prior to test Patient must complete all four doses of above prophylactic medications. Patient will need a ride after test due to Benadryl .  On the Day of the Test: Drink plenty of water  until 1 hour prior to the test. Do not eat any food 1 hour prior to test. You may take your regular medications prior to the test.  Take metoprolol  (Lopressor ) two hours prior to test. If you take Furosemide /Hydrochlorothiazide/Spironolactone/Chlorthalidone, please HOLD on the morning of the test. Patients who wear a continuous glucose monitor MUST remove the device  prior to scanning. FEMALES- please wear underwire-free bra if available, avoid dresses & tight clothing      After the Test: Drink plenty of water . After receiving IV contrast, you may experience a mild flushed feeling. This is normal. On occasion, you may experience a mild rash up to 24 hours after the test. This is not dangerous. If this occurs, you can take Benadryl  25 mg, Zyrtec, Claritin, or Allegra and increase your fluid intake. (Patients taking Tikosyn should avoid Benadryl , and may take Zyrtec, Claritin, or Allegra) If you experience trouble breathing, this can be serious. If it is severe call 911 IMMEDIATELY. If it is mild, please call our office.  We will call to schedule your test 2-4 weeks out understanding that some insurance companies will need an authorization prior to the service being performed.   For more information and frequently asked questions, please visit our website : [email or web address]  For non-scheduling related questions, please contact the cardiac imaging nurse navigator should you have any questions/concerns: Cardiac Imaging Nurse Navigators Direct Office Dial: [phone]   For scheduling needs, including cancellations and rescheduling, please call [place], [phone].

## 2024-04-12 ENCOUNTER — Other Ambulatory Visit: Payer: Self-pay | Admitting: Family Medicine

## 2024-04-12 DIAGNOSIS — K760 Fatty (change of) liver, not elsewhere classified: Secondary | ICD-10-CM

## 2024-04-16 LAB — BASIC METABOLIC PANEL WITH GFR
BUN/Creatinine Ratio: 20 (ref 12–28)
BUN: 24 mg/dL (ref 8–27)
CO2: 23 mmol/L (ref 20–29)
Calcium: 9.3 mg/dL (ref 8.7–10.3)
Chloride: 99 mmol/L (ref 96–106)
Creatinine, Ser: 1.2 mg/dL — ABNORMAL HIGH (ref 0.57–1.00)
Glucose: 179 mg/dL — ABNORMAL HIGH (ref 70–99)
Potassium: 3.8 mmol/L (ref 3.5–5.2)
Sodium: 140 mmol/L (ref 134–144)
eGFR: 49 mL/min/1.73 — ABNORMAL LOW (ref >59)

## 2024-04-16 LAB — BRAIN NATRIURETIC PEPTIDE: BNP: 179.8 pg/mL — AB (ref 0.0–100.0)

## 2024-04-17 ENCOUNTER — Ambulatory Visit: Payer: Self-pay | Admitting: Internal Medicine

## 2024-04-18 ENCOUNTER — Encounter (HOSPITAL_COMMUNITY): Payer: Self-pay

## 2024-04-22 ENCOUNTER — Ambulatory Visit (HOSPITAL_COMMUNITY)
Admission: RE | Admit: 2024-04-22 | Discharge: 2024-04-22 | Disposition: A | Discharge status: Home / Self Care | Source: Ambulatory Visit | Attending: Internal Medicine | Admitting: Internal Medicine

## 2024-04-22 ENCOUNTER — Other Ambulatory Visit: Payer: Self-pay | Admitting: Cardiovascular Disease

## 2024-04-22 ENCOUNTER — Ambulatory Visit (HOSPITAL_BASED_OUTPATIENT_CLINIC_OR_DEPARTMENT_OTHER)
Admission: RE | Admit: 2024-04-22 | Discharge: 2024-04-22 | Disposition: A | Discharge status: Home / Self Care | Source: Ambulatory Visit | Attending: Cardiovascular Disease | Admitting: Cardiovascular Disease

## 2024-04-22 DIAGNOSIS — Q2543 Congenital aneurysm of aorta: Secondary | ICD-10-CM | POA: Insufficient documentation

## 2024-04-22 DIAGNOSIS — I517 Cardiomegaly: Secondary | ICD-10-CM | POA: Diagnosis not present

## 2024-04-22 DIAGNOSIS — R931 Abnormal findings on diagnostic imaging of heart and coronary circulation: Secondary | ICD-10-CM | POA: Diagnosis present

## 2024-04-22 DIAGNOSIS — T148XXA Other injury of unspecified body region, initial encounter: Secondary | ICD-10-CM

## 2024-04-22 DIAGNOSIS — R0789 Other chest pain: Secondary | ICD-10-CM | POA: Insufficient documentation

## 2024-04-22 DIAGNOSIS — I2510 Atherosclerotic heart disease of native coronary artery without angina pectoris: Secondary | ICD-10-CM | POA: Diagnosis not present

## 2024-04-22 DIAGNOSIS — I700 Atherosclerosis of aorta: Secondary | ICD-10-CM | POA: Insufficient documentation

## 2024-04-22 MED ORDER — IOHEXOL 350 MG/ML SOLN
100.0000 mL | Freq: Once | INTRAVENOUS | Status: AC | PRN
Start: 2024-04-22 — End: 2024-04-22
  Administered 2024-04-22: 100 mL via INTRAVENOUS

## 2024-04-22 MED ORDER — NITROGLYCERIN 0.4 MG SL SUBL
0.8000 mg | SUBLINGUAL_TABLET | Freq: Once | SUBLINGUAL | Status: AC
  Administered 2024-04-22: 0.8 mg via SUBLINGUAL

## 2024-04-22 NOTE — Progress Notes (Signed)
 CT FFR ordered.  Signed, Darryle DASEN. Barbaraann, MD, [facility]  [facility]  [address] [place], [place] [address] [phone]  9:31 PM

## 2024-04-28 ENCOUNTER — Ambulatory Visit: Payer: Self-pay | Admitting: Internal Medicine

## 2024-05-20 ENCOUNTER — Ambulatory Visit (HOSPITAL_COMMUNITY)
Admission: RE | Admit: 2024-05-20 | Discharge: 2024-05-20 | Disposition: A | Discharge status: Home / Self Care | Source: Ambulatory Visit | Attending: Cardiology | Admitting: Cardiology

## 2024-05-20 DIAGNOSIS — I5033 Acute on chronic diastolic (congestive) heart failure: Secondary | ICD-10-CM | POA: Diagnosis present

## 2024-05-20 DIAGNOSIS — R0602 Shortness of breath: Secondary | ICD-10-CM | POA: Diagnosis present

## 2024-05-20 LAB — ECHOCARDIOGRAM COMPLETE: S' Lateral: 3.03 cm

## 2024-05-23 ENCOUNTER — Ambulatory Visit
Admission: RE | Admit: 2024-05-23 | Discharge: 2024-05-23 | Disposition: A | Source: Ambulatory Visit | Attending: Family Medicine | Admitting: Family Medicine

## 2024-05-23 ENCOUNTER — Ambulatory Visit
Admission: RE | Admit: 2024-05-23 | Discharge: 2024-05-23 | Disposition: A | Discharge status: Home / Self Care | Source: Ambulatory Visit | Attending: Family Medicine | Admitting: Family Medicine

## 2024-05-23 DIAGNOSIS — K760 Fatty (change of) liver, not elsewhere classified: Secondary | ICD-10-CM

## 2024-05-23 MED ORDER — GLUCAGON HCL RDNA (DIAGNOSTIC) 1 MG IJ SOLR: 1.0000 mg | Freq: Once | INTRAMUSCULAR | Status: DC | PRN

## 2024-05-23 MED ORDER — GLUCAGON HCL RDNA (DIAGNOSTIC) 1 MG IJ SOLR
1.0000 mg | Freq: Once | INTRAMUSCULAR | Status: AC | PRN
  Administered 2024-05-23: 1 mg via INTRAMUSCULAR

## 2024-05-23 MED ORDER — GADOPICLENOL 0.5 MMOL/ML IV SOLN
10.0000 mL | Freq: Once | INTRAVENOUS | Status: AC | PRN
  Administered 2024-05-23: 10 mL via INTRAVENOUS

## 2024-05-27 NOTE — Progress Notes (Signed)
 Cardiology Office Note   Date:  06/04/2024  ID:  Mattilynn, Forrer 05-11-1956, MRN [id number] PCP:  [name], MD  Cardiologist:  Vinie JAYSON Maxcy, MD  Electrophysiologist:  None   Chief Complaint: Follow up for CAD  History of Present Illness: .   Lauren Clark is a 68 y.o. female with visit-pertinent history of atrial fibrillation, chronic diastolic heart failure, hypertension, hyperlipidemia, ascending aorta and aortic root aneurysm, hypothyroidism, morbid obesity s/p gastric sleeve resection, DVT.   Patient previously admitted from 5/11 through 11/19/2022 with dissection of the ascending order with evidence of chronic intramural hematoma along right lateral ascending aortic wall.  She was not a candidate for surgical resection and recommended to remain off OAC.  Initial plan was for palliative care follow-up.   In 01/2023 she was admitted with acute on chronic diastolic heart failure.  Echo on 01/12/2023 indicated LVEF of 60 to 65%, no LVH, RV systolic function preserved, no significant valvular disease, ascending aortic aneurysm 4.9 cm.  Treated with IV Lasix  and discharged on as needed Lasix .  No ACE/ARB/Arni or SGLT2 due to renal function.  She was discharged on 2 L nasal cannula and Eliquis  was resumed prior to discharge.   Patient was seen in clinic on 02/03/2023, she was referred to palliative care through [facility] supportive care given recent hospitalizations and nonoperable a ascending aortic aneurysm. On follow up on 05/09/2023, she remained stable from cardiac standpoint.   Patient was last seen in clinic on 09/15/2023 for follow-up.  She remains stable from a cardiac standpoint.  Patient reported that she had decreased oxygen requirements and when out of the house was wearing 2 L nasal cannula but when at home was not requiring oxygen.  Patient was last in the clinic on 04/08/2024 by Dr. Maxcy.  Patient reported that she been having some progressive feelings of pressure in her chest,  the past weekend had woken up with chest pressure that lasted for 5 to 10 minutes.  Patient's PCP discontinued her amiodarone .  EKG in office showed sinus bradycardia.  Patient's Lasix  was increased from 20 mg to 40 mg daily, coronary CTA and echocardiogram as well as CTA chest aorta.  Aortic root aneurysm with RCC measured up to 53 mm, ascending aorta measured up to 40 mm, patient with penetrating aortic ulceration noted in the ascending aorta in the 6 o'clock position measuring up to 1.6 m x 0.6 m, similar to prior study.  Coronary CTA on 04/22/2024 indicated coronary calcium  score of 987, this was 97 percentile for age, sex and race matched control, she had mild to moderate mixed density plaque in the proximal RCA at 40 to 50%, mild mixed density plaque in the proximal LAD and OM1 at 25 to 49%, minimal plaque in the LCx at less than 25%.  FFR analysis with no significant stenosis.  Echocardiogram on 05/20/2024 indicated LVEF 55 to 60%, mild concentric LVH, G2 DD, RV systolic function and size is normal, LA was mildly dilated, mild mitral valve regurgitation with no evidence of stenosis, mild calcification of the aortic valve with no stenosis present, aortic valve regurgitation was mild to moderate, moderate dilation of the aortic root measuring 44 mm, mild dilation of the ascending aorta measuring 43 mm.  Today she presents for follow up. She reports that she has been doing ok. She reports her breathing has been stable, she continues to use 2 L nasal cannula.  On further discussion patient reports that she is  does not use her oxygen regularly overnight.  She continues to note some occasional chest tightness during periods of increased fatigue, notes this only last for a few seconds, denies any other associated symptoms.  She reports ongoing problems with good thyroid  control, this is managed by her PCP.  Patient does not follow with a pulmonologist, she declined pulmonology referral today. ROS: .   Today  she denies lower extremity edema, palpitations, melena, hematuria, hemoptysis, diaphoresis, weakness, presyncope, syncope, orthopnea, and PND.  All other systems are reviewed and otherwise negative. Studies Reviewed: [name]   EKG:  EKG is not ordered today.  CV Studies: Cardiac studies reviewed are outlined and summarized above. Otherwise please see EMR for full report. Cardiac Studies & Procedures   ______________________________________________________________________________________________     ECHOCARDIOGRAM  ECHOCARDIOGRAM COMPLETE 05/20/2024  Narrative ECHOCARDIOGRAM REPORT    Patient Name:   CAITLYNE INGHAM  Date of Exam: 05/20/2024 Medical Rec #:  [id number]     Height:       59.0 in Accession #:    [id number]    Weight:       220.2 lb Date of Birth:  1956/04/30     BSA:          1.921 m Patient Age:    68 years      BP:           118/74 mmHg Patient Gender: F             HR:           53 bpm. Exam Location:  [address]  Procedure: 2D Echo and Strain Analysis (Both Spectral and Color Flow Doppler were utilized during procedure).  Indications:    R06.00 Dyspnea  History:        Patient has prior history of Echocardiogram examinations, most recent 01/12/2023. CHF, Arrythmias:Atrial Fibrillation, Signs/Symptoms:Shortness of Breath; Risk Factors:Hypertension and Dyslipidemia. Morbid obesity. Intramural aortic hematoma. Chest pressure. Aortic dissection. Hypothyroidism. Prediabetes.  Sonographer:    Jon Hacker RCS Referring Phys: [phone] KENNETH C HILTY  IMPRESSIONS   1. Left ventricular ejection fraction, by estimation, is 55 to 60%. The left ventricle has normal function. There is mild concentric left ventricular hypertrophy. Left ventricular diastolic parameters are consistent with Grade II diastolic dysfunction (pseudonormalization). The average left ventricular global longitudinal strain is -21.9 %. The global longitudinal strain is normal. 2. Right ventricular  systolic function is normal. The right ventricular size is normal. Tricuspid regurgitation signal is inadequate for assessing PA pressure. 3. Left atrial size was mildly dilated. 4. The mitral valve is normal in structure. Mild mitral valve regurgitation. No evidence of mitral stenosis. 5. The aortic valve was not well visualized. There is mild calcification of the aortic valve. Aortic valve regurgitation is mild to moderate. No aortic stenosis is present. 6. Aortic dilatation noted. There is moderate dilatation of the aortic root, measuring 44 mm. There is mild dilatation of the ascending aorta, measuring 43 mm. 7. The inferior vena cava is normal in size with greater than 50% respiratory variability, suggesting right atrial pressure of 3 mmHg.  FINDINGS Left Ventricle: Left ventricular ejection fraction, by estimation, is 55 to 60%. The left ventricle has normal function. The average left ventricular global longitudinal strain is -21.9 %. Strain was performed and the global longitudinal strain is normal. The left ventricular internal cavity size was normal in size. There is mild concentric left ventricular hypertrophy. Left ventricular diastolic parameters are consistent with Grade II diastolic dysfunction (pseudonormalization).  Right Ventricle: The right ventricular size is normal. No increase in right ventricular wall thickness. Right ventricular systolic function is normal. Tricuspid regurgitation signal is inadequate for assessing PA pressure.  Left Atrium: Left atrial size was mildly dilated.  Right Atrium: Right atrial size was normal in size.  Pericardium: There is no evidence of pericardial effusion.  Mitral Valve: The mitral valve is normal in structure. Mild mitral annular calcification. Mild mitral valve regurgitation. No evidence of mitral valve stenosis.  Tricuspid Valve: The tricuspid valve is normal in structure. Tricuspid valve regurgitation is trivial.  Aortic Valve: The  aortic valve was not well visualized. There is mild calcification of the aortic valve. Aortic valve regurgitation is mild to moderate. No aortic stenosis is present.  Pulmonic Valve: The pulmonic valve was normal in structure. Pulmonic valve regurgitation is not visualized.  Aorta: Aortic dilatation noted. There is moderate dilatation of the aortic root, measuring 44 mm. There is mild dilatation of the ascending aorta, measuring 43 mm.  Venous: The inferior vena cava is normal in size with greater than 50% respiratory variability, suggesting right atrial pressure of 3 mmHg.  IAS/Shunts: No atrial level shunt detected by color flow Doppler.   LEFT VENTRICLE PLAX 2D LVIDd:         4.56 cm   Diastology LVIDs:         3.03 cm   LV e' medial:    9.25 cm/s LV PW:         1.23 cm   LV E/e' medial:  12.2 LV IVS:        0.88 cm   LV e' lateral:   11.20 cm/s LVOT diam:     2.00 cm   LV E/e' lateral: 10.1 LV SV:         117 LV SV Index:   61        2D Longitudinal Strain LVOT Area:     3.14 cm  2D Strain GLS (A4C):   -19.9 % 2D Strain GLS (A3C):   -21.9 % 2D Strain GLS (A2C):   -24.0 % 2D Strain GLS Avg:     -21.9 %  RIGHT VENTRICLE RV Basal diam:  2.77 cm RV S prime:     15.70 cm/s TAPSE (M-mode): 1.9 cm  LEFT ATRIUM             Index        RIGHT ATRIUM           Index LA diam:        4.00 cm 2.08 cm/m   RA Area:     12.80 cm LA Vol (A2C):   48.2 ml 25.09 ml/m  RA Volume:   30.20 ml  15.72 ml/m LA Vol (A4C):   60.2 ml 31.33 ml/m LA Biplane Vol: 54.4 ml 28.31 ml/m AORTIC VALVE LVOT Vmax:   136.00 cm/s LVOT Vmean:  89.400 cm/s LVOT VTI:    0.374 m  AORTA Ao Root diam: 4.40 cm Ao Asc diam:  4.30 cm  MV E velocity: 113.00 cm/s MV A velocity: 66.10 cm/s   SHUNTS MV E/A ratio:  1.71         Systemic VTI:  0.37 m Systemic Diam: 2.00 cm  Dalton McleanMD Electronically signed by Ezra Kanner Signature Date/Time: 05/20/2024/5:29:14 PM    Final      CT SCANS  CT  CORONARY MORPH W/CTA COR W/SCORE 04/28/2024  Addendum 04/28/2024  8:55 PM ADDENDUM REPORT: 04/28/2024 20:53  EXAM: OVER-READ INTERPRETATION  CT CHEST  The following report is an over-read performed by radiologist Dr. Andrea Gasman of [facility] on 04/28/2024. This over-read does not include interpretation of cardiac or coronary anatomy or pathology. The coronary CTA interpretation by the cardiologist is attached.  COMPARISON:  Chest CTA 06/21/2023  FINDINGS: Vascular: Aortic atherosclerosis. Aortic root aneurysm, reference cardiology report for details. Stable appearance of posterior ascending aortic irregularity, sequela of previous aortic hematoma with probable pseudoaneurysm, series 304, image 13. Descending aorta is tortuous.  Mediastinum/nodes: No adenopathy or mass. Unremarkable esophagus.  Lungs: Scattered atelectasis. No pulmonary nodule. No pleural fluid. The included airways are patent.  Upper abdomen: No acute or unexpected findings.  Musculoskeletal: There are no acute or suspicious osseous abnormalities. Remote right rib fracture. Thoracic spondylosis.  IMPRESSION: 1. No acute extracardiac findings. 2. Stable appearance of posterior ascending aortic irregularity, sequela of previous aortic hematoma with probable pseudoaneurysm. Aortic root aneurysm, reference cardiology report for details.  Aortic Atherosclerosis (ICD10-I70.0).   Electronically Signed By: Andrea Gasman M.D. On: 04/28/2024 20:53  Narrative CLINICAL DATA:  Chest pain  EXAM: Cardiac/Coronary CTA  TECHNIQUE: A non-contrast, gated CT scan was obtained with axial slices of 2.5 mm through the heart for calcium  scoring. Calcium  scoring was performed using the Agatston method. A 120 kV prospective, gated, contrast cardiac CT scan was obtained. Gantry rotation speed was 230 msec and collimation was 0.63 mm. Two sublingual nitroglycerin  tablets (0.8 mg) were given. The 3D  data set was reconstructed with motion correction for the best systolic or diastolic phase. Images were analyzed on a dedicated workstation using MPR, MIP, and VRT modes. The patient received 95 cc of contrast.  FINDINGS: Image quality: Excellent.  Noise artifact is: Limited.  Coronary Arteries:  Normal coronary origin.  Right dominance.  Left main: The left main is a large caliber vessel with a normal take off from the left coronary cusp that bifurcates to form a left anterior descending artery and a left circumflex artery. There is minimal calcified plaque (<25%).  Left anterior descending artery: The proximal LAD contains mild mixed density plaque (25-49%). The mid and distal segments are patent. D1 and D2 are too small for analysis. D3 contains mild mixed density plaque (25-49%).  Left circumflex artery: The LCX is non-dominant. There is minimal non-calcified plaque (<25%). OM1 contains mild non-calcified plaque (25-49%). OM2 is patent.  Right coronary artery: The RCA is dominant with normal take off from the right coronary cusp. There is mild to moderate plaque in the proximal segment (40-50%). The mid and distal segments contain minimal non-calcified plaque (<25%). The RCA terminates as a PDA and right posterolateral branch without evidence of plaque or stenosis.  Right Atrium: Right atrial size is within normal limits.  Right Ventricle: The right ventricular cavity is within normal limits.  Left Atrium: Left atrial size is normal in size with no left atrial appendage filling defect.  Left Ventricle: The ventricular cavity size is within normal limits.  Pulmonary arteries: Dilated pulmonary artery suggestive of pulmonary hypertension.  Pulmonary veins: Normal pulmonary venous drainage.  Pericardium: Normal thickness without significant effusion or calcium  present.  Cardiac valves: The aortic valve is trileaflet without significant calcification. The mitral  valve is normal without significant calcification.  Aorta: Aortic root aneurysm of the RCC which measures up to 53 mm (double-oblique). Ascending aorta measures up to 40 mm. Penetrating aortic ulceration noted in the ascending aorta in the 6 o'clock position measuring up to 1.6 cm x 0.6 cm, similar  to prior study.  Extra-cardiac findings: See attached radiology report for non-cardiac structures.  IMPRESSION: 1. Coronary calcium  score of 987. This was 97th percentile for age-, sex, and race-matched controls.  2. Normal coronary origin with right dominance.  3. Mild to moderate mixed density plaque in the proximal RCA (40-50%).  4. Mild mixed density plaque in the proximal LAD and OM1 (25-49%).  5. Minimal plaque in the LCX (<25%).  6. Aortic root aneurysm with dilation of the RCC up to 53 mm. Unchanged from prior study.  7. Ascending aorta measures 40 mm. Penetrating aortic ulceration in the ascending aorta with similar appearance to prior study.  8. Dilated pulmonary artery suggestive of pulmonary hypertension.  RECOMMENDATIONS: 1. CAD-RADS 3: Moderate stenosis. Consider symptom-guided anti-ischemic pharmacotherapy as well as risk factor modification per guideline directed care. Additional analysis with CT FFR will be submitted.  Darryle Decent, MD  Electronically Signed: By: Darryle Decent M.D. On: 04/22/2024 21:31     ______________________________________________________________________________________________       Current Reported Medications:.    Current Meds  Medication Sig   acetaminophen -codeine (TYLENOL  #4) 300-60 MG tablet Take 1 tablet by mouth every 8 (eight) hours as needed for moderate pain (pain score 4-6).   B Complex-C (B-COMPLEX WITH VITAMIN C) tablet Take 1 tablet by mouth daily.   BELSOMRA 10 MG TABS Take 1 tablet by mouth at bedtime.   CALCIUM  CITRATE PO Take 600 mg by mouth daily.   Cyanocobalamin (VITAMIN B-12) 2500 MCG SUBL Place 2,500  mcg under the tongue daily.   ELIQUIS  5 MG TABS tablet TAKE 1 TABLET(5 MG) BY MOUTH TWICE DAILY   empagliflozin (JARDIANCE) 10 MG TABS tablet Take 10 mg by mouth daily.   FLUoxetine  (PROZAC ) 40 MG capsule Take 40 mg by mouth daily.   furosemide  (LASIX ) 40 MG tablet Take 1 tablet (40 mg total) by mouth daily.   latanoprost  (XALATAN ) 0.005 % ophthalmic solution Place 1 drop into the right eye at bedtime.    levothyroxine  (SYNTHROID ) 112 MCG tablet Take 112 mcg by mouth every morning.   magnesium  oxide (MAG-OX) 400 (240 Mg) MG tablet Take 400 mg by mouth 2 (two) times daily.   NON FORMULARY Cranberry conascorib acid 300-100mg    Omega 3-6-9 Fatty Acids (TRIPLE OMEGA-3-6-9 PO) Take 400 mg by mouth daily.   rosuvastatin  (CRESTOR ) 40 MG tablet Take 40 mg by mouth at bedtime.   Skin Protectants, Misc. (MINERIN CREME EX) Apply 1 application  topically daily.   traZODone  (DESYREL ) 50 MG tablet TAKE 1 TABLET BY MOUTH DAILY AT BEDTIME AS NEEDED FOR INSOMNIA   [DISCONTINUED] amiodarone  (PACERONE ) 200 MG tablet TAKE 1 TABLET BY MOUTH TWICE DAILY FOR 2 WEEKS THEN TAKE 1 TABLET BY MOUTH DAILY   Physical Exam:    VS:  BP 110/62   Pulse 60   Ht 4' 11 (1.499 m)   Wt 215 lb (97.5 kg)   SpO2 95%   BMI 43.42 kg/m    Wt Readings from Last 3 Encounters:  06/04/24 215 lb (97.5 kg)  04/08/24 220 lb 3.2 oz (99.9 kg)  01/01/24 217 lb 6.4 oz (98.6 kg)    GEN: Well nourished, well developed in no acute distress NECK: No JVD; No carotid bruits CARDIAC: RRR, no murmurs, rubs, gallops RESPIRATORY:  Clear to auscultation without rales, wheezing or rhonchi  ABDOMEN: Soft, non-tender, non-distended EXTREMITIES:  No edema; No acute deformity     Asessement and Plan:.    CAD: Coronary CTA on 04/22/2024  indicated coronary calcium  score of 987, this was 97 percentile for age, sex and race matched control, she had mild to moderate mixed density plaque in the proximal RCA at 40 to 50%, mild mixed density plaque in the  proximal LAD and OM1 at 25 to 49%, minimal plaque in the LCx at less than 25%.  FFR analysis with no significant stenosis. Today she reports that she has occasional chest tightness lasting a few seconds in setting of increased fatigue, denies any increased shortness of breath.  Question if she is possibly having episodes of atrial fibrillation given increased fatigue and chest tightness or if related to her not using her oxygen overnight.  Patient agreeable to 2-week non-live Zio as noted below.  She politely declined pulmonology referral.  Encouraged patient to use her oxygen overnight.  Reviewed ED precautions.  Continue Eliquis  5 mg twice daily, Jardiance 10 mg daily, Lasix  40 mg daily, Crestor  40 mg daily.  Chronic diastolic HF: Echocardiogram on 05/20/2024 indicated LVEF 55 to 60%, mild concentric LVH, G2 DD, RV systolic function and size is normal, LA was mildly dilated, mild mitral valve regurgitation with no evidence of stenosis, mild calcification of the aortic valve with no stenosis present, aortic valve regurgitation was mild to moderate. Today she reports that her breathing is stable, she appears euvolemic and well compensated on exam.  Continue Lasix  40 mg daily.  Hypertension: Blood pressure today 110/62.  Continue current antihypertensive regimen.  PAF/hypercoagulable state: Patient denies any recent palpitations or feeling of atrial fibrillation. Patient is no longer on beta-blocker or amiodarone  due to bradycardia.  Heart rate today 60 bpm, maintaining rate and rhythm regular on auscultation.  Given episodes of chest tightness and reassuring ischemic evaluation patient agreeable to 2-week cardiac monitor to reassess A-fib burden. She denies any dizziness, lightheadedness, presyncope or syncope. Patient denies any bleeding problems on Eliquis . Continue Eliquis  5 mg twice daily.   Hyperlipidemia: Patient reports that she recently had lab work completed at her PCPs office, request from her  PCP.  Ascending aortic aneurysm: Patient with 5 cm with a chronic intramural hematoma along the right lateral ascending aortic wall.  She follows with Dr. Lucas, last seen in 07/2023, noted to have significant interval improvement in previously seen intramural hematoma, noted to have almost completely resolved.  CT on 04/22/2024 indicated aortic root aneurysm of the RCC measuring up to 53 mm, ascending aorta measuring up to 40 mm, pending aortic ulceration noted in the ascending aorta in the 6 o'clock position measuring up to 1.6 m x 0.6 m, similar to prior study.  Noted to be stable on CT scan on 04/22/2024.  Patient to follow-up with Dr. Lucas in January.   Chronic hypoxic respiratory failure/COPD: On home supplemental oxygen. Patient currently using 2L [place] during the day, reports that she has not been using at night, encouraged to use. She her reports that her breathing has been stable.  She politely declined pulmonology referral.  Hypothyroidism: Monitored and managed per PCP.    Disposition: F/u with Dr. Mona or Suhey Radford, NP in 2-3 months   Signed, Ellery Meroney D Arrin Ishler, NP

## 2024-06-04 ENCOUNTER — Ambulatory Visit: Attending: Cardiology | Admitting: Cardiology

## 2024-06-04 ENCOUNTER — Ambulatory Visit: Attending: Cardiology

## 2024-06-04 ENCOUNTER — Encounter: Payer: Self-pay | Admitting: Cardiology

## 2024-06-04 VITALS — BP 110/62 | HR 60 | Ht 59.0 in | Wt 215.0 lb

## 2024-06-04 DIAGNOSIS — I480 Paroxysmal atrial fibrillation: Secondary | ICD-10-CM | POA: Diagnosis not present

## 2024-06-04 DIAGNOSIS — I5032 Chronic diastolic (congestive) heart failure: Secondary | ICD-10-CM

## 2024-06-04 DIAGNOSIS — I10 Essential (primary) hypertension: Secondary | ICD-10-CM | POA: Diagnosis not present

## 2024-06-04 DIAGNOSIS — I7121 Aneurysm of the ascending aorta, without rupture: Secondary | ICD-10-CM

## 2024-06-04 DIAGNOSIS — I2510 Atherosclerotic heart disease of native coronary artery without angina pectoris: Secondary | ICD-10-CM | POA: Diagnosis not present

## 2024-06-04 DIAGNOSIS — E782 Mixed hyperlipidemia: Secondary | ICD-10-CM

## 2024-06-04 NOTE — Patient Instructions (Signed)
 Medication Instructions:  Your physician recommends that you continue on your current medications as directed. Please refer to the Current Medication list given to you today.  *If you need a refill on your cardiac medications before your next appointment, please call your pharmacy*  Lab Work: NONE If you have labs (blood work) drawn today and your tests are completely normal, you will receive your results only by: MyChart Message (if you have MyChart) OR A paper copy in the mail If you have any lab test that is abnormal or we need to change your treatment, we will call you to review the results.  Testing/Procedures: NONE  Follow-Up: At [facility], you and your health needs are our priority.  As part of our continuing mission to provide you with exceptional heart care, our providers are all part of one team.  This team includes your primary Cardiologist (physician) and Advanced Practice Providers or APPs (Physician Assistants and Nurse Practitioners) who all work together to provide you with the care you need, when you need it.  Your next appointment:   2-3 month(s)  Provider:   Vinie JAYSON Maxcy, MD    We recommend signing up for the patient portal called MyChart.  Sign up information is provided on this After Visit Summary.  MyChart is used to connect with patients for Virtual Visits (Telemedicine).  Patients are able to view lab/test results, encounter notes, upcoming appointments, etc.  Non-urgent messages can be sent to your provider as well.   To learn more about what you can do with MyChart, go to [email or web address].   Other Instructions ZIO XT- Long Term Monitor Instructions  Your physician has requested you wear a ZIO patch monitor for __14__ days.   This is a single patch monitor. [employer] supplies one patch monitor per enrollment. Additional  stickers are not available. Please do not apply patch if you will be having a Nuclear Stress Test,  Echocardiogram,  Cardiac CT, MRI, or Chest Xray during the period you would be wearing the  monitor. The patch cannot be worn during these tests. You cannot remove and re-apply the  ZIO XT patch monitor.   Your ZIO patch monitor will be mailed 3 day USPS to your address on file. It may take 3-5 days  to receive your monitor after you have been enrolled.   Once you have received your monitor, please review the enclosed instructions. Your monitor  has already been registered assigning a specific monitor serial # to you.     Billing and Patient Assistance Program Information  We have supplied [employer] with any of your insurance information on file for billing purposes.  [employer] offers a sliding scale Patient Assistance Program for patients that do not have  insurance, or whose insurance does not completely cover the cost of the ZIO monitor.  You must apply for the Patient Assistance Program to qualify for this discounted rate.   To apply, please call [employer] at [phone], select option 4, select option 2, ask to apply for  Patient Assistance Program. Meredeth will ask your household income, and how many people  are in your household. They will quote your out-of-pocket cost based on that information.  [employer] will also be able to set up a 95-month, interest-free payment plan if needed.     Applying the monitor  Shave hair from upper left chest.  Hold abrader disc by orange tab. Rub abrader in 40 strokes over the upper left chest as  indicated in your monitor  instructions.  Clean area with 4 enclosed alcohol pads. Let dry.  Apply patch as indicated in monitor instructions. Patch will be placed under collarbone on left  side of chest with arrow pointing upward.  Rub patch adhesive wings for 2 minutes. Remove white label marked 1. Remove the white  label marked 2. Rub patch adhesive wings for 2 additional minutes.  While looking in a mirror, press and release button in center of patch. A small green light  will  flash 3-4 times. This will be your only indicator that the monitor has been turned on.  Do not shower for the first 24 hours. You may shower after the first 24 hours.  Press the button if you feel a symptom. You will hear a small click. Record Date, Time and  Symptom in the Patient Logbook.  When you are ready to remove the patch, follow instructions on the last 2 pages of Patient  Logbook. Stick patch monitor onto the last page of Patient Logbook.   Place Patient Logbook in the blue and white box. Use locking tab on box and tape box closed  securely. The blue and white box has prepaid postage on it. Please place it in the mailbox as  soon as possible. Your physician should have your test results approximately 7 days after the  monitor has been mailed back to [facility].   Call [facility] Customer Care at [phone] if you have questions regarding  your ZIO XT patch monitor. Call them immediately if you see an orange light blinking on your  monitor.   If your monitor falls off in less than 4 days, contact our Monitor department at [phone].   If your monitor becomes loose or falls off after 4 days call [employer] at [phone] for  suggestions on securing your monitor.

## 2024-06-04 NOTE — Progress Notes (Unsigned)
 Enrolled for [employer] to mail a ZIO XT long term holter monitor to the patients address on file.   Dr. Rennis Golden to read.

## 2024-06-14 ENCOUNTER — Other Ambulatory Visit: Payer: Self-pay | Admitting: Surgery

## 2024-06-14 DIAGNOSIS — I712 Thoracic aortic aneurysm, without rupture, unspecified: Secondary | ICD-10-CM

## 2024-06-14 DIAGNOSIS — I7101 Dissection of ascending aorta: Secondary | ICD-10-CM

## 2024-06-15 ENCOUNTER — Encounter (HOSPITAL_COMMUNITY): Payer: Self-pay

## 2024-06-15 ENCOUNTER — Emergency Department (HOSPITAL_COMMUNITY)

## 2024-06-15 ENCOUNTER — Inpatient Hospital Stay (HOSPITAL_COMMUNITY)
Admission: EM | Admit: 2024-06-15 | Discharge: 2024-06-26 | DRG: 682 | Disposition: A | Discharge status: Home Health | Attending: Family Medicine | Admitting: Family Medicine

## 2024-06-15 ENCOUNTER — Other Ambulatory Visit: Payer: Self-pay

## 2024-06-15 DIAGNOSIS — J449 Chronic obstructive pulmonary disease, unspecified: Secondary | ICD-10-CM | POA: Insufficient documentation

## 2024-06-15 DIAGNOSIS — F32A Depression, unspecified: Secondary | ICD-10-CM | POA: Diagnosis present

## 2024-06-15 DIAGNOSIS — I482 Chronic atrial fibrillation, unspecified: Secondary | ICD-10-CM | POA: Diagnosis present

## 2024-06-15 DIAGNOSIS — Z86718 Personal history of other venous thrombosis and embolism: Secondary | ICD-10-CM

## 2024-06-15 DIAGNOSIS — I2510 Atherosclerotic heart disease of native coronary artery without angina pectoris: Secondary | ICD-10-CM | POA: Diagnosis present

## 2024-06-15 DIAGNOSIS — E871 Hypo-osmolality and hyponatremia: Secondary | ICD-10-CM | POA: Diagnosis not present

## 2024-06-15 DIAGNOSIS — Z7901 Long term (current) use of anticoagulants: Secondary | ICD-10-CM | POA: Diagnosis not present

## 2024-06-15 DIAGNOSIS — N179 Acute kidney failure, unspecified: Principal | ICD-10-CM | POA: Diagnosis present

## 2024-06-15 DIAGNOSIS — I5043 Acute on chronic combined systolic (congestive) and diastolic (congestive) heart failure: Secondary | ICD-10-CM | POA: Diagnosis not present

## 2024-06-15 DIAGNOSIS — J441 Chronic obstructive pulmonary disease with (acute) exacerbation: Secondary | ICD-10-CM | POA: Diagnosis not present

## 2024-06-15 DIAGNOSIS — J101 Influenza due to other identified influenza virus with other respiratory manifestations: Secondary | ICD-10-CM | POA: Diagnosis not present

## 2024-06-15 DIAGNOSIS — I4821 Permanent atrial fibrillation: Secondary | ICD-10-CM | POA: Diagnosis present

## 2024-06-15 DIAGNOSIS — R0609 Other forms of dyspnea: Secondary | ICD-10-CM | POA: Diagnosis not present

## 2024-06-15 DIAGNOSIS — I7121 Aneurysm of the ascending aorta, without rupture: Secondary | ICD-10-CM | POA: Diagnosis present

## 2024-06-15 DIAGNOSIS — F419 Anxiety disorder, unspecified: Secondary | ICD-10-CM | POA: Diagnosis present

## 2024-06-15 DIAGNOSIS — Z6841 Body Mass Index (BMI) 40.0 and over, adult: Secondary | ICD-10-CM

## 2024-06-15 DIAGNOSIS — R579 Shock, unspecified: Secondary | ICD-10-CM | POA: Diagnosis not present

## 2024-06-15 DIAGNOSIS — B952 Enterococcus as the cause of diseases classified elsewhere: Secondary | ICD-10-CM | POA: Diagnosis not present

## 2024-06-15 DIAGNOSIS — E872 Acidosis, unspecified: Secondary | ICD-10-CM | POA: Diagnosis not present

## 2024-06-15 DIAGNOSIS — Z66 Do not resuscitate: Secondary | ICD-10-CM | POA: Diagnosis present

## 2024-06-15 DIAGNOSIS — I5032 Chronic diastolic (congestive) heart failure: Secondary | ICD-10-CM | POA: Diagnosis present

## 2024-06-15 DIAGNOSIS — I712 Thoracic aortic aneurysm, without rupture, unspecified: Secondary | ICD-10-CM | POA: Diagnosis not present

## 2024-06-15 DIAGNOSIS — N12 Tubulo-interstitial nephritis, not specified as acute or chronic: Secondary | ICD-10-CM | POA: Diagnosis present

## 2024-06-15 DIAGNOSIS — N189 Chronic kidney disease, unspecified: Secondary | ICD-10-CM | POA: Diagnosis not present

## 2024-06-15 DIAGNOSIS — N210 Calculus in bladder: Secondary | ICD-10-CM | POA: Diagnosis not present

## 2024-06-15 DIAGNOSIS — Z8614 Personal history of Methicillin resistant Staphylococcus aureus infection: Secondary | ICD-10-CM

## 2024-06-15 DIAGNOSIS — I4819 Other persistent atrial fibrillation: Secondary | ICD-10-CM | POA: Diagnosis not present

## 2024-06-15 DIAGNOSIS — R531 Weakness: Secondary | ICD-10-CM | POA: Diagnosis not present

## 2024-06-15 DIAGNOSIS — H543 Unqualified visual loss, both eyes: Secondary | ICD-10-CM | POA: Diagnosis present

## 2024-06-15 DIAGNOSIS — K560 Paralytic ileus: Secondary | ICD-10-CM | POA: Diagnosis not present

## 2024-06-15 DIAGNOSIS — I4891 Unspecified atrial fibrillation: Secondary | ICD-10-CM | POA: Diagnosis not present

## 2024-06-15 DIAGNOSIS — Z79890 Hormone replacement therapy (postmenopausal): Secondary | ICD-10-CM

## 2024-06-15 DIAGNOSIS — I5023 Acute on chronic systolic (congestive) heart failure: Secondary | ICD-10-CM | POA: Diagnosis not present

## 2024-06-15 DIAGNOSIS — J9621 Acute and chronic respiratory failure with hypoxia: Secondary | ICD-10-CM | POA: Diagnosis not present

## 2024-06-15 DIAGNOSIS — R7401 Elevation of levels of liver transaminase levels: Secondary | ICD-10-CM | POA: Diagnosis not present

## 2024-06-15 DIAGNOSIS — I5021 Acute systolic (congestive) heart failure: Secondary | ICD-10-CM | POA: Diagnosis not present

## 2024-06-15 DIAGNOSIS — Z90722 Acquired absence of ovaries, bilateral: Secondary | ICD-10-CM

## 2024-06-15 DIAGNOSIS — E039 Hypothyroidism, unspecified: Secondary | ICD-10-CM | POA: Diagnosis present

## 2024-06-15 DIAGNOSIS — I130 Hypertensive heart and chronic kidney disease with heart failure and stage 1 through stage 4 chronic kidney disease, or unspecified chronic kidney disease: Secondary | ICD-10-CM | POA: Diagnosis present

## 2024-06-15 DIAGNOSIS — R6521 Severe sepsis with septic shock: Secondary | ICD-10-CM | POA: Diagnosis not present

## 2024-06-15 DIAGNOSIS — I739 Peripheral vascular disease, unspecified: Secondary | ICD-10-CM | POA: Diagnosis present

## 2024-06-15 DIAGNOSIS — I5020 Unspecified systolic (congestive) heart failure: Secondary | ICD-10-CM | POA: Diagnosis not present

## 2024-06-15 DIAGNOSIS — I7102 Dissection of abdominal aorta: Secondary | ICD-10-CM | POA: Diagnosis present

## 2024-06-15 DIAGNOSIS — I5033 Acute on chronic diastolic (congestive) heart failure: Secondary | ICD-10-CM | POA: Diagnosis present

## 2024-06-15 DIAGNOSIS — L304 Erythema intertrigo: Secondary | ICD-10-CM | POA: Diagnosis present

## 2024-06-15 DIAGNOSIS — I5181 Takotsubo syndrome: Secondary | ICD-10-CM | POA: Diagnosis not present

## 2024-06-15 DIAGNOSIS — K219 Gastro-esophageal reflux disease without esophagitis: Secondary | ICD-10-CM | POA: Diagnosis present

## 2024-06-15 DIAGNOSIS — A419 Sepsis, unspecified organism: Secondary | ICD-10-CM | POA: Diagnosis not present

## 2024-06-15 DIAGNOSIS — Z79899 Other long term (current) drug therapy: Secondary | ICD-10-CM

## 2024-06-15 DIAGNOSIS — E785 Hyperlipidemia, unspecified: Secondary | ICD-10-CM | POA: Diagnosis present

## 2024-06-15 DIAGNOSIS — N320 Bladder-neck obstruction: Secondary | ICD-10-CM | POA: Diagnosis present

## 2024-06-15 DIAGNOSIS — Z888 Allergy status to other drugs, medicaments and biological substances status: Secondary | ICD-10-CM

## 2024-06-15 DIAGNOSIS — Z90710 Acquired absence of both cervix and uterus: Secondary | ICD-10-CM

## 2024-06-15 DIAGNOSIS — R7989 Other specified abnormal findings of blood chemistry: Secondary | ICD-10-CM | POA: Diagnosis not present

## 2024-06-15 DIAGNOSIS — I110 Hypertensive heart disease with heart failure: Secondary | ICD-10-CM | POA: Diagnosis not present

## 2024-06-15 DIAGNOSIS — Z96641 Presence of right artificial hip joint: Secondary | ICD-10-CM | POA: Diagnosis present

## 2024-06-15 DIAGNOSIS — H409 Unspecified glaucoma: Secondary | ICD-10-CM | POA: Diagnosis present

## 2024-06-15 DIAGNOSIS — Z9884 Bariatric surgery status: Secondary | ICD-10-CM

## 2024-06-15 DIAGNOSIS — Z9079 Acquired absence of other genital organ(s): Secondary | ICD-10-CM

## 2024-06-15 DIAGNOSIS — I10 Essential (primary) hypertension: Secondary | ICD-10-CM | POA: Diagnosis present

## 2024-06-15 DIAGNOSIS — J1000 Influenza due to other identified influenza virus with unspecified type of pneumonia: Secondary | ICD-10-CM | POA: Diagnosis not present

## 2024-06-15 DIAGNOSIS — N1831 Chronic kidney disease, stage 3a: Secondary | ICD-10-CM | POA: Diagnosis present

## 2024-06-15 DIAGNOSIS — Z515 Encounter for palliative care: Secondary | ICD-10-CM | POA: Diagnosis not present

## 2024-06-15 DIAGNOSIS — Z96653 Presence of artificial knee joint, bilateral: Secondary | ICD-10-CM | POA: Diagnosis present

## 2024-06-15 DIAGNOSIS — R6520 Severe sepsis without septic shock: Secondary | ICD-10-CM | POA: Diagnosis not present

## 2024-06-15 DIAGNOSIS — J690 Pneumonitis due to inhalation of food and vomit: Secondary | ICD-10-CM | POA: Diagnosis not present

## 2024-06-15 DIAGNOSIS — Z7984 Long term (current) use of oral hypoglycemic drugs: Secondary | ICD-10-CM

## 2024-06-15 DIAGNOSIS — Z9981 Dependence on supplemental oxygen: Secondary | ICD-10-CM

## 2024-06-15 DIAGNOSIS — Z8249 Family history of ischemic heart disease and other diseases of the circulatory system: Secondary | ICD-10-CM

## 2024-06-15 DIAGNOSIS — Z818 Family history of other mental and behavioral disorders: Secondary | ICD-10-CM

## 2024-06-15 LAB — URINALYSIS, ROUTINE W REFLEX MICROSCOPIC
Bilirubin Urine: NEGATIVE
Glucose, UA: >=500 mg/dL — AB
Ketones, ur: 5 mg/dL — AB
Nitrite: POSITIVE — AB
Protein, ur: 100 mg/dL — AB
Specific Gravity, Urine: 1.018 (ref 1.005–1.030)
WBC, UA: >50 WBC/hpf (ref 0–5)
pH: 6 (ref 5.0–8.0)

## 2024-06-15 LAB — CBC WITH DIFFERENTIAL/PLATELET
Abs Immature Granulocytes: 0.2 K/uL — ABNORMAL HIGH (ref 0.00–0.07)
Basophils Absolute: 0.1 K/uL (ref 0.0–0.1)
Basophils Relative: 0 %
Eosinophils Absolute: 0 K/uL (ref 0.0–0.5)
Eosinophils Relative: 0 %
HCT: 41.5 % (ref 36.0–46.0)
Hemoglobin: 13.2 g/dL (ref 12.0–15.0)
Immature Granulocytes: 1 %
Lymphocytes Relative: 2 %
Lymphs Abs: 0.4 K/uL — ABNORMAL LOW (ref 0.7–4.0)
MCH: 29.9 pg (ref 26.0–34.0)
MCHC: 31.8 g/dL (ref 30.0–36.0)
MCV: 93.9 fL (ref 80.0–100.0)
Monocytes Absolute: 2.3 K/uL — ABNORMAL HIGH (ref 0.1–1.0)
Monocytes Relative: 11 %
Neutro Abs: 18.6 K/uL — ABNORMAL HIGH (ref 1.7–7.7)
Neutrophils Relative %: 86 %
Platelets: 251 K/uL (ref 150–400)
RBC: 4.42 MIL/uL (ref 3.87–5.11)
RDW: 14.6 % (ref 11.5–15.5)
WBC: 21.6 K/uL — ABNORMAL HIGH (ref 4.0–10.5)
nRBC: 0 % (ref 0.0–0.2)

## 2024-06-15 LAB — COMPREHENSIVE METABOLIC PANEL WITH GFR
ALT: 41 U/L (ref 0–44)
AST: 51 U/L — ABNORMAL HIGH (ref 15–41)
Albumin: 2.5 g/dL — ABNORMAL LOW (ref 3.5–5.0)
Alkaline Phosphatase: 80 U/L (ref 38–126)
Anion gap: 11 (ref 5–15)
BUN: 35 mg/dL — ABNORMAL HIGH (ref 8–23)
CO2: 27 mmol/L (ref 22–32)
Calcium: 8.7 mg/dL — ABNORMAL LOW (ref 8.9–10.3)
Chloride: 98 mmol/L (ref 98–111)
Creatinine, Ser: 2.56 mg/dL — ABNORMAL HIGH (ref 0.44–1.00)
GFR, Estimated: 20 mL/min — ABNORMAL LOW (ref >60)
Glucose, Bld: 152 mg/dL — ABNORMAL HIGH (ref 70–99)
Potassium: 3.4 mmol/L — ABNORMAL LOW (ref 3.5–5.1)
Sodium: 136 mmol/L (ref 135–145)
Total Bilirubin: 1.2 mg/dL (ref 0.0–1.2)
Total Protein: 6.7 g/dL (ref 6.5–8.1)

## 2024-06-15 LAB — SODIUM, URINE, RANDOM: Sodium, Ur: 36 mmol/L

## 2024-06-15 LAB — CREATININE, URINE, RANDOM: Creatinine, Urine: 77 mg/dL

## 2024-06-15 MED ORDER — ROSUVASTATIN CALCIUM 20 MG PO TABS: 40.0000 mg | ORAL_TABLET | Freq: Every day | ORAL | Status: DC

## 2024-06-15 MED ORDER — ACETAMINOPHEN 650 MG RE SUPP: 650.0000 mg | Freq: Four times a day (QID) | RECTAL | Status: DC | PRN

## 2024-06-15 MED ORDER — ACETAMINOPHEN 325 MG PO TABS
650.0000 mg | ORAL_TABLET | Freq: Four times a day (QID) | ORAL | Status: DC | PRN
  Administered 2024-06-16 – 2024-06-26 (×4): 650 mg via ORAL
  Filled 2024-06-15 (×4): qty 2

## 2024-06-15 MED ORDER — GERHARDT'S BUTT CREAM
TOPICAL_CREAM | Freq: Three times a day (TID) | CUTANEOUS | Status: DC
  Administered 2024-06-19: 22:00:00 1 via TOPICAL
  Filled 2024-06-15 (×2): qty 60

## 2024-06-15 MED ORDER — SENNA 8.6 MG PO TABS
1.0000 | ORAL_TABLET | Freq: Every day | ORAL | Status: DC | PRN
  Administered 2024-06-17: 21:00:00 8.6 mg via ORAL
  Filled 2024-06-15: qty 1

## 2024-06-15 MED ORDER — SODIUM CHLORIDE 0.9 % IV SOLN: INTRAVENOUS | Status: DC

## 2024-06-15 MED ORDER — TIMOLOL MALEATE 0.5 % OP SOLN
1.0000 [drp] | Freq: Two times a day (BID) | OPHTHALMIC | Status: DC
  Administered 2024-06-15 – 2024-06-26 (×23): 1 [drp] via OPHTHALMIC
  Filled 2024-06-15: qty 5

## 2024-06-15 MED ORDER — APIXABAN 5 MG PO TABS
5.0000 mg | ORAL_TABLET | Freq: Two times a day (BID) | ORAL | Status: DC
  Administered 2024-06-15 – 2024-06-26 (×23): 5 mg via ORAL
  Filled 2024-06-15 (×23): qty 1

## 2024-06-15 MED ORDER — LEVOTHYROXINE SODIUM 112 MCG PO TABS
112.0000 ug | ORAL_TABLET | Freq: Every day | ORAL | Status: DC
  Administered 2024-06-15 – 2024-06-26 (×12): 112 ug via ORAL
  Filled 2024-06-15 (×12): qty 1

## 2024-06-15 MED ORDER — OXYCODONE HCL 5 MG PO TABS
5.0000 mg | ORAL_TABLET | ORAL | Status: DC | PRN
  Administered 2024-06-15 (×2): 5 mg via ORAL
  Administered 2024-06-15: 10 mg via ORAL
  Administered 2024-06-16 (×2): 5 mg via ORAL
  Administered 2024-06-17 – 2024-06-19 (×4): 10 mg via ORAL
  Administered 2024-06-19: 22:00:00 5 mg via ORAL
  Administered 2024-06-20 – 2024-06-21 (×3): 10 mg via ORAL
  Administered 2024-06-22 – 2024-06-24 (×3): 5 mg via ORAL
  Filled 2024-06-15: qty 1
  Filled 2024-06-15: qty 2
  Filled 2024-06-15 (×2): qty 1
  Filled 2024-06-15 (×4): qty 2
  Filled 2024-06-15: qty 1
  Filled 2024-06-15 (×2): qty 2
  Filled 2024-06-15: qty 1
  Filled 2024-06-15: qty 2
  Filled 2024-06-15: qty 1
  Filled 2024-06-15: qty 2
  Filled 2024-06-15 (×2): qty 1

## 2024-06-15 MED ORDER — PROCHLORPERAZINE EDISYLATE 10 MG/2ML IJ SOLN
5.0000 mg | Freq: Four times a day (QID) | INTRAMUSCULAR | Status: DC | PRN
  Administered 2024-06-15 – 2024-06-18 (×3): 5 mg via INTRAVENOUS
  Filled 2024-06-15 (×3): qty 2

## 2024-06-15 MED ORDER — SODIUM CHLORIDE 0.9% FLUSH
3.0000 mL | Freq: Two times a day (BID) | INTRAVENOUS | Status: DC
  Administered 2024-06-15 – 2024-06-26 (×18): 3 mL via INTRAVENOUS

## 2024-06-15 MED ORDER — SODIUM CHLORIDE 0.9 % IV SOLN
1.0000 g | INTRAVENOUS | Status: AC
  Administered 2024-06-15 – 2024-06-21 (×7): 1 g via INTRAVENOUS
  Filled 2024-06-15 (×7): qty 10

## 2024-06-15 MED ORDER — SODIUM CHLORIDE 0.9 % IV SOLN
1.0000 g | Freq: Once | INTRAVENOUS | Status: AC
  Administered 2024-06-15: 1 g via INTRAVENOUS
  Filled 2024-06-15: qty 10

## 2024-06-15 MED ORDER — POTASSIUM CHLORIDE CRYS ER 20 MEQ PO TBCR
20.0000 meq | EXTENDED_RELEASE_TABLET | Freq: Once | ORAL | Status: AC
  Administered 2024-06-15: 20 meq via ORAL
  Filled 2024-06-15: qty 1

## 2024-06-15 MED ORDER — BRIMONIDINE TARTRATE 0.2 % OP SOLN
1.0000 [drp] | Freq: Two times a day (BID) | OPHTHALMIC | Status: DC
  Administered 2024-06-15 – 2024-06-26 (×22): 1 [drp] via OPHTHALMIC
  Filled 2024-06-15: qty 5

## 2024-06-15 MED ORDER — BRIMONIDINE TARTRATE-TIMOLOL 0.2-0.5 % OP SOLN: 1.0000 [drp] | Freq: Two times a day (BID) | OPHTHALMIC | Status: DC

## 2024-06-15 MED ORDER — LACTATED RINGERS IV BOLUS
500.0000 mL | Freq: Once | INTRAVENOUS | Status: AC
  Administered 2024-06-15: 500 mL via INTRAVENOUS

## 2024-06-15 MED ORDER — FLUOXETINE HCL 20 MG PO CAPS
40.0000 mg | ORAL_CAPSULE | Freq: Every day | ORAL | Status: DC
  Administered 2024-06-15 – 2024-06-26 (×12): 40 mg via ORAL
  Filled 2024-06-15 (×12): qty 2

## 2024-06-15 NOTE — Plan of Care (Signed)
   Problem: Activity: Goal: Risk for activity intolerance will decrease Outcome: Progressing   Problem: Nutrition: Goal: Adequate nutrition will be maintained Outcome: Progressing   Problem: Elimination: Goal: Will not experience complications related to bowel motility Outcome: Progressing Goal: Will not experience complications related to urinary retention Outcome: Progressing

## 2024-06-15 NOTE — Plan of Care (Signed)

## 2024-06-15 NOTE — Progress Notes (Signed)
 68 year old white female Permanent A-fib HFpEF last EF 55-60%-coronary CT 04/22/2024 calcium  score 987-this was done for continued chest pressure Morbid obesity with gastric sleeve with current BMI 46 Previous aortic dissection right lateral ascending aortic wall 11/2022 not a candidate for surgical resection plan was for palliative care-echo showed moderate dilatation of aortic root 44 mm mild dilatation of ascending aorta 43 mm--- seen by Dr. Sherrine 07/2023 and was to follow in January 2026 with stable CT scan per notes 04/22/2024  Hospitalized 01/21/2023 with heart failure A-fib RVR  12/2 cardiology visit because of chest pain?  Palpitations was told to use a Zio patch and declined pulmonary referral was told to continue all meds including Eliquis  Jardiance Lasix  Crestor   2-week history of dysuria nausea loss of appetite WBC 21,000 renal ultrasound partially atrophic kidney no acute findings started Rocephin  IV fluid in ED SUBJECTIVE:  ?beliefs that all the contrast that she took recently for liver and kidney scans by her PCP Dr. Maree caused her UTI?--- Looks like an MRI was performed November 20 which shows no significant hepatic findings or hepatic lesions and right kidney atrophy only  Feels okay right now a little nausea but thinks that she can eat No chest pain or fluttering   OBJECTIVE: BP (!) 120/57 (BP Location: Right Arm)   Pulse 69   Temp 99 F (37.2 C) (Oral)   Resp 18   Ht 4' 8 (1.422 m)   Wt 93.9 kg   SpO2 97%   BMI 46.41 kg/m  Thick neck Mallampati 4, strabismus Neck soft supple Chest is clear no wheeze rales rhonchi Abdomen obese nontender No lower extremity edema  ASSESSMENT:   PLAN: AKI superimposed on CKD 3A-baseline creatinine 1.2 Rationale explained for IV fluid-continue saline as below Discontinue forever Jardiance 10 Reimplement eventually Lasix  40 daily maybe as an outpatient  Left pyelonephritis-s Continue on saline 125 cc/H for 48 hours and then  discontinue-follow urine/blood cultures --- apparently not ordered by ED? Continue ceftriaxone -1 g every 24 3 to 5 days expected Precipitant likely Jardiance-would not use SGLT2 again in the future  HFpEF last echo 55-60% See above discussion Holding Crestor  until discharge  Morbid obesity with gastric sleeve in the past Follow as outpatient  Recent chest pain supposed to be on Zio patch Continues to wear Zio patch which will need to be looked at again by cardiology-CCM Meds adjusted as per above  Mood disorder Continue Prozac  40  Hypothyroid   No charge  Reggie Grimes, MD [facility] 8:17 AM

## 2024-06-15 NOTE — ED Provider Notes (Signed)
 [facility] DEPARTMENT AT [facility] Provider Note   CSN: [id number] Arrival date & time: 06/15/24  9964     Patient presents with: Urinary Frequency   Lauren Clark is a 68 y.o. female.   68 year old female with a history of hypertension, hyperlipidemia, aortic dissection, hypothyroid, morbid obesity presents to the emergency department for complaint of burning dysuria.  She has been experiencing symptoms since her contrast MRI on 05/28/2024.  She feels like the contrast gave her a urinary tract infection.  She has urinary frequency at baseline due to use of a diuretic.  Denies any change or worsening to this.  Actually feels that she has urinated less frequently today.  Began experiencing pain in her left side and back yesterday.  She has not had any fevers, vomiting, bowel changes.  Code status: DNR/DNI  The history is provided by the patient. No language interpreter was used.  Urinary Frequency       Prior to Admission medications  Medication Sig Start Date End Date Taking? Authorizing Provider  brimonidine -timolol  (COMBIGAN ) 0.2-0.5 % ophthalmic solution Place 1 drop into the right eye 2 (two) times daily. 06/10/24  Yes [provider]  acetaminophen -codeine (TYLENOL  #4) 300-60 MG tablet Take 1 tablet by mouth every 8 (eight) hours as needed for moderate pain (pain score 4-6).    [provider]  B Complex-C (B-COMPLEX WITH VITAMIN C) tablet Take 1 tablet by mouth daily.    [provider]  BELSOMRA 10 MG TABS Take 1 tablet by mouth at bedtime. 11/03/23   [provider]  CALCIUM  CITRATE PO Take 600 mg by mouth daily.    [provider]  Cyanocobalamin (VITAMIN B-12) 2500 MCG SUBL Place 2,500 mcg under the tongue daily.    [provider]  ELIQUIS  5 MG TABS tablet TAKE 1 TABLET(5 MG) BY MOUTH TWICE DAILY 02/09/24   Hilty, Vinie BROCKS, MD  empagliflozin (JARDIANCE) 10 MG TABS tablet Take 10 mg by mouth daily.     [provider]  FLUoxetine  (PROZAC ) 40 MG capsule Take 40 mg by mouth daily. 08/29/22   [provider]  furosemide  (LASIX ) 40 MG tablet Take 1 tablet (40 mg total) by mouth daily. 04/08/24   Hilty, Vinie BROCKS, MD  latanoprost  (XALATAN ) 0.005 % ophthalmic solution Place 1 drop into the right eye at bedtime.     [provider]  levothyroxine  (SYNTHROID ) 112 MCG tablet Take 112 mcg by mouth every morning. 09/08/23   [provider]  magnesium  oxide (MAG-OX) 400 (240 Mg) MG tablet Take 400 mg by mouth 2 (two) times daily.    [provider]  NON FORMULARY Cranberry conascorib acid 300-100mg     [provider]  Omega 3-6-9 Fatty Acids (TRIPLE OMEGA-3-6-9 PO) Take 400 mg by mouth daily.    [provider]  rosuvastatin  (CRESTOR ) 40 MG tablet Take 40 mg by mouth at bedtime. 08/01/23   [provider]  Skin Protectants, Misc. (MINERIN CREME EX) Apply 1 application  topically daily.    [provider]  traZODone  (DESYREL ) 50 MG tablet TAKE 1 TABLET BY MOUTH DAILY AT BEDTIME AS NEEDED FOR INSOMNIA    [provider]    Allergies: Crestor  [rosuvastatin  calcium ], Lipitor [atorvastatin], and Hctz [hydrochlorothiazide]    Review of Systems  Genitourinary:  Positive for frequency.  Ten systems reviewed and are negative for acute change, except as noted in the HPI.   Updated Vital Signs BP (!) 120/57 (BP  Location: Right Arm)   Pulse 69   Temp 99 F (37.2 C) (Oral)   Resp 18   Ht 4' 8 (1.422 m)   Wt 93.9 kg   SpO2 97%   BMI 46.41 kg/m   Physical Exam Vitals and nursing note reviewed.  Constitutional:      General: She is not in acute distress.    Appearance: She is well-developed. She is not diaphoretic.  HENT:     Head: Normocephalic and atraumatic.  Eyes:     General: No scleral icterus.    Conjunctiva/sclera: Conjunctivae normal.  Pulmonary:     Effort: Pulmonary effort is normal. No respiratory  distress.     Comments: Respirations even and unlabored Abdominal:     Palpations: Abdomen is soft. There is no mass.     Comments: Abdomen soft, nondistended. There is TTP mildly in the left flank. No guarding or peritoneal signs.  Musculoskeletal:        General: Normal range of motion.     Cervical back: Normal range of motion.  Skin:    General: Skin is warm and dry.     Coloration: Skin is not pale.     Findings: No erythema or rash.  Neurological:     Mental Status: She is alert and oriented to person, place, and time.     Coordination: Coordination normal.  Psychiatric:        Behavior: Behavior normal.     (all labs ordered are listed, but only abnormal results are displayed) Labs Reviewed  URINALYSIS, ROUTINE W REFLEX MICROSCOPIC - Abnormal; Notable for the following components:      Result Value   Color, Urine AMBER (*)    APPearance CLOUDY (*)    Glucose, UA >=500 (*)    Hgb urine dipstick SMALL (*)    Ketones, ur 5 (*)    Protein, ur 100 (*)    Nitrite POSITIVE (*)    Leukocytes,Ua MODERATE (*)    Bacteria, UA MANY (*)    Non Squamous Epithelial 0-5 (*)    All other components within normal limits  CBC WITH DIFFERENTIAL/PLATELET - Abnormal; Notable for the following components:   WBC 21.6 (*)    Neutro Abs 18.6 (*)    Lymphs Abs 0.4 (*)    Monocytes Absolute 2.3 (*)    Abs Immature Granulocytes 0.20 (*)    All other components within normal limits  COMPREHENSIVE METABOLIC PANEL WITH GFR - Abnormal; Notable for the following components:   Potassium 3.4 (*)    Glucose, Bld 152 (*)    BUN 35 (*)    Creatinine, Ser 2.56 (*)    Calcium  8.7 (*)    Albumin 2.5 (*)    AST 51 (*)    GFR, Estimated 20 (*)    All other components within normal limits  URINE CULTURE    EKG: None  Radiology: US  Renal Result Date: 06/15/2024 EXAM: US  Retroperitoneum Complete, Renal. 06/15/2024 04:17:30 AM TECHNIQUE: Real-time ultrasonography of the retroperitoneum renal was  performed. COMPARISON: CTA chest abdomen and pelvis on 11/18/2022. CLINICAL HISTORY: 68 year old female with flank pain. FINDINGS: FINDINGS: RIGHT KIDNEY/URETER: Right kidney measures 7.4 x 4.5 x 5.3 cm, estimated volume 92 mL. Chronically partially atrophied and demonstrates asymmetric decrease in corticomedullary differentiation (series 1 image 27). No hydronephrosis. No calculus. No mass. LEFT KIDNEY/URETER: Left kidney measures 12.1 x 6.3 x 6.4 cm, estimated volume 255 mL. Normal cortical echogenicity. No hydronephrosis. No calculus. No mass. BLADDER: Diminutive, decompressed  bladder. IMPRESSION: 1. Chronic partially atrophied right kidney.  No acute findings. Electronically signed by: Helayne Hurst MD 06/15/2024 04:33 AM EST RP Workstation: [id number]     Procedures   Medications Ordered in the ED  lactated ringers  bolus 500 mL (500 mLs Intravenous New Bag/Given 06/15/24 0238)  cefTRIAXone  (ROCEPHIN ) 1 g in sodium chloride  0.9 % 100 mL IVPB (1 g Intravenous New Bag/Given 06/15/24 0242)    Clinical Course as of 06/15/24 0511  Sat Jun 15, 2024  0203 UA c/w UTI. Given flank pain, started on IV abx of coverage of pyelonephritis. Awaiting labs. Current vital signs not suggestive of SIRS/sepsis. [KH]  [phone] Patient with acute kidney injury, likely related to urinary tract infection.  White blood cell count is 21.6. Will obtain renal ultrasound. Plan for admission. [KH]  0510 Renal ultrasound without concern for obstructive uropathy.  Case discussed with Dr. Charlton of hospitalist service who will assess the patient for admission. [KH]    Clinical Course User Index [KH] Keith Sor, PA-C                                 Medical Decision Making Amount and/or Complexity of Data Reviewed Labs: ordered. Radiology: ordered.  Risk Decision regarding hospitalization.   This patient presents to the ED for concern of dysuria, this involves an extensive number of treatment options, and is a complaint  that carries with it a high risk of complications and morbidity.  The differential diagnosis includes UTI/pyelo vs STI vs vaginitis vs intertrigo   Co morbidities that complicate the patient evaluation  HTN HLD Aortic dissection   Additional history obtained:  Additional history obtained from EMS personnel External records from outside source obtained and reviewed including MRI abdomen/pevis on 05/28/24   Lab Tests:  I Ordered, and personally interpreted labs.  The pertinent results include:  WBC 21.6, Creatinine 2.56 (baseline ~1.2), UA c/w UTI (bacteriuria, pyuria, nitrite positive)   Imaging Studies ordered:  I ordered imaging studies including US  renal  I independently visualized and interpreted imaging which showed chronically atrophied right kidney without acute findings I agree with the radiologist interpretation   Cardiac Monitoring:  The patient was maintained on a cardiac monitor.  I personally viewed and interpreted the cardiac monitored which showed an underlying rhythm of: NSR   Medicines ordered and prescription drug management:  I ordered medication including Rocephin  for UTI  Reevaluation of the patient after these medicines showed that the patient remained stable I have reviewed the patients home medicines and have made adjustments as needed   Test Considered:  CT renal study   Problem List / ED Course:  As above   Reevaluation:  After the interventions noted above, I reevaluated the patient and found that they have :stayed the same   Social Determinants of Health:  Lives independently   Dispostion:  After consideration of the diagnostic results and the patients response to treatment, I feel that the patent would benefit from admission for management of clinical pyelonephritis with associated AKI. Started on IV abx and given IVF hydration. Dr. Charlton of [facility] to assess for admission.       Final diagnoses:  Pyelonephritis  AKI (acute  kidney injury)    ED Discharge Orders     None          Keith Sor, PA-C 06/15/24 9483    Haze Lonni PARAS, MD 06/16/24 [id number]

## 2024-06-15 NOTE — ED Triage Notes (Signed)
 Pt bib ems coming from home for urinary frequency and burning x 2 weeks+  Bp 96/55 on palp HR 86 O2 94RA

## 2024-06-15 NOTE — Consult Note (Signed)
 WOC Nurse Consult Note: Reason for Consult: L groin wound  Wound type:  Intertriginous dermatitis L groin/inner thigh with likely fungal component  ICD-10 CM Codes for Irritant Dermatitis   L30.4  - Erythema intertrigo. Also used for abrasion of the hand, chafing of the skin, dermatitis due to sweating and friction, friction dermatitis, friction eczema, and genital/thigh intertrigo.  Pressure Injury POA: NA not pressure  Measurement: see nursing flowsheet  Wound bed: groin with ? Satellite lesions and yeasty smell per bedside nurse  Drainage (amount, consistency, odor) see nursing flowsheet  Periwound: intact  Dressing procedure/placement/frequency: Cleanse L groin/inner thigh with soap and water , dry and apply Gerhardts Butt Cream 3 times a day and prn soiling.   POC discussed with bedside nurse. Appreciate C. Bluford, LPN assistance with this consult.   WOC team will not follow. Reconsult if further needs arise.   Thank you,    Powell Bar MSN, RN-BC, [employer]

## 2024-06-15 NOTE — H&P (Signed)
 History and Physical    Lauren Clark:[id number] DOB: 1955-11-27 DOA: 06/15/2024  PCP: [name], MD   Patient coming from: Home   Chief Complaint: Dysuria, left flank pain, nausea, loss of appetite   HPI: Lauren Clark is a 68 y.o. female with medical history significant for hypertension, hyperlipidemia, hypothyroidism, CKD 3A, COPD, chronic hypoxic respiratory failure, CAD, atrial fibrillation on Eliquis , chronic HFpEF, and ascending aortic aneurysm followed by cardiothoracic surgery who presents with dysuria and left flank pain.  Patient reports that she first developed dysuria and discomfort in the left flank approximately 2 weeks ago.  Symptoms have persisted and she has also developed nausea and loss of appetite.  She has had chills intermittently with this.  She denies any recent chest pain, shortness of breath, or significant cough.  ED Course: Upon arrival to the ED, patient is found to be afebrile and saturating well on room air with normal RR, normal HR, and stable BP.  Labs are most notable for creatinine 2.56 and WBC 21,600.  UA notable for pyuria and bacteriuria with positive nitrites.  Renal ultrasound is notable for chronic partially atrophied right kidney but no acute findings.  Urine culture was ordered and the patient was treated with 500 mL of LR and 1 g IV Rocephin  in the ED.  Review of Systems:  All other systems reviewed and apart from HPI, are negative.  Past Medical History:  Diagnosis Date   Anemia    s/p knee surgery   Anxiety    Aortic dissection (HCC)    Arthritis    COPD (chronic obstructive pulmonary disease) (HCC) 06/15/2024   Depression    GERD (gastroesophageal reflux disease)    Glaucoma    right eye uses drops   History of blood transfusion    [facility]   History of MRSA infection    Hyperlipidemia    Hypertension    Hypothyroidism    Impaired vision in both eyes    blind left eye, limited vision  right eye   Morbid obesity (HCC)    s/p Gastric sleeve- 8'17(loss thus far approx. 100 lbs)   Peripheral vascular disease    dvt right leg   Pre-diabetes    prior to gastric sleeve    Past Surgical History:  Procedure Laterality Date   ABDOMINAL HYSTERECTOMY  1981   with bilateral BSO   BREAST BIOPSY Right 10/18/2023   MM RT BREAST BX W LOC DEV 1ST LESION IMAGE BX SPEC STEREO GUIDE 10/18/2023 [facility]   CATARACT EXTRACTION Right 2010   [facility]   COLONOSCOPY     DEBRIDEMENT AND CLOSURE WOUND Right 07/07/2014   Dr. Renato   EYE SURGERY     bil cataraction with IOL as a child, cataract surgery late 1990- right   JOINT REPLACEMENT Right 2016   knee   KNEE DEBRIDEMENT Right 06/22/2014   Dr. Reyes Boer with wound vac   KNEE SURGERY     i&d KNEE POST REPLACEMENT   LAPAROSCOPIC GASTRIC SLEEVE RESECTION N/A 02/29/2016   Procedure: LAPAROSCOPIC GASTRIC SLEEVE RESECTION, UPPER ENDO;  Surgeon: Camellia Blush, MD;  Location: [facility];  Service: General;  Laterality: N/A;   TONSILLECTOMY     TOTAL HIP ARTHROPLASTY Right 06/14/2016   Procedure: RIGHT TOTAL HIP ARTHROPLASTY ANTERIOR APPROACH;  Surgeon: Donnice Car, MD;  Location: [facility];  Service: Orthopedics;  Laterality: Right;   TOTAL KNEE ARTHROPLASTY Left 09/05/2017   Procedure:  LEFT TOTAL KNEE ARTHROPLASTY;  Surgeon: Ernie Cough, MD;  Location: [facility];  Service: Orthopedics;  Laterality: Left;  Adductor Block   UPPER GI ENDOSCOPY  01/25/2016    Social History:   reports that she has never smoked. She has never used smokeless tobacco. She reports that she does not drink alcohol and does not use drugs.  Allergies[1]  Family History  Problem Relation Age of Onset   Depression Mother    Hypertension Father    Breast cancer Neg Hx      Prior to Admission medications  Medication Sig Start Date End Date Taking? Authorizing Provider  brimonidine -timolol  (COMBIGAN ) 0.2-0.5 % ophthalmic solution Place 1 drop  into the right eye 2 (two) times daily. 06/10/24  Yes [provider]  acetaminophen -codeine (TYLENOL  #4) 300-60 MG tablet Take 1 tablet by mouth every 8 (eight) hours as needed for moderate pain (pain score 4-6).    [provider]  B Complex-C (B-COMPLEX WITH VITAMIN C) tablet Take 1 tablet by mouth daily.    [provider]  BELSOMRA 10 MG TABS Take 1 tablet by mouth at bedtime. 11/03/23   [provider]  CALCIUM  CITRATE PO Take 600 mg by mouth daily.    [provider]  Cyanocobalamin (VITAMIN B-12) 2500 MCG SUBL Place 2,500 mcg under the tongue daily.    [provider]  ELIQUIS  5 MG TABS tablet TAKE 1 TABLET(5 MG) BY MOUTH TWICE DAILY 02/09/24   Hilty, Vinie BROCKS, MD  empagliflozin (JARDIANCE) 10 MG TABS tablet Take 10 mg by mouth daily.    [provider]  FLUoxetine  (PROZAC ) 40 MG capsule Take 40 mg by mouth daily. 08/29/22   [provider]  furosemide  (LASIX ) 40 MG tablet Take 1 tablet (40 mg total) by mouth daily. 04/08/24   Hilty, Vinie BROCKS, MD  latanoprost  (XALATAN ) 0.005 % ophthalmic solution Place 1 drop into the right eye at bedtime.     [provider]  levothyroxine  (SYNTHROID ) 112 MCG tablet Take 112 mcg by mouth every morning. 09/08/23   [provider]  magnesium  oxide (MAG-OX) 400 (240 Mg) MG tablet Take 400 mg by mouth 2 (two) times daily.    [provider]  NON FORMULARY Cranberry conascorib acid 300-100mg     [provider]  Omega 3-6-9 Fatty Acids (TRIPLE OMEGA-3-6-9 PO) Take 400 mg by mouth daily.    [provider]  rosuvastatin  (CRESTOR ) 40 MG tablet Take 40 mg by mouth at bedtime. 08/01/23   [provider]  Skin Protectants, Misc. (MINERIN CREME EX) Apply 1 application  topically daily.    [provider]  traZODone  (DESYREL ) 50 MG tablet TAKE 1 TABLET BY MOUTH DAILY AT BEDTIME AS NEEDED FOR INSOMNIA    [provider]    Physical  Exam: Vitals:   06/15/24 0029 06/15/24 0031  BP: (!) 120/57   Pulse: 69   Resp: 18   Temp: 99 F (37.2 C)   TempSrc: Oral   SpO2: 97%   Weight:  93.9 kg  Height:  4' 8 (1.422 m)    Constitutional: NAD, no pallor or diaphoresis   Eyes: PERTLA, lids and conjunctivae normal ENMT: Mucous membranes are moist. Posterior pharynx clear of any exudate or lesions.   Neck: supple, no masses  Respiratory: no wheezing, no crackles. No accessory muscle use.  Cardiovascular: S1 & S2 heard, regular rate and rhythm. No JVD. Abdomen: No tenderness, soft. Bowel sounds active.  Musculoskeletal: no clubbing /  cyanosis. No joint deformity upper and lower extremities.   Skin: no significant rashes, lesions, ulcers. Warm, dry, well-perfused. Neurologic: No aphasia or dysarthria. Moving all extremities. Alert and oriented to person, place, and situation.  Psychiatric: Calm. Cooperative.    Labs and Imaging on Admission: I have personally reviewed following labs and imaging studies  CBC: Recent Labs  Lab 06/15/24 0207  WBC 21.6*  NEUTROABS 18.6*  HGB 13.2  HCT 41.5  MCV 93.9  PLT 251   Basic Metabolic Panel: Recent Labs  Lab 06/15/24 0207  NA 136  K 3.4*  CL 98  CO2 27  GLUCOSE 152*  BUN 35*  CREATININE 2.56*  CALCIUM  8.7*   GFR: Estimated Creatinine Clearance: 19.4 mL/min (A) (by C-G formula based on SCr of 2.56 mg/dL (H)). Liver Function Tests: Recent Labs  Lab 06/15/24 0207  AST 51*  ALT 41  ALKPHOS 80  BILITOT 1.2  PROT 6.7  ALBUMIN 2.5*   No results for input(s): LIPASE, AMYLASE in the last 168 hours. No results for input(s): AMMONIA in the last 168 hours. Coagulation Profile: No results for input(s): INR, PROTIME in the last 168 hours. Cardiac Enzymes: No results for input(s): CKTOTAL, CKMB, CKMBINDEX, TROPONINI in the last 168 hours. BNP (last 3 results) No results for input(s): PROBNP in the last 8760 hours. HbA1C: No results for input(s):  HGBA1C in the last 72 hours. CBG: No results for input(s): GLUCAP in the last 168 hours. Lipid Profile: No results for input(s): CHOL, HDL, LDLCALC, TRIG, CHOLHDL, LDLDIRECT in the last 72 hours. Thyroid  Function Tests: No results for input(s): TSH, T4TOTAL, FREET4, T3FREE, THYROIDAB in the last 72 hours. Anemia Panel: No results for input(s): VITAMINB12, FOLATE, FERRITIN, TIBC, IRON, RETICCTPCT in the last 72 hours. Urine analysis:    Component Value Date/Time   COLORURINE AMBER (A) 06/15/2024 0143   APPEARANCEUR CLOUDY (A) 06/15/2024 0143   LABSPEC 1.018 06/15/2024 0143   PHURINE 6.0 06/15/2024 0143   GLUCOSEU >=500 (A) 06/15/2024 0143   HGBUR SMALL (A) 06/15/2024 0143   BILIRUBINUR NEGATIVE 06/15/2024 0143   KETONESUR 5 (A) 06/15/2024 0143   PROTEINUR 100 (A) 06/15/2024 0143   NITRITE POSITIVE (A) 06/15/2024 0143   LEUKOCYTESUR MODERATE (A) 06/15/2024 0143   Sepsis Labs: @LABRCNTIP (procalcitonin:4,lacticidven:4) )No results found for this or any previous visit (from the past 240 hours).   Radiological Exams on Admission: US  Renal Result Date: 06/15/2024 EXAM: US  Retroperitoneum Complete, Renal. 06/15/2024 04:17:30 AM TECHNIQUE: Real-time ultrasonography of the retroperitoneum renal was performed. COMPARISON: CTA chest abdomen and pelvis on 11/18/2022. CLINICAL HISTORY: 68 year old female with flank pain. FINDINGS: FINDINGS: RIGHT KIDNEY/URETER: Right kidney measures 7.4 x 4.5 x 5.3 cm, estimated volume 92 mL. Chronically partially atrophied and demonstrates asymmetric decrease in corticomedullary differentiation (series 1 image 27). No hydronephrosis. No calculus. No mass. LEFT KIDNEY/URETER: Left kidney measures 12.1 x 6.3 x 6.4 cm, estimated volume 255 mL. Normal cortical echogenicity. No hydronephrosis. No calculus. No mass. BLADDER: Diminutive, decompressed bladder. IMPRESSION: 1. Chronic partially atrophied right kidney.  No acute findings.  Electronically signed by: Helayne Hurst MD 06/15/2024 04:33 AM EST RP Workstation: [id number]     Assessment/Plan   1. AKI superimposed on CKD 3A  - SCr is 2.56 on admission, up from baseline closer to 1.2 in the setting of pyelonephritis and recent anorexia; no acute findings on renal US  in ED  - Hold Lasix  and Jardiance, renally-dose medications, repeat chem panel tomorrow   2. Left pyelonephritis  - Not septic on  admission  - Continue Rocephin , follow culture and clinical course    3. Atrial fibrillation  - Continue Eliquis  and diltiazem     4. Chronic HFpEF  - Appears compensated  - Hold Lasix  and Jardiance in light of AKI, monitor volume status    5. Hypothyroidism  - Continue Synthroid    6. Chronic hypoxic respiratory failure  - Currently stable on room air - Continue supplemental O2 as-needed   7. Mood disorder  - Continue Prozac      DVT prophylaxis: Eliquis   Code Status: Full  Level of Care: Level of care: Med-Surg Family Communication: None present  Disposition Plan:  Patient is from: home Anticipated d/c is to: Home  Anticipated d/c date is: 06/18/24  Patient currently: Pending improved renal function, transition to oral antibiotic  Consults called: None  Admission status: Inpatient     Evalene GORMAN Sprinkles, MD [facility]  06/15/2024, 5:34 AM       [1]  Allergies Allergen Reactions   Crestor  [Rosuvastatin  Calcium ] Other (See Comments)    Muscle aches     Lipitor [Atorvastatin] Other (See Comments)    Muscle aches    Hctz [Hydrochlorothiazide] Other (See Comments)    Pt doesn't remember

## 2024-06-16 ENCOUNTER — Inpatient Hospital Stay (HOSPITAL_COMMUNITY)

## 2024-06-16 ENCOUNTER — Encounter (HOSPITAL_COMMUNITY): Payer: Self-pay | Admitting: Family Medicine

## 2024-06-16 DIAGNOSIS — N12 Tubulo-interstitial nephritis, not specified as acute or chronic: Principal | ICD-10-CM | POA: Diagnosis present

## 2024-06-16 DIAGNOSIS — N179 Acute kidney failure, unspecified: Secondary | ICD-10-CM | POA: Diagnosis present

## 2024-06-16 LAB — BASIC METABOLIC PANEL WITH GFR
Anion gap: 14 (ref 5–15)
BUN: 40 mg/dL — ABNORMAL HIGH (ref 8–23)
CO2: 19 mmol/L — ABNORMAL LOW (ref 22–32)
Calcium: 8.4 mg/dL — ABNORMAL LOW (ref 8.9–10.3)
Chloride: 99 mmol/L (ref 98–111)
Creatinine, Ser: 3.47 mg/dL — ABNORMAL HIGH (ref 0.44–1.00)
GFR, Estimated: 14 mL/min — ABNORMAL LOW (ref >60)
Glucose, Bld: 123 mg/dL — ABNORMAL HIGH (ref 70–99)
Potassium: 3.5 mmol/L (ref 3.5–5.1)
Sodium: 132 mmol/L — ABNORMAL LOW (ref 135–145)

## 2024-06-16 LAB — CBC
HCT: 38.7 % (ref 36.0–46.0)
Hemoglobin: 12.1 g/dL (ref 12.0–15.0)
MCH: 30 pg (ref 26.0–34.0)
MCHC: 31.3 g/dL (ref 30.0–36.0)
MCV: 96 fL (ref 80.0–100.0)
Platelets: 199 K/uL (ref 150–400)
RBC: 4.03 MIL/uL (ref 3.87–5.11)
RDW: 15 % (ref 11.5–15.5)
WBC: 20.5 K/uL — ABNORMAL HIGH (ref 4.0–10.5)
nRBC: 0 % (ref 0.0–0.2)

## 2024-06-16 LAB — URINE CULTURE

## 2024-06-16 LAB — HIV ANTIBODY (ROUTINE TESTING W REFLEX): HIV Screen 4th Generation wRfx: NONREACTIVE

## 2024-06-16 LAB — HEPATIC FUNCTION PANEL
ALT: 44 U/L (ref 0–44)
AST: 49 U/L — ABNORMAL HIGH (ref 15–41)
Albumin: 2.2 g/dL — ABNORMAL LOW (ref 3.5–5.0)
Alkaline Phosphatase: 95 U/L (ref 38–126)
Bilirubin, Direct: 0.2 mg/dL (ref 0.0–0.2)
Indirect Bilirubin: 0.3 mg/dL (ref 0.3–0.9)
Total Bilirubin: 0.5 mg/dL (ref 0.0–1.2)
Total Protein: 6.2 g/dL — ABNORMAL LOW (ref 6.5–8.1)

## 2024-06-16 LAB — MAGNESIUM: Magnesium: 2 mg/dL (ref 1.7–2.4)

## 2024-06-16 LAB — UREA NITROGEN, URINE: Urea Nitrogen, Ur: 490 mg/dL

## 2024-06-16 MED ORDER — SODIUM CHLORIDE 0.9 % IV SOLN: INTRAVENOUS | Status: DC

## 2024-06-16 MED ORDER — LACTATED RINGERS IV BOLUS
750.0000 mL | Freq: Once | INTRAVENOUS | Status: AC
  Administered 2024-06-16: 750 mL via INTRAVENOUS

## 2024-06-16 NOTE — Progress Notes (Signed)
°  Progress Note   Patient: Lauren Clark FMW:[id number] DOB: Jan 25, 1956 DOA: 06/15/2024     1 DOS: the patient was seen and examined on 06/16/2024   Brief hospital course: 68 y.o. female with medical history significant for hypertension, hyperlipidemia, hypothyroidism, CKD 3A, COPD, chronic hypoxic respiratory failure, CAD, atrial fibrillation on Eliquis , chronic HFpEF, and ascending aortic aneurysm followed by cardiothoracic surgery who presents with dysuria and left flank pain.   Patient reports that she first developed dysuria and discomfort in the left flank approximately 2 weeks ago.  Symptoms have persisted and she has also developed nausea and loss of appetite.  She has had chills intermittently with this.  She denies any recent chest pain, shortness of breath, or significant cough.   ED Course: Upon arrival to the ED, patient is found to be afebrile and saturating well on room air with normal RR, normal HR, and stable BP.  Labs are most notable for creatinine 2.56 and WBC 21,600.  UA notable for pyuria and bacteriuria with positive nitrites.  Renal ultrasound is notable for chronic partially atrophied right kidney but no acute findings.  Assessment and Plan: 1. AKI superimposed on CKD 3A  - SCr is 2.56 on admission, up from baseline closer to 1.2 in the setting of pyelonephritis and recent anorexia. Atrophic R kidney with concerns of L pyelo  -Cr up to 3.47 this AM - Cont to hold Lasix  and Jardiance -Given rising Cr in the setting of atrophic R kidney, have consulted Nephrology   2. Left pyelonephritis  - Not septic on admission  - Continue Rocephin , f/u on cultures   3. Atrial fibrillation  - Continue Eliquis  and diltiazem      4. Chronic HFpEF  - Appears compensated  - Hold Lasix  and Jardiance in light of AKI, monitor volume status     5. Hypothyroidism  - Continue Synthroid     6. Chronic hypoxic respiratory failure  - Currently stable on room air - Continue supplemental O2  as-needed    7. Mood disorder  - Continue Prozac     8. Fall - unwitnessed -Ordered and reviewed head CT and B hip xrays. Studies neg for fracture of ICH. -consulted PT/OT      Subjective: Still having L flank pains  Physical Exam: Vitals:   06/16/24 1113 06/16/24 1321 06/16/24 1325 06/16/24 1624  BP: (!) 113/53  123/63 (!) 97/54  Pulse: 67  79 73  Resp: 17 (!) 26 20 17   Temp: 98.5 F (36.9 C)   99 F (37.2 C)  TempSrc:      SpO2: 91% (!) 77% 94% 92%  Weight:      Height:       General exam: Awake, laying in bed, in nad Respiratory system: Normal respiratory effort, no wheezing Cardiovascular system: regular rate, s1, s2 Gastrointestinal system: Soft, nondistended, positive BS Central nervous system: CN2-12 grossly intact, strength intact Extremities: Perfused, no clubbing Skin: Normal skin turgor, no notable skin lesions seen Psychiatry: Mood normal // no visual hallucinations   Data Reviewed:  Labs reviewed: Na 132, K 3.5, Cr 3.47, WBC 20.5  Family Communication: Pt in room, family not at bedside  Disposition: Status is: Inpatient Remains inpatient appropriate because: severity of illness  Planned Discharge Destination: Pending PT/OT eval     Author: Garnette Pelt, MD 06/16/2024 5:25 PM  For on call review [email or web address].

## 2024-06-16 NOTE — Consult Note (Signed)
 Renal Service Consult Note [facility] [name], MD  Patient: Lauren Clark Date: 06/16/2024 Requesting Physician: Dr. Cindy  Reason for Consult: Renal failure HPI: The patient is a 68 y.o. year-old w/ PMH as below who presented to ED yesterday for complaints of urinary frequency and burning x 2 weeks.  Lauren Clark felt like the contrast MRI Lauren Clark had on 05/28/2024 gave Lauren Clark a urinary tract infection.  Lauren Clark has urinary frequency at baseline due to the use of a diuretic.  This has not changed.  Lauren Clark then experienced pain in Lauren Clark left side and back yesterday.  No fevers, no nausea, vomiting or bowel changes.  In ED blood pressure 120/57, HR 60-70, RR 18, temp 99.3, room air sats 97%.  Sodium 136, K+ 3.4, CO2 27, BUN 35, creatinine 2.56, calcium  8.7, albumin 2.5.  LFTs wnl, WBC 21K, Hgb 13.2.  Urinalysis showed cloudy, amber, ketones 5, moderate LE, nitrate positive, protein 100, bacteria many, epis 6-10, RBC 21-50, WBC greater than 50.  Urine sodium 36, urine creatinine 77.  Patient's baseline creatinine is 1.2. In ED Lauren Clark rec'd IV rocephin , and 500 cc LR bolus then NS 0.9% at 125 ml/hr.   We are asked to see for renal failure.    Pt seen in room. Lauren Clark doesn't have a kidney doctor, Lauren Clark PCP is Dr Loreli. Has not been told Lauren Clark has any kidney problems, other than UTI's and 1 kidney smaller than the other.  No sig nsaid use. Has not voided much since being admitted. No hx DM2, no hx HTN. BP's are usually low side of normal. Has O2 at home, uses it infrequently, 2-3 x per week. Uses it going out.    ROS - denies CP, no joint pain, no HA, no blurry vision, no rash, no diarrhea, no nausea/ vomiting   Past Medical History  Past Medical History:  Diagnosis Date   Anemia    s/p knee surgery   Anxiety    Aortic dissection (HCC)    Arthritis    COPD (chronic obstructive pulmonary disease) (HCC) 06/15/2024   Depression    GERD (gastroesophageal reflux disease)    Glaucoma    right eye uses drops    History of blood transfusion    [facility]   History of MRSA infection    Hyperlipidemia    Hypertension    Hypothyroidism    Impaired vision in both eyes    blind left eye, limited vision right eye   Morbid obesity (HCC)    s/p Gastric sleeve- 8'17(loss thus far approx. 100 lbs)   Peripheral vascular disease    dvt right leg   Pre-diabetes    prior to gastric sleeve   Past Surgical History  Past Surgical History:  Procedure Laterality Date   ABDOMINAL HYSTERECTOMY  1981   with bilateral BSO   BREAST BIOPSY Right 10/18/2023   MM RT BREAST BX W LOC DEV 1ST LESION IMAGE BX SPEC STEREO GUIDE 10/18/2023 [facility]   CATARACT EXTRACTION Right 2010   [facility]   COLONOSCOPY     DEBRIDEMENT AND CLOSURE WOUND Right 07/07/2014   Dr. Renato   EYE SURGERY     bil cataraction with IOL as a child, cataract surgery late 1990- right   JOINT REPLACEMENT Right 2016   knee   KNEE DEBRIDEMENT Right 06/22/2014   Dr. Reyes Boer with wound vac   KNEE SURGERY     i&d KNEE POST REPLACEMENT  LAPAROSCOPIC GASTRIC SLEEVE RESECTION N/A 02/29/2016   Procedure: LAPAROSCOPIC GASTRIC SLEEVE RESECTION, UPPER ENDO;  Surgeon: Camellia Blush, MD;  Location: [facility];  Service: General;  Laterality: N/A;   TONSILLECTOMY     TOTAL HIP ARTHROPLASTY Right 06/14/2016   Procedure: RIGHT TOTAL HIP ARTHROPLASTY ANTERIOR APPROACH;  Surgeon: Donnice Car, MD;  Location: [facility];  Service: Orthopedics;  Laterality: Right;   TOTAL KNEE ARTHROPLASTY Left 09/05/2017   Procedure: LEFT TOTAL KNEE ARTHROPLASTY;  Surgeon: Car Donnice, MD;  Location: [facility];  Service: Orthopedics;  Laterality: Left;  Adductor Block   UPPER GI ENDOSCOPY  01/25/2016   Family History  Family History  Problem Relation Age of Onset   Depression Mother    Hypertension Father    Breast cancer Neg Hx    Social History  reports that Lauren Clark has never smoked. Lauren Clark has never used smokeless tobacco. Lauren Clark  reports that Lauren Clark does not drink alcohol and does not use drugs. Allergies Allergies[1] Home medications Prior to Admission medications  Medication Sig Start Date End Date Taking? Authorizing Provider  BELSOMRA 10 MG TABS Take 1 tablet by mouth at bedtime. 11/03/23  Yes [provider]  brimonidine -timolol  (COMBIGAN ) 0.2-0.5 % ophthalmic solution Place 1 drop into the right eye 2 (two) times daily. 06/10/24  Yes [provider]  CALCIUM  CITRATE PO Take 600 mg by mouth daily.   Yes [provider]  Cyanocobalamin (VITAMIN B12 PO) Take 1 tablet by mouth in the morning.   Yes [provider]  ELIQUIS  5 MG TABS tablet TAKE 1 TABLET(5 MG) BY MOUTH TWICE DAILY 02/09/24  Yes Hilty, Vinie BROCKS, MD  empagliflozin (JARDIANCE) 10 MG TABS tablet Take 10 mg by mouth daily.   Yes [provider]  FLUoxetine  (PROZAC ) 40 MG capsule Take 40 mg by mouth daily. 08/29/22  Yes [provider]  furosemide  (LASIX ) 40 MG tablet Take 1 tablet (40 mg total) by mouth daily. 04/08/24  Yes Hilty, Vinie BROCKS, MD  latanoprost  (XALATAN ) 0.005 % ophthalmic solution Place 1 drop into the right eye at bedtime.    Yes [provider]  levothyroxine  (SYNTHROID ) 112 MCG tablet Take 112 mcg by mouth every morning. 09/08/23  Yes [provider]  magnesium  oxide (MAG-OX) 400 (240 Mg) MG tablet Take 400 mg by mouth daily.   Yes [provider]  NON FORMULARY Take 1 tablet by mouth 2 (two) times daily. Cranberry conascorib acid   Yes [provider]  Omega 3-6-9 Fatty Acids (TRIPLE OMEGA-3-6-9 PO) Take 1 capsule by mouth daily.   Yes [provider]  rosuvastatin  (CRESTOR ) 40 MG tablet Take 40 mg by mouth in the morning. 08/01/23  Yes [provider]  Skin Protectants, Misc. (MINERIN CREME EX) Apply 1 application  topically daily.   Yes [provider]     Vitals:   06/16/24 1113 06/16/24 1321 06/16/24 1325 06/16/24 1624  BP: (!)  113/53  123/63 (!) 97/54  Pulse: 67  79 73  Resp: 17 (!) 26 20 17   Temp: 98.5 F (36.9 C)   99 F (37.2 C)  TempSrc:      SpO2: 91% (!) 77% 94% 92%  Weight:      Height:       Exam Gen alert, no distress, morbidly obese, pleasant  Sclera anicteric, throat clear  No jvd or bruits, JVP about 8-10 Chest clear bilat to bases RRR no MRG Abd soft ntnd no mass or ascites +bs Ext no pitting  LE or UE edema, no other edema Neuro is alert, Ox 3 , nf   Home bp meds: Jardiance Lasix  40 every day Others: crestor , synthroid , prozac , eliquis , belsomra, eyedrops, supps    Date   Creat  eGFR (ml/min) 2017   0.94- 1.29 2019   0.99 May 2024  0.92- 1.11 January 2023  0.93- 1.34  Jun 21, 2023  1.50 01/01/24  1.33  43 ml/min, 3b 04/15/24  1.20  49 ml/min, 3a 06/15/24  2.56 06/16/24  3.47    BP: mostly 110s-120s systolic, rare 100s I/O: not recording  Wts: 93.9kg on 12/13, 98.9kg on 12/14  UA cloudy, amber, glu > 500, small Hb, ket 5, mod LE, +nitrite, prot 100, many bact, 21-50 rbc, 6-10 epis, > 50 wbcs UNa 36, UCr 77 Renal US  12/13 --> R kidney 7.4 cm, chronically partially atrophied, +asymmetric decrease in corticomedullary differentiation, no hydro     ---> L kidney 12.1 cm, normal echotexture, no hydro  CXR: not done Labs today --> Na 132, K+ 3.5 CO2 19 BUN 40, creat 3.47     Assessment/ Plan: AKI on CKD 3a: baseline creatinine is 1.2- 1.3, eGFR 43- 49 ml/min, from June - Oct 2025. Has atrophic R kidney at 7.4cm, normal appearing L kidney. Creat here was 2.5 on admission and is up to 3.4 this am. Has not voided much since admission early on 12/13. BP's low normal in 100- 120 range SBP. UA looks infected. Was taking lasix  and jardiance at home - along with suspected infection and loss of appetite, these could lead to volume depletion. No hx of contrast, nsaids, and no acei/ ARBs.  Lauren Clark is getting LR at 125 cc/hr. Will re-order current IVFs and bolus 750 cc NS x 1, place foley for UOP  recording. No contrast, nsaids. Will follow.  ^WBC: at 21K on 12/13 and 20k today. Getting IV abx w/ rocephin .  Volume: looks euvolemic to vol depleted. Difficult exam w/ obesity. Cont IVFs as above.  COPD: on home O2 off and on Morbid obesity    Myer Fret  MD [facility] 06/16/2024, 7:50 PM  Recent Labs  Lab 06/15/24 0207 06/16/24 0523  CREATININE 2.56* 3.47*  K 3.4* 3.5   Inpatient medications:  apixaban   5 mg Oral BID   brimonidine   1 drop Right Eye BID   And   timolol   1 drop Right Eye BID   FLUoxetine   40 mg Oral Daily   Gerhardt's butt cream   Topical TID   levothyroxine   112 mcg Oral Q0600   sodium chloride  flush  3 mL Intravenous Q12H    sodium chloride  125 mL/hr at 06/15/24 1921   cefTRIAXone  (ROCEPHIN )  IV 1 g (06/15/24 2042)   acetaminophen  **OR** acetaminophen , oxyCODONE , prochlorperazine , senna       [1]  Allergies Allergen Reactions   Crestor  [Rosuvastatin  Calcium ] Other (See Comments)    Muscle aches     Lipitor [Atorvastatin] Other (See Comments)    Muscle aches    Hctz [Hydrochlorothiazide] Other (See Comments)    Pt doesn't remember

## 2024-06-16 NOTE — Hospital Course (Signed)
 68 y.o. female with medical history significant for hypertension, hyperlipidemia, hypothyroidism, CKD 3A, COPD, chronic hypoxic respiratory failure, CAD, atrial fibrillation on Eliquis , chronic HFpEF, and ascending aortic aneurysm followed by cardiothoracic surgery who presents with dysuria and left flank pain.   Patient reports that she first developed dysuria and discomfort in the left flank approximately 2 weeks ago.  Symptoms have persisted and she has also developed nausea and loss of appetite.  She has had chills intermittently with this.  She denies any recent chest pain, shortness of breath, or significant cough.   ED Course: Upon arrival to the ED, patient is found to be afebrile and saturating well on room air with normal RR, normal HR, and stable BP.  Labs are most notable for creatinine 2.56 and WBC 21,600.  UA notable for pyuria and bacteriuria with positive nitrites.  Renal ultrasound is notable for chronic partially atrophied right kidney but no acute findings.

## 2024-06-16 NOTE — Significant Event (Addendum)
 At approximately 1320, overheard at desk Room 39 fell from unit secretary while this RN was sitting in hall. Immediately moved to assist and assess patient. Patient was found sitting on floor without using rolling walker. Bed alarm not active, patient did not call for assistance for toileting. Yellow fall socks in use. Patient was alert and oriented and had pulled emergency cord in bathroom to notify staff of event. Per patient, she had just finished in the bathroom and was walking out to return to bed when her knees gave out and she fell flat on (my) bottom. Patient unsure if she hit her head during fall. Concerningly, patient had removed her 2L nasal cannula to ambulate and was found at 77% Room Air in bathroom, very tachypneic. Staff assistance received from this RN, Alfonso, tech, and [employer], [employer]. Oxygen cylinder immediately obtained and patient placed on 4L nasal cannula to regain 97% oxygenation. Vitals assessed, as documented. A rolling walker was placed in front of patient. Patient was able to scoot forward to allow staff behind her to assist with standing. Patient was able to move her feet under her and stood with assistance from this RN. Posterior head on palpation showed no swelling and no obvious fracture palpated. Patient reports 6/10 pain to head, described as a headache that comes and goes, but did not increase in pain on palpation. Patient demonstrated appropriate ROM with BLLE without any pain and was able to walk as standby assist with rolling walker back to bed. On return to bed, a bedside commode was provided for future toileting, fall mats placed, 3 side rails in place, and bed alarm set to level 2 of 3. An ice pack was placed at this time to head for headache. Patient educated on requiring staff assistance for ambulation moving forward and to use call bell, verbalized understanding.   Dr Garnette Pelt on unit performing rounds and promptly notified by this RN face-to-face. As patient is  on apixaban , concern that patient may have hit head in fall. STAT imaging of head and bilateral hips ordered, and PT/OT consulted. Patient notified of need for imaging and agreeable. Informed patient per policy, family should be notified, Particia, sister-in-law listed as primary contact, however patient reports she no longer talks to [place] as she divorced from her family. Patient instead requests Lisa Fullington, close friend and second contact, be notified instead. This RN called Olam at documented number at 1330 and informed of fall as well as post fall interventions. Olam appreciative of RN contact and states she is not surprised as she (patient) has fallen 5 or 6 times since August.

## 2024-06-17 ENCOUNTER — Inpatient Hospital Stay (HOSPITAL_COMMUNITY)

## 2024-06-17 LAB — CBC
HCT: 37.6 % (ref 36.0–46.0)
Hemoglobin: 11.9 g/dL — ABNORMAL LOW (ref 12.0–15.0)
MCH: 30.1 pg (ref 26.0–34.0)
MCHC: 31.6 g/dL (ref 30.0–36.0)
MCV: 95.2 fL (ref 80.0–100.0)
Platelets: 222 K/uL (ref 150–400)
RBC: 3.95 MIL/uL (ref 3.87–5.11)
RDW: 14.9 % (ref 11.5–15.5)
WBC: 18.5 K/uL — ABNORMAL HIGH (ref 4.0–10.5)
nRBC: 0 % (ref 0.0–0.2)

## 2024-06-17 LAB — HEPATITIS B SURFACE ANTIGEN: Hepatitis B Surface Ag: NONREACTIVE

## 2024-06-17 LAB — PROTEIN / CREATININE RATIO, URINE
Creatinine, Urine: 25 mg/dL
Protein Creatinine Ratio: 1.72 mg/mg{creat} — ABNORMAL HIGH (ref 0.00–0.15)
Total Protein, Urine: 43 mg/dL

## 2024-06-17 LAB — PROCALCITONIN: Procalcitonin: 2.53 ng/mL

## 2024-06-17 LAB — BRAIN NATRIURETIC PEPTIDE: B Natriuretic Peptide: 567.8 pg/mL — ABNORMAL HIGH (ref 0.0–100.0)

## 2024-06-17 LAB — HEPATITIS B CORE ANTIBODY, IGM: Hep B C IgM: NONREACTIVE

## 2024-06-17 LAB — COMPREHENSIVE METABOLIC PANEL WITH GFR
ALT: 45 U/L — ABNORMAL HIGH (ref 0–44)
AST: 45 U/L — ABNORMAL HIGH (ref 15–41)
Albumin: 2 g/dL — ABNORMAL LOW (ref 3.5–5.0)
Alkaline Phosphatase: 100 U/L (ref 38–126)
Anion gap: 11 (ref 5–15)
BUN: 42 mg/dL — ABNORMAL HIGH (ref 8–23)
CO2: 20 mmol/L — ABNORMAL LOW (ref 22–32)
Calcium: 8.5 mg/dL — ABNORMAL LOW (ref 8.9–10.3)
Chloride: 103 mmol/L (ref 98–111)
Creatinine, Ser: 3.39 mg/dL — ABNORMAL HIGH (ref 0.44–1.00)
GFR, Estimated: 14 mL/min — ABNORMAL LOW (ref >60)
Glucose, Bld: 112 mg/dL — ABNORMAL HIGH (ref 70–99)
Potassium: 3.7 mmol/L (ref 3.5–5.1)
Sodium: 134 mmol/L — ABNORMAL LOW (ref 135–145)
Total Bilirubin: 0.6 mg/dL (ref 0.0–1.2)
Total Protein: 6.2 g/dL — ABNORMAL LOW (ref 6.5–8.1)

## 2024-06-17 MED ORDER — IPRATROPIUM-ALBUTEROL 0.5-2.5 (3) MG/3ML IN SOLN
3.0000 mL | RESPIRATORY_TRACT | Status: DC | PRN
  Administered 2024-06-17 – 2024-06-18 (×5): 3 mL via RESPIRATORY_TRACT
  Filled 2024-06-17 (×5): qty 3

## 2024-06-17 MED ORDER — IPRATROPIUM-ALBUTEROL 0.5-2.5 (3) MG/3ML IN SOLN
3.0000 mL | Freq: Four times a day (QID) | RESPIRATORY_TRACT | Status: AC | PRN
  Administered 2024-06-17: 03:00:00 3 mL via RESPIRATORY_TRACT

## 2024-06-17 MED ORDER — FUROSEMIDE 10 MG/ML IJ SOLN
60.0000 mg | Freq: Once | INTRAMUSCULAR | Status: AC
  Administered 2024-06-17: 11:00:00 60 mg via INTRAVENOUS
  Filled 2024-06-17: qty 6

## 2024-06-17 NOTE — Evaluation (Signed)
 Occupational Therapy Evaluation Patient Details Name: Lauren Clark MRN: [id number] DOB: 04-30-1956 Today's Date: 06/17/2024   History of Present Illness   68 yo F adm 12/13 UTI with burning. Pt with AKI, L pyelonephrtis. PMH anemia, anxiety, aortic dissection COPD, arthritis, depression, glaucoma, blind L eye, limited vision r eye, obese, PVD, gastric sleeve, R THA, HTN, HLD, CKD III,CAD, afib on eliquis , HFpEF, aortic aneurysm.     Clinical Impressions Pt typically lives alone and is independent in ADL and mobility. She uses a RW outside of her apt, and until recently was getting her own groceries. She drives despite her visual limitations. Pt today is overall min A for transfers, gets SOB (maintains >90% on 5L) with sit<>stand activities. Please see  OT problem list below. Pt will benefit from skilled OT in the acute setting as well as afterward at the [facility] level to maximize safety and independence in ADL and functional transfers. Next session focus on energy conservation (this seems to be a big problem at home) as well as increasing standing activity and further balance assessment (does have hx of falling).      If plan is discharge home, recommend the following:   A little help with walking and/or transfers;A little help with bathing/dressing/bathroom;Assistance with cooking/housework     Functional Status Assessment   Patient has had a recent decline in their functional status and demonstrates the ability to make significant improvements in function in a reasonable and predictable amount of time.     Equipment Recommendations   None recommended by OT     Recommendations for Other Services   PT consult     Precautions/Restrictions   Precautions Precautions: Fall;Other (comment) Recall of Precautions/Restrictions: Intact Precaution/Restrictions Comments: watch O2 sats Restrictions Weight Bearing Restrictions Per Provider Order: No     Mobility Bed Mobility                General bed mobility comments: Pt in recliner at beginning and end of session    Transfers Overall transfer level: Needs assistance Equipment used: 1 person hand held assist Transfers: Sit to/from Stand, Bed to chair/wheelchair/BSC Sit to Stand: Min assist     Step pivot transfers: Min assist     General transfer comment: Min A for initial momentum to clear hips and get to standing. Min A for stability with cues for sequencing with step pivot transfer.      Balance Overall balance assessment: Needs assistance Sitting-balance support: Bilateral upper extremity supported, Feet supported Sitting balance-Leahy Scale: Fair     Standing balance support: Bilateral upper extremity supported, During functional activity Standing balance-Leahy Scale: Poor Standing balance comment: Min A for stability                           ADL either performed or assessed with clinical judgement   ADL Overall ADL's : Needs assistance/impaired Eating/Feeding: Set up   Grooming: Set up;Sitting Grooming Details (indicate cue type and reason): typically does this in standing Upper Body Bathing: Minimal assistance   Lower Body Bathing: Moderate assistance Lower Body Bathing Details (indicate cue type and reason): prefers a wash cloth, reviewed energy saving benefits of long handle sponge Upper Body Dressing : Minimal assistance   Lower Body Dressing: Maximal assistance;Sitting/lateral leans Lower Body Dressing Details (indicate cue type and reason): pt does not wear socks, and uses step in shoes at baseline Toilet Transfer: Contact guard assist;Stand-pivot Toilet Transfer Details (indicate cue type and  reason): simulated through recliner transfer Toileting- Clothing Manipulation and Hygiene: Maximal assistance       Functional mobility during ADLs: Minimal assistance (1 person HHA from recliner, pivot only) General ADL Comments: decreased activity tolerance, balance,  needs energy conservation education and AE practice     Vision Baseline Vision/History: 2 Legally blind Ability to See in Adequate Light: 1 Impaired Patient Visual Report: No change from baseline Vision Assessment?: Wears glasses for reading;Wears glasses for driving Additional Comments: limited vision out of R eye only     Perception         Praxis         Pertinent Vitals/Pain Pain Assessment Pain Assessment: No/denies pain     Extremity/Trunk Assessment Upper Extremity Assessment Upper Extremity Assessment: Generalized weakness   Lower Extremity Assessment Lower Extremity Assessment: Defer to PT evaluation   Cervical / Trunk Assessment Cervical / Trunk Assessment: Kyphotic   Communication Communication Communication: No apparent difficulties   Cognition Arousal: Alert Behavior During Therapy: WFL for tasks assessed/performed               OT - Cognition Comments: decreased safety awareness                 Following commands: Intact       Cueing  General Comments   Cueing Techniques: Verbal cues  VSS on 5L   Exercises Exercises: Other exercises Other Exercises Other Exercises: sit<>stand fast repeat X5   Shoulder Instructions      Home Living Family/patient expects to be discharged to:: Private residence Living Arrangements: Alone Available Help at Discharge: Other (Comment) (no available assistance) Type of Home: Apartment Home Access: Level entry;Elevator     Home Layout: One level     Bathroom Shower/Tub: Chief Strategy Officer: Handicapped height Bathroom Accessibility: Yes How Accessible: Accessible via walker Home Equipment: Rollator (4 wheels);Grab bars - tub/shower;Grab bars - toilet;Shower seat;Hand held shower head;Adaptive equipment Adaptive Equipment: Reacher;Long-handled sponge Additional Comments: has a friend whos spouse is sick and is unable to help      Prior Functioning/Environment Prior Level  of Function : Driving;Independent/Modified Independent             Mobility Comments: uses Rollator in the community and uses walls in home. ADLs Comments: Ind with ADLs and IADLs, can get groceries ordered    OT Problem List: Decreased strength;Decreased activity tolerance;Impaired balance (sitting and/or standing);Decreased safety awareness;Decreased knowledge of use of DME or AE;Cardiopulmonary status limiting activity;Obesity   OT Treatment/Interventions: Self-care/ADL training;Energy conservation;DME and/or AE instruction;Therapeutic activities;Patient/family education;Balance training      OT Goals(Current goals can be found in the care plan section)   Acute Rehab OT Goals Patient Stated Goal: be able to unload groceries without getting tired OT Goal Formulation: With patient Time For Goal Achievement: 07/01/24 Potential to Achieve Goals: Good ADL Goals Pt Will Perform Grooming: standing;with supervision Pt Will Perform Upper Body Dressing: sitting;with supervision Pt Will Perform Lower Body Dressing: with supervision;sit to/from stand Pt Will Transfer to Toilet: with supervision;ambulating Pt Will Perform Toileting - Clothing Manipulation and hygiene: with supervision;sit to/from stand Additional ADL Goal #1: Pt will verbalize 3 strategies for energy conservation during ADL with no cues   OT Frequency:  Min 2X/week    Co-evaluation              AM-PAC OT 6 Clicks Daily Activity     Outcome Measure Help from another person eating meals?: None Help from another person  taking care of personal grooming?: A Little Help from another person toileting, which includes using toliet, bedpan, or urinal?: A Little Help from another person bathing (including washing, rinsing, drying)?: A Lot Help from another person to put on and taking off regular upper body clothing?: A Little Help from another person to put on and taking off regular lower body clothing?: A Lot 6  Click Score: 17   End of Session Equipment Utilized During Treatment: Oxygen (5L) Nurse Communication: Mobility status;Precautions  Activity Tolerance: Patient tolerated treatment well Patient left: in chair;with call bell/phone within reach;with chair alarm set  OT Visit Diagnosis: Unsteadiness on feet (R26.81);Other abnormalities of gait and mobility (R26.89);Muscle weakness (generalized) (M62.81);History of falling (Z91.81);Low vision, both eyes (H54.2)                Time: 1140-1200 OT Time Calculation (min): 20 min Charges:  OT General Charges $OT Visit: 1 Visit OT Evaluation $OT Eval Moderate Complexity: 1 Mod  Leita DEL OTR/L Acute Rehabilitation Services Office: [phone]  Leita JINNY Odea 06/17/2024, 2:23 PM

## 2024-06-17 NOTE — Progress Notes (Signed)
 [facility] Progress Note    Assessment/ Plan:   AKI on CKD3a -baseline Cr ~1.2-1.3. Has atrophic right kidney/solitary functioning left kidney. Cr down to 3.39 today -AKI likely secondary to left pyelonephritis with concomitant lasix +jardiance. No obstruction on ultrasound -fluids stopped in the context of SOB/wheezing. Watch off fluids, encouraged PO hydration in the interim -will check serologies out of precaution given proteinuria (100 on UA) and hematuria (likely related to infection) -continue with foley for now -Avoid nephrotoxic medications including NSAIDs and iodinated intravenous contrast exposure unless the latter is absolutely indicated.  Preferred narcotic agents for pain control are hydromorphone , fentanyl , and methadone. Morphine  should not be used. Avoid Baclofen and avoid oral sodium phosphate  and magnesium  citrate based laxatives / bowel preps. Continue strict Input and Output monitoring. Will monitor the patient closely with you and intervene or adjust therapy as indicated by changes in clinical status/labs   Left pyelonephritis -on rocephin  -per primary service  SOB Chronic respiratory failure -possibly related to COPD? CXR reviewed: no acute findings but some increased interstitial markings--possible vascular congestion. Fluids on hold, recommend low threshold for lasix  -COPD eval per primary  Chronic HFpEF -hold fluids for now  Afib -on eliquis  and diltiazem   Ephriam Stank, MD [facility]  Subjective:   Patient seen and examined. She reports that she had audible wheezing last night. Primary service had stopped fluids in the interim. CXR without acute findings (per report). Patient reports decreased appetite and nausea which has been ongoing. Denies any diarrhea.   Objective:   BP 131/83 (BP Location: Left Arm)   Pulse 71   Temp 98.2 F (36.8 C) (Oral)   Resp (!) 22   Ht 4' 8 (1.422 m)   Wt 98.9 kg   SpO2 93%   BMI 48.88  kg/m   Intake/Output Summary (Last 24 hours) at 06/17/2024 0940 Last data filed at 06/17/2024 0300 Gross per 24 hour  Intake 1184 ml  Output 350 ml  Net 834 ml   Weight change:   Physical Exam: Gen:NAD CVS: irreg irreg Resp: CTA BL, no w/r/r/c appreciated Abd: obese, soft Ext: nonpitting edema b/l Les Neuro: awake, alert, conversant, no myoclonic jerks observed  Imaging: DG Chest Port 1 View Result Date: 06/17/2024 EXAM: 1 VIEW(S) XRAY OF THE CHEST 06/17/2024 01:18:00 AM COMPARISON: CT chest dated 04/22/2024. CLINICAL HISTORY: Wheezing. FINDINGS: LUNGS AND PLEURA: Mild plate-like scarring in the right mid lung. Increased interstitial markings without frank interstitial edema. No pleural effusion. No pneumothorax. HEART AND MEDIASTINUM: Cardiomegaly. BONES AND SOFT TISSUES: No acute osseous abnormality. IMPRESSION: 1. No acute findings. 2. Cardiomegaly. Electronically signed by: Pinkie Pebbles MD 06/17/2024 01:32 AM EST RP Workstation: [id number]   DG HIP UNILAT WITH PELVIS 1V RIGHT Result Date: 06/16/2024 CLINICAL DATA:  Fall. EXAM: DG HIP (WITH OR WITHOUT PELVIS) 1V RIGHT; DG HIP (WITH OR WITHOUT PELVIS) 1V*L* COMPARISON:  None Available. FINDINGS: There is no evidence of acute hip fracture or dislocation. Mild degenerative changes are present at the left hip. IMPRESSION: Mild degenerative changes at the left hip with no evidence of acute fracture or dislocation. Electronically Signed   By: Leita Birmingham M.D.   On: 06/16/2024 15:20   DG HIP UNILAT WITH PELVIS 1V LEFT Result Date: 06/16/2024 CLINICAL DATA:  Fall. EXAM: DG HIP (WITH OR WITHOUT PELVIS) 1V RIGHT; DG HIP (WITH OR WITHOUT PELVIS) 1V*L* COMPARISON:  None Available. FINDINGS: There is no evidence of acute hip fracture or dislocation. Mild degenerative changes are present at the  left hip. IMPRESSION: Mild degenerative changes at the left hip with no evidence of acute fracture or dislocation. Electronically Signed   By: Leita Birmingham M.D.   On: 06/16/2024 15:20   CT HEAD WO CONTRAST ( ) Result Date: 06/16/2024 EXAM: CT HEAD WITHOUT 06/16/2024 02:50:24 PM TECHNIQUE: CT of the head was performed without the administration of intravenous contrast. Automated exposure control, iterative reconstruction, and/or weight based adjustment of the mA/kV was utilized to reduce the radiation dose to as low as reasonably achievable. COMPARISON: Head MRI 02/04/2022. CLINICAL HISTORY: Head trauma, minor (Age >= 65y); fall, on eliquis . FINDINGS: BRAIN AND VENTRICLES: There is no evidence of an acute infarct, intracranial hemorrhage, midline shift, hydrocephalus, or extra-axial fluid collection. There is mild cerebral atrophy. Cerebral white matter hypodensities are nonspecific but compatible with mild chronic small vessel ischemic disease. A 1.1 cm calcified extra-axial mass along the anterior falx is unchanged. Calcified atherosclerosis at the skull base. ORBITS: Right cataract extraction. Left phthisis bulbi. SINUSES AND MASTOIDS: No acute abnormality. SOFT TISSUES AND SKULL: No acute skull fracture. No acute soft tissue abnormality. IMPRESSION: 1. No acute intracranial abnormality. 2. Mild chronic small vessel ischemic disease. 3. Unchanged small anterior falcine meningioma. Electronically signed by: Dasie Hamburg MD 06/16/2024 03:10 PM EST RP Workstation: [id number]    Labs: BMET Recent Labs  Lab 06/15/24 0207 06/16/24 0523 06/17/24 0135  NA 136 132* 134*  K 3.4* 3.5 3.7  CL 98 99 103  CO2 27 19* 20*  GLUCOSE 152* 123* 112*  BUN 35* 40* 42*  CREATININE 2.56* 3.47* 3.39*  CALCIUM  8.7* 8.4* 8.5*   CBC Recent Labs  Lab 06/15/24 0207 06/16/24 0523 06/17/24 0135  WBC 21.6* 20.5* 18.5*  NEUTROABS 18.6*  --   --   HGB 13.2 12.1 11.9*  HCT 41.5 38.7 37.6  MCV 93.9 96.0 95.2  PLT 251 199 222    Medications:     apixaban   5 mg Oral BID   brimonidine   1 drop Right Eye BID   And   timolol   1 drop Right Eye BID    FLUoxetine   40 mg Oral Daily   Gerhardt's butt cream   Topical TID   levothyroxine   112 mcg Oral Q0600   sodium chloride  flush  3 mL Intravenous Q12H      Ephriam Stank, MD [facility] 06/17/2024, 9:40 AM

## 2024-06-17 NOTE — Plan of Care (Signed)

## 2024-06-17 NOTE — TOC Initial Note (Signed)
 Transition of Care [facility]) - Initial/Assessment Note    Patient Details  Name: Lauren Clark MRN: [id number] Date of Birth: Mar 30, 1956  [facility]) CM/SW Contact:    Nola Devere Hands, RN Phone Number: 06/17/2024, 2:43 PM  Clinical Narrative:                 Patient is a 68 yr old female admitted with UTI, AKI, pylonephritis. Patient is from home alone, states she lives with Ellouise Guys cat! Has a friend that will assist as needed. Discussed recommendation for Home Health physical therapy. Patient has no preference except that it not be same agency she used in the past, [facility]. Referral was sent to [facility], Liaison with [facility]. Patient states her walker is very old and requests a new rollator. CM has entered order. TOC Team will continue to follow.  Barriers to Discharge: Continued Medical Work up   Patient Goals and CMS Choice            Expected Discharge Plan and Services   Discharge Planning Services: CM Consult Post Acute Care Choice: Home Health, Durable Medical Equipment Living arrangements for the past 2 months: Apartment                 DME Arranged: Walker rolling with seat DME Agency: [employer] Date DME Agency Contacted: 06/17/24 Time DME Agency Contacted: 1441 Representative spoke with at DME Agency: [place] HH Arranged: PT, OT HH Agency: [facility] Date [facility] Agency Contacted: 06/17/24 Time HH Agency Contacted: 1400 Representative spoke with at [facility] Agency: [place]  Prior Living Arrangements/Services Living arrangements for the past 2 months: Apartment Lives with:: Self Patient language and need for interpreter reviewed:: Yes Do you feel safe going back to the place where you live?: Yes        Care giver support system in place?: Yes (comment)   Criminal Activity/Legal Involvement Pertinent to Current Situation/Hospitalization: No - Comment as needed  Activities of Daily Living   ADL Screening (condition  at time of admission) Independently performs ADLs?: Yes (appropriate for developmental age) Is the patient deaf or have difficulty hearing?: No Does the patient have difficulty seeing, even when wearing glasses/contacts?: No Does the patient have difficulty concentrating, remembering, or making decisions?: No  Permission Sought/Granted         Permission granted to share info w AGENCY: [facility]        Emotional Assessment   Attitude/Demeanor/Rapport: Gracious   Orientation: : Oriented to Self, Oriented to Place, Oriented to  Time, Oriented to Situation Alcohol / Substance Use: Not Applicable Psych Involvement: No (comment)  Admission diagnosis:  Pyelonephritis [N12] AKI (acute kidney injury) [N17.9] Acute renal failure superimposed on stage 3 chronic kidney disease, unspecified acute renal failure type, unspecified whether stage 3a or 3b CKD (HCC) [N17.9, N18.30] Patient Active Problem List   Diagnosis Date Noted   Acute renal failure superimposed on stage 3a chronic kidney disease (HCC) 06/15/2024   Pyelonephritis of left kidney 06/15/2024   COPD (chronic obstructive pulmonary disease) (HCC) 06/15/2024   AKI (acute kidney injury) 01/13/2023   Hypothyroidism 01/12/2023   Acute on chronic diastolic CHF (congestive heart failure) (HCC) 01/11/2023   Chronic heart failure with preserved ejection fraction (HFpEF) (HCC) 01/11/2023   Atrial fibrillation, chronic (HCC) 11/18/2022   Intramural aortic hematoma (HCC) 11/18/2022   Aortic dissection (HCC) 11/12/2022   Positive hepatitis C antibody test 09/02/2019   S/P left TKA 09/05/2017   S/P total  knee replacement 09/05/2017   S/P right THA, AA 06/14/2016   Prediabetes 02/29/2016   Dyslipidemia 02/29/2016   Degenerative joint disease of right hip 02/29/2016   S/P laparoscopic sleeve gastrectomy 02/29/2016   Preoperative cardiovascular examination 02/19/2016   Hyperlipidemia 02/19/2016   Class 3 obesity (HCC) 02/19/2016    Essential hypertension 02/19/2016   PCP:  [name], MD Pharmacy:   [facility] [phone] [place] [place], [place] - [address] AT [facility] OF [facility] & [address] [place] [address] Phone: [phone] Fax: [phone]  [facility] #[id number] [place], [place] - [address] AT [facility] OF [address] & [address] [place] [address] Phone: [phone] Fax: [phone]     Social Drivers of Health (SDOH) Social History: SDOH Screenings   Food Insecurity: No Food Insecurity (06/15/2024)  Housing: Low Risk (06/15/2024)  Transportation Needs: No Transportation Needs (06/15/2024)  Utilities: Not At Risk (06/15/2024)  Social Connections: Moderately Integrated (06/15/2024)  Tobacco Use: Low Risk (06/15/2024)   SDOH Interventions:     Readmission Risk Interventions     No data to display

## 2024-06-17 NOTE — Evaluation (Signed)
 Physical Therapy Evaluation Patient Details Name: Lauren Clark MRN: [id number] DOB: [birth year] Today's Date: 06/17/2024  History of Present Illness  68 yo F adm 12/13 UTI with burning. Pt with AKI, L pyelonephrtis. PMH anemia, anxiety, aortic dissection COPD, arthritis, depression, glaucoma, blind L eye, limited vision r eye, obese, PVD, gastric sleeve, R THA, HTN, HLD, CKD III,CAD, afib on eliquis , HFpEF, aortic aneurysm.  Clinical Impression  Pt is currently presenting at [facility] for bed mobility, sit to stand and transfers at [facility]. Pt reports that she was using the walls in her home and rollator in community prior to hospitalization. Pt is currently limited due to respiratory status and expected to progress well. Due to pt current functional status, home set up and available assistance at home recommending skilled physical therapy services 3x/week in order to address strength, balance and functional mobility to decrease risk for falls, injury and re-hospitalization.           If plan is discharge home, recommend the following: Assist for transportation;Assistance with cooking/housework     Equipment Recommendations None recommended by PT     Functional Status Assessment Patient has had a recent decline in their functional status and demonstrates the ability to make significant improvements in function in a reasonable and predictable amount of time.     Precautions / Restrictions Precautions Precautions: Fall;Other (comment) Recall of Precautions/Restrictions: Intact Precaution/Restrictions Comments: watch O2 sats Restrictions Weight Bearing Restrictions Per Provider Order: No      Mobility  Bed Mobility Overal bed mobility: Needs Assistance Bed Mobility: Supine to Sit     Supine to sit: Min assist, HOB elevated     General bed mobility comments: Min A for trunk to mid line    Transfers Overall transfer level: Needs assistance Equipment used: 1 person hand held  assist Transfers: Sit to/from Stand, Bed to chair/wheelchair/BSC Sit to Stand: Min assist   Step pivot transfers: Min assist       General transfer comment: Min A for initial momentum to clear hips and get to standing. Min A for stability with cues for sequencing with step pivot transfer.    Ambulation/Gait             Pre-gait activities: Pt took steps from EOB to recliner at [facility] A for stability with light UE support on therapist and chair. Pt was limited due to current respiratory status.      Balance Overall balance assessment: Needs assistance Sitting-balance support: Bilateral upper extremity supported, Feet supported Sitting balance-Leahy Scale: Fair     Standing balance support: Bilateral upper extremity supported, During functional activity Standing balance-Leahy Scale: Poor Standing balance comment: Min A for stability         Pertinent Vitals/Pain Pain Assessment Pain Assessment: No/denies pain    Home Living Family/patient expects to be discharged to:: Private residence Living Arrangements: Alone Available Help at Discharge: Other (Comment) (no available assistance) Type of Home: Apartment Home Access: Level entry;Elevator       Home Layout: One level Home Equipment: Rollator (4 wheels);Grab bars - tub/shower;Grab bars - toilet;Shower seat;Hand held shower head Additional Comments: has a friend whos spouse is sick and is unable to help    Prior Function Prior Level of Function : Driving;Independent/Modified Independent             Mobility Comments: uses Rollator in the community and uses walls in home. ADLs Comments: Ind with ADLs and IADLs, can get groceries ordered     Extremity/Trunk  Assessment   Upper Extremity Assessment Upper Extremity Assessment: Defer to OT evaluation    Lower Extremity Assessment Lower Extremity Assessment: Generalized weakness;Overall [facility] for tasks assessed    Cervical / Trunk Assessment Cervical / Trunk  Assessment: Kyphotic  Communication   Communication Communication: No apparent difficulties    Cognition Arousal: Alert Behavior During Therapy: WFL for tasks assessed/performed   PT - Cognitive impairments: No apparent impairments     Following commands: Intact       Cueing Cueing Techniques: Verbal cues     General Comments General comments (skin integrity, edema, etc.): O2 sats 78% on 4L O2 via [place] initially after 2-3 min of purse lipped breathing and no change; bumped up to 5L and O2 sats slowly up to 90%, HR 95 bpm, after breathing tx RN gave during session O2 sats 93% and HR 83 bpm        Assessment/Plan    PT Assessment Patient needs continued PT services  PT Problem List Decreased strength;Decreased activity tolerance;Decreased balance;Decreased mobility       PT Treatment Interventions DME instruction;Balance training;Gait training;Functional mobility training;Patient/family education;Therapeutic activities;Therapeutic exercise    PT Goals (Current goals can be found in the Care Plan section)  Acute Rehab PT Goals Patient Stated Goal: to return home. PT Goal Formulation: With patient Time For Goal Achievement: 07/01/24 Potential to Achieve Goals: Good    Frequency Min 2X/week        AM-PAC PT 6 Clicks Mobility  Outcome Measure Help needed turning from your back to your side while in a flat bed without using bedrails?: A Little Help needed moving from lying on your back to sitting on the side of a flat bed without using bedrails?: A Little Help needed moving to and from a bed to a chair (including a wheelchair)?: A Little Help needed standing up from a chair using your arms (e.g., wheelchair or bedside chair)?: A Little Help needed to walk in hospital room?: A Lot Help needed climbing 3-5 steps with a railing? : A Lot 6 Click Score: 16    End of Session Equipment Utilized During Treatment: Gait belt;Oxygen Activity Tolerance: Patient tolerated  treatment well Patient left: in chair;with call bell/phone within reach;with chair alarm set;with nursing/sitter in room Nurse Communication: Mobility status PT Visit Diagnosis: Unsteadiness on feet (R26.81);Other abnormalities of gait and mobility (R26.89);Muscle weakness (generalized) (M62.81)    Time: 1010-1033 PT Time Calculation (min) (ACUTE ONLY): 23 min   Charges:   PT Evaluation $PT Eval Low Complexity: 1 Low PT Treatments $Therapeutic Activity: 8-22 mins PT General Charges $$ ACUTE PT VISIT: 1 Visit         Dorothyann Maier, DPT, CLT  Acute Rehabilitation Services Office: [phone] (Secure chat preferred)   Dorothyann VEAR Maier 06/17/2024, 11:48 AM

## 2024-06-17 NOTE — Progress Notes (Signed)
 [facility] night cross cover note:   I was notified by the patient's RN that the patient is exhibiting evidence of new wheezing on exam, associated with interval increase in supplemental oxygen requirements from 2 to 3 L nasal cannula.  The patient denies any associated new symptoms, including no associated shortness of breath or chest pain.   Other vital signs appear stable at this time, including afebrile; heart rates in the 70s; most recent systolic blood pressures in the 120s.  Most recent oxygen saturation 92% on 3 L nasal cannula.  The patient is hospitalized with AKI on CKD3a.  She received a 750 cc IV fluid bolus earlier this evening, followed by continuous NS running at 125 cc/h.  In light of the above changes on exam as well as increase in supplemental oxygen requirements, will hold continuous IV fluids for now, and pursue updated chest x-ray. Also placed order for prn to nebulizers and have ordered BNP as well as procalcitonin levels.      Eva Pore, DO Hospitalist

## 2024-06-17 NOTE — Progress Notes (Signed)
 Progress Note   Patient: Lauren Clark FMW:[id number] DOB: 08-23-1955 DOA: 06/15/2024     2 DOS: the patient was seen and examined on 06/17/2024   Brief hospital course: 68 y.o. female with medical history significant for hypertension, hyperlipidemia, hypothyroidism, CKD 3A, COPD, chronic hypoxic respiratory failure, CAD, atrial fibrillation on Eliquis , chronic HFpEF, and ascending aortic aneurysm followed by cardiothoracic surgery who presents with dysuria and left flank pain.   Patient reports that she first developed dysuria and discomfort in the left flank approximately 2 weeks ago.  Symptoms have persisted and she has also developed nausea and loss of appetite.  She has had chills intermittently with this.  She denies any recent chest pain, shortness of breath, or significant cough.   ED Course: Upon arrival to the ED, patient is found to be afebrile and saturating well on room air with normal RR, normal HR, and stable BP.  Labs are most notable for creatinine 2.56 and WBC 21,600.  UA notable for pyuria and bacteriuria with positive nitrites.  Renal ultrasound is notable for chronic partially atrophied right kidney but no acute findings.  Assessment and Plan: 1. AKI superimposed on CKD 3A  - SCr is 2.56 on admission, up from baseline closer to 1.2 in the setting of pyelonephritis and recent anorexia. Atrophic R kidney with concerns of L pyelo  -Cr peaked to 3.47 this admit - noted to have bladder outlet obstruction, now with foley cath - Cont to hold [facility] -Nephrology following. Given concerns of vol overload, see below, have given dose of IV lasix     2. Left pyelonephritis  - Not septic on admission  - Continue Rocephin  - Urine cx demonstrates multiple species. Ordered repeat urine cx   3. Atrial fibrillation  - Continue Eliquis  and diltiazem      4. Acute on Chronic HFpEF  - Now clinically overloaded, requiring 10L [place] o2 this AM  - Hold Jardiance given UTI - Given dose of  lasix  60mg  IV x1 with clinical improvement   5. Hypothyroidism  - Continue Synthroid     6. Chronic hypoxic respiratory failure  - Currently stable on room air - Continue supplemental O2 as-needed    7. Mood disorder  - Continue Prozac     8. Fall - unwitnessed -Ordered and reviewed head CT and B hip xrays. Studies neg for fracture of ICH. -consulted PT/OT      Subjective: Increased sob this AM  Physical Exam: Vitals:   06/17/24 0246 06/17/24 0417 06/17/24 0815 06/17/24 0817  BP:  113/61  131/83  Pulse:  70  71  Resp:  20  (!) 22  Temp:  99.3 F (37.4 C)  98.2 F (36.8 C)  TempSrc:    Oral  SpO2: 95% 91% (!) 89% 93%  Weight:      Height:       General exam: Conversant, in no acute distress Respiratory system: normal chest rise, clear, no audible wheezing Cardiovascular system: regular rhythm, s1-s2 Gastrointestinal system: Nondistended, nontender, pos BS Central nervous system: No seizures, no tremors Extremities: No cyanosis, no joint deformities Skin: No rashes, no pallor Psychiatry: Affect normal // no auditory hallucinations   Data Reviewed:  Labs reviewed: Na 134, K 3.7, Cr 3.39, WBC 18.5, Hgb 11.9, Plts 222  Family Communication: Pt in room, family not at bedside  Disposition: Status is: Inpatient Remains inpatient appropriate because: severity of illness  Planned Discharge Destination: Home with Home Health     Author: Garnette Pelt, MD 06/17/2024 4:29 PM  For on call review [email or web address].

## 2024-06-18 ENCOUNTER — Inpatient Hospital Stay (HOSPITAL_COMMUNITY)

## 2024-06-18 LAB — C4 COMPLEMENT: Complement C4, Body Fluid: 25 mg/dL (ref 12–38)

## 2024-06-18 LAB — C3 COMPLEMENT: C3 Complement: 157 mg/dL (ref 82–167)

## 2024-06-18 LAB — ANA W/REFLEX IF POSITIVE: Anti Nuclear Antibody (ANA): NEGATIVE

## 2024-06-18 LAB — COMPREHENSIVE METABOLIC PANEL WITH GFR
ALT: 50 U/L — ABNORMAL HIGH (ref 0–44)
AST: 56 U/L — ABNORMAL HIGH (ref 15–41)
Albumin: 1.8 g/dL — ABNORMAL LOW (ref 3.5–5.0)
Alkaline Phosphatase: 105 U/L (ref 38–126)
Anion gap: 13 (ref 5–15)
BUN: 48 mg/dL — ABNORMAL HIGH (ref 8–23)
CO2: 20 mmol/L — ABNORMAL LOW (ref 22–32)
Calcium: 8.6 mg/dL — ABNORMAL LOW (ref 8.9–10.3)
Chloride: 103 mmol/L (ref 98–111)
Creatinine, Ser: 3.86 mg/dL — ABNORMAL HIGH (ref 0.44–1.00)
GFR, Estimated: 12 mL/min — ABNORMAL LOW (ref >60)
Glucose, Bld: 134 mg/dL — ABNORMAL HIGH (ref 70–99)
Potassium: 3.6 mmol/L (ref 3.5–5.1)
Sodium: 136 mmol/L (ref 135–145)
Total Bilirubin: 0.6 mg/dL (ref 0.0–1.2)
Total Protein: 5.7 g/dL — ABNORMAL LOW (ref 6.5–8.1)

## 2024-06-18 LAB — HEPATITIS B CORE ANTIBODY, TOTAL: HEP B CORE AB: NEGATIVE

## 2024-06-18 LAB — ANTI-DNA ANTIBODY, DOUBLE-STRANDED: ds DNA Ab: 1 [IU]/mL (ref 0–9)

## 2024-06-18 LAB — CBC
HCT: 36.8 % (ref 36.0–46.0)
Hemoglobin: 11.6 g/dL — ABNORMAL LOW (ref 12.0–15.0)
MCH: 30 pg (ref 26.0–34.0)
MCHC: 31.5 g/dL (ref 30.0–36.0)
MCV: 95.1 fL (ref 80.0–100.0)
Platelets: 224 K/uL (ref 150–400)
RBC: 3.87 MIL/uL (ref 3.87–5.11)
RDW: 15.4 % (ref 11.5–15.5)
WBC: 19.3 K/uL — ABNORMAL HIGH (ref 4.0–10.5)
nRBC: 0.2 % (ref 0.0–0.2)

## 2024-06-18 LAB — PROTEIN ELECTROPHORESIS, SERUM
A/G Ratio: 0.6 — ABNORMAL LOW (ref 0.7–1.7)
Albumin ELP: 2.1 g/dL — ABNORMAL LOW (ref 2.9–4.4)
Alpha-1-Globulin: 0.5 g/dL — ABNORMAL HIGH (ref 0.0–0.4)
Alpha-2-Globulin: 1.3 g/dL — ABNORMAL HIGH (ref 0.4–1.0)
Beta Globulin: 0.8 g/dL (ref 0.7–1.3)
Gamma Globulin: 1.1 g/dL (ref 0.4–1.8)
Globulin, Total: 3.7 g/dL (ref 2.2–3.9)
Total Protein ELP: 5.8 g/dL — ABNORMAL LOW (ref 6.0–8.5)

## 2024-06-18 LAB — MICROALBUMIN / CREATININE URINE RATIO
Creatinine, Urine: 23.8 mg/dL
Microalb Creat Ratio: 222 mg/g{creat} — ABNORMAL HIGH (ref 0–29)
Microalb, Ur: 52.9 ug/mL — ABNORMAL HIGH

## 2024-06-18 LAB — HCV RNA QUANT: HCV Quantitative: NOT DETECTED [IU]/mL (ref >50)

## 2024-06-18 LAB — HIV-1 RNA QUANT-NO REFLEX-BLD
HIV 1 RNA Quant: <20 {copies}/mL
LOG10 HIV-1 RNA: UNDETERMINED {Log_copies}/mL

## 2024-06-18 LAB — ANCA PROFILE
Anti-MPO Antibodies: <0.2 U (ref 0.0–0.9)
Anti-PR3 Antibodies: <0.2 U (ref 0.0–0.9)
Atypical P-ANCA titer: <1:20 {titer}
C-ANCA: <1:20 {titer}
P-ANCA: <1:20 {titer}

## 2024-06-18 LAB — MAGNESIUM: Magnesium: 2.2 mg/dL (ref 1.7–2.4)

## 2024-06-18 MED ORDER — POLYETHYLENE GLYCOL 3350 17 G PO PACK
17.0000 g | PACK | Freq: Two times a day (BID) | ORAL | Status: DC
  Administered 2024-06-18 – 2024-06-26 (×12): 17 g via ORAL
  Filled 2024-06-18 (×12): qty 1

## 2024-06-18 MED ORDER — CHLORHEXIDINE GLUCONATE CLOTH 2 % EX PADS
6.0000 | MEDICATED_PAD | Freq: Every day | CUTANEOUS | Status: DC
  Administered 2024-06-18 – 2024-06-25 (×8): 6 via TOPICAL

## 2024-06-18 NOTE — Progress Notes (Signed)
 Progress Note   Patient: Lauren Clark FMW:[id number] DOB: 25-Sep-1955 DOA: 06/15/2024     3 DOS: the patient was seen and examined on 06/18/2024   Brief hospital course: 68 y.o. female with medical history significant for hypertension, hyperlipidemia, hypothyroidism, CKD 3A, COPD, chronic hypoxic respiratory failure, CAD, atrial fibrillation on Eliquis , chronic HFpEF, and ascending aortic aneurysm followed by cardiothoracic surgery who presents with dysuria and left flank pain.   Patient reports that she first developed dysuria and discomfort in the left flank approximately 2 weeks ago.  Symptoms have persisted and she has also developed nausea and loss of appetite.  She has had chills intermittently with this.  She denies any recent chest pain, shortness of breath, or significant cough.   ED Course: Upon arrival to the ED, patient is found to be afebrile and saturating well on room air with normal RR, normal HR, and stable BP.  Labs are most notable for creatinine 2.56 and WBC 21,600.  UA notable for pyuria and bacteriuria with positive nitrites.  Renal ultrasound is notable for chronic partially atrophied right kidney but no acute findings.  Assessment and Plan: 1. AKI superimposed on CKD 3A  - SCr is 2.56 on admission, up from baseline closer to 1.2 in the setting of pyelonephritis and recent anorexia. Atrophic R kidney with concerns of L pyelo  -Cr peaked to 386 this AM after receiving one dose of IV lasix  on 12/15 - noted to have bladder outlet obstruction, now with foley cath - Cont to hold [facility] -Nephrology following. Holding off on further lasix  for now   2. Left pyelonephritis  - Not septic on admission  - Continue Rocephin  - Urine cx demonstrates multiple species. Ordered repeat urine cx, pending   3. Atrial fibrillation  - Continue Eliquis  and diltiazem      4. Acute on Chronic HFpEF  - Required 10L [place] o2 on 12/15 with increased work of breathing - [employer] given  UTI - Given dose of lasix  60mg  IV x1 with clinical improvement -currently remains on 4LNC. Hold of on further lasix  per Nephrology   5. Hypothyroidism  - Continue Synthroid     6. Chronic hypoxic respiratory failure  - Currently stable on room air - Continue supplemental O2 as-needed    7. Mood disorder  - Continue Prozac     8. Fall - unwitnessed -Ordered and reviewed head CT and B hip xrays. Studies neg for fracture of ICH. -consulted PT/OT with recs noted for HHPT/OT      Subjective: States breathing better today  Physical Exam: Vitals:   06/18/24 0358 06/18/24 0430 06/18/24 0745 06/18/24 1238  BP: (!) 110/54  124/62 123/62  Pulse: 74  77 71  Resp: 20  (!) 22 20  Temp: 97.6 F (36.4 C)  97.9 F (36.6 C) 98.4 F (36.9 C)  TempSrc: Oral  Oral Oral  SpO2: 92%  91% 94%  Weight:  98.9 kg    Height:       General exam: Awake, laying in bed, in nad Respiratory system: Slightly increased respiratory effort but improved compared to yesterday, no wheezing Cardiovascular system: regular rate, s1, s2 Gastrointestinal system: Soft, nondistended, positive BS Central nervous system: CN2-12 grossly intact, strength intact Extremities: Perfused, no clubbing Skin: Normal skin turgor, no notable skin lesions seen Psychiatry: Mood normal // affect normal  Data Reviewed:  Labs reviewed: Na 136, K 3.6, Cr 3.86, WBC 19.3, hgb 11.6, Plts 224  Family Communication: Pt in room, family not at bedside  Disposition: Status is: Inpatient Remains inpatient appropriate because: severity of illness  Planned Discharge Destination: Home with Home Health     Author: Garnette Pelt, MD 06/18/2024 2:24 PM  For on call review [email or web address].

## 2024-06-18 NOTE — Progress Notes (Signed)
 Patient returned from xray at 1843, result pending. MD notified for reading. MD confirmed patient with ileus. Patient reports no pain at this time to abdomen and is requesting ice chips. MD ok for Clears at this time, though if patient reports nausea, vomiting, or increasing abdominal pain with clears she is to be made NPO status again. Patient verbalized understanding. Ice chips provided for comfort.

## 2024-06-18 NOTE — Progress Notes (Signed)
 Physical Therapy Treatment Patient Details Name: Lauren Clark MRN: [id number] DOB: Feb 17, 1956 Today's Date: 06/18/2024   History of Present Illness 68 yo F adm 12/13 UTI with burning. Pt with AKI, L pyelonephrtis. PMH anemia, anxiety, aortic dissection COPD, arthritis, depression, glaucoma, blind L eye, limited vision r eye, obese, PVD, gastric sleeve, R THA, HTN, HLD, CKD III,CAD, afib on eliquis , HFpEF, aortic aneurysm.    PT Comments  Pt progressing towards goals. Currently supervision for bed mobility, sit to stand and CGA for transfers/gait with rollator. Pt utilizes a rollator at home and in community. O2 sats 88% on 4L O2 with gait; pt fatigued after ~ 100 ft of gait with increased trunk flexion. Due to pt current functional status, home set up and available assistance at home recommending skilled physical therapy services 3x/week in order to address strength, balance and functional mobility to decrease risk for falls, injury and re-hospitalization.       If plan is discharge home, recommend the following: Assist for transportation;Assistance with cooking/housework     Equipment Recommendations  None recommended by PT       Precautions / Restrictions Precautions Precautions: Fall;Other (comment) Recall of Precautions/Restrictions: Intact Precaution/Restrictions Comments: watch O2 sats Restrictions Weight Bearing Restrictions Per Provider Order: No     Mobility  Bed Mobility Overal bed mobility: Needs Assistance Bed Mobility: Supine to Sit     Supine to sit: Supervision, HOB elevated     General bed mobility comments: pt in recliner at end of session    Transfers Overall transfer level: Needs assistance Equipment used: Rollator (4 wheels) Transfers: Sit to/from Stand, Bed to chair/wheelchair/BSC Sit to Stand: Supervision   Step pivot transfers: Contact guard assist       General transfer comment: supervision with good hand placement; CGA for safety with steps.     Ambulation/Gait Ambulation/Gait assistance: Contact guard assist Gait Distance (Feet): 150 Feet Assistive device: Rollator (4 wheels) Gait Pattern/deviations: Step-through pattern, Decreased stride length, Trunk flexed Gait velocity: decreased Gait velocity interpretation: 1.31 - 2.62 ft/sec, indicative of limited community ambulator   General Gait Details: intermittent trunk flexion, step through gait pattern; started to slow after 100 ft. Pt O2 sats 88% and HR 78 bpm after gait.      Balance Overall balance assessment: Mild deficits observed, not formally tested        Communication Communication Communication: No apparent difficulties  Cognition Arousal: Alert Behavior During Therapy: WFL for tasks assessed/performed   PT - Cognitive impairments: No apparent impairments     Following commands: Intact      Cueing Cueing Techniques: Verbal cues     General Comments General comments (skin integrity, edema, etc.): VSS on 4L O2 via [place]      Pertinent Vitals/Pain Pain Assessment Pain Assessment: No/denies pain     PT Goals (current goals can now be found in the care plan section) Acute Rehab PT Goals Patient Stated Goal: to return home. PT Goal Formulation: With patient Time For Goal Achievement: 07/01/24 Potential to Achieve Goals: Good Progress towards PT goals: Progressing toward goals    Frequency    Min 2X/week      PT Plan  Continue with current POC        AM-PAC PT 6 Clicks Mobility   Outcome Measure  Help needed turning from your back to your side while in a flat bed without using bedrails?: A Little Help needed moving from lying on your back to sitting on the side of  a flat bed without using bedrails?: A Little Help needed moving to and from a bed to a chair (including a wheelchair)?: A Little Help needed standing up from a chair using your arms (e.g., wheelchair or bedside chair)?: A Little Help needed to walk in hospital room?: A  Little Help needed climbing 3-5 steps with a railing? : A Little 6 Click Score: 18    End of Session Equipment Utilized During Treatment: Gait belt;Oxygen Activity Tolerance: Patient tolerated treatment well Patient left: in chair;with call bell/phone within reach;with chair alarm set Nurse Communication: Mobility status PT Visit Diagnosis: Unsteadiness on feet (R26.81);Other abnormalities of gait and mobility (R26.89);Muscle weakness (generalized) (M62.81)     Time: 8984-8961 PT Time Calculation (min) (ACUTE ONLY): 23 min  Charges:    $Therapeutic Activity: 23-37 mins PT General Charges $$ ACUTE PT VISIT: 1 Visit                    Dorothyann Maier, DPT, CLT  Acute Rehabilitation Services Office: [phone] (Secure chat preferred)    Dorothyann VEAR Maier 06/18/2024, 10:50 AM

## 2024-06-18 NOTE — Progress Notes (Signed)
 [facility] Progress Note    Assessment/ Plan:   AKI on CKD3a -baseline Cr ~1.2-1.3. Has atrophic right kidney/solitary functioning left kidney. -AKI likely secondary to left pyelonephritis with concomitant lasix +jardiance. No obstruction on ultrasound -fluids stopped in the context of SOB/wheezing -seologies pending (checked out of precaution): so far C3 &4 , Hep B WNL.  UPC ~1.7g, UACR ~0.2g -continue with foley for now -Cr did increase after lasix  yesterday, up to 3.86 (albeit good response with lasix ). Hold diuretics for now. Encouraged PO hydration -no indications for renal replacement therapy at this current junction -Avoid nephrotoxic medications including NSAIDs and iodinated intravenous contrast exposure unless the latter is absolutely indicated.  Preferred narcotic agents for pain control are hydromorphone , fentanyl , and methadone. Morphine  should not be used. Avoid Baclofen and avoid oral sodium phosphate  and magnesium  citrate based laxatives / bowel preps. Continue strict Input and Output monitoring. Will monitor the patient closely with you and intervene or adjust therapy as indicated by changes in clinical status/labs   Left pyelonephritis -on rocephin  -per primary service -WBC remains elevated  SOB Chronic respiratory failure -possibly related to COPD? CXR reviewed: no acute findings but some increased interstitial markings--possible vascular congestion. Fluids on hold, s/p lasix  60mg  IV x 1 12/15 -COPD eval per primary  Acute on Chronic HFpEF -hold fluids for now, continue with spot diuresis PRN  Afib -on eliquis  and diltiazem   Ephriam Stank, MD [facility]  Subjective:   Patient seen and examined. Did received lasix  yesterday. UOP 2L. She reports that her breathing improved after lasix . She reports abd discomfort and constipation along with hoarse voice. Denies any chest pain, worsening SOB, N/V, loss of appetite, dysgeusia.    Objective:   BP 124/62 (BP Location: Right Arm)   Pulse 77   Temp 97.9 F (36.6 C) (Oral)   Resp (!) 22   Ht 4' 8 (1.422 m)   Wt 98.9 kg   SpO2 91%   BMI 48.88 kg/m   Intake/Output Summary (Last 24 hours) at 06/18/2024 0912 Last data filed at 06/18/2024 0745 Gross per 24 hour  Intake 1132 ml  Output 2050 ml  Net -918 ml   Weight change:   Physical Exam: Gen:NAD CVS: irreg irreg Resp: decreased breath sounds bibasilar, no w/r/r/c appreciated Abd: obese, soft Ext: nonpitting edema b/l Les Neuro: awake, alert, conversant, no myoclonic jerks observed  Imaging: DG Chest Port 1 View Result Date: 06/17/2024 EXAM: 1 VIEW(S) XRAY OF THE CHEST 06/17/2024 01:18:00 AM COMPARISON: CT chest dated 04/22/2024. CLINICAL HISTORY: Wheezing. FINDINGS: LUNGS AND PLEURA: Mild plate-like scarring in the right mid lung. Increased interstitial markings without frank interstitial edema. No pleural effusion. No pneumothorax. HEART AND MEDIASTINUM: Cardiomegaly. BONES AND SOFT TISSUES: No acute osseous abnormality. IMPRESSION: 1. No acute findings. 2. Cardiomegaly. Electronically signed by: Pinkie Pebbles MD 06/17/2024 01:32 AM EST RP Workstation: [id number]   DG HIP UNILAT WITH PELVIS 1V RIGHT Result Date: 06/16/2024 CLINICAL DATA:  Fall. EXAM: DG HIP (WITH OR WITHOUT PELVIS) 1V RIGHT; DG HIP (WITH OR WITHOUT PELVIS) 1V*L* COMPARISON:  None Available. FINDINGS: There is no evidence of acute hip fracture or dislocation. Mild degenerative changes are present at the left hip. IMPRESSION: Mild degenerative changes at the left hip with no evidence of acute fracture or dislocation. Electronically Signed   By: Leita Birmingham M.D.   On: 06/16/2024 15:20   DG HIP UNILAT WITH PELVIS 1V LEFT Result Date: 06/16/2024 CLINICAL DATA:  Fall. EXAM: DG HIP (WITH OR WITHOUT  PELVIS) 1V RIGHT; DG HIP (WITH OR WITHOUT PELVIS) 1V*L* COMPARISON:  None Available. FINDINGS: There is no evidence of acute hip fracture or  dislocation. Mild degenerative changes are present at the left hip. IMPRESSION: Mild degenerative changes at the left hip with no evidence of acute fracture or dislocation. Electronically Signed   By: Leita Birmingham M.D.   On: 06/16/2024 15:20   CT HEAD WO CONTRAST ( ) Result Date: 06/16/2024 EXAM: CT HEAD WITHOUT 06/16/2024 02:50:24 PM TECHNIQUE: CT of the head was performed without the administration of intravenous contrast. Automated exposure control, iterative reconstruction, and/or weight based adjustment of the mA/kV was utilized to reduce the radiation dose to as low as reasonably achievable. COMPARISON: Head MRI 02/04/2022. CLINICAL HISTORY: Head trauma, minor (Age >= 65y); fall, on eliquis . FINDINGS: BRAIN AND VENTRICLES: There is no evidence of an acute infarct, intracranial hemorrhage, midline shift, hydrocephalus, or extra-axial fluid collection. There is mild cerebral atrophy. Cerebral white matter hypodensities are nonspecific but compatible with mild chronic small vessel ischemic disease. A 1.1 cm calcified extra-axial mass along the anterior falx is unchanged. Calcified atherosclerosis at the skull base. ORBITS: Right cataract extraction. Left phthisis bulbi. SINUSES AND MASTOIDS: No acute abnormality. SOFT TISSUES AND SKULL: No acute skull fracture. No acute soft tissue abnormality. IMPRESSION: 1. No acute intracranial abnormality. 2. Mild chronic small vessel ischemic disease. 3. Unchanged small anterior falcine meningioma. Electronically signed by: Dasie Hamburg MD 06/16/2024 03:10 PM EST RP Workstation: [id number]    Labs: BMET Recent Labs  Lab 06/15/24 0207 06/16/24 0523 06/17/24 0135 06/18/24 0336  NA 136 132* 134* 136  K 3.4* 3.5 3.7 3.6  CL 98 99 103 103  CO2 27 19* 20* 20*  GLUCOSE 152* 123* 112* 134*  BUN 35* 40* 42* 48*  CREATININE 2.56* 3.47* 3.39* 3.86*  CALCIUM  8.7* 8.4* 8.5* 8.6*   CBC Recent Labs  Lab 06/15/24 0207 06/16/24 0523 06/17/24 0135 06/18/24 0336   WBC 21.6* 20.5* 18.5* 19.3*  NEUTROABS 18.6*  --   --   --   HGB 13.2 12.1 11.9* 11.6*  HCT 41.5 38.7 37.6 36.8  MCV 93.9 96.0 95.2 95.1  PLT 251 199 222 224    Medications:     apixaban   5 mg Oral BID   brimonidine   1 drop Right Eye BID   And   timolol   1 drop Right Eye BID   Chlorhexidine  Gluconate Cloth  6 each Topical Daily   FLUoxetine   40 mg Oral Daily   Gerhardt's butt cream   Topical TID   levothyroxine   112 mcg Oral Q0600   sodium chloride  flush  3 mL Intravenous Q12H      Ephriam Stank, MD [facility] 06/18/2024, 9:12 AM

## 2024-06-18 NOTE — Progress Notes (Signed)
 Rounding on patient to confirm delivery of meal tray. Patient had one bite of sandwich, stated she was having 7/10 abdominal pain and nausea with PO intake. Abdominal assessment reveals distension, firmness, and tenderness on palpation to all quadrants. Auscultation of bowel sounds concerning for NEW high-pitched tinkling sounds. Patient reports she has ot passed gas, unable to recall last time she passed gas. RN concerned for ileus, MD notified and STAT order for KUB and NPO status obtained. Patient educated and agreeable to xray, pending transport.

## 2024-06-18 NOTE — Care Management Important Message (Signed)
 Important Message  Patient Details  Name: Lauren Clark MRN: [id number] Date of Birth: 11-26-1955   Important Message Given:  Yes - Medicare IM     Claretta Deed 06/18/2024, 3:04 PM

## 2024-06-19 DIAGNOSIS — N179 Acute kidney failure, unspecified: Secondary | ICD-10-CM | POA: Diagnosis not present

## 2024-06-19 LAB — KAPPA/LAMBDA LIGHT CHAINS
Kappa free light chain: 56.8 mg/L — ABNORMAL HIGH (ref 3.3–19.4)
Kappa, lambda light chain ratio: 1.6 (ref 0.26–1.65)
Lambda free light chains: 35.6 mg/L — ABNORMAL HIGH (ref 5.7–26.3)

## 2024-06-19 LAB — CBC
HCT: 35.9 % — ABNORMAL LOW (ref 36.0–46.0)
Hemoglobin: 11.8 g/dL — ABNORMAL LOW (ref 12.0–15.0)
MCH: 30.8 pg (ref 26.0–34.0)
MCHC: 32.9 g/dL (ref 30.0–36.0)
MCV: 93.7 fL (ref 80.0–100.0)
Platelets: 230 K/uL (ref 150–400)
RBC: 3.83 MIL/uL — ABNORMAL LOW (ref 3.87–5.11)
RDW: 15.5 % (ref 11.5–15.5)
WBC: 17.7 K/uL — ABNORMAL HIGH (ref 4.0–10.5)
nRBC: 0.2 % (ref 0.0–0.2)

## 2024-06-19 LAB — COMPREHENSIVE METABOLIC PANEL WITH GFR
ALT: 58 U/L — ABNORMAL HIGH (ref 0–44)
AST: 57 U/L — ABNORMAL HIGH (ref 15–41)
Albumin: 2.5 g/dL — ABNORMAL LOW (ref 3.5–5.0)
Alkaline Phosphatase: 126 U/L (ref 38–126)
Anion gap: 13 (ref 5–15)
BUN: 52 mg/dL — ABNORMAL HIGH (ref 8–23)
CO2: 19 mmol/L — ABNORMAL LOW (ref 22–32)
Calcium: 9 mg/dL (ref 8.9–10.3)
Chloride: 101 mmol/L (ref 98–111)
Creatinine, Ser: 3.62 mg/dL — ABNORMAL HIGH (ref 0.44–1.00)
GFR, Estimated: 13 mL/min — ABNORMAL LOW (ref >60)
Glucose, Bld: 137 mg/dL — ABNORMAL HIGH (ref 70–99)
Potassium: 3.8 mmol/L (ref 3.5–5.1)
Sodium: 133 mmol/L — ABNORMAL LOW (ref 135–145)
Total Bilirubin: 0.2 mg/dL (ref 0.0–1.2)
Total Protein: 6 g/dL — ABNORMAL LOW (ref 6.5–8.1)

## 2024-06-19 NOTE — Plan of Care (Signed)
°  Problem: Clinical Measurements: Goal: Diagnostic test results will improve Outcome: Progressing   Problem: Clinical Measurements: Goal: Respiratory complications will improve Outcome: Progressing   Problem: Clinical Measurements: Goal: Cardiovascular complication will be avoided Outcome: Progressing   Problem: Elimination: Goal: Will not experience complications related to urinary retention Outcome: Progressing   Problem: Pain Managment: Goal: General experience of comfort will improve and/or be controlled Outcome: Progressing   Problem: Skin Integrity: Goal: Risk for impaired skin integrity will decrease Outcome: Progressing   Problem: Activity: Goal: Risk for activity intolerance will decrease Outcome: Progressing

## 2024-06-19 NOTE — Progress Notes (Signed)
 Progress Note   Patient: Lauren Clark FMW:[id number] DOB: 03-21-1956 DOA: 06/15/2024     4 DOS: the patient was seen and examined on 06/19/2024   Brief hospital course: 68 y.o. female with medical history significant for hypertension, hyperlipidemia, hypothyroidism, CKD 3A, COPD, chronic hypoxic respiratory failure, CAD, atrial fibrillation on Eliquis , chronic HFpEF, and ascending aortic aneurysm followed by cardiothoracic surgery who presents with dysuria and left flank pain.   Patient reports that she first developed dysuria and discomfort in the left flank approximately 2 weeks ago.  Symptoms have persisted and she has also developed nausea and loss of appetite.  She has had chills intermittently with this.  She denies any recent chest pain, shortness of breath, or significant cough.   ED Course: Upon arrival to the ED, patient is found to be afebrile and saturating well on room air with normal RR, normal HR, and stable BP.  Labs are most notable for creatinine 2.56 and WBC 21,600.  UA notable for pyuria and bacteriuria with positive nitrites.  Renal ultrasound is notable for chronic partially atrophied right kidney but no acute findings.  Assessment and Plan: 1. AKI superimposed on CKD 3A  - SCr is 2.56 on admission, up from baseline closer to 1.2 in the setting of pyelonephritis and recent anorexia. Atrophic R kidney with concerns of L pyelo  -Cr peaked to 386 this AM after receiving one dose of IV lasix  on 12/15 - noted to have bladder outlet obstruction, now with foley cath - Cont to hold [facility] -Nephrology following. Continuing to hold off on further lasix  for now   2. Left pyelonephritis  - Not septic on admission  - Continue Rocephin  - Urine cx demonstrates multiple species. Ordered repeat urine cx, pending   3. Atrial fibrillation  - Continue Eliquis  and diltiazem      4. Acute on Chronic HFpEF  - Required 10L [place] o2 on 12/15 with increased work of breathing - [employer] given UTI - Given dose of lasix  60mg  IV x1 with clinical improvement -currently remains on 4LNC. Hold of on further lasix  per Nephrology   5. Hypothyroidism  - Continue Synthroid     6. Acute on Chronic hypoxic respiratory failure  - Continue supplemental O2 as-needed, currently on 4LNC   7. Mood disorder  - Continue Prozac     8. Fall - unwitnessed -Ordered and reviewed head CT and B hip xrays. Studies neg for fracture of ICH. -consulted PT/OT with recs noted for HHPT/OT  9. Ileus - increased abd distension with abd xray confirming adynamic ileus -Pt is tolerating clears, will continue -Pt reports flatus with small BM this AM -Cont supportive care      Subjective: States breathing better today  Physical Exam: Vitals:   06/19/24 0419 06/19/24 0725 06/19/24 0749 06/19/24 1054  BP: (!) 125/59  131/66 (!) 139/53  Pulse: 61  64 (!) 58  Resp: 20  18 17   Temp: 98.5 F (36.9 C)  97.9 F (36.6 C) 98.1 F (36.7 C)  TempSrc: Oral  Oral Oral  SpO2: 94%  94% 94%  Weight:  100.2 kg    Height:       General exam: Conversant, in no acute distress Respiratory system: normal chest rise, clear, no audible wheezing Cardiovascular system: regular rhythm, s1-s2 Gastrointestinal system: Distended, decreased BS Central nervous system: No seizures, no tremors Extremities: No cyanosis, no joint deformities Skin: No rashes, no pallor Psychiatry: Affect normal // no auditory hallucinations   Data Reviewed:  Labs  reviewed: Na 133, K 3.8, Cr 3.62, WBC 17.7, hgb 11.8, Plts 230  Family Communication: Pt in room, family not at bedside  Disposition: Status is: Inpatient Remains inpatient appropriate because: severity of illness  Planned Discharge Destination: Home with Home Health     Author: Garnette Pelt, MD 06/19/2024 2:58 PM  For on call review [email or web address].

## 2024-06-19 NOTE — Plan of Care (Signed)

## 2024-06-19 NOTE — Progress Notes (Signed)
 [facility] Progress Note    Assessment/ Plan:   AKI on CKD3a -baseline Cr ~1.2-1.3. Has atrophic right kidney/solitary functioning left kidney. -AKI likely secondary to left pyelonephritis with concomitant lasix +jardiance with possible BOO. No obstruction on ultrasound -fluids stopped in the context of SOB/wheezing -serologies pending (checked out of precaution): so far C3 &4 , Hep B WNL, ANCA/anti dsDNA/ANA neg, HIV & Hep C neg, no M-Spike.  UPC ~1.7g, UACR ~0.2g (proteinuria likely in the context of AKI, can repeat OP as AKI improves) -continue with foley for now -Cr down to 3.6 today. Can hold on lasix  if able -no indications for renal replacement therapy at this current junction -Avoid nephrotoxic medications including NSAIDs and iodinated intravenous contrast exposure unless the latter is absolutely indicated.  Preferred narcotic agents for pain control are hydromorphone , fentanyl , and methadone. Morphine  should not be used. Avoid Baclofen and avoid oral sodium phosphate  and magnesium  citrate based laxatives / bowel preps. Continue strict Input and Output monitoring. Will monitor the patient closely with you and intervene or adjust therapy as indicated by changes in clinical status/labs   Left pyelonephritis -on rocephin  -per primary service -WBC remains elevated  SOB Chronic respiratory failure -possibly related to COPD? CXR reviewed: no acute findings but some increased interstitial markings--possible vascular congestion. Fluids on hold, s/p lasix  60mg  IV x 1 12/15 -COPD eval per primary  Acute on Chronic HFpEF -hold fluids for now, continue with spot diuresis PRN  Afib -on eliquis .  Diltiazem  on hold  Ileus -per primary service  Ephriam Stank, MD [facility]  Subjective:   Patient seen and examined.  She reports that her breathing is stable (of note, uses 2LNC at home, currently at 4L and stable). Found to have possible ileus per AXR  yesterday. No other complaints.   Objective:   BP 131/66 (BP Location: Left Arm)   Pulse 64   Temp 97.9 F (36.6 C) (Oral)   Resp 18   Ht 4' 8 (1.422 m)   Wt 98.9 kg   SpO2 94%   BMI 48.88 kg/m   Intake/Output Summary (Last 24 hours) at 06/19/2024 9060 Last data filed at 06/19/2024 0400 Gross per 24 hour  Intake 1305 ml  Output 670 ml  Net 635 ml   Weight change:   Physical Exam: Gen:NAD, sitting in recliner CVS: irreg irreg Resp: decreased breath sounds bibasilar, no w/r/r/c appreciated, unlabored Abd: obese, soft, slightly distended Ext: nonpitting edema b/l Les Neuro: awake, alert, conversant, no myoclonic jerks observed  Imaging: DG Abd 1 View Result Date: 06/18/2024 EXAM: 1 VIEW XRAY OF THE ABDOMEN 06/18/2024 06:41:41 PM COMPARISON: CT chest abdomen and pelvis 11/18/2022. CLINICAL HISTORY: Ileus (HCC). FINDINGS: BOWEL: Diffuse gaseous distention of the colon with gas-filled nondistended small bowel. Changes are most likely to indicate adynamic ileus although low colonic obstruction could have this appearance. SOFT TISSUES: Ovoid calcification projecting over the left mid abdomen measuring 9 mm. This could represent a renal or ureteral stone or possibly opaque medication. BONES: Postoperative right hip arthroplasty. Degenerative changes in the spine. No acute fracture. LUNGS: Visualized lung bases are clear. IMPRESSION: 1. Diffuse gaseous distention of the colon with gas-filled nondistended small bowel, most consistent with adynamic ileus. Low colonic obstruction could have this appearance. 2. Ovoid calcification projecting over the left mid abdomen measuring 9 mm, possibly representing a renal or ureteral stone or opaque medication. Electronically signed by: Elsie Gravely MD 06/18/2024 06:47 PM EST RP Workstation: [id number]    Labs: BMET Recent  Labs  Lab 06/15/24 0207 06/16/24 0523 06/17/24 0135 06/18/24 0336 06/19/24 0127  NA 136 132* 134* 136 133*  K 3.4* 3.5  3.7 3.6 3.8  CL 98 99 103 103 101  CO2 27 19* 20* 20* 19*  GLUCOSE 152* 123* 112* 134* 137*  BUN 35* 40* 42* 48* 52*  CREATININE 2.56* 3.47* 3.39* 3.86* 3.62*  CALCIUM  8.7* 8.4* 8.5* 8.6* 9.0   CBC Recent Labs  Lab 06/15/24 0207 06/16/24 0523 06/17/24 0135 06/18/24 0336 06/19/24 0127  WBC 21.6* 20.5* 18.5* 19.3* 17.7*  NEUTROABS 18.6*  --   --   --   --   HGB 13.2 12.1 11.9* 11.6* 11.8*  HCT 41.5 38.7 37.6 36.8 35.9*  MCV 93.9 96.0 95.2 95.1 93.7  PLT 251 199 222 224 230    Medications:     apixaban   5 mg Oral BID   brimonidine   1 drop Right Eye BID   And   timolol   1 drop Right Eye BID   Chlorhexidine  Gluconate Cloth  6 each Topical Daily   FLUoxetine   40 mg Oral Daily   Gerhardt's butt cream   Topical TID   levothyroxine   112 mcg Oral Q0600   polyethylene glycol  17 g Oral BID   sodium chloride  flush  3 mL Intravenous Q12H      Ephriam Stank, MD [facility] 06/19/2024, 9:39 AM

## 2024-06-20 ENCOUNTER — Inpatient Hospital Stay (HOSPITAL_COMMUNITY)

## 2024-06-20 DIAGNOSIS — N179 Acute kidney failure, unspecified: Secondary | ICD-10-CM | POA: Diagnosis not present

## 2024-06-20 DIAGNOSIS — N12 Tubulo-interstitial nephritis, not specified as acute or chronic: Secondary | ICD-10-CM | POA: Diagnosis not present

## 2024-06-20 LAB — CBC
HCT: 37.3 % (ref 36.0–46.0)
Hemoglobin: 11.7 g/dL — ABNORMAL LOW (ref 12.0–15.0)
MCH: 29.8 pg (ref 26.0–34.0)
MCHC: 31.4 g/dL (ref 30.0–36.0)
MCV: 94.9 fL (ref 80.0–100.0)
Platelets: 221 K/uL (ref 150–400)
RBC: 3.93 MIL/uL (ref 3.87–5.11)
RDW: 15.4 % (ref 11.5–15.5)
WBC: 15.4 K/uL — ABNORMAL HIGH (ref 4.0–10.5)
nRBC: 0.2 % (ref 0.0–0.2)

## 2024-06-20 LAB — COMPREHENSIVE METABOLIC PANEL WITH GFR
ALT: 58 U/L — ABNORMAL HIGH (ref 0–44)
AST: 67 U/L — ABNORMAL HIGH (ref 15–41)
Albumin: 2.2 g/dL — ABNORMAL LOW (ref 3.5–5.0)
Alkaline Phosphatase: 128 U/L — ABNORMAL HIGH (ref 38–126)
Anion gap: 12 (ref 5–15)
BUN: 53 mg/dL — ABNORMAL HIGH (ref 8–23)
CO2: 18 mmol/L — ABNORMAL LOW (ref 22–32)
Calcium: 9 mg/dL (ref 8.9–10.3)
Chloride: 100 mmol/L (ref 98–111)
Creatinine, Ser: 3.55 mg/dL — ABNORMAL HIGH (ref 0.44–1.00)
GFR, Estimated: 13 mL/min — ABNORMAL LOW (ref >60)
Glucose, Bld: 108 mg/dL — ABNORMAL HIGH (ref 70–99)
Potassium: 4.7 mmol/L (ref 3.5–5.1)
Sodium: 131 mmol/L — ABNORMAL LOW (ref 135–145)
Total Bilirubin: 0.3 mg/dL (ref 0.0–1.2)
Total Protein: 6.1 g/dL — ABNORMAL LOW (ref 6.5–8.1)

## 2024-06-20 LAB — CULTURE, BLOOD (ROUTINE X 2): Culture: NO GROWTH

## 2024-06-20 NOTE — Progress Notes (Signed)
 Occupational Therapy Treatment Patient Details Name: Lauren Clark MRN: [id number] DOB: Jan 04, 1956 Today's Date: 06/20/2024   History of present illness 68 yo F adm 12/13 UTI with burning. Pt with AKI, L pyelonephrtis. PMH anemia, anxiety, aortic dissection COPD, arthritis, depression, glaucoma, blind L eye, limited vision r eye, obese, PVD, gastric sleeve, R THA, HTN, HLD, CKD III,CAD, afib on eliquis , HFpEF, aortic aneurysm.   OT comments  Patient received in recliner and eager to participate with OT treatment. Patient was able to power up from recliner with supervision and CGA for mobility and transfers for safety. Patient able to stand at sink for grooming tasks and UB bathing but required seated rest break before completing. Discharge recommendations continue to be appropriate. Acute OT to continue to follow to address established goals.       If plan is discharge home, recommend the following:  A little help with walking and/or transfers;A little help with bathing/dressing/bathroom;Assistance with cooking/housework   Equipment Recommendations  None recommended by OT    Recommendations for Other Services      Precautions / Restrictions Precautions Precautions: Fall;Other (comment) Recall of Precautions/Restrictions: Intact Precaution/Restrictions Comments: watch O2 sats Restrictions Weight Bearing Restrictions Per Provider Order: No       Mobility Bed Mobility                    Transfers Overall transfer level: Needs assistance Equipment used: Rolling walker (2 wheels) Transfers: Sit to/from Stand, Bed to chair/wheelchair/BSC Sit to Stand: Supervision           General transfer comment: supervision for sit to stand, CGA for mobility for safety     Balance Overall balance assessment: Mild deficits observed, not formally tested                                         ADL either performed or assessed with clinical judgement   ADL Overall  ADL's : Needs assistance/impaired Eating/Feeding: Set up   Grooming: Wash/dry hands;Wash/dry face;Oral care;Contact guard assist;Standing Grooming Details (indicate cue type and reason): at sink Upper Body Bathing: Contact guard assist;Standing Upper Body Bathing Details (indicate cue type and reason): at sink     Upper Body Dressing : Contact guard assist;Standing Upper Body Dressing Details (indicate cue type and reason): gown for back     Toilet Transfer: Contact guard assist;BSC/3in1;Rolling walker (2 wheels) Toilet Transfer Details (indicate cue type and reason): cues for hand placement                Extremity/Trunk Assessment              Vision   Vision Assessment?: Wears glasses for reading;Wears glasses for driving Additional Comments: vision in right eye only   Perception     Praxis     Communication Communication Communication: No apparent difficulties   Cognition Arousal: Alert Behavior During Therapy: WFL for tasks assessed/performed Cognition: No apparent impairments                               Following commands: Intact        Cueing   Cueing Techniques: Verbal cues  Exercises      Shoulder Instructions       General Comments on 4 liters O2 SpO2 94%    Pertinent Vitals/ Pain  Pain Assessment Pain Assessment: No/denies pain  Home Living                                          Prior Functioning/Environment              Frequency  Min 2X/week        Progress Toward Goals  OT Goals(current goals can now be found in the care plan section)  Progress towards OT goals: Progressing toward goals  Acute Rehab OT Goals Patient Stated Goal: to go home OT Goal Formulation: With patient Time For Goal Achievement: 07/01/24 Potential to Achieve Goals: Good ADL Goals Pt Will Perform Grooming: standing;with supervision Pt Will Perform Upper Body Dressing: sitting;with supervision Pt Will  Perform Lower Body Dressing: with supervision;sit to/from stand Pt Will Transfer to Toilet: with supervision;ambulating Pt Will Perform Toileting - Clothing Manipulation and hygiene: with supervision;sit to/from stand Additional ADL Goal #1: Pt will verbalize 3 strategies for energy conservation during ADL with no cues  Plan      Co-evaluation                 AM-PAC OT 6 Clicks Daily Activity     Outcome Measure   Help from another person eating meals?: None Help from another person taking care of personal grooming?: A Little Help from another person toileting, which includes using toliet, bedpan, or urinal?: A Little Help from another person bathing (including washing, rinsing, drying)?: A Lot Help from another person to put on and taking off regular upper body clothing?: A Little Help from another person to put on and taking off regular lower body clothing?: A Lot 6 Click Score: 17    End of Session Equipment Utilized During Treatment: Rolling walker (2 wheels);Oxygen (4 liters)  OT Visit Diagnosis: Unsteadiness on feet (R26.81);Other abnormalities of gait and mobility (R26.89);Muscle weakness (generalized) (M62.81);History of falling (Z91.81);Low vision, both eyes (H54.2)   Activity Tolerance Patient tolerated treatment well   Patient Left in chair;with call bell/phone within reach;with chair alarm set   Nurse Communication Mobility status;Precautions        Time: 9153-9086 OT Time Calculation (min): 27 min  Charges: OT General Charges $OT Visit: 1 Visit OT Treatments $Self Care/Home Management : 23-37 mins  Lauren Clark, OTA Acute Rehabilitation Services  Office [phone]   Lauren Clark 06/20/2024, 11:05 AM

## 2024-06-20 NOTE — Progress Notes (Signed)
 [facility] Progress Note    Assessment/ Plan:   AKI on CKD3a -baseline Cr ~1.2-1.3. Has atrophic right kidney/solitary functioning left kidney. -AKI likely secondary to left pyelonephritis with concomitant lasix +jardiance with possible BOO. No obstruction on ultrasound. Stopped fluids earlier given concern for vascular congestion on CXR -serologies negative. UPC ~1.7g, UACR ~0.2g (proteinuria likely in the context of AKI, can repeat OP as AKI improves) -continue with foley for now -Cr 3.55 (stable) today. Encouraged PO hydration. No need for lasix  this AM -no indications for renal replacement therapy at this current junction -Avoid nephrotoxic medications including NSAIDs and iodinated intravenous contrast exposure unless the latter is absolutely indicated.  Preferred narcotic agents for pain control are hydromorphone , fentanyl , and methadone. Morphine  should not be used. Avoid Baclofen and avoid oral sodium phosphate  and magnesium  citrate based laxatives / bowel preps. Continue strict Input and Output monitoring. Will monitor the patient closely with you and intervene or adjust therapy as indicated by changes in clinical status/labs   Left pyelonephritis -on rocephin  -per primary service -WBC remains elevated  SOB Chronic respiratory failure -possibly related to COPD? CXR reviewed: no acute findings but some increased interstitial markings--possible vascular congestion. Fluids on hold, s/p lasix  60mg  IV x 1 12/15 -COPD eval per primary  Acute on Chronic HFpEF -hold fluids for now, continue with spot diuresis PRN  Afib -on eliquis .  Diltiazem  on hold  Ileus -per primary service  Elevated LFTs -w/u per primary service  Ephriam Stank, MD [facility]  Subjective:   Patient seen and examined.  No acute events. She reports that her breathing is fine. Frustrated since she is not able to get the amount of water  she needs (not on fluid restriction). Had  a big bowel movement this AM, was slightly painful. No other complaints denies any n/v/dysgeusia/loss of appetite   Objective:   BP 118/73 (BP Location: Left Arm)   Pulse 62   Temp 98.1 F (36.7 C) (Oral)   Resp 18   Ht 4' 8 (1.422 m)   Wt 100.2 kg   SpO2 95%   BMI 49.52 kg/m   Intake/Output Summary (Last 24 hours) at 06/20/2024 0846 Last data filed at 06/20/2024 0741 Gross per 24 hour  Intake 493 ml  Output 725 ml  Net -232 ml   Weight change:   Physical Exam: Gen:NAD, sitting in recliner CVS: irreg irreg Resp: cta bl, no w/r/r/c appreciated, unlabored Abd: obese, soft, slightly distended Ext: nonpitting edema b/l Les Neuro: awake, alert, conversant, no myoclonic jerks observed  Imaging: DG Abd 1 View Result Date: 06/18/2024 EXAM: 1 VIEW XRAY OF THE ABDOMEN 06/18/2024 06:41:41 PM COMPARISON: CT chest abdomen and pelvis 11/18/2022. CLINICAL HISTORY: Ileus (HCC). FINDINGS: BOWEL: Diffuse gaseous distention of the colon with gas-filled nondistended small bowel. Changes are most likely to indicate adynamic ileus although low colonic obstruction could have this appearance. SOFT TISSUES: Ovoid calcification projecting over the left mid abdomen measuring 9 mm. This could represent a renal or ureteral stone or possibly opaque medication. BONES: Postoperative right hip arthroplasty. Degenerative changes in the spine. No acute fracture. LUNGS: Visualized lung bases are clear. IMPRESSION: 1. Diffuse gaseous distention of the colon with gas-filled nondistended small bowel, most consistent with adynamic ileus. Low colonic obstruction could have this appearance. 2. Ovoid calcification projecting over the left mid abdomen measuring 9 mm, possibly representing a renal or ureteral stone or opaque medication. Electronically signed by: Elsie Gravely MD 06/18/2024 06:47 PM EST RP Workstation: [id number]  Labs: BMET Recent Labs  Lab 06/15/24 0207 06/16/24 0523 06/17/24 0135 06/18/24 0336  06/19/24 0127 06/20/24 0143  NA 136 132* 134* 136 133* 131*  K 3.4* 3.5 3.7 3.6 3.8 4.7  CL 98 99 103 103 101 100  CO2 27 19* 20* 20* 19* 18*  GLUCOSE 152* 123* 112* 134* 137* 108*  BUN 35* 40* 42* 48* 52* 53*  CREATININE 2.56* 3.47* 3.39* 3.86* 3.62* 3.55*  CALCIUM  8.7* 8.4* 8.5* 8.6* 9.0 9.0   CBC Recent Labs  Lab 06/15/24 0207 06/16/24 0523 06/17/24 0135 06/18/24 0336 06/19/24 0127 06/20/24 0143  WBC 21.6*   < > 18.5* 19.3* 17.7* 15.4*  NEUTROABS 18.6*  --   --   --   --   --   HGB 13.2   < > 11.9* 11.6* 11.8* 11.7*  HCT 41.5   < > 37.6 36.8 35.9* 37.3  MCV 93.9   < > 95.2 95.1 93.7 94.9  PLT 251   < > 222 224 230 221   < > = values in this interval not displayed.    Medications:     apixaban   5 mg Oral BID   brimonidine   1 drop Right Eye BID   And   timolol   1 drop Right Eye BID   Chlorhexidine  Gluconate Cloth  6 each Topical Daily   FLUoxetine   40 mg Oral Daily   Gerhardt's butt cream   Topical TID   levothyroxine   112 mcg Oral Q0600   polyethylene glycol  17 g Oral BID   sodium chloride  flush  3 mL Intravenous Q12H      Ephriam Stank, MD [facility] 06/20/2024, 8:46 AM

## 2024-06-20 NOTE — Progress Notes (Signed)
 Pt off unit with pt transport in bed. Pt is A.ox4 and on 4LNC.

## 2024-06-20 NOTE — Progress Notes (Signed)
 Physical Therapy Treatment Patient Details Name: Lauren Clark MRN: [id number] DOB: 1956/06/18 Today's Date: 06/20/2024   History of Present Illness 68 yo F adm 12/13 UTI with burning. Pt with AKI, L pyelonephrtis. PMH anemia, anxiety, aortic dissection COPD, arthritis, depression, glaucoma, blind L eye, limited vision r eye, obese, PVD, gastric sleeve, R THA, HTN, HLD, CKD III,CAD, afib on eliquis , HFpEF, aortic aneurysm.    PT Comments  Pt received in supine and agreeable to session. Pt limited by fatigue this session. Pt able to tolerate short gait trial with weakness noted, but no buckling. SpO2 >94% on 4L throughout session. Pt able to perform BLE exercise after seated rest break, but then requests to return to supine. Increased assist to elevate BLE back to EOB. Pt continues to benefit from PT services to progress toward functional mobility goals.     If plan is discharge home, recommend the following: Assist for transportation;Assistance with cooking/housework   Can travel by private vehicle        Equipment Recommendations  None recommended by PT    Recommendations for Other Services       Precautions / Restrictions Precautions Precautions: Fall;Other (comment) Recall of Precautions/Restrictions: Intact Precaution/Restrictions Comments: watch O2 sats Restrictions Weight Bearing Restrictions Per Provider Order: No     Mobility  Bed Mobility Overal bed mobility: Needs Assistance Bed Mobility: Supine to Sit, Sit to Supine     Supine to sit: Supervision, HOB elevated Sit to supine: Mod assist, Used rails   General bed mobility comments: Increased time and effort to sit EOB. Assist to elevate BLE back to EOB and supine scoot to [facility]    Transfers Overall transfer level: Needs assistance Equipment used: Rolling walker (2 wheels) Transfers: Sit to/from Stand Sit to Stand: Supervision           General transfer comment: STS from EOB     Ambulation/Gait Ambulation/Gait assistance: Contact guard assist Gait Distance (Feet): 35 Feet Assistive device: Rolling walker (2 wheels) Gait Pattern/deviations: Step-through pattern, Decreased stride length, Trunk flexed Gait velocity: decreased     General Gait Details: slow, effortful gait with heavy reliance on BUE support. BLE weakness but no buckling noted   Stairs             Wheelchair Mobility     Tilt Bed    Modified Rankin (Stroke Patients Only)       Balance Overall balance assessment: Needs assistance Sitting-balance support: Bilateral upper extremity supported, Feet supported Sitting balance-Leahy Scale: Fair Sitting balance - Comments: EOB   Standing balance support: Bilateral upper extremity supported, During functional activity, Reliant on assistive device for balance Standing balance-Leahy Scale: Poor Standing balance comment: with RW support                            Communication Communication Communication: No apparent difficulties  Cognition Arousal: Alert Behavior During Therapy: WFL for tasks assessed/performed   PT - Cognitive impairments: No apparent impairments                         Following commands: Intact      Cueing Cueing Techniques: Verbal cues  Exercises General Exercises - Lower Extremity Long Arc Quad: AROM, Seated, Both, 10 reps    General Comments General comments (skin integrity, edema, etc.): Pt on 4L throughout session wtih SpO2 >94%      Pertinent Vitals/Pain Pain Assessment Pain Assessment: No/denies  pain     PT Goals (current goals can now be found in the care plan section) Acute Rehab PT Goals Patient Stated Goal: to return home. PT Goal Formulation: With patient Time For Goal Achievement: 07/01/24 Progress towards PT goals: Progressing toward goals    Frequency    Min 2X/week       AM-PAC PT 6 Clicks Mobility   Outcome Measure  Help needed turning from  your back to your side while in a flat bed without using bedrails?: A Little Help needed moving from lying on your back to sitting on the side of a flat bed without using bedrails?: A Little Help needed moving to and from a bed to a chair (including a wheelchair)?: A Little Help needed standing up from a chair using your arms (e.g., wheelchair or bedside chair)?: A Little Help needed to walk in hospital room?: A Little Help needed climbing 3-5 steps with a railing? : A Lot 6 Click Score: 17    End of Session Equipment Utilized During Treatment: Gait belt;Oxygen Activity Tolerance: Patient tolerated treatment well;Patient limited by fatigue Patient left: with call bell/phone within reach;in bed;with bed alarm set Nurse Communication: Mobility status PT Visit Diagnosis: Unsteadiness on feet (R26.81);Other abnormalities of gait and mobility (R26.89);Muscle weakness (generalized) (M62.81)     Time: 8466-8444 PT Time Calculation (min) (ACUTE ONLY): 22 min  Charges:    $Gait Training: 8-22 mins PT General Charges $$ ACUTE PT VISIT: 1 Visit                    Darryle George, PTA Acute Rehabilitation Services Secure Chat Preferred  Office:(336) [phone]    Darryle George 06/20/2024, 4:02 PM

## 2024-06-20 NOTE — Plan of Care (Signed)

## 2024-06-20 NOTE — Progress Notes (Signed)
 Progress Note   Patient: Lauren Clark FMW:[id number] DOB: 1955-12-24 DOA: 06/15/2024     5 DOS: the patient was seen and examined on 06/20/2024   Brief hospital course: 68 y.o. female with medical history significant for hypertension, hyperlipidemia, hypothyroidism, CKD 3A, COPD, chronic hypoxic respiratory failure, CAD, atrial fibrillation on Eliquis , chronic HFpEF, and ascending aortic aneurysm followed by cardiothoracic surgery who presents with dysuria and left flank pain.   Patient reports that she first developed dysuria and discomfort in the left flank approximately 2 weeks ago.  Symptoms have persisted and she has also developed nausea and loss of appetite.  She has had chills intermittently with this.  She denies any recent chest pain, shortness of breath, or significant cough.   ED Course: Upon arrival to the ED, patient is found to be afebrile and saturating well on room air with normal RR, normal HR, and stable BP.  Labs are most notable for creatinine 2.56 and WBC 21,600.  UA notable for pyuria and bacteriuria with positive nitrites.  Renal ultrasound is notable for chronic partially atrophied right kidney but no acute findings.  Assessment and Plan: 1. AKI superimposed on CKD 3A  - SCr is 2.56 on admission, up from baseline closer to 1.2 in the setting of pyelonephritis and recent anorexia. Atrophic R kidney with concerns of L pyelo  -Cr peaked to 3.86 after receiving one dose of IV lasix  on 12/15 - noted to have bladder outlet obstruction, now with foley cath - Cont to hold [facility] -Nephrology following. Continuing to hold off on further lasix  for now -Cr down to 3.55 this AM   2. Left pyelonephritis  - Not septic on admission  - Continue Rocephin  - Urine cx demonstrates multiple species. -WBC improving   3. Atrial fibrillation  - Continue Eliquis  and diltiazem      4. Acute on Chronic HFpEF  - Required 10L [place] o2 on 12/15 with increased work of breathing - Hold  Jardiance given UTI - cont on [place], last noted to be on 2LNC   5. Hypothyroidism  - Continue Synthroid     6. Acute on Chronic hypoxic respiratory failure  - Continue supplemental O2 as-needed, currently on 4LNC   7. Mood disorder  - Continue Prozac     8. Fall - unwitnessed -Ordered and reviewed head CT and B hip xrays. Studies neg for fracture of ICH. -consulted PT/OT with recs noted for HHPT/OT  9. Ileus - increased abd distension with abd xray confirming adynamic ileus -Pt is tolerating clears, will continue -Pt reports flatus with small BM. Pos BS on exam -Cont supportive care  10. Mildly elevated LFT -Since pt is clinically vol overloaded, am suspecting congestive liver disease -Will check RUQ US  -Pt denies localized RUQ pains      Subjective: Without complaints this AM  Physical Exam: Vitals:   06/20/24 0323 06/20/24 0353 06/20/24 0805 06/20/24 1140  BP: (!) 86/60 100/60 118/73 (!) 144/64  Pulse: 60  62 (!) 56  Resp: 18     Temp: 97.8 F (36.6 C)  98.1 F (36.7 C) (!) 97.5 F (36.4 C)  TempSrc: Oral  Oral Oral  SpO2: 94%  95% 95%  Weight:      Height:       General exam: Awake, laying in bed, in nad Respiratory system: Normal respiratory effort, no wheezing Cardiovascular system: regular rate, s1, s2 Gastrointestinal system: Soft, nondistended, positive BS Central nervous system: CN2-12 grossly intact, strength intact Extremities: Perfused, no clubbing Skin: Normal  skin turgor, no notable skin lesions seen Psychiatry: Mood normal // no visual hallucinations   Data Reviewed:  Labs reviewed: Na 131, K 4.7, Cr 3.55, Alk phos 128, AST 67, ALT 58, WBC 15.4, Hgb 11.7, Plts 221  Family Communication: Pt in room, family not at bedside  Disposition: Status is: Inpatient Remains inpatient appropriate because: severity of illness  Planned Discharge Destination: Home with Home Health     Author: Garnette Pelt, MD 06/20/2024 2:42 PM  For on call review  [email or web address].

## 2024-06-21 DIAGNOSIS — N179 Acute kidney failure, unspecified: Secondary | ICD-10-CM | POA: Diagnosis not present

## 2024-06-21 DIAGNOSIS — N12 Tubulo-interstitial nephritis, not specified as acute or chronic: Secondary | ICD-10-CM | POA: Diagnosis not present

## 2024-06-21 LAB — COMPREHENSIVE METABOLIC PANEL WITH GFR
ALT: 73 U/L — ABNORMAL HIGH (ref 0–44)
AST: 81 U/L — ABNORMAL HIGH (ref 15–41)
Albumin: 2.6 g/dL — ABNORMAL LOW (ref 3.5–5.0)
Alkaline Phosphatase: 135 U/L — ABNORMAL HIGH (ref 38–126)
Anion gap: 13 (ref 5–15)
BUN: 55 mg/dL — ABNORMAL HIGH (ref 8–23)
CO2: 19 mmol/L — ABNORMAL LOW (ref 22–32)
Calcium: 9 mg/dL (ref 8.9–10.3)
Chloride: 99 mmol/L (ref 98–111)
Creatinine, Ser: 3.43 mg/dL — ABNORMAL HIGH (ref 0.44–1.00)
GFR, Estimated: 14 mL/min — ABNORMAL LOW
Glucose, Bld: 105 mg/dL — ABNORMAL HIGH (ref 70–99)
Potassium: 4.3 mmol/L (ref 3.5–5.1)
Sodium: 131 mmol/L — ABNORMAL LOW (ref 135–145)
Total Bilirubin: 0.3 mg/dL (ref 0.0–1.2)
Total Protein: 6.2 g/dL — ABNORMAL LOW (ref 6.5–8.1)

## 2024-06-21 LAB — CULTURE, BLOOD (ROUTINE X 2): Culture: NO GROWTH

## 2024-06-21 MED ORDER — FUROSEMIDE 10 MG/ML IJ SOLN
60.0000 mg | Freq: Once | INTRAMUSCULAR | Status: AC
  Administered 2024-06-21: 60 mg via INTRAVENOUS
  Filled 2024-06-21: qty 6

## 2024-06-21 NOTE — Progress Notes (Signed)
 " [facility] Progress Note    Assessment/ Plan:   AKI on CKD3a -baseline Cr ~1.2-1.3. Has atrophic right kidney/solitary functioning left kidney. -AKI likely secondary to left pyelonephritis with concomitant lasix +jardiance with possible BOO. No obstruction on ultrasound. Stopped fluids earlier given concern for vascular congestion on CXR -serologies negative. UPC ~1.7g, UACR ~0.2g (proteinuria likely in the context of AKI, can repeat OP as AKI improves) -continue with foley for now -Cr slowly improving, down to 3.4. Will order a dose of lasix  today as below especially in light of orthopnea -no indications for renal replacement therapy at this current junction -Avoid nephrotoxic medications including NSAIDs and iodinated intravenous contrast exposure unless the latter is absolutely indicated.  Preferred narcotic agents for pain control are hydromorphone , fentanyl , and methadone. Morphine  should not be used. Avoid Baclofen and avoid oral sodium phosphate  and magnesium  citrate based laxatives / bowel preps. Continue strict Input and Output monitoring. Will monitor the patient closely with you and intervene or adjust therapy as indicated by changes in clinical status/labs   Left pyelonephritis -on rocephin  -per primary service -WBC remains elevated, improving 12/18  SOB Chronic respiratory failure -possibly related to COPD? CXR reviewed: no acute findings but some increased interstitial markings--possible vascular congestion. Fluids on hold, s/p lasix  60mg  IV x 1 12/15--repeat today -COPD eval per primary  Acute on Chronic HFpEF -continue to hold fluids for now, continue with spot diuresis PRN--will give a dose of lasix  60mg  IV today  Afib -on eliquis .  Diltiazem  on hold  Ileus -per primary service  Elevated LFTs -w/u per primary service -RUQ u/s with GB sludge and small stones but no cholecystitis, +fatty liver, possible hemangioma -possibly related to rocephin  +/-  congestion. Will assess response after lasix   Hyponatremia -Na 131, difficult to ascertain volume status but suspect her hyponatremia is secondary to both AKI and hypervolemia. Reassess response post lasix  with AM labs  Acidosis -secondary to AKI -mild, no intervention required at this time  Will continue to follow. Discussed with primary service.  Lauren Stank, MD [facility]  Subjective:   Patient seen and examined.  No acute events. She reports that she had a BM overnight. She reports orthopnea.   Objective:   BP (!) 123/57 (BP Location: Right Arm)   Pulse 61   Temp 98.2 F (36.8 C) (Oral)   Resp 20   Ht 4' 8 (1.422 m)   Wt 104.7 kg   SpO2 94%   BMI 51.75 kg/m   Intake/Output Summary (Last 24 hours) at 06/21/2024 0850 Last data filed at 06/20/2024 1821 Gross per 24 hour  Intake 480 ml  Output 275 ml  Net 205 ml   Weight change: 4.5 kg  Physical Exam: Gen:NAD, sitting up in bed eating breakfast CVS: RRR Resp: decreased breath sounds bibasilar, no w/r/r/c appreciated, unlabored Abd: obese, soft, slightly distended Ext: nonpitting edema b/l Les Neuro: awake, alert, conversant, no myoclonic jerks observed  Imaging: US  Abdomen Limited RUQ (LIVER/GB) Result Date: 06/20/2024 CLINICAL DATA:  Elevated LFT. EXAM: ULTRASOUND ABDOMEN LIMITED RIGHT UPPER QUADRANT COMPARISON:  None Available. FINDINGS: Gallbladder: There are sludge and stone within the gallbladder. No bowel wall thickening or pericholecystic fluid. Negative sonographic Murphy's sign. Common bile duct: Diameter: 8 mm Liver: There is diffuse increased liver echogenicity most commonly seen in the setting of fatty infiltration. Superimposed inflammation or fibrosis is not excluded. Clinical correlation is recommended. There is a 1.2 x 1.1 x 1.0 cm echogenic lesion in the right lobe of  the liver which is suboptimally characterized on this ultrasound but the echogenic features suggestive of a  hemangioma. Portal vein is patent on color Doppler imaging with normal direction of blood flow towards the liver. Other: The visualized right kidney appears echogenic and somewhat atrophic. IMPRESSION: 1. Gallbladder sludge and small stones. No sonographic findings of acute cholecystitis. 2. Fatty liver. Echogenic lesion in the right lobe of the liver favored to represent a hemangioma. 3. Atrophic and echogenic right kidney in keeping with chronic kidney disease. Electronically Signed   By: Vanetta Chou M.D.   On: 06/20/2024 20:33    Labs: BMET Recent Labs  Lab 06/15/24 0207 06/16/24 0523 06/17/24 0135 06/18/24 0336 06/19/24 0127 06/20/24 0143 06/21/24 0524  NA 136 132* 134* 136 133* 131* 131*  K 3.4* 3.5 3.7 3.6 3.8 4.7 4.3  CL 98 99 103 103 101 100 99  CO2 27 19* 20* 20* 19* 18* 19*  GLUCOSE 152* 123* 112* 134* 137* 108* 105*  BUN 35* 40* 42* 48* 52* 53* 55*  CREATININE 2.56* 3.47* 3.39* 3.86* 3.62* 3.55* 3.43*  CALCIUM  8.7* 8.4* 8.5* 8.6* 9.0 9.0 9.0   CBC Recent Labs  Lab 06/15/24 0207 06/16/24 0523 06/17/24 0135 06/18/24 0336 06/19/24 0127 06/20/24 0143  WBC 21.6*   < > 18.5* 19.3* 17.7* 15.4*  NEUTROABS 18.6*  --   --   --   --   --   HGB 13.2   < > 11.9* 11.6* 11.8* 11.7*  HCT 41.5   < > 37.6 36.8 35.9* 37.3  MCV 93.9   < > 95.2 95.1 93.7 94.9  PLT 251   < > 222 224 230 221   < > = values in this interval not displayed.    Medications:     apixaban   5 mg Oral BID   brimonidine   1 drop Right Eye BID   And   timolol   1 drop Right Eye BID   Chlorhexidine  Gluconate Cloth  6 each Topical Daily   FLUoxetine   40 mg Oral Daily   Gerhardt's butt cream   Topical TID   levothyroxine   112 mcg Oral Q0600   polyethylene glycol  17 g Oral BID   sodium chloride  flush  3 mL Intravenous Q12H      Lauren Stank, MD [facility] 06/21/2024, 8:50 AM   "

## 2024-06-21 NOTE — Progress Notes (Signed)
 " Progress Note   Patient: Lauren Clark FMW:[id number] DOB: 12-06-1955 DOA: 06/15/2024     6 DOS: the patient was seen and examined on 06/21/2024   Brief hospital course: 68 y.o. female with medical history significant for hypertension, hyperlipidemia, hypothyroidism, CKD 3A, COPD, chronic hypoxic respiratory failure, CAD, atrial fibrillation on Eliquis , chronic HFpEF, and ascending aortic aneurysm followed by cardiothoracic surgery who presents with dysuria and left flank pain.   Patient reports that she first developed dysuria and discomfort in the left flank approximately 2 weeks ago.  Symptoms have persisted and she has also developed nausea and loss of appetite.  She has had chills intermittently with this.  She denies any recent chest pain, shortness of breath, or significant cough.   ED Course: Upon arrival to the ED, patient is found to be afebrile and saturating well on room air with normal RR, normal HR, and stable BP.  Labs are most notable for creatinine 2.56 and WBC 21,600.  UA notable for pyuria and bacteriuria with positive nitrites.  Renal ultrasound is notable for chronic partially atrophied right kidney but no acute findings.  Assessment and Plan: 1. AKI superimposed on CKD 3A  - SCr is 2.56 on admission, up from baseline closer to 1.2 in the setting of pyelonephritis and recent anorexia. Atrophic R kidney with concerns of L pyelo  -Cr peaked to 3.86 after receiving one dose of IV lasix  on 12/15 - noted to have bladder outlet obstruction, now with foley cath - Cont to hold [facility] -Nephrology following. Continuing to hold off on further lasix  for now -Cr down to 3.43. Pt clinically vol overloaded. Nephrology recs for 60mg  IV lasix  noted   2. Left pyelonephritis  - Not septic on admission  - Continue Rocephin , goal to treat total 7 days, through 12/20 - Urine cx demonstrates multiple species. -WBC improving   3. Atrial fibrillation  - Continue Eliquis  and diltiazem       4. Acute on Chronic HFpEF  - Required 10L [place] o2 on 12/15 with increased work of breathing - Hold Jardiance given UTI - cont on [place], last noted to be on 2LNC   5. Hypothyroidism  - Continue Synthroid     6. Acute on Chronic hypoxic respiratory failure  - Continue supplemental O2 as-needed, currently on 2LNC   7. Mood disorder  - Continue Prozac     8. Fall - unwitnessed -Ordered and reviewed head CT and B hip xrays. Studies neg for fracture of ICH. -consulted PT/OT with recs noted for HHPT/OT  9. Ileus - increased abd distension with abd xray confirming adynamic ileus -Pt reports passing flatus and some stool. Pos BS on exam -Pt is tolerating clears, advance to full liquid. Cont to advance as tolerated  10. Mildly elevated LFT -Since pt is clinically vol overloaded, am suspecting congestive liver disease -RUQ US  with findings of sludge and small stones, no evidence of cholecystitis -Pt denies localized RUQ pains. Tolerating PO      Subjective: Reports passing flatus and some stool  Physical Exam: Vitals:   06/21/24 0700 06/21/24 0803 06/21/24 1125 06/21/24 1604  BP:  (!) 123/57 122/61 (!) 118/57  Pulse:  61 62 69  Resp:      Temp:  98.2 F (36.8 C) (!) 97.2 F (36.2 C) 97.9 F (36.6 C)  TempSrc:  Oral Oral   SpO2:  94% 92% 94%  Weight: 104.7 kg     Height:       General exam: Conversant, in no  acute distress Respiratory system: normal chest rise, clear, no audible wheezing Cardiovascular system: regular rhythm, s1-s2 Gastrointestinal system: Nondistended, nontender, pos BS Central nervous system: No seizures, no tremors Extremities: No cyanosis, no joint deformities Skin: No rashes, no pallor Psychiatry: Affect normal // no auditory hallucinations   Data Reviewed:  Labs reviewed: Na 131, K 4.3, Cr 3.43, Alk phos 135, AST 81, ALT 73  Family Communication: Pt in room, family not at bedside  Disposition: Status is: Inpatient Remains inpatient appropriate  because: severity of illness  Planned Discharge Destination: Home with Home Health     Author: Garnette Pelt, MD 06/21/2024 4:53 PM  For on call review [email or web address]  "

## 2024-06-21 NOTE — Plan of Care (Signed)

## 2024-06-22 DIAGNOSIS — N12 Tubulo-interstitial nephritis, not specified as acute or chronic: Secondary | ICD-10-CM | POA: Diagnosis not present

## 2024-06-22 DIAGNOSIS — N179 Acute kidney failure, unspecified: Secondary | ICD-10-CM | POA: Diagnosis not present

## 2024-06-22 LAB — COMPREHENSIVE METABOLIC PANEL WITH GFR
ALT: 77 U/L — ABNORMAL HIGH (ref 0–44)
AST: 88 U/L — ABNORMAL HIGH (ref 15–41)
Albumin: 2.1 g/dL — ABNORMAL LOW (ref 3.5–5.0)
Alkaline Phosphatase: 141 U/L — ABNORMAL HIGH (ref 38–126)
Anion gap: 12 (ref 5–15)
BUN: 58 mg/dL — ABNORMAL HIGH (ref 8–23)
CO2: 19 mmol/L — ABNORMAL LOW (ref 22–32)
Calcium: 8.5 mg/dL — ABNORMAL LOW (ref 8.9–10.3)
Chloride: 99 mmol/L (ref 98–111)
Creatinine, Ser: 3.47 mg/dL — ABNORMAL HIGH (ref 0.44–1.00)
GFR, Estimated: 14 mL/min — ABNORMAL LOW
Glucose, Bld: 111 mg/dL — ABNORMAL HIGH (ref 70–99)
Potassium: 4.3 mmol/L (ref 3.5–5.1)
Sodium: 130 mmol/L — ABNORMAL LOW (ref 135–145)
Total Bilirubin: 0.3 mg/dL (ref 0.0–1.2)
Total Protein: 5.8 g/dL — ABNORMAL LOW (ref 6.5–8.1)

## 2024-06-22 MED ORDER — TORSEMIDE 20 MG PO TABS
40.0000 mg | ORAL_TABLET | Freq: Every day | ORAL | Status: DC
  Administered 2024-06-22: 40 mg via ORAL
  Filled 2024-06-22: qty 2

## 2024-06-22 NOTE — Plan of Care (Signed)

## 2024-06-22 NOTE — Progress Notes (Signed)
 " Progress Note   Patient: Lauren Clark FMW:[id number] DOB: 11-21-1955 DOA: 06/15/2024     7 DOS: the patient was seen and examined on 06/22/2024   Brief hospital course: 68 y.o. female with medical history significant for hypertension, hyperlipidemia, hypothyroidism, CKD 3A, COPD, chronic hypoxic respiratory failure, CAD, atrial fibrillation on Eliquis , chronic HFpEF, and ascending aortic aneurysm followed by cardiothoracic surgery who presents with dysuria and left flank pain.   Patient reports that she first developed dysuria and discomfort in the left flank approximately 2 weeks ago.  Symptoms have persisted and she has also developed nausea and loss of appetite.  She has had chills intermittently with this.  She denies any recent chest pain, shortness of breath, or significant cough.   ED Course: Upon arrival to the ED, patient is found to be afebrile and saturating well on room air with normal RR, normal HR, and stable BP.  Labs are most notable for creatinine 2.56 and WBC 21,600.  UA notable for pyuria and bacteriuria with positive nitrites.  Renal ultrasound is notable for chronic partially atrophied right kidney but no acute findings.  Assessment and Plan: 1. AKI superimposed on CKD 3A  - SCr is 2.56 on admission, up from baseline closer to 1.2 in the setting of pyelonephritis and recent anorexia. Atrophic R kidney with concerns of L pyelo  -Cr peaked to 3.86 on 12/15 - noted to have bladder outlet obstruction, now with foley cath - Cont to hold Jardiance -Nephrology following.  -Pt clinically vol overloaded. S/p of IV lasix , now on torsemide  per Nephro   2. Left pyelonephritis  - Not septic on admission  - Continued Rocephin , goal to treat total 7 days, through 12/20 - Urine cx demonstrates multiple species. -WBC improving. Clinically seems improved from pyelo standpoint   3. Atrial fibrillation  - Continue Eliquis  and diltiazem      4. Acute on Chronic HFpEF  - Required 10L [place]  o2 on 12/15 with increased work of breathing - Will stop Jardiance given presenting UTI - weaned to [facility]   5. Hypothyroidism  - Continue Synthroid     6. Acute on Chronic hypoxic respiratory failure  - Continue supplemental O2 as-needed, currently on 2LNC   7. Mood disorder  - Continue Prozac     8. Fall - unwitnessed -Ordered and reviewed head CT and B hip xrays. Studies neg for fracture of ICH. -consulted PT/OT with recs noted for HHPT/OT  9. Ileus - increased abd distension with abd xray confirming adynamic ileus -Now passing flatus and stool -advancing diet to soft today  10. Mildly elevated LFT -Since pt is clinically vol overloaded, am suspecting congestive liver disease -RUQ US  with findings of sludge and small stones, no evidence of cholecystitis -Pt denies localized RUQ pains. Tolerating PO -Recheck LFT in AM      Subjective: Passing flatus and reporting stools. Tolerating breakfast this AM  Physical Exam: Vitals:   06/22/24 0417 06/22/24 0500 06/22/24 0814 06/22/24 1138  BP: 118/65  (!) 119/44 (!) 109/43  Pulse: (!) 59  61 63  Resp:      Temp: 98.5 F (36.9 C)  97.8 F (36.6 C) 97.9 F (36.6 C)  TempSrc: Oral   Oral  SpO2: 95%  94% 95%  Weight:  104.7 kg    Height:       General exam: Awake, laying in bed, in nad Respiratory system: Normal respiratory effort, no wheezing Cardiovascular system: regular rate, s1, s2 Gastrointestinal system: distended but improving, positive  BS Central nervous system: CN2-12 grossly intact, strength intact Extremities: Perfused, no clubbing Skin: Normal skin turgor, no notable skin lesions seen Psychiatry: Mood normal // affect normal  Data Reviewed:  Labs reviewed: Na 130, K 4.3, Cr 3.47, Alk phos 141, AST 88, ALT 77  Family Communication: Pt in room, family not at bedside  Disposition: Status is: Inpatient Remains inpatient appropriate because: severity of illness  Planned Discharge Destination: Home with Home  Health     Author: Garnette Pelt, MD 06/22/2024 2:16 PM  For on call review [email or web address].  "

## 2024-06-22 NOTE — Progress Notes (Signed)
 " [facility] Progress Note    Assessment/ Plan:   AKI on CKD3a -baseline Cr ~1.2-1.3. Has atrophic right kidney/solitary functioning left kidney. -AKI likely secondary to left pyelonephritis with concomitant lasix +jardiance with possible BOO. No obstruction on ultrasound. Stopped fluids earlier given concern for vascular congestion on CXR -serologies negative. UPC ~1.7g, UACR ~0.2g (proteinuria likely in the context of AKI, can repeat OP as AKI improves) -continue with foley for now -Cr stable, 3.47, remains nonoliguric especially post lasix . Starting torsemide  as below -no indications for renal replacement therapy at this current junction -Avoid nephrotoxic medications including NSAIDs and iodinated intravenous contrast exposure unless the latter is absolutely indicated.  Preferred narcotic agents for pain control are hydromorphone , fentanyl , and methadone. Morphine  should not be used. Avoid Baclofen and avoid oral sodium phosphate  and magnesium  citrate based laxatives / bowel preps. Continue strict Input and Output monitoring. Will monitor the patient closely with you and intervene or adjust therapy as indicated by changes in clinical status/labs   Left pyelonephritis -s/p rocephin  -per primary service -WBC remains elevated, improving 12/18  SOB Chronic respiratory failure -possibly related to COPD? CXR reviewed: no acute findings but some increased interstitial markings--possible vascular congestion. Fluids on hold, s/p lasix  60mg  IV x 1 12/15--repeat today -COPD eval per primary  Acute on Chronic HFpEF -continue to hold fluids for now -start torsemide  40mg  daily  Afib -on eliquis .  Diltiazem  on hold  Ileus -per primary service. Improving   Elevated LFTs -w/u per primary service -RUQ u/s with GB sludge and small stones but no cholecystitis, +fatty liver, possible hemangioma -possibly related to rocephin  +/- congestion. LFT's stable  Hyponatremia -Na 130,  difficult to ascertain volume status but suspect her hyponatremia is secondary to both AKI and hypervolemia. Starting torsemide    Acidosis -secondary to AKI -mild/stable, no intervention required at this time  Will continue to follow  Lauren Stank, MD [facility]  Subjective:   Patient seen and examined.  No acute events. She is rather upset with staff, she reports that they did not come to help her overnight, has not gotten breakfast yet. Stools are more liquid now as per patient Breathing is slightly better   Objective:   BP (!) 119/44 (BP Location: Right Wrist)   Pulse 61   Temp 97.8 F (36.6 C)   Resp 20   Ht 4' 8 (1.422 m)   Wt 104.7 kg   SpO2 94%   BMI 51.74 kg/m   Intake/Output Summary (Last 24 hours) at 06/22/2024 0901 Last data filed at 06/22/2024 0431 Gross per 24 hour  Intake 820 ml  Output 1900 ml  Net -1080 ml   Weight change: -0.01 kg  Physical Exam: Gen:NAD CVS: RRR Resp: cta bl, no w/r/r/c appreciated, unlabored Abd: obese, soft, slightly distended Ext: nonpitting edema b/l Les Neuro: awake, alert, conversant, no myoclonic jerks observed  Imaging: US  Abdomen Limited RUQ (LIVER/GB) Result Date: 06/20/2024 CLINICAL DATA:  Elevated LFT. EXAM: ULTRASOUND ABDOMEN LIMITED RIGHT UPPER QUADRANT COMPARISON:  None Available. FINDINGS: Gallbladder: There are sludge and stone within the gallbladder. No bowel wall thickening or pericholecystic fluid. Negative sonographic Murphy's sign. Common bile duct: Diameter: 8 mm Liver: There is diffuse increased liver echogenicity most commonly seen in the setting of fatty infiltration. Superimposed inflammation or fibrosis is not excluded. Clinical correlation is recommended. There is a 1.2 x 1.1 x 1.0 cm echogenic lesion in the right lobe of the liver which is suboptimally characterized on this ultrasound but the echogenic  features suggestive of a hemangioma. Portal vein is patent on color Doppler imaging with  normal direction of blood flow towards the liver. Other: The visualized right kidney appears echogenic and somewhat atrophic. IMPRESSION: 1. Gallbladder sludge and small stones. No sonographic findings of acute cholecystitis. 2. Fatty liver. Echogenic lesion in the right lobe of the liver favored to represent a hemangioma. 3. Atrophic and echogenic right kidney in keeping with chronic kidney disease. Electronically Signed   By: Vanetta Chou M.D.   On: 06/20/2024 20:33    Labs: BMET Recent Labs  Lab 06/16/24 0523 06/17/24 0135 06/18/24 0336 06/19/24 0127 06/20/24 0143 06/21/24 0524 06/22/24 0547  NA 132* 134* 136 133* 131* 131* 130*  K 3.5 3.7 3.6 3.8 4.7 4.3 4.3  CL 99 103 103 101 100 99 99  CO2 19* 20* 20* 19* 18* 19* 19*  GLUCOSE 123* 112* 134* 137* 108* 105* 111*  BUN 40* 42* 48* 52* 53* 55* 58*  CREATININE 3.47* 3.39* 3.86* 3.62* 3.55* 3.43* 3.47*  CALCIUM  8.4* 8.5* 8.6* 9.0 9.0 9.0 8.5*   CBC Recent Labs  Lab 06/17/24 0135 06/18/24 0336 06/19/24 0127 06/20/24 0143  WBC 18.5* 19.3* 17.7* 15.4*  HGB 11.9* 11.6* 11.8* 11.7*  HCT 37.6 36.8 35.9* 37.3  MCV 95.2 95.1 93.7 94.9  PLT 222 224 230 221    Medications:     apixaban   5 mg Oral BID   brimonidine   1 drop Right Eye BID   And   timolol   1 drop Right Eye BID   Chlorhexidine  Gluconate Cloth  6 each Topical Daily   FLUoxetine   40 mg Oral Daily   Gerhardt's butt cream   Topical TID   levothyroxine   112 mcg Oral Q0600   polyethylene glycol  17 g Oral BID   sodium chloride  flush  3 mL Intravenous Q12H      Lauren Stank, MD [facility] 06/22/2024, 9:01 AM   "

## 2024-06-22 NOTE — Plan of Care (Signed)
" °  Problem: Education: Goal: Knowledge of General Education information will improve Description: Including pain rating scale, medication(s)/side effects and non-pharmacologic comfort measures 06/22/2024 1223 by Bradly Devere LABOR, RN Outcome: Progressing 06/22/2024 1201 by Bradly Devere LABOR, RN Outcome: Progressing   Problem: Health Behavior/Discharge Planning: Goal: Ability to manage health-related needs will improve 06/22/2024 1223 by Bradly Devere LABOR, RN Outcome: Progressing 06/22/2024 1201 by Bradly Devere LABOR, RN Outcome: Progressing   Problem: Clinical Measurements: Goal: Ability to maintain clinical measurements within normal limits will improve 06/22/2024 1223 by Bradly Devere LABOR, RN Outcome: Progressing 06/22/2024 1201 by Bradly Devere LABOR, RN Outcome: Progressing Goal: Will remain free from infection 06/22/2024 1223 by Bradly Devere LABOR, RN Outcome: Progressing 06/22/2024 1201 by Bradly Devere LABOR, RN Outcome: Progressing Goal: Diagnostic test results will improve 06/22/2024 1223 by Bradly Devere LABOR, RN Outcome: Progressing 06/22/2024 1201 by Bradly Devere LABOR, RN Outcome: Progressing Goal: Respiratory complications will improve 06/22/2024 1223 by Bradly Devere LABOR, RN Outcome: Progressing 06/22/2024 1201 by Bradly Devere LABOR, RN Outcome: Progressing Goal: Cardiovascular complication will be avoided 06/22/2024 1223 by Bradly Devere LABOR, RN Outcome: Progressing 06/22/2024 1201 by Bradly Devere LABOR, RN Outcome: Progressing   Problem: Activity: Goal: Risk for activity intolerance will decrease 06/22/2024 1223 by Bradly Devere LABOR, RN Outcome: Progressing 06/22/2024 1201 by Bradly Devere LABOR, RN Outcome: Progressing   Problem: Nutrition: Goal: Adequate nutrition will be maintained 06/22/2024 1223 by Bradly Devere LABOR, RN Outcome: Progressing 06/22/2024 1201 by Bradly Devere LABOR, RN Outcome: Progressing   Problem: Coping: Goal: Level of anxiety will decrease 06/22/2024 1223 by Bradly Devere LABOR, RN Outcome:  Progressing 06/22/2024 1201 by Bradly Devere LABOR, RN Outcome: Progressing   Problem: Elimination: Goal: Will not experience complications related to bowel motility 06/22/2024 1223 by Bradly Devere LABOR, RN Outcome: Progressing 06/22/2024 1201 by Bradly Devere LABOR, RN Outcome: Progressing Goal: Will not experience complications related to urinary retention 06/22/2024 1223 by Bradly Devere LABOR, RN Outcome: Progressing 06/22/2024 1201 by Bradly Devere LABOR, RN Outcome: Progressing   Problem: Pain Managment: Goal: General experience of comfort will improve and/or be controlled 06/22/2024 1223 by Bradly Devere LABOR, RN Outcome: Progressing 06/22/2024 1201 by Bradly Devere LABOR, RN Outcome: Progressing   Problem: Safety: Goal: Ability to remain free from injury will improve 06/22/2024 1223 by Bradly Devere LABOR, RN Outcome: Progressing 06/22/2024 1201 by Bradly Devere LABOR, RN Outcome: Progressing   Problem: Skin Integrity: Goal: Risk for impaired skin integrity will decrease 06/22/2024 1223 by Bradly Devere LABOR, RN Outcome: Progressing 06/22/2024 1201 by Bradly Devere LABOR, RN Outcome: Progressing   "

## 2024-06-23 DIAGNOSIS — N179 Acute kidney failure, unspecified: Secondary | ICD-10-CM | POA: Diagnosis not present

## 2024-06-23 DIAGNOSIS — N1831 Chronic kidney disease, stage 3a: Secondary | ICD-10-CM | POA: Diagnosis not present

## 2024-06-23 LAB — COMPREHENSIVE METABOLIC PANEL WITH GFR
ALT: 87 U/L — ABNORMAL HIGH (ref 0–44)
AST: 90 U/L — ABNORMAL HIGH (ref 15–41)
Albumin: 2.3 g/dL — ABNORMAL LOW (ref 3.5–5.0)
Alkaline Phosphatase: 149 U/L — ABNORMAL HIGH (ref 38–126)
Anion gap: 11 (ref 5–15)
BUN: 62 mg/dL — ABNORMAL HIGH (ref 8–23)
CO2: 23 mmol/L (ref 22–32)
Calcium: 8.3 mg/dL — ABNORMAL LOW (ref 8.9–10.3)
Chloride: 97 mmol/L — ABNORMAL LOW (ref 98–111)
Creatinine, Ser: 3.58 mg/dL — ABNORMAL HIGH (ref 0.44–1.00)
GFR, Estimated: 13 mL/min — ABNORMAL LOW
Glucose, Bld: 132 mg/dL — ABNORMAL HIGH (ref 70–99)
Potassium: 3.7 mmol/L (ref 3.5–5.1)
Sodium: 131 mmol/L — ABNORMAL LOW (ref 135–145)
Total Bilirubin: 0.4 mg/dL (ref 0.0–1.2)
Total Protein: 5.8 g/dL — ABNORMAL LOW (ref 6.5–8.1)

## 2024-06-23 LAB — CBC
HCT: 36.9 % (ref 36.0–46.0)
Hemoglobin: 11.8 g/dL — ABNORMAL LOW (ref 12.0–15.0)
MCH: 29.1 pg (ref 26.0–34.0)
MCHC: 32 g/dL (ref 30.0–36.0)
MCV: 91.1 fL (ref 80.0–100.0)
Platelets: 236 K/uL (ref 150–400)
RBC: 4.05 MIL/uL (ref 3.87–5.11)
RDW: 15.5 % (ref 11.5–15.5)
WBC: 21.3 K/uL — ABNORMAL HIGH (ref 4.0–10.5)
nRBC: 0 % (ref 0.0–0.2)

## 2024-06-23 LAB — MAGNESIUM: Magnesium: 2.4 mg/dL (ref 1.7–2.4)

## 2024-06-23 NOTE — Progress Notes (Signed)
 " [facility] Progress Note    Assessment/ Plan:   AKI on CKD3a -baseline Cr ~1.2-1.3. Has atrophic right kidney/solitary functioning left kidney. -AKI likely secondary to left pyelonephritis with concomitant lasix +jardiance with possible BOO. No obstruction on ultrasound. Stopped fluids earlier given concern for vascular congestion on CXR -serologies negative. UPC ~1.7g, UACR ~0.2g (proteinuria likely in the context of AKI, can repeat OP as AKI improves) -continue with foley for now -Cr 3.58, up today. Hold torsemide  -no indications for renal replacement therapy at this current junction -Avoid nephrotoxic medications including NSAIDs and iodinated intravenous contrast exposure unless the latter is absolutely indicated.  Preferred narcotic agents for pain control are hydromorphone , fentanyl , and methadone. Morphine  should not be used. Avoid Baclofen and avoid oral sodium phosphate  and magnesium  citrate based laxatives / bowel preps. Continue strict Input and Output monitoring. Will monitor the patient closely with you and intervene or adjust therapy as indicated by changes in clinical status/labs   Left pyelonephritis -s/p rocephin  -per primary service -WBC trending up  SOB Chronic respiratory failure -possibly related to COPD? CXR reviewed: no acute findings but some increased interstitial markings--possible vascular congestion. Fluids on hold, diuretics on hold -COPD eval per primary  Acute on Chronic HFpEF -continue to hold fluids for now -start torsemide  40mg  daily  Afib -on eliquis .  Diltiazem  on hold  Ileus -per primary service. Improving   Elevated LFTs -w/u per primary service -RUQ u/s with GB sludge and small stones but no cholecystitis, +fatty liver, possible hemangioma -possibly related to rocephin  +/- congestion. LFT's stable  Hyponatremia -up to 131  Acidosis -secondary to AKI -resolved  Will continue to follow  Ephriam Stank, MD [facility]  Subjective:   Patient seen and examined.  No acute events/complaints Uop ~3.4L   Objective:   BP (!) 92/56 (BP Location: Left Arm)   Pulse 76   Temp 97.7 F (36.5 C) (Oral)   Resp 18   Ht 4' 8 (1.422 m)   Wt 104 kg   SpO2 94%   BMI 51.40 kg/m   Intake/Output Summary (Last 24 hours) at 06/23/2024 1101 Last data filed at 06/23/2024 0845 Gross per 24 hour  Intake 600 ml  Output 3200 ml  Net -2600 ml   Weight change: -0.69 kg  Physical Exam: Gen:NAD CVS: RRR Resp: cta bl, no w/r/r/c appreciated, unlabored Abd: obese, soft, slightly distended Ext: nonpitting edema b/l Les Neuro: awake, alert, conversant, no myoclonic jerks observed  Imaging: No results found.   Labs: BMET Recent Labs  Lab 06/17/24 0135 06/18/24 0336 06/19/24 0127 06/20/24 0143 06/21/24 0524 06/22/24 0547 06/23/24 0225  NA 134* 136 133* 131* 131* 130* 131*  K 3.7 3.6 3.8 4.7 4.3 4.3 3.7  CL 103 103 101 100 99 99 97*  CO2 20* 20* 19* 18* 19* 19* 23  GLUCOSE 112* 134* 137* 108* 105* 111* 132*  BUN 42* 48* 52* 53* 55* 58* 62*  CREATININE 3.39* 3.86* 3.62* 3.55* 3.43* 3.47* 3.58*  CALCIUM  8.5* 8.6* 9.0 9.0 9.0 8.5* 8.3*   CBC Recent Labs  Lab 06/18/24 0336 06/19/24 0127 06/20/24 0143 06/23/24 0225  WBC 19.3* 17.7* 15.4* 21.3*  HGB 11.6* 11.8* 11.7* 11.8*  HCT 36.8 35.9* 37.3 36.9  MCV 95.1 93.7 94.9 91.1  PLT 224 230 221 236    Medications:     apixaban   5 mg Oral BID   brimonidine   1 drop Right Eye BID   And   timolol   1 drop  Right Eye BID   Chlorhexidine  Gluconate Cloth  6 each Topical Daily   FLUoxetine   40 mg Oral Daily   Gerhardt's butt cream   Topical TID   levothyroxine   112 mcg Oral Q0600   polyethylene glycol  17 g Oral BID   sodium chloride  flush  3 mL Intravenous Q12H      Ephriam Stank, MD [facility] 06/23/2024, 11:01 AM   "

## 2024-06-23 NOTE — Progress Notes (Signed)
 " PROGRESS NOTE    Lauren Clark  FMW:[id number] DOB: 04/03/1956 DOA: 06/15/2024 PCP: [name], MD   Brief Narrative:  68 y.o. female with medical history significant for hypertension, hyperlipidemia, hypothyroidism, CKD 3A, COPD, chronic hypoxic respiratory failure, CAD, atrial fibrillation on Eliquis , chronic HFpEF, and ascending aortic aneurysm followed by cardiothoracic surgery who presents with dysuria and left flank pain.   Patient reports that she first developed dysuria and discomfort in the left flank approximately 2 weeks ago.  Symptoms have persisted and she has also developed nausea and loss of appetite.  She has had chills intermittently with this.  She denies any recent chest pain, shortness of breath, or significant cough.   ED Course: Upon arrival to the ED, patient is found to be afebrile and saturating well on room air with normal RR, normal HR, and stable BP.  Labs are most notable for creatinine 2.56 and WBC 21,600.  UA notable for pyuria and bacteriuria with positive nitrites.  Renal ultrasound is notable for chronic partially atrophied right kidney but no acute findings.  Assessment & Plan:   Principal Problem:   Acute renal failure superimposed on stage 3a chronic kidney disease (HCC) Active Problems:   Atrial fibrillation, chronic (HCC)   Essential hypertension   Hypothyroidism   Chronic heart failure with preserved ejection fraction (HFpEF) (HCC)   Pyelonephritis of left kidney  1. AKI superimposed on CKD 3A  - SCr is 2.56 on admission, up from baseline closer to 1.2 in the setting of pyelonephritis and recent anorexia. Atrophic R kidney with concerns of L pyelo  -Cr peaked to 3.86 on 12/15, from where it improved to 3.4 yesterday but up to 3.6 today. noted to have bladder outlet obstruction, now with foley cath, is nonoliguric but creatinine up today, diuretics on hold by nephrology.  Appreciate nephrology help.  Continue to hold Jardiance.   2. Left  pyelonephritis  Completed 7 days of Rocephin  on 06/22/2024.   3. Atrial fibrillation  - Continue Eliquis  and diltiazem , controlled   4.  Acute on chronic hypoxic respiratory failure secondary to acute on Chronic HFpEF  - Uses 2 L of oxygen at baseline however required 10L [place] o2 on 12/15 with increased work of breathing, now back to baseline, Jardiance on hold for now.   5. Hypothyroidism  - Continue Synthroid     7. Mood disorder  - Continue Prozac      8. Fall - unwitnessed -Ordered and reviewed head CT and B hip xrays. Studies neg for fracture of ICH. -consulted PT/OT with recs noted for HHPT/OT   9. Ileus Almost resolved, passing flatus.  Tolerating soft diet.  Continue soft diet for now.   10. Mildly elevated LFT -RUQ US  with findings of sludge and small stones, no evidence of cholecystitis -Pt denies localized RUQ pains. Tolerating PO -Recheck LFT intermittently.  DVT prophylaxis: Eliquis    Code Status: Limited: Do not attempt resuscitation (DNR) -DNR-LIMITED -Do Not Intubate/DNI   Family Communication:  None present at bedside.  Plan of care discussed with patient in length and he/she verbalized understanding and agreed with it.  Status is: Inpatient Remains inpatient appropriate because: Elevated creatinine   Estimated body mass index is 51.4 kg/m as calculated from the following:   Height as of this encounter: 4' 8 (1.422 m).   Weight as of this encounter: 104 kg.    Nutritional Assessment: Body mass index is 51.4 kg/m.[name] Seen by dietician.  I agree with the assessment and  plan as outlined below: Nutrition Status:        . Skin Assessment: I have examined the patient's skin and I agree with the wound assessment as performed by the wound care RN as outlined below:    Consultants:  Nephrology  Procedures:  None  Antimicrobials:  Anti-infectives (From admission, onward)    Start     Dose/Rate Route Frequency Ordered Stop   06/15/24 2200   cefTRIAXone  (ROCEPHIN ) 1 g in sodium chloride  0.9 % 100 mL IVPB        1 g 200 mL/hr over 30 Minutes Intravenous Every 24 hours 06/15/24 0518 06/21/24 2123   06/15/24 0215  cefTRIAXone  (ROCEPHIN ) 1 g in sodium chloride  0.9 % 100 mL IVPB        1 g 200 mL/hr over 30 Minutes Intravenous  Once 06/15/24 0203 06/15/24 9687         Subjective: Patient seen and examined, she is fully alert and oriented, she has no complaints at all.  Plan of care discussed with her.  She had no questions.  Objective: Vitals:   06/23/24 0424 06/23/24 0500 06/23/24 0735 06/23/24 1153  BP: 103/62  (!) 92/56 (!) 98/48  Pulse: 66  76 (!) 55  Resp:   18 18  Temp: 97.8 F (36.6 C)  97.7 F (36.5 C) 97.7 F (36.5 C)  TempSrc:   Oral Oral  SpO2: 90%  94% 95%  Weight:  104 kg    Height:        Intake/Output Summary (Last 24 hours) at 06/23/2024 1202 Last data filed at 06/23/2024 0845 Gross per 24 hour  Intake 600 ml  Output 3200 ml  Net -2600 ml   Filed Weights   06/21/24 0700 06/22/24 0500 06/23/24 0500  Weight: 104.7 kg 104.7 kg 104 kg    Examination:  General exam: Appears calm and comfortable  Respiratory system: Clear to auscultation. Respiratory effort normal. Cardiovascular system: S1 & S2 heard, RRR. No JVD, murmurs, rubs, gallops or clicks.  +2-3 pitting edema bilateral lower extremity. Gastrointestinal system: Abdomen is nondistended, soft and nontender. No organomegaly or masses felt. Normal bowel sounds heard. Central nervous system: Alert and oriented. No focal neurological deficits. Extremities: Symmetric 5 x 5 power. Skin: No rashes, lesions or ulcers Psychiatry: Judgement and insight appear normal. Mood & affect appropriate.    Data Reviewed: I have personally reviewed following labs and imaging studies  CBC: Recent Labs  Lab 06/17/24 0135 06/18/24 0336 06/19/24 0127 06/20/24 0143 06/23/24 0225  WBC 18.5* 19.3* 17.7* 15.4* 21.3*  HGB 11.9* 11.6* 11.8* 11.7* 11.8*  HCT  37.6 36.8 35.9* 37.3 36.9  MCV 95.2 95.1 93.7 94.9 91.1  PLT 222 224 230 221 236   Basic Metabolic Panel: Recent Labs  Lab 06/18/24 0336 06/19/24 0127 06/20/24 0143 06/21/24 0524 06/22/24 0547 06/23/24 0225  NA 136 133* 131* 131* 130* 131*  K 3.6 3.8 4.7 4.3 4.3 3.7  CL 103 101 100 99 99 97*  CO2 20* 19* 18* 19* 19* 23  GLUCOSE 134* 137* 108* 105* 111* 132*  BUN 48* 52* 53* 55* 58* 62*  CREATININE 3.86* 3.62* 3.55* 3.43* 3.47* 3.58*  CALCIUM  8.6* 9.0 9.0 9.0 8.5* 8.3*  MG 2.2  --   --   --   --  2.4   GFR: Estimated Creatinine Clearance: 14.8 mL/min (A) (by C-G formula based on SCr of 3.58 mg/dL (H)). Liver Function Tests: Recent Labs  Lab 06/19/24 0127 06/20/24 0143 06/21/24 9475  06/22/24 0547 06/23/24 0225  AST 57* 67* 81* 88* 90*  ALT 58* 58* 73* 77* 87*  ALKPHOS 126 128* 135* 141* 149*  BILITOT 0.2 0.3 0.3 0.3 0.4  PROT 6.0* 6.1* 6.2* 5.8* 5.8*  ALBUMIN  2.5* 2.2* 2.6* 2.1* 2.3*   No results for input(s): LIPASE, AMYLASE in the last 168 hours. No results for input(s): AMMONIA in the last 168 hours. Coagulation Profile: No results for input(s): INR, PROTIME in the last 168 hours. Cardiac Enzymes: No results for input(s): CKTOTAL, CKMB, CKMBINDEX, TROPONINI in the last 168 hours. BNP (last 3 results) No results for input(s): PROBNP in the last 8760 hours. HbA1C: No results for input(s): HGBA1C in the last 72 hours. CBG: No results for input(s): GLUCAP in the last 168 hours. Lipid Profile: No results for input(s): CHOL, HDL, LDLCALC, TRIG, CHOLHDL, LDLDIRECT in the last 72 hours. Thyroid  Function Tests: No results for input(s): TSH, T4TOTAL, FREET4, T3FREE, THYROIDAB in the last 72 hours. Anemia Panel: No results for input(s): VITAMINB12, FOLATE, FERRITIN, TIBC, IRON, RETICCTPCT in the last 72 hours. Sepsis Labs: Recent Labs  Lab 06/17/24 0135  PROCALCITON 2.53    Recent Results (from the past  240 hours)  Culture, blood (Routine X 2) w Reflex to ID Panel     Status: None   Collection Time: 06/15/24  7:33 AM   Specimen: BLOOD RIGHT FOREARM  Result Value Ref Range Status   Specimen Description BLOOD RIGHT FOREARM  Final   Special Requests   Final    BOTTLES DRAWN AEROBIC ONLY Blood Culture results may not be optimal due to an inadequate volume of blood received in culture bottles   Culture   Final    NO GROWTH 5 DAYS Performed at [facility], [address], [place], [place] [address]    Report Status 06/21/2024 FINAL  Final  Urine Culture (for pregnant, neutropenic or urologic patients or patients with an indwelling urinary catheter)     Status: Abnormal   Collection Time: 06/15/24  7:33 AM   Specimen: Urine, Clean Catch  Result Value Ref Range Status   Specimen Description URINE, CLEAN CATCH  Final   Special Requests   Final    NONE Performed at [facility], [address] [place], [place] [address]    Culture MULTIPLE SPECIES PRESENT, SUGGEST RECOLLECTION (A)  Final   Report Status 06/16/2024 FINAL  Final  Culture, blood (Routine X 2) w Reflex to ID Panel     Status: None   Collection Time: 06/15/24  7:38 AM   Specimen: BLOOD LEFT FOREARM  Result Value Ref Range Status   Specimen Description BLOOD LEFT FOREARM  Final   Special Requests   Final    BOTTLES DRAWN AEROBIC AND ANAEROBIC Blood Culture results may not be optimal due to an inadequate volume of blood received in culture bottles   Culture   Final    NO GROWTH 5 DAYS Performed at [facility], [address], [place], [place] [address]    Report Status 06/20/2024 FINAL  Final     Radiology Studies: No results found.  Scheduled Meds:  apixaban   5 mg Oral BID   brimonidine   1 drop Right Eye BID   And   timolol   1 drop Right Eye BID   Chlorhexidine  Gluconate Cloth  6 each Topical Daily   FLUoxetine   40 mg Oral Daily   Gerhardt's butt cream   Topical TID   levothyroxine   112  mcg Oral  Q0600   polyethylene glycol  17 g Oral BID   sodium chloride  flush  3 mL Intravenous Q12H   Continuous Infusions:   LOS: 8 days   Fredia Skeeter, MD [facility]  06/23/2024, 12:02 PM   *Please note that this is a verbal dictation therefore any spelling or grammatical errors are due to the Dragon Medical One system interpretation.  Please page via Amion and do not message via secure chat for urgent patient care matters. Secure chat can be used for non urgent patient care matters.  How to contact the [facility] Attending or Consulting provider 7A - 7P or covering provider during after hours 7P -7A, for this patient?  Check the care team in [facility] and look for a) attending/consulting [facility] provider listed and b) the [facility] team listed. Page or secure chat 7A-7P. Log into [email or web address] and use [place]'s universal password to access. If you do not have the password, please contact the hospital operator. Locate the [facility] provider you are looking for under [facility] and page to a number that you can be directly reached. If you still have difficulty reaching the provider, please page the [facility] (Director on Call) for the Hospitalists listed on amion for assistance.  "

## 2024-06-23 NOTE — Plan of Care (Signed)
   Problem: Coping: Goal: Level of anxiety will decrease Outcome: Progressing   Problem: Elimination: Goal: Will not experience complications related to urinary retention Outcome: Progressing   Problem: Pain Managment: Goal: General experience of comfort will improve and/or be controlled Outcome: Progressing

## 2024-06-23 NOTE — Plan of Care (Signed)

## 2024-06-24 DIAGNOSIS — N179 Acute kidney failure, unspecified: Secondary | ICD-10-CM | POA: Diagnosis not present

## 2024-06-24 DIAGNOSIS — N1831 Chronic kidney disease, stage 3a: Secondary | ICD-10-CM | POA: Diagnosis not present

## 2024-06-24 LAB — RENAL FUNCTION PANEL
Albumin: 2.5 g/dL — ABNORMAL LOW (ref 3.5–5.0)
Anion gap: 13 (ref 5–15)
BUN: 65 mg/dL — ABNORMAL HIGH (ref 8–23)
CO2: 21 mmol/L — ABNORMAL LOW (ref 22–32)
Calcium: 8.6 mg/dL — ABNORMAL LOW (ref 8.9–10.3)
Chloride: 98 mmol/L (ref 98–111)
Creatinine, Ser: 3.39 mg/dL — ABNORMAL HIGH (ref 0.44–1.00)
GFR, Estimated: 14 mL/min — ABNORMAL LOW
Glucose, Bld: 128 mg/dL — ABNORMAL HIGH (ref 70–99)
Phosphorus: 4.1 mg/dL (ref 2.5–4.6)
Potassium: 4.4 mmol/L (ref 3.5–5.1)
Sodium: 131 mmol/L — ABNORMAL LOW (ref 135–145)

## 2024-06-24 MED ORDER — ALBUMIN HUMAN 25 % IV SOLN
25.0000 g | Freq: Four times a day (QID) | INTRAVENOUS | Status: AC
  Administered 2024-06-24 (×2): 25 g via INTRAVENOUS
  Filled 2024-06-24 (×2): qty 100

## 2024-06-24 MED ORDER — MIDODRINE HCL 5 MG PO TABS
5.0000 mg | ORAL_TABLET | Freq: Three times a day (TID) | ORAL | Status: DC
  Administered 2024-06-24 – 2024-06-26 (×6): 5 mg via ORAL
  Filled 2024-06-24 (×6): qty 1

## 2024-06-24 NOTE — Progress Notes (Signed)
 Occupational Therapy Treatment Patient Details Name: Lauren Clark MRN: [id number] DOB: 06-08-1956 Today's Date: 06/24/2024   History of present illness 68 yo F adm 12/13 UTI with burning. Pt with AKI, L pyelonephrtis. PMH anemia, anxiety, aortic dissection COPD, arthritis, depression, glaucoma, blind L eye, limited vision r eye, obese, PVD, gastric sleeve, R THA, HTN, HLD, CKD III,CAD, afib on eliquis , HFpEF, aortic aneurysm.   OT comments  Patient seated on EOB and asking to use BSC. Patient required cues for hand placement and CGA to transfer to [facility]. Patient required assistance for toilet hygiene due to patient stating she felt unsteady when standing. Patient ambulated to sink and performed self care tasks with 2 seated rest break before ambulating to recliner.  Discharge recommendations continue to be appropriate. Acute OT to continue to follow to address established goals.       If plan is discharge home, recommend the following:  A little help with walking and/or transfers;A little help with bathing/dressing/bathroom;Assistance with cooking/housework   Equipment Recommendations  None recommended by OT    Recommendations for Other Services      Precautions / Restrictions Precautions Precautions: Fall;Other (comment) Recall of Precautions/Restrictions: Intact Precaution/Restrictions Comments: watch O2 sats Restrictions Weight Bearing Restrictions Per Provider Order: No       Mobility Bed Mobility Overal bed mobility: Needs Assistance             General bed mobility comments: seated on EOB upon entry    Transfers Overall transfer level: Needs assistance Equipment used: Rolling walker (2 wheels) Transfers: Bed to chair/wheelchair/BSC, Sit to/from Stand Sit to Stand: Supervision     Step pivot transfers: Contact guard assist     General transfer comment: cues for hand placement     Balance Overall balance assessment: Needs assistance Sitting-balance support:  Bilateral upper extremity supported, Feet supported Sitting balance-Leahy Scale: Fair Sitting balance - Comments: patient was able to eat breakfast seated on EOB   Standing balance support: Single extremity supported, Bilateral upper extremity supported, During functional activity Standing balance-Leahy Scale: Poor Standing balance comment: able to stand at sink for self care tasks but felt unsafe attempting toilet hygiene while standing                           ADL either performed or assessed with clinical judgement   ADL Overall ADL's : Needs assistance/impaired Eating/Feeding: Set up Eating/Feeding Details (indicate cue type and reason): seated on EOB Grooming: Wash/dry hands;Wash/dry face;Oral care;Contact guard assist;Standing Grooming Details (indicate cue type and reason): at sink required seated rest break before completing Upper Body Bathing: Contact guard assist;Standing Upper Body Bathing Details (indicate cue type and reason): at sink Lower Body Bathing: Moderate assistance           Toilet Transfer: Contact guard assist;BSC/3in1;Rolling walker (2 wheels) Toilet Transfer Details (indicate cue type and reason): cues for hand placement Toileting- Clothing Manipulation and Hygiene: Maximal assistance Toileting - Clothing Manipulation Details (indicate cue type and reason): Patient states she felt too unsteady to  attempt toilet hygiene while standing     Functional mobility during ADLs: Contact guard assist;Rolling walker (2 wheels)      Extremity/Trunk Assessment              Vision       Perception     Praxis     Communication Communication Communication: No apparent difficulties   Cognition Arousal: Alert Behavior During Therapy: [facility] for tasks  assessed/performed Cognition: No apparent impairments                               Following commands: Intact        Cueing   Cueing Techniques: Verbal cues  Exercises       Shoulder Instructions       General Comments 2L O2 with 94% SpO2    Pertinent Vitals/ Pain       Pain Assessment Pain Assessment: Faces Faces Pain Scale: Hurts a little bit Pain Location: generalized Pain Descriptors / Indicators: Grimacing Pain Intervention(s): Monitored during session  Home Living                                          Prior Functioning/Environment              Frequency  Min 2X/week        Progress Toward Goals  OT Goals(current goals can now be found in the care plan section)  Progress towards OT goals: Progressing toward goals     Plan      Co-evaluation                 AM-PAC OT 6 Clicks Daily Activity     Outcome Measure   Help from another person eating meals?: None Help from another person taking care of personal grooming?: A Little Help from another person toileting, which includes using toliet, bedpan, or urinal?: A Little Help from another person bathing (including washing, rinsing, drying)?: A Lot Help from another person to put on and taking off regular upper body clothing?: A Little Help from another person to put on and taking off regular lower body clothing?: A Lot 6 Click Score: 17    End of Session Equipment Utilized During Treatment: Gait belt;Rolling walker (2 wheels);Oxygen (2 liters)  OT Visit Diagnosis: Unsteadiness on feet (R26.81);Other abnormalities of gait and mobility (R26.89);Muscle weakness (generalized) (M62.81);History of falling (Z91.81);Low vision, both eyes (H54.2)   Activity Tolerance Patient tolerated treatment well   Patient Left in chair;with call bell/phone within reach;with chair alarm set   Nurse Communication Mobility status;Precautions        Time: 9195-9168 OT Time Calculation (min): 27 min  Charges: OT General Charges $OT Visit: 1 Visit OT Treatments $Self Care/Home Management : 23-37 mins  Dick Laine, OTA Acute Rehabilitation Services  Office  [phone]   Jeb LITTIE Laine 06/24/2024, 10:51 AM

## 2024-06-24 NOTE — Plan of Care (Signed)
" °  Problem: Education: Goal: Knowledge of General Education information will improve Description: Including pain rating scale, medication(s)/side effects and non-pharmacologic comfort measures Outcome: Progressing   Problem: Health Behavior/Discharge Planning: Goal: Ability to manage health-related needs will improve Outcome: Progressing   Problem: Clinical Measurements: Goal: Ability to maintain clinical measurements within normal limits will improve Outcome: Progressing Goal: Will remain free from infection Outcome: Progressing Goal: Diagnostic test results will improve Outcome: Progressing Goal: Respiratory complications will improve Outcome: Progressing Goal: Cardiovascular complication will be avoided Outcome: Progressing   Problem: Education: Goal: Knowledge of General Education information will improve Description: Including pain rating scale, medication(s)/side effects and non-pharmacologic comfort measures Outcome: Progressing   Problem: Activity: Goal: Risk for activity intolerance will decrease Outcome: Progressing   Problem: Coping: Goal: Level of anxiety will decrease Outcome: Progressing   Problem: Elimination: Goal: Will not experience complications related to bowel motility Outcome: Progressing Goal: Will not experience complications related to urinary retention Outcome: Progressing   Problem: Safety: Goal: Ability to remain free from injury will improve Outcome: Progressing   Problem: Skin Integrity: Goal: Risk for impaired skin integrity will decrease Outcome: Progressing   Problem: Pain Managment: Goal: General experience of comfort will improve and/or be controlled Outcome: Progressing   "

## 2024-06-24 NOTE — Progress Notes (Signed)
 " PROGRESS NOTE    Lauren Clark  FMW:[id number] DOB: October 08, 1955 DOA: 06/15/2024 PCP: [name], MD   Brief Narrative:  68 y.o. female with medical history significant for hypertension, hyperlipidemia, hypothyroidism, CKD 3A, COPD, chronic hypoxic respiratory failure, CAD, atrial fibrillation on Eliquis , chronic HFpEF, and ascending aortic aneurysm followed by cardiothoracic surgery who presents with dysuria and left flank pain.   Patient reports that she first developed dysuria and discomfort in the left flank approximately 2 weeks ago.  Symptoms have persisted and she has also developed nausea and loss of appetite.  She has had chills intermittently with this.  She denies any recent chest pain, shortness of breath, or significant cough.   ED Course: Upon arrival to the ED, patient is found to be afebrile and saturating well on room air with normal RR, normal HR, and stable BP.  Labs are most notable for creatinine 2.56 and WBC 21,600.  UA notable for pyuria and bacteriuria with positive nitrites.  Renal ultrasound is notable for chronic partially atrophied right kidney but no acute findings.  Assessment & Plan:   Principal Problem:   Acute renal failure superimposed on stage 3a chronic kidney disease (HCC) Active Problems:   Atrial fibrillation, chronic (HCC)   Essential hypertension   Hypothyroidism   Chronic heart failure with preserved ejection fraction (HFpEF) (HCC)   Pyelonephritis of left kidney  1. AKI superimposed on CKD 3A  - SCr is 2.56 on admission, up from baseline closer to 1.2 in the setting of pyelonephritis and recent anorexia. Atrophic R kidney with concerns of L pyelo  -Cr peaked to 3.86 on 12/15, from where it improved to 3.4 day before yesterday, but went up to 3.6 yesterday, patient's diuretics were held, improved creatinine slightly today. noted to have bladder outlet obstruction, now with foley cath, is nonoliguric.  Appreciate nephrology help.  Continue to  hold Jardiance.  Noted the plan of albumin  by nephrology.   2. Left pyelonephritis  Completed 7 days of Rocephin  on 06/22/2024.   3. Atrial fibrillation  - Continue Eliquis  and diltiazem , controlled   4.  Acute on chronic hypoxic respiratory failure secondary to acute on Chronic HFpEF  - Uses 2 L of oxygen at baseline however required 10L [place] o2 on 12/15 with increased work of breathing, now back to baseline, Jardiance on hold for now.   5. Hypothyroidism  - Continue Synthroid     7. Mood disorder  - Continue Prozac      8. Fall - unwitnessed -Ordered and reviewed head CT and B hip xrays. Studies neg for fracture of ICH. -consulted PT/OT with recs noted for HHPT/OT   9. Ileus Almost resolved, passing flatus.  Tolerating soft diet.  Will advance to regular/renal diet today.   10. Mildly elevated LFT -RUQ US  with findings of sludge and small stones, no evidence of cholecystitis -Pt denies localized RUQ pains. Tolerating PO -Recheck LFT intermittently.  DVT prophylaxis: Eliquis    Code Status: Limited: Do not attempt resuscitation (DNR) -DNR-LIMITED -Do Not Intubate/DNI   Family Communication:  None present at bedside.  Plan of care discussed with patient in length and he/she verbalized understanding and agreed with it.  Status is: Inpatient Remains inpatient appropriate because: Elevated creatinine   Estimated body mass index is 51.4 kg/m as calculated from the following:   Height as of this encounter: 4' 8 (1.422 m).   Weight as of this encounter: 104 kg.    Nutritional Assessment: Body mass index is 51.4  kg/m.. Seen by dietician.  I agree with the assessment and plan as outlined below: Nutrition Status:        . Skin Assessment: I have examined the patient's skin and I agree with the wound assessment as performed by the wound care RN as outlined below:    Consultants:  Nephrology  Procedures:  None  Antimicrobials:  Anti-infectives (From admission,  onward)    Start     Dose/Rate Route Frequency Ordered Stop   06/15/24 2200  cefTRIAXone  (ROCEPHIN ) 1 g in sodium chloride  0.9 % 100 mL IVPB        1 g 200 mL/hr over 30 Minutes Intravenous Every 24 hours 06/15/24 0518 06/21/24 2123   06/15/24 0215  cefTRIAXone  (ROCEPHIN ) 1 g in sodium chloride  0.9 % 100 mL IVPB        1 g 200 mL/hr over 30 Minutes Intravenous  Once 06/15/24 0203 06/15/24 9687         Subjective: Patient seen and examined.  She has no complaints.  Objective: Vitals:   06/24/24 0014 06/24/24 0425 06/24/24 0500 06/24/24 0724  BP: (!) 104/55 (!) 114/52  119/62  Pulse: 62   61  Resp:    18  Temp:  98.1 F (36.7 C)  99.2 F (37.3 C)  TempSrc:  Oral  Oral  SpO2: 94% 94%  94%  Weight:   104 kg   Height:        Intake/Output Summary (Last 24 hours) at 06/24/2024 1016 Last data filed at 06/24/2024 0428 Gross per 24 hour  Intake --  Output 1400 ml  Net -1400 ml   Filed Weights   06/22/24 0500 06/23/24 0500 06/24/24 0500  Weight: 104.7 kg 104 kg 104 kg    Examination:  General exam: Appears calm and comfortable  Respiratory system: Clear to auscultation. Respiratory effort normal. Cardiovascular system: S1 & S2 heard, RRR. No JVD, murmurs, rubs, gallops or clicks.  Nonpitting edema bilateral lower extremity Gastrointestinal system: Abdomen is nondistended, soft and nontender. No organomegaly or masses felt. Normal bowel sounds heard. Central nervous system: Alert and oriented. No focal neurological deficits. Extremities: Symmetric 5 x 5 power. Skin: No rashes, lesions or ulcers.  Psychiatry: Judgement and insight appear normal. Mood & affect appropriate.    Data Reviewed: I have personally reviewed following labs and imaging studies  CBC: Recent Labs  Lab 06/18/24 0336 06/19/24 0127 06/20/24 0143 06/23/24 0225  WBC 19.3* 17.7* 15.4* 21.3*  HGB 11.6* 11.8* 11.7* 11.8*  HCT 36.8 35.9* 37.3 36.9  MCV 95.1 93.7 94.9 91.1  PLT 224 230 221 236    Basic Metabolic Panel: Recent Labs  Lab 06/18/24 0336 06/19/24 0127 06/20/24 0143 06/21/24 0524 06/22/24 0547 06/23/24 0225 06/24/24 0740  NA 136   < > 131* 131* 130* 131* 131*  K 3.6   < > 4.7 4.3 4.3 3.7 4.4  CL 103   < > 100 99 99 97* 98  CO2 20*   < > 18* 19* 19* 23 21*  GLUCOSE 134*   < > 108* 105* 111* 132* 128*  BUN 48*   < > 53* 55* 58* 62* 65*  CREATININE 3.86*   < > 3.55* 3.43* 3.47* 3.58* 3.39*  CALCIUM  8.6*   < > 9.0 9.0 8.5* 8.3* 8.6*  MG 2.2  --   --   --   --  2.4  --   PHOS  --   --   --   --   --   --  4.1   < > = values in this interval not displayed.   GFR: Estimated Creatinine Clearance: 15.7 mL/min (A) (by C-G formula based on SCr of 3.39 mg/dL (H)). Liver Function Tests: Recent Labs  Lab 06/19/24 0127 06/20/24 0143 06/21/24 0524 06/22/24 0547 06/23/24 0225 06/24/24 0740  AST 57* 67* 81* 88* 90*  --   ALT 58* 58* 73* 77* 87*  --   ALKPHOS 126 128* 135* 141* 149*  --   BILITOT 0.2 0.3 0.3 0.3 0.4  --   PROT 6.0* 6.1* 6.2* 5.8* 5.8*  --   ALBUMIN  2.5* 2.2* 2.6* 2.1* 2.3* 2.5*   No results for input(s): LIPASE, AMYLASE in the last 168 hours. No results for input(s): AMMONIA in the last 168 hours. Coagulation Profile: No results for input(s): INR, PROTIME in the last 168 hours. Cardiac Enzymes: No results for input(s): CKTOTAL, CKMB, CKMBINDEX, TROPONINI in the last 168 hours. BNP (last 3 results) No results for input(s): PROBNP in the last 8760 hours. HbA1C: No results for input(s): HGBA1C in the last 72 hours. CBG: No results for input(s): GLUCAP in the last 168 hours. Lipid Profile: No results for input(s): CHOL, HDL, LDLCALC, TRIG, CHOLHDL, LDLDIRECT in the last 72 hours. Thyroid  Function Tests: No results for input(s): TSH, T4TOTAL, FREET4, T3FREE, THYROIDAB in the last 72 hours. Anemia Panel: No results for input(s): VITAMINB12, FOLATE, FERRITIN, TIBC, IRON, RETICCTPCT in the  last 72 hours. Sepsis Labs: No results for input(s): PROCALCITON, LATICACIDVEN in the last 168 hours.   Recent Results (from the past 240 hours)  Culture, blood (Routine X 2) w Reflex to ID Panel     Status: None   Collection Time: 06/15/24  7:33 AM   Specimen: BLOOD RIGHT FOREARM  Result Value Ref Range Status   Specimen Description BLOOD RIGHT FOREARM  Final   Special Requests   Final    BOTTLES DRAWN AEROBIC ONLY Blood Culture results may not be optimal due to an inadequate volume of blood received in culture bottles   Culture   Final    NO GROWTH 5 DAYS Performed at [facility], [address] [place], [place] [address]    Report Status 06/21/2024 FINAL  Final  Urine Culture (for pregnant, neutropenic or urologic patients or patients with an indwelling urinary catheter)     Status: Abnormal   Collection Time: 06/15/24  7:33 AM   Specimen: Urine, Clean Catch  Result Value Ref Range Status   Specimen Description URINE, CLEAN CATCH  Final   Special Requests   Final    NONE Performed at [facility], [address], [place], [place] [address]    Culture MULTIPLE SPECIES PRESENT, SUGGEST RECOLLECTION (A)  Final   Report Status 06/16/2024 FINAL  Final  Culture, blood (Routine X 2) w Reflex to ID Panel     Status: None   Collection Time: 06/15/24  7:38 AM   Specimen: BLOOD LEFT FOREARM  Result Value Ref Range Status   Specimen Description BLOOD LEFT FOREARM  Final   Special Requests   Final    BOTTLES DRAWN AEROBIC AND ANAEROBIC Blood Culture results may not be optimal due to an inadequate volume of blood received in culture bottles   Culture   Final    NO GROWTH 5 DAYS Performed at [facility], [address], [place], [place] [address]    Report Status 06/20/2024 FINAL  Final     Radiology Studies: No results found.  Scheduled Meds:  apixaban   5 mg Oral BID   brimonidine   1 drop Right Eye BID   And   timolol   1 drop Right Eye BID    Chlorhexidine  Gluconate Cloth  6 each Topical Daily   FLUoxetine   40 mg Oral Daily   Gerhardt's butt cream   Topical TID   levothyroxine   112 mcg Oral Q0600   midodrine   5 mg Oral TID WC   polyethylene glycol  17 g Oral BID   sodium chloride  flush  3 mL Intravenous Q12H   Continuous Infusions:  albumin  human       LOS: 9 days   Fredia Skeeter, MD [facility]  06/24/2024, 10:16 AM   *Please note that this is a verbal dictation therefore any spelling or grammatical errors are due to the Dragon Medical One system interpretation.  Please page via Amion and do not message via secure chat for urgent patient care matters. Secure chat can be used for non urgent patient care matters.  How to contact the [facility] Attending or Consulting provider 7A - 7P or covering provider during after hours 7P -7A, for this patient?  Check the care team in [facility] and look for a) attending/consulting [facility] provider listed and b) the [facility] team listed. Page or secure chat 7A-7P. Log into [email or web address] and use 's universal password to access. If you do not have the password, please contact the hospital operator. Locate the [facility] provider you are looking for under [facility] and page to a number that you can be directly reached. If you still have difficulty reaching the provider, please page the [facility] (Director on Call) for the Hospitalists listed on amion for assistance.  "

## 2024-06-24 NOTE — Progress Notes (Signed)
 " [facility] Progress Note    Assessment/ Plan:   AKI on CKD3a -baseline Cr ~1.2-1.3. Has atrophic right kidney/solitary functioning left kidney. -AKI likely secondary to left pyelonephritis with concomitant lasix +jardiance. - Serologies were negative earlier in the course - Creatinine slightly improved today to 3.4 - Will give albumin  25 g x 2 doses and start midodrine  5 mg 3 times daily given low blood pressures - If creatinine steadily improving tomorrow we may sign off and arrange outpatient follow-up  Left pyelonephritis -s/p rocephin  -per primary service  SOB Chronic respiratory failure -possibly related to COPD? CXR reviewed: no acute findings but some increased interstitial markings--possible vascular congestion. Fluids on hold, diuretics on hold -COPD eval per primary  Acute on Chronic HFpEF - Some volume excess during the hospitalization.  Volume status seems improved at this time.  Holding diuretics  Afib -on eliquis .  Diltiazem  on hold  Ileus -per primary service. Improving   Elevated LFTs -RUQ u/s with GB sludge and small stones but no cholecystitis, +fatty liver, possible hemangioma -possibly related to rocephin  +/- congestion.  Hyponatremia - Secondary to AKI with some free water  retention    Subjective:   Patient feels well today with no complaints.  Good urine output.  Creatinine slightly downtrending.   Objective:   BP 119/62 (BP Location: Left Arm)   Pulse 61   Temp 99.2 F (37.3 C) (Oral)   Resp 18   Ht 4' 8 (1.422 m)   Wt 104 kg   SpO2 94%   BMI 51.40 kg/m   Intake/Output Summary (Last 24 hours) at 06/24/2024 9097 Last data filed at 06/24/2024 0428 Gross per 24 hour  Intake --  Output 1400 ml  Net -1400 ml   Weight change: 0 kg  Physical Exam: Gen:NAD, lying in bed CVS: Normal rate, no rub Resp: Bilateral chest rise with no increased work of breathing Abd: obese, soft, slightly distended Ext: nonpitting edema  b/l Les Neuro: awake, alert, conversant  Imaging: No results found.   Labs: BMET Recent Labs  Lab 06/18/24 0336 06/19/24 0127 06/20/24 0143 06/21/24 0524 06/22/24 0547 06/23/24 0225 06/24/24 0740  NA 136 133* 131* 131* 130* 131* 131*  K 3.6 3.8 4.7 4.3 4.3 3.7 4.4  CL 103 101 100 99 99 97* 98  CO2 20* 19* 18* 19* 19* 23 21*  GLUCOSE 134* 137* 108* 105* 111* 132* 128*  BUN 48* 52* 53* 55* 58* 62* 65*  CREATININE 3.86* 3.62* 3.55* 3.43* 3.47* 3.58* 3.39*  CALCIUM  8.6* 9.0 9.0 9.0 8.5* 8.3* 8.6*  PHOS  --   --   --   --   --   --  4.1   CBC Recent Labs  Lab 06/18/24 0336 06/19/24 0127 06/20/24 0143 06/23/24 0225  WBC 19.3* 17.7* 15.4* 21.3*  HGB 11.6* 11.8* 11.7* 11.8*  HCT 36.8 35.9* 37.3 36.9  MCV 95.1 93.7 94.9 91.1  PLT 224 230 221 236    Medications:     apixaban   5 mg Oral BID   brimonidine   1 drop Right Eye BID   And   timolol   1 drop Right Eye BID   Chlorhexidine  Gluconate Cloth  6 each Topical Daily   FLUoxetine   40 mg Oral Daily   Gerhardt's butt cream   Topical TID   levothyroxine   112 mcg Oral Q0600   midodrine   5 mg Oral TID WC   polyethylene glycol  17 g Oral BID   sodium chloride  flush  3  mL Intravenous Q12H      "

## 2024-06-24 NOTE — Plan of Care (Signed)
  Problem: Safety: Goal: Ability to remain free from injury will improve Outcome: Not Progressing   

## 2024-06-24 NOTE — Progress Notes (Signed)
 Physical Therapy Treatment Patient Details Name: Lauren Clark MRN: [id number] DOB: Oct 23, 1955 Today's Date: 06/24/2024   History of Present Illness 68 yo F adm 12/13 UTI with burning. Pt with AKI, L pyelonephrtis. PMH anemia, anxiety, aortic dissection COPD, arthritis, depression, glaucoma, blind L eye, limited vision r eye, obese, PVD, gastric sleeve, R THA, HTN, HLD, CKD III,CAD, afib on eliquis , HFpEF, aortic aneurysm.    PT Comments  Pt currently limited by fatigue. Utilized RW due to pt LE weakness and encourage do to use at home. Pt with weakness noted in her L Hip with trendelenburg gait.  Pt is Min A for bed mobility, supervision for sit to stand and CGA to Min A for gait due to LE weakness and fatigue. Pt prefers to go home. Due to pt current functional status, home set up and available assistance at home recommending skilled physical therapy services 3x/week in order to address strength, balance and functional mobility to decrease risk for falls, injury and re-hospitalization.      If plan is discharge home, recommend the following: Assist for transportation;Assistance with cooking/housework     Equipment Recommendations  None recommended by PT       Precautions / Restrictions Precautions Precautions: Fall;Other (comment) Recall of Precautions/Restrictions: Intact Precaution/Restrictions Comments: watch O2 sats Restrictions Weight Bearing Restrictions Per Provider Order: No     Mobility  Bed Mobility Overal bed mobility: Needs Assistance Bed Mobility: Sit to Supine       Sit to supine: Min assist   General bed mobility comments: in recliner on arrival    Transfers Overall transfer level: Needs assistance Equipment used: Rolling walker (2 wheels) Transfers: Sit to/from Stand Sit to Stand: Supervision           General transfer comment: good hand placement.    Ambulation/Gait Ambulation/Gait assistance: Contact guard assist, Min assist Gait Distance  (Feet): 30 Feet Assistive device: Rolling walker (2 wheels) Gait Pattern/deviations: Step-through pattern, Decreased stride length, Trunk flexed Gait velocity: decreased Gait velocity interpretation: <1.31 ft/sec, indicative of household ambulator   General Gait Details: slow, effortful gait with heavy reliance on BUE support. BLE weakness but no buckling noted     Balance Overall balance assessment: Needs assistance Sitting-balance support: Bilateral upper extremity supported, Feet supported Sitting balance-Leahy Scale: Good     Standing balance support: Single extremity supported, Bilateral upper extremity supported, During functional activity Standing balance-Leahy Scale: Poor Standing balance comment: heavy reliance on UE during gait      Communication Communication Communication: No apparent difficulties  Cognition Arousal: Alert Behavior During Therapy: WFL for tasks assessed/performed   PT - Cognitive impairments: No apparent impairments       Following commands: Intact      Cueing Cueing Techniques: Verbal cues     General Comments General comments (skin integrity, edema, etc.): O2 sats remained in the mid 90s on 4L O2 via [place].      Pertinent Vitals/Pain Pain Assessment Pain Assessment: 0-10 Pain Score: 5  Pain Location: generalized Pain Descriptors / Indicators: Discomfort Pain Intervention(s): Monitored during session, Limited activity within patient's tolerance     PT Goals (current goals can now be found in the care plan section) Acute Rehab PT Goals Patient Stated Goal: to return home. PT Goal Formulation: With patient Time For Goal Achievement: 07/01/24 Potential to Achieve Goals: Good Progress towards PT goals: Progressing toward goals    Frequency    Min 2X/week      PT Plan  Continue with  current POC        AM-PAC PT 6 Clicks Mobility   Outcome Measure  Help needed turning from your back to your side while in a flat bed without  using bedrails?: A Little Help needed moving from lying on your back to sitting on the side of a flat bed without using bedrails?: A Little Help needed moving to and from a bed to a chair (including a wheelchair)?: A Little Help needed standing up from a chair using your arms (e.g., wheelchair or bedside chair)?: A Little Help needed to walk in hospital room?: A Little Help needed climbing 3-5 steps with a railing? : A Lot 6 Click Score: 17    End of Session Equipment Utilized During Treatment: Gait belt;Oxygen Activity Tolerance: Patient tolerated treatment well;Patient limited by fatigue Patient left: with call bell/phone within reach;in bed;with bed alarm set Nurse Communication: Mobility status PT Visit Diagnosis: Unsteadiness on feet (R26.81);Other abnormalities of gait and mobility (R26.89);Muscle weakness (generalized) (M62.81)     Time: 8788-8769 PT Time Calculation (min) (ACUTE ONLY): 19 min  Charges:    $Therapeutic Activity: 8-22 mins PT General Charges $$ ACUTE PT VISIT: 1 Visit                     Lauren Clark, DPT, CLT  Acute Rehabilitation Services Office: [phone] (Secure chat preferred)    Lauren Clark 06/24/2024, 12:44 PM

## 2024-06-25 DIAGNOSIS — N1831 Chronic kidney disease, stage 3a: Secondary | ICD-10-CM | POA: Diagnosis not present

## 2024-06-25 DIAGNOSIS — N179 Acute kidney failure, unspecified: Secondary | ICD-10-CM | POA: Diagnosis not present

## 2024-06-25 LAB — COMPREHENSIVE METABOLIC PANEL WITH GFR
ALT: 90 U/L — ABNORMAL HIGH (ref 0–44)
AST: 89 U/L — ABNORMAL HIGH (ref 15–41)
Albumin: 2.8 g/dL — ABNORMAL LOW (ref 3.5–5.0)
Alkaline Phosphatase: 114 U/L (ref 38–126)
Anion gap: 12 (ref 5–15)
BUN: 69 mg/dL — ABNORMAL HIGH (ref 8–23)
CO2: 22 mmol/L (ref 22–32)
Calcium: 8.4 mg/dL — ABNORMAL LOW (ref 8.9–10.3)
Chloride: 98 mmol/L (ref 98–111)
Creatinine, Ser: 3.2 mg/dL — ABNORMAL HIGH (ref 0.44–1.00)
GFR, Estimated: 15 mL/min — ABNORMAL LOW
Glucose, Bld: 112 mg/dL — ABNORMAL HIGH (ref 70–99)
Potassium: 3.8 mmol/L (ref 3.5–5.1)
Sodium: 132 mmol/L — ABNORMAL LOW (ref 135–145)
Total Bilirubin: 0.4 mg/dL (ref 0.0–1.2)
Total Protein: 6.2 g/dL — ABNORMAL LOW (ref 6.5–8.1)

## 2024-06-25 LAB — CBC WITH DIFFERENTIAL/PLATELET
Abs Immature Granulocytes: 0.43 K/uL — ABNORMAL HIGH (ref 0.00–0.07)
Basophils Absolute: 0.1 K/uL (ref 0.0–0.1)
Basophils Relative: 0 %
Eosinophils Absolute: 0.2 K/uL (ref 0.0–0.5)
Eosinophils Relative: 1 %
HCT: 34.4 % — ABNORMAL LOW (ref 36.0–46.0)
Hemoglobin: 11.2 g/dL — ABNORMAL LOW (ref 12.0–15.0)
Immature Granulocytes: 2 %
Lymphocytes Relative: 4 %
Lymphs Abs: 0.8 K/uL (ref 0.7–4.0)
MCH: 29.6 pg (ref 26.0–34.0)
MCHC: 32.6 g/dL (ref 30.0–36.0)
MCV: 90.8 fL (ref 80.0–100.0)
Monocytes Absolute: 1.7 K/uL — ABNORMAL HIGH (ref 0.1–1.0)
Monocytes Relative: 9 %
Neutro Abs: 15.7 K/uL — ABNORMAL HIGH (ref 1.7–7.7)
Neutrophils Relative %: 84 %
Platelets: 248 K/uL (ref 150–400)
RBC: 3.79 MIL/uL — ABNORMAL LOW (ref 3.87–5.11)
RDW: 15.6 % — ABNORMAL HIGH (ref 11.5–15.5)
WBC: 18.9 K/uL — ABNORMAL HIGH (ref 4.0–10.5)
nRBC: 0 % (ref 0.0–0.2)

## 2024-06-25 MED ORDER — ROSUVASTATIN CALCIUM 20 MG PO TABS
40.0000 mg | ORAL_TABLET | Freq: Every morning | ORAL | Status: DC
  Administered 2024-06-25 – 2024-06-26 (×2): 40 mg via ORAL
  Filled 2024-06-25 (×2): qty 2

## 2024-06-25 MED ORDER — SUVOREXANT 10 MG PO TABS: 1.0000 | ORAL_TABLET | Freq: Every day | ORAL | Status: DC

## 2024-06-25 MED ORDER — VITAMIN B-12 100 MCG PO TABS
100.0000 ug | ORAL_TABLET | Freq: Every morning | ORAL | Status: DC
  Administered 2024-06-25 – 2024-06-26 (×2): 100 ug via ORAL
  Filled 2024-06-25 (×2): qty 1

## 2024-06-25 MED ORDER — ALBUMIN HUMAN 25 % IV SOLN
25.0000 g | Freq: Four times a day (QID) | INTRAVENOUS | Status: AC
  Administered 2024-06-25 (×2): 25 g via INTRAVENOUS
  Filled 2024-06-25 (×2): qty 100

## 2024-06-25 NOTE — Progress Notes (Signed)
 " [facility] Progress Note    Assessment/ Plan:   AKI on CKD3a -baseline Cr ~1.2-1.3. Has atrophic right kidney/solitary functioning left kidney. -AKI likely secondary to left pyelonephritis with concomitant lasix +jardiance. - Serologies were negative earlier in the course - Creatinine slowly and steadily improving down to 3.2 today - Given steady improvement in creatinine I think she is okay for discharge from nephrology perspective.  Expect that she will likely continue to improve slowly with time.  Would continue midodrine  5 mg 3 times daily at discharge and stop outpatient once blood pressure improves further.  We will set up follow-up in our office  Left pyelonephritis -s/p rocephin  -per primary service  SOB Chronic respiratory failure -possibly related to COPD? CXR reviewed: no acute findings but some increased interstitial markings--possible vascular congestion.  Overall improved -COPD eval per primary  Acute on Chronic HFpEF - Some volume excess during the hospitalization.  Volume status seems improved at this time.  Holding diuretics.  Can resume at discharge  Afib -on eliquis .  Diltiazem  on hold  Ileus -per primary service.  Much improved  Elevated LFTs -RUQ u/s with GB sludge and small stones but no cholecystitis, +fatty liver, possible hemangioma - Defer to primary service and outpatient providers  Hyponatremia - Secondary to AKI with some free water  retention.  Mild and slightly improved    Subjective:   Patient continues to feel fairly well.  Denies significant nausea or vomiting.  Creatinine continues to improve slowly.   Objective:   BP (!) 107/56 (BP Location: Left Wrist)   Pulse (!) 58   Temp (!) 97.5 F (36.4 C)   Resp 18   Ht 4' 8 (1.422 m)   Wt 102 kg   SpO2 94%   BMI 50.41 kg/m   Intake/Output Summary (Last 24 hours) at 06/25/2024 0920 Last data filed at 06/25/2024 9081 Gross per 24 hour  Intake 834.19 ml  Output 850 ml   Net -15.81 ml   Weight change: -2 kg  Physical Exam: Gen:NAD, lying in bed CVS: Normal rate, no rub Resp: Bilateral chest rise with no increased work of breathing Abd: obese, soft, slightly distended Ext: nonpitting edema b/l Les Neuro: awake, alert, conversant  Imaging: No results found.   Labs: BMET Recent Labs  Lab 06/19/24 0127 06/20/24 0143 06/21/24 0524 06/22/24 0547 06/23/24 0225 06/24/24 0740 06/25/24 0209  NA 133* 131* 131* 130* 131* 131* 132*  K 3.8 4.7 4.3 4.3 3.7 4.4 3.8  CL 101 100 99 99 97* 98 98  CO2 19* 18* 19* 19* 23 21* 22  GLUCOSE 137* 108* 105* 111* 132* 128* 112*  BUN 52* 53* 55* 58* 62* 65* 69*  CREATININE 3.62* 3.55* 3.43* 3.47* 3.58* 3.39* 3.20*  CALCIUM  9.0 9.0 9.0 8.5* 8.3* 8.6* 8.4*  PHOS  --   --   --   --   --  4.1  --    CBC Recent Labs  Lab 06/19/24 0127 06/20/24 0143 06/23/24 0225 06/25/24 0209  WBC 17.7* 15.4* 21.3* 18.9*  NEUTROABS  --   --   --  15.7*  HGB 11.8* 11.7* 11.8* 11.2*  HCT 35.9* 37.3 36.9 34.4*  MCV 93.7 94.9 91.1 90.8  PLT 230 221 236 248    Medications:     apixaban   5 mg Oral BID   brimonidine   1 drop Right Eye BID   And   timolol   1 drop Right Eye BID   Chlorhexidine  Gluconate Cloth  6 each  Topical Daily   FLUoxetine   40 mg Oral Daily   Gerhardt's butt cream   Topical TID   levothyroxine   112 mcg Oral Q0600   midodrine   5 mg Oral TID WC   polyethylene glycol  17 g Oral BID   rosuvastatin   40 mg Oral q AM   sodium chloride  flush  3 mL Intravenous Q12H   cyanocobalamin   100 mcg Oral q AM      "

## 2024-06-25 NOTE — Progress Notes (Signed)
 Physical Therapy Treatment Patient Details Name: Lauren Clark MRN: [id number] DOB: 02/07/1956 Today's Date: 06/25/2024   History of Present Illness 68 yo F adm 12/13 UTI with burning. Pt with AKI, L pyelonephrtis. PMH anemia, anxiety, aortic dissection COPD, arthritis, depression, glaucoma, blind L eye, limited vision r eye, obese, PVD, gastric sleeve, R THA, HTN, HLD, CKD III,CAD, afib on eliquis , HFpEF, aortic aneurysm.    PT Comments  Pt received in supine and agreeable to session. Pt able to tolerate increased gait distance this session, but continues to demonstrate effortful gait. Pt able to complete pericare and hand hygiene without assist. Pt reports feeling comfortable with O2 management and mobility at home. Pt would benefit from gait trial with rollator prior to return home due to decreased UE support. Pt continues to benefit from PT services to progress toward functional mobility goals.    If plan is discharge home, recommend the following: Assist for transportation;Assistance with cooking/housework   Can travel by private vehicle        Equipment Recommendations  None recommended by PT    Recommendations for Other Services       Precautions / Restrictions Precautions Precautions: Fall;Other (comment) Recall of Precautions/Restrictions: Intact Precaution/Restrictions Comments: watch O2 sats Restrictions Weight Bearing Restrictions Per Provider Order: No     Mobility  Bed Mobility Overal bed mobility: Needs Assistance Bed Mobility: Supine to Sit     Supine to sit: Supervision, HOB elevated     General bed mobility comments: increased time    Transfers Overall transfer level: Needs assistance Equipment used: Rolling walker (2 wheels) Transfers: Sit to/from Stand Sit to Stand: Supervision           General transfer comment: From EOB and toilet with supervision for safety    Ambulation/Gait Ambulation/Gait assistance: Contact guard assist Gait Distance  (Feet): 50 Feet (+10) Assistive device: Rolling walker (2 wheels) Gait Pattern/deviations: Step-through pattern, Decreased stride length, Trunk flexed, Trendelenburg Gait velocity: decreased     General Gait Details: slow, effortful gait with heavy reliance on BUE support. BLE weakness but no buckling noted   Stairs             Wheelchair Mobility     Tilt Bed    Modified Rankin (Stroke Patients Only)       Balance Overall balance assessment: Needs assistance Sitting-balance support: Bilateral upper extremity supported, Feet supported Sitting balance-Leahy Scale: Good     Standing balance support: Bilateral upper extremity supported, During functional activity, Reliant on assistive device for balance Standing balance-Leahy Scale: Poor Standing balance comment: reliant on UE support                            Communication Communication Communication: No apparent difficulties  Cognition Arousal: Alert Behavior During Therapy: WFL for tasks assessed/performed   PT - Cognitive impairments: No apparent impairments                         Following commands: Intact      Cueing Cueing Techniques: Verbal cues  Exercises      General Comments General comments (skin integrity, edema, etc.): SpO2 stable on 2L      Pertinent Vitals/Pain Pain Assessment Pain Assessment: No/denies pain     PT Goals (current goals can now be found in the care plan section) Acute Rehab PT Goals Patient Stated Goal: to return home. PT Goal Formulation: With patient  Time For Goal Achievement: 07/01/24 Progress towards PT goals: Progressing toward goals    Frequency    Min 2X/week       AM-PAC PT 6 Clicks Mobility   Outcome Measure  Help needed turning from your back to your side while in a flat bed without using bedrails?: A Little Help needed moving from lying on your back to sitting on the side of a flat bed without using bedrails?: A  Little Help needed moving to and from a bed to a chair (including a wheelchair)?: A Little Help needed standing up from a chair using your arms (e.g., wheelchair or bedside chair)?: A Little Help needed to walk in hospital room?: A Little Help needed climbing 3-5 steps with a railing? : A Lot 6 Click Score: 17    End of Session Equipment Utilized During Treatment: Gait belt;Oxygen Activity Tolerance: Patient tolerated treatment well Patient left: with call bell/phone within reach;in chair Nurse Communication: Mobility status PT Visit Diagnosis: Unsteadiness on feet (R26.81);Other abnormalities of gait and mobility (R26.89);Muscle weakness (generalized) (M62.81)     Time: 1129-1200 PT Time Calculation (min) (ACUTE ONLY): 31 min  Charges:    $Gait Training: 8-22 mins $Therapeutic Activity: 8-22 mins PT General Charges $$ ACUTE PT VISIT: 1 Visit                    Darryle George, PTA Acute Rehabilitation Services Secure Chat Preferred  [phone]    Darryle George 06/25/2024, 1:01 PM

## 2024-06-25 NOTE — Plan of Care (Signed)
" °  Problem: Education: Goal: Knowledge of General Education information will improve Description: Including pain rating scale, medication(s)/side effects and non-pharmacologic comfort measures Outcome: Progressing   Problem: Clinical Measurements: Goal: Diagnostic test results will improve Outcome: Progressing Goal: Respiratory complications will improve Outcome: Progressing   Problem: Activity: Goal: Risk for activity intolerance will decrease Outcome: Progressing   Problem: Health Behavior/Discharge Planning: Goal: Ability to manage health-related needs will improve Outcome: Not Progressing   Problem: Nutrition: Goal: Adequate nutrition will be maintained Outcome: Not Progressing   "

## 2024-06-25 NOTE — Plan of Care (Signed)

## 2024-06-25 NOTE — Progress Notes (Signed)
 " PROGRESS NOTE    Lauren Clark  FMW:[id number] DOB: [birth year] DOA: 06/15/2024 PCP: Lauren Leni Edyth DELENA, MD   Brief Narrative:  68 y.o. female with medical history significant for hypertension, hyperlipidemia, hypothyroidism, CKD 3A, COPD, chronic hypoxic respiratory failure, CAD, atrial fibrillation on Eliquis , chronic HFpEF, and ascending aortic aneurysm followed by cardiothoracic surgery who presents with dysuria and left flank pain.   Patient reports that she first developed dysuria and discomfort in the left flank approximately 2 weeks ago.  Symptoms have persisted and she has also developed nausea and loss of appetite.  She has had chills intermittently with this.  She denies any recent chest pain, shortness of breath, or significant cough.   ED Course: Upon arrival to the ED, patient is found to be afebrile and saturating well on room air with normal RR, normal HR, and stable BP.  Labs are most notable for creatinine 2.56 and WBC 21,600.  UA notable for pyuria and bacteriuria with positive nitrites.  Renal ultrasound is notable for chronic partially atrophied right kidney but no acute findings.  Assessment & Plan:   Principal Problem:   Acute renal failure superimposed on stage 3a chronic kidney disease (HCC) Active Problems:   Atrial fibrillation, chronic (HCC)   Essential hypertension   Hypothyroidism   Chronic heart failure with preserved ejection fraction (HFpEF) (HCC)   Pyelonephritis of left kidney  1. AKI superimposed on CKD 3A  - SCr is 2.56 on admission, up from baseline closer to 1.2 in the setting of pyelonephritis and recent anorexia. Atrophic R kidney with concerns of L pyelo  -Cr peaked to 3.86 on 12/15, from where it improved to 3.4, but then went up to 3.6 , patient's diuretics were held 06/23/2024, creatinine has started to improve and down to 3.2 today, patient received 2 doses of IV albumin  yesterday.  I will give her 2 more doses today.  Nephrology following.    2. Left pyelonephritis  Completed 7 days of Rocephin  on 06/22/2024.   3. Atrial fibrillation  - Continue Eliquis  and diltiazem , controlled   4.  Acute on chronic hypoxic respiratory failure secondary to acute on Chronic HFpEF  - Uses 2 L of oxygen at baseline however required 10L [place] o2 on 12/15 with increased work of breathing, now back to baseline, Jardiance on hold for now.   5. Hypothyroidism  - Continue Synthroid     7. Mood disorder  - Continue Prozac      8. Fall - unwitnessed -Ordered and reviewed head CT and B hip xrays. Studies neg for fracture of ICH. -consulted PT/OT with recs noted for HHPT/OT   9. Ileus Almost resolved, passing flatus.  Tolerating regular diet.   10. Mildly elevated LFT -RUQ US  with findings of sludge and small stones, no evidence of cholecystitis -Pt denies localized RUQ pains. Tolerating PO - LFTs have been stable.  Monitor intermittently.  DVT prophylaxis: Eliquis    Code Status: Limited: Do not attempt resuscitation (DNR) -DNR-LIMITED -Do Not Intubate/DNI   Family Communication:  None present at bedside.  Plan of care discussed with patient in length and he/she verbalized understanding and agreed with it.  Status is: Inpatient Remains inpatient appropriate because: Elevated creatinine   Estimated body mass index is 50.41 kg/m as calculated from the following:   Height as of this encounter: 4' 8 (1.422 m).   Weight as of this encounter: 102 kg.    Nutritional Assessment: Body mass index is 50.41 kg/m.[name] Seen by dietician.  I  agree with the assessment and plan as outlined below: Nutrition Status:        . Skin Assessment: I have examined the patient's skin and I agree with the wound assessment as performed by the wound care RN as outlined below:    Consultants:  Nephrology  Procedures:  None  Antimicrobials:  Anti-infectives (From admission, onward)    Start     Dose/Rate Route Frequency Ordered Stop   06/15/24 2200   cefTRIAXone  (ROCEPHIN ) 1 g in sodium chloride  0.9 % 100 mL IVPB        1 g 200 mL/hr over 30 Minutes Intravenous Every 24 hours 06/15/24 0518 06/21/24 2123   06/15/24 0215  cefTRIAXone  (ROCEPHIN ) 1 g in sodium chloride  0.9 % 100 mL IVPB        1 g 200 mL/hr over 30 Minutes Intravenous  Once 06/15/24 0203 06/15/24 9687         Subjective: Patient seen and examined.  She has no complaints.  Discussed with nephrology, they have cleared her and they feel comfortable with her going home despite of elevated creatinine.  However due to the fact that patient lives alone, she does not feel comfortable going home.  And I agree with that.  I would keep her overnight, continue albumin  as above, repeat labs in the morning.  It was discussed with her that if we see a continuous trend of improving creatinine by tomorrow, we will discharge her tomorrow.  Objective: Vitals:   06/24/24 1916 06/24/24 2354 06/25/24 0334 06/25/24 0603  BP: (!) 114/55 133/60 (!) 113/51   Pulse: (!) 58 (!) 54 (!) 58   Resp: 18 17 18    Temp: 98.3 F (36.8 C) 97.8 F (36.6 C) 98.6 F (37 C)   TempSrc: Oral Oral Oral   SpO2: 93% 96% 94%   Weight:    102 kg  Height:        Intake/Output Summary (Last 24 hours) at 06/25/2024 0823 Last data filed at 06/25/2024 0603 Gross per 24 hour  Intake 884.19 ml  Output 700 ml  Net 184.19 ml   Filed Weights   06/23/24 0500 06/24/24 0500 06/25/24 0603  Weight: 104 kg 104 kg 102 kg    Examination:  General exam: Appears calm and comfortable  Respiratory system: Clear to auscultation. Respiratory effort normal. Cardiovascular system: S1 & S2 heard, RRR. No JVD, murmurs, rubs, gallops or clicks.  Nonpitting as well as +2 pitting edema bilateral lower extremity Gastrointestinal system: Abdomen is nondistended, soft and nontender. No organomegaly or masses felt. Normal bowel sounds heard. Central nervous system: Alert and oriented. No focal neurological deficits. Extremities:  Symmetric 5 x 5 power. Skin: No rashes, lesions or ulcers.  Psychiatry: Judgement and insight appear normal. Mood & affect appropriate.    Data Reviewed: I have personally reviewed following labs and imaging studies  CBC: Recent Labs  Lab 06/19/24 0127 06/20/24 0143 06/23/24 0225 06/25/24 0209  WBC 17.7* 15.4* 21.3* 18.9*  NEUTROABS  --   --   --  15.7*  HGB 11.8* 11.7* 11.8* 11.2*  HCT 35.9* 37.3 36.9 34.4*  MCV 93.7 94.9 91.1 90.8  PLT 230 221 236 248   Basic Metabolic Panel: Recent Labs  Lab 06/21/24 0524 06/22/24 0547 06/23/24 0225 06/24/24 0740 06/25/24 0209  NA 131* 130* 131* 131* 132*  K 4.3 4.3 3.7 4.4 3.8  CL 99 99 97* 98 98  CO2 19* 19* 23 21* 22  GLUCOSE 105* 111* 132* 128* 112*  BUN 55* 58* 62* 65* 69*  CREATININE 3.43* 3.47* 3.58* 3.39* 3.20*  CALCIUM  9.0 8.5* 8.3* 8.6* 8.4*  MG  --   --  2.4  --   --   PHOS  --   --   --  4.1  --    GFR: Estimated Creatinine Clearance: 16.4 mL/min (A) (by C-G formula based on SCr of 3.2 mg/dL (H)). Liver Function Tests: Recent Labs  Lab 06/20/24 0143 06/21/24 0524 06/22/24 0547 06/23/24 0225 06/24/24 0740 06/25/24 0209  AST 67* 81* 88* 90*  --  89*  ALT 58* 73* 77* 87*  --  90*  ALKPHOS 128* 135* 141* 149*  --  114  BILITOT 0.3 0.3 0.3 0.4  --  0.4  PROT 6.1* 6.2* 5.8* 5.8*  --  6.2*  ALBUMIN  2.2* 2.6* 2.1* 2.3* 2.5* 2.8*   No results for input(s): LIPASE, AMYLASE in the last 168 hours. No results for input(s): AMMONIA in the last 168 hours. Coagulation Profile: No results for input(s): INR, PROTIME in the last 168 hours. Cardiac Enzymes: No results for input(s): CKTOTAL, CKMB, CKMBINDEX, TROPONINI in the last 168 hours. BNP (last 3 results) No results for input(s): PROBNP in the last 8760 hours. HbA1C: No results for input(s): HGBA1C in the last 72 hours. CBG: No results for input(s): GLUCAP in the last 168 hours. Lipid Profile: No results for input(s): CHOL, HDL,  LDLCALC, TRIG, CHOLHDL, LDLDIRECT in the last 72 hours. Thyroid  Function Tests: No results for input(s): TSH, T4TOTAL, FREET4, T3FREE, THYROIDAB in the last 72 hours. Anemia Panel: No results for input(s): VITAMINB12, FOLATE, FERRITIN, TIBC, IRON, RETICCTPCT in the last 72 hours. Sepsis Labs: No results for input(s): PROCALCITON, LATICACIDVEN in the last 168 hours.   No results found for this or any previous visit (from the past 240 hours).    Radiology Studies: No results found.  Scheduled Meds:  apixaban   5 mg Oral BID   brimonidine   1 drop Right Eye BID   And   timolol   1 drop Right Eye BID   Chlorhexidine  Gluconate Cloth  6 each Topical Daily   FLUoxetine   40 mg Oral Daily   Gerhardt's butt cream   Topical TID   levothyroxine   112 mcg Oral Q0600   midodrine   5 mg Oral TID WC   polyethylene glycol  17 g Oral BID   rosuvastatin   40 mg Oral q AM   sodium chloride  flush  3 mL Intravenous Q12H   Suvorexant   1 tablet Oral QHS   cyanocobalamin   100 mcg Oral q AM   Continuous Infusions:  albumin  human       LOS: 10 days   Fredia Skeeter, MD [facility]  06/25/2024, 8:23 AM   *Please note that this is a verbal dictation therefore any spelling or grammatical errors are due to the Dragon [employer] One system interpretation.  Please page via Amion and do not message via secure chat for urgent patient care matters. Secure chat can be used for non urgent patient care matters.  How to contact the [facility] Attending or Consulting provider 7A - 7P or covering provider during after hours 7P -7A, for this patient?  Check the care team in [facility] and look for a) attending/consulting [facility] provider listed and b) the [facility] team listed. Page or secure chat 7A-7P. Log into [email or web address] and use [place]'s universal password to access. If you do not have the password, please contact the hospital operator. Locate the [facility] provider you  are looking for under [facility] and page to a number that you can be directly reached. If you still have difficulty reaching the provider, please page the [facility] (Director on Call) for the Hospitalists listed on amion for assistance.  "

## 2024-06-26 ENCOUNTER — Other Ambulatory Visit: Payer: Self-pay

## 2024-06-26 ENCOUNTER — Emergency Department (HOSPITAL_COMMUNITY)

## 2024-06-26 ENCOUNTER — Inpatient Hospital Stay (HOSPITAL_COMMUNITY)
Admission: EM | Admit: 2024-06-26 | Discharge: 2024-08-04 | DRG: 853 | Disposition: E | Attending: Internal Medicine | Admitting: Internal Medicine

## 2024-06-26 DIAGNOSIS — L89892 Pressure ulcer of other site, stage 2: Secondary | ICD-10-CM | POA: Diagnosis present

## 2024-06-26 DIAGNOSIS — R6521 Severe sepsis with septic shock: Secondary | ICD-10-CM | POA: Diagnosis present

## 2024-06-26 DIAGNOSIS — N136 Pyonephrosis: Secondary | ICD-10-CM | POA: Diagnosis present

## 2024-06-26 DIAGNOSIS — N390 Urinary tract infection, site not specified: Secondary | ICD-10-CM

## 2024-06-26 DIAGNOSIS — E872 Acidosis, unspecified: Secondary | ICD-10-CM | POA: Diagnosis present

## 2024-06-26 DIAGNOSIS — Z90722 Acquired absence of ovaries, bilateral: Secondary | ICD-10-CM

## 2024-06-26 DIAGNOSIS — Z90710 Acquired absence of both cervix and uterus: Secondary | ICD-10-CM

## 2024-06-26 DIAGNOSIS — I2510 Atherosclerotic heart disease of native coronary artery without angina pectoris: Secondary | ICD-10-CM | POA: Diagnosis present

## 2024-06-26 DIAGNOSIS — M4316 Spondylolisthesis, lumbar region: Secondary | ICD-10-CM | POA: Diagnosis present

## 2024-06-26 DIAGNOSIS — H543 Unqualified visual loss, both eyes: Secondary | ICD-10-CM | POA: Diagnosis present

## 2024-06-26 DIAGNOSIS — I5020 Unspecified systolic (congestive) heart failure: Secondary | ICD-10-CM

## 2024-06-26 DIAGNOSIS — N2889 Other specified disorders of kidney and ureter: Secondary | ICD-10-CM | POA: Diagnosis present

## 2024-06-26 DIAGNOSIS — Z79899 Other long term (current) drug therapy: Secondary | ICD-10-CM

## 2024-06-26 DIAGNOSIS — F32A Depression, unspecified: Secondary | ICD-10-CM | POA: Diagnosis present

## 2024-06-26 DIAGNOSIS — F419 Anxiety disorder, unspecified: Secondary | ICD-10-CM | POA: Diagnosis present

## 2024-06-26 DIAGNOSIS — R7401 Elevation of levels of liver transaminase levels: Secondary | ICD-10-CM

## 2024-06-26 DIAGNOSIS — Z961 Presence of intraocular lens: Secondary | ICD-10-CM | POA: Diagnosis present

## 2024-06-26 DIAGNOSIS — Z9842 Cataract extraction status, left eye: Secondary | ICD-10-CM

## 2024-06-26 DIAGNOSIS — I130 Hypertensive heart and chronic kidney disease with heart failure and stage 1 through stage 4 chronic kidney disease, or unspecified chronic kidney disease: Secondary | ICD-10-CM | POA: Diagnosis present

## 2024-06-26 DIAGNOSIS — Z6837 Body mass index (BMI) 37.0-37.9, adult: Secondary | ICD-10-CM

## 2024-06-26 DIAGNOSIS — Z5982 Transportation insecurity: Secondary | ICD-10-CM

## 2024-06-26 DIAGNOSIS — R9431 Abnormal electrocardiogram [ECG] [EKG]: Secondary | ICD-10-CM | POA: Diagnosis present

## 2024-06-26 DIAGNOSIS — G479 Sleep disorder, unspecified: Secondary | ICD-10-CM | POA: Diagnosis not present

## 2024-06-26 DIAGNOSIS — N179 Acute kidney failure, unspecified: Secondary | ICD-10-CM | POA: Diagnosis present

## 2024-06-26 DIAGNOSIS — K219 Gastro-esophageal reflux disease without esophagitis: Secondary | ICD-10-CM | POA: Diagnosis present

## 2024-06-26 DIAGNOSIS — G9341 Metabolic encephalopathy: Secondary | ICD-10-CM | POA: Diagnosis present

## 2024-06-26 DIAGNOSIS — J441 Chronic obstructive pulmonary disease with (acute) exacerbation: Secondary | ICD-10-CM | POA: Diagnosis present

## 2024-06-26 DIAGNOSIS — L89812 Pressure ulcer of head, stage 2: Secondary | ICD-10-CM | POA: Diagnosis present

## 2024-06-26 DIAGNOSIS — Z8249 Family history of ischemic heart disease and other diseases of the circulatory system: Secondary | ICD-10-CM

## 2024-06-26 DIAGNOSIS — E1122 Type 2 diabetes mellitus with diabetic chronic kidney disease: Secondary | ICD-10-CM | POA: Diagnosis present

## 2024-06-26 DIAGNOSIS — Z7984 Long term (current) use of oral hypoglycemic drugs: Secondary | ICD-10-CM

## 2024-06-26 DIAGNOSIS — E785 Hyperlipidemia, unspecified: Secondary | ICD-10-CM | POA: Diagnosis present

## 2024-06-26 DIAGNOSIS — Z7901 Long term (current) use of anticoagulants: Secondary | ICD-10-CM

## 2024-06-26 DIAGNOSIS — I2489 Other forms of acute ischemic heart disease: Secondary | ICD-10-CM | POA: Diagnosis present

## 2024-06-26 DIAGNOSIS — R001 Bradycardia, unspecified: Secondary | ICD-10-CM | POA: Diagnosis present

## 2024-06-26 DIAGNOSIS — Z86718 Personal history of other venous thrombosis and embolism: Secondary | ICD-10-CM

## 2024-06-26 DIAGNOSIS — Z66 Do not resuscitate: Secondary | ICD-10-CM | POA: Diagnosis present

## 2024-06-26 DIAGNOSIS — I5043 Acute on chronic combined systolic (congestive) and diastolic (congestive) heart failure: Secondary | ICD-10-CM | POA: Diagnosis present

## 2024-06-26 DIAGNOSIS — B3749 Other urogenital candidiasis: Secondary | ICD-10-CM | POA: Diagnosis present

## 2024-06-26 DIAGNOSIS — I4819 Other persistent atrial fibrillation: Secondary | ICD-10-CM | POA: Diagnosis present

## 2024-06-26 DIAGNOSIS — Z888 Allergy status to other drugs, medicaments and biological substances status: Secondary | ICD-10-CM

## 2024-06-26 DIAGNOSIS — R54 Age-related physical debility: Secondary | ICD-10-CM | POA: Diagnosis present

## 2024-06-26 DIAGNOSIS — Z9079 Acquired absence of other genital organ(s): Secondary | ICD-10-CM

## 2024-06-26 DIAGNOSIS — Z87440 Personal history of urinary (tract) infections: Secondary | ICD-10-CM

## 2024-06-26 DIAGNOSIS — J984 Other disorders of lung: Secondary | ICD-10-CM | POA: Diagnosis present

## 2024-06-26 DIAGNOSIS — I5021 Acute systolic (congestive) heart failure: Secondary | ICD-10-CM

## 2024-06-26 DIAGNOSIS — N200 Calculus of kidney: Secondary | ICD-10-CM | POA: Diagnosis present

## 2024-06-26 DIAGNOSIS — E66813 Obesity, class 3: Secondary | ICD-10-CM | POA: Diagnosis present

## 2024-06-26 DIAGNOSIS — L8990 Pressure ulcer of unspecified site, unspecified stage: Secondary | ICD-10-CM | POA: Insufficient documentation

## 2024-06-26 DIAGNOSIS — J9621 Acute and chronic respiratory failure with hypoxia: Secondary | ICD-10-CM | POA: Diagnosis present

## 2024-06-26 DIAGNOSIS — Z818 Family history of other mental and behavioral disorders: Secondary | ICD-10-CM

## 2024-06-26 DIAGNOSIS — K8030 Calculus of bile duct with cholangitis, unspecified, without obstruction: Secondary | ICD-10-CM | POA: Diagnosis present

## 2024-06-26 DIAGNOSIS — E871 Hypo-osmolality and hyponatremia: Secondary | ICD-10-CM | POA: Diagnosis present

## 2024-06-26 DIAGNOSIS — K7200 Acute and subacute hepatic failure without coma: Secondary | ICD-10-CM | POA: Diagnosis present

## 2024-06-26 DIAGNOSIS — J1008 Influenza due to other identified influenza virus with other specified pneumonia: Secondary | ICD-10-CM | POA: Diagnosis present

## 2024-06-26 DIAGNOSIS — B952 Enterococcus as the cause of diseases classified elsewhere: Secondary | ICD-10-CM | POA: Diagnosis present

## 2024-06-26 DIAGNOSIS — Z96652 Presence of left artificial knee joint: Secondary | ICD-10-CM | POA: Diagnosis present

## 2024-06-26 DIAGNOSIS — J159 Unspecified bacterial pneumonia: Secondary | ICD-10-CM | POA: Diagnosis present

## 2024-06-26 DIAGNOSIS — Z9981 Dependence on supplemental oxygen: Secondary | ICD-10-CM

## 2024-06-26 DIAGNOSIS — N12 Tubulo-interstitial nephritis, not specified as acute or chronic: Secondary | ICD-10-CM | POA: Diagnosis present

## 2024-06-26 DIAGNOSIS — Z515 Encounter for palliative care: Secondary | ICD-10-CM

## 2024-06-26 DIAGNOSIS — Z9841 Cataract extraction status, right eye: Secondary | ICD-10-CM

## 2024-06-26 DIAGNOSIS — I5181 Takotsubo syndrome: Secondary | ICD-10-CM | POA: Diagnosis present

## 2024-06-26 DIAGNOSIS — A419 Sepsis, unspecified organism: Principal | ICD-10-CM | POA: Diagnosis present

## 2024-06-26 DIAGNOSIS — Z9884 Bariatric surgery status: Secondary | ICD-10-CM

## 2024-06-26 DIAGNOSIS — Z96641 Presence of right artificial hip joint: Secondary | ICD-10-CM | POA: Diagnosis present

## 2024-06-26 DIAGNOSIS — Z79890 Hormone replacement therapy (postmenopausal): Secondary | ICD-10-CM

## 2024-06-26 DIAGNOSIS — Z5941 Food insecurity: Secondary | ICD-10-CM

## 2024-06-26 DIAGNOSIS — H409 Unspecified glaucoma: Secondary | ICD-10-CM | POA: Diagnosis present

## 2024-06-26 DIAGNOSIS — Z5948 Other specified lack of adequate food: Secondary | ICD-10-CM

## 2024-06-26 DIAGNOSIS — Z8614 Personal history of Methicillin resistant Staphylococcus aureus infection: Secondary | ICD-10-CM

## 2024-06-26 DIAGNOSIS — J440 Chronic obstructive pulmonary disease with acute lower respiratory infection: Secondary | ICD-10-CM | POA: Diagnosis present

## 2024-06-26 DIAGNOSIS — K8070 Calculus of gallbladder and bile duct without cholecystitis without obstruction: Secondary | ICD-10-CM | POA: Diagnosis present

## 2024-06-26 DIAGNOSIS — I7121 Aneurysm of the ascending aorta, without rupture: Secondary | ICD-10-CM | POA: Diagnosis present

## 2024-06-26 DIAGNOSIS — J101 Influenza due to other identified influenza virus with other respiratory manifestations: Secondary | ICD-10-CM

## 2024-06-26 DIAGNOSIS — D689 Coagulation defect, unspecified: Secondary | ICD-10-CM | POA: Diagnosis present

## 2024-06-26 DIAGNOSIS — N1832 Chronic kidney disease, stage 3b: Secondary | ICD-10-CM | POA: Diagnosis present

## 2024-06-26 DIAGNOSIS — N210 Calculus in bladder: Secondary | ICD-10-CM | POA: Diagnosis present

## 2024-06-26 DIAGNOSIS — Y92009 Unspecified place in unspecified non-institutional (private) residence as the place of occurrence of the external cause: Secondary | ICD-10-CM

## 2024-06-26 DIAGNOSIS — W1830XA Fall on same level, unspecified, initial encounter: Secondary | ICD-10-CM | POA: Diagnosis present

## 2024-06-26 DIAGNOSIS — R7989 Other specified abnormal findings of blood chemistry: Secondary | ICD-10-CM

## 2024-06-26 DIAGNOSIS — I351 Nonrheumatic aortic (valve) insufficiency: Secondary | ICD-10-CM | POA: Diagnosis present

## 2024-06-26 DIAGNOSIS — E1151 Type 2 diabetes mellitus with diabetic peripheral angiopathy without gangrene: Secondary | ICD-10-CM | POA: Diagnosis present

## 2024-06-26 DIAGNOSIS — J9601 Acute respiratory failure with hypoxia: Principal | ICD-10-CM

## 2024-06-26 DIAGNOSIS — N1831 Chronic kidney disease, stage 3a: Secondary | ICD-10-CM | POA: Diagnosis not present

## 2024-06-26 DIAGNOSIS — E039 Hypothyroidism, unspecified: Secondary | ICD-10-CM | POA: Diagnosis present

## 2024-06-26 LAB — COMPREHENSIVE METABOLIC PANEL WITH GFR
ALT: 95 U/L — ABNORMAL HIGH (ref 0–44)
ALT: 276 U/L — ABNORMAL HIGH (ref 0–44)
AST: 101 U/L — ABNORMAL HIGH (ref 15–41)
AST: 586 U/L — ABNORMAL HIGH (ref 15–41)
Albumin: 3.3 g/dL — ABNORMAL LOW (ref 3.5–5.0)
Albumin: 3.4 g/dL — ABNORMAL LOW (ref 3.5–5.0)
Alkaline Phosphatase: 108 U/L (ref 38–126)
Alkaline Phosphatase: 203 U/L — ABNORMAL HIGH (ref 38–126)
Anion gap: 10 (ref 5–15)
Anion gap: 16 — ABNORMAL HIGH (ref 5–15)
BUN: 59 mg/dL — ABNORMAL HIGH (ref 8–23)
BUN: 56 mg/dL — ABNORMAL HIGH (ref 8–23)
CO2: 24 mmol/L (ref 22–32)
CO2: 17 mmol/L — ABNORMAL LOW (ref 22–32)
Calcium: 8.6 mg/dL — ABNORMAL LOW (ref 8.9–10.3)
Calcium: 8.8 mg/dL — ABNORMAL LOW (ref 8.9–10.3)
Chloride: 99 mmol/L (ref 98–111)
Chloride: 99 mmol/L (ref 98–111)
Creatinine, Ser: 2.97 mg/dL — ABNORMAL HIGH (ref 0.44–1.00)
Creatinine, Ser: 3.08 mg/dL — ABNORMAL HIGH (ref 0.44–1.00)
GFR, Estimated: 16 mL/min — ABNORMAL LOW
GFR, Estimated: 16 mL/min — ABNORMAL LOW
Glucose, Bld: 96 mg/dL (ref 70–99)
Glucose, Bld: 160 mg/dL — ABNORMAL HIGH (ref 70–99)
Potassium: 4.1 mmol/L (ref 3.5–5.1)
Potassium: 4.7 mmol/L (ref 3.5–5.1)
Sodium: 133 mmol/L — ABNORMAL LOW (ref 135–145)
Sodium: 131 mmol/L — ABNORMAL LOW (ref 135–145)
Total Bilirubin: 0.6 mg/dL (ref 0.0–1.2)
Total Bilirubin: 1.2 mg/dL (ref 0.0–1.2)
Total Protein: 6.3 g/dL — ABNORMAL LOW (ref 6.5–8.1)
Total Protein: 7 g/dL (ref 6.5–8.1)

## 2024-06-26 LAB — I-STAT CHEM 8, ED
BUN: 65 mg/dL — ABNORMAL HIGH (ref 8–23)
Calcium, Ion: 1.07 mmol/L — ABNORMAL LOW (ref 1.15–1.40)
Chloride: 101 mmol/L (ref 98–111)
Creatinine, Ser: 3.2 mg/dL — ABNORMAL HIGH (ref 0.44–1.00)
Glucose, Bld: 160 mg/dL — ABNORMAL HIGH (ref 70–99)
HCT: 42 % (ref 36.0–46.0)
Hemoglobin: 14.3 g/dL (ref 12.0–15.0)
Potassium: 4.6 mmol/L (ref 3.5–5.1)
Sodium: 133 mmol/L — ABNORMAL LOW (ref 135–145)
TCO2: 19 mmol/L — ABNORMAL LOW (ref 22–32)

## 2024-06-26 LAB — PROTIME-INR
INR: 2.4 — ABNORMAL HIGH (ref 0.8–1.2)
Prothrombin Time: 27.4 s — ABNORMAL HIGH (ref 11.4–15.2)

## 2024-06-26 LAB — GLUCOSE, CAPILLARY: Glucose-Capillary: 97 mg/dL (ref 70–99)

## 2024-06-26 LAB — I-STAT CG4 LACTIC ACID, ED: Lactic Acid, Venous: 4.1 mmol/L (ref 0.5–1.9)

## 2024-06-26 MED ORDER — METHYLPREDNISOLONE SODIUM SUCC 125 MG IJ SOLR
125.0000 mg | Freq: Once | INTRAMUSCULAR | Status: AC
  Administered 2024-06-27: 125 mg via INTRAVENOUS
  Filled 2024-06-26: qty 2

## 2024-06-26 MED ORDER — ALBUTEROL SULFATE (2.5 MG/3ML) 0.083% IN NEBU
10.0000 mg | INHALATION_SOLUTION | RESPIRATORY_TRACT | Status: AC
  Administered 2024-06-27: 10 mg via RESPIRATORY_TRACT
  Filled 2024-06-26: qty 12

## 2024-06-26 MED ORDER — MAGNESIUM SULFATE 2 GM/50ML IV SOLN
2.0000 g | Freq: Once | INTRAVENOUS | Status: AC
  Administered 2024-06-27: 2 g via INTRAVENOUS
  Filled 2024-06-26: qty 50

## 2024-06-26 NOTE — Discharge Summary (Signed)
 Physician Discharge Summary  Lauren Clark FMW:[id number] DOB: 04-Oct-1955 DOA: 06/15/2024  PCP: Lauren Leni Edyth DELENA, MD  Admit date: 06/15/2024 Discharge date: 06/26/2024 30 Day Unplanned Readmission Risk Score    Flowsheet Row ED to Hosp-Admission (Current) from 06/15/2024 in [place] [facility] Unit  30 Day Unplanned Readmission Risk Score (%) 23.48 Filed at 06/26/2024 0801    This score is the patient's risk of an unplanned readmission within 30 days of being discharged (0 -100%). The score is based on dignosis, age, lab data, medications, orders, and past utilization.   Low:  0-14.9   Medium: 15-21.9   High: 22-29.9   Extreme: 30 and above          Admitted From: Home Disposition: Home  Recommendations for Outpatient Follow-up:  Follow up with PCP in 1-2 weeks Please obtain BMP/CBC in one week Follow-up with nephrology-they will call to set up appointment. Please follow up with your PCP on the following pending results: Unresulted Labs (From admission, onward)    None         Home Health: Yes Equipment/Devices: None  Discharge Condition: Stable CODE STATUS: DNR Diet recommendation:  Diet Order             Diet renal with fluid restriction Fluid restriction: 1200 mL Fluid; Room service appropriate? Yes; Fluid consistency: Thin  Diet effective now                   Subjective: Patient seen and examined, she has no complaints.  She is excited about going home today.  Brief/Interim Summary: 68 y.o. female with medical history significant for hypertension, hyperlipidemia, hypothyroidism, CKD 3A, COPD, chronic hypoxic respiratory failure, CAD, atrial fibrillation on Eliquis , chronic HFpEF, and ascending aortic aneurysm followed by cardiothoracic surgery who presented with dysuria and left flank pain.   Upon arrival to the ED, she was hemodynamically stable however labs were notable for creatinine 2.56 and WBC 21,600.  UA notable for pyuria and bacteriuria  with positive nitrites.  Renal ultrasound is notable for chronic partially atrophied right kidney but no acute findings.  Details below.   1. AKI superimposed on CKD 3A  - SCr is 2.56 on admission, up from baseline closer to 1.2 in the setting of pyelonephritis and recent anorexia. Atrophic R kidney with concerns of L pyelo  -Cr peaked to 3.86 on 12/15, from where it improved to 3.4, but then went up to 3.6 , patient's diuretics were held 06/23/2024, she received some IV albumin , started to improve and down to 3.2 yesterday, nephrology cleared her for discharge however she was not comfortable going home since she lives alone, we kept her overnight, creatinine further improved to 2.97 today.  Discussed with nephrology, they have cleared her to resume Lasix  as well.  They will follow-up with her as outpatient.  She was placed Foley catheter upon admission, this was discontinued today, patient has voided x 2 after that and bladder scan is 0.   2. Left pyelonephritis  Completed 7 days of Rocephin  on 06/22/2024.   3. Atrial fibrillation  - Continue Eliquis  and diltiazem , controlled   4.  Acute on chronic hypoxic respiratory failure secondary to acute on Chronic HFpEF  - Uses 2 L of oxygen at baseline however required 10L [place] o2 on 12/15 with increased work of breathing, now back to baseline, Jardiance on hold for now.   5. Hypothyroidism  - Continue Synthroid     7. Mood disorder  - Continue  Prozac      8. Fall - unwitnessed -Ordered and reviewed head CT and B hip xrays. Studies neg for fracture of ICH. -consulted PT/OT with recs noted for HHPT/OT   9. Ileus Almost resolved, passing flatus.  Tolerating regular diet.   10. Mildly elevated LFT -RUQ US  with findings of sludge and small stones, no evidence of cholecystitis -Pt denies localized RUQ pains. Tolerating PO - LFTs have been stable.  Follow outpatient with PCP.   Discharge Diagnoses:  Principal Problem:   Acute renal failure  superimposed on stage 3a chronic kidney disease (HCC) Active Problems:   Atrial fibrillation, chronic (HCC)   Essential hypertension   Hypothyroidism   Chronic heart failure with preserved ejection fraction (HFpEF) (HCC)   Pyelonephritis of left kidney    Discharge Instructions   Allergies as of 06/26/2024       Reactions   Crestor  [rosuvastatin  Calcium ] Other (See Comments)   Muscle aches   Lipitor [atorvastatin] Other (See Comments)   Muscle aches   Hctz [hydrochlorothiazide] Other (See Comments)   Pt doesn't remember        Medication List     PAUSE taking these medications    empagliflozin 10 MG Tabs tablet Wait to take this until: June 28, 2024 Commonly known as: JARDIANCE Take 10 mg by mouth daily.   furosemide  40 MG tablet Wait to take this until: June 27, 2024 Commonly known as: LASIX  Take 1 tablet (40 mg total) by mouth daily.       TAKE these medications    Belsomra  10 MG Tabs Generic drug: Suvorexant  Take 1 tablet by mouth at bedtime.   brimonidine -timolol  0.2-0.5 % ophthalmic solution Commonly known as: COMBIGAN  Place 1 drop into the right eye 2 (two) times daily.   CALCIUM  CITRATE PO Take 600 mg by mouth daily.   Eliquis  5 MG Tabs tablet Generic drug: apixaban  TAKE 1 TABLET(5 MG) BY MOUTH TWICE DAILY   FLUoxetine  40 MG capsule Commonly known as: PROZAC  Take 40 mg by mouth daily.   latanoprost  0.005 % ophthalmic solution Commonly known as: XALATAN  Place 1 drop into the right eye at bedtime.   levothyroxine  112 MCG tablet Commonly known as: SYNTHROID  Take 112 mcg by mouth every morning.   magnesium  oxide 400 (240 Mg) MG tablet Commonly known as: MAG-OX Take 400 mg by mouth daily.   MINERIN CREME EX Apply 1 application  topically daily.   NON FORMULARY Take 1 tablet by mouth 2 (two) times daily. Cranberry conascorib acid   rosuvastatin  40 MG tablet Commonly known as: CRESTOR  Take 40 mg by mouth in the morning.    TRIPLE OMEGA-3-6-9 PO Take 1 capsule by mouth daily.   VITAMIN B12 PO Take 1 tablet by mouth in the morning.               Durable Medical Equipment  (From admission, onward)           Start     Ordered   06/17/24 1453  For home use only DME Other see comment  Once       Comments: Patient needs Rollator  Question:  Length of Need  Answer:  Lifetime   06/17/24 1452            Follow-up Information     [facility] Follow up.   Specialty: Home Health Services Why: Someone from [facility] will contact you to arrange start date and time for your physical therapy Contact information: [address] [place] [address] [phone]         Lauren Leni Edyth DELENA, MD Follow up in 1 week(s).   Specialty: Family Medicine Contact information: [address] [place] [address] [place] [address] [phone]                Allergies[1]  Consultations: Nephrology   Procedures/Studies: US  Abdomen Limited RUQ (LIVER/GB) Result Date: 06/20/2024 CLINICAL DATA:  Elevated LFT. EXAM: ULTRASOUND ABDOMEN LIMITED RIGHT UPPER QUADRANT COMPARISON:  None Available. FINDINGS: Gallbladder: There are sludge and stone within the gallbladder. No bowel wall thickening or pericholecystic fluid. Negative sonographic Murphy's sign. Common bile duct: Diameter: 8 mm Liver: There is diffuse increased liver echogenicity most commonly seen in the setting of fatty infiltration. Superimposed inflammation or fibrosis is not excluded. Clinical correlation is recommended. There is a 1.2 x 1.1 x 1.0 cm echogenic lesion in the right lobe of the liver which is suboptimally characterized on this ultrasound but the echogenic features suggestive of a hemangioma. Portal vein is patent on color Doppler imaging with normal direction of blood flow towards the liver. Other: The visualized right kidney appears echogenic and somewhat atrophic. IMPRESSION: 1. Gallbladder sludge  and small stones. No sonographic findings of acute cholecystitis. 2. Fatty liver. Echogenic lesion in the right lobe of the liver favored to represent a hemangioma. 3. Atrophic and echogenic right kidney in keeping with chronic kidney disease. Electronically Signed   By: Vanetta Chou M.D.   On: 06/20/2024 20:33   DG Abd 1 View Result Date: 06/18/2024 EXAM: 1 VIEW XRAY OF THE ABDOMEN 06/18/2024 06:41:41 PM COMPARISON: CT chest abdomen and pelvis 11/18/2022. CLINICAL HISTORY: Ileus (HCC). FINDINGS: BOWEL: Diffuse gaseous distention of the colon with gas-filled nondistended small bowel. Changes are most likely to indicate adynamic ileus although low colonic obstruction could have this appearance. SOFT TISSUES: Ovoid calcification projecting over the left mid abdomen measuring 9 mm. This could represent a renal or ureteral stone or possibly opaque medication. BONES: Postoperative right hip arthroplasty. Degenerative changes in the spine. No acute fracture. LUNGS: Visualized lung bases are clear. IMPRESSION: 1. Diffuse gaseous distention of the colon with gas-filled nondistended small bowel, most consistent with adynamic ileus. Low colonic obstruction could have this appearance. 2. Ovoid calcification projecting over the left mid abdomen measuring 9 mm, possibly representing a renal or ureteral stone or opaque medication. Electronically signed by: Elsie Gravely MD 06/18/2024 06:47 PM EST RP Workstation: [id number]   DG Chest Port 1 View Result Date: 06/17/2024 EXAM: 1 VIEW(S) XRAY OF THE CHEST 06/17/2024 01:18:00 AM COMPARISON: CT chest dated 04/22/2024. CLINICAL HISTORY: Wheezing. FINDINGS: LUNGS AND PLEURA: Mild plate-like scarring in the right mid lung. Increased interstitial markings without frank interstitial edema. No pleural effusion. No pneumothorax. HEART AND MEDIASTINUM: Cardiomegaly. BONES AND SOFT TISSUES: No acute osseous abnormality. IMPRESSION: 1. No acute findings. 2. Cardiomegaly.  Electronically signed by: Pinkie Pebbles MD 06/17/2024 01:32 AM EST RP Workstation: [id number]   DG HIP UNILAT WITH PELVIS 1V RIGHT Result Date: 06/16/2024 CLINICAL DATA:  Fall. EXAM: DG HIP (WITH OR WITHOUT PELVIS) 1V RIGHT; DG HIP (WITH OR WITHOUT PELVIS) 1V*L* COMPARISON:  None Available. FINDINGS: There is no evidence of acute hip fracture or dislocation. Mild degenerative changes are present at the left hip. IMPRESSION: Mild degenerative changes at the left hip with no evidence of acute fracture or dislocation. Electronically Signed   By: Leita Birmingham M.D.   On: 06/16/2024 15:20   DG HIP UNILAT WITH  PELVIS 1V LEFT Result Date: 06/16/2024 CLINICAL DATA:  Fall. EXAM: DG HIP (WITH OR WITHOUT PELVIS) 1V RIGHT; DG HIP (WITH OR WITHOUT PELVIS) 1V*L* COMPARISON:  None Available. FINDINGS: There is no evidence of acute hip fracture or dislocation. Mild degenerative changes are present at the left hip. IMPRESSION: Mild degenerative changes at the left hip with no evidence of acute fracture or dislocation. Electronically Signed   By: Leita Birmingham M.D.   On: 06/16/2024 15:20   CT HEAD WO CONTRAST ( ) Result Date: 06/16/2024 EXAM: CT HEAD WITHOUT 06/16/2024 02:50:24 PM TECHNIQUE: CT of the head was performed without the administration of intravenous contrast. Automated exposure control, iterative reconstruction, and/or weight based adjustment of the mA/kV was utilized to reduce the radiation dose to as low as reasonably achievable. COMPARISON: Head MRI 02/04/2022. CLINICAL HISTORY: Head trauma, minor (Age >= 65y); fall, on eliquis . FINDINGS: BRAIN AND VENTRICLES: There is no evidence of an acute infarct, intracranial hemorrhage, midline shift, hydrocephalus, or extra-axial fluid collection. There is mild cerebral atrophy. Cerebral white matter hypodensities are nonspecific but compatible with mild chronic small vessel ischemic disease. A 1.1 cm calcified extra-axial mass along the anterior falx is  unchanged. Calcified atherosclerosis at the skull base. ORBITS: Right cataract extraction. Left phthisis bulbi. SINUSES AND MASTOIDS: No acute abnormality. SOFT TISSUES AND SKULL: No acute skull fracture. No acute soft tissue abnormality. IMPRESSION: 1. No acute intracranial abnormality. 2. Mild chronic small vessel ischemic disease. 3. Unchanged small anterior falcine meningioma. Electronically signed by: Dasie Hamburg MD 06/16/2024 03:10 PM EST RP Workstation: [id number]   US  Renal Result Date: 06/15/2024 EXAM: US  Retroperitoneum Complete, Renal. 06/15/2024 04:17:30 AM TECHNIQUE: Real-time ultrasonography of the retroperitoneum renal was performed. COMPARISON: CTA chest abdomen and pelvis on 11/18/2022. CLINICAL HISTORY: 68 year old female with flank pain. FINDINGS: FINDINGS: RIGHT KIDNEY/URETER: Right kidney measures 7.4 x 4.5 x 5.3 cm, estimated volume 92 mL. Chronically partially atrophied and demonstrates asymmetric decrease in corticomedullary differentiation (series 1 image 27). No hydronephrosis. No calculus. No mass. LEFT KIDNEY/URETER: Left kidney measures 12.1 x 6.3 x 6.4 cm, estimated volume 255 mL. Normal cortical echogenicity. No hydronephrosis. No calculus. No mass. BLADDER: Diminutive, decompressed bladder. IMPRESSION: 1. Chronic partially atrophied right kidney.  No acute findings. Electronically signed by: Helayne Hurst MD 06/15/2024 04:33 AM EST RP Workstation: [id number]     Discharge Exam: Vitals:   06/26/24 0420 06/26/24 0840  BP:  (!) 162/56  Pulse:  66  Resp:  18  Temp:    SpO2: 93% 94%   Vitals:   06/26/24 0030 06/26/24 0411 06/26/24 0420 06/26/24 0840  BP: (!) 133/53 (!) 130/49  (!) 162/56  Pulse: (!) 59 (!) 59  66  Resp:    18  Temp: 98.7 F (37.1 C) (!) 97.5 F (36.4 C)    TempSrc:  Oral    SpO2: 94% (!) 88% 93% 94%  Weight:      Height:        General: Pt is alert, awake, not in acute distress Cardiovascular: RRR, S1/S2 +, no rubs, no gallops Respiratory:  CTA bilaterally, no wheezing, no rhonchi Abdominal: Soft, NT, ND, bowel sounds + Extremities: Chronic pitting and nonpitting edema, no cyanosis    The results of significant diagnostics from this hospitalization (including imaging, microbiology, ancillary and laboratory) are listed below for reference.     Microbiology: No results found for this or any previous visit (from the past 240 hours).   Labs: BNP (last 3 results) Recent Labs  01/01/24 0851 04/15/24 1032 06/17/24 0135  BNP 311.3* 179.8* 567.8*   Basic Metabolic Panel: Recent Labs  Lab 06/22/24 0547 06/23/24 0225 06/24/24 0740 06/25/24 0209 06/26/24 0325  NA 130* 131* 131* 132* 133*  K 4.3 3.7 4.4 3.8 4.1  CL 99 97* 98 98 99  CO2 19* 23 21* 22 24  GLUCOSE 111* 132* 128* 112* 96  BUN 58* 62* 65* 69* 59*  CREATININE 3.47* 3.58* 3.39* 3.20* 2.97*  CALCIUM  8.5* 8.3* 8.6* 8.4* 8.6*  MG  --  2.4  --   --   --   PHOS  --   --  4.1  --   --    Liver Function Tests: Recent Labs  Lab 06/21/24 0524 06/22/24 0547 06/23/24 0225 06/24/24 0740 06/25/24 0209 06/26/24 0325  AST 81* 88* 90*  --  89* 101*  ALT 73* 77* 87*  --  90* 95*  ALKPHOS 135* 141* 149*  --  114 108  BILITOT 0.3 0.3 0.4  --  0.4 0.6  PROT 6.2* 5.8* 5.8*  --  6.2* 6.3*  ALBUMIN  2.6* 2.1* 2.3* 2.5* 2.8* 3.3*   No results for input(s): LIPASE, AMYLASE in the last 168 hours. No results for input(s): AMMONIA in the last 168 hours. CBC: Recent Labs  Lab 06/20/24 0143 06/23/24 0225 06/25/24 0209  WBC 15.4* 21.3* 18.9*  NEUTROABS  --   --  15.7*  HGB 11.7* 11.8* 11.2*  HCT 37.3 36.9 34.4*  MCV 94.9 91.1 90.8  PLT 221 236 248   Cardiac Enzymes: No results for input(s): CKTOTAL, CKMB, CKMBINDEX, TROPONINI in the last 168 hours. BNP: Invalid input(s): POCBNP CBG: Recent Labs  Lab 06/26/24 0425  GLUCAP 97   D-Dimer No results for input(s): DDIMER in the last 72 hours. Hgb A1c No results for input(s): HGBA1C in the  last 72 hours. Lipid Profile No results for input(s): CHOL, HDL, LDLCALC, TRIG, CHOLHDL, LDLDIRECT in the last 72 hours. Thyroid  function studies No results for input(s): TSH, T4TOTAL, T3FREE, THYROIDAB in the last 72 hours.  Invalid input(s): FREET3 Anemia work up No results for input(s): VITAMINB12, FOLATE, FERRITIN, TIBC, IRON, RETICCTPCT in the last 72 hours. Urinalysis    Component Value Date/Time   COLORURINE AMBER (A) 06/15/2024 0143   APPEARANCEUR CLOUDY (A) 06/15/2024 0143   LABSPEC 1.018 06/15/2024 0143   PHURINE 6.0 06/15/2024 0143   GLUCOSEU >=500 (A) 06/15/2024 0143   HGBUR SMALL (A) 06/15/2024 0143   BILIRUBINUR NEGATIVE 06/15/2024 0143   KETONESUR 5 (A) 06/15/2024 0143   PROTEINUR 100 (A) 06/15/2024 0143   NITRITE POSITIVE (A) 06/15/2024 0143   LEUKOCYTESUR MODERATE (A) 06/15/2024 0143   Sepsis Labs Recent Labs  Lab 06/20/24 0143 06/23/24 0225 06/25/24 0209  WBC 15.4* 21.3* 18.9*   Microbiology No results found for this or any previous visit (from the past 240 hours).  FURTHER DISCHARGE INSTRUCTIONS:   Get Medicines reviewed and adjusted: Please take all your medications with you for your next visit with your Primary MD   Laboratory/radiological data: Please request your Primary MD to go over all hospital tests and procedure/radiological results at the follow up, please ask your Primary MD to get all Hospital records sent to his/her office.   In some cases, they will be blood work, cultures and biopsy results pending at the time of your discharge. Please request that your primary care M.D. goes through all the records of your hospital data and follows up on these results.   Also  Note the following: If you experience worsening of your admission symptoms, develop shortness of breath, life threatening emergency, suicidal or homicidal thoughts you must seek medical attention immediately by calling 911 or calling your MD  immediately  if symptoms less severe.   You must read complete instructions/literature along with all the possible adverse reactions/side effects for all the Medicines you take and that have been prescribed to you. Take any new Medicines after you have completely understood and accpet all the possible adverse reactions/side effects.    patient was instructed, not to drive, operate heavy machinery, perform activities at heights, swimming or participation in water  activities or provide baby-sitting services while on Pain, Sleep and Anxiety Medications; until their outpatient Physician has advised to do so again. Also recommended to not to take more than prescribed Pain, Sleep and Anxiety Medications.  It is not advisable to combine anxiety, sleep and pain medications without talking with your primary care provider.     Wear Seat belts while driving.   Please note: You were cared for by a hospitalist during your hospital stay. Once you are discharged, your primary care physician will handle any further medical issues. Please note that NO REFILLS for any discharge medications will be authorized once you are discharged, as it is imperative that you return to your primary care physician (or establish a relationship with a primary care physician if you do not have one) for your post hospital discharge needs so that they can reassess your need for medications and monitor your lab values  Time coordinating discharge: Over 30 minutes  SIGNED:   Fredia Skeeter, MD  [facility] 06/26/2024, 8:45 AM *Please note that this is a verbal dictation therefore any spelling or grammatical errors are due to the Dragon Medical One system interpretation. If 7PM-7AM, please contact night-coverage [email or web address]     [1]  Allergies Allergen Reactions   Crestor  [Rosuvastatin  Calcium ] Other (See Comments)    Muscle aches     Lipitor [Atorvastatin] Other (See Comments)    Muscle aches    Hctz  [Hydrochlorothiazide] Other (See Comments)    Pt doesn't remember

## 2024-06-26 NOTE — Progress Notes (Signed)
 Occupational Therapy Treatment Patient Details Name: Lauren Clark MRN: [id number] DOB: 1956-04-29 Today's Date: 06/26/2024   History of present illness 68 yo F adm 12/13 UTI with burning. Pt with AKI, L pyelonephrtis. PMH anemia, anxiety, aortic dissection COPD, arthritis, depression, glaucoma, blind L eye, limited vision r eye, obese, PVD, gastric sleeve, R THA, HTN, HLD, CKD III,CAD, afib on eliquis , HFpEF, aortic aneurysm.   OT comments  Patient received in supine and stating she was not feeling well. Patient was encouraged to get OOB to progress to home. Patient appears to have decreased activity tolerance on this date. Patient performed self care tasks in sitting due to weakness. Patient ambulated from sink to recliner and appeared winded with SpO2 94% on 2 liters. Discharge recommendations continue to be appropriate. Acute OT to continue to follow to address established goals.       If plan is discharge home, recommend the following:  A little help with walking and/or transfers;A little help with bathing/dressing/bathroom;Assistance with cooking/housework   Equipment Recommendations  None recommended by OT    Recommendations for Other Services      Precautions / Restrictions Precautions Precautions: Fall;Other (comment) Recall of Precautions/Restrictions: Intact Precaution/Restrictions Comments: watch O2 sats       Mobility Bed Mobility Overal bed mobility: Needs Assistance Bed Mobility: Supine to Sit     Supine to sit: Supervision, HOB elevated     General bed mobility comments: increased time    Transfers Overall transfer level: Needs assistance Equipment used: Rolling walker (2 wheels) Transfers: Sit to/from Stand Sit to Stand: Supervision           General transfer comment: supervision for safety     Balance Overall balance assessment: Needs assistance Sitting-balance support: Bilateral upper extremity supported, Feet supported Sitting balance-Leahy  Scale: Good Sitting balance - Comments: EOB   Standing balance support: Single extremity supported, Bilateral upper extremity supported, During functional activity Standing balance-Leahy Scale: Poor Standing balance comment: decreased activity tolerance today                           ADL either performed or assessed with clinical judgement   ADL Overall ADL's : Needs assistance/impaired     Grooming: Wash/dry hands;Wash/dry face;Brushing hair;Set up;Sitting Grooming Details (indicate cue type and reason): performed in sitting due to not feeling well, weak Upper Body Bathing: Set up;Sitting Upper Body Bathing Details (indicate cue type and reason): at sink Lower Body Bathing: Minimal assistance;Sit to/from stand   Upper Body Dressing : Set up;Sitting Upper Body Dressing Details (indicate cue type and reason): gown change Lower Body Dressing: Moderate assistance;Sitting/lateral leans Lower Body Dressing Details (indicate cue type and reason): to change socks               General ADL Comments: decreased activity tolerance and patient stating she not feeling well, performed self care tasks in sitting    Extremity/Trunk Assessment              Vision       Perception     Praxis     Communication Communication Communication: No apparent difficulties   Cognition Arousal: Alert Behavior During Therapy: WFL for tasks assessed/performed Cognition: No apparent impairments             OT - Cognition Comments: patient stating she was not feeling well today                 Following  commands: Intact        Cueing   Cueing Techniques: Verbal cues  Exercises      Shoulder Instructions       General Comments SpO2 94% on 2 liters    Pertinent Vitals/ Pain       Pain Assessment Pain Assessment: Faces Faces Pain Scale: Hurts a little bit Pain Location: generalized Pain Descriptors / Indicators: Discomfort Pain Intervention(s): Limited  activity within patient's tolerance, Monitored during session, Repositioned  Home Living                                          Prior Functioning/Environment              Frequency  Min 2X/week        Progress Toward Goals  OT Goals(current goals can now be found in the care plan section)  Progress towards OT goals: Progressing toward goals     Plan      Co-evaluation                 AM-PAC OT 6 Clicks Daily Activity     Outcome Measure   Help from another person eating meals?: None Help from another person taking care of personal grooming?: A Little Help from another person toileting, which includes using toliet, bedpan, or urinal?: A Little Help from another person bathing (including washing, rinsing, drying)?: A Little Help from another person to put on and taking off regular upper body clothing?: A Little Help from another person to put on and taking off regular lower body clothing?: A Lot 6 Click Score: 18    End of Session Equipment Utilized During Treatment: Gait belt;Rolling walker (2 wheels);Oxygen (2 liters)  OT Visit Diagnosis: Unsteadiness on feet (R26.81);Other abnormalities of gait and mobility (R26.89);Muscle weakness (generalized) (M62.81);History of falling (Z91.81);Low vision, both eyes (H54.2)   Activity Tolerance Patient tolerated treatment well   Patient Left in chair;with call bell/phone within reach;with chair alarm set   Nurse Communication Mobility status;Precautions        Time: [phone] OT Time Calculation (min): 26 min  Charges: OT General Charges $OT Visit: 1 Visit OT Treatments $Self Care/Home Management : 23-37 mins  Dick Laine, OTA Acute Rehabilitation Services  Office [phone]   Jeb LITTIE Laine 06/26/2024, 1:19 PM

## 2024-06-26 NOTE — TOC Transition Note (Signed)
 Transition of Care [facility]) - Discharge Note   Patient Details  Name: Lauren Clark MRN: [id number] Date of Birth: [birth year]  Transition of Care [facility]) CM/SW Contact:  Andrez JULIANNA George, RN Phone Number: 06/26/2024, 9:17 AM   Clinical Narrative:     Pt is discharging home with home health through [facility]. Information on the AVS.  Rollator ordered through [employer] and will be delivered to the room.  Pt has transportation home.  Final next level of care: Home w Home Health Services Barriers to Discharge: No Barriers Identified   Patient Goals and CMS Choice            Discharge Placement                       Discharge Plan and Services Additional resources added to the After Visit Summary for     Discharge Planning Services: CM Consult Post Acute Care Choice: Home Health, Durable Medical Equipment          DME Arranged: Walker rolling with seat DME Agency: [employer] Date DME Agency Contacted: 06/17/24 Time DME Agency Contacted: 1441 Representative spoke with at DME Agency: [place] HH Arranged: PT, OT HH Agency: [facility] Date [facility] Agency Contacted: 06/17/24 Time HH Agency Contacted: 1400 Representative spoke with at [facility] Agency: [place]  Social Drivers of Health (SDOH) Interventions SDOH Screenings   Food Insecurity: No Food Insecurity (06/15/2024)  Housing: Low Risk (06/15/2024)  Transportation Needs: No Transportation Needs (06/15/2024)  Utilities: Not At Risk (06/15/2024)  Social Connections: Moderately Integrated (06/15/2024)  Tobacco Use: Low Risk (06/15/2024)     Readmission Risk Interventions     No data to display

## 2024-06-26 NOTE — Plan of Care (Signed)
   Problem: Education: Goal: Knowledge of General Education information will improve Description Including pain rating scale, medication(s)/side effects and non-pharmacologic comfort measures Outcome: Progressing   Problem: Health Behavior/Discharge Planning: Goal: Ability to manage health-related needs will improve Outcome: Progressing

## 2024-06-26 NOTE — Progress Notes (Signed)
 Orthopedic Tech Progress Note Patient Details:  Lauren Clark 1955-11-20 [id number] LV2T FOT. No orders at this time. Patient ID: Lauren Clark, female   DOB: 31-Dec-1955, 68 y.o.   MRN: [id number]  [name] 06/26/2024, 11:13 PM

## 2024-06-26 NOTE — ED Notes (Signed)
 Trauma Response Nurse Documentation   Lauren Clark is a 68 y.o. female arriving to [facility] ED via EMS  On Eliquis  (apixaban ) daily. Trauma was activated as a Level 2 by ED charge RN based on the following trauma criteria {Trauma criteria:26865}.  Patient cleared for CT by Dr. FERNAND Pt transported to {TRN Radiology:26861::CT} with trauma response nurse present to monitor. RN remained with the patient throughout their absence from the department for clinical observation.   [facility] ***.  Trauma MD Arrival Time: ***.  History   Past Medical History:  Diagnosis Date   Anemia    s/p knee surgery   Anxiety    Aortic dissection (HCC)    Arthritis    COPD (chronic obstructive pulmonary disease) (HCC) 06/15/2024   Depression    GERD (gastroesophageal reflux disease)    Glaucoma    right eye uses drops   History of blood transfusion    [facility]   History of MRSA infection    Hyperlipidemia    Hypertension    Hypothyroidism    Impaired vision in both eyes    blind left eye, limited vision right eye   Morbid obesity (HCC)    s/p Gastric sleeve- 8'17(loss thus far approx. 100 lbs)   Peripheral vascular disease    dvt right leg   Pre-diabetes    prior to gastric sleeve     Past Surgical History:  Procedure Laterality Date   ABDOMINAL HYSTERECTOMY  1981   with bilateral BSO   BREAST BIOPSY Right 10/18/2023   MM RT BREAST BX W LOC DEV 1ST LESION IMAGE BX SPEC STEREO GUIDE 10/18/2023 [facility]   CATARACT EXTRACTION Right 2010   [facility]   COLONOSCOPY     DEBRIDEMENT AND CLOSURE WOUND Right 07/07/2014   Dr. Renato   EYE SURGERY     bil cataraction with IOL as a child, cataract surgery late 1990- right   JOINT REPLACEMENT Right 2016   knee   KNEE DEBRIDEMENT Right 06/22/2014   Dr. Reyes Boer with wound vac   KNEE SURGERY     i&d KNEE POST REPLACEMENT   LAPAROSCOPIC GASTRIC SLEEVE RESECTION N/A 02/29/2016   Procedure:  LAPAROSCOPIC GASTRIC SLEEVE RESECTION, UPPER ENDO;  Surgeon: Camellia Blush, MD;  Location: [facility] ORS;  Service: General;  Laterality: N/A;   TONSILLECTOMY     TOTAL HIP ARTHROPLASTY Right 06/14/2016   Procedure: RIGHT TOTAL HIP ARTHROPLASTY ANTERIOR APPROACH;  Surgeon: Donnice Car, MD;  Location: [facility] ORS;  Service: Orthopedics;  Laterality: Right;   TOTAL KNEE ARTHROPLASTY Left 09/05/2017   Procedure: LEFT TOTAL KNEE ARTHROPLASTY;  Surgeon: Car Donnice, MD;  Location: [facility] ORS;  Service: Orthopedics;  Laterality: Left;  Adductor Block   UPPER GI ENDOSCOPY  01/25/2016       Initial Focused Assessment (If applicable, or please see trauma documentation):   CT's Completed:   {Trauma CT:26866}   Interventions:   Plan for disposition:  {Trauma Dispo:26867}   Consults completed:  {Trauma Consults:26862} at ***.  Event Summary:  MTP Summary (If applicable):   Bedside handoff with {Trauma handoff:26863::ED RN} ***.    Billyjack Trompeter O Talishia Betzler  Trauma Response RN  Please call TRN at [phone] for further assistance.

## 2024-06-26 NOTE — ED Provider Notes (Signed)
 " Lauren Clark AT [facility] Provider Note   CSN: [id number] Arrival date & time: 06/26/24  2305     Patient presents with: Lauren Clark [name] is a 68 y.o. female.  {Add pertinent medical, surgical, social history, OB history to YEP:67052} The history is provided by the patient and medical records.   68 y.o. F with hx of CHF, CKD III, chronic AFIB on eliquis , hypothyroidism, dyslipidemia, presenting to the ED as a level 2 FOT.  Patient was discharged from inpatient floor this AM.  She states she had worsening SOB throughout the day today and fell while trying to get back into her bed.  She laid on the floor for approx 2 hours before EMS arrived.  Unclear if LOC or not, thinks possibly so.  She wears 2L O2 chronically, has required increased O2 en route.  Currently states her knees and left foot are hurting more than usual right now.  Prior to Admission medications  Medication Sig Start Date End Date Taking? Authorizing Provider  BELSOMRA  10 MG TABS Take 1 tablet by mouth at bedtime. 11/03/23   [provider]  brimonidine -timolol  (COMBIGAN ) 0.2-0.5 % ophthalmic solution Place 1 drop into the right eye 2 (two) times daily. 06/10/24   [provider]  CALCIUM  CITRATE PO Take 600 mg by mouth daily.    [provider]  Cyanocobalamin  (VITAMIN B12 PO) Take 1 tablet by mouth in the morning.    [provider]  ELIQUIS  5 MG TABS tablet TAKE 1 TABLET(5 MG) BY MOUTH TWICE DAILY 02/09/24   Hilty, Vinie BROCKS, MD  [Paused] empagliflozin (JARDIANCE) 10 MG TABS tablet Take 10 mg by mouth daily. Wait to take this until: June 28, 2024    [provider]  FLUoxetine  (PROZAC ) 40 MG capsule Take 40 mg by mouth daily. 08/29/22   [provider]  [Paused] furosemide  (LASIX ) 40 MG tablet Take 1 tablet (40 mg total) by mouth daily. Wait to take this until: June 27, 2024 04/08/24   Mona Vinie BROCKS, MD  latanoprost  (XALATAN )  0.005 % ophthalmic solution Place 1 drop into the right eye at bedtime.     [provider]  levothyroxine  (SYNTHROID ) 112 MCG tablet Take 112 mcg by mouth every morning. 09/08/23   [provider]  magnesium  oxide (MAG-OX) 400 (240 Mg) MG tablet Take 400 mg by mouth daily.    [provider]  NON FORMULARY Take 1 tablet by mouth 2 (two) times daily. Cranberry conascorib acid    [provider]  Omega 3-6-9 Fatty Acids (TRIPLE OMEGA-3-6-9 PO) Take 1 capsule by mouth daily.    [provider]  rosuvastatin  (CRESTOR ) 40 MG tablet Take 40 mg by mouth in the morning. 08/01/23   [provider]  Skin Protectants, Misc. (MINERIN CREME EX) Apply 1 application  topically daily.    [provider]    Allergies: Crestor  [rosuvastatin  calcium ], Lipitor [atorvastatin], and Hctz [hydrochlorothiazide]    Review of Systems  Respiratory:  Positive for shortness of breath.   Musculoskeletal:  Positive for arthralgias.  All other systems reviewed and are negative.   Updated Vital Signs BP 123/74   Pulse 90   Temp 98.1 F (36.7 C) (Oral)   Resp (!) 29   Ht 5' 5 (1.651 m)   Wt 91.6 kg   SpO2 (!) 89%   BMI 33.61 kg/m   Physical Exam Vitals and nursing note reviewed.  Constitutional:  Appearance: She is well-developed.  HENT:     Head: Normocephalic and atraumatic.  Eyes:     Conjunctiva/sclera: Conjunctivae normal.     Pupils: Pupils are equal, round, and reactive to light.  Cardiovascular:     Rate and Rhythm: Normal rate and regular rhythm.     Heart sounds: Normal heart sounds.  Pulmonary:     Effort: Pulmonary effort is normal.     Breath sounds: Wheezing present.     Comments: Diffuse wheezes, rattling cough noted on exam Abdominal:     General: Bowel sounds are normal.     Palpations: Abdomen is soft.  Musculoskeletal:        General: Normal range of motion.     Cervical back: Normal range of motion.     Comments:  Bruising noted to left foot, particularly around the great toe and into the arch of the foot, maintains DP pulse Mild tenderness to bilateral knees   Skin:    General: Skin is warm and dry.  Neurological:     Mental Status: She is alert and oriented to person, place, and time.     (all labs ordered are listed, but only abnormal results are displayed) Labs Reviewed  RESP PANEL BY RT-PCR (RSV, FLU A&B, COVID)  [id number]  COMPREHENSIVE METABOLIC PANEL WITH GFR  CBC  URINALYSIS, ROUTINE W REFLEX MICROSCOPIC  PROTIME-INR  PRO BRAIN NATRIURETIC PEPTIDE  CK  I-STAT CHEM 8, ED  SAMPLE TO BLOOD BANK  TROPONIN T, HIGH SENSITIVITY    EKG: None  Radiology: No results found.  {Document cardiac monitor, telemetry assessment procedure when appropriate:32947} Procedures   Medications Ordered in the ED - No data to display    {Click here for ABCD2, HEART and other calculators REFRESH Note before signing:1}                              Medical Decision Making Amount and/or Complexity of Data Reviewed Labs: ordered. Radiology: ordered.  Risk Prescription drug management.   ***  {Document critical care time when appropriate  Document review of labs and clinical decision tools ie CHADS2VASC2, etc  Document your independent review of radiology images and any outside records  Document your discussion with family members, caretakers and with consultants  Document social determinants of health affecting pt's care  Document your decision making why or why not admission, treatments were needed:32947:::1}   Final diagnoses:  None    ED Discharge Orders     None        "

## 2024-06-26 NOTE — ED Notes (Signed)
 EKG obtained and provided to Dr. Jerral.

## 2024-06-26 NOTE — ED Triage Notes (Signed)
 Pt BIB EMS from home. EMS reports pt was discharged earlier today, pt had worsening SHOB and fell when attempting to get into bed. Pt possibly lost consciousness with fall, but is unsure, pt also reports being on floor for roughly 2 hrs prior to EMS arrival.  EMS reports initial lung sounds clear and equal, but have become diminished.  Pt wears 2L 02 baseline, initially 94% on 2L, pt desat to 80s, 88% on nebulizer mask on arrival. Pt takes eliquis .   EMS admin 10mg  albuterol  EMS VS 144/96, HR 80, 20 RR, 152 cbg

## 2024-06-27 ENCOUNTER — Emergency Department (HOSPITAL_COMMUNITY)

## 2024-06-27 ENCOUNTER — Inpatient Hospital Stay (HOSPITAL_COMMUNITY)

## 2024-06-27 DIAGNOSIS — N210 Calculus in bladder: Secondary | ICD-10-CM | POA: Diagnosis not present

## 2024-06-27 DIAGNOSIS — I5023 Acute on chronic systolic (congestive) heart failure: Secondary | ICD-10-CM | POA: Diagnosis not present

## 2024-06-27 DIAGNOSIS — N1831 Chronic kidney disease, stage 3a: Secondary | ICD-10-CM | POA: Diagnosis not present

## 2024-06-27 DIAGNOSIS — N136 Pyonephrosis: Secondary | ICD-10-CM | POA: Diagnosis present

## 2024-06-27 DIAGNOSIS — J440 Chronic obstructive pulmonary disease with acute lower respiratory infection: Secondary | ICD-10-CM | POA: Diagnosis present

## 2024-06-27 DIAGNOSIS — J1000 Influenza due to other identified influenza virus with unspecified type of pneumonia: Secondary | ICD-10-CM | POA: Diagnosis not present

## 2024-06-27 DIAGNOSIS — J9621 Acute and chronic respiratory failure with hypoxia: Secondary | ICD-10-CM | POA: Diagnosis present

## 2024-06-27 DIAGNOSIS — E1122 Type 2 diabetes mellitus with diabetic chronic kidney disease: Secondary | ICD-10-CM | POA: Diagnosis present

## 2024-06-27 DIAGNOSIS — Y92009 Unspecified place in unspecified non-institutional (private) residence as the place of occurrence of the external cause: Secondary | ICD-10-CM | POA: Diagnosis not present

## 2024-06-27 DIAGNOSIS — J690 Pneumonitis due to inhalation of food and vomit: Secondary | ICD-10-CM | POA: Diagnosis not present

## 2024-06-27 DIAGNOSIS — R7401 Elevation of levels of liver transaminase levels: Secondary | ICD-10-CM | POA: Diagnosis present

## 2024-06-27 DIAGNOSIS — R579 Shock, unspecified: Secondary | ICD-10-CM | POA: Diagnosis not present

## 2024-06-27 DIAGNOSIS — I130 Hypertensive heart and chronic kidney disease with heart failure and stage 1 through stage 4 chronic kidney disease, or unspecified chronic kidney disease: Secondary | ICD-10-CM | POA: Diagnosis present

## 2024-06-27 DIAGNOSIS — E039 Hypothyroidism, unspecified: Secondary | ICD-10-CM | POA: Diagnosis present

## 2024-06-27 DIAGNOSIS — W1830XA Fall on same level, unspecified, initial encounter: Secondary | ICD-10-CM | POA: Diagnosis present

## 2024-06-27 DIAGNOSIS — Z515 Encounter for palliative care: Secondary | ICD-10-CM

## 2024-06-27 DIAGNOSIS — N390 Urinary tract infection, site not specified: Secondary | ICD-10-CM

## 2024-06-27 DIAGNOSIS — J449 Chronic obstructive pulmonary disease, unspecified: Secondary | ICD-10-CM | POA: Diagnosis not present

## 2024-06-27 DIAGNOSIS — R0609 Other forms of dyspnea: Secondary | ICD-10-CM

## 2024-06-27 DIAGNOSIS — I5181 Takotsubo syndrome: Secondary | ICD-10-CM | POA: Diagnosis not present

## 2024-06-27 DIAGNOSIS — J1008 Influenza due to other identified influenza virus with other specified pneumonia: Secondary | ICD-10-CM | POA: Diagnosis present

## 2024-06-27 DIAGNOSIS — L89812 Pressure ulcer of head, stage 2: Secondary | ICD-10-CM | POA: Diagnosis present

## 2024-06-27 DIAGNOSIS — I110 Hypertensive heart disease with heart failure: Secondary | ICD-10-CM | POA: Diagnosis not present

## 2024-06-27 DIAGNOSIS — I5020 Unspecified systolic (congestive) heart failure: Secondary | ICD-10-CM | POA: Diagnosis not present

## 2024-06-27 DIAGNOSIS — I5033 Acute on chronic diastolic (congestive) heart failure: Secondary | ICD-10-CM | POA: Diagnosis not present

## 2024-06-27 DIAGNOSIS — I712 Thoracic aortic aneurysm, without rupture, unspecified: Secondary | ICD-10-CM | POA: Diagnosis not present

## 2024-06-27 DIAGNOSIS — R6520 Severe sepsis without septic shock: Secondary | ICD-10-CM | POA: Diagnosis not present

## 2024-06-27 DIAGNOSIS — R531 Weakness: Secondary | ICD-10-CM

## 2024-06-27 DIAGNOSIS — R6521 Severe sepsis with septic shock: Secondary | ICD-10-CM | POA: Diagnosis present

## 2024-06-27 DIAGNOSIS — R7989 Other specified abnormal findings of blood chemistry: Secondary | ICD-10-CM

## 2024-06-27 DIAGNOSIS — I5021 Acute systolic (congestive) heart failure: Secondary | ICD-10-CM | POA: Diagnosis not present

## 2024-06-27 DIAGNOSIS — A419 Sepsis, unspecified organism: Secondary | ICD-10-CM | POA: Diagnosis present

## 2024-06-27 DIAGNOSIS — D689 Coagulation defect, unspecified: Secondary | ICD-10-CM | POA: Diagnosis present

## 2024-06-27 DIAGNOSIS — I5043 Acute on chronic combined systolic (congestive) and diastolic (congestive) heart failure: Secondary | ICD-10-CM | POA: Diagnosis present

## 2024-06-27 DIAGNOSIS — J159 Unspecified bacterial pneumonia: Secondary | ICD-10-CM | POA: Diagnosis present

## 2024-06-27 DIAGNOSIS — I480 Paroxysmal atrial fibrillation: Secondary | ICD-10-CM | POA: Diagnosis not present

## 2024-06-27 DIAGNOSIS — I4819 Other persistent atrial fibrillation: Secondary | ICD-10-CM | POA: Diagnosis not present

## 2024-06-27 DIAGNOSIS — I7121 Aneurysm of the ascending aorta, without rupture: Secondary | ICD-10-CM | POA: Diagnosis present

## 2024-06-27 DIAGNOSIS — Z66 Do not resuscitate: Secondary | ICD-10-CM

## 2024-06-27 DIAGNOSIS — F32A Depression, unspecified: Secondary | ICD-10-CM | POA: Diagnosis present

## 2024-06-27 DIAGNOSIS — B952 Enterococcus as the cause of diseases classified elsewhere: Secondary | ICD-10-CM | POA: Diagnosis present

## 2024-06-27 DIAGNOSIS — N179 Acute kidney failure, unspecified: Secondary | ICD-10-CM | POA: Diagnosis present

## 2024-06-27 DIAGNOSIS — K8030 Calculus of bile duct with cholangitis, unspecified, without obstruction: Secondary | ICD-10-CM | POA: Diagnosis present

## 2024-06-27 DIAGNOSIS — G9341 Metabolic encephalopathy: Secondary | ICD-10-CM | POA: Diagnosis present

## 2024-06-27 DIAGNOSIS — I4891 Unspecified atrial fibrillation: Secondary | ICD-10-CM

## 2024-06-27 DIAGNOSIS — N12 Tubulo-interstitial nephritis, not specified as acute or chronic: Secondary | ICD-10-CM | POA: Diagnosis not present

## 2024-06-27 DIAGNOSIS — E66813 Obesity, class 3: Secondary | ICD-10-CM | POA: Diagnosis present

## 2024-06-27 DIAGNOSIS — J441 Chronic obstructive pulmonary disease with (acute) exacerbation: Secondary | ICD-10-CM | POA: Diagnosis not present

## 2024-06-27 DIAGNOSIS — Z7901 Long term (current) use of anticoagulants: Secondary | ICD-10-CM | POA: Diagnosis not present

## 2024-06-27 DIAGNOSIS — J101 Influenza due to other identified influenza virus with other respiratory manifestations: Secondary | ICD-10-CM | POA: Diagnosis not present

## 2024-06-27 DIAGNOSIS — I340 Nonrheumatic mitral (valve) insufficiency: Secondary | ICD-10-CM | POA: Diagnosis not present

## 2024-06-27 DIAGNOSIS — N189 Chronic kidney disease, unspecified: Secondary | ICD-10-CM | POA: Diagnosis not present

## 2024-06-27 DIAGNOSIS — K7200 Acute and subacute hepatic failure without coma: Secondary | ICD-10-CM | POA: Diagnosis present

## 2024-06-27 DIAGNOSIS — J9601 Acute respiratory failure with hypoxia: Secondary | ICD-10-CM | POA: Diagnosis not present

## 2024-06-27 LAB — ECHOCARDIOGRAM COMPLETE
AR max vel: 2.27 cm2
AV Area VTI: 2.79 cm2
AV Area mean vel: 2.68 cm2
AV Mean grad: 4.1 mmHg
AV Peak grad: 10.4 mmHg
Ao pk vel: 1.61 m/s
Area-P 1/2: 4.57 cm2
Height: 65 in
S' Lateral: 3.6 cm
Weight: 3232 [oz_av]

## 2024-06-27 LAB — HEPATITIS PANEL, ACUTE
HCV Ab: NONREACTIVE
Hep A IgM: NONREACTIVE
Hep B C IgM: NONREACTIVE
Hepatitis B Surface Ag: NONREACTIVE

## 2024-06-27 LAB — COMPREHENSIVE METABOLIC PANEL WITH GFR
ALT: 2076 U/L — ABNORMAL HIGH (ref 0–44)
ALT: 2684 U/L — ABNORMAL HIGH (ref 0–44)
AST: 5581 U/L — ABNORMAL HIGH (ref 15–41)
AST: 6589 U/L — ABNORMAL HIGH (ref 15–41)
Albumin: 3.2 g/dL — ABNORMAL LOW (ref 3.5–5.0)
Albumin: 3 g/dL — ABNORMAL LOW (ref 3.5–5.0)
Alkaline Phosphatase: 203 U/L — ABNORMAL HIGH (ref 38–126)
Alkaline Phosphatase: 189 U/L — ABNORMAL HIGH (ref 38–126)
Anion gap: 15 (ref 5–15)
Anion gap: 20 — ABNORMAL HIGH (ref 5–15)
BUN: 59 mg/dL — ABNORMAL HIGH (ref 8–23)
BUN: 64 mg/dL — ABNORMAL HIGH (ref 8–23)
CO2: 19 mmol/L — ABNORMAL LOW (ref 22–32)
CO2: 16 mmol/L — ABNORMAL LOW (ref 22–32)
Calcium: 8.7 mg/dL — ABNORMAL LOW (ref 8.9–10.3)
Calcium: 8.6 mg/dL — ABNORMAL LOW (ref 8.9–10.3)
Chloride: 98 mmol/L (ref 98–111)
Chloride: 98 mmol/L (ref 98–111)
Creatinine, Ser: 3.36 mg/dL — ABNORMAL HIGH (ref 0.44–1.00)
Creatinine, Ser: 3.63 mg/dL — ABNORMAL HIGH (ref 0.44–1.00)
GFR, Estimated: 14 mL/min — ABNORMAL LOW
GFR, Estimated: 13 mL/min — ABNORMAL LOW
Glucose, Bld: 170 mg/dL — ABNORMAL HIGH (ref 70–99)
Glucose, Bld: 229 mg/dL — ABNORMAL HIGH (ref 70–99)
Potassium: 4.7 mmol/L (ref 3.5–5.1)
Potassium: 4.7 mmol/L (ref 3.5–5.1)
Sodium: 132 mmol/L — ABNORMAL LOW (ref 135–145)
Sodium: 134 mmol/L — ABNORMAL LOW (ref 135–145)
Total Bilirubin: 1.2 mg/dL (ref 0.0–1.2)
Total Bilirubin: 1 mg/dL (ref 0.0–1.2)
Total Protein: 6.4 g/dL — ABNORMAL LOW (ref 6.5–8.1)
Total Protein: 6 g/dL — ABNORMAL LOW (ref 6.5–8.1)

## 2024-06-27 LAB — CBC
HCT: 38.6 % (ref 36.0–46.0)
HCT: 36.7 % (ref 36.0–46.0)
Hemoglobin: 12.2 g/dL (ref 12.0–15.0)
Hemoglobin: 11.6 g/dL — ABNORMAL LOW (ref 12.0–15.0)
MCH: 29.8 pg (ref 26.0–34.0)
MCH: 29.8 pg (ref 26.0–34.0)
MCHC: 31.6 g/dL (ref 30.0–36.0)
MCHC: 31.6 g/dL (ref 30.0–36.0)
MCV: 94.1 fL (ref 80.0–100.0)
MCV: 94.3 fL (ref 80.0–100.0)
Platelets: 313 K/uL (ref 150–400)
Platelets: 241 K/uL (ref 150–400)
RBC: 4.1 MIL/uL (ref 3.87–5.11)
RBC: 3.89 MIL/uL (ref 3.87–5.11)
RDW: 15.6 % — ABNORMAL HIGH (ref 11.5–15.5)
RDW: 15.8 % — ABNORMAL HIGH (ref 11.5–15.5)
WBC: 35.1 K/uL — ABNORMAL HIGH (ref 4.0–10.5)
WBC: 26.5 K/uL — ABNORMAL HIGH (ref 4.0–10.5)
nRBC: 0 % (ref 0.0–0.2)
nRBC: 0.1 % (ref 0.0–0.2)

## 2024-06-27 LAB — MAGNESIUM
Magnesium: 3.2 mg/dL — ABNORMAL HIGH (ref 1.7–2.4)
Magnesium: 3.1 mg/dL — ABNORMAL HIGH (ref 1.7–2.4)

## 2024-06-27 LAB — I-STAT CG4 LACTIC ACID, ED: Lactic Acid, Venous: 3.3 mmol/L (ref 0.5–1.9)

## 2024-06-27 LAB — I-STAT ARTERIAL BLOOD GAS, ED
Acid-base deficit: 7 mmol/L — ABNORMAL HIGH (ref 0.0–2.0)
Bicarbonate: 18.2 mmol/L — ABNORMAL LOW (ref 20.0–28.0)
Calcium, Ion: 1.14 mmol/L — ABNORMAL LOW (ref 1.15–1.40)
HCT: 37 % (ref 36.0–46.0)
Hemoglobin: 12.6 g/dL (ref 12.0–15.0)
O2 Saturation: 95 %
Patient temperature: 36.8
Potassium: 4.2 mmol/L (ref 3.5–5.1)
Sodium: 132 mmol/L — ABNORMAL LOW (ref 135–145)
TCO2: 19 mmol/L — ABNORMAL LOW (ref 22–32)
pCO2 arterial: 35.4 mmHg (ref 32–48)
pH, Arterial: 7.318 — ABNORMAL LOW (ref 7.35–7.45)
pO2, Arterial: 81 mmHg — ABNORMAL LOW (ref 83–108)

## 2024-06-27 LAB — MRSA NEXT GEN BY PCR, NASAL: MRSA by PCR Next Gen: NOT DETECTED

## 2024-06-27 LAB — TROPONIN T, HIGH SENSITIVITY
Troponin T High Sensitivity: 157 ng/L (ref 0–19)
Troponin T High Sensitivity: 323 ng/L (ref 0–19)
Troponin T High Sensitivity: 365 ng/L (ref 0–19)

## 2024-06-27 LAB — BLOOD GAS, VENOUS
Acid-base deficit: 6.9 mmol/L — ABNORMAL HIGH (ref 0.0–2.0)
Bicarbonate: 18.5 mmol/L — ABNORMAL LOW (ref 20.0–28.0)
Drawn by: 66879
O2 Saturation: 92.7 %
Patient temperature: 36.7
pCO2, Ven: 35 mmHg — ABNORMAL LOW (ref 44–60)
pH, Ven: 7.32 (ref 7.25–7.43)
pO2, Ven: 62 mmHg — ABNORMAL HIGH (ref 32–45)

## 2024-06-27 LAB — RESP PANEL BY RT-PCR (RSV, FLU A&B, COVID)  RVPGX2
Influenza A by PCR: POSITIVE — AB
Influenza B by PCR: NEGATIVE
Resp Syncytial Virus by PCR: NEGATIVE
SARS Coronavirus 2 by RT PCR: NEGATIVE

## 2024-06-27 LAB — COOXEMETRY PANEL
Carboxyhemoglobin: 1.6 % — ABNORMAL HIGH (ref 0.5–1.5)
Methemoglobin: <0.7 % (ref 0.0–1.5)
O2 Saturation: 61.2 %
Total hemoglobin: 10.6 g/dL — ABNORMAL LOW (ref 12.0–16.0)

## 2024-06-27 LAB — SAMPLE TO BLOOD BANK

## 2024-06-27 LAB — URINALYSIS, ROUTINE W REFLEX MICROSCOPIC
Bilirubin Urine: NEGATIVE
Glucose, UA: NEGATIVE mg/dL
Ketones, ur: NEGATIVE mg/dL
Nitrite: NEGATIVE
Protein, ur: 100 mg/dL — AB
Specific Gravity, Urine: 1.011 (ref 1.005–1.030)
pH: 7 (ref 5.0–8.0)

## 2024-06-27 LAB — LACTIC ACID, PLASMA
Lactic Acid, Venous: 1.8 mmol/L (ref 0.5–1.9)
Lactic Acid, Venous: 1.7 mmol/L (ref 0.5–1.9)

## 2024-06-27 LAB — PRO BRAIN NATRIURETIC PEPTIDE
Pro Brain Natriuretic Peptide: 8463 pg/mL — ABNORMAL HIGH
Pro Brain Natriuretic Peptide: 32843 pg/mL — ABNORMAL HIGH

## 2024-06-27 LAB — PHOSPHORUS: Phosphorus: 5.7 mg/dL — ABNORMAL HIGH (ref 2.5–4.6)

## 2024-06-27 LAB — CK
Total CK: 385 U/L — ABNORMAL HIGH (ref 38–234)
Total CK: 894 U/L — ABNORMAL HIGH (ref 38–234)

## 2024-06-27 LAB — APTT: aPTT: 48 s — ABNORMAL HIGH (ref 24–36)

## 2024-06-27 LAB — PROCALCITONIN: Procalcitonin: 16 ng/mL

## 2024-06-27 LAB — ACETAMINOPHEN LEVEL: Acetaminophen (Tylenol), Serum: <10 ug/mL — ABNORMAL LOW (ref 10–30)

## 2024-06-27 MED ORDER — DEXTROSE 5 % IV SOLN
12.5000 mg/kg/h | INTRAVENOUS | Status: AC
  Administered 2024-06-27: 12.5 mg/kg/h via INTRAVENOUS
  Filled 2024-06-27: qty 90

## 2024-06-27 MED ORDER — HEPARIN (PORCINE) 25000 UT/250ML-% IV SOLN
1200.0000 [IU]/h | INTRAVENOUS | Status: DC
  Administered 2024-06-27: 950 [IU]/h via INTRAVENOUS
  Administered 2024-06-28: 1200 [IU]/h via INTRAVENOUS
  Filled 2024-06-27 (×2): qty 250

## 2024-06-27 MED ORDER — OSELTAMIVIR PHOSPHATE 30 MG PO CAPS
30.0000 mg | ORAL_CAPSULE | Freq: Once | ORAL | Status: DC
  Filled 2024-06-27: qty 1

## 2024-06-27 MED ORDER — PIPERACILLIN-TAZOBACTAM IN DEX 2-0.25 GM/50ML IV SOLN
2.2500 g | Freq: Three times a day (TID) | INTRAVENOUS | Status: DC
  Administered 2024-06-27 – 2024-07-01 (×11): 2.25 g via INTRAVENOUS
  Filled 2024-06-27 (×11): qty 50

## 2024-06-27 MED ORDER — HEPARIN BOLUS VIA INFUSION
2250.0000 [IU] | Freq: Once | INTRAVENOUS | Status: AC
  Administered 2024-06-27: 2250 [IU] via INTRAVENOUS
  Filled 2024-06-27: qty 2250

## 2024-06-27 MED ORDER — CALCIUM GLUCONATE-NACL 1-0.675 GM/50ML-% IV SOLN
1.0000 g | Freq: Once | INTRAVENOUS | Status: AC
  Administered 2024-06-27: 1000 mg via INTRAVENOUS
  Filled 2024-06-27: qty 50

## 2024-06-27 MED ORDER — OXYCODONE HCL 5 MG PO TABS: 2.5000 mg | ORAL_TABLET | Freq: Once | ORAL | Status: DC

## 2024-06-27 MED ORDER — BRIMONIDINE TARTRATE 0.2 % OP SOLN
1.0000 [drp] | Freq: Two times a day (BID) | OPHTHALMIC | Status: DC
  Administered 2024-06-27 – 2024-07-08 (×23): 1 [drp] via OPHTHALMIC
  Filled 2024-06-27: qty 5

## 2024-06-27 MED ORDER — FUROSEMIDE 10 MG/ML IJ SOLN
40.0000 mg | Freq: Once | INTRAMUSCULAR | Status: AC
  Administered 2024-06-27: 40 mg via INTRAVENOUS
  Filled 2024-06-27: qty 4

## 2024-06-27 MED ORDER — PIPERACILLIN-TAZOBACTAM 3.375 G IVPB 30 MIN
3.3750 g | Freq: Once | INTRAVENOUS | Status: AC
  Administered 2024-06-27: 3.375 g via INTRAVENOUS
  Filled 2024-06-27: qty 50

## 2024-06-27 MED ORDER — SODIUM CHLORIDE 0.9% FLUSH: 10.0000 mL | INTRAVENOUS | Status: DC | PRN

## 2024-06-27 MED ORDER — SODIUM CHLORIDE 0.9% FLUSH
10.0000 mL | Freq: Two times a day (BID) | INTRAVENOUS | Status: DC
  Administered 2024-06-27: 20 mL
  Administered 2024-06-28 – 2024-07-01 (×7): 10 mL
  Administered 2024-07-01: 20 mL
  Administered 2024-07-02: 30 mL
  Administered 2024-07-02: 20 mL
  Administered 2024-07-03 – 2024-07-07 (×6): 10 mL
  Administered 2024-07-07: 20 mL
  Administered 2024-07-08 (×2): 10 mL
  Administered 2024-07-09: 40 mL

## 2024-06-27 MED ORDER — FLUOXETINE HCL 20 MG PO CAPS
40.0000 mg | ORAL_CAPSULE | Freq: Every day | ORAL | Status: DC
  Administered 2024-06-28 – 2024-07-08 (×10): 40 mg via ORAL
  Filled 2024-06-27 (×4): qty 2

## 2024-06-27 MED ORDER — LEVOTHYROXINE SODIUM 112 MCG PO TABS
112.0000 ug | ORAL_TABLET | Freq: Every morning | ORAL | Status: DC
  Administered 2024-06-28 – 2024-07-09 (×10): 112 ug via ORAL
  Filled 2024-06-27 (×4): qty 1

## 2024-06-27 MED ORDER — ACETYLCYSTEINE LOAD VIA INFUSION
150.0000 mg/kg | Freq: Once | INTRAVENOUS | Status: AC
  Administered 2024-06-27: 13740 mg via INTRAVENOUS
  Filled 2024-06-27: qty 451

## 2024-06-27 MED ORDER — CHLORHEXIDINE GLUCONATE CLOTH 2 % EX PADS
6.0000 | MEDICATED_PAD | Freq: Every day | CUTANEOUS | Status: AC
  Administered 2024-06-27 – 2024-07-08 (×13): 6 via TOPICAL

## 2024-06-27 MED ORDER — PERFLUTREN LIPID MICROSPHERE
1.0000 mL | INTRAVENOUS | Status: AC | PRN
  Administered 2024-06-27: 3 mL via INTRAVENOUS

## 2024-06-27 MED ORDER — VANCOMYCIN VARIABLE DOSE PER UNSTABLE RENAL FUNCTION (PHARMACIST DOSING): Status: DC

## 2024-06-27 MED ORDER — DEXTROSE 5 % IV SOLN
6.2500 mg/kg/h | INTRAVENOUS | Status: AC
  Administered 2024-06-27 – 2024-06-30 (×3): 6.25 mg/kg/h via INTRAVENOUS
  Filled 2024-06-27 (×4): qty 90

## 2024-06-27 MED ORDER — VANCOMYCIN HCL IN DEXTROSE 1-5 GM/200ML-% IV SOLN: 1000.0000 mg | INTRAVENOUS | Status: DC

## 2024-06-27 MED ORDER — METHYLPREDNISOLONE SODIUM SUCC 125 MG IJ SOLR
80.0000 mg | Freq: Every day | INTRAMUSCULAR | Status: DC
  Administered 2024-06-27: 80 mg via INTRAVENOUS
  Filled 2024-06-27: qty 2

## 2024-06-27 MED ORDER — TIMOLOL MALEATE 0.5 % OP SOLN
1.0000 [drp] | Freq: Two times a day (BID) | OPHTHALMIC | Status: AC
  Administered 2024-06-27 – 2024-07-08 (×23): 1 [drp] via OPHTHALMIC
  Filled 2024-06-27: qty 5

## 2024-06-27 MED ORDER — LATANOPROST 0.005 % OP SOLN
1.0000 [drp] | Freq: Every day | OPHTHALMIC | Status: AC
  Administered 2024-06-28 – 2024-07-08 (×11): 1 [drp] via OPHTHALMIC
  Filled 2024-06-27 (×2): qty 2.5

## 2024-06-27 MED ORDER — VANCOMYCIN HCL 1500 MG/300ML IV SOLN
1500.0000 mg | Freq: Once | INTRAVENOUS | Status: AC
  Administered 2024-06-27: 1500 mg via INTRAVENOUS
  Filled 2024-06-27: qty 300

## 2024-06-27 MED ORDER — PIPERACILLIN-TAZOBACTAM 3.375 G IVPB: 3.3750 g | Freq: Three times a day (TID) | INTRAVENOUS | Status: DC

## 2024-06-27 MED ORDER — ALBUTEROL SULFATE (2.5 MG/3ML) 0.083% IN NEBU
2.5000 mg | INHALATION_SOLUTION | RESPIRATORY_TRACT | Status: DC | PRN
  Administered 2024-07-02 – 2024-07-06 (×6): 2.5 mg via RESPIRATORY_TRACT

## 2024-06-27 MED ORDER — IPRATROPIUM-ALBUTEROL 0.5-2.5 (3) MG/3ML IN SOLN
3.0000 mL | Freq: Three times a day (TID) | RESPIRATORY_TRACT | Status: DC
  Administered 2024-06-27 – 2024-06-28 (×3): 3 mL via RESPIRATORY_TRACT
  Filled 2024-06-27 (×3): qty 3

## 2024-06-27 MED ORDER — HYDROMORPHONE HCL 1 MG/ML IJ SOLN
0.2500 mg | Freq: Once | INTRAMUSCULAR | Status: AC
  Administered 2024-06-27: 0.25 mg via INTRAVENOUS
  Filled 2024-06-27: qty 1

## 2024-06-27 MED ORDER — BRIMONIDINE TARTRATE-TIMOLOL 0.2-0.5 % OP SOLN
1.0000 [drp] | Freq: Two times a day (BID) | OPHTHALMIC | Status: DC
  Filled 2024-06-27: qty 5

## 2024-06-27 NOTE — Progress Notes (Signed)
 At bedside to discuss PICC line with patient.  Patient is currently on BiPAP and very difficult to understand.  Spoke with Kiana, RN about the possibility of removing the BiPAP long enough to allow patient to ask questions.  Dania states that patient is not stable respiratory wise to do this.  Patient has expressed that she does not want central line therefore it is important for her to be able to ask questions and understand regarding the PICC being a central line.  Dania, RN to notify team when patient is able to have mask removed briefly for this discussion.  She states the bedside staff have also been experiencing difficulties understanding patient.  Bedside staff have had patient write out her needs with some difficulties as well.

## 2024-06-27 NOTE — Progress Notes (Signed)
 " PROGRESS NOTE    Lauren Clark  FMW:[id number] DOB: [birth year] DOA: 06/26/2024 PCP: [name], MD  Subjective: No acute events since admission. Informed by nursing for low BP with systolics in 70s on repeat manual checks. Patient seen and examined at bedside. Reports still feeling short of breath and fatigued. Cannot provide a lot of history as currently on BiPAP due to acuity of condition.   Hospital Course:  As per H&P: 68 y.o. female with medical history significant of COPD, chronic hypoxemic respiratory failure on 2 L home oxygen, CAD, A-fib on Eliquis , HFpEF, ascending aortic aneurysm followed by cardiothoracic surgery, hypertension, hyperlipidemia, hypothyroidism, CKD stage IIIa, anemia, anxiety, depression, GERD, glaucoma, vision impairment, morbid obesity status post gastric sleeve surgery, PVD.  Recent hospital admission 12/13-12/24 for AKI, pyelonephritis, ileus, mildly elevated LFTs, and acute on chronic hypoxemic respiratory failure secondary to CHF exacerbation. Creatinine had peaked to 3.86.  Her diuretics were held and she was given IV albumin .  Creatinine improved to 2.97 at the time of discharge yesterday and nephrology resumed Lasix ..  Completed 7 days of Rocephin  for pyelonephritis.  She was evaluated by physical therapy and discharged with home health PT/OT yesterday afternoon.  Patient returned to the ED via EMS in the evening due to concern for shortness of breath, fall at home, and possible syncope.  She was saturating in the 80s on her home 2 L [place] with EMS.  She was wheezing on exam and given given albuterol  treatment by EMS.  Patient noted to have bruising on her left foot and complained of knee pain as well.  In the ED, patient was initially saturating well on 4.5 L [place] but after RN laid her down in the bed to place bedpan, patient desaturated to the mid 80s and oxygen saturation did not improve with nonrebreather.  She became tachypneic to the 30s and was subsequently  placed on BiPAP with improvement of work of breathing and improvement of oxygen saturation to the 90s.  Afebrile.  Labs showing worsening leukocytosis (WBC count now 35.1), sodium 131, bicarb 17, anion gap 16, glucose 160, BUN 56, creatinine 3.0, AST 586, ALT 276, alk phos 203, T. bili 1.2, lactic acid 4.1>3.3, CK 385, proBNP 8463, influenza A PCR positive, no blood cultures drawn.  UA showing large amount of leukocytes, 21-50 RBCs, 21-50 WBCs, and many bacteria EKG showing sinus or ectopic atrial rhythm with borderline PR prolongation, QTc 531, and no acute ischemic changes.  Troponin 157.  X-rays of pelvis, bilateral knees, and left foot negative for fractures.  Chest x-ray showing no acute findings or evidence of traumatic injury.  CT head/C-spine negative for acute findings.  Right upper quadrant abdominal ultrasound showing gallstones without sonographic findings of acute cholecystitis.  Showing increased hepatic parenchymal echogenicity typical of steatosis.  Patient was given Solu-Medrol  125 mg, vancomycin , Zosyn , IV mag 2 g, and continuous albuterol  neb treatment in the ED.   History limited as patient is currently on BiPAP.  She reports worsening dyspnea since after leaving the hospital yesterday.  Reports having a fall at home and possible loss of consciousness but she is not sure.  Reports some chest discomfort only when coughing but no chest pain or pressure otherwise.  Reports history of postprandial right upper quadrant abdominal pain for several months.  Denies vomiting.  She is chronically on 2 L home oxygen.   Assessment and Plan:  Acute on chronic hypoxemic respiratory failure Patient was noted to be saturating in the  80s on her home 2 L [place] with EMS.  She was wheezing with EMS and was given albuterol  treatment. - currently on BiPAP, on 2L via [place] at home - etiology unclear, likely multifactorial in the setting of acute COPD exacerbation, acute influenza A infection with superimposed  bacterial PNA, acute CHF exacerbation - less likely acute PE as already on anticoagulation  - Blood gas without evidence of hypercapnia.   - see management of active co-morbidities as elsewhere - cont BiPAP for now - wean off BiPAP as tolerated  Acute COPD exacerbation - status post albuterol  nebs, Solu-Medrol  125 mg, and IV mag 2 g in the ED.  - cont IV solumedrol while on BiPAP, consider transition to PO prednisone  when able to take PO - cont duonebs ATC - cont albuterol  PRN  Sepsis Acute influenza A infection Possible bacterial PNA Possible - Recently finished 7-day course of Rocephin  for pyelonephritis. Ucx with polymicrobial growth at that time - UA continues to show evidence of pyuria and bacteria.  - Influenza A PCR positive.  - CXR with no any acute findings but stable cardiomegaly - CT chest w/o contrast showed interval development of patchy ground-glass opacity with interlobular septal thickening in both upper lobes, L > R. Confluent areas of consolidative airspace disease are seen in a patchy distribution of the mid and lower lungs bilaterally. Imaging features are compatible with multifocal pneumonia. - cont oseltamivir  - cont vancomycin  and zosyn  - follow blood and urine culture data - CT abdomen pelvis w/o contrast ordered and pending  Possible acute on chronic HFpEF Hypotension - clinically fluid overloaded with bilateral LE edema - CXR with no any acute findings but stable cardiomegaly - proBNP 32843 < 8463 - TTE showed apical akinesis concerning for stress induced cardiomyopathy but cannot exclude LAD distribution ischemia, LVEF 30-35%, RV size/systolic function normal, IVC normal size with <50% variability suggesting RAP - given lasix  40mg  IV once in the ED - Hold off of further diuresis for today - avoid IV fluids for now given currently on BiPAP and is DNR/DNI code status - plan to get PICC line to monitor COOX and CVP as per cardiology recs - seen by  PCCM. Patient wants to avoid procedures including central line placement and wants to try and avoid ICU level of care.  - cont external foley for accurate I/Os - daily weights - monitor on tele - cardiology following - palliative following   Elevated troponin NSTEMI History of CAD - EKG without acute ischemic changes - Trop 365 < 323 < 157 - TTE with apical akinesis - hold home eliquis   - start heparin  drip - hold statin given acute liver failiure - hold metoprolol  given hypotension - monitor on tele - cardiology following  Acute liver failure  - Patient is reporting history of postprandial abdominal pain for several months - Transaminases and alkaline phosphatase worse compared to labs done during recent hospitalization. AST 586, ALT 276, alk phos 203.   - etiology unclear, likely shock liver in the setting of hypotension due to possible shock state (septic vs cardiogenic) - Right upper quadrant abdominal ultrasound showed gallstones without sonographic findings of acute cholecystitis.  Showing increased hepatic parenchymal echogenicity typical of steatosis.   - Acute hepatitis panel negative  - hold home statin therapy - start NAC protocol - add on acetaminophen  level to labs - monitor LFTs - She will need outpatient evaluation by general surgery for consideration of cholecystectomy after her acute pulmonary and cardiac issues resolve.  AKI on CKD stage IIIa High anion gap metabolic acidosis - Creatinine 3.36 < 3.2, 2.97 on discharge 06/26/24, baseline before last admission was around 1.2.  - concern for fluid overload state - status post lasix  IV dose in the ED - holding off on further diuresis as per cardio recs - avoid nephrotoxic agents - renal dose medications - monitor I/Os, renal function  - discussed with nephrology who will continue to follow   Lactic acidosis - likely in the setting of hypotension due to possible shock state (septic vs cardiogenic), NSTEMI  and acute liver failure - resolved now - monitor clinically   Paroxysmal A-fib - Start heparin  drip - hold home eliquis  - cardiology following   Mild chronic hyponatremia - asymptomatic - Stable since recent hospitalization - likely due to hypervolemia.  - status post one dose IV Lasix  in ED - hold IV diuresis for today as per cardio recs - monitor BMP   QT prolongation QTc 531 on EKG.   - Monitor potassium and magnesium  levels.  -  Avoid QT prolonging drugs if possible.  -  Follow-up repeat EKG.   Hyperlipidemia - hold statin therapy given acute liver failure  Hypothyroidism - start levothyroxine , can get IV form if remains unable to take PO for a couple of days  Anxiety and depression - cont home fluoxetine   Mechanical Fall - No traumatic injuries identified on imaging of chest, pelvis, bilateral knees, left foot, head, and C-spine.  Fall precautions.  - Will need PT/OT eval once her respiratory status improves.  DVT prophylaxis:   IV Heparin    Code Status: Limited: Do not attempt resuscitation (DNR) -DNR-LIMITED -Do Not Intubate/DNI   Disposition Plan: TBD pending clinical course Reason for continuing need for hospitalization: severity of illness  Objective: Vitals:   06/27/24 1230 06/27/24 1300 06/27/24 1305 06/27/24 1452  BP: 117/61 (!) 159/108 (!) 148/67 106/81  Pulse: 66 90 89 63  Resp: (!) 27 (!) 28 (!) 22 20  Temp:    97.7 F (36.5 C)  TempSrc:    Axillary  SpO2: 93% (!) 83% 94% 100%  Weight:      Height:        Intake/Output Summary (Last 24 hours) at 06/27/2024 1501 Last data filed at 06/27/2024 1416 Gross per 24 hour  Intake --  Output 325 ml  Net -325 ml   Filed Weights   06/26/24 2317  Weight: 91.6 kg    Examination:  Physical Exam Vitals and nursing note reviewed.  Constitutional:      General: She is not in acute distress.    Appearance: She is obese. She is ill-appearing.  HENT:     Head: Normocephalic and atraumatic.   Cardiovascular:     Rate and Rhythm: Normal rate and regular rhythm.     Pulses: Normal pulses.     Heart sounds: Normal heart sounds.  Pulmonary:     Effort: Respiratory distress present.     Breath sounds: Normal breath sounds.     Comments: Coarse breath sounds bilaterally Abdominal:     General: Bowel sounds are normal.     Palpations: Abdomen is soft.  Musculoskeletal:     Right lower leg: Edema (2+ pitting) present.     Left lower leg: Edema (2+ pitting) present.  Neurological:     Mental Status: She is alert. Mental status is at baseline.     Data Reviewed: I have personally reviewed following labs and imaging studies  CBC: Recent Labs  Lab 06/23/24 0225 06/25/24 0209 06/26/24 2323 06/26/24 2327 06/27/24 0443 06/27/24 0705  WBC 21.3* 18.9* 35.1*  --   --  26.5*  NEUTROABS  --  15.7*  --   --   --   --   HGB 11.8* 11.2* 12.2 14.3 12.6 11.6*  HCT 36.9 34.4* 38.6 42.0 37.0 36.7  MCV 91.1 90.8 94.1  --   --  94.3  PLT 236 248 313  --   --  241   Basic Metabolic Panel: Recent Labs  Lab 06/23/24 0225 06/24/24 0740 06/25/24 0209 06/26/24 0325 06/26/24 2323 06/26/24 2327 06/27/24 0443 06/27/24 0705 06/27/24 1138  NA 131* 131* 132* 133* 131* 133* 132* 132*  --   K 3.7 4.4 3.8 4.1 4.7 4.6 4.2 4.7  --   CL 97* 98 98 99 99 101  --  98  --   CO2 23 21* 22 24 17*  --   --  19*  --   GLUCOSE 132* 128* 112* 96 160* 160*  --  170*  --   BUN 62* 65* 69* 59* 56* 65*  --  59*  --   CREATININE 3.58* 3.39* 3.20* 2.97* 3.08* 3.20*  --  3.36*  --   CALCIUM  8.3* 8.6* 8.4* 8.6* 8.8*  --   --  8.7*  --   MG 2.4  --   --   --   --   --   --  3.2* 3.1*  PHOS  --  4.1  --   --   --   --   --   --  5.7*   GFR: Estimated Creatinine Clearance: 17.7 mL/min (A) (by C-G formula based on SCr of 3.36 mg/dL (H)). Liver Function Tests: Recent Labs  Lab 06/23/24 0225 06/24/24 0740 06/25/24 0209 06/26/24 0325 06/26/24 2323 06/27/24 0705  AST 90*  --  89* 101* 586* 5,581*  ALT  87*  --  90* 95* 276* 2,076*  ALKPHOS 149*  --  114 108 203* 203*  BILITOT 0.4  --  0.4 0.6 1.2 1.2  PROT 5.8*  --  6.2* 6.3* 7.0 6.4*  ALBUMIN  2.3* 2.5* 2.8* 3.3* 3.4* 3.2*   No results for input(s): LIPASE, AMYLASE in the last 168 hours. No results for input(s): AMMONIA in the last 168 hours. Coagulation Profile: Recent Labs  Lab 06/26/24 2323  INR 2.4*   Cardiac Enzymes: Recent Labs  Lab 06/27/24 0002 06/27/24 0705  CKTOTAL 385* 894*   ProBNP, BNP (last 5 results) Recent Labs    01/01/24 0851 04/15/24 1032 06/17/24 0135 06/27/24 0002 06/27/24 0705  PROBNP  --   --   --  8,463.0* 32,843.0*  BNP 311.3* 179.8* 567.8*  --   --    HbA1C: No results for input(s): HGBA1C in the last 72 hours. CBG: Recent Labs  Lab 06/26/24 0425  GLUCAP 97   Lipid Profile: No results for input(s): CHOL, HDL, LDLCALC, TRIG, CHOLHDL, LDLDIRECT in the last 72 hours. Thyroid  Function Tests: No results for input(s): TSH, T4TOTAL, FREET4, T3FREE, THYROIDAB in the last 72 hours. Anemia Panel: No results for input(s): VITAMINB12, FOLATE, FERRITIN, TIBC, IRON, RETICCTPCT in the last 72 hours. Sepsis Labs: Recent Labs  Lab 06/26/24 2328 06/27/24 0413 06/27/24 0705 06/27/24 1138  PROCALCITON  --   --   --  16.00  LATICACIDVEN 4.1* 3.3* 1.8  --     Recent Results (from the past 240 hours)  Resp panel by RT-PCR (RSV, Flu A&B, Covid)  Anterior Nasal Swab     Status: Abnormal   Collection Time: 06/27/24 12:05 AM   Specimen: Anterior Nasal Swab  Result Value Ref Range Status   SARS Coronavirus 2 by RT PCR NEGATIVE NEGATIVE Final   Influenza A by PCR POSITIVE (A) NEGATIVE Final   Influenza B by PCR NEGATIVE NEGATIVE Final    Comment: (NOTE) The Xpert Xpress SARS-CoV-2/FLU/RSV plus assay is intended as an aid in the diagnosis of influenza from Nasopharyngeal swab specimens and should not be used as a sole basis for treatment. Nasal washings  and aspirates are unacceptable for Xpert Xpress SARS-CoV-2/FLU/RSV testing.  Fact Sheet for Patients: bloggercourse.com  Fact Sheet for Healthcare Providers: seriousbroker.it  This test is not yet approved or cleared by the [place]  FDA and has been authorized for detection and/or diagnosis of SARS-CoV-2 by FDA under an Emergency Use Authorization (EUA). This EUA will remain in effect (meaning this test can be used) for the duration of the COVID-19 declaration under Section 564(b)(1) of the Act, 21 U.S.C. section 360bbb-3(b)(1), unless the authorization is terminated or revoked.     Resp Syncytial Virus by PCR NEGATIVE NEGATIVE Final    Comment: (NOTE) Fact Sheet for Patients: bloggercourse.com  Fact Sheet for Healthcare Providers: seriousbroker.it  This test is not yet approved or cleared by the [place]  FDA and has been authorized for detection and/or diagnosis of SARS-CoV-2 by FDA under an Emergency Use Authorization (EUA). This EUA will remain in effect (meaning this test can be used) for the duration of the COVID-19 declaration under Section 564(b)(1) of the Act, 21 U.S.C. section 360bbb-3(b)(1), unless the authorization is terminated or revoked.  Performed at [facility], [address] [place], [place] [address]   Culture, blood (Routine X 2) w Reflex to ID Panel     Status: None (Preliminary result)   Collection Time: 06/27/24  6:57 AM   Specimen: BLOOD  Result Value Ref Range Status   Specimen Description BLOOD BLOOD LEFT HAND  Final   Special Requests   Final    BOTTLES DRAWN AEROBIC ONLY Blood Culture results may not be optimal due to an inadequate volume of blood received in culture bottles   Culture   Final    NO GROWTH < 12 HOURS Performed at [facility], [address], [place], [place] [address]    Report Status PENDING  Incomplete   Culture, blood (Routine X 2) w Reflex to ID Panel     Status: None (Preliminary result)   Collection Time: 06/27/24  7:45 AM   Specimen: BLOOD  Result Value Ref Range Status   Specimen Description BLOOD RIGHT ANTECUBITAL  Final   Special Requests   Final    BOTTLES DRAWN AEROBIC AND ANAEROBIC Blood Culture adequate volume   Culture   Final    NO GROWTH < 12 HOURS Performed at [facility], [address], [place], [place] [address]    Report Status PENDING  Incomplete  MRSA Next Gen by PCR, Nasal     Status: None   Collection Time: 06/27/24 11:38 AM   Specimen: Nasal Mucosa; Nasal Swab  Result Value Ref Range Status   MRSA by PCR Next Gen NOT DETECTED NOT DETECTED Final    Comment: (NOTE) The GeneXpert MRSA Assay (FDA approved for NASAL specimens only), is one component of a comprehensive MRSA colonization surveillance program. It is not intended to diagnose MRSA infection nor to guide or monitor treatment for MRSA  infections. Test performance is not FDA approved in patients less than 29 years old. Performed at [facility], [address] [place], [place] [address]      Radiology Studies: US  EKG SITE RITE Result Date: 06/27/2024 If Site Rite image not attached, placement could not be confirmed due to current cardiac rhythm.  ECHOCARDIOGRAM COMPLETE Result Date: 06/27/2024    ECHOCARDIOGRAM REPORT   Patient Name:   Lauren Clark Date of Exam: 06/27/2024 Medical Rec #:  [id number]    Height:       65.0 in Accession #:    [id number]   Weight:       202.0 lb Date of Birth:  08/30/1955    BSA:          1.987 m Patient Age:    68 years     BP:           105/52 mmHg Patient Gender: F            HR:           56 bpm. Exam Location:  Inpatient Procedure: 2D Echo, Cardiac Doppler, Color Doppler and Intracardiac            Opacification Agent (Both Spectral and Color Flow Doppler were            utilized during procedure). STAT ECHO Indications:    Dyspnea  History:         Patient has prior history of Echocardiogram examinations, most                 recent 05/20/2024. CHF, COPD; Risk Factors:Hypertension and                 Dyslipidemia.  Sonographer:    Philomena Daring Referring Phys: JONATHAN F BRANCH  Sonographer Comments: Patient is obese. IMPRESSIONS  1. Apical akinesis, pattern could suggest stress induced cardiomyopathy. Cannot exlcude possible LAD ischemia. . Left ventricular ejection fraction, by estimation, is 30 to 35%. The left ventricle has moderately decreased function. The left ventricle demonstrates regional wall motion abnormalities (see scoring diagram/findings for description). There is moderate left ventricular hypertrophy. Left ventricular diastolic parameters are consistent with Grade II diastolic dysfunction (pseudonormalization). Elevated left atrial pressure.  2. Right ventricular systolic function is normal. The right ventricular size is normal.  3. The mitral valve is abnormal. Trivial mitral valve regurgitation. No evidence of mitral stenosis.  4. The aortic valve was not well visualized. There is mild calcification of the aortic valve. There is mild thickening of the aortic valve. Aortic valve regurgitation is moderate.  5. The inferior vena cava is normal in size with <50% respiratory variability, suggesting right atrial pressure of 8 mmHg. FINDINGS  Left Ventricle: Apical akinesis, pattern could suggest stress induced cardiomyopathy. Cannot exlcude possible LAD ischemia. Left ventricular ejection fraction, by estimation, is 30 to 35%. The left ventricle has moderately decreased function. The left ventricle demonstrates regional wall motion abnormalities. Definity  contrast agent was given IV to delineate the left ventricular endocardial borders. The left ventricular internal cavity size was normal in size. There is moderate left ventricular hypertrophy. Left ventricular diastolic parameters are consistent with Grade II diastolic dysfunction  (pseudonormalization). Elevated left atrial pressure.  LV Wall Scoring: The mid and distal anterior septum, apical lateral segment, apical anterior segment, and apex are akinetic. Right Ventricle: The right ventricular size is normal. Right vetricular wall thickness was not well visualized. Right ventricular systolic function is normal. Left Atrium: Left atrial  size was not well visualized. Right Atrium: Right atrial size was not well visualized. Pericardium: There is no evidence of pericardial effusion. Mitral Valve: The mitral valve is abnormal. Trivial mitral valve regurgitation. No evidence of mitral valve stenosis. Tricuspid Valve: The tricuspid valve is not well visualized. Tricuspid valve regurgitation is trivial. No evidence of tricuspid stenosis. Aortic Valve: The aortic valve was not well visualized. There is mild calcification of the aortic valve. There is mild thickening of the aortic valve. There is mild aortic valve annular calcification. Aortic valve regurgitation is moderate. Aortic valve mean gradient measures 4.1 mmHg. Aortic valve peak gradient measures 10.4 mmHg. Aortic valve area, by VTI measures 2.79 cm. Pulmonic Valve: The pulmonic valve was not well visualized. Pulmonic valve regurgitation is not visualized. No evidence of pulmonic stenosis. Aorta: The aortic root was not well visualized. Venous: The inferior vena cava is normal in size with less than 50% respiratory variability, suggesting right atrial pressure of 8 mmHg. IAS/Shunts: No atrial level shunt detected by color flow Doppler.  LEFT VENTRICLE PLAX 2D LVIDd:         5.10 cm   Diastology LVIDs:         3.60 cm   LV e' medial:    5.33 cm/s LV PW:         1.20 cm   LV E/e' medial:  16.2 LV IVS:        1.30 cm   LV e' lateral:   7.83 cm/s LVOT diam:     1.90 cm   LV E/e' lateral: 11.0 LV SV:         86 LV SV Index:   43 LVOT Area:     2.84 cm  RIGHT VENTRICLE             IVC RV S prime:     16.50 cm/s  IVC diam: 2.10 cm TAPSE  (M-mode): 2.2 cm LEFT ATRIUM             Index        RIGHT ATRIUM           Index LA diam:        4.10 cm 2.06 cm/m   RA Area:     16.20 cm LA Vol (A2C):   46.7 ml 23.51 ml/m  RA Volume:   35.50 ml  17.87 ml/m LA Vol (A4C):   52.6 ml 26.48 ml/m LA Biplane Vol: 51.0 ml 25.67 ml/m  AORTIC VALVE AV Area (Vmax):    2.27 cm AV Area (Vmean):   2.68 cm AV Area (VTI):     2.79 cm AV Vmax:           161.13 cm/s AV Vmean:          90.612 cm/s AV VTI:            0.309 m AV Peak Grad:      10.4 mmHg AV Mean Grad:      4.1 mmHg LVOT Vmax:         129.00 cm/s LVOT Vmean:        85.600 cm/s LVOT VTI:          0.304 m LVOT/AV VTI ratio: 0.98 MITRAL VALVE MV Area (PHT): 4.57 cm    SHUNTS MV Decel Time: 166 msec    Systemic VTI:  0.30 m MV E velocity: 86.20 cm/s  Systemic Diam: 1.90 cm MV A velocity: 59.80 cm/s MV E/A ratio:  1.44 Dorn Ross MD Electronically  signed by Dorn Ross MD Signature Date/Time: 06/27/2024/9:19:25 AM    Final    CT CHEST WO CONTRAST Result Date: 06/27/2024 CLINICAL DATA:  Respiratory illness. EXAM: CT CHEST WITHOUT CONTRAST TECHNIQUE: Multidetector CT imaging of the chest was performed following the standard protocol without IV contrast. RADIATION DOSE REDUCTION: This exam was performed according to the departmental dose-optimization program which includes automated exposure control, adjustment of the mA and/or kV according to patient size and/or use of iterative reconstruction technique. COMPARISON:  Chest x-ray 1 day prior.  Chest CT 04/22/2024 FINDINGS: Cardiovascular: The heart is enlarged. No substantial pericardial effusion. Coronary artery calcification is evident. Mild atherosclerotic calcification is noted in the wall of the thoracic aorta. Ascending thoracic aorta measures 4.1 cm diameter compatible with aneurysm. Mediastinum/Nodes: No mediastinal lymphadenopathy. No evidence for gross hilar lymphadenopathy although assessment is limited by the lack of intravenous contrast  on the current study. The esophagus has normal imaging features. There is no axillary lymphadenopathy. Lungs/Pleura: Interval development of patchy ground-glass opacity with interlobular septal thickening in both upper lobes, left greater than right. Confluent areas of consolidative airspace disease are seen in a patchy distribution of the mid and lower lungs bilaterally. Trace left pleural effusion evident. Upper Abdomen: Surgical changes noted in the stomach. Musculoskeletal: No worrisome lytic or sclerotic osseous abnormality. IMPRESSION: 1. Interval development of patchy ground-glass opacity with interlobular septal thickening in both upper lobes, left greater than right. Confluent areas of consolidative airspace disease are seen in a patchy distribution of the mid and lower lungs bilaterally. Imaging features are compatible with multifocal pneumonia. 2. Trace left pleural effusion. 3.  Aortic Atherosclerosis (ICD10-I70.0). Electronically Signed   By: Camellia Candle M.D.   On: 06/27/2024 06:38   US  Abdomen Limited RUQ (LIVER/GB) Result Date: 06/27/2024 CLINICAL DATA:  Epigastric pain. EXAM: ULTRASOUND ABDOMEN LIMITED RIGHT UPPER QUADRANT COMPARISON:  Ultrasound 1 week ago 06/20/2024 FINDINGS: Gallbladder: Physiologically distended. No gallbladder wall thickening. Small gallstones. No pericholecystic fluid. No sonographic Murphy sign noted by sonographer. Common bile duct: Diameter: 4 mm, normal Liver: Diffusely increased parenchymal echogenicity. The echogenic lesion on prior exam was not delineated on the current exam. Technically limited due to difficulty with patient positioning. Portal vein is patent on color Doppler imaging with normal direction of blood flow towards the liver. Other: No right upper quadrant ascites. IMPRESSION: 1. Gallstones without sonographic findings of acute cholecystitis. 2. Increased hepatic parenchymal echogenicity typical of steatosis. 3. Previous echogenic right lobe lesion is  not seen on the current exam. Electronically Signed   By: Andrea Gasman M.D.   On: 06/27/2024 01:22   CT Head Wo Contrast Result Date: 06/27/2024 EXAM: CT HEAD AND CERVICAL SPINE 06/26/2024 11:53:00 PM TECHNIQUE: CT of the head and cervical spine was performed without the administration of intravenous contrast. Multiplanar reformatted images are provided for review. Automated exposure control, iterative reconstruction, and/or weight based adjustment of the mA/kV was utilized to reduce the radiation dose to as low as reasonably achievable. COMPARISON: None available. CLINICAL HISTORY: Head trauma, minor (Age >= 65y) FINDINGS: CT HEAD BRAIN AND VENTRICLES: No acute intracranial hemorrhage. No mass effect or midline shift. No abnormal extra-axial fluid collection. No evidence of acute infarct. No hydrocephalus. A 1.1 cm calcified extra-axial dural based mass along the anterior falx is unchanged. ORBITS: No acute abnormality. SINUSES AND MASTOIDS: No acute abnormality. SOFT TISSUES AND SKULL: No acute skull fracture. No acute soft tissue abnormality. CT CERVICAL SPINE BONES AND ALIGNMENT: No acute fracture or traumatic malalignment.  DEGENERATIVE CHANGES: No significant degenerative changes. SOFT TISSUES: No prevertebral soft tissue swelling. IMPRESSION: 1. No acute intracranial abnormality. 2. No acute fracture or traumatic malalignment of the cervical spine. 3. Unchanged small putative left parfalcine meningioma. Electronically signed by: Gilmore Molt 06/27/2024 12:03 AM EST RP Workstation: [id number]   CT Cervical Spine Wo Contrast Result Date: 06/27/2024 EXAM: CT HEAD AND CERVICAL SPINE 06/26/2024 11:53:00 PM TECHNIQUE: CT of the head and cervical spine was performed without the administration of intravenous contrast. Multiplanar reformatted images are provided for review. Automated exposure control, iterative reconstruction, and/or weight based adjustment of the mA/kV was utilized to reduce the  radiation dose to as low as reasonably achievable. COMPARISON: None available. CLINICAL HISTORY: Head trauma, minor (Age >= 65y) FINDINGS: CT HEAD BRAIN AND VENTRICLES: No acute intracranial hemorrhage. No mass effect or midline shift. No abnormal extra-axial fluid collection. No evidence of acute infarct. No hydrocephalus. A 1.1 cm calcified extra-axial dural based mass along the anterior falx is unchanged. ORBITS: No acute abnormality. SINUSES AND MASTOIDS: No acute abnormality. SOFT TISSUES AND SKULL: No acute skull fracture. No acute soft tissue abnormality. CT CERVICAL SPINE BONES AND ALIGNMENT: No acute fracture or traumatic malalignment. DEGENERATIVE CHANGES: No significant degenerative changes. SOFT TISSUES: No prevertebral soft tissue swelling. IMPRESSION: 1. No acute intracranial abnormality. 2. No acute fracture or traumatic malalignment of the cervical spine. 3. Unchanged small putative left parfalcine meningioma. Electronically signed by: Gilmore Molt 06/27/2024 12:03 AM EST RP Workstation: [id number]   DG Knee Complete 4 Views Left Result Date: 06/26/2024 CLINICAL DATA:  Trauma, fall. EXAM: LEFT KNEE - COMPLETE 4+ VIEW COMPARISON:  06/06/2022 FINDINGS: Knee arthroplasty in expected alignment. Prior patellar resurfacing. No acute or periprosthetic fracture. No periprosthetic lucency. No significant joint effusion. Mild generalized soft tissue edema. IMPRESSION: Knee arthroplasty without complication or acute fracture. Electronically Signed   By: Andrea Gasman M.D.   On: 06/26/2024 23:56   DG Foot Complete Left Result Date: 06/26/2024 CLINICAL DATA:  Trauma, fall. EXAM: LEFT FOOT - COMPLETE 3+ VIEW COMPARISON:  None Available. FINDINGS: There is no evidence of fracture or dislocation. Minor talonavicular degenerative change. Dorsal soft tissue edema. IMPRESSION: Dorsal soft tissue edema. No fracture or subluxation. Electronically Signed   By: Andrea Gasman M.D.   On: 06/26/2024 23:55    DG Knee Complete 4 Views Right Result Date: 06/26/2024 CLINICAL DATA:  Trauma, fall. EXAM: RIGHT KNEE - COMPLETE 4+ VIEW COMPARISON:  11/15/2022 FINDINGS: Knee arthroplasty in expected alignment. Prior patellar resurfacing. No acute or periprosthetic fracture. Minimal joint effusion. Mild generalized soft tissue edema. IMPRESSION: Knee arthroplasty without complication or acute fracture. Electronically Signed   By: Andrea Gasman M.D.   On: 06/26/2024 23:54   DG Chest Port 1 View Result Date: 06/26/2024 CLINICAL DATA:  Trauma, fall. EXAM: PORTABLE CHEST 1 VIEW COMPARISON:  06/17/2024 FINDINGS: Cardiomegaly is stable. Mediastinal contours are unchanged. Stable interstitial coarsening. Subsegmental atelectasis/scarring in the right mid lung. No pneumothorax. No large pleural effusion. On limited assessment, no acute osseous findings. Bilateral shoulder arthropathy. IMPRESSION: 1. No acute findings or evidence of traumatic injury. 2. Stable cardiomegaly. Electronically Signed   By: Andrea Gasman M.D.   On: 06/26/2024 23:53   DG Pelvis Portable Result Date: 06/26/2024 CLINICAL DATA:  Trauma, fall. EXAM: PORTABLE PELVIS 1-2 VIEWS COMPARISON:  06/16/2024 FINDINGS: Right hip arthroplasty in expected alignment. No acute or periprosthetic fracture. Pubic rami are intact. No pubic symphyseal or sacroiliac diastasis. Left hip osteoarthritis. IMPRESSION: Right hip arthroplasty without complication  or acute fracture. Electronically Signed   By: Andrea Gasman M.D.   On: 06/26/2024 23:52    Scheduled Meds:  ipratropium-albuterol   3 mL Nebulization Q8H   methylPREDNISolone  (SOLU-MEDROL ) injection  80 mg Intravenous Daily   oseltamivir   30 mg Oral Once   vancomycin  variable dose per unstable renal function (pharmacist dosing)   Does not apply See admin instructions   Continuous Infusions:  acetylcysteine  12.5 mg/kg/hr (06/27/24 1228)   Followed by   acetylcysteine      heparin  950 Units/hr (06/27/24  0804)   piperacillin -tazobactam (ZOSYN )  IV       LOS: 0 days   Norval Bar, MD  [facility]  06/27/2024, 3:01 PM   "

## 2024-06-27 NOTE — ED Notes (Signed)
 Pt desat when laid back to be placed on bedpan, pt initially 91% on 4.5 L [place], remained low 80's on 7L, 86-88% on NRB. PA and RT to bedside and pt placed on bipap.

## 2024-06-27 NOTE — Progress Notes (Signed)
 PHARMACY - ANTICOAGULATION CONSULT NOTE  Pharmacy Consult for IV heparin  Indication: chest pain/ACS  Allergies[1]  Patient Measurements: Height: 5' 5 (165.1 cm) Weight: 91.6 kg (202 lb) IBW/kg (Calculated) : 57 HEPARIN  DW (KG): 77.4  Vital Signs: Temp: 98.2 F (36.8 C) (12/25 0635) Temp Source: Axillary (12/25 0635) BP: 81/46 (12/25 0645) Pulse Rate: 52 (12/25 0645)  Labs: Recent Labs    06/25/24 0209 06/26/24 0325 06/26/24 2323 06/26/24 2327 06/27/24 0002 06/27/24 0443  HGB 11.2*  --  12.2 14.3  --  12.6  HCT 34.4*  --  38.6 42.0  --  37.0  PLT 248  --  313  --   --   --   LABPROT  --   --  27.4*  --   --   --   INR  --   --  2.4*  --   --   --   CREATININE 3.20* 2.97* 3.08* 3.20*  --   --   CKTOTAL  --   --   --   --  385*  --     Estimated Creatinine Clearance: 18.5 mL/min (A) (by C-G formula based on SCr of 3.2 mg/dL (H)).   Medical History: Past Medical History:  Diagnosis Date   Anemia    s/p knee surgery   Anxiety    Aortic dissection (HCC)    Arthritis    COPD (chronic obstructive pulmonary disease) (HCC) 06/15/2024   Depression    GERD (gastroesophageal reflux disease)    Glaucoma    right eye uses drops   History of blood transfusion    [facility]   History of MRSA infection    Hyperlipidemia    Hypertension    Hypothyroidism    Impaired vision in both eyes    blind left eye, limited vision right eye   Morbid obesity (HCC)    s/p Gastric sleeve- 8'17(loss thus far approx. 100 lbs)   Peripheral vascular disease    dvt right leg   Pre-diabetes    prior to gastric sleeve    Assessment: JARIKA ROBBEN is a 68 y.o. year old female admitted on 06/26/2024 with concern for ACS. Eliquis  prior to admission for afib (LD 12/24 AM per patient). Of note, pt currently on BiPAP unable to take oral meds at this time. Pharmacy consulted to dose heparin . With presence of DOAC pta, will trend aPTT until correlates with anti-xa  (heparin  level).  Goal of Therapy:  Heparin  level 0.3-0.7 units/ml aPTT 66-102 seconds Monitor platelets by anticoagulation protocol: Yes   Plan:  Heparin  infusion at 950 units/hr 8h aPTT  Daily aPTT, heparin  level, CBC, and monitoring for bleeding F/u plans for anticoagulation   Thank you for allowing pharmacy to participate in this patient's care.  Leonor GORMAN Bash, PharmD Emergency Medicine Clinical Pharmacist 06/27/2024,7:26 AM       [1]  Allergies Allergen Reactions   Crestor  [Rosuvastatin  Calcium ] Other (See Comments)    Muscle aches     Lipitor [Atorvastatin] Other (See Comments)    Muscle aches    Hctz [Hydrochlorothiazide] Other (See Comments)    Pt doesn't remember

## 2024-06-27 NOTE — Progress Notes (Incomplete)
 Attending Note  Patient seen and discussed with PA Janene, I agree with his documentation. 68 yo female history of persistent afib, chronic HFpEF, aortic aneurysm with dissection not surgical candidate with plans for palliative referral, chronic respiratory failure/COPD on home O2 2L, admitted with SOB Just discharged 12/24. Managed for AKI on CKD, pyelonephritis.     K 4.7 BUN 56 Cr 3.08 WBC 35.1 Hgbn 12.2 Plt 313 INR 2.4 Lactic acid 4.1-->3.3 -->1.8 Pro BNP 8400 -->32,000 Trop 157 -->323 -->365 ABG 01/03/34/81/18 Flu A + EKG SR, no acute ischemic changes CXR: no acute process CT chest: Interval development of patchy ground-glass opacity with interlobular septal thickening in both upper lobes, left greater than right. Confluent areas of consolidative airspace disease are seen in a patchy distribution of the mid and lower lungs bilaterally. Imaging features are compatible with multifocal pneumonia.   06/2024 echo: LVEF 35%, apical akinesis    1.Shock WBC 35.1, lactic acid 4.1 Procalc pending - likely septic shock secondary to pneumonia, she is influenza +. - echo LVEF 35%, apical akinesis. May be a cardiac component to her shock. Have recommend central line placement to obtain cooximetry. CVP would also be beneficial to determine degree volume status may be playing a role in her hypoxia vs influenza/multifocal pneumonia alone.  -MAPs borderline, has not required pressors as of yest.     2.Acute HFrEF - 05/2024 echo: LVEF 55-60% -06/2024 echo: LVEF 35%, apical akinesis  - most likely stress induced CM/Takotsubo CM. Cannot exclude LAD ischemia - fairly mild trop all things considered, EKG benign. Overall does not appear to be consistent with an acute anterior infarct as etiology of findings. Presents with influenza, septic shock, severe hypoxia.  - not a cath candidate given renal failure, coagulatopathy INR 2.4  -04/2024 coronary CTA: mild to mod RCA disease, mild LAD disease,  minimal LCX disease - medical therapy limited by hypotension, renal failure  - f/u coox and CVP once line placed. Pending state as well as MAP trends decide if any IV drips are indicated.  - some signs of fluid overload, with borderline MAPs would not diurese. Await line placement, may consider mild diuresis pending MAPs or if on pressor to maintain MAPs with diuresis.    3.Afib - off amio and beta blocker due to bradycardia.  - on eliquis  for stroke prevention - SR this admission  4. CKD - significant AKI earlier this month during admission that never resolved. Cr in October 1.2, more recently 3 to 3.2  5. Acute liver injury - AST 5500, ALT 2000 - likely related to sepsis, shock.   6. Coagulopathy - INR 2.4   Dorn Ross MD

## 2024-06-27 NOTE — Progress Notes (Signed)
 ED Pharmacy Antibiotic Sign Off An antibiotic consult was received from an ED provider for Vancomycin  and Zosyn   per pharmacy dosing for intraabdominal infection. A chart review was completed to assess appropriateness.   The following one time order(s) were placed:  Vancomycin  1500 mg IV Zosyn  3.375 g IV   Further antibiotic and/or antibiotic pharmacy consults should be ordered by the admitting provider if indicated.    Dail Cordella Misty, [facility]  Clinical Pharmacist 06/27/2024 12:36 AM

## 2024-06-27 NOTE — H&P (Signed)
 " History and Physical    Lauren Clark FMW:[id number] DOB: Apr 21, 1956 DOA: 06/26/2024  PCP: [name], MD  Patient coming from: Home  Chief Complaint: Shortness of breath  HPI: Lauren Clark is a 68 y.o. female with medical history significant of COPD, chronic hypoxemic respiratory failure on 2 L home oxygen, CAD, A-fib on Eliquis , HFpEF, ascending aortic aneurysm followed by cardiothoracic surgery, hypertension, hyperlipidemia, hypothyroidism, CKD stage IIIa, anemia, anxiety, depression, GERD, glaucoma, vision impairment, morbid obesity status post gastric sleeve surgery, PVD.  Recent hospital admission 12/13-12/24 for AKI, pyelonephritis, ileus, mildly elevated LFTs, and acute on chronic hypoxemic respiratory failure secondary to CHF exacerbation. Creatinine had peaked to 3.86.  Her diuretics were held and she was given IV albumin .  Creatinine improved to 2.97 at the time of discharge yesterday and nephrology resumed Lasix ..  Completed 7 days of Rocephin  for pyelonephritis.  She was evaluated by physical therapy and discharged with home health PT/OT yesterday afternoon.  Patient returned to the ED via EMS in the evening due to concern for shortness of breath, fall at home, and possible syncope.  She was saturating in the 80s on her home 2 L [place] with EMS.  She was wheezing on exam and given given albuterol  treatment by EMS.  Patient noted to have bruising on her left foot and complained of knee pain as well.  In the ED, patient was initially saturating well on 4.5 L [place] but after RN laid her down in the bed to place bedpan, patient desaturated to the mid 80s and oxygen saturation did not improve with nonrebreather.  She became tachypneic to the 30s and was subsequently placed on BiPAP with improvement of work of breathing and improvement of oxygen saturation to the 90s.  Afebrile.  Labs showing worsening leukocytosis (WBC count now 35.1), sodium 131, bicarb 17, anion gap 16, glucose 160, BUN 56,  creatinine 3.0, AST 586, ALT 276, alk phos 203, T. bili 1.2, lactic acid 4.1>3.3, CK 385, proBNP 8463, influenza A PCR positive, no blood cultures drawn.  UA showing large amount of leukocytes, 21-50 RBCs, 21-50 WBCs, and many bacteria EKG showing sinus or ectopic atrial rhythm with borderline PR prolongation, QTc 531, and no acute ischemic changes.  Troponin 157.  X-rays of pelvis, bilateral knees, and left foot negative for fractures.  Chest x-ray showing no acute findings or evidence of traumatic injury.  CT head/C-spine negative for acute findings.  Right upper quadrant abdominal ultrasound showing gallstones without sonographic findings of acute cholecystitis.  Showing increased hepatic parenchymal echogenicity typical of steatosis.  Patient was given Solu-Medrol  125 mg, vancomycin , Zosyn , IV mag 2 g, and continuous albuterol  neb treatment in the ED.  History limited as patient is currently on BiPAP.  She reports worsening dyspnea since after leaving the hospital yesterday.  Reports having a fall at home and possible loss of consciousness but she is not sure.  Reports some chest discomfort only when coughing but no chest pain or pressure otherwise.  Reports history of postprandial right upper quadrant abdominal pain for several months.  Denies vomiting.  She is chronically on 2 L home oxygen.  Review of Systems:  Review of Systems  All other systems reviewed and are negative.   Past Medical History:  Diagnosis Date   Anemia    s/p knee surgery   Anxiety    Aortic dissection (HCC)    Arthritis    COPD (chronic obstructive pulmonary disease) (HCC) 06/15/2024   Depression  GERD (gastroesophageal reflux disease)    Glaucoma    right eye uses drops   History of blood transfusion    [facility]   History of MRSA infection    Hyperlipidemia    Hypertension    Hypothyroidism    Impaired vision in both eyes    blind left eye, limited vision right eye   Morbid  obesity (HCC)    s/p Gastric sleeve- 8'17(loss thus far approx. 100 lbs)   Peripheral vascular disease    dvt right leg   Pre-diabetes    prior to gastric sleeve    Past Surgical History:  Procedure Laterality Date   ABDOMINAL HYSTERECTOMY  1981   with bilateral BSO   BREAST BIOPSY Right 10/18/2023   MM RT BREAST BX W LOC DEV 1ST LESION IMAGE BX SPEC STEREO GUIDE 10/18/2023 [facility]   CATARACT EXTRACTION Right 2010   [facility]   COLONOSCOPY     DEBRIDEMENT AND CLOSURE WOUND Right 07/07/2014   Dr. Renato   EYE SURGERY     bil cataraction with IOL as a child, cataract surgery late 1990- right   JOINT REPLACEMENT Right 2016   knee   KNEE DEBRIDEMENT Right 06/22/2014   Dr. Reyes Boer with wound vac   KNEE SURGERY     i&d KNEE POST REPLACEMENT   LAPAROSCOPIC GASTRIC SLEEVE RESECTION N/A 02/29/2016   Procedure: LAPAROSCOPIC GASTRIC SLEEVE RESECTION, UPPER ENDO;  Surgeon: Camellia Blush, MD;  Location: [facility];  Service: General;  Laterality: N/A;   TONSILLECTOMY     TOTAL HIP ARTHROPLASTY Right 06/14/2016   Procedure: RIGHT TOTAL HIP ARTHROPLASTY ANTERIOR APPROACH;  Surgeon: Donnice Car, MD;  Location: [facility];  Service: Orthopedics;  Laterality: Right;   TOTAL KNEE ARTHROPLASTY Left 09/05/2017   Procedure: LEFT TOTAL KNEE ARTHROPLASTY;  Surgeon: Car Donnice, MD;  Location: [facility];  Service: Orthopedics;  Laterality: Left;  Adductor Block   UPPER GI ENDOSCOPY  01/25/2016     reports that she has never smoked. She has never used smokeless tobacco. She reports that she does not drink alcohol and does not use drugs.  Allergies[1]  Family History  Problem Relation Age of Onset   Depression Mother    Hypertension Father    Breast cancer Neg Hx     Prior to Admission medications  Medication Sig Start Date End Date Taking? Authorizing Provider  BELSOMRA  10 MG TABS Take 1 tablet by mouth at bedtime. 11/03/23   [provider]  brimonidine -timolol   (COMBIGAN ) 0.2-0.5 % ophthalmic solution Place 1 drop into the right eye 2 (two) times daily. 06/10/24   [provider]  CALCIUM  CITRATE PO Take 600 mg by mouth daily.    [provider]  Cyanocobalamin  (VITAMIN B12 PO) Take 1 tablet by mouth in the morning.    [provider]  ELIQUIS  5 MG TABS tablet TAKE 1 TABLET(5 MG) BY MOUTH TWICE DAILY 02/09/24   Hilty, Vinie BROCKS, MD  [Paused] empagliflozin (JARDIANCE) 10 MG TABS tablet Take 10 mg by mouth daily. Wait to take this until: June 28, 2024    [provider]  FLUoxetine  (PROZAC ) 40 MG capsule Take 40 mg by mouth daily. 08/29/22   [provider]  furosemide  (LASIX ) 40 MG tablet Take 1 tablet (40 mg total) by mouth daily. 04/08/24   Hilty, Vinie BROCKS, MD  latanoprost  (XALATAN ) 0.005 % ophthalmic solution Place 1 drop into the right eye at bedtime.  [provider]  levothyroxine  (SYNTHROID ) 112 MCG tablet Take 112 mcg by mouth every morning. 09/08/23   [provider]  magnesium  oxide (MAG-OX) 400 (240 Mg) MG tablet Take 400 mg by mouth daily.    [provider]  NON FORMULARY Take 1 tablet by mouth 2 (two) times daily. Cranberry conascorib acid    [provider]  Omega 3-6-9 Fatty Acids (TRIPLE OMEGA-3-6-9 PO) Take 1 capsule by mouth daily.    [provider]  rosuvastatin  (CRESTOR ) 40 MG tablet Take 40 mg by mouth in the morning. 08/01/23   [provider]  Skin Protectants, Misc. (MINERIN CREME EX) Apply 1 application  topically daily.    [provider]    Physical Exam: Vitals:   06/27/24 0130 06/27/24 0145 06/27/24 0200 06/27/24 0333  BP: (!) 98/53 106/71 (!) 101/51 (!) 102/59  Pulse: 72 78 73 75  Resp: (!) 28 14 (!) 28 (!) 25  Temp:    98.2 F (36.8 C)  TempSrc:    Oral  SpO2: 97% 93% 93% 94%  Weight:      Height:        Physical Exam Vitals reviewed.  Constitutional:      General: She is not in acute distress. HENT:      Head: Normocephalic and atraumatic.  Eyes:     Extraocular Movements: Extraocular movements intact.  Cardiovascular:     Rate and Rhythm: Normal rate and regular rhythm.     Heart sounds: Normal heart sounds.  Pulmonary:     Effort: Pulmonary effort is normal. No respiratory distress.     Breath sounds: No stridor. No wheezing or rales.  Abdominal:     General: Bowel sounds are normal.     Palpations: Abdomen is soft.     Tenderness: There is no abdominal tenderness. There is no guarding.  Musculoskeletal:     Cervical back: Normal range of motion.     Right lower leg: Edema present.     Left lower leg: Edema present.     Comments: 4+ bilateral pedal edema Bruising noted on left foot  Skin:    General: Skin is warm and dry.  Neurological:     General: No focal deficit present.     Mental Status: She is alert and oriented to person, place, and time.     Labs on Admission: I have personally reviewed following labs and imaging studies  CBC: Recent Labs  Lab 06/23/24 0225 06/25/24 0209 06/26/24 2323 06/26/24 2327  WBC 21.3* 18.9* 35.1*  --   NEUTROABS  --  15.7*  --   --   HGB 11.8* 11.2* 12.2 14.3  HCT 36.9 34.4* 38.6 42.0  MCV 91.1 90.8 94.1  --   PLT 236 248 313  --    Basic Metabolic Panel: Recent Labs  Lab 06/23/24 0225 06/24/24 0740 06/25/24 0209 06/26/24 0325 06/26/24 2323 06/26/24 2327  NA 131* 131* 132* 133* 131* 133*  K 3.7 4.4 3.8 4.1 4.7 4.6  CL 97* 98 98 99 99 101  CO2 23 21* 22 24 17*  --   GLUCOSE 132* 128* 112* 96 160* 160*  BUN 62* 65* 69* 59* 56* 65*  CREATININE 3.58* 3.39* 3.20* 2.97* 3.08* 3.20*  CALCIUM  8.3* 8.6* 8.4* 8.6* 8.8*  --   MG 2.4  --   --   --   --   --   PHOS  --  4.1  --   --   --   --  GFR: Estimated Creatinine Clearance: 18.5 mL/min (A) (by C-G formula based on SCr of 3.2 mg/dL (H)). Liver Function Tests: Recent Labs  Lab 06/22/24 0547 06/23/24 0225 06/24/24 0740 06/25/24 0209 06/26/24 0325 06/26/24 2323   AST 88* 90*  --  89* 101* 586*  ALT 77* 87*  --  90* 95* 276*  ALKPHOS 141* 149*  --  114 108 203*  BILITOT 0.3 0.4  --  0.4 0.6 1.2  PROT 5.8* 5.8*  --  6.2* 6.3* 7.0  ALBUMIN  2.1* 2.3* 2.5* 2.8* 3.3* 3.4*   No results for input(s): LIPASE, AMYLASE in the last 168 hours. No results for input(s): AMMONIA in the last 168 hours. Coagulation Profile: Recent Labs  Lab 06/26/24 2323  INR 2.4*   Cardiac Enzymes: Recent Labs  Lab 06/27/24 0002  CKTOTAL 385*   BNP (last 3 results) Recent Labs    06/27/24 0002  PROBNP 8,463.0*   HbA1C: No results for input(s): HGBA1C in the last 72 hours. CBG: Recent Labs  Lab 06/26/24 0425  GLUCAP 97   Lipid Profile: No results for input(s): CHOL, HDL, LDLCALC, TRIG, CHOLHDL, LDLDIRECT in the last 72 hours. Thyroid  Function Tests: No results for input(s): TSH, T4TOTAL, FREET4, T3FREE, THYROIDAB in the last 72 hours. Anemia Panel: No results for input(s): VITAMINB12, FOLATE, FERRITIN, TIBC, IRON, RETICCTPCT in the last 72 hours. Urine analysis:    Component Value Date/Time   COLORURINE AMBER (A) 06/15/2024 0143   APPEARANCEUR CLOUDY (A) 06/15/2024 0143   LABSPEC 1.018 06/15/2024 0143   PHURINE 6.0 06/15/2024 0143   GLUCOSEU >=500 (A) 06/15/2024 0143   HGBUR SMALL (A) 06/15/2024 0143   BILIRUBINUR NEGATIVE 06/15/2024 0143   KETONESUR 5 (A) 06/15/2024 0143   PROTEINUR 100 (A) 06/15/2024 0143   NITRITE POSITIVE (A) 06/15/2024 0143   LEUKOCYTESUR MODERATE (A) 06/15/2024 0143    Radiological Exams on Admission: US  Abdomen Limited RUQ (LIVER/GB) Result Date: 06/27/2024 CLINICAL DATA:  Epigastric pain. EXAM: ULTRASOUND ABDOMEN LIMITED RIGHT UPPER QUADRANT COMPARISON:  Ultrasound 1 week ago 06/20/2024 FINDINGS: Gallbladder: Physiologically distended. No gallbladder wall thickening. Small gallstones. No pericholecystic fluid. No sonographic Murphy sign noted by sonographer. Common bile duct:  Diameter: 4 mm, normal Liver: Diffusely increased parenchymal echogenicity. The echogenic lesion on prior exam was not delineated on the current exam. Technically limited due to difficulty with patient positioning. Portal vein is patent on color Doppler imaging with normal direction of blood flow towards the liver. Other: No right upper quadrant ascites. IMPRESSION: 1. Gallstones without sonographic findings of acute cholecystitis. 2. Increased hepatic parenchymal echogenicity typical of steatosis. 3. Previous echogenic right lobe lesion is not seen on the current exam. Electronically Signed   By: Andrea Gasman M.D.   On: 06/27/2024 01:22   CT Head Wo Contrast Result Date: 06/27/2024 EXAM: CT HEAD AND CERVICAL SPINE 06/26/2024 11:53:00 PM TECHNIQUE: CT of the head and cervical spine was performed without the administration of intravenous contrast. Multiplanar reformatted images are provided for review. Automated exposure control, iterative reconstruction, and/or weight based adjustment of the mA/kV was utilized to reduce the radiation dose to as low as reasonably achievable. COMPARISON: None available. CLINICAL HISTORY: Head trauma, minor (Age >= 65y) FINDINGS: CT HEAD BRAIN AND VENTRICLES: No acute intracranial hemorrhage. No mass effect or midline shift. No abnormal extra-axial fluid collection. No evidence of acute infarct. No hydrocephalus. A 1.1 cm calcified extra-axial dural based mass along the anterior falx is unchanged. ORBITS: No acute abnormality. SINUSES AND MASTOIDS: No acute abnormality.  SOFT TISSUES AND SKULL: No acute skull fracture. No acute soft tissue abnormality. CT CERVICAL SPINE BONES AND ALIGNMENT: No acute fracture or traumatic malalignment. DEGENERATIVE CHANGES: No significant degenerative changes. SOFT TISSUES: No prevertebral soft tissue swelling. IMPRESSION: 1. No acute intracranial abnormality. 2. No acute fracture or traumatic malalignment of the cervical spine. 3. Unchanged  small putative left parfalcine meningioma. Electronically signed by: Gilmore Molt 06/27/2024 12:03 AM EST RP Workstation: [id number]   CT Cervical Spine Wo Contrast Result Date: 06/27/2024 EXAM: CT HEAD AND CERVICAL SPINE 06/26/2024 11:53:00 PM TECHNIQUE: CT of the head and cervical spine was performed without the administration of intravenous contrast. Multiplanar reformatted images are provided for review. Automated exposure control, iterative reconstruction, and/or weight based adjustment of the mA/kV was utilized to reduce the radiation dose to as low as reasonably achievable. COMPARISON: None available. CLINICAL HISTORY: Head trauma, minor (Age >= 65y) FINDINGS: CT HEAD BRAIN AND VENTRICLES: No acute intracranial hemorrhage. No mass effect or midline shift. No abnormal extra-axial fluid collection. No evidence of acute infarct. No hydrocephalus. A 1.1 cm calcified extra-axial dural based mass along the anterior falx is unchanged. ORBITS: No acute abnormality. SINUSES AND MASTOIDS: No acute abnormality. SOFT TISSUES AND SKULL: No acute skull fracture. No acute soft tissue abnormality. CT CERVICAL SPINE BONES AND ALIGNMENT: No acute fracture or traumatic malalignment. DEGENERATIVE CHANGES: No significant degenerative changes. SOFT TISSUES: No prevertebral soft tissue swelling. IMPRESSION: 1. No acute intracranial abnormality. 2. No acute fracture or traumatic malalignment of the cervical spine. 3. Unchanged small putative left parfalcine meningioma. Electronically signed by: Gilmore Molt 06/27/2024 12:03 AM EST RP Workstation: [id number]   DG Knee Complete 4 Views Left Result Date: 06/26/2024 CLINICAL DATA:  Trauma, fall. EXAM: LEFT KNEE - COMPLETE 4+ VIEW COMPARISON:  06/06/2022 FINDINGS: Knee arthroplasty in expected alignment. Prior patellar resurfacing. No acute or periprosthetic fracture. No periprosthetic lucency. No significant joint effusion. Mild generalized soft tissue edema. IMPRESSION:  Knee arthroplasty without complication or acute fracture. Electronically Signed   By: Andrea Gasman M.D.   On: 06/26/2024 23:56   DG Foot Complete Left Result Date: 06/26/2024 CLINICAL DATA:  Trauma, fall. EXAM: LEFT FOOT - COMPLETE 3+ VIEW COMPARISON:  None Available. FINDINGS: There is no evidence of fracture or dislocation. Minor talonavicular degenerative change. Dorsal soft tissue edema. IMPRESSION: Dorsal soft tissue edema. No fracture or subluxation. Electronically Signed   By: Andrea Gasman M.D.   On: 06/26/2024 23:55   DG Knee Complete 4 Views Right Result Date: 06/26/2024 CLINICAL DATA:  Trauma, fall. EXAM: RIGHT KNEE - COMPLETE 4+ VIEW COMPARISON:  11/15/2022 FINDINGS: Knee arthroplasty in expected alignment. Prior patellar resurfacing. No acute or periprosthetic fracture. Minimal joint effusion. Mild generalized soft tissue edema. IMPRESSION: Knee arthroplasty without complication or acute fracture. Electronically Signed   By: Andrea Gasman M.D.   On: 06/26/2024 23:54   DG Chest Port 1 View Result Date: 06/26/2024 CLINICAL DATA:  Trauma, fall. EXAM: PORTABLE CHEST 1 VIEW COMPARISON:  06/17/2024 FINDINGS: Cardiomegaly is stable. Mediastinal contours are unchanged. Stable interstitial coarsening. Subsegmental atelectasis/scarring in the right mid lung. No pneumothorax. No large pleural effusion. On limited assessment, no acute osseous findings. Bilateral shoulder arthropathy. IMPRESSION: 1. No acute findings or evidence of traumatic injury. 2. Stable cardiomegaly. Electronically Signed   By: Andrea Gasman M.D.   On: 06/26/2024 23:53   DG Pelvis Portable Result Date: 06/26/2024 CLINICAL DATA:  Trauma, fall. EXAM: PORTABLE PELVIS 1-2 VIEWS COMPARISON:  06/16/2024 FINDINGS: Right hip arthroplasty in expected  alignment. No acute or periprosthetic fracture. Pubic rami are intact. No pubic symphyseal or sacroiliac diastasis. Left hip osteoarthritis. IMPRESSION: Right hip arthroplasty  without complication or acute fracture. Electronically Signed   By: Andrea Gasman M.D.   On: 06/26/2024 23:52    Assessment and Plan  Acute COPD exacerbation in the setting of influenza A infection Acute on chronic HFpEF Acute on chronic hypoxemic respiratory failure Patient was noted to be saturating in the 80s on her home 2 L [place] with EMS.  She was wheezing with EMS and was given albuterol  treatment.  On arrival to the ED, she was still wheezing so she was given continuous albuterol  neb treatment, Solu-Medrol  125 mg, and IV mag 2 g.  She was initially saturating well on 4.5 L  in the ED but after RN laid her down in the bed to place bedpan, patient desaturated to mid 80s and oxygen saturation did not improve with nonrebreather.  She became tachypneic to the 30s and was subsequently placed on BiPAP with improvement of work of breathing and improvement of oxygen saturation to the 90s.  Blood gas without evidence of hypercapnia.  No longer wheezing on exam.  Influenza A PCR positive.  Although chest x-ray is not showing any acute findings, she does have significant peripheral edema on exam and proBNP 8463.  Not hypotensive.  SBP in the 140s and IV Lasix  40 mg x 1 ordered. Stat CT chest without contrast ordered for further evaluation.  PE is also on the differential but less likely as she is not tachycardic and chronically anticoagulated with Eliquis .  Best to avoid giving IV contrast given recent AKI and low GFR.  Start Tamiflu  when she is no longer on BiPAP.  Droplet and contact precautions.  Continue IV steroids, scheduled and PRN bronchodilator treatments.  Elevated troponin History of CAD Elevated troponin possibly due to demand ischemia.  Troponin 157 but EKG without acute ischemic changes.  Patient reports some chest discomfort only when coughing but no chest pain or pressure otherwise.  Repeat troponin ordered.  If troponin is trending up, then start IV heparin  and consult cardiology for  possible ACS.  Her coronary calcium  score was in the 97th percentile (CAD-RADS3: Modertate stenosis) on CT done in October 2025.  AKI on CKD stage IIIa Baseline creatinine around 1.2.  Creatinine had peaked to 3.86 during recent admission and now improved to 3.0.  Giving a dose of IV Lasix  as above due to concern for decompensated heart failure/volume overload.  Continue to monitor renal function.  UTI Recently finished 7-day course of Rocephin  for pyelonephritis.  Now having worsening leukocytosis on labs.  UA continues to show evidence of pyuria and bacteria.  Continue vancomycin  and Zosyn  at this time.  Follow-up urine culture.  Stat CT abdomen pelvis ordered to rule out renal/perinephric abscess or emphysematous pyelonephritis.  Severe sepsis In the setting of influenza A infection and UTI.  Meets criteria for severe sepsis with worsening leukocytosis and lactic acidosis.  No hypotension and lactic acidosis improved on repeat labs.  SBP currently in the 140s.  No blood cultures drawn in the ED prior to initiation of antibiotics.  Blood cultures ordered now.  Continue broad-spectrum antibiotics and trend lactate.  Elevated LFTs Transaminases and alkaline phosphatase worse compared to labs done during recent hospitalization. AST 586, ALT 276, alk phos 203.  Patient is reporting history of postprandial abdominal pain for several months.  No abdominal tenderness on exam at this time.  No vomiting.  Right upper quadrant abdominal ultrasound showing gallstones without sonographic findings of acute cholecystitis.  Showing increased hepatic parenchymal echogenicity typical of steatosis.  She does have worsening leukocytosis on labs.?Choledocholithiasis/cholangitis but afebrile, no CBD dilation on ultrasound, and T. bili is normal.  Acute hepatitis panel ordered and continue to monitor LFTs.  She will need outpatient evaluation by general surgery for consideration of cholecystectomy after her acute pulmonary  and cardiac issues resolve.    Paroxysmal A-fib Start heparin  if troponin trending up as above or if patient cannot be weaned off BiPAP/unable to take p.o. meds/Eliquis .  Fall No traumatic injuries identified on imaging of chest, pelvis, bilateral knees, left foot, head, and C-spine.  Fall precautions.  Will need PT/OT eval once her respiratory status improves.  Mild hyponatremia Stable since recent hospitalization and likely due to hypervolemia.  A dose of IV Lasix  given as above and continue to monitor sodium level.  High anion gap metabolic acidosis Bicarb 17, anion gap 16.  Likely due to AKI on CKD and lactic acidosis.  Continue to monitor labs very closely.  QT prolongation QTc 531 on EKG.  Monitor potassium and magnesium  levels.  Avoid QT prolonging drugs if possible.  Follow-up repeat EKG in the morning.  Hyperlipidemia Hypothyroidism Anxiety and depression GERD PVD Resume p.o. meds when no longer on BiPAP/no longer NPO.  Code Status: Patient was listed as DNR/DNI during her recent hospitalization.  I have also discussed CODE STATUS with her and she wishes to remain DNR/DNI.  She does not want CPR or intubation/mechanical ventilation. Family Communication: No family available at this time. Level of care: Progressive Care Unit Admission status: It is my clinical opinion that admission to INPATIENT is reasonable and necessary because of the expectation that this patient will require hospital care that crosses at least 2 midnights to treat this condition based on the medical complexity of the problems presented.  Given the aforementioned information, the predictability of an adverse outcome is felt to be significant.  Editha Ram MD [facility]  If 7PM-7AM, please contact night-coverage [email or web address]  06/27/2024, 4:26 AM       [1]  Allergies Allergen Reactions   Crestor  [Rosuvastatin  Calcium ] Other (See Comments)    Muscle aches     Lipitor  [Atorvastatin] Other (See Comments)    Muscle aches    Hctz [Hydrochlorothiazide] Other (See Comments)    Pt doesn't remember   "

## 2024-06-27 NOTE — Progress Notes (Addendum)
 PHARMACY - ANTICOAGULATION CONSULT NOTE  Pharmacy Consult for IV heparin  Indication: chest pain/ACS  Allergies[1]  Patient Measurements: Height: 5' 5 (165.1 cm) Weight: 91.6 kg (202 lb) IBW/kg (Calculated) : 57 HEPARIN  DW (KG): 77.4  Vital Signs: Temp: 97.7 F (36.5 C) (12/25 1452) Temp Source: Axillary (12/25 1452) BP: 106/81 (12/25 1452) Pulse Rate: 63 (12/25 1452)  Labs: Recent Labs    06/25/24 0209 06/26/24 0325 06/26/24 2323 06/26/24 2327 06/27/24 0002 06/27/24 0443 06/27/24 0705  HGB 11.2*  --  12.2 14.3  --  12.6 11.6*  HCT 34.4*  --  38.6 42.0  --  37.0 36.7  PLT 248  --  313  --   --   --  241  LABPROT  --   --  27.4*  --   --   --   --   INR  --   --  2.4*  --   --   --   --   CREATININE 3.20*   < > 3.08* 3.20*  --   --  3.36*  CKTOTAL  --   --   --   --  385*  --  894*   < > = values in this interval not displayed.    Estimated Creatinine Clearance: 17.7 mL/min (A) (by C-G formula based on SCr of 3.36 mg/dL (H)).   Medical History: Past Medical History:  Diagnosis Date   Anemia    s/p knee surgery   Anxiety    Aortic dissection (HCC)    Arthritis    COPD (chronic obstructive pulmonary disease) (HCC) 06/15/2024   Depression    GERD (gastroesophageal reflux disease)    Glaucoma    right eye uses drops   History of blood transfusion    [facility]   History of MRSA infection    Hyperlipidemia    Hypertension    Hypothyroidism    Impaired vision in both eyes    blind left eye, limited vision right eye   Morbid obesity (HCC)    s/p Gastric sleeve- 8'17(loss thus far approx. 100 lbs)   Peripheral vascular disease    dvt right leg   Pre-diabetes    prior to gastric sleeve    Assessment: Lauren Clark is a 68 y.o. year old female admitted on 06/26/2024 with concern for ACS. Eliquis  prior to admission for afib (LD 12/24 AM per patient). Of note, pt currently on BiPAP unable to take oral meds at this time. Pharmacy  consulted to dose heparin . With presence of DOAC pta, will trend aPTT until correlates with anti-xa (heparin  level).  aPTT is subtherapeutic at 48 seconds. Will increase dose and recheck aPTT until heparin  level correlates. AM CBC stable.  Goal of Therapy:  Heparin  level 0.3-0.7 units/ml aPTT 66-102 seconds Monitor platelets by anticoagulation protocol: Yes   Plan:  Bolus heparin  2250 units IV x1 Increase heparin  infusion to 1200 units/hr 8h aPTT  Daily aPTT, heparin  level, CBC, and monitoring for bleeding F/u plans for anticoagulation   Thank you for allowing pharmacy to participate in this patient's care.  [name], PharmD PGY1 Clinical Pharmacist [name]  06/27/2024 3:46 PM         [1]  Allergies Allergen Reactions   Crestor  [Rosuvastatin  Calcium ] Other (See Comments)    Muscle aches     Hctz [Hydrochlorothiazide] Other (See Comments)    Pt doesn't remember   Lipitor [Atorvastatin] Other (See Comments)    Muscle  aches

## 2024-06-27 NOTE — Progress Notes (Signed)
 Pharmacy Antibiotic Note  Lauren Clark is a 68 y.o. female admitted on 06/26/2024 with possible sepsis.  Pharmacy has been consulted for Vancomycin  and Zosyn  dosing.  Plan: Vancomycin  1000 mg IV q48h Zosyn  3.375 g IV q8h   Height: 5' 5 (165.1 cm) Weight: 91.6 kg (202 lb) IBW/kg (Calculated) : 57  Temp (24hrs), Avg:98.2 F (36.8 C), Min:98.1 F (36.7 C), Max:98.2 F (36.8 C)  Recent Labs  Lab 06/23/24 0225 06/24/24 0740 06/25/24 0209 06/26/24 0325 06/26/24 2323 06/26/24 2327 06/26/24 2328 06/27/24 0413  WBC 21.3*  --  18.9*  --  35.1*  --   --   --   CREATININE 3.58* 3.39* 3.20* 2.97* 3.08* 3.20*  --   --   LATICACIDVEN  --   --   --   --   --   --  4.1* 3.3*    Estimated Creatinine Clearance: 18.5 mL/min (A) (by C-G formula based on SCr of 3.2 mg/dL (H)).    Allergies[1]   Lauren Clark 06/27/2024 6:19 AM     [1]  Allergies Allergen Reactions   Crestor  [Rosuvastatin  Calcium ] Other (See Comments)    Muscle aches     Lipitor [Atorvastatin] Other (See Comments)    Muscle aches    Hctz [Hydrochlorothiazide] Other (See Comments)    Pt doesn't remember

## 2024-06-27 NOTE — ED Notes (Signed)
 RT called for HFNC and neb order. Light green and pink tubes recollected.

## 2024-06-27 NOTE — Progress Notes (Signed)
 PHARMACY NOTE:  ANTIMICROBIAL RENAL DOSAGE ADJUSTMENT  Current antimicrobial regimen includes a mismatch between antimicrobial dosage and estimated renal function.  As per policy approved by the Pharmacy & Therapeutics and Medical Executive Committees, the antimicrobial dosage will be adjusted accordingly.  Current antimicrobial dosage:  vancomycin  1000mg  q48h, zosyn  3.375g q8h  Indication: sepsis  Renal Function:  Estimated Creatinine Clearance: 17.7 mL/min (A) (by C-G formula based on SCr of 3.36 mg/dL (H)). []      On intermittent HD, scheduled: []      On CRRT    Antimicrobial dosage has been changed to:  vancomycin  dose per levels, zosyn  2.25g q8h   Thank you for allowing pharmacy to be a part of this patient's care.  Leonor GORMAN Bash, [facility] 06/27/2024 9:25 AM

## 2024-06-27 NOTE — ED Notes (Signed)
 Spoke with Lab tech again who states blood tubes are unable to be recevied/processed d/t IT issue which is currently being worked on. Lab tech provided with this Rns direct contact number for further needs.

## 2024-06-27 NOTE — Plan of Care (Signed)

## 2024-06-27 NOTE — Consult Note (Addendum)
 "  Cardiology Consultation   Patient ID: GYPSY KELLOGG MRN: [id number]; DOB: Apr 13, 1956  Admit date: 06/26/2024 Date of Consult: 06/27/2024  PCP:  [name], MD   [facility] Providers Cardiologist:  Vinie JAYSON Maxcy, MD        Patient Profile: DESIRAE MANCUSI is a 68 y.o. female with a hx of morbid obesity s/p gastric sleeve, history of DVT, A-fib, chronic diastolic heart failure, hypertension, hyperlipidemia, thoracic aortic aneurysm, hypothyroidism and nonobstructive CAD who is being seen 06/27/2024 for the evaluation of elevated troponin at the request of Dr. Cosette.  History of Present Illness: Ms. Cercone is a 68 year old female with past medical history of morbid obesity s/p gastric sleeve, history of DVT, A-fib, chronic diastolic heart failure, hypertension, hyperlipidemia, thoracic aortic aneurysm, hypothyroidism and nonobstructive CAD.  She was previously admitted in May 2024 with dissection of the ascending aorta with evidence of chronic intramural hematoma along the right lateral ascending aortic wall.  She was not a candidate for surgical resection and recommended to remain off of anticoagulation therapy.  Initial plan was for palliative care follow-up.  She was readmitted in July 2024 with acute on chronic diastolic heart failure.  Echocardiogram at the time showed EF 60 to 65%, no significant valvular disease, ascending aortic aneurysm measuring at 4.9 cm.  Patient was treated with IV Lasix .  When she was seen by Dr. Maxcy in October 2025, she reported some progressive chest pressure.  EKG showed sinus bradycardia.  It was mentioned that patient's PCP discontinued her amiodarone .  EKG obtained in office shows sinus bradycardia.  Lasix  was increased to 40 mg daily.  Outpatient echocardiogram, coronary CTA as well as CTA of the chest aorta was ordered.  CTA of the chest aorta showed aortic root aneurysm measuring up to 53 mm, acing aorta measuring up to 40 mm, penetrating  ulceration in the ascending aorta in the 6 o'clock position measuring up to 1.6 x 0.6, similar to the previous study.  Coronary CTA obtained on 04/22/2024 showed coronary calcium  score of 987 which placed the patient 97th percentile for age and sex matched control, 40 to 50% mild mixed density plaque in the proximal RCA, 25 to 49% LAD and OM1 lesion.  Minimal plaque in the left circumflex less than 25%.  FFR analysis showed no significant lesion.  Echocardiogram obtained on 05/20/2024 showed EF 55 to 60%, mild concentric LVH, grade 2 DD, RV systolic function and size was normal, mild MR, mild to moderate AI, moderate dilatation of the aortic root measuring 44 mm, mild dilatation the ascending aorta measuring 43 mm.  Patient was last seen in the cardiology office on 06/04/2024 at which time she was doing well.  Unfortunately, patient has been admitted from 12/13 - 12/24 with left pyelonephritis.  She presented to the hospital with complaint of left flank pain and dysuria.  White blood cell count was elevated at 21,000, creatinine peaked at 3.86 from the baseline 1.2 during the hospitalization.  Image showed atrophic right kidney with concern for left pyelonephritis.  She completed 7 days of Rocephin  on 06/22/2024.  Bernadine was held.  Patient had ileus during the hospitalization however resolved on conservative management prior to discharge.  She had a mildly elevated transaminase.  Right upper quadrant ultrasound showed finding of sludge and a small stone but no evidence of cholecystitis.  She was ultimately discharged on 12/24 however bounce back to the emergency room on the night of the discharge.  According to  ED note, she fell on the floor and was unable to get up.  She had worsening shortness of breath.  She was on the floor for roughly 2 hours prior to EMS arrival.  It was not clear whether the patient had a loss consciousness.  She wear 2 L oxygen chronically, O2 requirement was increased and route to ED.   Creatinine on arrival was 2.97, sodium 133, potassium 4.1.  White blood cell count was elevated 35.1.  Pelvis x-ray was negative for fracture.  Initial lactic acid was elevated 4.1.  Patient was subsequently admitted to the hospitalist service.  proBNP was elevated at 8463.  Total CK3 185.  Serial troponin 157--> 323.  Lactic acid eventually came down to 1.8.  White blood cell count improving to 26.5.  However elevated transaminases worsened, AST increased to 5581 and ALT increased to 2076.  Chest x-ray showed no acute finding.  Viral panel positive for flu A.  CT of the chest showed interval development of patchy ground glass opacity with interlobar septal thickening in both upper lobes, left greater than right, this was concerning for multifocal pneumonia.  Cardiology service was consulted for elevated troponin.  At the time of interview, patient is on BiPAP therapy.  Blood pressure has been low in the 80s.  Stat echocardiogram obtained on 06/27/2024 showed EF 30 to 35%, cannot exclude possible LAD ischemia however with apical akinesis, this could suggest stress-induced cardiomyopathy, grade 2 DD, trivial MR. During the interview, patient can only answer yes or no questions, majority of the information was collected through chart review.   Past Medical History:  Diagnosis Date   Anemia    s/p knee surgery   Anxiety    Aortic dissection (HCC)    Arthritis    COPD (chronic obstructive pulmonary disease) (HCC) 06/15/2024   Depression    GERD (gastroesophageal reflux disease)    Glaucoma    right eye uses drops   History of blood transfusion    [facility]   History of MRSA infection    Hyperlipidemia    Hypertension    Hypothyroidism    Impaired vision in both eyes    blind left eye, limited vision right eye   Morbid obesity (HCC)    s/p Gastric sleeve- 8'17(loss thus far approx. 100 lbs)   Peripheral vascular disease    dvt right leg   Pre-diabetes    prior to  gastric sleeve    Past Surgical History:  Procedure Laterality Date   ABDOMINAL HYSTERECTOMY  1981   with bilateral BSO   BREAST BIOPSY Right 10/18/2023   MM RT BREAST BX W LOC DEV 1ST LESION IMAGE BX SPEC STEREO GUIDE 10/18/2023 [facility]   CATARACT EXTRACTION Right 2010   [facility]   COLONOSCOPY     DEBRIDEMENT AND CLOSURE WOUND Right 07/07/2014   Dr. Renato   EYE SURGERY     bil cataraction with IOL as a child, cataract surgery late 1990- right   JOINT REPLACEMENT Right 2016   knee   KNEE DEBRIDEMENT Right 06/22/2014   Dr. Reyes Boer with wound vac   KNEE SURGERY     i&d KNEE POST REPLACEMENT   LAPAROSCOPIC GASTRIC SLEEVE RESECTION N/A 02/29/2016   Procedure: LAPAROSCOPIC GASTRIC SLEEVE RESECTION, UPPER ENDO;  Surgeon: Camellia Blush, MD;  Location: [facility];  Service: General;  Laterality: N/A;   TONSILLECTOMY     TOTAL HIP ARTHROPLASTY Right 06/14/2016   Procedure: RIGHT TOTAL  HIP ARTHROPLASTY ANTERIOR APPROACH;  Surgeon: Donnice Car, MD;  Location: [facility];  Service: Orthopedics;  Laterality: Right;   TOTAL KNEE ARTHROPLASTY Left 09/05/2017   Procedure: LEFT TOTAL KNEE ARTHROPLASTY;  Surgeon: Car Donnice, MD;  Location: [facility];  Service: Orthopedics;  Laterality: Left;  Adductor Block   UPPER GI ENDOSCOPY  01/25/2016     Home Medications:  Prior to Admission medications  Medication Sig Start Date End Date Taking? Authorizing Provider  BELSOMRA  10 MG TABS Take 1 tablet by mouth at bedtime. 11/03/23   [provider]  brimonidine -timolol  (COMBIGAN ) 0.2-0.5 % ophthalmic solution Place 1 drop into the right eye 2 (two) times daily. 06/10/24   [provider]  CALCIUM  CITRATE PO Take 600 mg by mouth daily.    [provider]  Cyanocobalamin  (VITAMIN B12 PO) Take 1 tablet by mouth in the morning.    [provider]  ELIQUIS  5 MG TABS tablet TAKE 1 TABLET(5 MG) BY MOUTH TWICE DAILY 02/09/24   Hilty, Vinie BROCKS, MD  [Paused]  empagliflozin (JARDIANCE) 10 MG TABS tablet Take 10 mg by mouth daily. Wait to take this until: June 28, 2024    [provider]  FLUoxetine  (PROZAC ) 40 MG capsule Take 40 mg by mouth daily. 08/29/22   [provider]  furosemide  (LASIX ) 40 MG tablet Take 1 tablet (40 mg total) by mouth daily. 04/08/24   Hilty, Vinie BROCKS, MD  latanoprost  (XALATAN ) 0.005 % ophthalmic solution Place 1 drop into the right eye at bedtime.     [provider]  levothyroxine  (SYNTHROID ) 112 MCG tablet Take 112 mcg by mouth every morning. 09/08/23   [provider]  magnesium  oxide (MAG-OX) 400 (240 Mg) MG tablet Take 400 mg by mouth daily.    [provider]  NON FORMULARY Take 1 tablet by mouth 2 (two) times daily. Cranberry conascorib acid    [provider]  Omega 3-6-9 Fatty Acids (TRIPLE OMEGA-3-6-9 PO) Take 1 capsule by mouth daily.    [provider]  rosuvastatin  (CRESTOR ) 40 MG tablet Take 40 mg by mouth in the morning. 08/01/23   [provider]  Skin Protectants, Misc. (MINERIN CREME EX) Apply 1 application  topically daily.    [provider]    Scheduled Meds:  ipratropium-albuterol   3 mL Nebulization Q8H   methylPREDNISolone  (SOLU-MEDROL ) injection  80 mg Intravenous Daily   oseltamivir   30 mg Oral Once   vancomycin  variable dose per unstable renal function (pharmacist dosing)   Does not apply See admin instructions   Continuous Infusions:  heparin  950 Units/hr (06/27/24 0804)   piperacillin -tazobactam (ZOSYN )  IV     PRN Meds: albuterol , perflutren  lipid microspheres (DEFINITY ) IV suspension  Allergies:   Allergies[1]  Social History:   Social History   Socioeconomic History   Marital status: Widowed    Spouse name: Not on file   Number of children: Not on file   Years of education: 0   Highest education level: Not on file  Occupational History   Occupation: former public relations account executive    Comment: [employer]   Tobacco Use   Smoking status: Never   Smokeless tobacco: Never  Vaping Use   Vaping status: Never Used  Substance and Sexual Activity   Alcohol use: No   Drug use: No   Sexual activity: Not Currently    Birth control/protection: Surgical  Other Topics Concern   Not on file  Social History Narrative   epworth sleepiness scale =  2 (02/19/16)   Social Drivers of Health   Tobacco Use: Low Risk (06/15/2024)   Patient History    Smoking Tobacco Use: Never    Smokeless Tobacco Use: Never    Passive Exposure: Not on file  Financial Resource Strain: Not on file  Food Insecurity: No Food Insecurity (06/15/2024)   Epic    Worried About Programme Researcher, Broadcasting/film/video in the Last Year: Never true    Ran Out of Food in the Last Year: Never true  Transportation Needs: No Transportation Needs (06/15/2024)   Epic    Lack of Transportation (Medical): No    Lack of Transportation (Non-Medical): No  Physical Activity: Not on file  Stress: Not on file  Social Connections: Moderately Integrated (06/15/2024)   Social Connection and Isolation Panel    Frequency of Communication with Friends and Family: Twice a week    Frequency of Social Gatherings with Friends and Family: Once a week    Attends Religious Services: More than 4 times per year    Active Member of [employer] or Organizations: Yes    Attends Banker Meetings: 1 to 4 times per year    Marital Status: Widowed  Intimate Partner Violence: Not At Risk (06/15/2024)   Epic    Fear of Current or Ex-Partner: No    Emotionally Abused: No    Physically Abused: No    Sexually Abused: No  Depression (PHQ2-9): Not on file  Alcohol Screen: Not on file  Housing: Low Risk (06/15/2024)   Epic    Unable to Pay for Housing in the Last Year: No    Number of Times Moved in the Last Year: 0    Homeless in the Last Year: No  Utilities: Not At Risk (06/15/2024)   Epic    Threatened with loss of utilities: No  Health Literacy: Not on file     Family History:    Family History  Problem Relation Age of Onset   Depression Mother    Hypertension Father    Breast cancer Neg Hx      ROS:  Please see the history of present illness.   All other ROS reviewed and negative.     Physical Exam/Data: Vitals:   06/27/24 0427 06/27/24 0500 06/27/24 0635 06/27/24 0645  BP:  114/80 (!) 79/38 (!) 81/46  Pulse: 73 62 (!) 55 (!) 52  Resp: 19 (!) 23 17 (!) 21  Temp:   98.2 F (36.8 C)   TempSrc:   Axillary   SpO2: 94% 96% 95% 100%  Weight:      Height:       No intake or output data in the 24 hours ending 06/27/24 0939    06/26/2024   11:17 PM 06/25/2024    6:03 AM 06/24/2024    5:00 AM  Last 3 Weights  Weight (lbs) 202 lb 224 lb 13.9 oz 229 lb 4.5 oz  Weight (kg) 91.627 kg 102 kg 104 kg     Body mass index is 33.61 kg/m.  General: Weak, on BiPAP therapy. HEENT: normal Neck: no JVD Vascular: Lower extremities cold, bruising noted in left foot.  Weak lower extremity pulses Cardiac:  normal S1, S2; RRR; no murmur  Lungs: On BiPAP therapy, anterior exam clear Abd: soft, nontender, no hepatomegaly  Ext: 1-2+ pedal edema, no pretibial edema Musculoskeletal:  No deformities, BUE and BLE strength normal and equal Skin: Lower extremity cold, upper extremity warm Neuro: Unable to assess Psych: Unable to assess  EKG:  The EKG was personally reviewed and demonstrates: Normal sinus rhythm, heart rate 56 bpm, no significant ST-T wave changes. Telemetry:  Telemetry was personally reviewed and demonstrates: Sinus rhythm, no significant ventricular ectopy  Relevant CV Studies:  Coronary CTA 04/22/2024 Coronary Arteries:  Normal coronary origin.  Right dominance.   Left main: The left main is a large caliber vessel with a normal take off from the left coronary cusp that bifurcates to form a left anterior descending artery and a left circumflex artery. There is minimal calcified plaque (<25%).   Left anterior descending artery:  The proximal LAD contains mild mixed density plaque (25-49%). The mid and distal segments are patent. D1 and D2 are too small for analysis. D3 contains mild mixed density plaque (25-49%).   Left circumflex artery: The LCX is non-dominant. There is minimal non-calcified plaque (<25%). OM1 contains mild non-calcified plaque (25-49%). OM2 is patent.   Right coronary artery: The RCA is dominant with normal take off from the right coronary cusp. There is mild to moderate plaque in the proximal segment (40-50%). The mid and distal segments contain minimal non-calcified plaque (<25%). The RCA terminates as a PDA and right posterolateral Errin Chewning without evidence of plaque or stenosis.   Right Atrium: Right atrial size is within normal limits.   Right Ventricle: The right ventricular cavity is within normal limits.   Left Atrium: Left atrial size is normal in size with no left atrial appendage filling defect.   Left Ventricle: The ventricular cavity size is within normal limits.   Pulmonary arteries: Dilated pulmonary artery suggestive of pulmonary hypertension.   Pulmonary veins: Normal pulmonary venous drainage.   Pericardium: Normal thickness without significant effusion or calcium  present.   Cardiac valves: The aortic valve is trileaflet without significant calcification. The mitral valve is normal without significant calcification.   Aorta: Aortic root aneurysm of the RCC which measures up to 53 mm (double-oblique). Ascending aorta measures up to 40 mm. Penetrating aortic ulceration noted in the ascending aorta in the 6 o'clock position measuring up to 1.6 cm x 0.6 cm, similar to prior study.   Extra-cardiac findings: See attached radiology report for non-cardiac structures.   IMPRESSION: 1. Coronary calcium  score of 987. This was 97th percentile for age-, sex, and race-matched controls.   2. Normal coronary origin with right dominance.   3. Mild to moderate mixed  density plaque in the proximal RCA (40-50%).   4. Mild mixed density plaque in the proximal LAD and OM1 (25-49%).   5. Minimal plaque in the LCX (<25%).   6. Aortic root aneurysm with dilation of the RCC up to 53 mm. Unchanged from prior study.   7. Ascending aorta measures 40 mm. Penetrating aortic ulceration in the ascending aorta with similar appearance to prior study.   8. Dilated pulmonary artery suggestive of pulmonary hypertension.   FINDINGS: 1. Left Main: 1.0; low likelihood of hemodynamic significance.   2. LAD: 0.90; low likelihood of hemodynamic significance. 3. D3: 0.92; low likelihood of hemodynamic significance. 4. LCX: 0.98; low likelihood of hemodynamic significance. 5. OM1: 0.98; low likelihood of hemodynamic significance. 6. Prox RCA: 0.95; low likelihood of hemodynamic significance. 7. Distal RCA: 0.95; low likelihood of hemodynamic significance.   IMPRESSION:   1. CT FFR analysis didn't show any significant stenosis. Medical management recommended.    Echocardiogram 05/20/2024 1. Left ventricular ejection fraction, by estimation, is 55 to 60%. The  left ventricle has normal function. There is mild concentric left  ventricular  hypertrophy. Left ventricular diastolic parameters are  consistent with Grade II diastolic dysfunction  (pseudonormalization). The average left ventricular global longitudinal  strain is -21.9 %. The global longitudinal strain is normal.   2. Right ventricular systolic function is normal. The right ventricular  size is normal. Tricuspid regurgitation signal is inadequate for assessing  PA pressure.   3. Left atrial size was mildly dilated.   4. The mitral valve is normal in structure. Mild mitral valve  regurgitation. No evidence of mitral stenosis.   5. The aortic valve was not well visualized. There is mild calcification  of the aortic valve. Aortic valve regurgitation is mild to moderate. No  aortic stenosis is present.    6. Aortic dilatation noted. There is moderate dilatation of the aortic  root, measuring 44 mm. There is mild dilatation of the ascending aorta,  measuring 43 mm.   7. The inferior vena cava is normal in size with greater than 50%  respiratory variability, suggesting right atrial pressure of 3 mmHg.     Echo 06/27/2024  1. Apical akinesis, pattern could suggest stress induced cardiomyopathy.  Cannot exlcude possible LAD ischemia. . Left ventricular ejection  fraction, by estimation, is 30 to 35%. The left ventricle has moderately  decreased function. The left ventricle  demonstrates regional wall motion abnormalities (see scoring  diagram/findings for description). There is moderate left ventricular  hypertrophy. Left ventricular diastolic parameters are consistent with  Grade II diastolic dysfunction  (pseudonormalization). Elevated left atrial pressure.   2. Right ventricular systolic function is normal. The right ventricular  size is normal.   3. The mitral valve is abnormal. Trivial mitral valve regurgitation. No  evidence of mitral stenosis.   4. The aortic valve was not well visualized. There is mild calcification  of the aortic valve. There is mild thickening of the aortic valve. Aortic  valve regurgitation is moderate.   5. The inferior vena cava is normal in size with <50% respiratory  variability, suggesting right atrial pressure of 8 mmHg.    Laboratory Data: High Sensitivity Troponin:  No results for input(s): TROPONINIHS in the last 720 hours.   Chemistry Recent Labs  Lab 06/23/24 0225 06/24/24 0740 06/26/24 0325 06/26/24 2323 06/26/24 2327 06/27/24 0443 06/27/24 0705  NA 131*   < > 133* 131* 133* 132* 132*  K 3.7   < > 4.1 4.7 4.6 4.2 4.7  CL 97*   < > 99 99 101  --  98  CO2 23   < > 24 17*  --   --  19*  GLUCOSE 132*   < > 96 160* 160*  --  170*  BUN 62*   < > 59* 56* 65*  --  59*  CREATININE 3.58*   < > 2.97* 3.08* 3.20*  --  3.36*  CALCIUM  8.3*   < >  8.6* 8.8*  --   --  8.7*  MG 2.4  --   --   --   --   --  3.2*  GFRNONAA 13*   < > 16* 16*  --   --  14*  ANIONGAP 11   < > 10 16*  --   --  15   < > = values in this interval not displayed.    Recent Labs  Lab 06/26/24 0325 06/26/24 2323 06/27/24 0705  PROT 6.3* 7.0 6.4*  ALBUMIN  3.3* 3.4* 3.2*  AST 101* 586* 5,581*  ALT 95* 276* 2,076*  ALKPHOS 108 203* 203*  BILITOT 0.6 1.2 1.2   Lipids No results for input(s): CHOL, TRIG, HDL, LABVLDL, LDLCALC, CHOLHDL in the last 168 hours.  Hematology Recent Labs  Lab 06/25/24 0209 06/26/24 2323 06/26/24 2327 06/27/24 0443 06/27/24 0705  WBC 18.9* 35.1*  --   --  26.5*  RBC 3.79* 4.10  --   --  3.89  HGB 11.2* 12.2 14.3 12.6 11.6*  HCT 34.4* 38.6 42.0 37.0 36.7  MCV 90.8 94.1  --   --  94.3  MCH 29.6 29.8  --   --  29.8  MCHC 32.6 31.6  --   --  31.6  RDW 15.6* 15.6*  --   --  15.8*  PLT 248 313  --   --  241   Thyroid  No results for input(s): TSH, FREET4 in the last 168 hours.  BNP Recent Labs  Lab 06/27/24 0002  PROBNP 8,463.0*    DDimer No results for input(s): DDIMER in the last 168 hours.  Radiology/Studies:  ECHOCARDIOGRAM COMPLETE Result Date: 06/27/2024    ECHOCARDIOGRAM REPORT   Patient Name:   JAIDALYN SCHILLO Date of Exam: 06/27/2024 Medical Rec #:  [id number]    Height:       65.0 in Accession #:    [id number]   Weight:       202.0 lb Date of Birth:  [birth year]    BSA:          1.987 m Patient Age:    68 years     BP:           105/52 mmHg Patient Gender: F            HR:           56 bpm. Exam Location:  Inpatient Procedure: 2D Echo, Cardiac Doppler, Color Doppler and Intracardiac            Opacification Agent (Both Spectral and Color Flow Doppler were            utilized during procedure). STAT ECHO Indications:    Dyspnea  History:        Patient has prior history of Echocardiogram examinations, most                 recent 05/20/2024. CHF, COPD; Risk Factors:Hypertension and                  Dyslipidemia.  Sonographer:    Philomena Daring Referring Phys: Colter Magowan F Malik Ruffino  Sonographer Comments: Patient is obese. IMPRESSIONS  1. Apical akinesis, pattern could suggest stress induced cardiomyopathy. Cannot exlcude possible LAD ischemia. . Left ventricular ejection fraction, by estimation, is 30 to 35%. The left ventricle has moderately decreased function. The left ventricle demonstrates regional wall motion abnormalities (see scoring diagram/findings for description). There is moderate left ventricular hypertrophy. Left ventricular diastolic parameters are consistent with Grade II diastolic dysfunction (pseudonormalization). Elevated left atrial pressure.  2. Right ventricular systolic function is normal. The right ventricular size is normal.  3. The mitral valve is abnormal. Trivial mitral valve regurgitation. No evidence of mitral stenosis.  4. The aortic valve was not well visualized. There is mild calcification of the aortic valve. There is mild thickening of the aortic valve. Aortic valve regurgitation is moderate.  5. The inferior vena cava is normal in size with <50% respiratory variability, suggesting right atrial pressure of 8 mmHg. FINDINGS  Left Ventricle: Apical akinesis, pattern could suggest stress induced cardiomyopathy. Cannot exlcude possible LAD ischemia. Left ventricular ejection fraction,  by estimation, is 30 to 35%. The left ventricle has moderately decreased function. The left ventricle demonstrates regional wall motion abnormalities. Definity  contrast agent was given IV to delineate the left ventricular endocardial borders. The left ventricular internal cavity size was normal in size. There is moderate left ventricular hypertrophy. Left ventricular diastolic parameters are consistent with Grade II diastolic dysfunction (pseudonormalization). Elevated left atrial pressure.  LV Wall Scoring: The mid and distal anterior septum, apical lateral segment, apical anterior segment, and apex are  akinetic. Right Ventricle: The right ventricular size is normal. Right vetricular wall thickness was not well visualized. Right ventricular systolic function is normal. Left Atrium: Left atrial size was not well visualized. Right Atrium: Right atrial size was not well visualized. Pericardium: There is no evidence of pericardial effusion. Mitral Valve: The mitral valve is abnormal. Trivial mitral valve regurgitation. No evidence of mitral valve stenosis. Tricuspid Valve: The tricuspid valve is not well visualized. Tricuspid valve regurgitation is trivial. No evidence of tricuspid stenosis. Aortic Valve: The aortic valve was not well visualized. There is mild calcification of the aortic valve. There is mild thickening of the aortic valve. There is mild aortic valve annular calcification. Aortic valve regurgitation is moderate. Aortic valve mean gradient measures 4.1 mmHg. Aortic valve peak gradient measures 10.4 mmHg. Aortic valve area, by VTI measures 2.79 cm. Pulmonic Valve: The pulmonic valve was not well visualized. Pulmonic valve regurgitation is not visualized. No evidence of pulmonic stenosis. Aorta: The aortic root was not well visualized. Venous: The inferior vena cava is normal in size with less than 50% respiratory variability, suggesting right atrial pressure of 8 mmHg. IAS/Shunts: No atrial level shunt detected by color flow Doppler.  LEFT VENTRICLE PLAX 2D LVIDd:         5.10 cm   Diastology LVIDs:         3.60 cm   LV e' medial:    5.33 cm/s LV PW:         1.20 cm   LV E/e' medial:  16.2 LV IVS:        1.30 cm   LV e' lateral:   7.83 cm/s LVOT diam:     1.90 cm   LV E/e' lateral: 11.0 LV SV:         86 LV SV Index:   43 LVOT Area:     2.84 cm  RIGHT VENTRICLE             IVC RV S prime:     16.50 cm/s  IVC diam: 2.10 cm TAPSE (M-mode): 2.2 cm LEFT ATRIUM             Index        RIGHT ATRIUM           Index LA diam:        4.10 cm 2.06 cm/m   RA Area:     16.20 cm LA Vol (A2C):   46.7 ml 23.51 ml/m   RA Volume:   35.50 ml  17.87 ml/m LA Vol (A4C):   52.6 ml 26.48 ml/m LA Biplane Vol: 51.0 ml 25.67 ml/m  AORTIC VALVE AV Area (Vmax):    2.27 cm AV Area (Vmean):   2.68 cm AV Area (VTI):     2.79 cm AV Vmax:           161.13 cm/s AV Vmean:          90.612 cm/s AV VTI:  0.309 m AV Peak Grad:      10.4 mmHg AV Mean Grad:      4.1 mmHg LVOT Vmax:         129.00 cm/s LVOT Vmean:        85.600 cm/s LVOT VTI:          0.304 m LVOT/AV VTI ratio: 0.98 MITRAL VALVE MV Area (PHT): 4.57 cm    SHUNTS MV Decel Time: 166 msec    Systemic VTI:  0.30 m MV E velocity: 86.20 cm/s  Systemic Diam: 1.90 cm MV A velocity: 59.80 cm/s MV E/A ratio:  1.44 Dorn Ross MD Electronically signed by Dorn Ross MD Signature Date/Time: 06/27/2024/9:19:25 AM    Final    CT CHEST WO CONTRAST Result Date: 06/27/2024 CLINICAL DATA:  Respiratory illness. EXAM: CT CHEST WITHOUT CONTRAST TECHNIQUE: Multidetector CT imaging of the chest was performed following the standard protocol without IV contrast. RADIATION DOSE REDUCTION: This exam was performed according to the departmental dose-optimization program which includes automated exposure control, adjustment of the mA and/or kV according to patient size and/or use of iterative reconstruction technique. COMPARISON:  Chest x-ray 1 day prior.  Chest CT 04/22/2024 FINDINGS: Cardiovascular: The heart is enlarged. No substantial pericardial effusion. Coronary artery calcification is evident. Mild atherosclerotic calcification is noted in the wall of the thoracic aorta. Ascending thoracic aorta measures 4.1 cm diameter compatible with aneurysm. Mediastinum/Nodes: No mediastinal lymphadenopathy. No evidence for gross hilar lymphadenopathy although assessment is limited by the lack of intravenous contrast on the current study. The esophagus has normal imaging features. There is no axillary lymphadenopathy. Lungs/Pleura: Interval development of patchy ground-glass opacity with  interlobular septal thickening in both upper lobes, left greater than right. Confluent areas of consolidative airspace disease are seen in a patchy distribution of the mid and lower lungs bilaterally. Trace left pleural effusion evident. Upper Abdomen: Surgical changes noted in the stomach. Musculoskeletal: No worrisome lytic or sclerotic osseous abnormality. IMPRESSION: 1. Interval development of patchy ground-glass opacity with interlobular septal thickening in both upper lobes, left greater than right. Confluent areas of consolidative airspace disease are seen in a patchy distribution of the mid and lower lungs bilaterally. Imaging features are compatible with multifocal pneumonia. 2. Trace left pleural effusion. 3.  Aortic Atherosclerosis (ICD10-I70.0). Electronically Signed   By: Camellia Candle M.D.   On: 06/27/2024 06:38   US  Abdomen Limited RUQ (LIVER/GB) Result Date: 06/27/2024 CLINICAL DATA:  Epigastric pain. EXAM: ULTRASOUND ABDOMEN LIMITED RIGHT UPPER QUADRANT COMPARISON:  Ultrasound 1 week ago 06/20/2024 FINDINGS: Gallbladder: Physiologically distended. No gallbladder wall thickening. Small gallstones. No pericholecystic fluid. No sonographic Murphy sign noted by sonographer. Common bile duct: Diameter: 4 mm, normal Liver: Diffusely increased parenchymal echogenicity. The echogenic lesion on prior exam was not delineated on the current exam. Technically limited due to difficulty with patient positioning. Portal vein is patent on color Doppler imaging with normal direction of blood flow towards the liver. Other: No right upper quadrant ascites. IMPRESSION: 1. Gallstones without sonographic findings of acute cholecystitis. 2. Increased hepatic parenchymal echogenicity typical of steatosis. 3. Previous echogenic right lobe lesion is not seen on the current exam. Electronically Signed   By: Andrea Gasman M.D.   On: 06/27/2024 01:22   CT Head Wo Contrast Result Date: 06/27/2024 EXAM: CT HEAD AND  CERVICAL SPINE 06/26/2024 11:53:00 PM TECHNIQUE: CT of the head and cervical spine was performed without the administration of intravenous contrast. Multiplanar reformatted images are provided for review. Automated exposure control,  iterative reconstruction, and/or weight based adjustment of the mA/kV was utilized to reduce the radiation dose to as low as reasonably achievable. COMPARISON: None available. CLINICAL HISTORY: Head trauma, minor (Age >= 65y) FINDINGS: CT HEAD BRAIN AND VENTRICLES: No acute intracranial hemorrhage. No mass effect or midline shift. No abnormal extra-axial fluid collection. No evidence of acute infarct. No hydrocephalus. A 1.1 cm calcified extra-axial dural based mass along the anterior falx is unchanged. ORBITS: No acute abnormality. SINUSES AND MASTOIDS: No acute abnormality. SOFT TISSUES AND SKULL: No acute skull fracture. No acute soft tissue abnormality. CT CERVICAL SPINE BONES AND ALIGNMENT: No acute fracture or traumatic malalignment. DEGENERATIVE CHANGES: No significant degenerative changes. SOFT TISSUES: No prevertebral soft tissue swelling. IMPRESSION: 1. No acute intracranial abnormality. 2. No acute fracture or traumatic malalignment of the cervical spine. 3. Unchanged small putative left parfalcine meningioma. Electronically signed by: Gilmore Molt 06/27/2024 12:03 AM EST RP Workstation: [id number]   CT Cervical Spine Wo Contrast Result Date: 06/27/2024 EXAM: CT HEAD AND CERVICAL SPINE 06/26/2024 11:53:00 PM TECHNIQUE: CT of the head and cervical spine was performed without the administration of intravenous contrast. Multiplanar reformatted images are provided for review. Automated exposure control, iterative reconstruction, and/or weight based adjustment of the mA/kV was utilized to reduce the radiation dose to as low as reasonably achievable. COMPARISON: None available. CLINICAL HISTORY: Head trauma, minor (Age >= 65y) FINDINGS: CT HEAD BRAIN AND VENTRICLES: No acute  intracranial hemorrhage. No mass effect or midline shift. No abnormal extra-axial fluid collection. No evidence of acute infarct. No hydrocephalus. A 1.1 cm calcified extra-axial dural based mass along the anterior falx is unchanged. ORBITS: No acute abnormality. SINUSES AND MASTOIDS: No acute abnormality. SOFT TISSUES AND SKULL: No acute skull fracture. No acute soft tissue abnormality. CT CERVICAL SPINE BONES AND ALIGNMENT: No acute fracture or traumatic malalignment. DEGENERATIVE CHANGES: No significant degenerative changes. SOFT TISSUES: No prevertebral soft tissue swelling. IMPRESSION: 1. No acute intracranial abnormality. 2. No acute fracture or traumatic malalignment of the cervical spine. 3. Unchanged small putative left parfalcine meningioma. Electronically signed by: Gilmore Molt 06/27/2024 12:03 AM EST RP Workstation: [id number]   DG Knee Complete 4 Views Left Result Date: 06/26/2024 CLINICAL DATA:  Trauma, fall. EXAM: LEFT KNEE - COMPLETE 4+ VIEW COMPARISON:  06/06/2022 FINDINGS: Knee arthroplasty in expected alignment. Prior patellar resurfacing. No acute or periprosthetic fracture. No periprosthetic lucency. No significant joint effusion. Mild generalized soft tissue edema. IMPRESSION: Knee arthroplasty without complication or acute fracture. Electronically Signed   By: Andrea Gasman M.D.   On: 06/26/2024 23:56   DG Foot Complete Left Result Date: 06/26/2024 CLINICAL DATA:  Trauma, fall. EXAM: LEFT FOOT - COMPLETE 3+ VIEW COMPARISON:  None Available. FINDINGS: There is no evidence of fracture or dislocation. Minor talonavicular degenerative change. Dorsal soft tissue edema. IMPRESSION: Dorsal soft tissue edema. No fracture or subluxation. Electronically Signed   By: Andrea Gasman M.D.   On: 06/26/2024 23:55   DG Knee Complete 4 Views Right Result Date: 06/26/2024 CLINICAL DATA:  Trauma, fall. EXAM: RIGHT KNEE - COMPLETE 4+ VIEW COMPARISON:  11/15/2022 FINDINGS: Knee arthroplasty in  expected alignment. Prior patellar resurfacing. No acute or periprosthetic fracture. Minimal joint effusion. Mild generalized soft tissue edema. IMPRESSION: Knee arthroplasty without complication or acute fracture. Electronically Signed   By: Andrea Gasman M.D.   On: 06/26/2024 23:54   DG Chest Port 1 View Result Date: 06/26/2024 CLINICAL DATA:  Trauma, fall. EXAM: PORTABLE CHEST 1 VIEW COMPARISON:  06/17/2024 FINDINGS: Cardiomegaly is  stable. Mediastinal contours are unchanged. Stable interstitial coarsening. Subsegmental atelectasis/scarring in the right mid lung. No pneumothorax. No large pleural effusion. On limited assessment, no acute osseous findings. Bilateral shoulder arthropathy. IMPRESSION: 1. No acute findings or evidence of traumatic injury. 2. Stable cardiomegaly. Electronically Signed   By: Andrea Gasman M.D.   On: 06/26/2024 23:53   DG Pelvis Portable Result Date: 06/26/2024 CLINICAL DATA:  Trauma, fall. EXAM: PORTABLE PELVIS 1-2 VIEWS COMPARISON:  06/16/2024 FINDINGS: Right hip arthroplasty in expected alignment. No acute or periprosthetic fracture. Pubic rami are intact. No pubic symphyseal or sacroiliac diastasis. Left hip osteoarthritis. IMPRESSION: Right hip arthroplasty without complication or acute fracture. Electronically Signed   By: Andrea Gasman M.D.   On: 06/26/2024 23:52     Assessment and Plan: Elevated troponin: Serial troponin 157--> 323.  Patient is unable to tell me whether or not she has been having chest pain.  Fortunately, coronary CT obtained on 04/28/2024 showed mild to moderate nonobstructive CAD with negative FFR.  Echocardiogram showed a newly decreased EF of 30 to 35% with wall motion abnormality concerning for Takotsubo cardiomyopathy.  Her creatinine of 3.3 preclude possibility of doing any invasive study.  Her blood pressure is too low to titrate heart failure therapy.  Will focus on treating possible septic shock first.  LV dysfunction: See #1.   Does not appears to be significantly volume overloaded.  She does have pedal edema, however she is also morbidly obese, this is likely dependent edema.  There was no pretibial pitting edema.  Initial chest x-ray did not show acute finding, subsequent CT of the chest however was concerning for multifocal pneumonia.  Likely septic shock: Arrived with lactic acid of 4.1.  Lactic acid improved to 1.8.  Patient tested positive for flu A and many bacteria on urine analysis.  She recently just finished a 7-day course of Rocephin  on 06/22/2024 during the last admission.  CT of the chest was concerning for multifocal pneumonia.  Patient has been placed on antibiotic and Tamiflu .  Echocardiogram does show a drop in the ejection fraction.  However recent coronary CT on 04/28/2024 showed mild to moderate nonobstructive mixed density plaque in the LAD territory.  Elevated transaminase: AST increased to 5581 and ALT increased to 2076.  Likely related to shock.  Acute on chronic renal insufficiency: Previous baseline creatinine was 1.2-1.3 back in October 2025.  Patient was admitted for close to 2 weeks in December 2025 due to left pyelonephritis, imaging at the time also showed atrophic right kidney.  Creatinine peaked at 3.62 during the hospitalization however came down to 3.2 on the day of discharge.  She quickly bounced back to the ED the night of the discharge, initial creatinine was 2.97, this morning it went up to 3.36.  This is likely related to shock.  Atrial fibrillation: On Eliquis  at home.  Maintaining sinus rhythm based on EKG.  No longer on amiodarone  or beta-blocker due to bradycardia.  Thoracic aortic aneurysm: History of dissection, however healed on repeat imaging.  Last seen Dr. Melda in January 2025 who mentioned that there was a small penetrating ulcer around 6 o'clock position in the mid ascending aorta.  This is stable when compared to the previous scans.  hyperlipidemia: Hold home Crestor  due to  elevated transaminase   Risk Assessment/Risk Scores:         CHA2DS2-VASc Score = 4   This indicates a 4.8% annual risk of stroke. The patient's score is based upon: CHF History: 1  HTN History: 1 Diabetes History: 0 Stroke History: 0 Vascular Disease History: 0 Age Score: 1 Gender Score: 1        For questions or updates, please contact [facility] Please consult [email or web address] for contact info under    Bonney Scot Ford, [place]  06/27/2024 9:39 AM  Attending Note  Patient seen and discussed with PA Ford, I agree with his documentation. 68 yo female history of persistent afib, chronic HFpEF, aortic aneurysm with dissection not surgical candidate with plans for palliative referral, chronic respiratory failure/COPD on home O2 2L, admitted with SOB Just discharged 12/24. Managed for AKI on CKD, pyelonephritis.     K 4.7 BUN 56 Cr 3.08 WBC 35.1 Hgbn 12.2 Plt 313 INR 2.4 Lactic acid 4.1-->3.3 -->1.8 Pro BNP 8400 -->32,000 Trop 157 -->323 -->365 ABG 01/03/34/81/18 Flu A + EKG SR, no acute ischemic changes CXR: no acute process CT chest: Interval development of patchy ground-glass opacity with interlobular septal thickening in both upper lobes, left greater than right. Confluent areas of consolidative airspace disease are seen in a patchy distribution of the mid and lower lungs bilaterally. Imaging features are compatible with multifocal pneumonia.   06/2024 echo: LVEF 35%, apical akinesis    1.Shock WBC 35.1, lactic acid 4.1 Procalc pending - likely septic shock secondary to pneumonia, she is influenza +. Also UA +leuko, many bacteria - echo LVEF 35%, apical akinesis. May be a cardiac component to her shock. Have recommend central line placement to obtain cooximetry. CVP would also be beneficial to determine degree volume status may be playing a role in her hypoxia vs influenza/multifocal pneumonia alone.  -MAPs borderline, has not required pressors as of  yest.     2.Acute HFrEF - 05/2024 echo: LVEF 55-60% -06/2024 echo: LVEF 35%, apical akinesis - Pro BNP 8400 -->32,000 - most likely stress induced CM/Takotsubo CM. Cannot exclude LAD ischemia - fairly mild trop all things considered, EKG benign. Overall does not appear to be consistent with an acute anterior infarct as etiology of findings. Presents with influenza, septic shock, severe hypoxia.  - not a cath candidate given renal failure, coagulatopathy INR 2.4  -04/2024 coronary CTA: mild to mod RCA disease, mild LAD disease, minimal LCX disease - medical therapy limited by hypotension, renal failure  - f/u coox and CVP once line placed. Pending state as well as MAP trends decide if any IV drips are indicated.  - some signs of fluid overload, with borderline MAPs would not diurese. Await line placement, may consider mild diuresis pending MAPs or if on pressor to maintain MAPs with diuresis.    3.Afib - off amio and beta blocker due to bradycardia.  - on eliquis  for stroke prevention - SR this admission  4. CKD - significant AKI earlier this month during admission that never resolved. Cr in October 1.2, more recently 3 to 3.2  5. Acute liver injury - AST 5500, ALT 2000 - likely related to sepsis, shock.   6. Coagulopathy - INR 2.4  7. Respiratory failure - on and off bipap in setting of influenza, volume overload.    Dorn Ross MD    [1]  Allergies Allergen Reactions   Crestor  [Rosuvastatin  Calcium ] Other (See Comments)    Muscle aches     Hctz [Hydrochlorothiazide] Other (See Comments)    Pt doesn't remember   Lipitor [Atorvastatin] Other (See Comments)    Muscle aches    "

## 2024-06-27 NOTE — Consult Note (Signed)
 "  NAME:  Lauren Clark, MRN:  [id number], DOB:  11/09/1955, LOS: 0 ADMISSION DATE:  06/26/2024, CONSULTATION DATE:  12/25 REFERRING MD:  Dr. Cosette, CHIEF COMPLAINT:  SOB  History of Present Illness:   HPI taken from EMR review as history limited as patient is on BiPAP.  68 yoF with PMH of hypertension, hyperlipidemia, hypothyroidism, CKD 3A, COPD, chronic hypoxic respiratory failure on 2L [place], CAD, atrial fibrillation on Eliquis , chronic HFpEF, hypothyroidism, mood disorder, and ascending aortic aneurysm.  Recent hospitalization 12/13-12/24 for sepsis, pyelonephritis, AKI, ileus, mildly elevated LFTs.  Returned overnight after fall at home on the floor for ~2hrs and SOB found to be hypoxic in the 80's on her 2L.    Labs also showed worsening WBC 18.9> 35, Na 131, BUN/ sCr 56/ 3.08> 59/ 3.36, AST/ ALT 586/ 276> 5581/ 2076, normal t.bili, pBNP 32,843 (previously 8463), trop 323> 365 (previously 157), CK 894, lactic 3.3 since cleared to 1.8 after fluids, and worsening PCT 2.53> 16.  Found to be Flu A positive.   UA showing large amount of leukocytes, 21-50 RBCs, 21-50 WBCs, CXR negative, EKG non acute but noted prolonged Qtc.  CTH neg, CT c/a/p showed interval development of patchy ground glass opacity with interlobar septal thickening in both upper lobes, left greater than right, this was concerning for multifocal pneumonia.  Was placed on BiPAP for work of breathing.  Started on vancomycin  and zosyn , treated with solumedrol, nebs, mag.  Cardiology consulted, stat TTE showed EF 30 to 35%, cannot exclude possible LAD ischemia however with apical akinesis, this could suggest stress-induced cardiomyopathy, grade 2 DD, trivial MR.  EF previously 55-60% in November.  Heparin  IV ordered.  Was given lasix  with brief SBP in the 80's since improved to SBP 110-120s.  Pt remains on BIPAP, currently denying CP, oriented to self and person and following simple commands.  ABG 7.318/ 35/ 81/ 18.2.  Given concern for  developing septic and possible cardiogenic shock, PCCM consulted.  Pt is DNR/ DNI per previous chart review.   Pertinent  Medical History   Past Medical History:  Diagnosis Date   Anemia    s/p knee surgery   Anxiety    Aortic dissection (HCC)    Arthritis    COPD (chronic obstructive pulmonary disease) (HCC) 06/15/2024   Depression    GERD (gastroesophageal reflux disease)    Glaucoma    right eye uses drops   History of blood transfusion    [facility]   History of MRSA infection    Hyperlipidemia    Hypertension    Hypothyroidism    Impaired vision in both eyes    blind left eye, limited vision right eye   Morbid obesity (HCC)    s/p Gastric sleeve- 8'17(loss thus far approx. 100 lbs)   Peripheral vascular disease    dvt right leg   Pre-diabetes    prior to gastric sleeve   Significant Hospital Events: Including procedures, antibiotic start and stop dates in addition to other pertinent events   12/13-12/24 for sepsis, pyelonephritis, AKI, ileus, mildly elevated LFTs 12/25 admitted for hypoxic respiratory failure on BIPAP, flu A +, AoC HF exacerbation/ sepsis, AKI, elevated LFTs  Interim History / Subjective:   Objective    Blood pressure (!) 114/57, pulse (!) 56, temperature 97.7 F (36.5 C), temperature source Axillary, resp. rate 19, height 5' 5 (1.651 m), weight 91.6 kg, SpO2 100%.    FiO2 (%):  [50 %-60 %]  50 % PEEP:  [8 cmH20] 8 cmH20 Pressure Support:  [14 cmH20] 14 cmH20  No intake or output data in the 24 hours ending 06/27/24 1302 Filed Weights   06/26/24 2317  Weight: 91.6 kg    Examination: General:  chronically ill appearing obese elderly female in NAD on BIPAP HEENT: full face mask Neuro: awakens to verbal, oriented to name, place, MAE- follows simple commands CV: SB/ SR, no murmur, warm extremities PULM:  non labored on BiPAP with good volumes, clear/ diminished in bases GI: soft, bs+, NT Extremities: warm/dry,  no tibial edema, +1 ankle Skin: no rashes   Resolved problem list   Assessment and Plan   Acute on chronic respiratory hypoxic respiratory failure - likely multifactorial in setting of  Flu A positive, multifocal vs viral PNA, and AoC HF Severe sepsis  R/o UTI Acute on chronic systolic HF Elevated troponin, concern for ACS Afib on Eliquis - currently SB/ SR Qtc prolongation  Transaminitis AKI on CKD NAGMA Hyponatremia  Fall- recurrent HLD Hypothyroidism GERD P:  - discussed with attending.  At this time, pt is stable to admit to PCU as oxygenation/resp status stable on BiPAP with improved hemodynamics.  Suspect this is combined HF exacerbation and recurrent sepsis however given body habitus, fluid status is not obvious. Doubt PE given has been on eliquis . Given prior documented GOC- DNR/ DNI, non invasive medical therapies, would defer CVL for now to check coox/ CVP, as no family at bedside and pt is not able to make further decisions.  PMT has been consulted.  - recheck lactic - cont BiPAP, likely can transition off soon, use PRN  - goal sat > 90% - follow cultures, cont broad spectrum abx - cards following, empiric heparin  given concern for ACS.  Not a candidate for cath given renal function at this time - remainder per primary team  PCCM will see again 12/26, please reach out if needed sooner.    Labs   CBC: Recent Labs  Lab 06/23/24 0225 06/25/24 0209 06/26/24 2323 06/26/24 2327 06/27/24 0443 06/27/24 0705  WBC 21.3* 18.9* 35.1*  --   --  26.5*  NEUTROABS  --  15.7*  --   --   --   --   HGB 11.8* 11.2* 12.2 14.3 12.6 11.6*  HCT 36.9 34.4* 38.6 42.0 37.0 36.7  MCV 91.1 90.8 94.1  --   --  94.3  PLT 236 248 313  --   --  241    Basic Metabolic Panel: Recent Labs  Lab 06/23/24 0225 06/24/24 0740 06/25/24 0209 06/26/24 0325 06/26/24 2323 06/26/24 2327 06/27/24 0443 06/27/24 0705 06/27/24 1138  NA 131* 131* 132* 133* 131* 133* 132* 132*  --   K 3.7 4.4  3.8 4.1 4.7 4.6 4.2 4.7  --   CL 97* 98 98 99 99 101  --  98  --   CO2 23 21* 22 24 17*  --   --  19*  --   GLUCOSE 132* 128* 112* 96 160* 160*  --  170*  --   BUN 62* 65* 69* 59* 56* 65*  --  59*  --   CREATININE 3.58* 3.39* 3.20* 2.97* 3.08* 3.20*  --  3.36*  --   CALCIUM  8.3* 8.6* 8.4* 8.6* 8.8*  --   --  8.7*  --   MG 2.4  --   --   --   --   --   --  3.2* 3.1*  PHOS  --  4.1  --   --   --   --   --   --  5.7*   GFR: Estimated Creatinine Clearance: 17.7 mL/min (A) (by C-G formula based on SCr of 3.36 mg/dL (H)). Recent Labs  Lab 06/23/24 0225 06/25/24 0209 06/26/24 2323 06/26/24 2328 06/27/24 0413 06/27/24 0705 06/27/24 1138  PROCALCITON  --   --   --   --   --   --  16.00  WBC 21.3* 18.9* 35.1*  --   --  26.5*  --   LATICACIDVEN  --   --   --  4.1* 3.3* 1.8  --     Liver Function Tests: Recent Labs  Lab 06/23/24 0225 06/24/24 0740 06/25/24 0209 06/26/24 0325 06/26/24 2323 06/27/24 0705  AST 90*  --  89* 101* 586* 5,581*  ALT 87*  --  90* 95* 276* 2,076*  ALKPHOS 149*  --  114 108 203* 203*  BILITOT 0.4  --  0.4 0.6 1.2 1.2  PROT 5.8*  --  6.2* 6.3* 7.0 6.4*  ALBUMIN  2.3* 2.5* 2.8* 3.3* 3.4* 3.2*   No results for input(s): LIPASE, AMYLASE in the last 168 hours. No results for input(s): AMMONIA in the last 168 hours.  ABG    Component Value Date/Time   PHART 7.318 (L) 06/27/2024 0443   PCO2ART 35.4 06/27/2024 0443   PO2ART 81 (L) 06/27/2024 0443   HCO3 18.2 (L) 06/27/2024 0443   TCO2 19 (L) 06/27/2024 0443   ACIDBASEDEF 7.0 (H) 06/27/2024 0443   O2SAT 95 06/27/2024 0443     Coagulation Profile: Recent Labs  Lab 06/26/24 2323  INR 2.4*    Cardiac Enzymes: Recent Labs  Lab 06/27/24 0002 06/27/24 0705  CKTOTAL 385* 894*    HbA1C: Hgb A1c MFr Bld  Date/Time Value Ref Range Status  11/14/2022 09:27 AM 5.3 4.8 - 5.6 % Final    Comment:    (NOTE) Pre diabetes:          5.7%-6.4%  Diabetes:              >6.4%  Glycemic control for    <7.0% adults with diabetes   06/08/2016 10:37 AM 5.4 4.8 - 5.6 % Final    Comment:    (NOTE)         Pre-diabetes: 5.7 - 6.4         Diabetes: >6.4         Glycemic control for adults with diabetes: <7.0     CBG: Recent Labs  Lab 06/26/24 0425  GLUCAP 97    Review of Systems:   Unable  Past Medical History:  She,  has a past medical history of Anemia, Anxiety, Aortic dissection (HCC), Arthritis, COPD (chronic obstructive pulmonary disease) (HCC) (06/15/2024), Depression, GERD (gastroesophageal reflux disease), Glaucoma, History of blood transfusion, History of MRSA infection, Hyperlipidemia, Hypertension, Hypothyroidism, Impaired vision in both eyes, Morbid obesity (HCC), Peripheral vascular disease, and Pre-diabetes.   Surgical History:   Past Surgical History:  Procedure Laterality Date   ABDOMINAL HYSTERECTOMY  1981   with bilateral BSO   BREAST BIOPSY Right 10/18/2023   MM RT BREAST BX W LOC DEV 1ST LESION IMAGE BX SPEC STEREO GUIDE 10/18/2023 [facility]   CATARACT EXTRACTION Right 2010   [facility]   COLONOSCOPY     DEBRIDEMENT AND CLOSURE WOUND Right 07/07/2014   Dr. Renato   EYE SURGERY     bil cataraction with IOL as a child, cataract surgery  late 1990- right   JOINT REPLACEMENT Right 2016   knee   KNEE DEBRIDEMENT Right 06/22/2014   Dr. Reyes Boer with wound vac   KNEE SURGERY     i&d KNEE POST REPLACEMENT   LAPAROSCOPIC GASTRIC SLEEVE RESECTION N/A 02/29/2016   Procedure: LAPAROSCOPIC GASTRIC SLEEVE RESECTION, UPPER ENDO;  Surgeon: Camellia Blush, MD;  Location: [facility];  Service: General;  Laterality: N/A;   TONSILLECTOMY     TOTAL HIP ARTHROPLASTY Right 06/14/2016   Procedure: RIGHT TOTAL HIP ARTHROPLASTY ANTERIOR APPROACH;  Surgeon: Donnice Car, MD;  Location: [facility];  Service: Orthopedics;  Laterality: Right;   TOTAL KNEE ARTHROPLASTY Left 09/05/2017   Procedure: LEFT TOTAL KNEE ARTHROPLASTY;  Surgeon: Car Donnice, MD;  Location: [facility];  Service: Orthopedics;  Laterality: Left;  Adductor Block   UPPER GI ENDOSCOPY  01/25/2016     Social History:   reports that she has never smoked. She has never used smokeless tobacco. She reports that she does not drink alcohol and does not use drugs.   Family History:  Her family history includes Depression in her mother; Hypertension in her father. There is no history of Breast cancer.   Allergies Allergies[1]   Home Medications  Prior to Admission medications  Medication Sig Start Date End Date Taking? Authorizing Provider  BELSOMRA  10 MG TABS Take 1 tablet by mouth at bedtime. 11/03/23   [provider]  brimonidine -timolol  (COMBIGAN ) 0.2-0.5 % ophthalmic solution Place 1 drop into the right eye 2 (two) times daily. 06/10/24   [provider]  CALCIUM  CITRATE PO Take 600 mg by mouth daily.    [provider]  Cyanocobalamin  (VITAMIN B12 PO) Take 1 tablet by mouth in the morning.    [provider]  ELIQUIS  5 MG TABS tablet TAKE 1 TABLET(5 MG) BY MOUTH TWICE DAILY 02/09/24   Hilty, Vinie BROCKS, MD  [Paused] empagliflozin (JARDIANCE) 10 MG TABS tablet Take 10 mg by mouth daily. Wait to take this until: June 28, 2024    [provider]  FLUoxetine  (PROZAC ) 40 MG capsule Take 40 mg by mouth daily. 08/29/22   [provider]  furosemide  (LASIX ) 40 MG tablet Take 1 tablet (40 mg total) by mouth daily. 04/08/24   Hilty, Vinie BROCKS, MD  latanoprost  (XALATAN ) 0.005 % ophthalmic solution Place 1 drop into the right eye at bedtime.     [provider]  levothyroxine  (SYNTHROID ) 112 MCG tablet Take 112 mcg by mouth every morning. 09/08/23   [provider]  magnesium  oxide (MAG-OX) 400 (240 Mg) MG tablet Take 400 mg by mouth daily.    [provider]  NON FORMULARY Take 1 tablet by mouth 2 (two) times daily. Cranberry conascorib acid    [provider]  Omega 3-6-9 Fatty Acids (TRIPLE OMEGA-3-6-9 PO) Take 1  capsule by mouth daily.    [provider]  rosuvastatin  (CRESTOR ) 40 MG tablet Take 40 mg by mouth in the morning. 08/01/23   [provider]  Skin Protectants, Misc. (MINERIN CREME EX) Apply 1 application  topically daily.    [provider]     Critical care time: n/a      Lyle Pesa, NP [facility] 06/27/2024, 1:02 PM  See Amion for pager If no response to pager , please call [phone] until 7pm After 7:00 pm call Elink  [phone]           [1]  Allergies Allergen Reactions  Crestor  [Rosuvastatin  Calcium ] Other (See Comments)    Muscle aches     Hctz [Hydrochlorothiazide] Other (See Comments)    Pt doesn't remember   Lipitor [Atorvastatin] Other (See Comments)    Muscle aches    "

## 2024-06-27 NOTE — Consult Note (Addendum)
 "                                                                                   Consultation Note Date: 06/27/2024   Patient Name: Lauren Clark  DOB: 05/31/1956  MRN: [id number]  Age / Sex: 68 y.o., female  PCP: [name], MD Referring Physician: Cosette [place], MD  Reason for Consultation: Establishing goals of care  HPI/Patient Profile: 68 y.o. female   admitted on 06/26/2024 with past medical history significant for morbid obesity status post gastric sleeve, history of DVT, A-fib, CHF, hypertension, thoracic aortic aneurysm, hypothyroidism, nonobstructive CAD  vision impairment admitted for evaluation of elevated troponin.   Recent hospitalization 06/15/2024 to 06/16/2024 for AKI, pyelonephritis, mildly elevated LFTs, and acute on chronic hypoxic respiratory failure  Admitted for treatment and stabilization.  Patient with elevated LFTs, elevated troponin, COPD exacerbation in the setting of influenza A  Patient faces treatment option decisions, advanced directive decisions and anticipatory care needs.    Clinical Assessment and Goals of Care:  This NP Ronal Plants reviewed medical records, received report from team, assessed the patient and then meet at the patient's bedside  to discuss diagnosis, prognosis, GOC, EOL wishes disposition and options.      Patient was successfully taken off her BiPAP and was able to have clear conversation with me.  She is alert and oriented x 3.   Concept of Palliative Care was introduced as specialized medical care for people and their families living with serious illness.  If focuses on providing relief from the symptoms and stress of a serious illness.  The goal is to improve quality of life for both the patient and the family.  Values and goals of care important to patient and family were attempted to be elicited.  Patient understands the seriousness of her multiple comorbidities and appreciates the fact that she has had continued physical  and functional decline over the past many weeks.  She lives alone with her cat Ellouise Bihari.  Has much support from her friend Lisa Fullington and Lisa's husband .    A  discussion was had today regarding advanced directives.  Concepts specific to code status, artifical feeding and hydration, continued IV antibiotics and rehospitalization was had.    The difference between a aggressive medical intervention path  and a palliative comfort care path for this patient at this time was had.          For now patient wants to treat the treatable, and hope for improvement.  She recognizes that ongoing discussion will have to be had regarding increasing/anticipatory  care needs into the future, she lives alone.   I spoke to the patient's friend and main support/ Olam by telephone regarding all above topics.     Questions and concerns addressed.  Patient  encouraged to call with questions or concerns.     PMT will continue to support holistically.           Follow-up goals of care meeting is planned for Monday at 1 PM with patient and main support person Olam.       No documented H POA or advance care  planning documents.  However today patient clearly verbalizes that she would want her friend and main support person Lisa Fullington   Also listed in her contacts is a relative by the name of Sayana Salley, I was able to speak to [place] who is remotely connected to patient from a previous marriage of about 40 years ago.  Particia tells me that her relationship to the patient is very loose and she too believes that her friend Lisa Fullington is the best person to speak on behalf of patient, in the event that the patient cannot speak for herself.   I spoke to both patient and her friend Olam and strongly encouraged documentation of H POA and advance care planning documents during this hospital stay.  I will f/u with them once patient stabilizes    SUMMARY OF RECOMMENDATIONS    Code Status/Advance Care  Planning: DNR/DNI-Limited   Symptom Management:  Per attending   Palliative Prophylaxis:  Delirium Protocol and Frequent Pain Assessment  Additional Recommendations (Limitations, Scope, Preferences): Avoid Hospitalization  Psycho-social/Spiritual:  Desire for further Chaplaincy support:no- patient is Buddhist Additional Recommendations: Education on Hospice  Prognosis:  Unable to determine  Discharge Planning: To Be Determined      Primary Diagnoses: Present on Admission:  Acute on chronic respiratory failure with hypoxemia (HCC)  AKI (acute kidney injury)   I have reviewed the medical record, interviewed the patient and family, and examined the patient. The following aspects are pertinent.  Past Medical History:  Diagnosis Date   Anemia    s/p knee surgery   Anxiety    Aortic dissection (HCC)    Arthritis    COPD (chronic obstructive pulmonary disease) (HCC) 06/15/2024   Depression    GERD (gastroesophageal reflux disease)    Glaucoma    right eye uses drops   History of blood transfusion    [facility]   History of MRSA infection    Hyperlipidemia    Hypertension    Hypothyroidism    Impaired vision in both eyes    blind left eye, limited vision right eye   Morbid obesity (HCC)    s/p Gastric sleeve- 8'17(loss thus far approx. 100 lbs)   Peripheral vascular disease    dvt right leg   Pre-diabetes    prior to gastric sleeve   Social History   Socioeconomic History   Marital status: Widowed    Spouse name: Not on file   Number of children: Not on file   Years of education: 0   Highest education level: Not on file  Occupational History   Occupation: former public relations account executive    Comment: [employer]  Tobacco Use   Smoking status: Never   Smokeless tobacco: Never  Vaping Use   Vaping status: Never Used  Substance and Sexual Activity   Alcohol use: No   Drug use: No   Sexual activity: Not Currently    Birth  control/protection: Surgical  Other Topics Concern   Not on file  Social History Narrative   epworth sleepiness scale = 2 (02/19/16)   Social Drivers of Health   Tobacco Use: Low Risk (06/15/2024)   Patient History    Smoking Tobacco Use: Never    Smokeless Tobacco Use: Never    Passive Exposure: Not on file  Financial Resource Strain: Not on file  Food Insecurity: No Food Insecurity (06/15/2024)   Epic    Worried About Radiation Protection Practitioner of Food in the Last Year: Never true  Ran Out of Food in the Last Year: Never true  Transportation Needs: No Transportation Needs (06/15/2024)   Epic    Lack of Transportation (Medical): No    Lack of Transportation (Non-Medical): No  Physical Activity: Not on file  Stress: Not on file  Social Connections: Moderately Integrated (06/15/2024)   Social Connection and Isolation Panel    Frequency of Communication with Friends and Family: Twice a week    Frequency of Social Gatherings with Friends and Family: Once a week    Attends Religious Services: More than 4 times per year    Active Member of [employer] or Organizations: Yes    Attends Banker Meetings: 1 to 4 times per year    Marital Status: Widowed  Depression (PHQ2-9): Not on file  Alcohol Screen: Not on file  Housing: Low Risk (06/15/2024)   Epic    Unable to Pay for Housing in the Last Year: No    Number of Times Moved in the Last Year: 0    Homeless in the Last Year: No  Utilities: Not At Risk (06/15/2024)   Epic    Threatened with loss of utilities: No  Health Literacy: Not on file   Family History  Problem Relation Age of Onset   Depression Mother    Hypertension Father    Breast cancer Neg Hx    Scheduled Meds:  ipratropium-albuterol   3 mL Nebulization Q8H   methylPREDNISolone  (SOLU-MEDROL ) injection  80 mg Intravenous Daily   oseltamivir   30 mg Oral Once   vancomycin  variable dose per unstable renal function (pharmacist dosing)   Does not apply See admin  instructions   Continuous Infusions:  heparin  950 Units/hr (06/27/24 0804)   piperacillin -tazobactam (ZOSYN )  IV     PRN Meds:.albuterol , perflutren  lipid microspheres (DEFINITY ) IV suspension Medications Prior to Admission:  Prior to Admission medications  Medication Sig Start Date End Date Taking? Authorizing Provider  BELSOMRA  10 MG TABS Take 1 tablet by mouth at bedtime. 11/03/23   [provider]  brimonidine -timolol  (COMBIGAN ) 0.2-0.5 % ophthalmic solution Place 1 drop into the right eye 2 (two) times daily. 06/10/24   [provider]  CALCIUM  CITRATE PO Take 600 mg by mouth daily.    [provider]  Cyanocobalamin  (VITAMIN B12 PO) Take 1 tablet by mouth in the morning.    [provider]  ELIQUIS  5 MG TABS tablet TAKE 1 TABLET(5 MG) BY MOUTH TWICE DAILY 02/09/24   Hilty, Vinie BROCKS, MD  [Paused] empagliflozin (JARDIANCE) 10 MG TABS tablet Take 10 mg by mouth daily. Wait to take this until: June 28, 2024    [provider]  FLUoxetine  (PROZAC ) 40 MG capsule Take 40 mg by mouth daily. 08/29/22   [provider]  furosemide  (LASIX ) 40 MG tablet Take 1 tablet (40 mg total) by mouth daily. 04/08/24   Hilty, Vinie BROCKS, MD  latanoprost  (XALATAN ) 0.005 % ophthalmic solution Place 1 drop into the right eye at bedtime.     [provider]  levothyroxine  (SYNTHROID ) 112 MCG tablet Take 112 mcg by mouth every morning. 09/08/23   [provider]  magnesium  oxide (MAG-OX) 400 (240 Mg) MG tablet Take 400 mg by mouth daily.    [provider]  NON FORMULARY Take 1 tablet by mouth 2 (two) times daily. Cranberry conascorib acid    [provider]  Omega 3-6-9 Fatty Acids (TRIPLE OMEGA-3-6-9 PO) Take 1 capsule by mouth daily.    [provider]  rosuvastatin  (CRESTOR ) 40 MG tablet Take 40 mg by mouth in the morning. 08/01/23   [provider]  Skin Protectants, Misc. (MINERIN CREME EX) Apply 1  application  topically daily.    [provider]   Allergies[1] Review of Systems  Neurological:  Positive for weakness.    Physical Exam Cardiovascular:     Rate and Rhythm: Normal rate.  Pulmonary:     Comments: BiPAP removed and patient comfortable on nasal cannula at this time. Musculoskeletal:     Comments: Generalized weakness and muscle atrophy  Skin:    General: Skin is warm and dry.  Neurological:     Mental Status: She is alert and oriented to person, place, and time.     Vital Signs: BP (!) 126/105   Pulse (!) 59   Temp 98.2 F (36.8 C) (Axillary)   Resp 20   Ht 5' 5 (1.651 m)   Wt 91.6 kg   SpO2 100%   BMI 33.61 kg/m  Pain Scale: 0-10   Pain Score: 6    SpO2: SpO2: 100 % O2 Device:SpO2: 100 % O2 Flow Rate: .O2 Flow Rate (L/min): 4.5 L/min  IO: Intake/output summary: No intake or output data in the 24 hours ending 06/27/24 1059  LBM:   Baseline Weight: Weight: 91.6 kg Most recent weight: Weight: 91.6 kg     Palliative Assessment/Data: 40% at best   Discussed with Dr.Tariq secure chat and treatment team  Time:  75 minutes Signed by: Ronal Plants, NP   Please contact Palliative Medicine Team phone at [phone] for questions and concerns.  For individual provider: See Amion                 [1]  Allergies Allergen Reactions   Crestor  [Rosuvastatin  Calcium ] Other (See Comments)    Muscle aches     Hctz [Hydrochlorothiazide] Other (See Comments)    Pt doesn't remember   Lipitor [Atorvastatin] Other (See Comments)    Muscle aches    "

## 2024-06-27 NOTE — Progress Notes (Signed)
 Peripherally Inserted Central Catheter Placement  The IV Nurse has discussed with the patient and/or persons authorized to consent for the patient, the purpose of this procedure and the potential benefits and risks involved with this procedure.  The benefits include less needle sticks, lab draws from the catheter, and the patient may be discharged home with the catheter. Risks include, but not limited to, infection, bleeding, blood clot (thrombus formation), and puncture of an artery; nerve damage and irregular heartbeat and possibility to perform a PICC exchange if needed/ordered by physician.  Alternatives to this procedure were also discussed.  Bard Power PICC patient education guide, fact sheet on infection prevention and patient information card has been provided to patient /or left at bedside.    PICC Placement Documentation  PICC Double Lumen 06/27/24 Right Basilic 38 cm 0 cm (Active)  Indication for Insertion or Continuance of Line Chronic illness with exacerbations (CF, Sickle Cell, etc.) 06/27/24 1914  Exposed Catheter (cm) 0 cm 06/27/24 1914  Site Assessment Clean;Dry;Bruised 06/27/24 1914  Lumen #1 Status Saline locked;Flushed;Blood return noted 06/27/24 1914  Lumen #2 Status Saline locked;Flushed;Blood return noted 06/27/24 1914  Dressing Type Transparent;Securing device 06/27/24 1914  Dressing Status Antimicrobial disc/dressing in place;Clean, Dry, Intact 06/27/24 1914  Line Care Connections checked and tightened 06/27/24 1914  Line Adjustment (NICU/IV Team Only) No 06/27/24 1914  Dressing Intervention New dressing 06/27/24 1914  Dressing Change Due 07/04/24 06/27/24 1914    Verbal consent obtained per patient request due to visual impairment. Witnessed by [name], RN.   Lauren Clark 06/27/2024, 7:15 PM

## 2024-06-28 ENCOUNTER — Inpatient Hospital Stay (HOSPITAL_COMMUNITY)

## 2024-06-28 ENCOUNTER — Encounter (HOSPITAL_COMMUNITY): Payer: Self-pay | Admitting: Internal Medicine

## 2024-06-28 ENCOUNTER — Other Ambulatory Visit: Payer: Self-pay

## 2024-06-28 DIAGNOSIS — I5023 Acute on chronic systolic (congestive) heart failure: Secondary | ICD-10-CM | POA: Diagnosis not present

## 2024-06-28 DIAGNOSIS — J1000 Influenza due to other identified influenza virus with unspecified type of pneumonia: Secondary | ICD-10-CM

## 2024-06-28 DIAGNOSIS — N189 Chronic kidney disease, unspecified: Secondary | ICD-10-CM | POA: Diagnosis not present

## 2024-06-28 DIAGNOSIS — R6520 Severe sepsis without septic shock: Secondary | ICD-10-CM | POA: Diagnosis not present

## 2024-06-28 DIAGNOSIS — I5021 Acute systolic (congestive) heart failure: Secondary | ICD-10-CM | POA: Diagnosis not present

## 2024-06-28 DIAGNOSIS — N179 Acute kidney failure, unspecified: Secondary | ICD-10-CM | POA: Diagnosis not present

## 2024-06-28 DIAGNOSIS — J9621 Acute and chronic respiratory failure with hypoxia: Secondary | ICD-10-CM | POA: Diagnosis not present

## 2024-06-28 DIAGNOSIS — R7989 Other specified abnormal findings of blood chemistry: Secondary | ICD-10-CM | POA: Diagnosis not present

## 2024-06-28 DIAGNOSIS — J101 Influenza due to other identified influenza virus with other respiratory manifestations: Secondary | ICD-10-CM | POA: Diagnosis not present

## 2024-06-28 LAB — CBC WITH DIFFERENTIAL/PLATELET
Abs Immature Granulocytes: 0.37 K/uL — ABNORMAL HIGH (ref 0.00–0.07)
Basophils Absolute: 0.1 K/uL (ref 0.0–0.1)
Basophils Relative: 0 %
Eosinophils Absolute: 0 K/uL (ref 0.0–0.5)
Eosinophils Relative: 0 %
HCT: 30.8 % — ABNORMAL LOW (ref 36.0–46.0)
Hemoglobin: 10.5 g/dL — ABNORMAL LOW (ref 12.0–15.0)
Immature Granulocytes: 1 %
Lymphocytes Relative: 2 %
Lymphs Abs: 0.7 K/uL (ref 0.7–4.0)
MCH: 30.7 pg (ref 26.0–34.0)
MCHC: 34.1 g/dL (ref 30.0–36.0)
MCV: 90.1 fL (ref 80.0–100.0)
Monocytes Absolute: 0.5 K/uL (ref 0.1–1.0)
Monocytes Relative: 2 %
Neutro Abs: 28.8 K/uL — ABNORMAL HIGH (ref 1.7–7.7)
Neutrophils Relative %: 95 %
Platelets: 260 K/uL (ref 150–400)
RBC: 3.42 MIL/uL — ABNORMAL LOW (ref 3.87–5.11)
RDW: 15.8 % — ABNORMAL HIGH (ref 11.5–15.5)
Smear Review: NORMAL
WBC: 30.4 K/uL — ABNORMAL HIGH (ref 4.0–10.5)
nRBC: 0 % (ref 0.0–0.2)

## 2024-06-28 LAB — HEPATIC FUNCTION PANEL
ALT: 2637 U/L — ABNORMAL HIGH (ref 0–44)
AST: 4736 U/L — ABNORMAL HIGH (ref 15–41)
Albumin: 2.9 g/dL — ABNORMAL LOW (ref 3.5–5.0)
Alkaline Phosphatase: 184 U/L — ABNORMAL HIGH (ref 38–126)
Bilirubin, Direct: 0.6 mg/dL — ABNORMAL HIGH (ref 0.0–0.2)
Indirect Bilirubin: 0.4 mg/dL (ref 0.3–0.9)
Total Bilirubin: 1 mg/dL (ref 0.0–1.2)
Total Protein: 5.7 g/dL — ABNORMAL LOW (ref 6.5–8.1)

## 2024-06-28 LAB — CBC
HCT: 33.2 % — ABNORMAL LOW (ref 36.0–46.0)
Hemoglobin: 10.9 g/dL — ABNORMAL LOW (ref 12.0–15.0)
MCH: 30.2 pg (ref 26.0–34.0)
MCHC: 32.8 g/dL (ref 30.0–36.0)
MCV: 92 fL (ref 80.0–100.0)
Platelets: 310 K/uL (ref 150–400)
RBC: 3.61 MIL/uL — ABNORMAL LOW (ref 3.87–5.11)
RDW: 15.9 % — ABNORMAL HIGH (ref 11.5–15.5)
WBC: 31.2 K/uL — ABNORMAL HIGH (ref 4.0–10.5)
nRBC: 0 % (ref 0.0–0.2)

## 2024-06-28 LAB — APTT
aPTT: 126 s — ABNORMAL HIGH (ref 24–36)
aPTT: 122 s — ABNORMAL HIGH (ref 24–36)
aPTT: 83 s — ABNORMAL HIGH (ref 24–36)

## 2024-06-28 LAB — BASIC METABOLIC PANEL WITH GFR
Anion gap: 25 — ABNORMAL HIGH (ref 5–15)
BUN: 69 mg/dL — ABNORMAL HIGH (ref 8–23)
CO2: 17 mmol/L — ABNORMAL LOW (ref 22–32)
Calcium: 8.4 mg/dL — ABNORMAL LOW (ref 8.9–10.3)
Chloride: 96 mmol/L — ABNORMAL LOW (ref 98–111)
Creatinine, Ser: 3.68 mg/dL — ABNORMAL HIGH (ref 0.44–1.00)
GFR, Estimated: 13 mL/min — ABNORMAL LOW
Glucose, Bld: 226 mg/dL — ABNORMAL HIGH (ref 70–99)
Potassium: 4.1 mmol/L (ref 3.5–5.1)
Sodium: 137 mmol/L (ref 135–145)

## 2024-06-28 LAB — HEPARIN LEVEL (UNFRACTIONATED)
Heparin Unfractionated: >1.1 [IU]/mL — ABNORMAL HIGH (ref 0.30–0.70)
Heparin Unfractionated: >1.1 [IU]/mL — ABNORMAL HIGH (ref 0.30–0.70)

## 2024-06-28 LAB — VANCOMYCIN, RANDOM: Vancomycin Rm: 14 ug/mL

## 2024-06-28 MED ORDER — OSELTAMIVIR PHOSPHATE 30 MG PO CAPS
30.0000 mg | ORAL_CAPSULE | Freq: Every day | ORAL | Status: DC
  Administered 2024-06-28 – 2024-07-05 (×8): 30 mg via ORAL
  Filled 2024-06-28 (×3): qty 1

## 2024-06-28 MED ORDER — DICLOFENAC SODIUM 1 % EX GEL
2.0000 g | Freq: Three times a day (TID) | CUTANEOUS | Status: AC
  Administered 2024-06-28 – 2024-07-08 (×36): 2 g via TOPICAL
  Filled 2024-06-28: qty 100

## 2024-06-28 MED ORDER — LIDOCAINE 5 % EX PTCH: 1.0000 | MEDICATED_PATCH | CUTANEOUS | Status: DC

## 2024-06-28 MED ORDER — PREDNISONE 20 MG PO TABS
40.0000 mg | ORAL_TABLET | Freq: Every day | ORAL | Status: AC
  Administered 2024-06-28 – 2024-07-01 (×4): 40 mg via ORAL
  Filled 2024-06-28 (×3): qty 2

## 2024-06-28 MED ORDER — IPRATROPIUM-ALBUTEROL 0.5-2.5 (3) MG/3ML IN SOLN
3.0000 mL | Freq: Two times a day (BID) | RESPIRATORY_TRACT | Status: DC
  Administered 2024-06-28: 3 mL via RESPIRATORY_TRACT
  Filled 2024-06-28 (×2): qty 3

## 2024-06-28 MED ORDER — FUROSEMIDE 10 MG/ML IJ SOLN
80.0000 mg | Freq: Once | INTRAMUSCULAR | Status: AC
  Administered 2024-06-28: 80 mg via INTRAVENOUS
  Filled 2024-06-28: qty 8

## 2024-06-28 MED ORDER — OXYCODONE HCL 5 MG PO TABS
2.5000 mg | ORAL_TABLET | Freq: Once | ORAL | Status: AC
  Administered 2024-06-28: 2.5 mg via ORAL
  Filled 2024-06-28: qty 1

## 2024-06-28 MED ORDER — VANCOMYCIN HCL IN DEXTROSE 1-5 GM/200ML-% IV SOLN
1000.0000 mg | Freq: Once | INTRAVENOUS | Status: AC
  Administered 2024-06-28: 1000 mg via INTRAVENOUS
  Filled 2024-06-28 (×2): qty 200

## 2024-06-28 MED ORDER — SODIUM BICARBONATE 650 MG PO TABS
650.0000 mg | ORAL_TABLET | Freq: Two times a day (BID) | ORAL | Status: DC
  Administered 2024-06-28 – 2024-06-30 (×5): 650 mg via ORAL
  Filled 2024-06-28 (×5): qty 1

## 2024-06-28 MED ORDER — SODIUM CHLORIDE 0.9 % IV SOLN: INTRAVENOUS | Status: AC | PRN

## 2024-06-28 MED ORDER — LIDOCAINE 5 % EX PTCH
1.0000 | MEDICATED_PATCH | Freq: Every day | CUTANEOUS | Status: AC
  Administered 2024-06-28 – 2024-06-29 (×3): 1 via TRANSDERMAL
  Filled 2024-06-28 (×3): qty 1

## 2024-06-28 MED ORDER — HEPARIN (PORCINE) 25000 UT/250ML-% IV SOLN
900.0000 [IU]/h | INTRAVENOUS | Status: DC
  Administered 2024-06-28 – 2024-06-29 (×2): 900 [IU]/h via INTRAVENOUS
  Filled 2024-06-28: qty 250

## 2024-06-28 MED ORDER — HYDROMORPHONE HCL 1 MG/ML IJ SOLN
0.2500 mg | Freq: Once | INTRAMUSCULAR | Status: AC
  Administered 2024-06-28: 0.25 mg via INTRAVENOUS
  Filled 2024-06-28: qty 1

## 2024-06-28 NOTE — Progress Notes (Signed)
 "  Rounding Note   Patient Name: Lauren Clark Date of Encounter: 06/28/2024  [facility] Cardiologist: Vinie JAYSON Maxcy, MD   Subjective  Still feels bad. Has chest pressure when sitting up (more like SOB). Significant back pain.   Scheduled Meds:  brimonidine   1 drop Right Eye BID   And   timolol   1 drop Right Eye BID   Chlorhexidine  Gluconate Cloth  6 each Topical Daily   diclofenac  Sodium  2 g Topical TID AC & HS   FLUoxetine   40 mg Oral Daily   ipratropium-albuterol   3 mL Nebulization BID   latanoprost   1 drop Right Eye QHS   levothyroxine   112 mcg Oral q morning   lidocaine   1 patch Transdermal QHS   oseltamivir   30 mg Oral Daily   predniSONE   40 mg Oral Q breakfast   sodium bicarbonate   650 mg Oral BID   sodium chloride  flush  10-40 mL Intracatheter Q12H   vancomycin  variable dose per unstable renal function (pharmacist dosing)   Does not apply See admin instructions   Continuous Infusions:  sodium chloride  Stopped (06/28/24 0401)   acetylcysteine  6.25 mg/kg/hr (06/27/24 2200)   heparin  1,050 Units/hr (06/28/24 0416)   piperacillin -tazobactam (ZOSYN )  IV 100 mL/hr at 06/28/24 0416   PRN Meds: sodium chloride , albuterol , sodium chloride  flush   Vital Signs  Vitals:   06/28/24 0426 06/28/24 0446 06/28/24 0640 06/28/24 0724  BP:   119/65 123/71  Pulse: 72 74 69 76  Resp: 19 (!) 21 19 (!) 21  Temp:    97.7 F (36.5 C)  TempSrc:    Axillary  SpO2: 99% 98% 95% 90%  Weight:      Height:        Intake/Output Summary (Last 24 hours) at 06/28/2024 0834 Last data filed at 06/28/2024 0446 Gross per 24 hour  Intake 1397.31 ml  Output 775 ml  Net 622.31 ml      06/26/2024   11:17 PM 06/25/2024    6:03 AM 06/24/2024    5:00 AM  Last 3 Weights  Weight (lbs) 202 lb 224 lb 13.9 oz 229 lb 4.5 oz  Weight (kg) 91.627 kg 102 kg 104 kg      Telemetry NSR, no significant ventricular ectopy - Personally Reviewed  ECG  NSR, 1st degree AV block -  Personally Reviewed  Physical Exam  GEN: No acute distress.   Neck: No JVD Cardiac: RRR, no murmurs, rubs, or gallops.  Respiratory: Clear to auscultation bilaterally. GI: Soft, nontender, non-distended  MS: No edema; No deformity. Neuro:  Nonfocal  Psych: Normal affect   Labs High Sensitivity Troponin:  No results for input(s): TROPONINIHS in the last 720 hours.   Chemistry Recent Labs  Lab 06/23/24 0225 06/24/24 0740 06/27/24 0705 06/27/24 1138 06/27/24 1514 06/28/24 0531  NA 131*   < > 132*  --  134* 137  K 3.7   < > 4.7  --  4.7 4.1  CL 97*   < > 98  --  98 96*  CO2 23   < > 19*  --  16* 17*  GLUCOSE 132*   < > 170*  --  229* 226*  BUN 62*   < > 59*  --  64* 69*  CREATININE 3.58*   < > 3.36*  --  3.63* 3.68*  CALCIUM  8.3*   < > 8.7*  --  8.6* 8.4*  MG 2.4  --  3.2* 3.1*  --   --  PROT 5.8*   < > 6.4*  --  6.0* 5.7*  ALBUMIN  2.3*   < > 3.2*  --  3.0* 2.9*  AST 90*   < > 5,581*  --  6,589* 4,736*  ALT 87*   < > 2,076*  --  2,684* 2,637*  ALKPHOS 149*   < > 203*  --  189* 184*  BILITOT 0.4   < > 1.2  --  1.0 1.0  GFRNONAA 13*   < > 14*  --  13* 13*  ANIONGAP 11   < > 15  --  20* 25*   < > = values in this interval not displayed.    Lipids No results for input(s): CHOL, TRIG, HDL, LABVLDL, LDLCALC, CHOLHDL in the last 168 hours.  Hematology Recent Labs  Lab 06/26/24 2323 06/26/24 2327 06/27/24 0443 06/27/24 0705 06/28/24 0531  WBC 35.1*  --   --  26.5* 30.4*  RBC 4.10  --   --  3.89 3.42*  HGB 12.2   < > 12.6 11.6* 10.5*  HCT 38.6   < > 37.0 36.7 30.8*  MCV 94.1  --   --  94.3 90.1  MCH 29.8  --   --  29.8 30.7  MCHC 31.6  --   --  31.6 34.1  RDW 15.6*  --   --  15.8* 15.8*  PLT 313  --   --  241 260   < > = values in this interval not displayed.   Thyroid  No results for input(s): TSH, FREET4 in the last 168 hours.  BNP Recent Labs  Lab 06/27/24 0002 06/27/24 0705  PROBNP 8,463.0* 67,156.9*    DDimer No results for input(s):  DDIMER in the last 168 hours.   Radiology  US  EKG SITE RITE Result Date: 06/27/2024 If Site Rite image not attached, placement could not be confirmed due to current cardiac rhythm.  ECHOCARDIOGRAM COMPLETE Result Date: 06/27/2024    ECHOCARDIOGRAM REPORT   Patient Name:   Lauren Clark Date of Exam: 06/27/2024 Medical Rec #:  [id number]    Height:       65.0 in Accession #:    [id number]   Weight:       202.0 lb Date of Birth:  1956-03-08    BSA:          1.987 m Patient Age:    68 years     BP:           105/52 mmHg Patient Gender: F            HR:           56 bpm. Exam Location:  Inpatient Procedure: 2D Echo, Cardiac Doppler, Color Doppler and Intracardiac            Opacification Agent (Both Spectral and Color Flow Doppler were            utilized during procedure). STAT ECHO Indications:    Dyspnea  History:        Patient has prior history of Echocardiogram examinations, most                 recent 05/20/2024. CHF, COPD; Risk Factors:Hypertension and                 Dyslipidemia.  Sonographer:    Philomena Daring Referring Phys: JONATHAN F BRANCH  Sonographer Comments: Patient is obese. IMPRESSIONS  1. Apical akinesis, pattern could suggest stress induced cardiomyopathy. Cannot exlcude possible LAD  ischemia. . Left ventricular ejection fraction, by estimation, is 30 to 35%. The left ventricle has moderately decreased function. The left ventricle demonstrates regional wall motion abnormalities (see scoring diagram/findings for description). There is moderate left ventricular hypertrophy. Left ventricular diastolic parameters are consistent with Grade II diastolic dysfunction (pseudonormalization). Elevated left atrial pressure.  2. Right ventricular systolic function is normal. The right ventricular size is normal.  3. The mitral valve is abnormal. Trivial mitral valve regurgitation. No evidence of mitral stenosis.  4. The aortic valve was not well visualized. There is mild calcification of the aortic  valve. There is mild thickening of the aortic valve. Aortic valve regurgitation is moderate.  5. The inferior vena cava is normal in size with <50% respiratory variability, suggesting right atrial pressure of 8 mmHg. FINDINGS  Left Ventricle: Apical akinesis, pattern could suggest stress induced cardiomyopathy. Cannot exlcude possible LAD ischemia. Left ventricular ejection fraction, by estimation, is 30 to 35%. The left ventricle has moderately decreased function. The left ventricle demonstrates regional wall motion abnormalities. Definity  contrast agent was given IV to delineate the left ventricular endocardial borders. The left ventricular internal cavity size was normal in size. There is moderate left ventricular hypertrophy. Left ventricular diastolic parameters are consistent with Grade II diastolic dysfunction (pseudonormalization). Elevated left atrial pressure.  LV Wall Scoring: The mid and distal anterior septum, apical lateral segment, apical anterior segment, and apex are akinetic. Right Ventricle: The right ventricular size is normal. Right vetricular wall thickness was not well visualized. Right ventricular systolic function is normal. Left Atrium: Left atrial size was not well visualized. Right Atrium: Right atrial size was not well visualized. Pericardium: There is no evidence of pericardial effusion. Mitral Valve: The mitral valve is abnormal. Trivial mitral valve regurgitation. No evidence of mitral valve stenosis. Tricuspid Valve: The tricuspid valve is not well visualized. Tricuspid valve regurgitation is trivial. No evidence of tricuspid stenosis. Aortic Valve: The aortic valve was not well visualized. There is mild calcification of the aortic valve. There is mild thickening of the aortic valve. There is mild aortic valve annular calcification. Aortic valve regurgitation is moderate. Aortic valve mean gradient measures 4.1 mmHg. Aortic valve peak gradient measures 10.4 mmHg. Aortic valve area,  by VTI measures 2.79 cm. Pulmonic Valve: The pulmonic valve was not well visualized. Pulmonic valve regurgitation is not visualized. No evidence of pulmonic stenosis. Aorta: The aortic root was not well visualized. Venous: The inferior vena cava is normal in size with less than 50% respiratory variability, suggesting right atrial pressure of 8 mmHg. IAS/Shunts: No atrial level shunt detected by color flow Doppler.  LEFT VENTRICLE PLAX 2D LVIDd:         5.10 cm   Diastology LVIDs:         3.60 cm   LV e' medial:    5.33 cm/s LV PW:         1.20 cm   LV E/e' medial:  16.2 LV IVS:        1.30 cm   LV e' lateral:   7.83 cm/s LVOT diam:     1.90 cm   LV E/e' lateral: 11.0 LV SV:         86 LV SV Index:   43 LVOT Area:     2.84 cm  RIGHT VENTRICLE             IVC RV S prime:     16.50 cm/s  IVC diam: 2.10 cm TAPSE (M-mode): 2.2 cm LEFT  ATRIUM             Index        RIGHT ATRIUM           Index LA diam:        4.10 cm 2.06 cm/m   RA Area:     16.20 cm LA Vol (A2C):   46.7 ml 23.51 ml/m  RA Volume:   35.50 ml  17.87 ml/m LA Vol (A4C):   52.6 ml 26.48 ml/m LA Biplane Vol: 51.0 ml 25.67 ml/m  AORTIC VALVE AV Area (Vmax):    2.27 cm AV Area (Vmean):   2.68 cm AV Area (VTI):     2.79 cm AV Vmax:           161.13 cm/s AV Vmean:          90.612 cm/s AV VTI:            0.309 m AV Peak Grad:      10.4 mmHg AV Mean Grad:      4.1 mmHg LVOT Vmax:         129.00 cm/s LVOT Vmean:        85.600 cm/s LVOT VTI:          0.304 m LVOT/AV VTI ratio: 0.98 MITRAL VALVE MV Area (PHT): 4.57 cm    SHUNTS MV Decel Time: 166 msec    Systemic VTI:  0.30 m MV E velocity: 86.20 cm/s  Systemic Diam: 1.90 cm MV A velocity: 59.80 cm/s MV E/A ratio:  1.44 Dorn Ross MD Electronically signed by Dorn Ross MD Signature Date/Time: 06/27/2024/9:19:25 AM    Final    CT CHEST WO CONTRAST Result Date: 06/27/2024 CLINICAL DATA:  Respiratory illness. EXAM: CT CHEST WITHOUT CONTRAST TECHNIQUE: Multidetector CT imaging of the chest was  performed following the standard protocol without IV contrast. RADIATION DOSE REDUCTION: This exam was performed according to the departmental dose-optimization program which includes automated exposure control, adjustment of the mA and/or kV according to patient size and/or use of iterative reconstruction technique. COMPARISON:  Chest x-ray 1 day prior.  Chest CT 04/22/2024 FINDINGS: Cardiovascular: The heart is enlarged. No substantial pericardial effusion. Coronary artery calcification is evident. Mild atherosclerotic calcification is noted in the wall of the thoracic aorta. Ascending thoracic aorta measures 4.1 cm diameter compatible with aneurysm. Mediastinum/Nodes: No mediastinal lymphadenopathy. No evidence for gross hilar lymphadenopathy although assessment is limited by the lack of intravenous contrast on the current study. The esophagus has normal imaging features. There is no axillary lymphadenopathy. Lungs/Pleura: Interval development of patchy ground-glass opacity with interlobular septal thickening in both upper lobes, left greater than right. Confluent areas of consolidative airspace disease are seen in a patchy distribution of the mid and lower lungs bilaterally. Trace left pleural effusion evident. Upper Abdomen: Surgical changes noted in the stomach. Musculoskeletal: No worrisome lytic or sclerotic osseous abnormality. IMPRESSION: 1. Interval development of patchy ground-glass opacity with interlobular septal thickening in both upper lobes, left greater than right. Confluent areas of consolidative airspace disease are seen in a patchy distribution of the mid and lower lungs bilaterally. Imaging features are compatible with multifocal pneumonia. 2. Trace left pleural effusion. 3.  Aortic Atherosclerosis (ICD10-I70.0). Electronically Signed   By: Camellia Candle M.D.   On: 06/27/2024 06:38   US  Abdomen Limited RUQ (LIVER/GB) Result Date: 06/27/2024 CLINICAL DATA:  Epigastric pain. EXAM: ULTRASOUND  ABDOMEN LIMITED RIGHT UPPER QUADRANT COMPARISON:  Ultrasound 1 week ago 06/20/2024 FINDINGS: Gallbladder: Physiologically distended. No  gallbladder wall thickening. Small gallstones. No pericholecystic fluid. No sonographic Murphy sign noted by sonographer. Common bile duct: Diameter: 4 mm, normal Liver: Diffusely increased parenchymal echogenicity. The echogenic lesion on prior exam was not delineated on the current exam. Technically limited due to difficulty with patient positioning. Portal vein is patent on color Doppler imaging with normal direction of blood flow towards the liver. Other: No right upper quadrant ascites. IMPRESSION: 1. Gallstones without sonographic findings of acute cholecystitis. 2. Increased hepatic parenchymal echogenicity typical of steatosis. 3. Previous echogenic right lobe lesion is not seen on the current exam. Electronically Signed   By: Andrea Gasman M.D.   On: 06/27/2024 01:22   CT Head Wo Contrast Result Date: 06/27/2024 EXAM: CT HEAD AND CERVICAL SPINE 06/26/2024 11:53:00 PM TECHNIQUE: CT of the head and cervical spine was performed without the administration of intravenous contrast. Multiplanar reformatted images are provided for review. Automated exposure control, iterative reconstruction, and/or weight based adjustment of the mA/kV was utilized to reduce the radiation dose to as low as reasonably achievable. COMPARISON: None available. CLINICAL HISTORY: Head trauma, minor (Age >= 65y) FINDINGS: CT HEAD BRAIN AND VENTRICLES: No acute intracranial hemorrhage. No mass effect or midline shift. No abnormal extra-axial fluid collection. No evidence of acute infarct. No hydrocephalus. A 1.1 cm calcified extra-axial dural based mass along the anterior falx is unchanged. ORBITS: No acute abnormality. SINUSES AND MASTOIDS: No acute abnormality. SOFT TISSUES AND SKULL: No acute skull fracture. No acute soft tissue abnormality. CT CERVICAL SPINE BONES AND ALIGNMENT: No acute fracture  or traumatic malalignment. DEGENERATIVE CHANGES: No significant degenerative changes. SOFT TISSUES: No prevertebral soft tissue swelling. IMPRESSION: 1. No acute intracranial abnormality. 2. No acute fracture or traumatic malalignment of the cervical spine. 3. Unchanged small putative left parfalcine meningioma. Electronically signed by: Gilmore Molt 06/27/2024 12:03 AM EST RP Workstation: [id number]   CT Cervical Spine Wo Contrast Result Date: 06/27/2024 EXAM: CT HEAD AND CERVICAL SPINE 06/26/2024 11:53:00 PM TECHNIQUE: CT of the head and cervical spine was performed without the administration of intravenous contrast. Multiplanar reformatted images are provided for review. Automated exposure control, iterative reconstruction, and/or weight based adjustment of the mA/kV was utilized to reduce the radiation dose to as low as reasonably achievable. COMPARISON: None available. CLINICAL HISTORY: Head trauma, minor (Age >= 65y) FINDINGS: CT HEAD BRAIN AND VENTRICLES: No acute intracranial hemorrhage. No mass effect or midline shift. No abnormal extra-axial fluid collection. No evidence of acute infarct. No hydrocephalus. A 1.1 cm calcified extra-axial dural based mass along the anterior falx is unchanged. ORBITS: No acute abnormality. SINUSES AND MASTOIDS: No acute abnormality. SOFT TISSUES AND SKULL: No acute skull fracture. No acute soft tissue abnormality. CT CERVICAL SPINE BONES AND ALIGNMENT: No acute fracture or traumatic malalignment. DEGENERATIVE CHANGES: No significant degenerative changes. SOFT TISSUES: No prevertebral soft tissue swelling. IMPRESSION: 1. No acute intracranial abnormality. 2. No acute fracture or traumatic malalignment of the cervical spine. 3. Unchanged small putative left parfalcine meningioma. Electronically signed by: Gilmore Molt 06/27/2024 12:03 AM EST RP Workstation: [id number]   DG Knee Complete 4 Views Left Result Date: 06/26/2024 CLINICAL DATA:  Trauma, fall. EXAM: LEFT  KNEE - COMPLETE 4+ VIEW COMPARISON:  06/06/2022 FINDINGS: Knee arthroplasty in expected alignment. Prior patellar resurfacing. No acute or periprosthetic fracture. No periprosthetic lucency. No significant joint effusion. Mild generalized soft tissue edema. IMPRESSION: Knee arthroplasty without complication or acute fracture. Electronically Signed   By: Andrea Gasman M.D.   On: 06/26/2024 23:56  DG Foot Complete Left Result Date: 06/26/2024 CLINICAL DATA:  Trauma, fall. EXAM: LEFT FOOT - COMPLETE 3+ VIEW COMPARISON:  None Available. FINDINGS: There is no evidence of fracture or dislocation. Minor talonavicular degenerative change. Dorsal soft tissue edema. IMPRESSION: Dorsal soft tissue edema. No fracture or subluxation. Electronically Signed   By: Andrea Gasman M.D.   On: 06/26/2024 23:55   DG Knee Complete 4 Views Right Result Date: 06/26/2024 CLINICAL DATA:  Trauma, fall. EXAM: RIGHT KNEE - COMPLETE 4+ VIEW COMPARISON:  11/15/2022 FINDINGS: Knee arthroplasty in expected alignment. Prior patellar resurfacing. No acute or periprosthetic fracture. Minimal joint effusion. Mild generalized soft tissue edema. IMPRESSION: Knee arthroplasty without complication or acute fracture. Electronically Signed   By: Andrea Gasman M.D.   On: 06/26/2024 23:54   DG Chest Port 1 View Result Date: 06/26/2024 CLINICAL DATA:  Trauma, fall. EXAM: PORTABLE CHEST 1 VIEW COMPARISON:  06/17/2024 FINDINGS: Cardiomegaly is stable. Mediastinal contours are unchanged. Stable interstitial coarsening. Subsegmental atelectasis/scarring in the right mid lung. No pneumothorax. No large pleural effusion. On limited assessment, no acute osseous findings. Bilateral shoulder arthropathy. IMPRESSION: 1. No acute findings or evidence of traumatic injury. 2. Stable cardiomegaly. Electronically Signed   By: Andrea Gasman M.D.   On: 06/26/2024 23:53   DG Pelvis Portable Result Date: 06/26/2024 CLINICAL DATA:  Trauma, fall. EXAM:  PORTABLE PELVIS 1-2 VIEWS COMPARISON:  06/16/2024 FINDINGS: Right hip arthroplasty in expected alignment. No acute or periprosthetic fracture. Pubic rami are intact. No pubic symphyseal or sacroiliac diastasis. Left hip osteoarthritis. IMPRESSION: Right hip arthroplasty without complication or acute fracture. Electronically Signed   By: Andrea Gasman M.D.   On: 06/26/2024 23:52    Cardiac Studies  Echo 05/20/2024 1. Left ventricular ejection fraction, by estimation, is 55 to 60%. The  left ventricle has normal function. There is mild concentric left  ventricular hypertrophy. Left ventricular diastolic parameters are  consistent with Grade II diastolic dysfunction  (pseudonormalization). The average left ventricular global longitudinal  strain is -21.9 %. The global longitudinal strain is normal.   2. Right ventricular systolic function is normal. The right ventricular  size is normal. Tricuspid regurgitation signal is inadequate for assessing  PA pressure.   3. Left atrial size was mildly dilated.   4. The mitral valve is normal in structure. Mild mitral valve  regurgitation. No evidence of mitral stenosis.   5. The aortic valve was not well visualized. There is mild calcification  of the aortic valve. Aortic valve regurgitation is mild to moderate. No  aortic stenosis is present.   6. Aortic dilatation noted. There is moderate dilatation of the aortic  root, measuring 44 mm. There is mild dilatation of the ascending aorta,  measuring 43 mm.   7. The inferior vena cava is normal in size with greater than 50%  respiratory variability, suggesting right atrial pressure of 3 mmHg.    Echo 06/27/2024  1. Apical akinesis, pattern could suggest stress induced cardiomyopathy.  Cannot exlcude possible LAD ischemia. . Left ventricular ejection  fraction, by estimation, is 30 to 35%. The left ventricle has moderately  decreased function. The left ventricle  demonstrates regional wall motion  abnormalities (see scoring  diagram/findings for description). There is moderate left ventricular  hypertrophy. Left ventricular diastolic parameters are consistent with  Grade II diastolic dysfunction  (pseudonormalization). Elevated left atrial pressure.   2. Right ventricular systolic function is normal. The right ventricular  size is normal.   3. The mitral valve is abnormal.  Trivial mitral valve regurgitation. No  evidence of mitral stenosis.   4. The aortic valve was not well visualized. There is mild calcification  of the aortic valve. There is mild thickening of the aortic valve. Aortic  valve regurgitation is moderate.   5. The inferior vena cava is normal in size with <50% respiratory  variability, suggesting right atrial pressure of 8 mmHg.     Patient Profile   68 y.o. female with PMH of morbid obesity s/p gastric sleeve, history of DVT, A-fib, chronic diastolic heart failure, hypertension, hyperlipidemia, thoracic aortic aneurysm, hypothyroidism and nonobstructive CAD who was seen for elevated troponin and shock. She is flu A+, CT shows likely multifocal PNA and UA shows many bacteria.   Assessment & Plan   Acute systolic CHF  - Echo 05/20/2024 EF 55-60%, mild to moderate AI, mild MR  - Echo 06/27/2024 EF 30-35%, apical akinesis, pattern could suggest stress induced cardiomyopathy, cannot exclude possible LAD ischemia, trivial MR, moderate AI  - proBNP 8400 --> 32000  - nonobstructive CAD on recent coronary CT in 04/2024  - not a cath candidate given renal failure  - GDMT limited by hypotension and renal failure  - more alert and oriented compare to yesterday. BP improving. Liver enzyme coming down. Overall, doing well. Wanted more pain med for back pain. Need to monitor for sign of volume overload closely  Elevated troponin  - likely related to #3.   - nonobstructive CAD on recent coronary CT in 04/2024  - not a cath candidate given renal failure  Shock: more likely  to be septic shock rather than cardiac shock.  - flu A+. CT showed likely multifocal PNA. UA showed many bacteria. Recent admission for L pyelonephritis and received 7 days of rocephin , last dose 12/20. Started on Zosyn , vanc, prednisone  and Tamiflu   - PICC line placed to obtain coox. Last Coox 61.2 last night.  - BP initially low on arrival, however improved overnight.   Elevated transaminase - peaked at AST 6589 ALT 2684, trending down to AST 4736 ALT 2637 this morning.   Acute on chronic renal insufficiency  - baseline creatinine was 1.2-1.3 back in October 2025   - recent admission in early Dec 2025 for left pyelonephritis, imaging at the time also showed atrophic right kidney. Creatinine peaked at 3.62 during the hospitalization however came down to 3.2 on the day of discharge.  - Cr 2.97 --> 3.08 --> 3.2 --> 3.36 --> 3.68  Thoracic aortic aneurysm: History of dissection, however healed on repeat imaging. Followed by Dr. Lucas as outpatient. Known penetrating ulcer at 6 o'clock in mid ascending aorta. Stable  Atrial fibrillation: On Eliquis  at home. Maintaining sinus rhythm based on EKG. No longer on amiodarone  or beta-blocker due to bradycardia. Home Eliquis  held, transitioned to IV heparin   Hyperlipidemia: Hold home Crestor  due to elevated transaminase  Leukocytosis: septic and on prednisone     For questions or updates, please contact [facility] Please consult [email or web address] for contact info under     Signed, Scot Ford, PA  06/28/2024, 8:34 AM    "

## 2024-06-28 NOTE — Progress Notes (Signed)
 " PROGRESS NOTE    Lauren Clark  FMW:[id number] DOB: Feb 14, 1956 DOA: 06/26/2024 PCP: [name], MD  Subjective: No acute events overnight. Seen and examined at bedside. Reports feeling better with improvement in shortness of breath. Reports having ongoing back pain that she says is related to her being in the hospital bed. She is unsure if her mechanical fall prior to admission triggered this back pain. Denies nausea, vomiting, constipation.   Hospital Course: No notes on file  As per H&P: 68 y.o. female with medical history significant of COPD, chronic hypoxemic respiratory failure on 2 L home oxygen, CAD, A-fib on Eliquis , HFpEF, ascending aortic aneurysm followed by cardiothoracic surgery, hypertension, hyperlipidemia, hypothyroidism, CKD stage IIIa, anemia, anxiety, depression, GERD, glaucoma, vision impairment, morbid obesity status post gastric sleeve surgery, PVD.  Recent hospital admission 12/13-12/24 for AKI, pyelonephritis, ileus, mildly elevated LFTs, and acute on chronic hypoxemic respiratory failure secondary to CHF exacerbation. Creatinine had peaked to 3.86.  Her diuretics were held and she was given IV albumin .  Creatinine improved to 2.97 at the time of discharge yesterday and nephrology resumed Lasix ..  Completed 7 days of Rocephin  for pyelonephritis.  She was evaluated by physical therapy and discharged with home health PT/OT yesterday afternoon.  Patient returned to the ED via EMS in the evening due to concern for shortness of breath, fall at home, and possible syncope.  She was saturating in the 80s on her home 2 L [place] with EMS.  She was wheezing on exam and given given albuterol  treatment by EMS.  Patient noted to have bruising on her left foot and complained of knee pain as well.  In the ED, patient was initially saturating well on 4.5 L [place] but after RN laid her down in the bed to place bedpan, patient desaturated to the mid 80s and oxygen saturation did not improve with  nonrebreather.  She became tachypneic to the 30s and was subsequently placed on BiPAP with improvement of work of breathing and improvement of oxygen saturation to the 90s.  Afebrile.  Labs showing worsening leukocytosis (WBC count now 35.1), sodium 131, bicarb 17, anion gap 16, glucose 160, BUN 56, creatinine 3.0, AST 586, ALT 276, alk phos 203, T. bili 1.2, lactic acid 4.1>3.3, CK 385, proBNP 8463, influenza A PCR positive, no blood cultures drawn.  UA showing large amount of leukocytes, 21-50 RBCs, 21-50 WBCs, and many bacteria EKG showing sinus or ectopic atrial rhythm with borderline PR prolongation, QTc 531, and no acute ischemic changes.  Troponin 157.  X-rays of pelvis, bilateral knees, and left foot negative for fractures.  Chest x-ray showing no acute findings or evidence of traumatic injury.  CT head/C-spine negative for acute findings.  Right upper quadrant abdominal ultrasound showing gallstones without sonographic findings of acute cholecystitis.  Showing increased hepatic parenchymal echogenicity typical of steatosis.  Patient was given Solu-Medrol  125 mg, vancomycin , Zosyn , IV mag 2 g, and continuous albuterol  neb treatment in the ED.   Assessment and Plan: No notes have been filed under this hospital service. Service: Hospitalist  Acute on chronic hypoxemic respiratory failure Patient was noted to be saturating in the 80s on her home 2 L [place] with EMS.  She was wheezing with EMS and was given albuterol  treatment. - currently on BiPAP, on 2L via [place] at home - etiology unclear, likely multifactorial in the setting of acute COPD exacerbation, acute influenza A infection with superimposed bacterial PNA, acute CHF exacerbation - less likely acute PE  as already on anticoagulation  - Blood gas without evidence of hypercapnia.   - see management of active co-morbidities as elsewhere - cont BiPAP for now - wean off BiPAP as tolerated   Acute COPD exacerbation - status post albuterol  nebs,  Solu-Medrol  125 mg, and IV mag 2 g in the ED.  - cont IV solumedrol while on BiPAP, consider transition to PO prednisone  when able to take PO - cont duonebs ATC - cont albuterol  PRN   Sepsis Acute influenza A infection Possible bacterial PNA Possible - Recently finished 7-day course of Rocephin  for pyelonephritis. Ucx with polymicrobial growth at that time - UA continues to show evidence of pyuria and bacteria.  - Influenza A PCR positive.  - CXR with no any acute findings but stable cardiomegaly - CT chest w/o contrast showed interval development of patchy ground-glass opacity with interlobular septal thickening in both upper lobes, L > R. Confluent areas of consolidative airspace disease are seen in a patchy distribution of the mid and lower lungs bilaterally. Imaging features are compatible with multifocal pneumonia. - CT abdomen pelvis w/o contrast showed mild L hydroureteronephrosis without stone, high density material at upper pole calyx in L kidney that maybe blood product/infectious lesion/neoplasm, lamellated stone with internal gas in urinary bladder, cholelithiasis  - cont oseltamivir  - cont vancomycin  and zosyn  - follow blood and urine culture data   Possible acute on chronic HFpEF Hypotension - clinically fluid overloaded with bilateral LE edema - CXR with no any acute findings but stable cardiomegaly - proBNP 32843 < 8463 - TTE showed apical akinesis concerning for stress induced cardiomyopathy but cannot exclude LAD distribution ischemia, LVEF 30-35%, RV size/systolic function normal, IVC normal size with <50% variability suggesting RAP - given lasix  40mg  IV once in the ED - one dose IV lasix  80mg  given today as per cardio recs - seen by PCCM. Patient wants to avoid procedures including central line placement and wants to try and avoid ICU level of care.  - cont external foley for accurate I/Os - daily weights - monitor on tele - cardiology following - palliative  following   Elevated troponin NSTEMI History of CAD - EKG without acute ischemic changes - Trop 365 < 323 < 157 - TTE with apical akinesis - hold home eliquis   - cont heparin  drip - hold statin given acute liver failiure - hold metoprolol  given hypotension - monitor on tele - cardiology following   Acute liver failure  - improving - Patient is reporting history of postprandial abdominal pain for several months - Transaminases and alkaline phosphatase worse compared to labs done during recent hospitalization. AST 586, ALT 276, alk phos 203.   - etiology unclear, likely shock liver in the setting of hypotension due to possible shock state (septic vs cardiogenic) - Right upper quadrant abdominal ultrasound showed gallstones without sonographic findings of acute cholecystitis.  Showing increased hepatic parenchymal echogenicity typical of steatosis.   - Acute hepatitis panel negative  - acetaminophen  level low  - hold home statin therapy - cont NAC protocol - monitor LFTs - She will need outpatient evaluation by general surgery for consideration of cholecystectomy after her acute pulmonary and cardiac issues resolve.    AKI on CKD stage IIIa High anion gap metabolic acidosis - Creatinine 3.68 < 3.2, 3.3 on admission, 2.97 on discharge 06/26/24, baseline before last admission was around 1.2.  - concern for fluid overload state - CT A/P findings as elsewhere - status post lasix  IV dose in  the ED - IV diuresis as elsewhere - start sodium bicarbonate  tablet - avoid nephrotoxic agents - renal dose medications - monitor I/Os, renal function  - discussed with nephrology who will continue to follow - urology consulted, awaiting recs   Lactic acidosis - likely in the setting of hypotension due to possible shock state (septic vs cardiogenic), NSTEMI and acute liver failure - resolved now - monitor clinically   Paroxysmal A-fib - Cont heparin  drip - hold home eliquis  - cardiology  following   Mild chronic hyponatremia - asymptomatic - Stable since recent hospitalization - likely due to hypervolemia.  - status post one dose IV Lasix  in ED - one dose IV lasix  80mg  given today as per cardio recs - monitor BMP   QT prolongation QTc 531 on EKG.   - Monitor potassium and magnesium  levels.  -  Avoid QT prolonging drugs if possible.  -  Follow-up repeat EKG.   Hyperlipidemia - hold statin therapy given acute liver failure   Hypothyroidism - cont levothyroxine , can get IV form if remains unable to take PO for a couple of days   Anxiety and depression - cont home fluoxetine    Back pain Mechanical Fall - No traumatic injuries identified on imaging of chest, pelvis, bilateral knees, left foot, head, and C-spine.  Fall precautions.  - cannot give acetaminophen  given liver failure - want to avoid systemic NSAIDs given AKI on CKD - start voltaren  gel - cont lidocaine  patch - can consider one time doses of opioids if pain unbearable - will get Xray T- and L-spine - PT/OT following  DVT prophylaxis:   IV Heparin    Code Status: Limited: Do not attempt resuscitation (DNR) -DNR-LIMITED -Do Not Intubate/DNI   Disposition Plan: TBD Reason for continuing need for hospitalization: severity of illness, IV antibiotics, IV heparin , cardiology recommendations, urology recommendations, palliative recommendations, monitor renal function, PT follow up  Objective: Vitals:   06/28/24 0446 06/28/24 0640 06/28/24 0724 06/28/24 1311  BP:  119/65 123/71 136/68  Pulse: 74 69 76 72  Resp: (!) 21 19 (!) 21   Temp:   97.7 F (36.5 C)   TempSrc:   Axillary   SpO2: 98% 95% 90% 92%  Weight:      Height:        Intake/Output Summary (Last 24 hours) at 06/28/2024 1520 Last data filed at 06/28/2024 1242 Gross per 24 hour  Intake 1482.25 ml  Output 450 ml  Net 1032.25 ml   Filed Weights   06/26/24 2317  Weight: 91.6 kg    Examination:  Physical Exam Vitals and nursing  note reviewed.  Constitutional:      General: She is not in acute distress.    Appearance: She is ill-appearing.  HENT:     Head: Normocephalic and atraumatic.  Cardiovascular:     Rate and Rhythm: Normal rate and regular rhythm.     Pulses: Normal pulses.     Heart sounds: Normal heart sounds.  Pulmonary:     Effort: Pulmonary effort is normal.     Breath sounds: Normal breath sounds.  Abdominal:     General: Bowel sounds are normal.     Palpations: Abdomen is soft.  Neurological:     Mental Status: She is alert.     Data Reviewed: I have personally reviewed following labs and imaging studies  CBC: Recent Labs  Lab 06/23/24 0225 06/25/24 0209 06/26/24 2323 06/26/24 2327 06/27/24 0443 06/27/24 0705 06/28/24 0531  WBC 21.3* 18.9* 35.1*  --   --  26.5* 30.4*  NEUTROABS  --  15.7*  --   --   --   --  28.8*  HGB 11.8* 11.2* 12.2 14.3 12.6 11.6* 10.5*  HCT 36.9 34.4* 38.6 42.0 37.0 36.7 30.8*  MCV 91.1 90.8 94.1  --   --  94.3 90.1  PLT 236 248 313  --   --  241 260   Basic Metabolic Panel: Recent Labs  Lab 06/23/24 0225 06/24/24 0740 06/25/24 0209 06/26/24 0325 06/26/24 2323 06/26/24 2327 06/27/24 0443 06/27/24 0705 06/27/24 1138 06/27/24 1514 06/28/24 0531  NA 131* 131*   < > 133* 131* 133* 132* 132*  --  134* 137  K 3.7 4.4   < > 4.1 4.7 4.6 4.2 4.7  --  4.7 4.1  CL 97* 98   < > 99 99 101  --  98  --  98 96*  CO2 23 21*   < > 24 17*  --   --  19*  --  16* 17*  GLUCOSE 132* 128*   < > 96 160* 160*  --  170*  --  229* 226*  BUN 62* 65*   < > 59* 56* 65*  --  59*  --  64* 69*  CREATININE 3.58* 3.39*   < > 2.97* 3.08* 3.20*  --  3.36*  --  3.63* 3.68*  CALCIUM  8.3* 8.6*   < > 8.6* 8.8*  --   --  8.7*  --  8.6* 8.4*  MG 2.4  --   --   --   --   --   --  3.2* 3.1*  --   --   PHOS  --  4.1  --   --   --   --   --   --  5.7*  --   --    < > = values in this interval not displayed.   GFR: Estimated Creatinine Clearance: 16.1 mL/min (A) (by C-G formula based on  SCr of 3.68 mg/dL (H)). Liver Function Tests: Recent Labs  Lab 06/26/24 0325 06/26/24 2323 06/27/24 0705 06/27/24 1514 06/28/24 0531  AST 101* 586* 5,581* 6,589* 4,736*  ALT 95* 276* 2,076* 2,684* 2,637*  ALKPHOS 108 203* 203* 189* 184*  BILITOT 0.6 1.2 1.2 1.0 1.0  PROT 6.3* 7.0 6.4* 6.0* 5.7*  ALBUMIN  3.3* 3.4* 3.2* 3.0* 2.9*   No results for input(s): LIPASE, AMYLASE in the last 168 hours. No results for input(s): AMMONIA in the last 168 hours. Coagulation Profile: Recent Labs  Lab 06/26/24 2323  INR 2.4*   Cardiac Enzymes: Recent Labs  Lab 06/27/24 0002 06/27/24 0705  CKTOTAL 385* 894*   ProBNP, BNP (last 5 results) Recent Labs    01/01/24 0851 04/15/24 1032 06/17/24 0135 06/27/24 0002 06/27/24 0705  PROBNP  --   --   --  8,463.0* 32,843.0*  BNP 311.3* 179.8* 567.8*  --   --    HbA1C: No results for input(s): HGBA1C in the last 72 hours. CBG: Recent Labs  Lab 06/26/24 0425  GLUCAP 97   Lipid Profile: No results for input(s): CHOL, HDL, LDLCALC, TRIG, CHOLHDL, LDLDIRECT in the last 72 hours. Thyroid  Function Tests: No results for input(s): TSH, T4TOTAL, FREET4, T3FREE, THYROIDAB in the last 72 hours. Anemia Panel: No results for input(s): VITAMINB12, FOLATE, FERRITIN, TIBC, IRON, RETICCTPCT in the last 72 hours. Sepsis Labs: Recent Labs  Lab 06/26/24 2328 06/27/24 0413 06/27/24 0705 06/27/24 1138 06/27/24 1514  PROCALCITON  --   --   --  16.00  --   LATICACIDVEN 4.1* 3.3* 1.8  --  1.7    Recent Results (from the past 240 hours)  Resp panel by RT-PCR (RSV, Flu A&B, Covid) Anterior Nasal Swab     Status: Abnormal   Collection Time: 06/27/24 12:05 AM   Specimen: Anterior Nasal Swab  Result Value Ref Range Status   SARS Coronavirus 2 by RT PCR NEGATIVE NEGATIVE Final   Influenza A by PCR POSITIVE (A) NEGATIVE Final   Influenza B by PCR NEGATIVE NEGATIVE Final    Comment: (NOTE) The Xpert Xpress  SARS-CoV-2/FLU/RSV plus assay is intended as an aid in the diagnosis of influenza from Nasopharyngeal swab specimens and should not be used as a sole basis for treatment. Nasal washings and aspirates are unacceptable for Xpert Xpress SARS-CoV-2/FLU/RSV testing.  Fact Sheet for Patients: bloggercourse.com  Fact Sheet for Healthcare Providers: seriousbroker.it  This test is not yet approved or cleared by the [place]  FDA and has been authorized for detection and/or diagnosis of SARS-CoV-2 by FDA under an Emergency Use Authorization (EUA). This EUA will remain in effect (meaning this test can be used) for the duration of the COVID-19 declaration under Section 564(b)(1) of the Act, 21 U.S.C. section 360bbb-3(b)(1), unless the authorization is terminated or revoked.     Resp Syncytial Virus by PCR NEGATIVE NEGATIVE Final    Comment: (NOTE) Fact Sheet for Patients: bloggercourse.com  Fact Sheet for Healthcare Providers: seriousbroker.it  This test is not yet approved or cleared by the [place]  FDA and has been authorized for detection and/or diagnosis of SARS-CoV-2 by FDA under an Emergency Use Authorization (EUA). This EUA will remain in effect (meaning this test can be used) for the duration of the COVID-19 declaration under Section 564(b)(1) of the Act, 21 U.S.C. section 360bbb-3(b)(1), unless the authorization is terminated or revoked.  Performed at [facility], [address], [place], [place] [address]   Culture, blood (Routine X 2) w Reflex to ID Panel     Status: None (Preliminary result)   Collection Time: 06/27/24  6:57 AM   Specimen: BLOOD  Result Value Ref Range Status   Specimen Description BLOOD BLOOD LEFT HAND  Final   Special Requests   Final    BOTTLES DRAWN AEROBIC ONLY Blood Culture results may not be optimal due to an inadequate volume of blood  received in culture bottles   Culture   Final    NO GROWTH 1 DAY Performed at [facility], [address] [place], [place] [address]    Report Status PENDING  Incomplete  Culture, blood (Routine X 2) w Reflex to ID Panel     Status: None (Preliminary result)   Collection Time: 06/27/24  7:45 AM   Specimen: BLOOD  Result Value Ref Range Status   Specimen Description BLOOD RIGHT ANTECUBITAL  Final   Special Requests   Final    BOTTLES DRAWN AEROBIC AND ANAEROBIC Blood Culture adequate volume   Culture   Final    NO GROWTH 1 DAY Performed at [facility], [address] [place], [place] [address]    Report Status PENDING  Incomplete  MRSA Next Gen by PCR, Nasal     Status: None   Collection Time: 06/27/24 11:38 AM   Specimen: Nasal Mucosa; Nasal Swab  Result Value Ref Range Status   MRSA by PCR Next Gen NOT DETECTED NOT DETECTED Final    Comment: (NOTE) The GeneXpert MRSA Assay (FDA approved  for NASAL specimens only), is one component of a comprehensive MRSA colonization surveillance program. It is not intended to diagnose MRSA infection nor to guide or monitor treatment for MRSA infections. Test performance is not FDA approved in patients less than 46 years old. Performed at [facility], [address], [place], [place] [address]      Radiology Studies: CT ABDOMEN PELVIS WO CONTRAST Addendum Date: 06/28/2024 ADDENDUM REPORT: 06/28/2024 12:06 ADDENDUM: Note that gas within a bladder stone can be a sign of stone infection and close correlation for urinary tract infection recommended. Electronically Signed   By: Camellia Candle M.D.   On: 06/28/2024 12:06   Result Date: 06/28/2024 CLINICAL DATA:  Acute nonlocalized abdominal pain. EXAM: CT ABDOMEN AND PELVIS WITHOUT CONTRAST TECHNIQUE: Multidetector CT imaging of the abdomen and pelvis was performed following the standard protocol without IV contrast. RADIATION DOSE REDUCTION: This exam was performed according  to the departmental dose-optimization program which includes automated exposure control, adjustment of the mA and/or kV according to patient size and/or use of iterative reconstruction technique. COMPARISON:  11/18/2022 FINDINGS: Lower chest: Patchy airspace disease in lung bases is associated with small left pleural effusion. Hepatobiliary: Tiny low-density posterior right hepatic lobe lesion is too small to characterize but statistically is most likely benign. No followup imaging is recommended. Small calcified gallstones evident. Gallbladder otherwise unremarkable. Duodenum is normally positioned as is the ligament of Treitz. Pancreas: No focal mass lesion. No dilatation of the main duct. No intraparenchymal cyst. No peripancreatic edema. Spleen: No splenomegaly. No suspicious focal mass lesion. Adrenals/Urinary Tract: No adrenal nodule or mass. Right kidney is atrophic. Mild left hydroureteronephrosis evident. Upper pole calyx in the left kidney is filled with high density material (axial 26/2 and coronal 89/6). Left ureter is dilated down to the UVJ with no obstructing calcified stone evident. 4.9 x 3.9 x 3.0 cm lamellated structure is identified in the non dependent bladder lumen, apparently representing a lamellated stone with internal gas. Stomach/Bowel: Surgical changes are noted in the stomach. Duodenum is normally positioned as is the ligament of Treitz. Duodenal diverticulum noted. No small bowel wall thickening. No small bowel dilatation. The terminal ileum is normal. The appendix is normal. No gross colonic mass. No colonic wall thickening. Vascular/Lymphatic: There is moderate atherosclerotic calcification of the abdominal aorta without aneurysm. There is no gastrohepatic or hepatoduodenal ligament lymphadenopathy. No retroperitoneal or mesenteric lymphadenopathy. No pelvic sidewall lymphadenopathy. Reproductive: Hysterectomy.  There is no adnexal mass. Other: No intraperitoneal free fluid.  Musculoskeletal: No worrisome lytic or sclerotic osseous abnormality. IMPRESSION: 1. Mild left hydroureteronephrosis with no obstructing calcified stone evident. Given lack of identifiable stone, mucosal lesion in the UVJ must be excluded. 2. Upper pole calyx in the left kidney is filled with high density material, potentially blood products or infectious debris. Urothelial neoplasm could have this appearance. Urology consultation recommended. 3. 4.9 x 3.9 x 3.0 cm lamellated structure in the non dependent bladder lumen, apparently representing a lamellated stone with internal gas. The gas within the stone is apparently creating buoyancy. 4. Patchy airspace disease in lung bases is associated with small left pleural effusion. Imaging features could be related to atelectasis or pneumonia. 5. Cholelithiasis. 6.  Aortic Atherosclerosis (ICD10-I70.0). Electronically Signed: By: Camellia Candle M.D. On: 06/28/2024 11:57   US  EKG SITE RITE Result Date: 06/27/2024 If Site Rite image not attached, placement could not be confirmed due to current cardiac rhythm.  ECHOCARDIOGRAM COMPLETE Result Date: 06/27/2024    ECHOCARDIOGRAM REPORT  Patient Name:   Lauren Clark Date of Exam: 06/27/2024 Medical Rec #:  [id number]    Height:       65.0 in Accession #:    [id number]   Weight:       202.0 lb Date of Birth:  Oct 30, 1955    BSA:          1.987 m Patient Age:    68 years     BP:           105/52 mmHg Patient Gender: F            HR:           56 bpm. Exam Location:  Inpatient Procedure: 2D Echo, Cardiac Doppler, Color Doppler and Intracardiac            Opacification Agent (Both Spectral and Color Flow Doppler were            utilized during procedure). STAT ECHO Indications:    Dyspnea  History:        Patient has prior history of Echocardiogram examinations, most                 recent 05/20/2024. CHF, COPD; Risk Factors:Hypertension and                 Dyslipidemia.  Sonographer:    Philomena Daring Referring Phys: JONATHAN F  BRANCH  Sonographer Comments: Patient is obese. IMPRESSIONS  1. Apical akinesis, pattern could suggest stress induced cardiomyopathy. Cannot exlcude possible LAD ischemia. . Left ventricular ejection fraction, by estimation, is 30 to 35%. The left ventricle has moderately decreased function. The left ventricle demonstrates regional wall motion abnormalities (see scoring diagram/findings for description). There is moderate left ventricular hypertrophy. Left ventricular diastolic parameters are consistent with Grade II diastolic dysfunction (pseudonormalization). Elevated left atrial pressure.  2. Right ventricular systolic function is normal. The right ventricular size is normal.  3. The mitral valve is abnormal. Trivial mitral valve regurgitation. No evidence of mitral stenosis.  4. The aortic valve was not well visualized. There is mild calcification of the aortic valve. There is mild thickening of the aortic valve. Aortic valve regurgitation is moderate.  5. The inferior vena cava is normal in size with <50% respiratory variability, suggesting right atrial pressure of 8 mmHg. FINDINGS  Left Ventricle: Apical akinesis, pattern could suggest stress induced cardiomyopathy. Cannot exlcude possible LAD ischemia. Left ventricular ejection fraction, by estimation, is 30 to 35%. The left ventricle has moderately decreased function. The left ventricle demonstrates regional wall motion abnormalities. Definity  contrast agent was given IV to delineate the left ventricular endocardial borders. The left ventricular internal cavity size was normal in size. There is moderate left ventricular hypertrophy. Left ventricular diastolic parameters are consistent with Grade II diastolic dysfunction (pseudonormalization). Elevated left atrial pressure.  LV Wall Scoring: The mid and distal anterior septum, apical lateral segment, apical anterior segment, and apex are akinetic. Right Ventricle: The right ventricular size is normal. Right  vetricular wall thickness was not well visualized. Right ventricular systolic function is normal. Left Atrium: Left atrial size was not well visualized. Right Atrium: Right atrial size was not well visualized. Pericardium: There is no evidence of pericardial effusion. Mitral Valve: The mitral valve is abnormal. Trivial mitral valve regurgitation. No evidence of mitral valve stenosis. Tricuspid Valve: The tricuspid valve is not well visualized. Tricuspid valve regurgitation is trivial. No evidence of tricuspid stenosis. Aortic Valve: The aortic valve was not well visualized. There is mild  calcification of the aortic valve. There is mild thickening of the aortic valve. There is mild aortic valve annular calcification. Aortic valve regurgitation is moderate. Aortic valve mean gradient measures 4.1 mmHg. Aortic valve peak gradient measures 10.4 mmHg. Aortic valve area, by VTI measures 2.79 cm. Pulmonic Valve: The pulmonic valve was not well visualized. Pulmonic valve regurgitation is not visualized. No evidence of pulmonic stenosis. Aorta: The aortic root was not well visualized. Venous: The inferior vena cava is normal in size with less than 50% respiratory variability, suggesting right atrial pressure of 8 mmHg. IAS/Shunts: No atrial level shunt detected by color flow Doppler.  LEFT VENTRICLE PLAX 2D LVIDd:         5.10 cm   Diastology LVIDs:         3.60 cm   LV e' medial:    5.33 cm/s LV PW:         1.20 cm   LV E/e' medial:  16.2 LV IVS:        1.30 cm   LV e' lateral:   7.83 cm/s LVOT diam:     1.90 cm   LV E/e' lateral: 11.0 LV SV:         86 LV SV Index:   43 LVOT Area:     2.84 cm  RIGHT VENTRICLE             IVC RV S prime:     16.50 cm/s  IVC diam: 2.10 cm TAPSE (M-mode): 2.2 cm LEFT ATRIUM             Index        RIGHT ATRIUM           Index LA diam:        4.10 cm 2.06 cm/m   RA Area:     16.20 cm LA Vol (A2C):   46.7 ml 23.51 ml/m  RA Volume:   35.50 ml  17.87 ml/m LA Vol (A4C):   52.6 ml 26.48  ml/m LA Biplane Vol: 51.0 ml 25.67 ml/m  AORTIC VALVE AV Area (Vmax):    2.27 cm AV Area (Vmean):   2.68 cm AV Area (VTI):     2.79 cm AV Vmax:           161.13 cm/s AV Vmean:          90.612 cm/s AV VTI:            0.309 m AV Peak Grad:      10.4 mmHg AV Mean Grad:      4.1 mmHg LVOT Vmax:         129.00 cm/s LVOT Vmean:        85.600 cm/s LVOT VTI:          0.304 m LVOT/AV VTI ratio: 0.98 MITRAL VALVE MV Area (PHT): 4.57 cm    SHUNTS MV Decel Time: 166 msec    Systemic VTI:  0.30 m MV E velocity: 86.20 cm/s  Systemic Diam: 1.90 cm MV A velocity: 59.80 cm/s MV E/A ratio:  1.44 Dorn Ross MD Electronically signed by Dorn Ross MD Signature Date/Time: 06/27/2024/9:19:25 AM    Final    CT CHEST WO CONTRAST Result Date: 06/27/2024 CLINICAL DATA:  Respiratory illness. EXAM: CT CHEST WITHOUT CONTRAST TECHNIQUE: Multidetector CT imaging of the chest was performed following the standard protocol without IV contrast. RADIATION DOSE REDUCTION: This exam was performed according to the departmental dose-optimization program which includes automated exposure control, adjustment of the  mA and/or kV according to patient size and/or use of iterative reconstruction technique. COMPARISON:  Chest x-ray 1 day prior.  Chest CT 04/22/2024 FINDINGS: Cardiovascular: The heart is enlarged. No substantial pericardial effusion. Coronary artery calcification is evident. Mild atherosclerotic calcification is noted in the wall of the thoracic aorta. Ascending thoracic aorta measures 4.1 cm diameter compatible with aneurysm. Mediastinum/Nodes: No mediastinal lymphadenopathy. No evidence for gross hilar lymphadenopathy although assessment is limited by the lack of intravenous contrast on the current study. The esophagus has normal imaging features. There is no axillary lymphadenopathy. Lungs/Pleura: Interval development of patchy ground-glass opacity with interlobular septal thickening in both upper lobes, left greater than  right. Confluent areas of consolidative airspace disease are seen in a patchy distribution of the mid and lower lungs bilaterally. Trace left pleural effusion evident. Upper Abdomen: Surgical changes noted in the stomach. Musculoskeletal: No worrisome lytic or sclerotic osseous abnormality. IMPRESSION: 1. Interval development of patchy ground-glass opacity with interlobular septal thickening in both upper lobes, left greater than right. Confluent areas of consolidative airspace disease are seen in a patchy distribution of the mid and lower lungs bilaterally. Imaging features are compatible with multifocal pneumonia. 2. Trace left pleural effusion. 3.  Aortic Atherosclerosis (ICD10-I70.0). Electronically Signed   By: Camellia Candle M.D.   On: 06/27/2024 06:38   US  Abdomen Limited RUQ (LIVER/GB) Result Date: 06/27/2024 CLINICAL DATA:  Epigastric pain. EXAM: ULTRASOUND ABDOMEN LIMITED RIGHT UPPER QUADRANT COMPARISON:  Ultrasound 1 week ago 06/20/2024 FINDINGS: Gallbladder: Physiologically distended. No gallbladder wall thickening. Small gallstones. No pericholecystic fluid. No sonographic Murphy sign noted by sonographer. Common bile duct: Diameter: 4 mm, normal Liver: Diffusely increased parenchymal echogenicity. The echogenic lesion on prior exam was not delineated on the current exam. Technically limited due to difficulty with patient positioning. Portal vein is patent on color Doppler imaging with normal direction of blood flow towards the liver. Other: No right upper quadrant ascites. IMPRESSION: 1. Gallstones without sonographic findings of acute cholecystitis. 2. Increased hepatic parenchymal echogenicity typical of steatosis. 3. Previous echogenic right lobe lesion is not seen on the current exam. Electronically Signed   By: Andrea Gasman M.D.   On: 06/27/2024 01:22   CT Head Wo Contrast Result Date: 06/27/2024 EXAM: CT HEAD AND CERVICAL SPINE 06/26/2024 11:53:00 PM TECHNIQUE: CT of the head and  cervical spine was performed without the administration of intravenous contrast. Multiplanar reformatted images are provided for review. Automated exposure control, iterative reconstruction, and/or weight based adjustment of the mA/kV was utilized to reduce the radiation dose to as low as reasonably achievable. COMPARISON: None available. CLINICAL HISTORY: Head trauma, minor (Age >= 65y) FINDINGS: CT HEAD BRAIN AND VENTRICLES: No acute intracranial hemorrhage. No mass effect or midline shift. No abnormal extra-axial fluid collection. No evidence of acute infarct. No hydrocephalus. A 1.1 cm calcified extra-axial dural based mass along the anterior falx is unchanged. ORBITS: No acute abnormality. SINUSES AND MASTOIDS: No acute abnormality. SOFT TISSUES AND SKULL: No acute skull fracture. No acute soft tissue abnormality. CT CERVICAL SPINE BONES AND ALIGNMENT: No acute fracture or traumatic malalignment. DEGENERATIVE CHANGES: No significant degenerative changes. SOFT TISSUES: No prevertebral soft tissue swelling. IMPRESSION: 1. No acute intracranial abnormality. 2. No acute fracture or traumatic malalignment of the cervical spine. 3. Unchanged small putative left parfalcine meningioma. Electronically signed by: Gilmore Molt 06/27/2024 12:03 AM EST RP Workstation: [id number]   CT Cervical Spine Wo Contrast Result Date: 06/27/2024 EXAM: CT HEAD AND CERVICAL SPINE 06/26/2024 11:53:00 PM TECHNIQUE: CT  of the head and cervical spine was performed without the administration of intravenous contrast. Multiplanar reformatted images are provided for review. Automated exposure control, iterative reconstruction, and/or weight based adjustment of the mA/kV was utilized to reduce the radiation dose to as low as reasonably achievable. COMPARISON: None available. CLINICAL HISTORY: Head trauma, minor (Age >= 65y) FINDINGS: CT HEAD BRAIN AND VENTRICLES: No acute intracranial hemorrhage. No mass effect or midline shift. No  abnormal extra-axial fluid collection. No evidence of acute infarct. No hydrocephalus. A 1.1 cm calcified extra-axial dural based mass along the anterior falx is unchanged. ORBITS: No acute abnormality. SINUSES AND MASTOIDS: No acute abnormality. SOFT TISSUES AND SKULL: No acute skull fracture. No acute soft tissue abnormality. CT CERVICAL SPINE BONES AND ALIGNMENT: No acute fracture or traumatic malalignment. DEGENERATIVE CHANGES: No significant degenerative changes. SOFT TISSUES: No prevertebral soft tissue swelling. IMPRESSION: 1. No acute intracranial abnormality. 2. No acute fracture or traumatic malalignment of the cervical spine. 3. Unchanged small putative left parfalcine meningioma. Electronically signed by: Gilmore Molt 06/27/2024 12:03 AM EST RP Workstation: [id number]   DG Knee Complete 4 Views Left Result Date: 06/26/2024 CLINICAL DATA:  Trauma, fall. EXAM: LEFT KNEE - COMPLETE 4+ VIEW COMPARISON:  06/06/2022 FINDINGS: Knee arthroplasty in expected alignment. Prior patellar resurfacing. No acute or periprosthetic fracture. No periprosthetic lucency. No significant joint effusion. Mild generalized soft tissue edema. IMPRESSION: Knee arthroplasty without complication or acute fracture. Electronically Signed   By: Andrea Gasman M.D.   On: 06/26/2024 23:56   DG Foot Complete Left Result Date: 06/26/2024 CLINICAL DATA:  Trauma, fall. EXAM: LEFT FOOT - COMPLETE 3+ VIEW COMPARISON:  None Available. FINDINGS: There is no evidence of fracture or dislocation. Minor talonavicular degenerative change. Dorsal soft tissue edema. IMPRESSION: Dorsal soft tissue edema. No fracture or subluxation. Electronically Signed   By: Andrea Gasman M.D.   On: 06/26/2024 23:55   DG Knee Complete 4 Views Right Result Date: 06/26/2024 CLINICAL DATA:  Trauma, fall. EXAM: RIGHT KNEE - COMPLETE 4+ VIEW COMPARISON:  11/15/2022 FINDINGS: Knee arthroplasty in expected alignment. Prior patellar resurfacing. No acute or  periprosthetic fracture. Minimal joint effusion. Mild generalized soft tissue edema. IMPRESSION: Knee arthroplasty without complication or acute fracture. Electronically Signed   By: Andrea Gasman M.D.   On: 06/26/2024 23:54   DG Chest Port 1 View Result Date: 06/26/2024 CLINICAL DATA:  Trauma, fall. EXAM: PORTABLE CHEST 1 VIEW COMPARISON:  06/17/2024 FINDINGS: Cardiomegaly is stable. Mediastinal contours are unchanged. Stable interstitial coarsening. Subsegmental atelectasis/scarring in the right mid lung. No pneumothorax. No large pleural effusion. On limited assessment, no acute osseous findings. Bilateral shoulder arthropathy. IMPRESSION: 1. No acute findings or evidence of traumatic injury. 2. Stable cardiomegaly. Electronically Signed   By: Andrea Gasman M.D.   On: 06/26/2024 23:53   DG Pelvis Portable Result Date: 06/26/2024 CLINICAL DATA:  Trauma, fall. EXAM: PORTABLE PELVIS 1-2 VIEWS COMPARISON:  06/16/2024 FINDINGS: Right hip arthroplasty in expected alignment. No acute or periprosthetic fracture. Pubic rami are intact. No pubic symphyseal or sacroiliac diastasis. Left hip osteoarthritis. IMPRESSION: Right hip arthroplasty without complication or acute fracture. Electronically Signed   By: Andrea Gasman M.D.   On: 06/26/2024 23:52    Scheduled Meds:  brimonidine   1 drop Right Eye BID   And   timolol   1 drop Right Eye BID   Chlorhexidine  Gluconate Cloth  6 each Topical Daily   diclofenac  Sodium  2 g Topical TID AC & HS   FLUoxetine   40 mg  Oral Daily   ipratropium-albuterol   3 mL Nebulization BID   latanoprost   1 drop Right Eye QHS   levothyroxine   112 mcg Oral q morning   lidocaine   1 patch Transdermal QHS   oseltamivir   30 mg Oral Daily   predniSONE   40 mg Oral Q breakfast   sodium bicarbonate   650 mg Oral BID   sodium chloride  flush  10-40 mL Intracatheter Q12H   vancomycin  variable dose per unstable renal function (pharmacist dosing)   Does not apply See admin  instructions   Continuous Infusions:  sodium chloride  Stopped (06/28/24 0401)   acetylcysteine  6.25 mg/kg/hr (06/27/24 2200)   heparin  900 Units/hr (06/28/24 1349)   piperacillin -tazobactam (ZOSYN )  IV 2.25 g (06/28/24 1122)   vancomycin        LOS: 1 day   Norval Bar, MD  [facility]  06/28/2024, 3:20 PM   "

## 2024-06-28 NOTE — Progress Notes (Signed)
 PHARMACY - ANTICOAGULATION CONSULT NOTE  Pharmacy Consult for IV heparin  Indication: chest pain/ACS  Allergies[1]  Patient Measurements: Height: 5' 5 (165.1 cm) Weight: 91.6 kg (202 lb) IBW/kg (Calculated) : 57 HEPARIN  DW (KG): 77.4  Vital Signs: Temp: 97.7 F (36.5 C) (12/26 1939) Temp Source: Oral (12/26 1939) BP: 125/79 (12/26 1939) Pulse Rate: 81 (12/26 1939)  Labs: Recent Labs    06/26/24 2323 06/26/24 2327 06/27/24 0002 06/27/24 0443 06/27/24 0705 06/27/24 1514 06/27/24 1514 06/28/24 0200 06/28/24 0531 06/28/24 0900 06/28/24 1237 06/28/24 1932 06/28/24 2130  HGB 12.2   < >  --    < > 11.6*  --   --   --  10.5*  --   --  10.9*  --   HCT 38.6   < >  --    < > 36.7  --   --   --  30.8*  --   --  33.2*  --   PLT 313  --   --   --  241  --   --   --  260  --   --  310  --   APTT  --   --   --   --   --  48*   < > 126*  --  122*  --   --  83*  LABPROT 27.4*  --   --   --   --   --   --   --   --   --   --   --   --   INR 2.4*  --   --   --   --   --   --   --   --   --   --   --   --   HEPARINUNFRC  --   --   --   --   --   --   --   --   --   --  >1.10*  --  >1.10*  CREATININE 3.08*   < >  --   --  3.36* 3.63*  --   --  3.68*  --   --   --   --   CKTOTAL  --   --  385*  --  894*  --   --   --   --   --   --   --   --    < > = values in this interval not displayed.    Estimated Creatinine Clearance: 16.1 mL/min (A) (by C-G formula based on SCr of 3.68 mg/dL (H)).   Assessment: Lauren Clark is a 68 y.o. year old female admitted on 06/26/2024 with concern for ACS. Eliquis  prior to admission for afib (LD 12/24 AM per patient). Of note, pt currently on BiPAP unable to take oral meds at this time. Pharmacy consulted to dose heparin . With presence of DOAC pta, will trend aPTT until correlates with anti-xa (heparin  level).  aPTT therapeutic (83 sec) on infusion at 900 units/hr. Heparin  level >1.1 (due to Eliquis ). No bleeding noted.  Goal of Therapy:  Heparin  level  0.3-0.7 units/ml aPTT 66-102 seconds Monitor platelets by anticoagulation protocol: Yes   Plan:  Continue heparin  at 900 units/hr.   Daily aPTT, heparin  level, CBC, and monitoring for bleeding  Thank you for allowing pharmacy to participate in this patient's care.  Vito Ralph, PharmD, BCPS Please see amion for complete clinical pharmacist phone list  06/28/2024 10:29 PM              [  1]  Allergies Allergen Reactions   Crestor  [Rosuvastatin  Calcium ] Other (See Comments)    Muscle aches     Hctz [Hydrochlorothiazide] Other (See Comments)    Pt doesn't remember   Lipitor [Atorvastatin] Other (See Comments)    Muscle aches

## 2024-06-28 NOTE — Evaluation (Signed)
 Occupational Therapy Evaluation Patient Details Name: Lauren Clark MRN: [id number] DOB: Oct 29, 1955 Today's Date: 06/28/2024   History of Present Illness   68 y.o. female presents to [facility] 06/26/24 on same day as d/c from hospital due to worsening SOB and fall. Trauma scans negative. Admitted with acute COPD exacerbation 2/2 influenza A and acute on chronic hypoxemic respiratory failure. Also with acute liver failure. PMH anemia, anxiety, aortic dissection COPD, arthritis, depression, glaucoma, blind L eye, limited vision r eye, obese, PVD, gastric sleeve, R THA, HTN, HLD, CKD III,CAD, afib on eliquis , HFpEF, aortic aneurysm.     Clinical Impressions Pt admitted with the above concerns. Pt currently with functional limitations due to the deficits listed below (see OT Problem List). Prior to admit, pt was living alone with her cat completing all ADL tasks and functional mobility at Mod I level. At time of eval, unable to transfer out of bed d/t pending L ankle X-ray with significant bruising noted. Pt will benefit from acute skilled OT to increase their safety and independence with ADL and functional mobility for ADL to facilitate discharge. Patient will benefit from continued inpatient follow up therapy, <3 hours/day.       If plan is discharge home, recommend the following:   A lot of help with walking and/or transfers;A lot of help with bathing/dressing/bathroom;Assistance with cooking/housework;Assist for transportation;Help with stairs or ramp for entrance     Functional Status Assessment   Patient has had a recent decline in their functional status and demonstrates the ability to make significant improvements in function in a reasonable and predictable amount of time.     Equipment Recommendations   BSC/3in1;Other (comment) (TBD)      Precautions/Restrictions   Precautions Precautions: Fall;Other (comment) Recall of Precautions/Restrictions: Intact Precaution/Restrictions  Comments: watch O2 sats Restrictions Weight Bearing Restrictions Per Provider Order: No     Mobility Bed Mobility Overal bed mobility: Needs Assistance Bed Mobility: Supine to Sit, Sit to Supine     Supine to sit: Mod assist, HOB elevated, Used rails Sit to supine: Mod assist, Used rails   General bed mobility comments: Increased time to complete with SpO2 dropping briefly    Transfers Overall transfer level: Needs assistance Equipment used: None    General transfer comment: Deferred transfer at this time as L ankle X-ray is pending at time of evaluation. Attempted lateral scoot towards HOB although pt unable to touch floor with right foot in turn requiring total A X2 with use of bed pad to scoot hips.      Balance Overall balance assessment: Needs assistance Sitting-balance support: Bilateral upper extremity supported, Feet unsupported Sitting balance-Leahy Scale: Fair Sitting balance - Comments: EOB sitting        ADL either performed or assessed with clinical judgement   ADL Overall ADL's : Needs assistance/impaired     Grooming: Set up;Sitting   Upper Body Bathing: Set up;Sitting   Lower Body Bathing: Total assistance;Bed level   Upper Body Dressing : Set up;Sitting   Lower Body Dressing: Total assistance;Bed level   Toilet Transfer:  (deferred)   Toileting- Clothing Manipulation and Hygiene: Total assistance;Bed level       Vision Baseline Vision/History: 1 Wears glasses;2 Legally blind Ability to See in Adequate Light: 1 Impaired Patient Visual Report: No change from baseline Vision Assessment?: Wears glasses for reading;Wears glasses for driving     Perception Perception: Not tested       Praxis Praxis: Not tested  Pertinent Vitals/Pain Pain Assessment Pain Assessment: Faces Faces Pain Scale: Hurts a little bit Pain Location: left ankle/foot when touched Pain Descriptors / Indicators: Discomfort Pain Intervention(s): Monitored during  session     Extremity/Trunk Assessment Upper Extremity Assessment Upper Extremity Assessment: Right hand dominant;RUE deficits/detail;LUE deficits/detail RUE Deficits / Details: Limited shoulder ROM although functional. MMT: shoulder flexion/abduction 4-/5. functional gross grasp. LUE Deficits / Details: Limited shoulder ROM although functional. MMT: shoulder flexion/abduction 4-/5. functional gross grasp.           Communication Communication Communication: No apparent difficulties   Cognition Arousal: Alert Behavior During Therapy: WFL for tasks assessed/performed Cognition: No apparent impairments    Following commands: Intact       Cueing  General Comments   Cueing Techniques: Verbal cues  SpO2 dropped to low to mid 80's when transitioning to sitting EOB. Bumped O2 up to 5L from 4/4.5L. With increased time and breathing techniques, pt was able to increase SpO2 to 92-93%. Nursing made aware. L ankle bruising noted with pending X-ray at time of eval.           Home Living Family/patient expects to be discharged to:: Private residence Living Arrangements: Alone   Type of Home: Apartment Home Access: Level entry;Elevator     Home Layout: One level     Bathroom Shower/Tub: Chief Strategy Officer: Handicapped height Bathroom Accessibility: Yes How Accessible: Accessible via walker Home Equipment: Rollator (4 wheels);Grab bars - tub/shower;Grab bars - toilet;Shower seat;Hand held shower head;Adaptive equipment Adaptive Equipment: Reacher;Long-handled sponge Additional Comments: Has a cat named Ellouise Bihari      Prior Functioning/Environment Prior Level of Function : Driving;Independent/Modified Independent      ADLs Comments: drives during limited hours d/t vision.    OT Problem List: Decreased strength;Decreased activity tolerance;Impaired balance (sitting and/or standing)   OT Treatment/Interventions: Self-care/ADL training;Therapeutic  exercise;DME and/or AE instruction;Energy conservation;Manual therapy;Therapeutic activities;Patient/family education;Balance training      OT Goals(Current goals can be found in the care plan section)   Acute Rehab OT Goals Patient Stated Goal: to get stronger OT Goal Formulation: With patient Time For Goal Achievement: 07/12/24 Potential to Achieve Goals: Good   OT Frequency:  Min 2X/week    Co-evaluation PT/OT/SLP Co-Evaluation/Treatment: Yes Reason for Co-Treatment: To address functional/ADL transfers   OT goals addressed during session: ADL's and self-care;Strengthening/ROM      AM-PAC OT 6 Clicks Daily Activity     Outcome Measure Help from another person eating meals?: None Help from another person taking care of personal grooming?: A Little Help from another person toileting, which includes using toliet, bedpan, or urinal?: Total Help from another person bathing (including washing, rinsing, drying)?: A Lot Help from another person to put on and taking off regular upper body clothing?: A Little Help from another person to put on and taking off regular lower body clothing?: Total 6 Click Score: 14   End of Session Equipment Utilized During Treatment: Oxygen Nurse Communication: Mobility status;Other (comment) (SpO2 during session)  Activity Tolerance: Patient tolerated treatment well Patient left: in bed;with call bell/phone within reach;with bed alarm set;with family/visitor present  OT Visit Diagnosis: Unsteadiness on feet (R26.81);Muscle weakness (generalized) (M62.81);History of falling (Z91.81)                Time: 8884-8861 OT Time Calculation (min): 23 min Charges:  OT General Charges $OT Visit: 1 Visit OT Evaluation $OT Eval Moderate Complexity: 1 Mod [employer], OTR/L,CBIS  Supplemental OT - MC and I[employer]  Secure Chat Preferred    Jakaleb Payer, Leita BIRCH 06/28/2024, 1:19 PM

## 2024-06-28 NOTE — Progress Notes (Signed)
 PHARMACY - ANTICOAGULATION CONSULT NOTE  Pharmacy Consult for IV heparin  Indication: chest pain/ACS  Allergies[1]  Patient Measurements: Height: 5' 5 (165.1 cm) Weight: 91.6 kg (202 lb) IBW/kg (Calculated) : 57 HEPARIN  DW (KG): 77.4  Vital Signs: Temp: 97.7 F (36.5 C) (12/26 0724) Temp Source: Axillary (12/26 0724) BP: 136/68 (12/26 1311) Pulse Rate: 72 (12/26 1311)  Labs: Recent Labs    06/26/24 2323 06/26/24 2327 06/27/24 0002 06/27/24 0443 06/27/24 0705 06/27/24 1514 06/28/24 0200 06/28/24 0531 06/28/24 0900 06/28/24 1237  HGB 12.2   < >  --  12.6 11.6*  --   --  10.5*  --   --   HCT 38.6   < >  --  37.0 36.7  --   --  30.8*  --   --   PLT 313  --   --   --  241  --   --  260  --   --   APTT  --   --   --   --   --  48* 126*  --  122*  --   LABPROT 27.4*  --   --   --   --   --   --   --   --   --   INR 2.4*  --   --   --   --   --   --   --   --   --   HEPARINUNFRC  --   --   --   --   --   --   --   --   --  >1.10*  CREATININE 3.08*   < >  --   --  3.36* 3.63*  --  3.68*  --   --   CKTOTAL  --   --  385*  --  894*  --   --   --   --   --    < > = values in this interval not displayed.    Estimated Creatinine Clearance: 16.1 mL/min (A) (by C-G formula based on SCr of 3.68 mg/dL (H)).   Medical History: Past Medical History:  Diagnosis Date   Anemia    s/p knee surgery   Anxiety    Aortic dissection (HCC)    Arthritis    COPD (chronic obstructive pulmonary disease) (HCC) 06/15/2024   Depression    GERD (gastroesophageal reflux disease)    Glaucoma    right eye uses drops   History of blood transfusion    [facility]   History of MRSA infection    Hyperlipidemia    Hypertension    Hypothyroidism    Impaired vision in both eyes    blind left eye, limited vision right eye   Morbid obesity (HCC)    s/p Gastric sleeve- 8'17(loss thus far approx. 100 lbs)   Peripheral vascular disease    dvt right leg   Pre-diabetes     prior to gastric sleeve    Assessment: Lauren Clark is a 68 y.o. year old female admitted on 06/26/2024 with concern for ACS. Eliquis  prior to admission for afib (LD 12/24 AM per patient). Of note, pt currently on BiPAP unable to take oral meds at this time. Pharmacy consulted to dose heparin . With presence of DOAC pta, will trend aPTT until correlates with anti-xa (heparin  level).  aPTT is supratherapeutic at 122 seconds. Lab was drawn from PICC, heparin  running in opposite arm.  No  overt bleeding or complications noted, CBC stable.  Goal of Therapy:  Heparin  level 0.3-0.7 units/ml aPTT 66-102 seconds Monitor platelets by anticoagulation protocol: Yes   Plan:  Hold IV heparin  x 1 hr, then restart at lower rate of 900 units/hr.   Daily aPTT, heparin  level, CBC, and monitoring for bleeding  Thank you for allowing pharmacy to participate in this patient's care.  Harlene Barlow, Berdine BIRCH, BCPS, BCCP Clinical Pharmacist  06/28/2024 1:25 PM   [facility] pharmacy phone numbers are listed on [email or web address]           [1]  Allergies Allergen Reactions   Crestor  [Rosuvastatin  Calcium ] Other (See Comments)    Muscle aches     Hctz [Hydrochlorothiazide] Other (See Comments)    Pt doesn't remember   Lipitor [Atorvastatin] Other (See Comments)    Muscle aches

## 2024-06-28 NOTE — Progress Notes (Signed)
 "  NAME:  Lauren Clark, MRN:  [id number], DOB:  04-26-1956, LOS: 1 ADMISSION DATE:  06/26/2024, CONSULTATION DATE:  12/25 REFERRING MD:  Dr. Cosette, CHIEF COMPLAINT:  SOB  History of Present Illness:   HPI taken from EMR review as history limited as patient is on BiPAP.  48 yoF with PMH of hypertension, hyperlipidemia, hypothyroidism, CKD 3A, COPD, chronic hypoxic respiratory failure on 2L [place], CAD, atrial fibrillation on Eliquis , chronic HFpEF, hypothyroidism, mood disorder, and ascending aortic aneurysm.  Recent hospitalization 12/13-12/24 for sepsis, pyelonephritis, AKI, ileus, mildly elevated LFTs.  Returned overnight after fall at home on the floor for ~2hrs and SOB found to be hypoxic in the 80's on her 2L.    Labs also showed worsening WBC 18.9> 35, Na 131, BUN/ sCr 56/ 3.08> 59/ 3.36, AST/ ALT 586/ 276> 5581/ 2076, normal t.bili, pBNP 32,843 (previously 8463), trop 323> 365 (previously 157), CK 894, lactic 3.3 since cleared to 1.8 after fluids, and worsening PCT 2.53> 16.  Found to be Flu A positive.   UA showing large amount of leukocytes, 21-50 RBCs, 21-50 WBCs, CXR negative, EKG non acute but noted prolonged Qtc.  CTH neg, CT c/a/p showed interval development of patchy ground glass opacity with interlobar septal thickening in both upper lobes, left greater than right, this was concerning for multifocal pneumonia.  Was placed on BiPAP for work of breathing.  Started on vancomycin  and zosyn , treated with solumedrol, nebs, mag.  Cardiology consulted, stat TTE showed EF 30 to 35%, cannot exclude possible LAD ischemia however with apical akinesis, this could suggest stress-induced cardiomyopathy, grade 2 DD, trivial MR.  EF previously 55-60% in November.  Heparin  IV ordered.  Was given lasix  with brief SBP in the 80's since improved to SBP 110-120s.  Pt remains on BIPAP, currently denying CP, oriented to self and person and following simple commands.  ABG 7.318/ 35/ 81/ 18.2.  Given concern for  developing septic and possible cardiogenic shock, PCCM consulted.  Pt is DNR/ DNI per previous chart review.   Pertinent  Medical History   Past Medical History:  Diagnosis Date   Anemia    s/p knee surgery   Anxiety    Aortic dissection (HCC)    Arthritis    COPD (chronic obstructive pulmonary disease) (HCC) 06/15/2024   Depression    GERD (gastroesophageal reflux disease)    Glaucoma    right eye uses drops   [facility] of blood transfusion    [facility]   History of MRSA infection    Hyperlipidemia    Hypertension    Hypothyroidism    Impaired vision in both eyes    blind left eye, limited vision right eye   Morbid obesity (HCC)    s/p Gastric sleeve- 8'17(loss thus far approx. 100 lbs)   Peripheral vascular disease    dvt right leg   Pre-diabetes    prior to gastric sleeve   Significant Hospital Events: Including procedures, antibiotic start and stop dates in addition to other pertinent events   12/13-12/24 for sepsis, pyelonephritis, AKI, ileus, mildly elevated LFTs 12/25 admitted for hypoxic respiratory failure on BIPAP, flu A +, AoC HF exacerbation/ sepsis, AKI, elevated LFTs  Interim History / Subjective:   Off BiPAP.  On 4 L nasal cannula  Objective    Blood pressure 123/71, pulse 76, temperature 97.7 F (36.5 C), temperature source Axillary, resp. rate (!) 21, height 5' 5 (1.651 m), weight 91.6 kg, SpO2 90%. CVP:  [  8 mmHg-10 mmHg] 8 mmHg  FiO2 (%):  [50 %-60 %] 50 % PEEP:  [8 cmH20] 8 cmH20 Pressure Support:  [15 cmH20] 15 cmH20   Intake/Output Summary (Last 24 hours) at 06/28/2024 1133 Last data filed at 06/28/2024 0446 Gross per 24 hour  Intake 1397.31 ml  Output 775 ml  Net 622.31 ml   Filed Weights   06/26/24 2317  Weight: 91.6 kg    Examination: Chronically ill-appearing woman Awake, oriented.  No distress Lungs have rhonchi right Heart rate regular rate and rhythm  Lab/imaging reviewed Significant for  BUN/creatinine 69/3.68 AST 4736, ALT 2637 WBC 30.4, hemoglobin 10.5  Resolved problem list   Assessment and Plan   Acute on chronic respiratory hypoxic respiratory failure - likely multifactorial in setting of  Flu A positive, multifocal vs viral PNA, and AoC HF Severe sepsis  R/o UTI Acute on chronic systolic HF Elevated troponin, concern for ACS Afib on Eliquis - currently SB/ SR Qtc prolongation  Transaminitis AKI on CKD NAGMA Hyponatremia  Fall- recurrent HLD Hypothyroidism GERD P:  Respiratory status improved and is on nasal cannula Continue Tamiflu , antibiotics Management of heart failure per primary team and cardiology Ongoing discussion with palliative care regarding goals of care  PCCM will be available as needed.  Please call with questions.  Signature:   Taven Strite MD [facility] See Amion for pager  If no response to pager , please call [phone] until 7pm After 7:00 pm call Elink  [phone] 06/28/2024, 11:34 AM       "

## 2024-06-28 NOTE — Progress Notes (Signed)
 Pharmacy Antibiotic Note  ED Lauren Clark is a 68 y.o. female admitted on 06/26/2024 with possible sepsis.  Pharmacy has been consulted for Vancomycin  and Zosyn  dosing.  SCr up to 3.68 (BL ~1.2), PCT 16, WBC 30.4  Vanc random level 14 mcg/ml ~34 hours post 1500mg  IV dose.  Plan: Will give vancomycin  1gm IV this evening Consider another random level in 36-48 hours Continue Zosyn  2.25gm IV q8h Will f/u Scr, micro data, and pt's clinical condition F/u ability to narrow abx when able  Height: 5' 5 (165.1 cm) Weight: 91.6 kg (202 lb) IBW/kg (Calculated) : 57  Temp (24hrs), Avg:97.6 F (36.4 C), Min:97.4 F (36.3 C), Max:97.7 F (36.5 C)  Recent Labs  Lab 06/23/24 0225 06/24/24 0740 06/25/24 0209 06/26/24 0325 06/26/24 2323 06/26/24 2327 06/26/24 2328 06/27/24 0413 06/27/24 0705 06/27/24 1514 06/28/24 0531 06/28/24 1200  WBC 21.3*  --  18.9*  --  35.1*  --   --   --  26.5*  --  30.4*  --   CREATININE 3.58*   < > 3.20*   < > 3.08* 3.20*  --   --  3.36* 3.63* 3.68*  --   LATICACIDVEN  --   --   --   --   --   --  4.1* 3.3* 1.8 1.7  --   --   VANCORANDOM  --   --   --   --   --   --   --   --   --   --   --  14   < > = values in this interval not displayed.    Estimated Creatinine Clearance: 16.1 mL/min (A) (by C-G formula based on SCr of 3.68 mg/dL (H)).    Allergies[1]  Antibiotics Zosyn  12/24 > Vanco 12/24 > Tamiflu  12/25 > (1/5)   Microbiology: 12/24 Bcx x 2: NGTD 12/25- Flu A + 12/25 MRSA PCR: negative  Vito Ralph, PharmD, BCPS Please see amion for complete clinical pharmacist phone list 06/28/2024 3:07 PM      [1]  Allergies Allergen Reactions   Crestor  [Rosuvastatin  Calcium ] Other (See Comments)    Muscle aches     Hctz [Hydrochlorothiazide] Other (See Comments)    Pt doesn't remember   Lipitor [Atorvastatin] Other (See Comments)    Muscle aches

## 2024-06-28 NOTE — Progress Notes (Signed)
 PHARMACY - ANTICOAGULATION  Pharmacy Consult for heparin  Indication: chest pain/ACS Brief A/P: aPTT supratherapeutic Decrease Heparin  rate  Allergies[1]  Patient Measurements: Height: 5' 5 (165.1 cm) Weight: 91.6 kg (202 lb) IBW/kg (Calculated) : 57 HEPARIN  DW (KG): 77.4  Vital Signs: Temp: 97.4 F (36.3 C) (12/25 2358) Temp Source: Axillary (12/25 2358) BP: 118/91 (12/25 2358) Pulse Rate: 40 (12/25 2358)  Labs: Recent Labs    06/26/24 2323 06/26/24 2327 06/27/24 0002 06/27/24 0443 06/27/24 0705 06/27/24 1514 06/28/24 0200  HGB 12.2 14.3  --  12.6 11.6*  --   --   HCT 38.6 42.0  --  37.0 36.7  --   --   PLT 313  --   --   --  241  --   --   APTT  --   --   --   --   --  48* 126*  LABPROT 27.4*  --   --   --   --   --   --   INR 2.4*  --   --   --   --   --   --   CREATININE 3.08* 3.20*  --   --  3.36* 3.63*  --   CKTOTAL  --   --  385*  --  894*  --   --     Estimated Creatinine Clearance: 16.3 mL/min (A) (by C-G formula based on SCr of 3.63 mg/dL (H)).   Assessment: 68 y.o. female with h/o Afib and Eliquis  on hold, possible ACS for heparin   Goal of Therapy:  Heparin  level 0.3-0.7 units/ml aPTT 66-102 seconds Monitor platelets by anticoagulation protocol: Yes   Plan:  Decrease Heparin  1050 units/hr  Cathlyn Arrant, PharmD, BCPS  06/28/2024 2:40 AM          [1]  Allergies Allergen Reactions   Crestor  [Rosuvastatin  Calcium ] Other (See Comments)    Muscle aches     Hctz [Hydrochlorothiazide] Other (See Comments)    Pt doesn't remember   Lipitor [Atorvastatin] Other (See Comments)    Muscle aches

## 2024-06-28 NOTE — Evaluation (Addendum)
 Physical Therapy Evaluation Patient Details Name: Lauren Clark MRN: [id number] DOB: 02/15/1956 Today's Date: 06/28/2024  History of Present Illness  69 y.o. female [facility] to [facility] on same day as d/c from hospital due to worsening SOB and fall. Trauma scans negative. Admitted with acute COPD exacerbation 2/2 influenza A and acute on chronic hypoxemic respiratory failure. Also with acute liver failure. PMH anemia, anxiety, aortic dissection COPD, arthritis, depression, glaucoma, blind L eye, limited vision r eye, obese, PVD, gastric sleeve, R THA, HTN, HLD, CKD III,CAD, afib on eliquis , HFpEF, aortic aneurysm.   Clinical Impression  PTA pt would furniture surf in the home with use of rollator in the community. Pt was re-admitted to the hospital due to falling with pt reporting tripping over her R foot. Pt required ModA for bed mobility with a long seated rest break due to becoming SOB, vitals below. Deferred OOB mobility d/t pending L ankle x-ray with significant bruising on dorsum of L foot and medial malleoli. Performed lateral scoots towards HOB with TotalAx2 and use of bed pad. Recommending <3hrs post acute rehab to prevent future falls and work towards independence with mobility. Acute PT to follow.     83% SpO2 on 4.5L after moving towards EOB 89-91% SpO2 on 5L after moving towards EOB      If plan is discharge home, recommend the following: A lot of help with walking and/or transfers;A lot of help with bathing/dressing/bathroom;Assistance with cooking/housework;Assist for transportation;Help with stairs or ramp for entrance   Can travel by private vehicle   No    Equipment Recommendations Wheelchair (measurements PT);Wheelchair cushion (measurements PT);BSC/3in1     Functional Status Assessment Patient has had a recent decline in their functional status and demonstrates the ability to make significant improvements in function in a reasonable and predictable amount of time.      Precautions / Restrictions Precautions Precautions: Fall;Other (comment) Recall of Precautions/Restrictions: Intact Precaution/Restrictions Comments: watch O2 sats Restrictions Weight Bearing Restrictions Per Provider Order: No      Mobility  Bed Mobility Overal bed mobility: Needs Assistance Bed Mobility: Supine to Sit, Sit to Supine    Supine to sit: Mod assist, HOB elevated, Used rails Sit to supine: Mod assist, Used rails   General bed mobility comments: Increased time to complete with SpO2 dropping briefly    Transfers Overall transfer level: Needs assistance Equipment used: None Transfers: Bed to chair/wheelchair/BSC      Lateral/Scoot Transfers: Total assist, +2 physical assistance, +2 safety/equipment General transfer comment: Deferred transfer at this time as L ankle X-ray is pending at time of evaluation. Attempted lateral scoot towards HOB although pt unable to touch floor with right foot in turn requiring total A X2 with use of bed pad to scoot hips.     Balance Overall balance assessment: Needs assistance Sitting-balance support: Bilateral upper extremity supported, Feet unsupported Sitting balance-Leahy Scale: Fair Sitting balance - Comments: EOB sitting       Pertinent Vitals/Pain Pain Assessment Pain Assessment: Faces Faces Pain Scale: Hurts a little bit Pain Location: dorsum of L foot and ankle Pain Descriptors / Indicators: Discomfort Pain Intervention(s): Limited activity within patient's tolerance, Monitored during session, Repositioned    Home Living Family/patient expects to be discharged to:: Private residence Living Arrangements: Alone   Type of Home: Apartment Home Access: Level entry;Elevator       Home Layout: One level Home Equipment: Rollator (4 wheels);Grab bars - tub/shower;Grab bars - toilet;Shower seat;Hand held shower head;Adaptive equipment Additional Comments:  Has a cat named Ellouise Bihari    Prior Function Prior Level of  Function : Driving;Independent/Modified Independent      Mobility Comments: furniture surfs in the home with use of rollator in the community. Reported fall at home was due to tripping over R foot ADLs Comments: drives during limited hours d/t vision.     Extremity/Trunk Assessment   Upper Extremity Assessment Upper Extremity Assessment: Defer to OT evaluation RUE Deficits / Details: Limited shoulder ROM although functional. MMT: shoulder flexion/abduction 4-/5. functional gross grasp. LUE Deficits / Details: Limited shoulder ROM although functional. MMT: shoulder flexion/abduction 4-/5. functional gross grasp.    Lower Extremity Assessment Lower Extremity Assessment: RLE deficits/detail;LLE deficits/detail RLE Deficits / Details: Hip flexion 3/5, Knee ext 4+/5 RLE Sensation: WNL LLE Deficits / Details: Hip flexion 3/5, Knee ext 4+/5, Ankle DF at least 3/5 with formal MMT deferred 2/2 pain LLE Sensation: WNL    Cervical / Trunk Assessment Cervical / Trunk Assessment: Kyphotic  Communication   Communication Communication: No apparent difficulties    Cognition Arousal: Alert Behavior During Therapy: WFL for tasks assessed/performed   PT - Cognitive impairments: No apparent impairments    Following commands: Intact       Cueing Cueing Techniques: Verbal cues     General Comments General comments (skin integrity, edema, etc.): SpO2 dropped to low to mid 80's when transitioning to sitting EOB. Bumped O2 up to 5L from 4/4.5L. With increased time and breathing techniques, pt was able to increase SpO2 to 92-93%. Nursing made aware. L ankle bruising noted with pending X-ray at time of eval.     PT Assessment Patient needs continued PT services  PT Problem List Decreased strength;Decreased activity tolerance;Decreased balance;Decreased mobility       PT Treatment Interventions DME instruction;Balance training;Gait training;Functional mobility training;Patient/family  education;Therapeutic activities;Therapeutic exercise    PT Goals (Current goals can be found in the Care Plan section)  Acute Rehab PT Goals Patient Stated Goal: to get more rehab PT Goal Formulation: With patient Time For Goal Achievement: 07/12/24 Potential to Achieve Goals: Good    Frequency Min 2X/week     Co-evaluation   Reason for Co-Treatment: To address functional/ADL transfers PT goals addressed during session: Mobility/safety with mobility;Balance OT goals addressed during session: ADL's and self-care;Strengthening/ROM       AM-PAC PT 6 Clicks Mobility  Outcome Measure Help needed turning from your back to your side while in a flat bed without using bedrails?: A Lot Help needed moving from lying on your back to sitting on the side of a flat bed without using bedrails?: A Lot Help needed moving to and from a bed to a chair (including a wheelchair)?: Total Help needed standing up from a chair using your arms (e.g., wheelchair or bedside chair)?: Total Help needed to walk in hospital room?: Total Help needed climbing 3-5 steps with a railing? : Total 6 Click Score: 8    End of Session Equipment Utilized During Treatment: Oxygen Activity Tolerance: Patient tolerated treatment well Patient left: in bed;with call bell/phone within reach;with bed alarm set Nurse Communication: Mobility status (O2 sats) PT Visit Diagnosis: Unsteadiness on feet (R26.81);Other abnormalities of gait and mobility (R26.89);Muscle weakness (generalized) (M62.81)    Time: 8884-8860 PT Time Calculation (min) (ACUTE ONLY): 24 min   Charges:   PT Evaluation $PT Eval Low Complexity: 1 Low   PT General Charges $$ ACUTE PT VISIT: 1 Visit       Kate ORN, PT, DPT Secure Chat  Preferred  Rehab Office [phone]   Kate BRAVO Wendolyn 06/28/2024, 2:29 PM

## 2024-06-29 DIAGNOSIS — G9341 Metabolic encephalopathy: Secondary | ICD-10-CM | POA: Diagnosis not present

## 2024-06-29 DIAGNOSIS — I5181 Takotsubo syndrome: Secondary | ICD-10-CM | POA: Diagnosis not present

## 2024-06-29 DIAGNOSIS — L8990 Pressure ulcer of unspecified site, unspecified stage: Secondary | ICD-10-CM | POA: Insufficient documentation

## 2024-06-29 DIAGNOSIS — R6521 Severe sepsis with septic shock: Secondary | ICD-10-CM | POA: Diagnosis not present

## 2024-06-29 DIAGNOSIS — Z66 Do not resuscitate: Secondary | ICD-10-CM | POA: Diagnosis not present

## 2024-06-29 DIAGNOSIS — J1008 Influenza due to other identified influenza virus with other specified pneumonia: Secondary | ICD-10-CM | POA: Diagnosis not present

## 2024-06-29 DIAGNOSIS — Z515 Encounter for palliative care: Secondary | ICD-10-CM | POA: Diagnosis not present

## 2024-06-29 DIAGNOSIS — I5020 Unspecified systolic (congestive) heart failure: Secondary | ICD-10-CM

## 2024-06-29 DIAGNOSIS — K7200 Acute and subacute hepatic failure without coma: Secondary | ICD-10-CM | POA: Diagnosis not present

## 2024-06-29 DIAGNOSIS — J159 Unspecified bacterial pneumonia: Secondary | ICD-10-CM | POA: Diagnosis not present

## 2024-06-29 DIAGNOSIS — I5043 Acute on chronic combined systolic (congestive) and diastolic (congestive) heart failure: Secondary | ICD-10-CM | POA: Diagnosis not present

## 2024-06-29 DIAGNOSIS — D689 Coagulation defect, unspecified: Secondary | ICD-10-CM | POA: Diagnosis not present

## 2024-06-29 DIAGNOSIS — J9621 Acute and chronic respiratory failure with hypoxia: Secondary | ICD-10-CM | POA: Diagnosis not present

## 2024-06-29 DIAGNOSIS — A419 Sepsis, unspecified organism: Secondary | ICD-10-CM | POA: Diagnosis not present

## 2024-06-29 DIAGNOSIS — L89812 Pressure ulcer of head, stage 2: Secondary | ICD-10-CM | POA: Diagnosis not present

## 2024-06-29 LAB — BASIC METABOLIC PANEL WITH GFR
Anion gap: 25 — ABNORMAL HIGH (ref 5–15)
BUN: 75 mg/dL — ABNORMAL HIGH (ref 8–23)
CO2: 19 mmol/L — ABNORMAL LOW (ref 22–32)
Calcium: 8.4 mg/dL — ABNORMAL LOW (ref 8.9–10.3)
Chloride: 95 mmol/L — ABNORMAL LOW (ref 98–111)
Creatinine, Ser: 3.4 mg/dL — ABNORMAL HIGH (ref 0.44–1.00)
GFR, Estimated: 14 mL/min — ABNORMAL LOW
Glucose, Bld: 248 mg/dL — ABNORMAL HIGH (ref 70–99)
Potassium: 3.7 mmol/L (ref 3.5–5.1)
Sodium: 139 mmol/L (ref 135–145)

## 2024-06-29 LAB — CBC WITH DIFFERENTIAL/PLATELET
Abs Immature Granulocytes: 0.25 K/uL — ABNORMAL HIGH (ref 0.00–0.07)
Basophils Absolute: 0 K/uL (ref 0.0–0.1)
Basophils Relative: 0 %
Eosinophils Absolute: 0 K/uL (ref 0.0–0.5)
Eosinophils Relative: 0 %
HCT: 32.6 % — ABNORMAL LOW (ref 36.0–46.0)
Hemoglobin: 11 g/dL — ABNORMAL LOW (ref 12.0–15.0)
Immature Granulocytes: 1 %
Lymphocytes Relative: 3 %
Lymphs Abs: 0.8 K/uL (ref 0.7–4.0)
MCH: 30.2 pg (ref 26.0–34.0)
MCHC: 33.7 g/dL (ref 30.0–36.0)
MCV: 89.6 fL (ref 80.0–100.0)
Monocytes Absolute: 0.8 K/uL (ref 0.1–1.0)
Monocytes Relative: 3 %
Neutro Abs: 23.3 K/uL — ABNORMAL HIGH (ref 1.7–7.7)
Neutrophils Relative %: 93 %
Platelets: 304 K/uL (ref 150–400)
RBC: 3.64 MIL/uL — ABNORMAL LOW (ref 3.87–5.11)
RDW: 15.8 % — ABNORMAL HIGH (ref 11.5–15.5)
WBC: 25.2 K/uL — ABNORMAL HIGH (ref 4.0–10.5)
nRBC: 0 % (ref 0.0–0.2)

## 2024-06-29 LAB — HEPATIC FUNCTION PANEL
ALT: 2069 U/L — ABNORMAL HIGH (ref 0–44)
AST: 1888 U/L — ABNORMAL HIGH (ref 15–41)
Albumin: 2.8 g/dL — ABNORMAL LOW (ref 3.5–5.0)
Alkaline Phosphatase: 249 U/L — ABNORMAL HIGH (ref 38–126)
Bilirubin, Direct: 0.7 mg/dL — ABNORMAL HIGH (ref 0.0–0.2)
Indirect Bilirubin: 0.5 mg/dL (ref 0.3–0.9)
Total Bilirubin: 1.1 mg/dL (ref 0.0–1.2)
Total Protein: 5.9 g/dL — ABNORMAL LOW (ref 6.5–8.1)

## 2024-06-29 LAB — APTT: aPTT: 78 s — ABNORMAL HIGH (ref 24–36)

## 2024-06-29 LAB — PROCALCITONIN: Procalcitonin: 8.49 ng/mL

## 2024-06-29 LAB — HEPARIN LEVEL (UNFRACTIONATED): Heparin Unfractionated: >1.1 [IU]/mL — ABNORMAL HIGH (ref 0.30–0.70)

## 2024-06-29 MED ORDER — AMIODARONE HCL IN DEXTROSE 360-4.14 MG/200ML-% IV SOLN
30.0000 mg/h | INTRAVENOUS | Status: DC
  Administered 2024-06-29 – 2024-07-05 (×15): 30 mg/h via INTRAVENOUS
  Filled 2024-06-29 (×4): qty 200

## 2024-06-29 MED ORDER — GUAIFENESIN ER 600 MG PO TB12
600.0000 mg | ORAL_TABLET | Freq: Two times a day (BID) | ORAL | Status: AC
  Administered 2024-06-29 – 2024-07-08 (×18): 600 mg via ORAL
  Filled 2024-06-29 (×5): qty 1

## 2024-06-29 MED ORDER — AMIODARONE HCL IN DEXTROSE 360-4.14 MG/200ML-% IV SOLN
60.0000 mg/h | INTRAVENOUS | Status: AC
  Administered 2024-06-29 (×2): 60 mg/h via INTRAVENOUS
  Filled 2024-06-29: qty 200

## 2024-06-29 MED ORDER — AMIODARONE LOAD VIA INFUSION
150.0000 mg | Freq: Once | INTRAVENOUS | Status: AC
  Administered 2024-06-29: 150 mg via INTRAVENOUS
  Filled 2024-06-29: qty 83.34

## 2024-06-29 MED ORDER — HEPARIN SODIUM (PORCINE) 5000 UNIT/ML IJ SOLN
5000.0000 [IU] | Freq: Once | INTRAMUSCULAR | Status: AC
  Administered 2024-06-29: 5000 [IU] via SUBCUTANEOUS
  Filled 2024-06-29: qty 1

## 2024-06-29 MED ORDER — CALCIUM CARBONATE ANTACID 500 MG PO CHEW
1.0000 | CHEWABLE_TABLET | Freq: Three times a day (TID) | ORAL | Status: DC | PRN
  Administered 2024-06-29 – 2024-07-02 (×2): 200 mg via ORAL
  Filled 2024-06-29: qty 1

## 2024-06-29 MED ORDER — PROCHLORPERAZINE MALEATE 5 MG PO TABS
5.0000 mg | ORAL_TABLET | Freq: Four times a day (QID) | ORAL | Status: DC | PRN
  Administered 2024-07-01 – 2024-07-03 (×3): 5 mg via ORAL
  Filled 2024-06-29: qty 1

## 2024-06-29 NOTE — Progress Notes (Signed)
 I was notified by the nurse that the patient went into AF with RVR with rates in the 140s. Patient is asymptomatic from the AF standpoint. BP 99/71. She's been on AC. Given marginal BP, will start IV amiodarone  with a bolus over 30 minutes followed by gtt. Other options (BB, CCB, digoxin ) are limited given marginal BP and renal failure.

## 2024-06-29 NOTE — Progress Notes (Signed)
 " PROGRESS NOTE    Lauren Clark  FMW:[id number] DOB: 12/28/1955 DOA: 06/26/2024 PCP: [name], MD  Subjective: Noted to have asymptomatic tachycardia overnight with EKG confirming Afib with RVR for which given amiodarone  bolus followed by drip. Seen and examined at bedside. Reports feeling better. Tolerating oral intake without n/v. Denies constipation.   Hospital Course:  As per H&P: 68 y.o. female with medical history significant of COPD, chronic hypoxemic respiratory failure on 2 L home oxygen, CAD, A-fib on Eliquis , HFpEF, ascending aortic aneurysm followed by cardiothoracic surgery, hypertension, hyperlipidemia, hypothyroidism, CKD stage IIIa, anemia, anxiety, depression, GERD, glaucoma, vision impairment, morbid obesity status post gastric sleeve surgery, PVD.  Recent hospital admission 12/13-12/24 for AKI, pyelonephritis, ileus, mildly elevated LFTs, and acute on chronic hypoxemic respiratory failure secondary to CHF exacerbation. Creatinine had peaked to 3.86.  Her diuretics were held and she was given IV albumin .  Creatinine improved to 2.97 at the time of discharge yesterday and nephrology resumed Lasix ..  Completed 7 days of Rocephin  for pyelonephritis.  She was evaluated by physical therapy and discharged with home health PT/OT yesterday afternoon.  Patient returned to the ED via EMS in the evening due to concern for shortness of breath, fall at home, and possible syncope.  She was saturating in the 80s on her home 2 L [place] with EMS.  She was wheezing on exam and given given albuterol  treatment by EMS.  Patient noted to have bruising on her left foot and complained of knee pain as well.  In the ED, patient was initially saturating well on 4.5 L [place] but after RN laid her down in the bed to place bedpan, patient desaturated to the mid 80s and oxygen saturation did not improve with nonrebreather.  She became tachypneic to the 30s and was subsequently placed on BiPAP with improvement of work  of breathing and improvement of oxygen saturation to the 90s.  Afebrile.  Labs showing worsening leukocytosis (WBC count now 35.1), sodium 131, bicarb 17, anion gap 16, glucose 160, BUN 56, creatinine 3.0, AST 586, ALT 276, alk phos 203, T. bili 1.2, lactic acid 4.1>3.3, CK 385, proBNP 8463, influenza A PCR positive, no blood cultures drawn.  UA showing large amount of leukocytes, 21-50 RBCs, 21-50 WBCs, and many bacteria EKG showing sinus or ectopic atrial rhythm with borderline PR prolongation, QTc 531, and no acute ischemic changes.  Troponin 157.  X-rays of pelvis, bilateral knees, and left foot negative for fractures.  Chest x-ray showing no acute findings or evidence of traumatic injury.  CT head/C-spine negative for acute findings.  Right upper quadrant abdominal ultrasound showing gallstones without sonographic findings of acute cholecystitis.  Showing increased hepatic parenchymal echogenicity typical of steatosis.  Patient was given Solu-Medrol  125 mg, vancomycin , Zosyn , IV mag 2 g, and continuous albuterol  neb treatment in the ED.   Assessment and Plan:  Acute on chronic hypoxemic respiratory failure Patient was noted to be saturating in the 80s on her home 2 L [place] with EMS.  She was wheezing with EMS and was given albuterol  treatment. - currently on 4L < BiPAP, on 2L via [place] at home - etiology unclear, likely multifactorial in the setting of acute COPD exacerbation, acute influenza A infection with superimposed bacterial PNA, acute CHF exacerbation - less likely acute PE as already on anticoagulation  - Blood gas without evidence of hypercapnia.   - see management of active co-morbidities as elsewhere - wean O2 as tolerated    Acute  COPD exacerbation - status post albuterol  nebs, Solu-Medrol  125 mg, and IV mag 2 g in the ED.  - switched IV solumedrol to prednisone , plan for 5 days total steroid course  - cont duonebs ATC - cont albuterol  PRN   Sepsis Acute influenza A  infection Possible bacterial PNA Possible complicated UTI - Recently finished 7-day course of Rocephin  for pyelonephritis. Prior Ucx with polymicrobial growth at that time - UA continues to show evidence of pyuria and bacteria.  - Influenza A PCR positive.  - MRSA negative - procalcitonin 8 < 16 - CXR with no any acute findings but stable cardiomegaly - CT chest w/o contrast showed interval development of patchy ground-glass opacity with interlobular septal thickening in both upper lobes, L > R. Confluent areas of consolidative airspace disease are seen in a patchy distribution of the mid and lower lungs bilaterally. Imaging features are compatible with multifocal pneumonia. - CT abdomen pelvis w/o contrast showed mild L hydroureteronephrosis without stone, high density material at upper pole calyx in L kidney that maybe blood product/infectious lesion/neoplasm, lamellated stone with internal gas in urinary bladder, cholelithiasis  - UCx with NGTD, final read pending - BCx with NGTD, final read pending  - cont oseltamivir  - stop vancomycin  - cont zosyn , duration TBD - may need urologic stent placement, if patient agreeable to this procedure - urology following - palliative following   Possible acute on chronic HFpEF Hypotension - clinically fluid overload status improving - CXR with no any acute findings but stable cardiomegaly - proBNP 32843 < 8463 - TTE showed apical akinesis concerning for stress induced cardiomyopathy but cannot exclude LAD distribution ischemia, LVEF 30-35%, RV size/systolic function normal, IVC normal size with <50% variability suggesting RAP - given lasix  40mg  IV once in the ED - one dose IV lasix  80mg  12/26 by cardio - seen by PCCM. Patient wants to avoid procedures including central line placement and wants to try and avoid ICU level of care.  - cont external foley for accurate I/Os - daily weights - monitor on tele - cardiology following - palliative  following   Paroxysmal Atrial fibrillation - RVR noted overnight 12/26 - started on amiodarone  given low BP - stop heparin  drip as elsewhere. No bridging needed - will need to resume eliquis  after urology procedure when ok by urology team  Elevated troponin NSTEMI History of CAD - EKG without acute ischemic changes - Trop 365 < 323 < 157 - TTE with apical akinesis - hold home eliquis   - finished 48 hours of heparin  drip for presumed ACS management  - likely no plan for cardiac catheterization, need to confirm with cardiology team - hold statin given acute liver failiure - hold metoprolol  given hypotension - monitor on tele - cardiology following   Acute liver failure  - improving - Patient is reporting history of postprandial abdominal pain for several months - Transaminases and alkaline phosphatase worse compared to labs done during recent hospitalization. AST 586, ALT 276, alk phos 203.   - etiology unclear, likely shock liver in the setting of hypotension due to possible shock state (septic vs cardiogenic) - Right upper quadrant abdominal ultrasound showed gallstones without sonographic findings of acute cholecystitis.  Showing increased hepatic parenchymal echogenicity typical of steatosis.   - Acute hepatitis panel negative  - acetaminophen  level low  - hold home statin therapy - cont NAC protocol - monitor LFTs - She will need outpatient evaluation by general surgery for consideration of cholecystectomy after her acute pulmonary  and cardiac issues resolve.    AKI on CKD stage IIIa High anion gap metabolic acidosis - Creatinine 3.4 < 3.68, 2.97 on discharge 06/26/24, baseline before last admission was around 1.2.  - concern for fluid overload state - CT A/P findings as elsewhere - status post lasix  IV dose in the ED - occasional diuresis as elsewhere - cont sodium bicarbonate  tablet - avoid nephrotoxic agents - renal dose medications - monitor I/Os, renal function   - discussed with nephrology on 12/24 who has stated they will continue to follow - urology following   Lactic acidosis - likely in the setting of hypotension due to possible shock state (septic vs cardiogenic), NSTEMI and acute liver failure - resolved now - monitor clinically   QT prolongation QTc 531 on EKG.   - amiodarone  as elsewhere - Monitor potassium and magnesium  levels.  -  Avoid QT prolonging drugs if possible.  -  Follow-up repeat EKG.   Hyperlipidemia - hold statin therapy given acute liver failure   Mild chronic hyponatremia - resolved - asymptomatic - likely due to hypervolemia.  - status post intermittent IV Lasix  doses - monitor BMP  Hypothyroidism - cont levothyroxine    Anxiety and depression - cont home fluoxetine    Back pain Dengenerative disc disease Mechanical Fall - pain seems chronic in nature - No traumatic injuries identified on imaging of chest, pelvis, bilateral knees, left foot, head, and C-spine.  Fall precautions.  - Xray T- and L-spine showed diffuse degenerative disc disease - cannot give acetaminophen  given liver failure - want to avoid systemic NSAIDs given AKI on CKD - cont voltaren  gel - cont lidocaine  patch - can consider one time doses of opioids if pain unbearable - PT/OT following  DVT prophylaxis:   SQ Heparin    Code Status: Limited: Do not attempt resuscitation (DNR) -DNR-LIMITED -Do Not Intubate/DNI   Disposition Plan: TBD Reason for continuing need for hospitalization: severity of illness, IV antibiotics, IV heparin , cardiology recommendations, urology recommendations, palliative recommendations, monitor renal function, PT follow up   Objective: Vitals:   06/29/24 0635 06/29/24 0816 06/29/24 1146 06/29/24 1200  BP: 111/77 101/66 105/78   Pulse: (!) 112 (!) 124 (!) 124   Resp: (!) 23 (!) 22 (!) 25 20  Temp: 97.9 F (36.6 C) 98 F (36.7 C) 97.8 F (36.6 C)   TempSrc: Oral Oral Oral   SpO2: 97% 96% 96%    Weight:      Height:        Intake/Output Summary (Last 24 hours) at 06/29/2024 1329 Last data filed at 06/29/2024 1200 Gross per 24 hour  Intake 1400.89 ml  Output 3950 ml  Net -2549.11 ml   Filed Weights   06/26/24 2317  Weight: 91.6 kg    Examination:  Physical Exam Vitals and nursing note reviewed.  Constitutional:      General: She is not in acute distress.    Appearance: She is obese. She is ill-appearing (chronically).     Comments: Weak, frail  HENT:     Head: Normocephalic and atraumatic.  Cardiovascular:     Rate and Rhythm: Normal rate and regular rhythm.     Pulses: Normal pulses.     Heart sounds: Normal heart sounds.  Pulmonary:     Effort: Pulmonary effort is normal.     Breath sounds: Normal breath sounds.  Abdominal:     General: Bowel sounds are normal.     Palpations: Abdomen is soft.  Musculoskeletal:  Right lower leg: Edema (improving) present.     Left lower leg: Edema (improving) present.  Neurological:     Mental Status: She is alert. Mental status is at baseline.     Data Reviewed: I have personally reviewed following labs and imaging studies  CBC: Recent Labs  Lab 06/25/24 0209 06/26/24 2323 06/26/24 2327 06/27/24 0443 06/27/24 0705 06/28/24 0531 06/28/24 1932 06/29/24 0554  WBC 18.9* 35.1*  --   --  26.5* 30.4* 31.2* 25.2*  NEUTROABS 15.7*  --   --   --   --  28.8*  --  23.3*  HGB 11.2* 12.2   < > 12.6 11.6* 10.5* 10.9* 11.0*  HCT 34.4* 38.6   < > 37.0 36.7 30.8* 33.2* 32.6*  MCV 90.8 94.1  --   --  94.3 90.1 92.0 89.6  PLT 248 313  --   --  241 260 310 304   < > = values in this interval not displayed.   Basic Metabolic Panel: Recent Labs  Lab 06/23/24 0225 06/24/24 0740 06/25/24 9790 06/26/24 2323 06/26/24 2327 06/27/24 0443 06/27/24 0705 06/27/24 1138 06/27/24 1514 06/28/24 0531 06/29/24 0554  NA 131* 131*   < > 131* 133* 132* 132*  --  134* 137 139  K 3.7 4.4   < > 4.7 4.6 4.2 4.7  --  4.7 4.1 3.7  CL  97* 98   < > 99 101  --  98  --  98 96* 95*  CO2 23 21*   < > 17*  --   --  19*  --  16* 17* 19*  GLUCOSE 132* 128*   < > 160* 160*  --  170*  --  229* 226* 248*  BUN 62* 65*   < > 56* 65*  --  59*  --  64* 69* 75*  CREATININE 3.58* 3.39*   < > 3.08* 3.20*  --  3.36*  --  3.63* 3.68* 3.40*  CALCIUM  8.3* 8.6*   < > 8.8*  --   --  8.7*  --  8.6* 8.4* 8.4*  MG 2.4  --   --   --   --   --  3.2* 3.1*  --   --   --   PHOS  --  4.1  --   --   --   --   --  5.7*  --   --   --    < > = values in this interval not displayed.   GFR: Estimated Creatinine Clearance: 17.5 mL/min (A) (by C-G formula based on SCr of 3.4 mg/dL (H)). Liver Function Tests: Recent Labs  Lab 06/26/24 2323 06/27/24 0705 06/27/24 1514 06/28/24 0531 06/29/24 0554  AST 586* 5,581* 6,589* 4,736* 1,888*  ALT 276* 2,076* 2,684* 2,637* 2,069*  ALKPHOS 203* 203* 189* 184* 249*  BILITOT 1.2 1.2 1.0 1.0 1.1  PROT 7.0 6.4* 6.0* 5.7* 5.9*  ALBUMIN  3.4* 3.2* 3.0* 2.9* 2.8*   No results for input(s): LIPASE, AMYLASE in the last 168 hours. No results for input(s): AMMONIA in the last 168 hours. Coagulation Profile: Recent Labs  Lab 06/26/24 2323  INR 2.4*   Cardiac Enzymes: Recent Labs  Lab 06/27/24 0002 06/27/24 0705  CKTOTAL 385* 894*   ProBNP, BNP (last 5 results) Recent Labs    01/01/24 0851 04/15/24 1032 06/17/24 0135 06/27/24 0002 06/27/24 0705  PROBNP  --   --   --  8,463.0* 32,843.0*  BNP 311.3* 179.8* 567.8*  --   --  HbA1C: No results for input(s): HGBA1C in the last 72 hours. CBG: Recent Labs  Lab 06/26/24 0425  GLUCAP 97   Lipid Profile: No results for input(s): CHOL, HDL, LDLCALC, TRIG, CHOLHDL, LDLDIRECT in the last 72 hours. Thyroid  Function Tests: No results for input(s): TSH, T4TOTAL, FREET4, T3FREE, THYROIDAB in the last 72 hours. Anemia Panel: No results for input(s): VITAMINB12, FOLATE, FERRITIN, TIBC, IRON, RETICCTPCT in the last 72  hours. Sepsis Labs: Recent Labs  Lab 06/26/24 2328 06/27/24 0413 06/27/24 0705 06/27/24 1138 06/27/24 1514 06/29/24 0500  PROCALCITON  --   --   --  16.00  --  8.49  LATICACIDVEN 4.1* 3.3* 1.8  --  1.7  --     Recent Results (from the past 240 hours)  Resp panel by RT-PCR (RSV, Flu A&B, Covid) Anterior Nasal Swab     Status: Abnormal   Collection Time: 06/27/24 12:05 AM   Specimen: Anterior Nasal Swab  Result Value Ref Range Status   SARS Coronavirus 2 by RT PCR NEGATIVE NEGATIVE Final   Influenza A by PCR POSITIVE (A) NEGATIVE Final   Influenza B by PCR NEGATIVE NEGATIVE Final    Comment: (NOTE) The Xpert Xpress SARS-CoV-2/FLU/RSV plus assay is intended as an aid in the diagnosis of influenza from Nasopharyngeal swab specimens and should not be used as a sole basis for treatment. Nasal washings and aspirates are unacceptable for Xpert Xpress SARS-CoV-2/FLU/RSV testing.  Fact Sheet for Patients: bloggercourse.com  Fact Sheet for Healthcare Providers: seriousbroker.it  This test is not yet approved or cleared by the [place]  FDA and has been authorized for detection and/or diagnosis of SARS-CoV-2 by FDA under an Emergency Use Authorization (EUA). This EUA will remain in effect (meaning this test can be used) for the duration of the COVID-19 declaration under Section 564(b)(1) of the Act, 21 U.S.C. section 360bbb-3(b)(1), unless the authorization is terminated or revoked.     Resp Syncytial Virus by PCR NEGATIVE NEGATIVE Final    Comment: (NOTE) Fact Sheet for Patients: bloggercourse.com  Fact Sheet for Healthcare Providers: seriousbroker.it  This test is not yet approved or cleared by the [place]  FDA and has been authorized for detection and/or diagnosis of SARS-CoV-2 by FDA under an Emergency Use Authorization (EUA). This EUA will remain in effect  (meaning this test can be used) for the duration of the COVID-19 declaration under Section 564(b)(1) of the Act, 21 U.S.C. section 360bbb-3(b)(1), unless the authorization is terminated or revoked.  Performed at [facility], [address] [place], [place] [address]   Culture, blood (Routine X 2) w Reflex to ID Panel     Status: None (Preliminary result)   Collection Time: 06/27/24  6:57 AM   Specimen: BLOOD LEFT HAND  Result Value Ref Range Status   Specimen Description BLOOD LEFT HAND  Final   Special Requests   Final    BOTTLES DRAWN AEROBIC ONLY Blood Culture results may not be optimal due to an inadequate volume of blood received in culture bottles   Culture   Final    NO GROWTH 2 DAYS Performed at [facility], [address], [place], [place] [address]    Report Status PENDING  Incomplete  Culture, blood (Routine X 2) w Reflex to ID Panel     Status: None (Preliminary result)   Collection Time: 06/27/24  7:45 AM   Specimen: BLOOD  Result Value Ref Range Status   Specimen Description BLOOD RIGHT ANTECUBITAL  Final  Special Requests   Final    BOTTLES DRAWN AEROBIC AND ANAEROBIC Blood Culture adequate volume   Culture   Final    NO GROWTH 2 DAYS Performed at [facility], [address] [place], [place] [address]    Report Status PENDING  Incomplete  MRSA Next Gen by PCR, Nasal     Status: None   Collection Time: 06/27/24 11:38 AM   Specimen: Nasal Mucosa; Nasal Swab  Result Value Ref Range Status   MRSA by PCR Next Gen NOT DETECTED NOT DETECTED Final    Comment: (NOTE) The GeneXpert MRSA Assay (FDA approved for NASAL specimens only), is one component of a comprehensive MRSA colonization surveillance program. It is not intended to diagnose MRSA infection nor to guide or monitor treatment for MRSA infections. Test performance is not FDA approved in patients less than 73 years old. Performed at [facility], [address], [place],  [place] [address]      Radiology Studies: DG Thoracic Spine 1 View Result Date: 06/29/2024 CLINICAL DATA:  Back pain EXAM: THORACIC SPINE 1 VIEW COMPARISON:  None Available. FINDINGS: Lateral views of the thoracic spine submitted. Slight exaggerated thoracic kyphosis. Mild diffuse degenerative disc disease with disc space narrowing and spurring. No evidence of fracture or compression deformity. IMPRESSION: Mild diffuse degenerative disc disease with only lateral view submitted. Electronically Signed   By: Andrea Gasman M.D.   On: 06/29/2024 12:14   DG Lumbar Spine 1 View Result Date: 06/29/2024 CLINICAL DATA:  Back pain.  Recurrent falls. EXAM: LUMBAR SPINE - 1 VIEW COMPARISON:  Reformats from abdominopelvic CT. FINDINGS: Single lateral view of the lumbar spine submitted. Grade 1 anterolisthesis of L3 on L4 and L4 on L5. No evidence of fracture or compression deformity. Degenerative disc disease at L3-L4, L4-L5, and L5-S1. Multilevel facet hypertrophy. IMPRESSION: 1. No evidence of fracture on single lateral view. 2. Grade 1 anterolisthesis of L3 on L4 and L4 on L5. 3. Degenerative disc disease and facet hypertrophy in the lower lumbar spine. Electronically Signed   By: Andrea Gasman M.D.   On: 06/29/2024 12:13   CT ABDOMEN PELVIS WO CONTRAST Addendum Date: 06/28/2024 ADDENDUM REPORT: 06/28/2024 12:06 ADDENDUM: Note that gas within a bladder stone can be a sign of stone infection and close correlation for urinary tract infection recommended. Electronically Signed   By: Camellia Candle M.D.   On: 06/28/2024 12:06   Result Date: 06/28/2024 CLINICAL DATA:  Acute nonlocalized abdominal pain. EXAM: CT ABDOMEN AND PELVIS WITHOUT CONTRAST TECHNIQUE: Multidetector CT imaging of the abdomen and pelvis was performed following the standard protocol without IV contrast. RADIATION DOSE REDUCTION: This exam was performed according to the departmental dose-optimization program which includes automated exposure  control, adjustment of the mA and/or kV according to patient size and/or use of iterative reconstruction technique. COMPARISON:  11/18/2022 FINDINGS: Lower chest: Patchy airspace disease in lung bases is associated with small left pleural effusion. Hepatobiliary: Tiny low-density posterior right hepatic lobe lesion is too small to characterize but statistically is most likely benign. No followup imaging is recommended. Small calcified gallstones evident. Gallbladder otherwise unremarkable. Duodenum is normally positioned as is the ligament of Treitz. Pancreas: No focal mass lesion. No dilatation of the main duct. No intraparenchymal cyst. No peripancreatic edema. Spleen: No splenomegaly. No suspicious focal mass lesion. Adrenals/Urinary Tract: No adrenal nodule or mass. Right kidney is atrophic. Mild left hydroureteronephrosis evident. Upper pole calyx in the left kidney is filled with high density  material (axial 26/2 and coronal 89/6). Left ureter is dilated down to the UVJ with no obstructing calcified stone evident. 4.9 x 3.9 x 3.0 cm lamellated structure is identified in the non dependent bladder lumen, apparently representing a lamellated stone with internal gas. Stomach/Bowel: Surgical changes are noted in the stomach. Duodenum is normally positioned as is the ligament of Treitz. Duodenal diverticulum noted. No small bowel wall thickening. No small bowel dilatation. The terminal ileum is normal. The appendix is normal. No gross colonic mass. No colonic wall thickening. Vascular/Lymphatic: There is moderate atherosclerotic calcification of the abdominal aorta without aneurysm. There is no gastrohepatic or hepatoduodenal ligament lymphadenopathy. No retroperitoneal or mesenteric lymphadenopathy. No pelvic sidewall lymphadenopathy. Reproductive: Hysterectomy.  There is no adnexal mass. Other: No intraperitoneal free fluid. Musculoskeletal: No worrisome lytic or sclerotic osseous abnormality. IMPRESSION: 1. Mild  left hydroureteronephrosis with no obstructing calcified stone evident. Given lack of identifiable stone, mucosal lesion in the UVJ must be excluded. 2. Upper pole calyx in the left kidney is filled with high density material, potentially blood products or infectious debris. Urothelial neoplasm could have this appearance. Urology consultation recommended. 3. 4.9 x 3.9 x 3.0 cm lamellated structure in the non dependent bladder lumen, apparently representing a lamellated stone with internal gas. The gas within the stone is apparently creating buoyancy. 4. Patchy airspace disease in lung bases is associated with small left pleural effusion. Imaging features could be related to atelectasis or pneumonia. 5. Cholelithiasis. 6.  Aortic Atherosclerosis (ICD10-I70.0). Electronically Signed: By: Camellia Candle M.D. On: 06/28/2024 11:57   US  EKG SITE RITE Result Date: 06/27/2024 If Site Rite image not attached, placement could not be confirmed due to current cardiac rhythm.   Scheduled Meds:  brimonidine   1 drop Right Eye BID   And   timolol   1 drop Right Eye BID   Chlorhexidine  Gluconate Cloth  6 each Topical Daily   diclofenac  Sodium  2 g Topical TID AC & HS   FLUoxetine   40 mg Oral Daily   ipratropium-albuterol   3 mL Nebulization BID   latanoprost   1 drop Right Eye QHS   levothyroxine   112 mcg Oral q morning   lidocaine   1 patch Transdermal QHS   oseltamivir   30 mg Oral Daily   predniSONE   40 mg Oral Q breakfast   sodium bicarbonate   650 mg Oral BID   sodium chloride  flush  10-40 mL Intracatheter Q12H   Continuous Infusions:  acetylcysteine  6.25 mg/kg/hr (06/28/24 2118)   amiodarone  30 mg/hr (06/29/24 1142)   heparin  900 Units/hr (06/29/24 9177)   piperacillin -tazobactam (ZOSYN )  IV 2.25 g (06/29/24 0922)     LOS: 2 days   Norval Bar, MD  [facility]  06/29/2024, 1:29 PM   "

## 2024-06-29 NOTE — H&P (View-Only) (Signed)
 "   I have been asked to see the patient by Dr. Norval Bar, for evaluation and management of left hydronephrosis, abnormality in left upper pole of kidney and foreign body in bladder.  History of present illness: 68 year old female with significant comorbidities recently admitted and discharged with pyelonephritis.  She was readmitted on 06/27/2024 for cardiogenic/septic shock.  She has acute on chronic renal failure.  She has a abnormality in her left upper pole of the kidney, which was not present 1 month prior on her MRI, she also has a abnormality in her bladder that appears to be floating, it is concentric, and fairly normal-appearing.  Finally, she has some progressive left hydronephrosis all the way down to the bladder.  There is air in her bladder as well, but she did have a Foley catheter upon her most recent admission.  The patient is in respiratory failure and on BiPAP.  She has an increased white blood cell count.  She has progressive renal dysfunction.  She is also in heart failure.  Review of systems: A 12 point comprehensive review of systems was obtained and is negative unless otherwise stated in the history of present illness.  Patient Active Problem List   Diagnosis Date Noted   Acute on chronic respiratory failure with hypoxemia (HCC) 06/27/2024   Elevated troponin 06/27/2024   UTI (urinary tract infection) 06/27/2024   Acute HFrEF (heart failure with reduced ejection fraction) (HCC) 06/27/2024   Acute renal failure superimposed on stage 3a chronic kidney disease (HCC) 06/15/2024   Pyelonephritis of left kidney 06/15/2024   COPD with acute exacerbation (HCC) 06/15/2024   AKI (acute kidney injury) 01/13/2023   Hypothyroidism 01/12/2023   Acute on chronic diastolic CHF (congestive heart failure) (HCC) 01/11/2023   Chronic heart failure with preserved ejection fraction (HFpEF) (HCC) 01/11/2023   Atrial fibrillation, chronic (HCC) 11/18/2022   Intramural aortic hematoma  (HCC) 11/18/2022   Aortic dissection (HCC) 11/12/2022   Positive hepatitis C antibody test 09/02/2019   S/P left TKA 09/05/2017   S/P total knee replacement 09/05/2017   S/P right THA, AA 06/14/2016   Prediabetes 02/29/2016   Dyslipidemia 02/29/2016   Degenerative joint disease of right hip 02/29/2016   S/P laparoscopic sleeve gastrectomy 02/29/2016   Preoperative cardiovascular examination 02/19/2016   Hyperlipidemia 02/19/2016   Class 3 obesity (HCC) 02/19/2016   Essential hypertension 02/19/2016    Medications Ordered Prior to Encounter[1]  Past Medical History:  Diagnosis Date   Anemia    s/p knee surgery   Anxiety    Aortic dissection (HCC)    Arthritis    COPD (chronic obstructive pulmonary disease) (HCC) 06/15/2024   Depression    GERD (gastroesophageal reflux disease)    Glaucoma    right eye uses drops   History of blood transfusion    [facility]   History of MRSA infection    Hyperlipidemia    Hypertension    Hypothyroidism    Impaired vision in both eyes    blind left eye, limited vision right eye   Morbid obesity (HCC)    s/p Gastric sleeve- 8'17(loss thus far approx. 100 lbs)   Peripheral vascular disease    dvt right leg   Pre-diabetes    prior to gastric sleeve    Past Surgical History:  Procedure Laterality Date   ABDOMINAL HYSTERECTOMY  1981   with bilateral BSO   BREAST BIOPSY Right 10/18/2023   MM RT BREAST BX W LOC DEV 1ST  LESION IMAGE BX SPEC STEREO GUIDE 10/18/2023 [facility]   CATARACT EXTRACTION Right 2010   [facility]   COLONOSCOPY     DEBRIDEMENT AND CLOSURE WOUND Right 07/07/2014   Dr. Renato   EYE SURGERY     bil cataraction with IOL as a child, cataract surgery late 1990- right   JOINT REPLACEMENT Right 2016   knee   KNEE DEBRIDEMENT Right 06/22/2014   Dr. Reyes Boer with wound vac   KNEE SURGERY     i&d KNEE POST REPLACEMENT   LAPAROSCOPIC GASTRIC SLEEVE RESECTION N/A  02/29/2016   Procedure: LAPAROSCOPIC GASTRIC SLEEVE RESECTION, UPPER ENDO;  Surgeon: Camellia Blush, MD;  Location: [facility];  Service: General;  Laterality: N/A;   TONSILLECTOMY     TOTAL HIP ARTHROPLASTY Right 06/14/2016   Procedure: RIGHT TOTAL HIP ARTHROPLASTY ANTERIOR APPROACH;  Surgeon: Donnice Car, MD;  Location: [facility];  Service: Orthopedics;  Laterality: Right;   TOTAL KNEE ARTHROPLASTY Left 09/05/2017   Procedure: LEFT TOTAL KNEE ARTHROPLASTY;  Surgeon: Car Donnice, MD;  Location: [facility];  Service: Orthopedics;  Laterality: Left;  Adductor Block   UPPER GI ENDOSCOPY  01/25/2016    Social History[2]  Family History  Problem Relation Age of Onset   Depression Mother    Hypertension Father    Breast cancer Neg Hx     PE: Vitals:   06/29/24 0510 06/29/24 0547 06/29/24 0635 06/29/24 0816  BP: 104/72  111/77 101/66  Pulse: (!) 120  (!) 112 (!) 124  Resp: (!) 25 (!) 23 (!) 23 (!) 22  Temp: (!) 96.4 F (35.8 C)  97.9 F (36.6 C) 98 F (36.7 C)  TempSrc: Axillary  Oral Oral  SpO2: 97%  97% 96%  Weight:      Height:       Patient appears to be in no acute distress  patient is alert and oriented x3 Atraumatic normocephalic head No cervical or supraclavicular lymphadenopathy appreciated No increased work of breathing, no audible wheezes/rhonchi Regular sinus rhythm/rate Abdomen is soft, nontender, nondistended, no CVA or suprapubic tenderness Lower extremities are symmetric without appreciable edema Grossly neurologically intact No identifiable skin lesions  Recent Labs    06/28/24 0531 06/28/24 1932 06/29/24 0554  WBC 30.4* 31.2* 25.2*  HGB 10.5* 10.9* 11.0*  HCT 30.8* 33.2* 32.6*   Recent Labs    06/27/24 1514 06/28/24 0531 06/29/24 0554  NA 134* 137 139  K 4.7 4.1 3.7  CL 98 96* 95*  CO2 16* 17* 19*  GLUCOSE 229* 226* 248*  BUN 64* 69* 75*  CREATININE 3.63* 3.68* 3.40*  CALCIUM  8.6* 8.4* 8.4*   Recent Labs    06/26/24 2323  INR 2.4*   No results  for input(s): LABURIN in the last 72 hours. Results for orders placed or performed during the hospital encounter of 06/26/24  Resp panel by RT-PCR (RSV, Flu A&B, Covid) Anterior Nasal Swab     Status: Abnormal   Collection Time: 06/27/24 12:05 AM   Specimen: Anterior Nasal Swab  Result Value Ref Range Status   SARS Coronavirus 2 by RT PCR NEGATIVE NEGATIVE Final   Influenza A by PCR POSITIVE (A) NEGATIVE Final   Influenza B by PCR NEGATIVE NEGATIVE Final    Comment: (NOTE) The Xpert Xpress SARS-CoV-2/FLU/RSV plus assay is intended as an aid in the diagnosis of influenza from Nasopharyngeal swab specimens and should not be used as a sole basis for treatment. Nasal washings and aspirates are unacceptable for Xpert  Xpress SARS-CoV-2/FLU/RSV testing.  Fact Sheet for Patients: bloggercourse.com  Fact Sheet for Healthcare Providers: seriousbroker.it  This test is not yet approved or cleared by the [place]  FDA and has been authorized for detection and/or diagnosis of SARS-CoV-2 by FDA under an Emergency Use Authorization (EUA). This EUA will remain in effect (meaning this test can be used) for the duration of the COVID-19 declaration under Section 564(b)(1) of the Act, 21 U.S.C. section 360bbb-3(b)(1), unless the authorization is terminated or revoked.     Resp Syncytial Virus by PCR NEGATIVE NEGATIVE Final    Comment: (NOTE) Fact Sheet for Patients: bloggercourse.com  Fact Sheet for Healthcare Providers: seriousbroker.it  This test is not yet approved or cleared by the [place]  FDA and has been authorized for detection and/or diagnosis of SARS-CoV-2 by FDA under an Emergency Use Authorization (EUA). This EUA will remain in effect (meaning this test can be used) for the duration of the COVID-19 declaration under Section 564(b)(1) of the Act, 21 U.S.C. section  360bbb-3(b)(1), unless the authorization is terminated or revoked.  Performed at [facility], [address], [place], [place] [address]   Culture, blood (Routine X 2) w Reflex to ID Panel     Status: None (Preliminary result)   Collection Time: 06/27/24  6:57 AM   Specimen: BLOOD LEFT HAND  Result Value Ref Range Status   Specimen Description BLOOD LEFT HAND  Final   Special Requests   Final    BOTTLES DRAWN AEROBIC ONLY Blood Culture results may not be optimal due to an inadequate volume of blood received in culture bottles   Culture   Final    NO GROWTH 1 DAY Performed at [facility], [address], [place], [place] [address]    Report Status PENDING  Incomplete  Culture, blood (Routine X 2) w Reflex to ID Panel     Status: None (Preliminary result)   Collection Time: 06/27/24  7:45 AM   Specimen: BLOOD  Result Value Ref Range Status   Specimen Description BLOOD RIGHT ANTECUBITAL  Final   Special Requests   Final    BOTTLES DRAWN AEROBIC AND ANAEROBIC Blood Culture adequate volume   Culture   Final    NO GROWTH 1 DAY Performed at [facility], [address], [place], [place] [address]    Report Status PENDING  Incomplete  MRSA Next Gen by PCR, Nasal     Status: None   Collection Time: 06/27/24 11:38 AM   Specimen: Nasal Mucosa; Nasal Swab  Result Value Ref Range Status   MRSA by PCR Next Gen NOT DETECTED NOT DETECTED Final    Comment: (NOTE) The GeneXpert MRSA Assay (FDA approved for NASAL specimens only), is one component of a comprehensive MRSA colonization surveillance program. It is not intended to diagnose MRSA infection nor to guide or monitor treatment for MRSA infections. Test performance is not FDA approved in patients less than 11 years old. Performed at [facility], [address], [place], [place] [address]     Imaging: I reviewed the patient's CT scan on her current admission.  I also reviewed the images from the MRI which  was obtained in November 2025.  I also reviewed the CT scan from May 2024.  I discussed all of these with the radiologist.  The findings are as noted in the HPI.  Key findings from her imaging: Recent filling defect/hyperdense material in her left upper pole, not present on  her MRI from 1 month prior, making this far less likely to be a malignancy, and more likely to be infectious or sloughed papilla.  She has hydronephrosis down to the bladder as well as an abnormality within the bladder, that has been present now since the MRI at the very earliest, perhaps even present in 2024.  This does appear to be floating.  It is concentric.  It may have some calcification.  Imp: Comorbid female flu positive with multifocal pneumonia and acute on chronic heart failure with progressive worsening renal function.  She has an abnormality in the left upper pole of her kidney which is unclear, but could be infection versus sloughed papilla of the kidney.  She has progressive hydronephrosis down to the level of the bladder with no clear obstructive etiology.  She has an unusual finding within her bladder, that appears to be floating.  Recommendations: Given the above, I think the patient should have a left ureteral stent placed.  However, not sure of the overall acuity of this patient and whether putting her to sleep at this time is appropriate and without significant risk.  I will discuss this with her primary team as well as cardiology and critical care before making any definitive decision.  Please make her NPO after midnight if surgery is safe and I will take her tomorrow morning to the OR.  Morene LELON Salines        [1]  No current facility-administered medications on file prior to encounter.   Current Outpatient Medications on File Prior to Encounter  Medication Sig Dispense Refill   BELSOMRA  10 MG TABS Take 1 tablet by mouth at bedtime.     brimonidine -timolol  (COMBIGAN ) 0.2-0.5 % ophthalmic solution  Place 1 drop into the right eye 2 (two) times daily.     CALCIUM  CITRATE PO Take 600 mg by mouth daily.     Cyanocobalamin  (VITAMIN B12 PO) Take 1 tablet by mouth in the morning.     ELIQUIS  5 MG TABS tablet TAKE 1 TABLET(5 MG) BY MOUTH TWICE DAILY 180 tablet 1   empagliflozin (JARDIANCE) 10 MG TABS tablet Take 10 mg by mouth daily.     FLUoxetine  (PROZAC ) 40 MG capsule Take 40 mg by mouth daily.     furosemide  (LASIX ) 40 MG tablet Take 1 tablet (40 mg total) by mouth daily. 90 tablet 3   latanoprost  (XALATAN ) 0.005 % ophthalmic solution Place 1 drop into the right eye at bedtime.      levothyroxine  (SYNTHROID ) 112 MCG tablet Take 112 mcg by mouth every morning.     magnesium  oxide (MAG-OX) 400 (240 Mg) MG tablet Take 400 mg by mouth daily.     NON FORMULARY Take 1 tablet by mouth 2 (two) times daily. Cranberry conascorib acid     Omega 3-6-9 Fatty Acids (TRIPLE OMEGA-3-6-9 PO) Take 1 capsule by mouth daily.     rosuvastatin  (CRESTOR ) 40 MG tablet Take 40 mg by mouth in the morning.     Skin Protectants, Misc. (MINERIN CREME EX) Apply 1 application  topically daily.    [2]  Social History Tobacco Use   Smoking status: Never   Smokeless tobacco: Never  Vaping Use   Vaping status: Never Used  Substance Use Topics   Alcohol use: No   Drug use: No   "

## 2024-06-29 NOTE — Progress Notes (Signed)
 Patient cleared for GA by cardiology -  Patient booked for cystolithalopaxy (bladder stone removal), left retrograde pyelogram and possible left ureteral stent placement.      Should hold heparin  at MN and make her NPO p MN.  Case scheduled for tomorrow (Sunday, 12/28) at 7:30a

## 2024-06-29 NOTE — Progress Notes (Signed)
 " Cardiology Consultation:   Patient ID: Lauren Clark MRN: [id number]; DOB: 1955/07/06  Admit date: 06/26/2024 Date of Consult: 06/29/2024  Primary Care Provider: [name], MD [facility] Cardiologist: Vinie JAYSON Maxcy, MD  [facility] Electrophysiologist:  None   Patient Profile:   Lauren Clark is a 68 y.o. female with HFpEF, CKD 3B, paroxysmal AF, morbid obesity s/p gastric sleeve, prior DVT, HTN, HLD, TAA, hypothyroidism, nonobstructive CAD who presents with ADHF in the setting of flu A+ and newly reduced LVEF (55-60%->30-35%) consistent with possible stress-induced cardiomyopathy.  Interval events:   Today she is overall less sx than the past few days.  Reasonable urine output over the past 24 hours (net -1574 yesterday).  Currently on 4 L [place] O2 and at home has been on 2 L [place] O2 since at least August of this year. Persistent AKI on CKD with urology following and planning on potential stent to alleviate obstruction of her left ureteral orifice on 06/30/2024.  Past Medical History:  Diagnosis Date   Anemia    s/p knee surgery   Anxiety    Aortic dissection (HCC)    Arthritis    COPD (chronic obstructive pulmonary disease) (HCC) 06/15/2024   Depression    GERD (gastroesophageal reflux disease)    Glaucoma    right eye uses drops   History of blood transfusion    [facility]   History of MRSA infection    Hyperlipidemia    Hypertension    Hypothyroidism    Impaired vision in both eyes    blind left eye, limited vision right eye   Morbid obesity (HCC)    s/p Gastric sleeve- 8'17(loss thus far approx. 100 lbs)   Peripheral vascular disease    dvt right leg   Pre-diabetes    prior to gastric sleeve   Past Surgical History:  Procedure Laterality Date   ABDOMINAL HYSTERECTOMY  1981   with bilateral BSO   BREAST BIOPSY Right 10/18/2023   MM RT BREAST BX W LOC DEV 1ST LESION IMAGE BX SPEC STEREO GUIDE 10/18/2023 [facility]    CATARACT EXTRACTION Right 2010   [facility]   COLONOSCOPY     DEBRIDEMENT AND CLOSURE WOUND Right 07/07/2014   Dr. Renato   EYE SURGERY     bil cataraction with IOL as a child, cataract surgery late 1990- right   JOINT REPLACEMENT Right 2016   knee   KNEE DEBRIDEMENT Right 06/22/2014   Dr. Reyes Boer with wound vac   KNEE SURGERY     i&d KNEE POST REPLACEMENT   LAPAROSCOPIC GASTRIC SLEEVE RESECTION N/A 02/29/2016   Procedure: LAPAROSCOPIC GASTRIC SLEEVE RESECTION, UPPER ENDO;  Surgeon: Camellia Blush, MD;  Location: [facility];  Service: General;  Laterality: N/A;   TONSILLECTOMY     TOTAL HIP ARTHROPLASTY Right 06/14/2016   Procedure: RIGHT TOTAL HIP ARTHROPLASTY ANTERIOR APPROACH;  Surgeon: Lauren Car, MD;  Location: [facility];  Service: Orthopedics;  Laterality: Right;   TOTAL KNEE ARTHROPLASTY Left 09/05/2017   Procedure: LEFT TOTAL KNEE ARTHROPLASTY;  Surgeon: Car Donnice, MD;  Location: [facility];  Service: Orthopedics;  Laterality: Left;  Adductor Block   UPPER GI ENDOSCOPY  01/25/2016   Inpatient Medications: Scheduled Meds:  brimonidine   1 drop Right Eye BID   And   timolol   1 drop Right Eye BID   Chlorhexidine  Gluconate Cloth  6 each Topical Daily   diclofenac  Sodium  2 g Topical  TID AC & HS   FLUoxetine   40 mg Oral Daily   ipratropium-albuterol   3 mL Nebulization BID   latanoprost   1 drop Right Eye QHS   levothyroxine   112 mcg Oral q morning   lidocaine   1 patch Transdermal QHS   oseltamivir   30 mg Oral Daily   predniSONE   40 mg Oral Q breakfast   sodium bicarbonate   650 mg Oral BID   sodium chloride  flush  10-40 mL Intracatheter Q12H   Continuous Infusions:  acetylcysteine  6.25 mg/kg/hr (06/28/24 2118)   amiodarone  30 mg/hr (06/29/24 1142)   piperacillin -tazobactam (ZOSYN )  IV 2.25 g (06/29/24 0922)   PRN Meds: albuterol , sodium chloride  flush  Allergies:   Allergies[1]  Social History:   Social History   Socioeconomic History   Marital status:  Widowed    Spouse name: Not on file   Number of children: Not on file   Years of education: 0   Highest education level: Not on file  Occupational History   Occupation: former public relations account executive    Comment: [employer]  Tobacco Use   Smoking status: Never   Smokeless tobacco: Never  Vaping Use   Vaping status: Never Used  Substance and Sexual Activity   Alcohol use: No   Drug use: No   Sexual activity: Not Currently    Birth control/protection: Surgical  Other Topics Concern   Not on file  Social History Narrative   epworth sleepiness scale = 2 (02/19/16)   Social Drivers of Health   Tobacco Use: Low Risk (06/28/2024)   Patient History    Smoking Tobacco Use: Never    Smokeless Tobacco Use: Never    Passive Exposure: Not on file  Financial Resource Strain: Not on file  Food Insecurity: Food Insecurity Present (06/27/2024)   Epic    Worried About Programme Researcher, Broadcasting/film/video in the Last Year: Sometimes true    [employer] of Food in the Last Year: Sometimes true  Transportation Needs: Unmet Transportation Needs (06/27/2024)   Epic    Lack of Transportation (Medical): Yes    Lack of Transportation (Non-Medical): No  Physical Activity: Not on file  Stress: Not on file  Social Connections: Moderately Integrated (06/27/2024)   Social Connection and Isolation Panel    Frequency of Communication with Friends and Family: More than three times a week    Frequency of Social Gatherings with Friends and Family: Once a week    Attends Religious Services: More than 4 times per year    Active Member of [employer] or Organizations: Yes    Attends Banker Meetings: More than 4 times per year    Marital Status: Widowed  Intimate Partner Violence: Not At Risk (06/27/2024)   Epic    Fear of Current or Ex-Partner: No    Emotionally Abused: No    Physically Abused: No    Sexually Abused: No  Depression (PHQ2-9): Not on file  Alcohol Screen: Not on file  Housing: Low Risk (06/27/2024)   Epic     Unable to Pay for Housing in the Last Year: No    Number of Times Moved in the Last Year: 0    Homeless in the Last Year: No  Utilities: Not At Risk (06/27/2024)   Epic    Threatened with loss of utilities: No  Health Literacy: Not on file    Family History:    Family History  Problem Relation Age of Onset   Depression Mother    Hypertension  Father    Breast cancer Neg Hx     ROS:  Review of Systems: [y] = yes, [ ]  = no      General: Weight gain [ ] ; Weight loss [ ] ; Anorexia [ ] ; Fatigue [ ] ; Fever [ ] ; Chills [ ] ; Weakness [ ]    Cardiac: Chest pain/pressure [ ] ; Resting SOB [y]; Exertional SOB [ ] ; Orthopnea [ ] ; Pedal Edema [ ] ; Palpitations [ ] ; Syncope [ ] ; Presyncope [ ] ; Paroxysmal nocturnal dyspnea [ ]    Pulmonary: Cough [ ] ; Wheezing [ ] ; Hemoptysis [ ] ; Sputum [ ] ; Snoring [ ]    GI: Vomiting [ ] ; Dysphagia [ ] ; Melena [ ] ; Hematochezia [ ] ; Heartburn [ ] ; Abdominal pain [ ] ; Constipation [ ] ; Diarrhea [ ] ; BRBPR [ ]    GU: Hematuria [ ] ; Dysuria [ ] ; Nocturia [ ]  Vascular: Pain in legs with walking [ ] ; Pain in feet with lying flat [ ] ; Non-healing sores [ ] ; Stroke [ ] ; TIA [ ] ; Slurred speech [ ] ;   Neuro: Headaches [ ] ; Vertigo [ ] ; Seizures [ ] ; Paresthesias [ ] ;Blurred vision [ ] ; Diplopia [ ] ; Vision changes [ ]    Ortho/Skin: Arthritis [ ] ; Joint pain [ ] ; Muscle pain [ ] ; Joint swelling [ ] ; Back Pain [ ] ; Rash [ ]    Psych: Depression [ ] ; Anxiety [ ]    Heme: Bleeding problems [ ] ; Clotting disorders [ ] ; Anemia [ ]    Endocrine: Diabetes [ ] ; Thyroid  dysfunction [ ]    Physical Exam/Data:   Vitals:   06/29/24 0816 06/29/24 1146 06/29/24 1200 06/29/24 1300  BP: 101/66 105/78    Pulse: (!) 124 (!) 124    Resp: (!) 22 (!) 25 20   Temp: 98 F (36.7 C) 97.8 F (36.6 C)    TempSrc: Oral Oral    SpO2: 96% 96%  96%  Weight:      Height:        Intake/Output Summary (Last 24 hours) at 06/29/2024 1532 Last data filed at 06/29/2024 1418 Gross per 24 hour  Intake  1657.23 ml  Output 3950 ml  Net -2292.77 ml      06/26/2024   11:17 PM 06/25/2024    6:03 AM 06/24/2024    5:00 AM  Last 3 Weights  Weight (lbs) 202 lb 224 lb 13.9 oz 229 lb 4.5 oz  Weight (kg) 91.627 kg 102 kg 104 kg     Body mass index is 33.61 kg/m.  General: conversant, on 4 L [place]  HEENT: normal Cardiac:  normal S1, S2; RRR; no murmur  Lungs:  clear to auscultation bilaterally, no wheezing, rhonchi or rales  Abd: soft, nontender, no hepatomegaly  Ext: no edema Musculoskeletal:  No deformities, BUE and BLE strength normal and equal Skin: warm and dry  Neuro:  CNs 2-12 intact, no focal abnormalities noted Psych:  Normal affect   EKG: The ECG (06/29/24) was personally reviewed and demonstrates: AF/RVR 126, QRS 88, QT/c 396/573  Telemetry: Telemetry was personally reviewed and demonstrates: AF/RVR 120-130s  Relevant CV Studies:  TTE Result date: 06/27/24  1. Apical akinesis, pattern could suggest stress induced cardiomyopathy.  Cannot exlcude possible LAD ischemia. . Left ventricular ejection  fraction, by estimation, is 30 to 35%. The left ventricle has moderately  decreased function. The left ventricle  demonstrates regional wall motion abnormalities (see scoring  diagram/findings for description). There is moderate left ventricular  hypertrophy. Left ventricular diastolic parameters are consistent with  Grade II diastolic dysfunction  (pseudonormalization). Elevated left atrial pressure.   2. Right ventricular systolic function is normal. The right ventricular  size is normal.   3. The mitral valve is abnormal. Trivial mitral valve regurgitation. No  evidence of mitral stenosis.   4. The aortic valve was not well visualized. There is mild calcification  of the aortic valve. There is mild thickening of the aortic valve. Aortic  valve regurgitation is moderate.   5. The inferior vena cava is normal in size with <50% respiratory  variability, suggesting right atrial  pressure of 8 mmHg.   Laboratory Data:  Chemistry Recent Labs  Lab 06/27/24 1514 06/28/24 0531 06/29/24 0554  NA 134* 137 139  K 4.7 4.1 3.7  CL 98 96* 95*  CO2 16* 17* 19*  GLUCOSE 229* 226* 248*  BUN 64* 69* 75*  CREATININE 3.63* 3.68* 3.40*  CALCIUM  8.6* 8.4* 8.4*  GFRNONAA 13* 13* 14*  ANIONGAP 20* 25* 25*    Recent Labs  Lab 06/27/24 1514 06/28/24 0531 06/29/24 0554  PROT 6.0* 5.7* 5.9*  ALBUMIN  3.0* 2.9* 2.8*  AST 6,589* 4,736* 1,888*  ALT 2,684* 2,637* 2,069*  ALKPHOS 189* 184* 249*  BILITOT 1.0 1.0 1.1   Hematology Recent Labs  Lab 06/28/24 0531 06/28/24 1932 06/29/24 0554  WBC 30.4* 31.2* 25.2*  RBC 3.42* 3.61* 3.64*  HGB 10.5* 10.9* 11.0*  HCT 30.8* 33.2* 32.6*  MCV 90.1 92.0 89.6  MCH 30.7 30.2 30.2  MCHC 34.1 32.8 33.7  RDW 15.8* 15.9* 15.8*  PLT 260 310 304   BNP Recent Labs  Lab 06/27/24 0002 06/27/24 0705  PROBNP 8,463.0* 67,156.9*   Radiology/Studies:  DG Thoracic Spine 1 View Result Date: 06/29/2024 CLINICAL DATA:  Back pain EXAM: THORACIC SPINE 1 VIEW COMPARISON:  None Available. FINDINGS: Lateral views of the thoracic spine submitted. Slight exaggerated thoracic kyphosis. Mild diffuse degenerative disc disease with disc space narrowing and spurring. No evidence of fracture or compression deformity. IMPRESSION: Mild diffuse degenerative disc disease with only lateral view submitted. Electronically Signed   By: Andrea Gasman M.D.   On: 06/29/2024 12:14   DG Lumbar Spine 1 View Result Date: 06/29/2024 CLINICAL DATA:  Back pain.  Recurrent falls. EXAM: LUMBAR SPINE - 1 VIEW COMPARISON:  Reformats from abdominopelvic CT. FINDINGS: Single lateral view of the lumbar spine submitted. Grade 1 anterolisthesis of L3 on L4 and L4 on L5. No evidence of fracture or compression deformity. Degenerative disc disease at L3-L4, L4-L5, and L5-S1. Multilevel facet hypertrophy. IMPRESSION: 1. No evidence of fracture on single lateral view. 2. Grade 1  anterolisthesis of L3 on L4 and L4 on L5. 3. Degenerative disc disease and facet hypertrophy in the lower lumbar spine. Electronically Signed   By: Andrea Gasman M.D.   On: 06/29/2024 12:13   CT ABDOMEN PELVIS WO CONTRAST Addendum Date: 06/28/2024 ADDENDUM REPORT: 06/28/2024 12:06 ADDENDUM: Note that gas within a bladder stone can be a sign of stone infection and close correlation for urinary tract infection recommended. Electronically Signed   By: Camellia Candle M.D.   On: 06/28/2024 12:06   Result Date: 06/28/2024 CLINICAL DATA:  Acute nonlocalized abdominal pain. EXAM: CT ABDOMEN AND PELVIS WITHOUT CONTRAST TECHNIQUE: Multidetector CT imaging of the abdomen and pelvis was performed following the standard protocol without IV contrast. RADIATION DOSE REDUCTION: This exam was performed according to the departmental dose-optimization program which includes automated exposure control, adjustment of the mA and/or kV according to patient size and/or use of  iterative reconstruction technique. COMPARISON:  11/18/2022 FINDINGS: Lower chest: Patchy airspace disease in lung bases is associated with small left pleural effusion. Hepatobiliary: Tiny low-density posterior right hepatic lobe lesion is too small to characterize but statistically is most likely benign. No followup imaging is recommended. Small calcified gallstones evident. Gallbladder otherwise unremarkable. Duodenum is normally positioned as is the ligament of Treitz. Pancreas: No focal mass lesion. No dilatation of the main duct. No intraparenchymal cyst. No peripancreatic edema. Spleen: No splenomegaly. No suspicious focal mass lesion. Adrenals/Urinary Tract: No adrenal nodule or mass. Right kidney is atrophic. Mild left hydroureteronephrosis evident. Upper pole calyx in the left kidney is filled with high density material (axial 26/2 and coronal 89/6). Left ureter is dilated down to the UVJ with no obstructing calcified stone evident. 4.9 x 3.9 x 3.0  cm lamellated structure is identified in the non dependent bladder lumen, apparently representing a lamellated stone with internal gas. Stomach/Bowel: Surgical changes are noted in the stomach. Duodenum is normally positioned as is the ligament of Treitz. Duodenal diverticulum noted. No small bowel wall thickening. No small bowel dilatation. The terminal ileum is normal. The appendix is normal. No gross colonic mass. No colonic wall thickening. Vascular/Lymphatic: There is moderate atherosclerotic calcification of the abdominal aorta without aneurysm. There is no gastrohepatic or hepatoduodenal ligament lymphadenopathy. No retroperitoneal or mesenteric lymphadenopathy. No pelvic sidewall lymphadenopathy. Reproductive: Hysterectomy.  There is no adnexal mass. Other: No intraperitoneal free fluid. Musculoskeletal: No worrisome lytic or sclerotic osseous abnormality. IMPRESSION: 1. Mild left hydroureteronephrosis with no obstructing calcified stone evident. Given lack of identifiable stone, mucosal lesion in the UVJ must be excluded. 2. Upper pole calyx in the left kidney is filled with high density material, potentially blood products or infectious debris. Urothelial neoplasm could have this appearance. Urology consultation recommended. 3. 4.9 x 3.9 x 3.0 cm lamellated structure in the non dependent bladder lumen, apparently representing a lamellated stone with internal gas. The gas within the stone is apparently creating buoyancy. 4. Patchy airspace disease in lung bases is associated with small left pleural effusion. Imaging features could be related to atelectasis or pneumonia. 5. Cholelithiasis. 6.  Aortic Atherosclerosis (ICD10-I70.0). Electronically Signed: By: Camellia Candle M.D. On: 06/28/2024 11:57   US  EKG SITE RITE Result Date: 06/27/2024 If Site Rite image not attached, placement could not be confirmed due to current cardiac rhythm.  ECHOCARDIOGRAM COMPLETE Result Date: 06/27/2024    ECHOCARDIOGRAM  REPORT   Patient Name:   JARIAH TARKOWSKI Date of Exam: 06/27/2024 Medical Rec #:  [id number]    Height:       65.0 in Accession #:    [id number]   Weight:       202.0 lb Date of Birth:  1955/07/18    BSA:          1.987 m Patient Age:    68 years     BP:           105/52 mmHg Patient Gender: F            HR:           56 bpm. Exam Location:  Inpatient Procedure: 2D Echo, Cardiac Doppler, Color Doppler and Intracardiac            Opacification Agent (Both Spectral and Color Flow Doppler were            utilized during procedure). STAT ECHO Indications:    Dyspnea  History:  Patient has prior history of Echocardiogram examinations, most                 recent 05/20/2024. CHF, COPD; Risk Factors:Hypertension and                 Dyslipidemia.  Sonographer:    Philomena Daring Referring Phys: JONATHAN F BRANCH  Sonographer Comments: Patient is obese. IMPRESSIONS  1. Apical akinesis, pattern could suggest stress induced cardiomyopathy. Cannot exlcude possible LAD ischemia. . Left ventricular ejection fraction, by estimation, is 30 to 35%. The left ventricle has moderately decreased function. The left ventricle demonstrates regional wall motion abnormalities (see scoring diagram/findings for description). There is moderate left ventricular hypertrophy. Left ventricular diastolic parameters are consistent with Grade II diastolic dysfunction (pseudonormalization). Elevated left atrial pressure.  2. Right ventricular systolic function is normal. The right ventricular size is normal.  3. The mitral valve is abnormal. Trivial mitral valve regurgitation. No evidence of mitral stenosis.  4. The aortic valve was not well visualized. There is mild calcification of the aortic valve. There is mild thickening of the aortic valve. Aortic valve regurgitation is moderate.  5. The inferior vena cava is normal in size with <50% respiratory variability, suggesting right atrial pressure of 8 mmHg. FINDINGS  Left Ventricle: Apical akinesis,  pattern could suggest stress induced cardiomyopathy. Cannot exlcude possible LAD ischemia. Left ventricular ejection fraction, by estimation, is 30 to 35%. The left ventricle has moderately decreased function. The left ventricle demonstrates regional wall motion abnormalities. Definity  contrast agent was given IV to delineate the left ventricular endocardial borders. The left ventricular internal cavity size was normal in size. There is moderate left ventricular hypertrophy. Left ventricular diastolic parameters are consistent with Grade II diastolic dysfunction (pseudonormalization). Elevated left atrial pressure.  LV Wall Scoring: The mid and distal anterior septum, apical lateral segment, apical anterior segment, and apex are akinetic. Right Ventricle: The right ventricular size is normal. Right vetricular wall thickness was not well visualized. Right ventricular systolic function is normal. Left Atrium: Left atrial size was not well visualized. Right Atrium: Right atrial size was not well visualized. Pericardium: There is no evidence of pericardial effusion. Mitral Valve: The mitral valve is abnormal. Trivial mitral valve regurgitation. No evidence of mitral valve stenosis. Tricuspid Valve: The tricuspid valve is not well visualized. Tricuspid valve regurgitation is trivial. No evidence of tricuspid stenosis. Aortic Valve: The aortic valve was not well visualized. There is mild calcification of the aortic valve. There is mild thickening of the aortic valve. There is mild aortic valve annular calcification. Aortic valve regurgitation is moderate. Aortic valve mean gradient measures 4.1 mmHg. Aortic valve peak gradient measures 10.4 mmHg. Aortic valve area, by VTI measures 2.79 cm. Pulmonic Valve: The pulmonic valve was not well visualized. Pulmonic valve regurgitation is not visualized. No evidence of pulmonic stenosis. Aorta: The aortic root was not well visualized. Venous: The inferior vena cava is normal in  size with less than 50% respiratory variability, suggesting right atrial pressure of 8 mmHg. IAS/Shunts: No atrial level shunt detected by color flow Doppler.  LEFT VENTRICLE PLAX 2D LVIDd:         5.10 cm   Diastology LVIDs:         3.60 cm   LV e' medial:    5.33 cm/s LV PW:         1.20 cm   LV E/e' medial:  16.2 LV IVS:        1.30 cm  LV e' lateral:   7.83 cm/s LVOT diam:     1.90 cm   LV E/e' lateral: 11.0 LV SV:         86 LV SV Index:   43 LVOT Area:     2.84 cm  RIGHT VENTRICLE             IVC RV S prime:     16.50 cm/s  IVC diam: 2.10 cm TAPSE (M-mode): 2.2 cm LEFT ATRIUM             Index        RIGHT ATRIUM           Index LA diam:        4.10 cm 2.06 cm/m   RA Area:     16.20 cm LA Vol (A2C):   46.7 ml 23.51 ml/m  RA Volume:   35.50 ml  17.87 ml/m LA Vol (A4C):   52.6 ml 26.48 ml/m LA Biplane Vol: 51.0 ml 25.67 ml/m  AORTIC VALVE AV Area (Vmax):    2.27 cm AV Area (Vmean):   2.68 cm AV Area (VTI):     2.79 cm AV Vmax:           161.13 cm/s AV Vmean:          90.612 cm/s AV VTI:            0.309 m AV Peak Grad:      10.4 mmHg AV Mean Grad:      4.1 mmHg LVOT Vmax:         129.00 cm/s LVOT Vmean:        85.600 cm/s LVOT VTI:          0.304 m LVOT/AV VTI ratio: 0.98 MITRAL VALVE MV Area (PHT): 4.57 cm    SHUNTS MV Decel Time: 166 msec    Systemic VTI:  0.30 m MV E velocity: 86.20 cm/s  Systemic Diam: 1.90 cm MV A velocity: 59.80 cm/s MV E/A ratio:  1.44 Dorn Ross MD Electronically signed by Dorn Ross MD Signature Date/Time: 06/27/2024/9:19:25 AM    Final    CT CHEST WO CONTRAST Result Date: 06/27/2024 CLINICAL DATA:  Respiratory illness. EXAM: CT CHEST WITHOUT CONTRAST TECHNIQUE: Multidetector CT imaging of the chest was performed following the standard protocol without IV contrast. RADIATION DOSE REDUCTION: This exam was performed according to the departmental dose-optimization program which includes automated exposure control, adjustment of the mA and/or kV according to  patient size and/or use of iterative reconstruction technique. COMPARISON:  Chest x-ray 1 day prior.  Chest CT 04/22/2024 FINDINGS: Cardiovascular: The heart is enlarged. No substantial pericardial effusion. Coronary artery calcification is evident. Mild atherosclerotic calcification is noted in the wall of the thoracic aorta. Ascending thoracic aorta measures 4.1 cm diameter compatible with aneurysm. Mediastinum/Nodes: No mediastinal lymphadenopathy. No evidence for gross hilar lymphadenopathy although assessment is limited by the lack of intravenous contrast on the current study. The esophagus has normal imaging features. There is no axillary lymphadenopathy. Lungs/Pleura: Interval development of patchy ground-glass opacity with interlobular septal thickening in both upper lobes, left greater than right. Confluent areas of consolidative airspace disease are seen in a patchy distribution of the mid and lower lungs bilaterally. Trace left pleural effusion evident. Upper Abdomen: Surgical changes noted in the stomach. Musculoskeletal: No worrisome lytic or sclerotic osseous abnormality. IMPRESSION: 1. Interval development of patchy ground-glass opacity with interlobular septal thickening in both upper lobes, left greater than right. Confluent areas of consolidative  airspace disease are seen in a patchy distribution of the mid and lower lungs bilaterally. Imaging features are compatible with multifocal pneumonia. 2. Trace left pleural effusion. 3.  Aortic Atherosclerosis (ICD10-I70.0). Electronically Signed   By: Camellia Candle M.D.   On: 06/27/2024 06:38   US  Abdomen Limited RUQ (LIVER/GB) Result Date: 06/27/2024 CLINICAL DATA:  Epigastric pain. EXAM: ULTRASOUND ABDOMEN LIMITED RIGHT UPPER QUADRANT COMPARISON:  Ultrasound 1 week ago 06/20/2024 FINDINGS: Gallbladder: Physiologically distended. No gallbladder wall thickening. Small gallstones. No pericholecystic fluid. No sonographic Murphy sign noted by  sonographer. Common bile duct: Diameter: 4 mm, normal Liver: Diffusely increased parenchymal echogenicity. The echogenic lesion on prior exam was not delineated on the current exam. Technically limited due to difficulty with patient positioning. Portal vein is patent on color Doppler imaging with normal direction of blood flow towards the liver. Other: No right upper quadrant ascites. IMPRESSION: 1. Gallstones without sonographic findings of acute cholecystitis. 2. Increased hepatic parenchymal echogenicity typical of steatosis. 3. Previous echogenic right lobe lesion is not seen on the current exam. Electronically Signed   By: Andrea Gasman M.D.   On: 06/27/2024 01:22   CT Head Wo Contrast Result Date: 06/27/2024 EXAM: CT HEAD AND CERVICAL SPINE 06/26/2024 11:53:00 PM TECHNIQUE: CT of the head and cervical spine was performed without the administration of intravenous contrast. Multiplanar reformatted images are provided for review. Automated exposure control, iterative reconstruction, and/or weight based adjustment of the mA/kV was utilized to reduce the radiation dose to as low as reasonably achievable. COMPARISON: None available. CLINICAL HISTORY: Head trauma, minor (Age >= 65y) FINDINGS: CT HEAD BRAIN AND VENTRICLES: No acute intracranial hemorrhage. No mass effect or midline shift. No abnormal extra-axial fluid collection. No evidence of acute infarct. No hydrocephalus. A 1.1 cm calcified extra-axial dural based mass along the anterior falx is unchanged. ORBITS: No acute abnormality. SINUSES AND MASTOIDS: No acute abnormality. SOFT TISSUES AND SKULL: No acute skull fracture. No acute soft tissue abnormality. CT CERVICAL SPINE BONES AND ALIGNMENT: No acute fracture or traumatic malalignment. DEGENERATIVE CHANGES: No significant degenerative changes. SOFT TISSUES: No prevertebral soft tissue swelling. IMPRESSION: 1. No acute intracranial abnormality. 2. No acute fracture or traumatic malalignment of the  cervical spine. 3. Unchanged small putative left parfalcine meningioma. Electronically signed by: Gilmore Molt 06/27/2024 12:03 AM EST RP Workstation: [id number]   CT Cervical Spine Wo Contrast Result Date: 06/27/2024 EXAM: CT HEAD AND CERVICAL SPINE 06/26/2024 11:53:00 PM TECHNIQUE: CT of the head and cervical spine was performed without the administration of intravenous contrast. Multiplanar reformatted images are provided for review. Automated exposure control, iterative reconstruction, and/or weight based adjustment of the mA/kV was utilized to reduce the radiation dose to as low as reasonably achievable. COMPARISON: None available. CLINICAL HISTORY: Head trauma, minor (Age >= 65y) FINDINGS: CT HEAD BRAIN AND VENTRICLES: No acute intracranial hemorrhage. No mass effect or midline shift. No abnormal extra-axial fluid collection. No evidence of acute infarct. No hydrocephalus. A 1.1 cm calcified extra-axial dural based mass along the anterior falx is unchanged. ORBITS: No acute abnormality. SINUSES AND MASTOIDS: No acute abnormality. SOFT TISSUES AND SKULL: No acute skull fracture. No acute soft tissue abnormality. CT CERVICAL SPINE BONES AND ALIGNMENT: No acute fracture or traumatic malalignment. DEGENERATIVE CHANGES: No significant degenerative changes. SOFT TISSUES: No prevertebral soft tissue swelling. IMPRESSION: 1. No acute intracranial abnormality. 2. No acute fracture or traumatic malalignment of the cervical spine. 3. Unchanged small putative left parfalcine meningioma. Electronically signed by: Gilmore Molt 06/27/2024 12:03 AM  EST RP Workstation: [id number]   DG Knee Complete 4 Views Left Result Date: 06/26/2024 CLINICAL DATA:  Trauma, fall. EXAM: LEFT KNEE - COMPLETE 4+ VIEW COMPARISON:  06/06/2022 FINDINGS: Knee arthroplasty in expected alignment. Prior patellar resurfacing. No acute or periprosthetic fracture. No periprosthetic lucency. No significant joint effusion. Mild generalized  soft tissue edema. IMPRESSION: Knee arthroplasty without complication or acute fracture. Electronically Signed   By: Andrea Gasman M.D.   On: 06/26/2024 23:56   DG Foot Complete Left Result Date: 06/26/2024 CLINICAL DATA:  Trauma, fall. EXAM: LEFT FOOT - COMPLETE 3+ VIEW COMPARISON:  None Available. FINDINGS: There is no evidence of fracture or dislocation. Minor talonavicular degenerative change. Dorsal soft tissue edema. IMPRESSION: Dorsal soft tissue edema. No fracture or subluxation. Electronically Signed   By: Andrea Gasman M.D.   On: 06/26/2024 23:55   DG Knee Complete 4 Views Right Result Date: 06/26/2024 CLINICAL DATA:  Trauma, fall. EXAM: RIGHT KNEE - COMPLETE 4+ VIEW COMPARISON:  11/15/2022 FINDINGS: Knee arthroplasty in expected alignment. Prior patellar resurfacing. No acute or periprosthetic fracture. Minimal joint effusion. Mild generalized soft tissue edema. IMPRESSION: Knee arthroplasty without complication or acute fracture. Electronically Signed   By: Andrea Gasman M.D.   On: 06/26/2024 23:54   DG Chest Port 1 View Result Date: 06/26/2024 CLINICAL DATA:  Trauma, fall. EXAM: PORTABLE CHEST 1 VIEW COMPARISON:  06/17/2024 FINDINGS: Cardiomegaly is stable. Mediastinal contours are unchanged. Stable interstitial coarsening. Subsegmental atelectasis/scarring in the right mid lung. No pneumothorax. No large pleural effusion. On limited assessment, no acute osseous findings. Bilateral shoulder arthropathy. IMPRESSION: 1. No acute findings or evidence of traumatic injury. 2. Stable cardiomegaly. Electronically Signed   By: Andrea Gasman M.D.   On: 06/26/2024 23:53   DG Pelvis Portable Result Date: 06/26/2024 CLINICAL DATA:  Trauma, fall. EXAM: PORTABLE PELVIS 1-2 VIEWS COMPARISON:  06/16/2024 FINDINGS: Right hip arthroplasty in expected alignment. No acute or periprosthetic fracture. Pubic rami are intact. No pubic symphyseal or sacroiliac diastasis. Left hip osteoarthritis.  IMPRESSION: Right hip arthroplasty without complication or acute fracture. Electronically Signed   By: Andrea Gasman M.D.   On: 06/26/2024 23:52   Assessment and Plan:  RONNISHA FELBER is a 68 y.o. female with HFpEF, CKD 3B, paroxysmal AF, morbid obesity s/p gastric sleeve, prior DVT, HTN, HLD, TAA, hypothyroidism, nonobstructive CAD who presents with ADHF in the setting of flu A+ and newly reduced LVEF (55-60%->30-35%) consistent with possible stress-induced cardiomyopathy.  HFrEF Flu A+ AKI on CKD Elevated hsT Paroxysmal AF  She seems to be doing slightly better from a heart failure perspective but continues to have AKI on CKD with CT of the abdomen showing mild left hydroureteronephrosis and high density material in the upper pole of the left kidney.  Urology recommended proceeding with left-sided stent placement to relieve potential obstruction.  She would require around 30 minutes of general anesthesia to get this done.  Although her presentation was for dyspnea and ADHF that has since significantly improved.  Her profound leukocytosis and lactic acidosis was more concerning for sepsis.  She was started on prednisone  on 12/25 but helping this alone was explaining her recent leukocytosis. So far she has been diagnosed with flu A which was likely the culprit for respiratory decline.  Urinary symptoms now suggest sepsis secondary to urinary source with persistent leukocytosis and CT scan above.  I would favor her decline in LV function to be related to stress-induced cardiomyopathy.  She has has had 48 hours of heparin   gtt. for NSTEMI although this is likely all demand ischemia.  She continues in persistent AF/RVR with presumed ongoing infection.  I do think proceeding with potential relief of any ureteral obstruction would make sense. Will hold off on starting significant GDMT right now while she is likely acutely infected.  Will plan to start on GDMT following stabilization of her creatinine and  completion of her urologic procedure.  For questions or updates, please contact [facility] Please consult [email or web address] for contact info under   Signed, Lauren DELENA Primus, MD  06/29/2024 3:32 PM     [1]  Allergies Allergen Reactions   Crestor  [Rosuvastatin  Calcium ] Other (See Comments)    Muscle aches     Hctz [Hydrochlorothiazide] Other (See Comments)    Pt doesn't remember   Lipitor [Atorvastatin] Other (See Comments)    Muscle aches    "

## 2024-06-29 NOTE — Consult Note (Signed)
 "   I have been asked to see the patient by Dr. Norval Bar, for evaluation and management of left hydronephrosis, abnormality in left upper pole of kidney and foreign body in bladder.  History of present illness: 68 year old female with significant comorbidities recently admitted and discharged with pyelonephritis.  She was readmitted on 06/27/2024 for cardiogenic/septic shock.  She has acute on chronic renal failure.  She has a abnormality in her left upper pole of the kidney, which was not present 1 month prior on her MRI, she also has a abnormality in her bladder that appears to be floating, it is concentric, and fairly normal-appearing.  Finally, she has some progressive left hydronephrosis all the way down to the bladder.  There is air in her bladder as well, but she did have a Foley catheter upon her most recent admission.  The patient is in respiratory failure and on BiPAP.  She has an increased white blood cell count.  She has progressive renal dysfunction.  She is also in heart failure.  Review of systems: A 12 point comprehensive review of systems was obtained and is negative unless otherwise stated in the history of present illness.  Patient Active Problem List   Diagnosis Date Noted   Acute on chronic respiratory failure with hypoxemia (HCC) 06/27/2024   Elevated troponin 06/27/2024   UTI (urinary tract infection) 06/27/2024   Acute HFrEF (heart failure with reduced ejection fraction) (HCC) 06/27/2024   Acute renal failure superimposed on stage 3a chronic kidney disease (HCC) 06/15/2024   Pyelonephritis of left kidney 06/15/2024   COPD with acute exacerbation (HCC) 06/15/2024   AKI (acute kidney injury) 01/13/2023   Hypothyroidism 01/12/2023   Acute on chronic diastolic CHF (congestive heart failure) (HCC) 01/11/2023   Chronic heart failure with preserved ejection fraction (HFpEF) (HCC) 01/11/2023   Atrial fibrillation, chronic (HCC) 11/18/2022   Intramural aortic hematoma  (HCC) 11/18/2022   Aortic dissection (HCC) 11/12/2022   Positive hepatitis C antibody test 09/02/2019   S/P left TKA 09/05/2017   S/P total knee replacement 09/05/2017   S/P right THA, AA 06/14/2016   Prediabetes 02/29/2016   Dyslipidemia 02/29/2016   Degenerative joint disease of right hip 02/29/2016   S/P laparoscopic sleeve gastrectomy 02/29/2016   Preoperative cardiovascular examination 02/19/2016   Hyperlipidemia 02/19/2016   Class 3 obesity (HCC) 02/19/2016   Essential hypertension 02/19/2016    Medications Ordered Prior to Encounter[1]  Past Medical History:  Diagnosis Date   Anemia    s/p knee surgery   Anxiety    Aortic dissection (HCC)    Arthritis    COPD (chronic obstructive pulmonary disease) (HCC) 06/15/2024   Depression    GERD (gastroesophageal reflux disease)    Glaucoma    right eye uses drops   History of blood transfusion    [facility]   History of MRSA infection    Hyperlipidemia    Hypertension    Hypothyroidism    Impaired vision in both eyes    blind left eye, limited vision right eye   Morbid obesity (HCC)    s/p Gastric sleeve- 8'17(loss thus far approx. 100 lbs)   Peripheral vascular disease    dvt right leg   Pre-diabetes    prior to gastric sleeve    Past Surgical History:  Procedure Laterality Date   ABDOMINAL HYSTERECTOMY  1981   with bilateral BSO   BREAST BIOPSY Right 10/18/2023   MM RT BREAST BX W LOC DEV 1ST  LESION IMAGE BX SPEC STEREO GUIDE 10/18/2023 [facility]   CATARACT EXTRACTION Right 2010   [facility]   COLONOSCOPY     DEBRIDEMENT AND CLOSURE WOUND Right 07/07/2014   Dr. Renato   EYE SURGERY     bil cataraction with IOL as a child, cataract surgery late 1990- right   JOINT REPLACEMENT Right 2016   knee   KNEE DEBRIDEMENT Right 06/22/2014   Dr. Reyes Boer with wound vac   KNEE SURGERY     i&d KNEE POST REPLACEMENT   LAPAROSCOPIC GASTRIC SLEEVE RESECTION N/A  02/29/2016   Procedure: LAPAROSCOPIC GASTRIC SLEEVE RESECTION, UPPER ENDO;  Surgeon: Camellia Blush, MD;  Location: [facility];  Service: General;  Laterality: N/A;   TONSILLECTOMY     TOTAL HIP ARTHROPLASTY Right 06/14/2016   Procedure: RIGHT TOTAL HIP ARTHROPLASTY ANTERIOR APPROACH;  Surgeon: Donnice Car, MD;  Location: [facility];  Service: Orthopedics;  Laterality: Right;   TOTAL KNEE ARTHROPLASTY Left 09/05/2017   Procedure: LEFT TOTAL KNEE ARTHROPLASTY;  Surgeon: Car Donnice, MD;  Location: [facility];  Service: Orthopedics;  Laterality: Left;  Adductor Block   UPPER GI ENDOSCOPY  01/25/2016    Social History[2]  Family History  Problem Relation Age of Onset   Depression Mother    Hypertension Father    Breast cancer Neg Hx     PE: Vitals:   06/29/24 0510 06/29/24 0547 06/29/24 0635 06/29/24 0816  BP: 104/72  111/77 101/66  Pulse: (!) 120  (!) 112 (!) 124  Resp: (!) 25 (!) 23 (!) 23 (!) 22  Temp: (!) 96.4 F (35.8 C)  97.9 F (36.6 C) 98 F (36.7 C)  TempSrc: Axillary  Oral Oral  SpO2: 97%  97% 96%  Weight:      Height:       Patient appears to be in no acute distress  patient is alert and oriented x3 Atraumatic normocephalic head No cervical or supraclavicular lymphadenopathy appreciated No increased work of breathing, no audible wheezes/rhonchi Regular sinus rhythm/rate Abdomen is soft, nontender, nondistended, no CVA or suprapubic tenderness Lower extremities are symmetric without appreciable edema Grossly neurologically intact No identifiable skin lesions  Recent Labs    06/28/24 0531 06/28/24 1932 06/29/24 0554  WBC 30.4* 31.2* 25.2*  HGB 10.5* 10.9* 11.0*  HCT 30.8* 33.2* 32.6*   Recent Labs    06/27/24 1514 06/28/24 0531 06/29/24 0554  NA 134* 137 139  K 4.7 4.1 3.7  CL 98 96* 95*  CO2 16* 17* 19*  GLUCOSE 229* 226* 248*  BUN 64* 69* 75*  CREATININE 3.63* 3.68* 3.40*  CALCIUM  8.6* 8.4* 8.4*   Recent Labs    06/26/24 2323  INR 2.4*   No results  for input(s): LABURIN in the last 72 hours. Results for orders placed or performed during the hospital encounter of 06/26/24  Resp panel by RT-PCR (RSV, Flu A&B, Covid) Anterior Nasal Swab     Status: Abnormal   Collection Time: 06/27/24 12:05 AM   Specimen: Anterior Nasal Swab  Result Value Ref Range Status   SARS Coronavirus 2 by RT PCR NEGATIVE NEGATIVE Final   Influenza A by PCR POSITIVE (A) NEGATIVE Final   Influenza B by PCR NEGATIVE NEGATIVE Final    Comment: (NOTE) The Xpert Xpress SARS-CoV-2/FLU/RSV plus assay is intended as an aid in the diagnosis of influenza from Nasopharyngeal swab specimens and should not be used as a sole basis for treatment. Nasal washings and aspirates are unacceptable for Xpert  Xpress SARS-CoV-2/FLU/RSV testing.  Fact Sheet for Patients: bloggercourse.com  Fact Sheet for Healthcare Providers: seriousbroker.it  This test is not yet approved or cleared by the [place]  FDA and has been authorized for detection and/or diagnosis of SARS-CoV-2 by FDA under an Emergency Use Authorization (EUA). This EUA will remain in effect (meaning this test can be used) for the duration of the COVID-19 declaration under Section 564(b)(1) of the Act, 21 U.S.C. section 360bbb-3(b)(1), unless the authorization is terminated or revoked.     Resp Syncytial Virus by PCR NEGATIVE NEGATIVE Final    Comment: (NOTE) Fact Sheet for Patients: bloggercourse.com  Fact Sheet for Healthcare Providers: seriousbroker.it  This test is not yet approved or cleared by the [place]  FDA and has been authorized for detection and/or diagnosis of SARS-CoV-2 by FDA under an Emergency Use Authorization (EUA). This EUA will remain in effect (meaning this test can be used) for the duration of the COVID-19 declaration under Section 564(b)(1) of the Act, 21 U.S.C. section  360bbb-3(b)(1), unless the authorization is terminated or revoked.  Performed at [facility], [address], [place], [place] [address]   Culture, blood (Routine X 2) w Reflex to ID Panel     Status: None (Preliminary result)   Collection Time: 06/27/24  6:57 AM   Specimen: BLOOD LEFT HAND  Result Value Ref Range Status   Specimen Description BLOOD LEFT HAND  Final   Special Requests   Final    BOTTLES DRAWN AEROBIC ONLY Blood Culture results may not be optimal due to an inadequate volume of blood received in culture bottles   Culture   Final    NO GROWTH 1 DAY Performed at [facility], [address], [place], [place] [address]    Report Status PENDING  Incomplete  Culture, blood (Routine X 2) w Reflex to ID Panel     Status: None (Preliminary result)   Collection Time: 06/27/24  7:45 AM   Specimen: BLOOD  Result Value Ref Range Status   Specimen Description BLOOD RIGHT ANTECUBITAL  Final   Special Requests   Final    BOTTLES DRAWN AEROBIC AND ANAEROBIC Blood Culture adequate volume   Culture   Final    NO GROWTH 1 DAY Performed at [facility], [address], [place], [place] [address]    Report Status PENDING  Incomplete  MRSA Next Gen by PCR, Nasal     Status: None   Collection Time: 06/27/24 11:38 AM   Specimen: Nasal Mucosa; Nasal Swab  Result Value Ref Range Status   MRSA by PCR Next Gen NOT DETECTED NOT DETECTED Final    Comment: (NOTE) The GeneXpert MRSA Assay (FDA approved for NASAL specimens only), is one component of a comprehensive MRSA colonization surveillance program. It is not intended to diagnose MRSA infection nor to guide or monitor treatment for MRSA infections. Test performance is not FDA approved in patients less than 11 years old. Performed at [facility], [address], [place], [place] [address]     Imaging: I reviewed the patient's CT scan on her current admission.  I also reviewed the images from the MRI which  was obtained in November 2025.  I also reviewed the CT scan from May 2024.  I discussed all of these with the radiologist.  The findings are as noted in the HPI.  Key findings from her imaging: Recent filling defect/hyperdense material in her left upper pole, not present on  her MRI from 1 month prior, making this far less likely to be a malignancy, and more likely to be infectious or sloughed papilla.  She has hydronephrosis down to the bladder as well as an abnormality within the bladder, that has been present now since the MRI at the very earliest, perhaps even present in 2024.  This does appear to be floating.  It is concentric.  It may have some calcification.  Imp: Comorbid female flu positive with multifocal pneumonia and acute on chronic heart failure with progressive worsening renal function.  She has an abnormality in the left upper pole of her kidney which is unclear, but could be infection versus sloughed papilla of the kidney.  She has progressive hydronephrosis down to the level of the bladder with no clear obstructive etiology.  She has an unusual finding within her bladder, that appears to be floating.  Recommendations: Given the above, I think the patient should have a left ureteral stent placed.  However, not sure of the overall acuity of this patient and whether putting her to sleep at this time is appropriate and without significant risk.  I will discuss this with her primary team as well as cardiology and critical care before making any definitive decision.  Please make her NPO after midnight if surgery is safe and I will take her tomorrow morning to the OR.  Morene LELON Salines        [1]  No current facility-administered medications on file prior to encounter.   Current Outpatient Medications on File Prior to Encounter  Medication Sig Dispense Refill   BELSOMRA  10 MG TABS Take 1 tablet by mouth at bedtime.     brimonidine -timolol  (COMBIGAN ) 0.2-0.5 % ophthalmic solution  Place 1 drop into the right eye 2 (two) times daily.     CALCIUM  CITRATE PO Take 600 mg by mouth daily.     Cyanocobalamin  (VITAMIN B12 PO) Take 1 tablet by mouth in the morning.     ELIQUIS  5 MG TABS tablet TAKE 1 TABLET(5 MG) BY MOUTH TWICE DAILY 180 tablet 1   empagliflozin (JARDIANCE) 10 MG TABS tablet Take 10 mg by mouth daily.     FLUoxetine  (PROZAC ) 40 MG capsule Take 40 mg by mouth daily.     furosemide  (LASIX ) 40 MG tablet Take 1 tablet (40 mg total) by mouth daily. 90 tablet 3   latanoprost  (XALATAN ) 0.005 % ophthalmic solution Place 1 drop into the right eye at bedtime.      levothyroxine  (SYNTHROID ) 112 MCG tablet Take 112 mcg by mouth every morning.     magnesium  oxide (MAG-OX) 400 (240 Mg) MG tablet Take 400 mg by mouth daily.     NON FORMULARY Take 1 tablet by mouth 2 (two) times daily. Cranberry conascorib acid     Omega 3-6-9 Fatty Acids (TRIPLE OMEGA-3-6-9 PO) Take 1 capsule by mouth daily.     rosuvastatin  (CRESTOR ) 40 MG tablet Take 40 mg by mouth in the morning.     Skin Protectants, Misc. (MINERIN CREME EX) Apply 1 application  topically daily.    [2]  Social History Tobacco Use   Smoking status: Never   Smokeless tobacco: Never  Vaping Use   Vaping status: Never Used  Substance Use Topics   Alcohol use: No   Drug use: No   "

## 2024-06-29 NOTE — Progress Notes (Signed)
 PHARMACY - ANTICOAGULATION CONSULT NOTE  Pharmacy Consult for IV heparin  Indication: chest pain/ACS  Allergies[1]  Patient Measurements: Height: 5' 5 (165.1 cm) Weight: 91.6 kg (202 lb) IBW/kg (Calculated) : 57 HEPARIN  DW (KG): 77.4  Vital Signs: Temp: 97.9 F (36.6 C) (12/27 0635) Temp Source: Oral (12/27 0635) BP: 111/77 (12/27 0635) Pulse Rate: 112 (12/27 0635)  Labs: Recent Labs    06/26/24 2323 06/26/24 2327 06/27/24 0002 06/27/24 0443 06/27/24 0705 06/27/24 1514 06/28/24 0200 06/28/24 0531 06/28/24 0900 06/28/24 1237 06/28/24 1932 06/28/24 2130 06/29/24 0554 06/29/24 0555  HGB 12.2   < >  --    < > 11.6*  --   --  10.5*  --   --  10.9*  --  11.0*  --   HCT 38.6   < >  --    < > 36.7  --   --  30.8*  --   --  33.2*  --  32.6*  --   PLT 313  --   --   --  241  --   --  260  --   --  310  --  304  --   APTT  --   --   --   --   --  48*   < >  --  122*  --   --  83* 78*  --   LABPROT 27.4*  --   --   --   --   --   --   --   --   --   --   --   --   --   INR 2.4*  --   --   --   --   --   --   --   --   --   --   --   --   --   HEPARINUNFRC  --   --   --   --   --   --   --   --   --  >1.10*  --  >1.10*  --  >1.10*  CREATININE 3.08*   < >  --   --  3.36* 3.63*  --  3.68*  --   --   --   --  3.40*  --   CKTOTAL  --   --  385*  --  894*  --   --   --   --   --   --   --   --   --    < > = values in this interval not displayed.    Estimated Creatinine Clearance: 17.5 mL/min (A) (by C-G formula based on SCr of 3.4 mg/dL (H)).   Assessment: Lauren Clark is a 68 y.o. year old female admitted on 06/26/2024 with concern for ACS. Eliquis  prior to admission for afib (LD 12/24 AM per patient). Pharmacy consulted to dose heparin . With presence of DOAC PTA will trend aPTT until correlates with anti-xa (heparin  level).  12/27 - aPTT is therapeutic (78 sec) on infusion at 900 units/hr. Heparin  level >1.1 (due to Eliquis ). CBC is stable (Hgb 11, plt 304). No bleeding  noted.  Goal of Therapy:  Heparin  level 0.3-0.7 units/ml aPTT 66-102 seconds Monitor platelets by anticoagulation protocol: Yes   Plan:  Continue heparin  at 900 units/hr.   Daily aPTT, heparin  level, CBC, and monitoring for bleeding  Thank you for allowing pharmacy to participate in this patient's care.  Lauren Clark,  PharmD PGY-1 Pharmacy Resident [name]  Please see amion for complete clinical pharmacist phone list  06/29/2024 7:07 AM     [1]  Allergies Allergen Reactions   Crestor  [Rosuvastatin  Calcium ] Other (See Comments)    Muscle aches     Hctz [Hydrochlorothiazide] Other (See Comments)    Pt doesn't remember   Lipitor [Atorvastatin] Other (See Comments)    Muscle aches

## 2024-06-29 NOTE — Plan of Care (Signed)

## 2024-06-29 NOTE — Progress Notes (Signed)
" °   06/29/24 0121  Assess: MEWS Score  Temp 97.6 F (36.4 C)  BP 99/71  MAP (mmHg) 81  Pulse Rate (!) 137  ECG Heart Rate (!) 130  Resp (!) 23  SpO2 93 %  O2 Device Nasal Cannula  O2 Flow Rate (L/min) 4 L/min  Assess: MEWS Score  MEWS Temp 0  MEWS Systolic 1  MEWS Pulse 3  MEWS RR 1  MEWS LOC 0  MEWS Score 5  MEWS Score Color Red  Assess: if the MEWS score is Yellow or Red  Were vital signs accurate and taken at a resting state? Yes  Does the patient meet 2 or more of the SIRS criteria? Yes  Does the patient have a confirmed or suspected source of infection? No  MEWS guidelines implemented  Yes, red  Treat  MEWS Interventions Considered administering scheduled or prn medications/treatments as ordered  Take Vital Signs  Increase Vital Sign Frequency  Red: Q1hr x2, continue Q4hrs until patient remains green for 12hrs  Escalate  MEWS: Escalate Red: Discuss with charge nurse and notify provider. Consider notifying RRT. If remains red for 2 hours consider need for higher level of care  Notify: Charge Nurse/RN  Name of Charge Nurse/RN Notified [facility]  Provider Notification  Provider Name/Title Dr. Otelia and Dr. Cleatus  Date Provider Notified 06/29/24  Time Provider Notified 0131  Method of Notification Page  Notification Reason New onset of dysrhythmia  Provider response See new orders  Date of Provider Response 06/29/24  Time of Provider Response 0135  Assess: SIRS CRITERIA  SIRS Temperature  0  SIRS Respirations  1  SIRS Pulse 1  SIRS WBC 1  SIRS Score Sum  3    "

## 2024-06-30 ENCOUNTER — Encounter (HOSPITAL_COMMUNITY): Admission: EM | Disposition: E | Payer: Self-pay | Source: Home / Self Care | Attending: Internal Medicine

## 2024-06-30 ENCOUNTER — Other Ambulatory Visit (HOSPITAL_COMMUNITY): Payer: Self-pay

## 2024-06-30 ENCOUNTER — Inpatient Hospital Stay (HOSPITAL_COMMUNITY): Admitting: Anesthesiology

## 2024-06-30 ENCOUNTER — Encounter (HOSPITAL_COMMUNITY): Payer: Self-pay | Admitting: Internal Medicine

## 2024-06-30 ENCOUNTER — Inpatient Hospital Stay (HOSPITAL_COMMUNITY)

## 2024-06-30 DIAGNOSIS — B952 Enterococcus as the cause of diseases classified elsewhere: Secondary | ICD-10-CM | POA: Diagnosis not present

## 2024-06-30 DIAGNOSIS — K7200 Acute and subacute hepatic failure without coma: Secondary | ICD-10-CM | POA: Diagnosis not present

## 2024-06-30 DIAGNOSIS — I5181 Takotsubo syndrome: Secondary | ICD-10-CM | POA: Diagnosis not present

## 2024-06-30 DIAGNOSIS — A419 Sepsis, unspecified organism: Secondary | ICD-10-CM | POA: Diagnosis not present

## 2024-06-30 DIAGNOSIS — J101 Influenza due to other identified influenza virus with other respiratory manifestations: Secondary | ICD-10-CM

## 2024-06-30 DIAGNOSIS — N12 Tubulo-interstitial nephritis, not specified as acute or chronic: Secondary | ICD-10-CM | POA: Diagnosis not present

## 2024-06-30 DIAGNOSIS — I5033 Acute on chronic diastolic (congestive) heart failure: Secondary | ICD-10-CM

## 2024-06-30 DIAGNOSIS — D689 Coagulation defect, unspecified: Secondary | ICD-10-CM | POA: Diagnosis not present

## 2024-06-30 DIAGNOSIS — Z66 Do not resuscitate: Secondary | ICD-10-CM | POA: Diagnosis not present

## 2024-06-30 DIAGNOSIS — I5043 Acute on chronic combined systolic (congestive) and diastolic (congestive) heart failure: Secondary | ICD-10-CM | POA: Diagnosis not present

## 2024-06-30 DIAGNOSIS — L89812 Pressure ulcer of head, stage 2: Secondary | ICD-10-CM | POA: Diagnosis not present

## 2024-06-30 DIAGNOSIS — I130 Hypertensive heart and chronic kidney disease with heart failure and stage 1 through stage 4 chronic kidney disease, or unspecified chronic kidney disease: Secondary | ICD-10-CM

## 2024-06-30 DIAGNOSIS — Z515 Encounter for palliative care: Secondary | ICD-10-CM | POA: Diagnosis not present

## 2024-06-30 DIAGNOSIS — J9621 Acute and chronic respiratory failure with hypoxia: Secondary | ICD-10-CM | POA: Diagnosis not present

## 2024-06-30 DIAGNOSIS — R579 Shock, unspecified: Secondary | ICD-10-CM | POA: Diagnosis not present

## 2024-06-30 DIAGNOSIS — J159 Unspecified bacterial pneumonia: Secondary | ICD-10-CM | POA: Diagnosis not present

## 2024-06-30 DIAGNOSIS — J1008 Influenza due to other identified influenza virus with other specified pneumonia: Secondary | ICD-10-CM | POA: Diagnosis not present

## 2024-06-30 DIAGNOSIS — N210 Calculus in bladder: Secondary | ICD-10-CM

## 2024-06-30 DIAGNOSIS — N1831 Chronic kidney disease, stage 3a: Secondary | ICD-10-CM

## 2024-06-30 DIAGNOSIS — R6521 Severe sepsis with septic shock: Secondary | ICD-10-CM | POA: Diagnosis not present

## 2024-06-30 DIAGNOSIS — G9341 Metabolic encephalopathy: Secondary | ICD-10-CM | POA: Diagnosis not present

## 2024-06-30 HISTORY — PX: CYSTOSCOPY WITH LITHOLAPAXY: SHX1425

## 2024-06-30 LAB — CBC WITH DIFFERENTIAL/PLATELET
Abs Immature Granulocytes: 0.19 K/uL — ABNORMAL HIGH (ref 0.00–0.07)
Abs Immature Granulocytes: 1.29 K/uL — ABNORMAL HIGH (ref 0.00–0.07)
Basophils Absolute: 0 K/uL (ref 0.0–0.1)
Basophils Absolute: 0.1 K/uL (ref 0.0–0.1)
Basophils Relative: 0 %
Basophils Relative: 0 %
Eosinophils Absolute: 0 K/uL (ref 0.0–0.5)
Eosinophils Absolute: 0 K/uL (ref 0.0–0.5)
Eosinophils Relative: 0 %
Eosinophils Relative: 0 %
HCT: 34.2 % — ABNORMAL LOW (ref 36.0–46.0)
HCT: 32.2 % — ABNORMAL LOW (ref 36.0–46.0)
Hemoglobin: 11 g/dL — ABNORMAL LOW (ref 12.0–15.0)
Hemoglobin: 10.2 g/dL — ABNORMAL LOW (ref 12.0–15.0)
Immature Granulocytes: 1 %
Immature Granulocytes: 3 %
Lymphocytes Relative: 5 %
Lymphocytes Relative: 1 %
Lymphs Abs: 0.9 K/uL (ref 0.7–4.0)
Lymphs Abs: 0.5 K/uL — ABNORMAL LOW (ref 0.7–4.0)
MCH: 29.5 pg (ref 26.0–34.0)
MCH: 29.9 pg (ref 26.0–34.0)
MCHC: 32.2 g/dL (ref 30.0–36.0)
MCHC: 31.7 g/dL (ref 30.0–36.0)
MCV: 91.7 fL (ref 80.0–100.0)
MCV: 94.4 fL (ref 80.0–100.0)
Monocytes Absolute: 0.8 K/uL (ref 0.1–1.0)
Monocytes Absolute: 1.3 K/uL — ABNORMAL HIGH (ref 0.1–1.0)
Monocytes Relative: 5 %
Monocytes Relative: 3 %
Neutro Abs: 14.9 K/uL — ABNORMAL HIGH (ref 1.7–7.7)
Neutro Abs: 35.9 K/uL — ABNORMAL HIGH (ref 1.7–7.7)
Neutrophils Relative %: 89 %
Neutrophils Relative %: 93 %
Platelets: 275 K/uL (ref 150–400)
Platelets: 235 K/uL (ref 150–400)
RBC: 3.73 MIL/uL — ABNORMAL LOW (ref 3.87–5.11)
RBC: 3.41 MIL/uL — ABNORMAL LOW (ref 3.87–5.11)
RDW: 15.9 % — ABNORMAL HIGH (ref 11.5–15.5)
RDW: 16 % — ABNORMAL HIGH (ref 11.5–15.5)
Smear Review: NORMAL
WBC: 16.8 K/uL — ABNORMAL HIGH (ref 4.0–10.5)
WBC: 39 K/uL — ABNORMAL HIGH (ref 4.0–10.5)
nRBC: 0.2 % (ref 0.0–0.2)
nRBC: 0.1 % (ref 0.0–0.2)

## 2024-06-30 LAB — LACTIC ACID, PLASMA
Lactic Acid, Venous: 2.2 mmol/L (ref 0.5–1.9)
Lactic Acid, Venous: 2.8 mmol/L (ref 0.5–1.9)

## 2024-06-30 LAB — HEPATIC FUNCTION PANEL
ALT: 1438 U/L — ABNORMAL HIGH (ref 0–44)
AST: 755 U/L — ABNORMAL HIGH (ref 15–41)
Albumin: 2.7 g/dL — ABNORMAL LOW (ref 3.5–5.0)
Alkaline Phosphatase: 208 U/L — ABNORMAL HIGH (ref 38–126)
Bilirubin, Direct: 0.5 mg/dL — ABNORMAL HIGH (ref 0.0–0.2)
Indirect Bilirubin: 0.4 mg/dL (ref 0.3–0.9)
Total Bilirubin: 0.9 mg/dL (ref 0.0–1.2)
Total Protein: 5.8 g/dL — ABNORMAL LOW (ref 6.5–8.1)

## 2024-06-30 LAB — BASIC METABOLIC PANEL WITH GFR
Anion gap: 18 — ABNORMAL HIGH (ref 5–15)
Anion gap: 22 — ABNORMAL HIGH (ref 5–15)
BUN: 79 mg/dL — ABNORMAL HIGH (ref 8–23)
BUN: 77 mg/dL — ABNORMAL HIGH (ref 8–23)
CO2: 26 mmol/L (ref 22–32)
CO2: 21 mmol/L — ABNORMAL LOW (ref 22–32)
Calcium: 9.1 mg/dL (ref 8.9–10.3)
Calcium: 8.9 mg/dL (ref 8.9–10.3)
Chloride: 98 mmol/L (ref 98–111)
Chloride: 95 mmol/L — ABNORMAL LOW (ref 98–111)
Creatinine, Ser: 2.89 mg/dL — ABNORMAL HIGH (ref 0.44–1.00)
Creatinine, Ser: 3.02 mg/dL — ABNORMAL HIGH (ref 0.44–1.00)
GFR, Estimated: 17 mL/min — ABNORMAL LOW
GFR, Estimated: 16 mL/min — ABNORMAL LOW
Glucose, Bld: 136 mg/dL — ABNORMAL HIGH (ref 70–99)
Glucose, Bld: 255 mg/dL — ABNORMAL HIGH (ref 70–99)
Potassium: 3.2 mmol/L — ABNORMAL LOW (ref 3.5–5.1)
Potassium: 3.7 mmol/L (ref 3.5–5.1)
Sodium: 142 mmol/L (ref 135–145)
Sodium: 139 mmol/L (ref 135–145)

## 2024-06-30 LAB — CBC
HCT: 31.5 % — ABNORMAL LOW (ref 36.0–46.0)
Hemoglobin: 10.1 g/dL — ABNORMAL LOW (ref 12.0–15.0)
MCH: 29.8 pg (ref 26.0–34.0)
MCHC: 32.1 g/dL (ref 30.0–36.0)
MCV: 92.9 fL (ref 80.0–100.0)
Platelets: 247 K/uL (ref 150–400)
RBC: 3.39 MIL/uL — ABNORMAL LOW (ref 3.87–5.11)
RDW: 16 % — ABNORMAL HIGH (ref 11.5–15.5)
WBC: 40.5 K/uL — ABNORMAL HIGH (ref 4.0–10.5)
nRBC: 0.1 % (ref 0.0–0.2)

## 2024-06-30 LAB — TROPONIN T, HIGH SENSITIVITY
Troponin T High Sensitivity: 77 ng/L — ABNORMAL HIGH (ref 0–19)
Troponin T High Sensitivity: 69 ng/L — ABNORMAL HIGH (ref 0–19)

## 2024-06-30 LAB — MAGNESIUM: Magnesium: 2 mg/dL (ref 1.7–2.4)

## 2024-06-30 MED ORDER — OXYCODONE HCL 5 MG PO TABS
2.5000 mg | ORAL_TABLET | Freq: Once | ORAL | Status: AC
  Administered 2024-06-30: 2.5 mg via ORAL
  Filled 2024-06-30: qty 1

## 2024-06-30 MED ORDER — PHENYLEPHRINE 80 MCG/ML (10ML) SYRINGE FOR IV PUSH (FOR BLOOD PRESSURE SUPPORT)
PREFILLED_SYRINGE | INTRAVENOUS | Status: AC
  Filled 2024-06-30: qty 10

## 2024-06-30 MED ORDER — MIDAZOLAM HCL (PF) 2 MG/2ML IJ SOLN
INTRAMUSCULAR | Status: DC | PRN
  Administered 2024-06-30: 2 mg via INTRAVENOUS

## 2024-06-30 MED ORDER — PROPOFOL 10 MG/ML IV BOLUS
INTRAVENOUS | Status: AC
  Filled 2024-06-30: qty 20

## 2024-06-30 MED ORDER — LIDOCAINE 2% (20 MG/ML) 5 ML SYRINGE
INTRAMUSCULAR | Status: AC
  Filled 2024-06-30: qty 5

## 2024-06-30 MED ORDER — SODIUM BICARBONATE 650 MG PO TABS
325.0000 mg | ORAL_TABLET | Freq: Two times a day (BID) | ORAL | Status: AC
  Administered 2024-06-30 – 2024-07-08 (×17): 325 mg via ORAL
  Filled 2024-06-30 (×4): qty 1

## 2024-06-30 MED ORDER — LIDOCAINE 2% (20 MG/ML) 5 ML SYRINGE
INTRAMUSCULAR | Status: DC | PRN
  Administered 2024-06-30: 60 mg via INTRAVENOUS

## 2024-06-30 MED ORDER — SODIUM CHLORIDE 0.9 % IR SOLN
Status: DC | PRN
  Administered 2024-06-30: 18000 mL via INTRAVESICAL

## 2024-06-30 MED ORDER — ROCURONIUM BROMIDE 10 MG/ML (PF) SYRINGE
PREFILLED_SYRINGE | INTRAVENOUS | Status: AC
  Filled 2024-06-30: qty 10

## 2024-06-30 MED ORDER — ONDANSETRON HCL 4 MG/2ML IJ SOLN
INTRAMUSCULAR | Status: AC
  Filled 2024-06-30: qty 2

## 2024-06-30 MED ORDER — PROPOFOL 10 MG/ML IV BOLUS
INTRAVENOUS | Status: DC | PRN
  Administered 2024-06-30: 70 mg via INTRAVENOUS

## 2024-06-30 MED ORDER — LACTATED RINGERS IV BOLUS
500.0000 mL | Freq: Once | INTRAVENOUS | Status: AC
  Administered 2024-06-30: 500 mL via INTRAVENOUS

## 2024-06-30 MED ORDER — SUGAMMADEX SODIUM 200 MG/2ML IV SOLN
INTRAVENOUS | Status: AC
  Filled 2024-06-30: qty 2

## 2024-06-30 MED ORDER — SODIUM CHLORIDE 0.9 % IV SOLN
100.0000 mg | INTRAVENOUS | Status: DC
  Administered 2024-06-30 – 2024-07-03 (×4): 100 mg via INTRAVENOUS
  Filled 2024-06-30 (×2): qty 5

## 2024-06-30 MED ORDER — SODIUM CHLORIDE 0.9 % IV SOLN: INTRAVENOUS | Status: DC | PRN

## 2024-06-30 MED ORDER — ORAL CARE MOUTH RINSE: 15.0000 mL | Freq: Once | OROMUCOSAL | Status: AC

## 2024-06-30 MED ORDER — FENTANYL CITRATE (PF) 100 MCG/2ML IJ SOLN
INTRAMUSCULAR | Status: AC
  Filled 2024-06-30: qty 2

## 2024-06-30 MED ORDER — POTASSIUM CHLORIDE 10 MEQ/100ML IV SOLN
10.0000 meq | INTRAVENOUS | Status: AC
  Administered 2024-06-30 (×5): 10 meq via INTRAVENOUS
  Filled 2024-06-30 (×5): qty 100

## 2024-06-30 MED ORDER — DEXAMETHASONE SOD PHOSPHATE PF 10 MG/ML IJ SOLN
INTRAMUSCULAR | Status: DC | PRN
  Administered 2024-06-30: 5 mg via INTRAVENOUS

## 2024-06-30 MED ORDER — ALBUMIN HUMAN 5 % IV SOLN: INTRAVENOUS | Status: DC | PRN

## 2024-06-30 MED ORDER — IOHEXOL 300 MG/ML  SOLN
INTRAMUSCULAR | Status: DC | PRN
  Administered 2024-06-30: 26 mL via URETHRAL

## 2024-06-30 MED ORDER — CHLORHEXIDINE GLUCONATE 0.12 % MT SOLN
15.0000 mL | Freq: Once | OROMUCOSAL | Status: AC
  Administered 2024-06-30: 15 mL via OROMUCOSAL

## 2024-06-30 MED ORDER — WATER FOR IRRIGATION, STERILE IR SOLN
Status: DC | PRN
  Administered 2024-06-30: 1000 mL

## 2024-06-30 MED ORDER — FENTANYL CITRATE (PF) 100 MCG/2ML IJ SOLN: 25.0000 ug | INTRAMUSCULAR | Status: DC | PRN

## 2024-06-30 MED ORDER — ONDANSETRON HCL 4 MG/2ML IJ SOLN
INTRAMUSCULAR | Status: DC | PRN
  Administered 2024-06-30: 4 mg via INTRAVENOUS

## 2024-06-30 MED ORDER — MIDAZOLAM HCL 2 MG/2ML IJ SOLN
INTRAMUSCULAR | Status: AC
  Filled 2024-06-30: qty 2

## 2024-06-30 MED ORDER — VASOPRESSIN 20 UNIT/ML IV SOLN
INTRAVENOUS | Status: AC
  Filled 2024-06-30: qty 1

## 2024-06-30 MED ORDER — LIDOCAINE 5 % EX PTCH
1.0000 | MEDICATED_PATCH | Freq: Every day | CUTANEOUS | Status: AC
  Administered 2024-06-30 – 2024-07-02 (×3): 1 via TRANSDERMAL
  Filled 2024-06-30: qty 1

## 2024-06-30 MED ORDER — LACTATED RINGERS IV SOLN: INTRAVENOUS | Status: DC

## 2024-06-30 MED ORDER — CHLORHEXIDINE GLUCONATE 0.12 % MT SOLN
OROMUCOSAL | Status: AC
  Filled 2024-06-30: qty 15

## 2024-06-30 MED ORDER — AMISULPRIDE (ANTIEMETIC) 5 MG/2ML IV SOLN: 10.0000 mg | Freq: Once | INTRAVENOUS | Status: DC | PRN

## 2024-06-30 MED ORDER — VASOPRESSIN 20 UNIT/ML IV SOLN
INTRAVENOUS | Status: DC | PRN
  Administered 2024-06-30 (×5): 2 [IU] via INTRAVENOUS
  Administered 2024-06-30: 1 [IU] via INTRAVENOUS
  Administered 2024-06-30: 2 [IU] via INTRAVENOUS
  Administered 2024-06-30: 1 [IU] via INTRAVENOUS

## 2024-06-30 MED ORDER — LIDOCAINE HCL URETHRAL/MUCOSAL 2 % EX GEL
CUTANEOUS | Status: AC
  Filled 2024-06-30: qty 11

## 2024-06-30 MED ORDER — LACTATED RINGERS IV BOLUS: 500.0000 mL | Freq: Once | INTRAVENOUS | Status: DC

## 2024-06-30 MED ORDER — DILTIAZEM HCL-DEXTROSE 125-5 MG/125ML-% IV SOLN (PREMIX)
5.0000 mg/h | INTRAVENOUS | Status: DC
  Administered 2024-06-30: 2.5 mg/h via INTRAVENOUS
  Filled 2024-06-30: qty 125

## 2024-06-30 NOTE — Consult Note (Addendum)
 "        [facility]    Date of Admission:  06/26/2024     Reason for Consult: ?fungal ball in kidney/bladder    Referring Provider: Cosette     Lines:  12/24-c urethral catheter 12/24-c left ureteral stent 12/24-c picc double lumen  Abx: 12/28-c micafungin  12/25-c piptazo 12/25-c oseltamivir        12/25-27 vanc  Assessment: 68 yo female with copd on home o2, dm2 on jardiance, recent pyelo, readmitted with influenza/copd exacerbation and shock ?septic/cardiogenic (tte 30% ef), with ct imaging w/u for aki/transaminitis showing new left renal and bladder mass, concerning for fungal ball   Patient underwent on 12/28 cystoscopy and ureteroscopy with left ureteral stent placement and retrieval of the left renal mass and bladder mass. The bladder mass appear mostly necrotic and the left renal mass as well with some viable tissue  Frozen section (spoke with pathologist dr Macie Hummingbird) who reports no viable tissue on several cut of frozen section and she sees scattered cocci in the mass. The bacteria likely bystanders and unsual would expect gnr   Patient doesn't have risk factors for fungal infection outside of candida (she is on jardiance; but didn't have dka). Mucor/aspergillus would be extremely rare  Ucx e feacalis appear to be what is seen in the bladder mass frozen section. Not entirely clear if superimposed bacterial infection at this time but will keep on abx for now   Given her severe presentation although the shock could be cardiogenic from possible influenza related myocarditis vs sepsis in general, can't r/o septic process from renal yet, and will cover for gu infection    Epidemiologically speaking, micafungin  would penetrate the kidney but not bladder (however bladder mass removed), and would use this for presumed candida pyelonephritis. Would also continue empiric piptazo for now.   Bcx on admission has been negative, but if candida invasive  disease / bsi could grow in around 5 days   Also has lower back pain that is rather severe. Will see if mri suggestive of any pyogenic process   Influenza/copd exacerbation seems to be improving with supportive care from primary team. Chest ct did suggest a viral pneumonitis vs some fluid component from cardiogenic shock  Shock associated lft elevation also improving  Micro: 12/28 renal/bladder  Bacterial cx - in process [employer] cx - in process Fungal cx - in process   Plan: Continue piptazo Finish oseltamivir  course per primary team Start micafungin  100 mg daily F/u pathology and fungal cx from renal/bladder tissue I would like to rescan her abd pelv in another 3-5 days -- if new masses found will need urology help with debridement and will consider empiric amphotericin at that point I don't see benefit of serologic fungal biomarkers testing at this time  She has severe lower back pain and will get mri in the case if candida infection that has spread systemically to involve the back will need longer treatment Maintain droplet precaution Appreciate urology and triad care for patient      ------------------------------------------------ Principal Problem:   Acute on chronic respiratory failure with hypoxemia (HCC) Active Problems:   AKI (acute kidney injury)   COPD with acute exacerbation (HCC)   Elevated troponin   UTI (urinary tract infection)   Acute HFrEF (heart failure with reduced ejection fraction) (HCC)   HFrEF (heart failure with reduced ejection fraction) (HCC)   Pressure injury of skin    HPI: Lauren Clark is a 68 y.o.  female COPD, chronic hypoxemic respiratory failure on 2 L home oxygen, CAD, A-fib on Eliquis , HFpEF, ascending aortic aneurysm followed by cardiothoracic surgery, hypertension, hyperlipidemia, hypothyroidism, CKD stage IIIa, anemia, anxiety, depression, GERD, glaucoma dm2 on jardiance, recent hospital admission 12/13-12/24 for AKI, pyelonephritis,  ileus, mildly elevated LFTs, CHF exacerbation, readmitted same day 12/24 after a fall and continued to have dyspnea, found to be in shock, along with incidental abnormal left kidney/bladder mass on imaging concerning for fungal ball  Biba  Home o2 2 liters satting 80s Lft extremely elevated Echo 30% ef Wheezing and swab showed influenza a  Given lft and shock, ct abd pelv done and showed left renal/bladder mass  Urology did endoscopy today and was concern this could look like fungal ball.   I spoke with path afterward and frozen section was almost entirely necrotic with superimposed cocci seen. No obvious fungal element  She was taking jardiance up to the point of admission  No dka present at admission  Feeling well at this time on the ward  Does complain of hx back pain and worse now 8-9/10  Has left knee and right hip arthroplasty no swelling. Chronic left knee area pain  Ucx e faecalis Bcx negative on admission On bsAbx along with oseltamivir  and copd exacerbation tx with steroid  No other complaint     Family History  Problem Relation Age of Onset   Depression Mother    Hypertension Father    Breast cancer Neg Hx     Social History[1]  Allergies[2]  Review of Systems: ROS All Other ROS was negative, except mentioned above   Past Medical History:  Diagnosis Date   Anemia    s/p knee surgery   Anxiety    Aortic dissection (HCC)    Arthritis    COPD (chronic obstructive pulmonary disease) (HCC) 06/15/2024   Depression    GERD (gastroesophageal reflux disease)    Glaucoma    right eye uses drops   History of blood transfusion    [facility]   History of MRSA infection    Hyperlipidemia    Hypertension    Hypothyroidism    Impaired vision in both eyes    blind left eye, limited vision right eye   Morbid obesity (HCC)    s/p Gastric sleeve- 8'17(loss thus far approx. 100 lbs)   Peripheral vascular disease    dvt right  leg   Pre-diabetes    prior to gastric sleeve       Scheduled Meds:  brimonidine   1 drop Right Eye BID   And   timolol   1 drop Right Eye BID   Chlorhexidine  Gluconate Cloth  6 each Topical Daily   diclofenac  Sodium  2 g Topical TID AC & HS   FLUoxetine   40 mg Oral Daily   guaiFENesin   600 mg Oral BID   latanoprost   1 drop Right Eye QHS   levothyroxine   112 mcg Oral q morning   oseltamivir   30 mg Oral Daily   predniSONE   40 mg Oral Q breakfast   sodium bicarbonate   325 mg Oral BID   sodium chloride  flush  10-40 mL Intracatheter Q12H   Continuous Infusions:  amiodarone  30 mg/hr (06/30/24 1249)   piperacillin -tazobactam (ZOSYN )  IV 2.25 g (06/30/24 0241)   potassium chloride  10 mEq (06/30/24 1246)   PRN Meds:.albuterol , calcium  carbonate, prochlorperazine , sodium chloride  flush   OBJECTIVE: Blood pressure (!) 81/54, pulse (!) 122, temperature 97.8 F (36.6 C),  temperature source Axillary, resp. rate (!) 39, height 5' 5 (1.651 m), weight 91.6 kg, SpO2 97%.  Physical Exam General/constitutional: no distress, pleasant HEENT: Normocephalic, PER, Conj Clear; strabismus Neck supple CV: rrr no mrg Lungs: clear to auscultation, normal respiratory effort Abd: Soft, Nontender Ext: no edema Skin: No Rash Neuro: nonfocal MSK: left knee tenderness stable/chronic; no swelling/redness/warmth    Lab Results Lab Results  Component Value Date   WBC 16.8 (H) 06/30/2024   HGB 11.0 (L) 06/30/2024   HCT 34.2 (L) 06/30/2024   MCV 91.7 06/30/2024   PLT 275 06/30/2024    Lab Results  Component Value Date   CREATININE 2.89 (H) 06/30/2024   BUN 79 (H) 06/30/2024   NA 142 06/30/2024   K 3.2 (L) 06/30/2024   CL 98 06/30/2024   CO2 26 06/30/2024    Lab Results  Component Value Date   ALT 1,438 (H) 06/30/2024   AST 755 (H) 06/30/2024   ALKPHOS 208 (H) 06/30/2024   BILITOT 0.9 06/30/2024      Microbiology: Recent Results (from the past 240 hours)  Resp panel by RT-PCR  (RSV, Flu A&B, Covid) Anterior Nasal Swab     Status: Abnormal   Collection Time: 06/27/24 12:05 AM   Specimen: Anterior Nasal Swab  Result Value Ref Range Status   SARS Coronavirus 2 by RT PCR NEGATIVE NEGATIVE Final   Influenza A by PCR POSITIVE (A) NEGATIVE Final   Influenza B by PCR NEGATIVE NEGATIVE Final    Comment: (NOTE) The Xpert Xpress SARS-CoV-2/FLU/RSV plus assay is intended as an aid in the diagnosis of influenza from Nasopharyngeal swab specimens and should not be used as a sole basis for treatment. Nasal washings and aspirates are unacceptable for Xpert Xpress SARS-CoV-2/FLU/RSV testing.  Fact Sheet for Patients: bloggercourse.com  Fact Sheet for Healthcare Providers: seriousbroker.it  This test is not yet approved or cleared by the [place]  FDA and has been authorized for detection and/or diagnosis of SARS-CoV-2 by FDA under an Emergency Use Authorization (EUA). This EUA will remain in effect (meaning this test can be used) for the duration of the COVID-19 declaration under Section 564(b)(1) of the Act, 21 U.S.C. section 360bbb-3(b)(1), unless the authorization is terminated or revoked.     Resp Syncytial Virus by PCR NEGATIVE NEGATIVE Final    Comment: (NOTE) Fact Sheet for Patients: bloggercourse.com  Fact Sheet for Healthcare Providers: seriousbroker.it  This test is not yet approved or cleared by the [place]  FDA and has been authorized for detection and/or diagnosis of SARS-CoV-2 by FDA under an Emergency Use Authorization (EUA). This EUA will remain in effect (meaning this test can be used) for the duration of the COVID-19 declaration under Section 564(b)(1) of the Act, 21 U.S.C. section 360bbb-3(b)(1), unless the authorization is terminated or revoked.  Performed at [facility], [address] [place], [place] [address]   Urine  Culture     Status: Abnormal (Preliminary result)   Collection Time: 06/27/24  6:20 AM   Specimen: Urine, Clean Catch  Result Value Ref Range Status   Specimen Description URINE, CLEAN CATCH  Final   Special Requests NONE  Final   Culture (A)  Final    >=100,000 COLONIES/mL ENTEROCOCCUS FAECALIS SUSCEPTIBILITIES TO FOLLOW Performed at [facility], [address], [place], [place] [address]    Report Status PENDING  Incomplete  Culture, blood (Routine X 2) w Reflex to ID Panel     Status: None (Preliminary result)  Collection Time: 06/27/24  6:57 AM   Specimen: BLOOD LEFT HAND  Result Value Ref Range Status   Specimen Description BLOOD LEFT HAND  Final   Special Requests   Final    BOTTLES DRAWN AEROBIC ONLY Blood Culture results may not be optimal due to an inadequate volume of blood received in culture bottles   Culture   Final    NO GROWTH 3 DAYS Performed at [facility], [address] [place], [place] [address]    Report Status PENDING  Incomplete  Culture, blood (Routine X 2) w Reflex to ID Panel     Status: None (Preliminary result)   Collection Time: 06/27/24  7:45 AM   Specimen: BLOOD  Result Value Ref Range Status   Specimen Description BLOOD RIGHT ANTECUBITAL  Final   Special Requests   Final    BOTTLES DRAWN AEROBIC AND ANAEROBIC Blood Culture adequate volume   Culture   Final    NO GROWTH 3 DAYS Performed at [facility], [address], [place], [place] [address]    Report Status PENDING  Incomplete  MRSA Next Gen by PCR, Nasal     Status: None   Collection Time: 06/27/24 11:38 AM   Specimen: Nasal Mucosa; Nasal Swab  Result Value Ref Range Status   MRSA by PCR Next Gen NOT DETECTED NOT DETECTED Final    Comment: (NOTE) The GeneXpert MRSA Assay (FDA approved for NASAL specimens only), is one component of a comprehensive MRSA colonization surveillance program. It is not intended to diagnose MRSA infection nor to guide or monitor treatment  for MRSA infections. Test performance is not FDA approved in patients less than 64 years old. Performed at [facility], [address], [place], [place] [address]      Serology:    Imaging: If present, new imagings (plain films, ct scans, and mri) have been personally visualized and interpreted; radiology reports have been reviewed. Decision making incorporated into the Impression / Recommendations.  12/24 ct head and cspine 1. No acute intracranial abnormality. 2. No acute fracture or traumatic malalignment of the cervical spine. 3. Unchanged small putative left parfalcine meningioma.   12/25 limited ruq us  1. Gallstones without sonographic findings of acute cholecystitis. 2. Increased hepatic parenchymal echogenicity typical of steatosis. 3. Previous echogenic right lobe lesion is not seen on the current exam.   12/25 ct chest 1. Interval development of patchy ground-glass opacity with interlobular septal thickening in both upper lobes, left greater than right. Confluent areas of consolidative airspace disease are seen in a patchy distribution of the mid and lower lungs bilaterally. Imaging features are compatible with multifocal pneumonia. 2. Trace left pleural effusion.   12/26 ct abd pelv 1. Mild left hydroureteronephrosis with no obstructing calcified stone evident. Given lack of identifiable stone, mucosal lesion in the UVJ must be excluded. 2. Upper pole calyx in the left kidney is filled with high density material, potentially blood products or infectious debris. Urothelial neoplasm could have this appearance. Urology consultation recommended. 3. 4.9 x 3.9 x 3.0 cm lamellated structure in the non dependent bladder lumen, apparently representing a lamellated stone with internal gas. The gas within the stone is apparently creating buoyancy. 4. Patchy airspace disease in lung bases is associated with small left pleural effusion. Imaging features could  be related to atelectasis or pneumonia. 5. Cholelithiasis. 6.  Aortic Atherosclerosis   12/26 lumbar and thoracic spine Djd   --------- Previous admission 12/13 mri entero  abdomen wwo contrast 1. Limited examination due to breathing motion artifact. 2. No significant hepatic findings. No findings suspicious for significant fatty infiltration and no worrisome hepatic lesions or biliary dilatation. 3. Cholelithiasis but no MR findings for acute cholecystitis. 4. Severe atrophy of the right kidney. 5. No acute abdominal/pelvic findings, mass lesions or adenopathy.  12/13 renal u/s 1. Chronic partially atrophied right kidney. No acute findings.    Lauren ONEIDA Passer, MD [facility] [phone] pager    06/30/2024, 2:44 PM     [1]  Social History Tobacco Use   Smoking status: Never   Smokeless tobacco: Never  Vaping Use   Vaping status: Never Used  Substance Use Topics   Alcohol use: No   Drug use: No  [2]  Allergies Allergen Reactions   Crestor  [Rosuvastatin  Calcium ] Other (See Comments)    Muscle aches     Hctz [Hydrochlorothiazide] Other (See Comments)    Pt doesn't remember   Lipitor [Atorvastatin] Other (See Comments)    Muscle aches    "

## 2024-06-30 NOTE — Op Note (Signed)
 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  The ureter was of normal caliber and was otherwise normal.  There was a filling defect in the upper pole as well as in the lower pole in the posterior calyx. #2: Within the upper and lower pole the patient had a large amorphus mass that was white, mostly necrotic, which I was able to laser and break up, but unable to fully remove. #3: Within the patient's bladder there was a 7 cm stone.  This had a very thin outer shell which was easily fragmented.  Underneath the outer shell there was mostly necrotic white tissue.  There was some rubbery tissue as well. #3: A 24 cm x 7 French Polaris stent was placed in the patient's left kidney.  EBL: Minimal  Specimens:  #1: Presumed fungal ball from the bladder sent to the surgical path #2: This was also sent for frozen section #3: Aspirate from the left renal pelvis was sent for aerobic and anaerobic culture, AFP, and fungal culture  Indication: Lauren Clark is a 68 y.o. patient with severe comorbidities including CHF, acute on chronic renal failure, recurrent UTIs who on CT scan was found to have some hydronephrosis of the left kidney, an abnormal finding in the left upper pole as well as within the bladder.  After reviewing the management options for  treatment, he elected to proceed with the above surgical procedure(s). We have discussed the potential benefits and risks of the procedure, side effects of the proposed treatment, the likelihood of the patient achieving the goals of the procedure, and any potential problems that might occur during the procedure or recuperation. Informed consent has been obtained.  Description of procedure:  Consent was obtained in the preoperative holding area.  She was brought back to the op room and placed on table in supine position.  General esthesia was then induced endotracheal tube was inserted.  She was placed in dorsolithotomy position and prepped and draped in the routine sterile fashion.  A time was subsequently performed.  A 21 French 3 degree cystoscope was then gently passed through the patient's urethra and into the bladder under visual guidance.  A large bladder stone was noted within the bladder, the ureters were orthotopic, the remainder of the bladder was remarkably normal otherwise.  I then performed retrograde pyelogram of the left kidney using a 5 French open-ended ureteral catheter and 10 cc of Omnipaque  contrast with the above findings.  I subsequently advanced the open-ended up into the renal pelvis and using the wire was able to guide into the upper pole where the filling defect was.  I then attempted to aspirate this calyx which I was unable to do.  I tried irrigating the calyx and then aspirating unsuccessfully.  I then removed the open-ended catheter leaving a wire behind.  I subsequently advanced a single-lumen flexible ureteroscope over the wire and into the left renal pelvis.  I remove the wire and  performed pyeloscopy with the above findings.  I used a 200 m laser fiber and attempted to break up the amorphous mass in the upper and lower pole.  I was successful in disrupting and breaking it up, but it was mostly necrotic tissue and was very hard to basket.  I did collect a washing from these  calyces which I sent for microbiology.  I also used an encompass basket and tried to remove as much of the tissue as possible, although there was a lot of there mass/abnormal tissue remaining.  I slowly backed out the ureteroscope noting no significant ureteral trauma.  I then repassed the 21 French 30 degree cystoscope into the bladder and repassed the wire into the left renal pelvis.  I removed the scope over the wire leaving the wire behind and snapped it to the drapes.  At this point I turned my attention to the bladder stone.  I used the laser to fragment the stone into very small pieces.  I then used the rigid forceps and was able to remove the tissue in chunks.  Once all of the tissue was fully removed I reinspected the bladder noting no residual tissue or other abnormalities.  I then passed a 7 French x 24 cm Polaris stent over the wire and advanced to the urethral meatus.  Prior to removing the wire I used fluoroscopy to confirm that the proximal end was located in the upper pole.  I remove the wire leaving the stent behind.  I then repassed the cystoscope to ensure that the end of the stent was well within the bladder.  I passed a 12 French Foley catheter, and patient was subsequently awoken, extubated, and returned to the PACU in stable condition.  Disposition: Awaiting the final results of the culture.  Have consulted infectious disease.  Will follow along, and potentially take the patient back for second look in the near future to try and remove the remainder of the tissue in the left upper pole.

## 2024-06-30 NOTE — Progress Notes (Signed)
 " Cardiology Progress Note:   Patient ID: Lauren Clark MRN: [id number]; DOB: Jun 27, 1956  Admit date: 06/26/2024 Date of Consult: 06/30/2024  Primary Care Provider: [name], MD [facility] Cardiologist: Vinie JAYSON Maxcy, MD  [facility] Electrophysiologist:  None   Patient Profile:   Lauren Clark is a 68 y.o. female with HFpEF, CKD 3B, paroxysmal AF, morbid obesity s/p gastric sleeve, prior DVT, HTN, HLD, TAA, hypothyroidism, nonobstructive CAD who presents with ADHF in the setting of flu A+ and newly reduced LVEF (55-60%->30-35%) consistent with possible stress-induced cardiomyopathy.  Interval Events:   Persistent AF/RVR 110-120s and fortunately asymptomatic. She went for cystoscopy today and was found to have a mass in the left upper and lower pole of her kidney that was mostly necrotic tissue along with a mass in the bladder which was mostly necrotic and removed and sent for pathology. Concern for fungal ball within the bladder.  Past Medical History:  Diagnosis Date   Anemia    s/p knee surgery   Anxiety    Aortic dissection (HCC)    Arthritis    COPD (chronic obstructive pulmonary disease) (HCC) 06/15/2024   Depression    GERD (gastroesophageal reflux disease)    Glaucoma    right eye uses drops   History of blood transfusion    [facility]   History of MRSA infection    Hyperlipidemia    Hypertension    Hypothyroidism    Impaired vision in both eyes    blind left eye, limited vision right eye   Morbid obesity (HCC)    s/p Gastric sleeve- 8'17(loss thus far approx. 100 lbs)   Peripheral vascular disease    dvt right leg   Pre-diabetes    prior to gastric sleeve   Past Surgical History:  Procedure Laterality Date   ABDOMINAL HYSTERECTOMY  1981   with bilateral BSO   BREAST BIOPSY Right 10/18/2023   MM RT BREAST BX W LOC DEV 1ST LESION IMAGE BX SPEC STEREO GUIDE 10/18/2023 [facility]   CATARACT EXTRACTION Right  2010   [facility]   COLONOSCOPY     DEBRIDEMENT AND CLOSURE WOUND Right 07/07/2014   Dr. Renato   EYE SURGERY     bil cataraction with IOL as a child, cataract surgery late 1990- right   JOINT REPLACEMENT Right 2016   knee   KNEE DEBRIDEMENT Right 06/22/2014   Dr. Reyes Boer with wound vac   KNEE SURGERY     i&d KNEE POST REPLACEMENT   LAPAROSCOPIC GASTRIC SLEEVE RESECTION N/A 02/29/2016   Procedure: LAPAROSCOPIC GASTRIC SLEEVE RESECTION, UPPER ENDO;  Surgeon: Camellia Blush, MD;  Location: [facility];  Service: General;  Laterality: N/A;   TONSILLECTOMY     TOTAL HIP ARTHROPLASTY Right 06/14/2016   Procedure: RIGHT TOTAL HIP ARTHROPLASTY ANTERIOR APPROACH;  Surgeon: Donnice Car, MD;  Location: [facility];  Service: Orthopedics;  Laterality: Right;   TOTAL KNEE ARTHROPLASTY Left 09/05/2017   Procedure: LEFT TOTAL KNEE ARTHROPLASTY;  Surgeon: Car Donnice, MD;  Location: [facility];  Service: Orthopedics;  Laterality: Left;  Adductor Block   UPPER GI ENDOSCOPY  01/25/2016    Inpatient Medications: Scheduled Meds:  brimonidine   1 drop Right Eye BID   And   timolol   1 drop Right Eye BID   Chlorhexidine  Gluconate Cloth  6 each Topical Daily   diclofenac  Sodium  2 g Topical TID AC & HS   FLUoxetine   40 mg  Oral Daily   guaiFENesin   600 mg Oral BID   latanoprost   1 drop Right Eye QHS   levothyroxine   112 mcg Oral q morning   lidocaine   1 patch Transdermal QHS   oseltamivir   30 mg Oral Daily   predniSONE   40 mg Oral Q breakfast   sodium bicarbonate   325 mg Oral BID   sodium chloride  flush  10-40 mL Intracatheter Q12H   Continuous Infusions:  amiodarone  30 mg/hr (06/30/24 1249)   micafungin  (MYCAMINE ) 100 mg in sodium chloride  0.9 % 100 mL IVPB     piperacillin -tazobactam (ZOSYN )  IV 2.25 g (06/30/24 1550)   potassium chloride  10 mEq (06/30/24 1753)   PRN Meds: albuterol , calcium  carbonate, prochlorperazine , sodium chloride  flush  Allergies:   Allergies[1]  Social History:    Social History   Socioeconomic History   Marital status: Widowed    Spouse name: Not on file   Number of children: Not on file   Years of education: 0   Highest education level: Not on file  Occupational History   Occupation: former public relations account executive    Comment: [employer]  Tobacco Use   Smoking status: Never   Smokeless tobacco: Never  Vaping Use   Vaping status: Never Used  Substance and Sexual Activity   Alcohol use: No   Drug use: No   Sexual activity: Not Currently    Birth control/protection: Surgical  Other Topics Concern   Not on file  Social History Narrative   epworth sleepiness scale = 2 (02/19/16)   Social Drivers of Health   Tobacco Use: Low Risk (06/30/2024)   Patient History    Smoking Tobacco Use: Never    Smokeless Tobacco Use: Never    Passive Exposure: Not on file  Financial Resource Strain: Not on file  Food Insecurity: Food Insecurity Present (06/27/2024)   Epic    Worried About Programme Researcher, Broadcasting/film/video in the Last Year: Sometimes true    [employer] of Food in the Last Year: Sometimes true  Transportation Needs: Unmet Transportation Needs (06/27/2024)   Epic    Lack of Transportation (Medical): Yes    Lack of Transportation (Non-Medical): No  Physical Activity: Not on file  Stress: Not on file  Social Connections: Moderately Integrated (06/27/2024)   Social Connection and Isolation Panel    Frequency of Communication with Friends and Family: More than three times a week    Frequency of Social Gatherings with Friends and Family: Once a week    Attends Religious Services: More than 4 times per year    Active Member of [employer] or Organizations: Yes    Attends Banker Meetings: More than 4 times per year    Marital Status: Widowed  Intimate Partner Violence: Not At Risk (06/27/2024)   Epic    Fear of Current or Ex-Partner: No    Emotionally Abused: No    Physically Abused: No    Sexually Abused: No  Depression (PHQ2-9): Not on file  Alcohol  Screen: Not on file  Housing: Low Risk (06/27/2024)   Epic    Unable to Pay for Housing in the Last Year: No    Number of Times Moved in the Last Year: 0    Homeless in the Last Year: No  Utilities: Not At Risk (06/27/2024)   Epic    Threatened with loss of utilities: No  Health Literacy: Not on file    Family History:    Family History  Problem Relation Age  of Onset   Depression Mother    Hypertension Father    Breast cancer Neg Hx     ROS:  Review of Systems: [y] = yes, [ ]  = no      General: Weight gain [ ] ; Weight loss [ ] ; Anorexia [ ] ; Fatigue [ ] ; Fever [ ] ; Chills [ ] ; Weakness [ ]    Cardiac: Chest pain/pressure [ ] ; Resting SOB [ ] ; Exertional SOB [ ] ; Orthopnea [ ] ; Pedal Edema [ ] ; Palpitations [ ] ; Syncope [ ] ; Presyncope [ ] ; Paroxysmal nocturnal dyspnea [ ]    Pulmonary: Cough [ ] ; Wheezing [ ] ; Hemoptysis [ ] ; Sputum [ ] ; Snoring [ ]    GI: Vomiting [ ] ; Dysphagia [ ] ; Melena [ ] ; Hematochezia [ ] ; Heartburn [ ] ; Abdominal pain [ ] ; Constipation [ ] ; Diarrhea [ ] ; BRBPR [ ]    GU: Hematuria [ ] ; Dysuria [ ] ; Nocturia [ ]  Vascular: Pain in legs with walking [ ] ; Pain in feet with lying flat [ ] ; Non-healing sores [ ] ; Stroke [ ] ; TIA [ ] ; Slurred speech [ ] ;   Neuro: Headaches [ ] ; Vertigo [ ] ; Seizures [ ] ; Paresthesias [ ] ;Blurred vision [ ] ; Diplopia [ ] ; Vision changes [ ]    Ortho/Skin: Arthritis [ ] ; Joint pain [ ] ; Muscle pain [ ] ; Joint swelling [ ] ; Back Pain [ ] ; Rash [ ]    Psych: Depression [ ] ; Anxiety [ ]    Heme: Bleeding problems [ ] ; Clotting disorders [ ] ; Anemia [ ]    Endocrine: Diabetes [ ] ; Thyroid  dysfunction [ ]    Physical Exam/Data:   Vitals:   06/30/24 1310 06/30/24 1340 06/30/24 1355 06/30/24 1540  BP: (!) 81/59 (!) 81/57 (!) 81/54 (!) 123/96  Pulse:    (!) 109  Resp: (!) 28 (!) 28 (!) 39 (!) 30  Temp:    98 F (36.7 C)  TempSrc:    Oral  SpO2:    98%  Weight:      Height:        Intake/Output Summary (Last 24 hours) at 06/30/2024  1806 Last data filed at 06/30/2024 1200 Gross per 24 hour  Intake 1183.71 ml  Output 705 ml  Net 478.71 ml      06/26/2024   11:17 PM 06/25/2024    6:03 AM 06/24/2024    5:00 AM  Last 3 Weights  Weight (lbs) 202 lb 224 lb 13.9 oz 229 lb 4.5 oz  Weight (kg) 91.627 kg 102 kg 104 kg     Body mass index is 33.61 kg/m.  General: conversant, on 4 L [place]  HEENT: normal Cardiac:  normal S1, S2; RRR; no murmur  Lungs:  clear to auscultation bilaterally, no wheezing, rhonchi or rales  Abd: soft, nontender, no hepatomegaly  Ext: no edema Musculoskeletal:  No deformities, BUE and BLE strength normal and equal Skin: warm and dry  Neuro:  CNs 2-12 intact, no focal abnormalities noted Psych:  Normal affect   EKG:  The EKG was personally reviewed and demonstrates: AF/RVR 125, QRS 102, QT/c 402/580 Telemetry:  Telemetry was personally reviewed and demonstrates: AF/RVR 110-120s  Relevant CV Studies: TTE Result date: 06/27/24  1. Apical akinesis, pattern could suggest stress induced cardiomyopathy.  Cannot exlcude possible LAD ischemia. . Left ventricular ejection  fraction, by estimation, is 30 to 35%. The left ventricle has moderately  decreased function. The left ventricle  demonstrates regional wall motion abnormalities (see scoring  diagram/findings for description). There is moderate  left ventricular  hypertrophy. Left ventricular diastolic parameters are consistent with  Grade II diastolic dysfunction  (pseudonormalization). Elevated left atrial pressure.   2. Right ventricular systolic function is normal. The right ventricular  size is normal.   3. The mitral valve is abnormal. Trivial mitral valve regurgitation. No  evidence of mitral stenosis.   4. The aortic valve was not well visualized. There is mild calcification  of the aortic valve. There is mild thickening of the aortic valve. Aortic  valve regurgitation is moderate.   5. The inferior vena cava is normal in size with  <50% respiratory  variability, suggesting right atrial pressure of 8 mmHg.   Laboratory Data:  Chemistry Recent Labs  Lab 06/29/24 0554 06/30/24 0555 06/30/24 1523  NA 139 142 139  K 3.7 3.2* 3.7  CL 95* 98 95*  CO2 19* 26 21*  GLUCOSE 248* 136* 255*  BUN 75* 79* 77*  CREATININE 3.40* 2.89* 3.02*  CALCIUM  8.4* 9.1 8.9  GFRNONAA 14* 17* 16*  ANIONGAP 25* 18* 22*    Recent Labs  Lab 06/28/24 0531 06/29/24 0554 06/30/24 0555  PROT 5.7* 5.9* 5.8*  ALBUMIN  2.9* 2.8* 2.7*  AST 4,736* 1,888* 755*  ALT 2,637* 2,069* 1,438*  ALKPHOS 184* 249* 208*  BILITOT 1.0 1.1 0.9   Hematology Recent Labs  Lab 06/29/24 0554 06/30/24 0555 06/30/24 1523  WBC 25.2* 16.8* 40.5*  RBC 3.64* 3.73* 3.39*  HGB 11.0* 11.0* 10.1*  HCT 32.6* 34.2* 31.5*  MCV 89.6 91.7 92.9  MCH 30.2 29.5 29.8  MCHC 33.7 32.2 32.1  RDW 15.8* 15.9* 16.0*  PLT 304 275 247   BNP Recent Labs  Lab 06/27/24 0002 06/27/24 0705  PROBNP 8,463.0* 67,156.9*    DDimer No results for input(s): DDIMER in the last 168 hours.  Radiology/Studies:  DG C-Arm 1-60 Min Result Date: 06/30/2024 CLINICAL DATA:  Cystoscopy.  Left ureteral stent placement. EXAM: DG C-ARM 1-60 MIN CONTRAST:  Not provided. FLUOROSCOPY: Fluoroscopy Time:  1 minute 41 seconds Radiation Exposure Index (if provided by the fluoroscopic device): 48.17 mGy Number of Acquired Spot Images: 5 COMPARISON:  CT 06/28/2024 FINDINGS: Five fluoroscopic spot views submitted from the operating/procedure room. Contrast opacifies the renal collecting system with placement of ureteral stent. IMPRESSION: Procedural fluoroscopy during cystoscopy. Please reference procedure report for details. Electronically Signed   By: Andrea Gasman M.D.   On: 06/30/2024 17:17   DG C-Arm 1-60 Min-No Report Result Date: 06/30/2024 Fluoroscopy was utilized by the requesting physician.  No radiographic interpretation.   DG C-Arm 1-60 Min-No Report Result Date:  06/30/2024 Fluoroscopy was utilized by the requesting physician.  No radiographic interpretation.   DG Thoracic Spine 1 View Result Date: 06/29/2024 CLINICAL DATA:  Back pain EXAM: THORACIC SPINE 1 VIEW COMPARISON:  None Available. FINDINGS: Lateral views of the thoracic spine submitted. Slight exaggerated thoracic kyphosis. Mild diffuse degenerative disc disease with disc space narrowing and spurring. No evidence of fracture or compression deformity. IMPRESSION: Mild diffuse degenerative disc disease with only lateral view submitted. Electronically Signed   By: Andrea Gasman M.D.   On: 06/29/2024 12:14   DG Lumbar Spine 1 View Result Date: 06/29/2024 CLINICAL DATA:  Back pain.  Recurrent falls. EXAM: LUMBAR SPINE - 1 VIEW COMPARISON:  Reformats from abdominopelvic CT. FINDINGS: Single lateral view of the lumbar spine submitted. Grade 1 anterolisthesis of L3 on L4 and L4 on L5. No evidence of fracture or compression deformity. Degenerative disc disease at L3-L4, L4-L5, and L5-S1. Multilevel  facet hypertrophy. IMPRESSION: 1. No evidence of fracture on single lateral view. 2. Grade 1 anterolisthesis of L3 on L4 and L4 on L5. 3. Degenerative disc disease and facet hypertrophy in the lower lumbar spine. Electronically Signed   By: Andrea Gasman M.D.   On: 06/29/2024 12:13   CT ABDOMEN PELVIS WO CONTRAST Addendum Date: 06/28/2024 ADDENDUM REPORT: 06/28/2024 12:06 ADDENDUM: Note that gas within a bladder stone can be a sign of stone infection and close correlation for urinary tract infection recommended. Electronically Signed   By: Camellia Candle M.D.   On: 06/28/2024 12:06   Result Date: 06/28/2024 CLINICAL DATA:  Acute nonlocalized abdominal pain. EXAM: CT ABDOMEN AND PELVIS WITHOUT CONTRAST TECHNIQUE: Multidetector CT imaging of the abdomen and pelvis was performed following the standard protocol without IV contrast. RADIATION DOSE REDUCTION: This exam was performed according to the departmental  dose-optimization program which includes automated exposure control, adjustment of the mA and/or kV according to patient size and/or use of iterative reconstruction technique. COMPARISON:  11/18/2022 FINDINGS: Lower chest: Patchy airspace disease in lung bases is associated with small left pleural effusion. Hepatobiliary: Tiny low-density posterior right hepatic lobe lesion is too small to characterize but statistically is most likely benign. No followup imaging is recommended. Small calcified gallstones evident. Gallbladder otherwise unremarkable. Duodenum is normally positioned as is the ligament of Treitz. Pancreas: No focal mass lesion. No dilatation of the main duct. No intraparenchymal cyst. No peripancreatic edema. Spleen: No splenomegaly. No suspicious focal mass lesion. Adrenals/Urinary Tract: No adrenal nodule or mass. Right kidney is atrophic. Mild left hydroureteronephrosis evident. Upper pole calyx in the left kidney is filled with high density material (axial 26/2 and coronal 89/6). Left ureter is dilated down to the UVJ with no obstructing calcified stone evident. 4.9 x 3.9 x 3.0 cm lamellated structure is identified in the non dependent bladder lumen, apparently representing a lamellated stone with internal gas. Stomach/Bowel: Surgical changes are noted in the stomach. Duodenum is normally positioned as is the ligament of Treitz. Duodenal diverticulum noted. No small bowel wall thickening. No small bowel dilatation. The terminal ileum is normal. The appendix is normal. No gross colonic mass. No colonic wall thickening. Vascular/Lymphatic: There is moderate atherosclerotic calcification of the abdominal aorta without aneurysm. There is no gastrohepatic or hepatoduodenal ligament lymphadenopathy. No retroperitoneal or mesenteric lymphadenopathy. No pelvic sidewall lymphadenopathy. Reproductive: Hysterectomy.  There is no adnexal mass. Other: No intraperitoneal free fluid. Musculoskeletal: No worrisome  lytic or sclerotic osseous abnormality. IMPRESSION: 1. Mild left hydroureteronephrosis with no obstructing calcified stone evident. Given lack of identifiable stone, mucosal lesion in the UVJ must be excluded. 2. Upper pole calyx in the left kidney is filled with high density material, potentially blood products or infectious debris. Urothelial neoplasm could have this appearance. Urology consultation recommended. 3. 4.9 x 3.9 x 3.0 cm lamellated structure in the non dependent bladder lumen, apparently representing a lamellated stone with internal gas. The gas within the stone is apparently creating buoyancy. 4. Patchy airspace disease in lung bases is associated with small left pleural effusion. Imaging features could be related to atelectasis or pneumonia. 5. Cholelithiasis. 6.  Aortic Atherosclerosis (ICD10-I70.0). Electronically Signed: By: Camellia Candle M.D. On: 06/28/2024 11:57   US  EKG SITE RITE Result Date: 06/27/2024 If Site Rite image not attached, placement could not be confirmed due to current cardiac rhythm.  ECHOCARDIOGRAM COMPLETE Result Date: 06/27/2024    ECHOCARDIOGRAM REPORT   Patient Name:   Lauren Clark Date of Exam:  06/27/2024 Medical Rec #:  [id number]    Height:       65.0 in Accession #:    [id number]   Weight:       202.0 lb Date of Birth:  10-16-1955    BSA:          1.987 m Patient Age:    68 years     BP:           105/52 mmHg Patient Gender: F            HR:           56 bpm. Exam Location:  Inpatient Procedure: 2D Echo, Cardiac Doppler, Color Doppler and Intracardiac            Opacification Agent (Both Spectral and Color Flow Doppler were            utilized during procedure). STAT ECHO Indications:    Dyspnea  History:        Patient has prior history of Echocardiogram examinations, most                 recent 05/20/2024. CHF, COPD; Risk Factors:Hypertension and                 Dyslipidemia.  Sonographer:    Philomena Daring Referring Phys: JONATHAN F BRANCH  Sonographer Comments:  Patient is obese. IMPRESSIONS  1. Apical akinesis, pattern could suggest stress induced cardiomyopathy. Cannot exlcude possible LAD ischemia. . Left ventricular ejection fraction, by estimation, is 30 to 35%. The left ventricle has moderately decreased function. The left ventricle demonstrates regional wall motion abnormalities (see scoring diagram/findings for description). There is moderate left ventricular hypertrophy. Left ventricular diastolic parameters are consistent with Grade II diastolic dysfunction (pseudonormalization). Elevated left atrial pressure.  2. Right ventricular systolic function is normal. The right ventricular size is normal.  3. The mitral valve is abnormal. Trivial mitral valve regurgitation. No evidence of mitral stenosis.  4. The aortic valve was not well visualized. There is mild calcification of the aortic valve. There is mild thickening of the aortic valve. Aortic valve regurgitation is moderate.  5. The inferior vena cava is normal in size with <50% respiratory variability, suggesting right atrial pressure of 8 mmHg. FINDINGS  Left Ventricle: Apical akinesis, pattern could suggest stress induced cardiomyopathy. Cannot exlcude possible LAD ischemia. Left ventricular ejection fraction, by estimation, is 30 to 35%. The left ventricle has moderately decreased function. The left ventricle demonstrates regional wall motion abnormalities. Definity  contrast agent was given IV to delineate the left ventricular endocardial borders. The left ventricular internal cavity size was normal in size. There is moderate left ventricular hypertrophy. Left ventricular diastolic parameters are consistent with Grade II diastolic dysfunction (pseudonormalization). Elevated left atrial pressure.  LV Wall Scoring: The mid and distal anterior septum, apical lateral segment, apical anterior segment, and apex are akinetic. Right Ventricle: The right ventricular size is normal. Right vetricular wall thickness was  not well visualized. Right ventricular systolic function is normal. Left Atrium: Left atrial size was not well visualized. Right Atrium: Right atrial size was not well visualized. Pericardium: There is no evidence of pericardial effusion. Mitral Valve: The mitral valve is abnormal. Trivial mitral valve regurgitation. No evidence of mitral valve stenosis. Tricuspid Valve: The tricuspid valve is not well visualized. Tricuspid valve regurgitation is trivial. No evidence of tricuspid stenosis. Aortic Valve: The aortic valve was not well visualized. There is mild calcification of the aortic valve. There is mild thickening of  the aortic valve. There is mild aortic valve annular calcification. Aortic valve regurgitation is moderate. Aortic valve mean gradient measures 4.1 mmHg. Aortic valve peak gradient measures 10.4 mmHg. Aortic valve area, by VTI measures 2.79 cm. Pulmonic Valve: The pulmonic valve was not well visualized. Pulmonic valve regurgitation is not visualized. No evidence of pulmonic stenosis. Aorta: The aortic root was not well visualized. Venous: The inferior vena cava is normal in size with less than 50% respiratory variability, suggesting right atrial pressure of 8 mmHg. IAS/Shunts: No atrial level shunt detected by color flow Doppler.  LEFT VENTRICLE PLAX 2D LVIDd:         5.10 cm   Diastology LVIDs:         3.60 cm   LV e' medial:    5.33 cm/s LV PW:         1.20 cm   LV E/e' medial:  16.2 LV IVS:        1.30 cm   LV e' lateral:   7.83 cm/s LVOT diam:     1.90 cm   LV E/e' lateral: 11.0 LV SV:         86 LV SV Index:   43 LVOT Area:     2.84 cm  RIGHT VENTRICLE             IVC RV S prime:     16.50 cm/s  IVC diam: 2.10 cm TAPSE (M-mode): 2.2 cm LEFT ATRIUM             Index        RIGHT ATRIUM           Index LA diam:        4.10 cm 2.06 cm/m   RA Area:     16.20 cm LA Vol (A2C):   46.7 ml 23.51 ml/m  RA Volume:   35.50 ml  17.87 ml/m LA Vol (A4C):   52.6 ml 26.48 ml/m LA Biplane Vol: 51.0 ml  25.67 ml/m  AORTIC VALVE AV Area (Vmax):    2.27 cm AV Area (Vmean):   2.68 cm AV Area (VTI):     2.79 cm AV Vmax:           161.13 cm/s AV Vmean:          90.612 cm/s AV VTI:            0.309 m AV Peak Grad:      10.4 mmHg AV Mean Grad:      4.1 mmHg LVOT Vmax:         129.00 cm/s LVOT Vmean:        85.600 cm/s LVOT VTI:          0.304 m LVOT/AV VTI ratio: 0.98 MITRAL VALVE MV Area (PHT): 4.57 cm    SHUNTS MV Decel Time: 166 msec    Systemic VTI:  0.30 m MV E velocity: 86.20 cm/s  Systemic Diam: 1.90 cm MV A velocity: 59.80 cm/s MV E/A ratio:  1.44 Dorn Ross MD Electronically signed by Dorn Ross MD Signature Date/Time: 06/27/2024/9:19:25 AM    Final    CT CHEST WO CONTRAST Result Date: 06/27/2024 CLINICAL DATA:  Respiratory illness. EXAM: CT CHEST WITHOUT CONTRAST TECHNIQUE: Multidetector CT imaging of the chest was performed following the standard protocol without IV contrast. RADIATION DOSE REDUCTION: This exam was performed according to the departmental dose-optimization program which includes automated exposure control, adjustment of the mA and/or kV according to patient size and/or use of  iterative reconstruction technique. COMPARISON:  Chest x-ray 1 day prior.  Chest CT 04/22/2024 FINDINGS: Cardiovascular: The heart is enlarged. No substantial pericardial effusion. Coronary artery calcification is evident. Mild atherosclerotic calcification is noted in the wall of the thoracic aorta. Ascending thoracic aorta measures 4.1 cm diameter compatible with aneurysm. Mediastinum/Nodes: No mediastinal lymphadenopathy. No evidence for gross hilar lymphadenopathy although assessment is limited by the lack of intravenous contrast on the current study. The esophagus has normal imaging features. There is no axillary lymphadenopathy. Lungs/Pleura: Interval development of patchy ground-glass opacity with interlobular septal thickening in both upper lobes, left greater than right. Confluent areas of  consolidative airspace disease are seen in a patchy distribution of the mid and lower lungs bilaterally. Trace left pleural effusion evident. Upper Abdomen: Surgical changes noted in the stomach. Musculoskeletal: No worrisome lytic or sclerotic osseous abnormality. IMPRESSION: 1. Interval development of patchy ground-glass opacity with interlobular septal thickening in both upper lobes, left greater than right. Confluent areas of consolidative airspace disease are seen in a patchy distribution of the mid and lower lungs bilaterally. Imaging features are compatible with multifocal pneumonia. 2. Trace left pleural effusion. 3.  Aortic Atherosclerosis (ICD10-I70.0). Electronically Signed   By: Camellia Candle M.D.   On: 06/27/2024 06:38   US  Abdomen Limited RUQ (LIVER/GB) Result Date: 06/27/2024 CLINICAL DATA:  Epigastric pain. EXAM: ULTRASOUND ABDOMEN LIMITED RIGHT UPPER QUADRANT COMPARISON:  Ultrasound 1 week ago 06/20/2024 FINDINGS: Gallbladder: Physiologically distended. No gallbladder wall thickening. Small gallstones. No pericholecystic fluid. No sonographic Murphy sign noted by sonographer. Common bile duct: Diameter: 4 mm, normal Liver: Diffusely increased parenchymal echogenicity. The echogenic lesion on prior exam was not delineated on the current exam. Technically limited due to difficulty with patient positioning. Portal vein is patent on color Doppler imaging with normal direction of blood flow towards the liver. Other: No right upper quadrant ascites. IMPRESSION: 1. Gallstones without sonographic findings of acute cholecystitis. 2. Increased hepatic parenchymal echogenicity typical of steatosis. 3. Previous echogenic right lobe lesion is not seen on the current exam. Electronically Signed   By: Andrea Gasman M.D.   On: 06/27/2024 01:22   CT Head Wo Contrast Result Date: 06/27/2024 EXAM: CT HEAD AND CERVICAL SPINE 06/26/2024 11:53:00 PM TECHNIQUE: CT of the head and cervical spine was performed  without the administration of intravenous contrast. Multiplanar reformatted images are provided for review. Automated exposure control, iterative reconstruction, and/or weight based adjustment of the mA/kV was utilized to reduce the radiation dose to as low as reasonably achievable. COMPARISON: None available. CLINICAL HISTORY: Head trauma, minor (Age >= 65y) FINDINGS: CT HEAD BRAIN AND VENTRICLES: No acute intracranial hemorrhage. No mass effect or midline shift. No abnormal extra-axial fluid collection. No evidence of acute infarct. No hydrocephalus. A 1.1 cm calcified extra-axial dural based mass along the anterior falx is unchanged. ORBITS: No acute abnormality. SINUSES AND MASTOIDS: No acute abnormality. SOFT TISSUES AND SKULL: No acute skull fracture. No acute soft tissue abnormality. CT CERVICAL SPINE BONES AND ALIGNMENT: No acute fracture or traumatic malalignment. DEGENERATIVE CHANGES: No significant degenerative changes. SOFT TISSUES: No prevertebral soft tissue swelling. IMPRESSION: 1. No acute intracranial abnormality. 2. No acute fracture or traumatic malalignment of the cervical spine. 3. Unchanged small putative left parfalcine meningioma. Electronically signed by: Gilmore Molt 06/27/2024 12:03 AM EST RP Workstation: [id number]   CT Cervical Spine Wo Contrast Result Date: 06/27/2024 EXAM: CT HEAD AND CERVICAL SPINE 06/26/2024 11:53:00 PM TECHNIQUE: CT of the head and cervical spine was performed without the  administration of intravenous contrast. Multiplanar reformatted images are provided for review. Automated exposure control, iterative reconstruction, and/or weight based adjustment of the mA/kV was utilized to reduce the radiation dose to as low as reasonably achievable. COMPARISON: None available. CLINICAL HISTORY: Head trauma, minor (Age >= 65y) FINDINGS: CT HEAD BRAIN AND VENTRICLES: No acute intracranial hemorrhage. No mass effect or midline shift. No abnormal extra-axial fluid  collection. No evidence of acute infarct. No hydrocephalus. A 1.1 cm calcified extra-axial dural based mass along the anterior falx is unchanged. ORBITS: No acute abnormality. SINUSES AND MASTOIDS: No acute abnormality. SOFT TISSUES AND SKULL: No acute skull fracture. No acute soft tissue abnormality. CT CERVICAL SPINE BONES AND ALIGNMENT: No acute fracture or traumatic malalignment. DEGENERATIVE CHANGES: No significant degenerative changes. SOFT TISSUES: No prevertebral soft tissue swelling. IMPRESSION: 1. No acute intracranial abnormality. 2. No acute fracture or traumatic malalignment of the cervical spine. 3. Unchanged small putative left parfalcine meningioma. Electronically signed by: Gilmore Molt 06/27/2024 12:03 AM EST RP Workstation: [id number]   DG Knee Complete 4 Views Left Result Date: 06/26/2024 CLINICAL DATA:  Trauma, fall. EXAM: LEFT KNEE - COMPLETE 4+ VIEW COMPARISON:  06/06/2022 FINDINGS: Knee arthroplasty in expected alignment. Prior patellar resurfacing. No acute or periprosthetic fracture. No periprosthetic lucency. No significant joint effusion. Mild generalized soft tissue edema. IMPRESSION: Knee arthroplasty without complication or acute fracture. Electronically Signed   By: Andrea Gasman M.D.   On: 06/26/2024 23:56   DG Foot Complete Left Result Date: 06/26/2024 CLINICAL DATA:  Trauma, fall. EXAM: LEFT FOOT - COMPLETE 3+ VIEW COMPARISON:  None Available. FINDINGS: There is no evidence of fracture or dislocation. Minor talonavicular degenerative change. Dorsal soft tissue edema. IMPRESSION: Dorsal soft tissue edema. No fracture or subluxation. Electronically Signed   By: Andrea Gasman M.D.   On: 06/26/2024 23:55   DG Knee Complete 4 Views Right Result Date: 06/26/2024 CLINICAL DATA:  Trauma, fall. EXAM: RIGHT KNEE - COMPLETE 4+ VIEW COMPARISON:  11/15/2022 FINDINGS: Knee arthroplasty in expected alignment. Prior patellar resurfacing. No acute or periprosthetic fracture.  Minimal joint effusion. Mild generalized soft tissue edema. IMPRESSION: Knee arthroplasty without complication or acute fracture. Electronically Signed   By: Andrea Gasman M.D.   On: 06/26/2024 23:54   DG Chest Port 1 View Result Date: 06/26/2024 CLINICAL DATA:  Trauma, fall. EXAM: PORTABLE CHEST 1 VIEW COMPARISON:  06/17/2024 FINDINGS: Cardiomegaly is stable. Mediastinal contours are unchanged. Stable interstitial coarsening. Subsegmental atelectasis/scarring in the right mid lung. No pneumothorax. No large pleural effusion. On limited assessment, no acute osseous findings. Bilateral shoulder arthropathy. IMPRESSION: 1. No acute findings or evidence of traumatic injury. 2. Stable cardiomegaly. Electronically Signed   By: Andrea Gasman M.D.   On: 06/26/2024 23:53   DG Pelvis Portable Result Date: 06/26/2024 CLINICAL DATA:  Trauma, fall. EXAM: PORTABLE PELVIS 1-2 VIEWS COMPARISON:  06/16/2024 FINDINGS: Right hip arthroplasty in expected alignment. No acute or periprosthetic fracture. Pubic rami are intact. No pubic symphyseal or sacroiliac diastasis. Left hip osteoarthritis. IMPRESSION: Right hip arthroplasty without complication or acute fracture. Electronically Signed   By: Andrea Gasman M.D.   On: 06/26/2024 23:52   Assessment and Plan:  Lauren Clark is a 68 y.o. female with HFpEF, CKD 3B, paroxysmal AF, morbid obesity s/p gastric sleeve, prior DVT, HTN, HLD, TAA, hypothyroidism, nonobstructive CAD who presents with ADHF in the setting of flu A+ and newly reduced LVEF (55-60%->30-35%) consistent with possible stress-induced cardiomyopathy.   HFrEF likely stress induced CM Flu A+ AKI  on CKD Elevated hsT Persistent AF  She has had a extremely complicated past several weeks with initial admission on 06/15/2024 to 06/22/2024 for AKI, pyelonephritis, ileus, mildly elevated LFTs, and acute on chronic hypoxemic respiratory failure secondary to HFpEF exacerbation.  She then Lauren Clark presented the  same day of discharge for recurrent hypoxic respiratory failure and possible syncope.  On admission her lab work was consistent with septic shock with a profound leukocytosis and lactic acidosis along with flu a positive and ADHF.  Cardiology was initially consulted for mildly elevated troponin which was thought to be demand ischemia in the setting of septic shock.  Additionally she had a newly reduced LVEF from 1 month ago which was thought to be stress-induced cardiomyopathy.  I have been unable to start any GDMT with ongoing AKI, multiorgan failure and hypotension.  Today she went for cystoscopy and was found to have necrotic tissue in her bladder and kidney that was concerning for fungal infection and was sent for pathology.  She has a profound leukocytosis following her procedure today (WBC 40.5 from 16.8) and a mildly worsening lactic acidosis (2.8 from 2.2).  She remains in persistent AF/RVR.  She remains on Zosyn  for sepsis from urinary source and was on prednisone  for her hypoxic respiratory failure.  Micafungin  has been added today with a concern for fungal infection in her urinary tract.  At this point I do not have much to add from a cardiology standpoint until she becomes more stable we have a better understanding of what pathology shows from her cystoscopy today.  There is no way to start any GDMT while she has sepsis secondary to presumed urinary source, AKI on CKD and profound leukocytosis.  Cardiology will keep her on our list but will wait to make additional recommendations once she becomes more stable.  For questions or updates, please contact [facility] Please consult [email or web address] for contact info under   Signed, Donnice Clark Primus, MD  06/30/2024 6:06 PM     [1]  Allergies Allergen Reactions   Crestor  [Rosuvastatin  Calcium ] Other (See Comments)    Muscle aches     Hctz [Hydrochlorothiazide] Other (See Comments)    Pt doesn't remember   Lipitor [Atorvastatin] Other  (See Comments)    Muscle aches    "

## 2024-06-30 NOTE — Plan of Care (Signed)

## 2024-06-30 NOTE — Progress Notes (Addendum)
 " PROGRESS NOTE    Lauren Clark  FMW:[id number] DOB: 1955-07-29 DOA: 06/26/2024 PCP: [name], MD  Subjective: No acute events overnight. Informed by nursing of automatic SBP in 70s with MAP 65. Seen and examined at bedside after urologic procedure. Patient tolerated procedure well. Reports having some vaginal discomfort due to the urologic procedure at present. Denies any nausea, vomiting, constipation.    Hospital Course:  As per H&P: 68 y.o. female with medical history significant of COPD, chronic hypoxemic respiratory failure on 2 L home oxygen, CAD, A-fib on Eliquis , HFpEF, ascending aortic aneurysm followed by cardiothoracic surgery, hypertension, hyperlipidemia, hypothyroidism, CKD stage IIIa, anemia, anxiety, depression, GERD, glaucoma, vision impairment, morbid obesity status post gastric sleeve surgery, PVD.  Recent hospital admission 12/13-12/24 for AKI, pyelonephritis, ileus, mildly elevated LFTs, and acute on chronic hypoxemic respiratory failure secondary to CHF exacerbation. Creatinine had peaked to 3.86.  Her diuretics were held and she was given IV albumin .  Creatinine improved to 2.97 at the time of discharge yesterday and nephrology resumed Lasix ..  Completed 7 days of Rocephin  for pyelonephritis.  She was evaluated by physical therapy and discharged with home health PT/OT yesterday afternoon.  Patient returned to the ED via EMS in the evening due to concern for shortness of breath, fall at home, and possible syncope.  She was saturating in the 80s on her home 2 L [place] with EMS.  She was wheezing on exam and given given albuterol  treatment by EMS.  Patient noted to have bruising on her left foot and complained of knee pain as well.  In the ED, patient was initially saturating well on 4.5 L [place] but after RN laid her down in the bed to place bedpan, patient desaturated to the mid 80s and oxygen saturation did not improve with nonrebreather.  She became tachypneic to the 30s and was  subsequently placed on BiPAP with improvement of work of breathing and improvement of oxygen saturation to the 90s.  Afebrile.  Labs showing worsening leukocytosis (WBC count now 35.1), sodium 131, bicarb 17, anion gap 16, glucose 160, BUN 56, creatinine 3.0, AST 586, ALT 276, alk phos 203, T. bili 1.2, lactic acid 4.1>3.3, CK 385, proBNP 8463, influenza A PCR positive, no blood cultures drawn.  UA showing large amount of leukocytes, 21-50 RBCs, 21-50 WBCs, and many bacteria EKG showing sinus or ectopic atrial rhythm with borderline PR prolongation, QTc 531, and no acute ischemic changes.  Troponin 157.  X-rays of pelvis, bilateral knees, and left foot negative for fractures.  Chest x-ray showing no acute findings or evidence of traumatic injury.  CT head/C-spine negative for acute findings.  Right upper quadrant abdominal ultrasound showing gallstones without sonographic findings of acute cholecystitis.  Showing increased hepatic parenchymal echogenicity typical of steatosis.  Patient was given Solu-Medrol  125 mg, vancomycin , Zosyn , IV mag 2 g, and continuous albuterol  neb treatment in the ED.   Assessment and Plan:  Acute on chronic hypoxemic respiratory failure Patient was noted to be saturating in the 80s on her home 2 L [place] with EMS.  She was wheezing with EMS and was given albuterol  treatment. - currently on 4L < BiPAP, on 2L via [place] at home - etiology unclear, likely multifactorial in the setting of acute COPD exacerbation, acute influenza A infection with superimposed bacterial PNA, acute CHF exacerbation - less likely acute PE as already on anticoagulation  - Blood gas without evidence of hypercapnia.   - see management of active co-morbidities as  elsewhere - wean O2 as tolerated   Possible acute on chronic HFpEF Hypotension - clinically fluid overload status improving - CXR with no any acute findings but stable cardiomegaly - proBNP 32843 < 8463 - TTE showed apical akinesis concerning for  stress induced cardiomyopathy but cannot exclude LAD distribution ischemia, LVEF 30-35%, RV size/systolic function normal, IVC normal size with <50% variability suggesting RAP - given lasix  40mg  IV once in the ED - one dose IV lasix  80mg  12/26 by cardio - will give 500ml IV fluids over 2 hours today 12/28 - seen by PCCM. Patient wants to avoid procedures including central line placement and wants to try and avoid ICU level of care.  - cont external foley for accurate I/Os - daily weights - monitor on tele - cardiology following - palliative following  Sepsis Acute influenza A infection Possible bacterial PNA Possible complicated UTI - Recently finished 7-day course of Rocephin  for pyelonephritis. Prior Ucx with polymicrobial growth at that time - UA continues to show evidence of pyuria and bacteria.  - Influenza A PCR positive.  - MRSA negative - procalcitonin 8 < 16 - CXR with no any acute findings but stable cardiomegaly - CT chest w/o contrast showed interval development of patchy ground-glass opacity with interlobular septal thickening in both upper lobes, L > R. Confluent areas of consolidative airspace disease are seen in a patchy distribution of the mid and lower lungs bilaterally. Imaging features are compatible with multifocal pneumonia. - CT abdomen pelvis w/o contrast showed mild L hydroureteronephrosis without stone, high density material at upper pole calyx in L kidney that maybe blood product/infectious lesion/neoplasm, lamellated stone with internal gas in urinary bladder, cholelithiasis  - status post ureterocystoscopy w/ L retrograde pyelogram and Cystolitholapaxy 12/28 concerning for possible L renal and urinary bladder amorphous masses - UCx with E. Faecalis, sensitivities pending, final read pending - BCx with NGTD, final read pending  - renal and bladder mass biopsies pending - cont oseltamivir , plan for 10 day course to end 07/07/24 - stopped vancomycin  - cont  zosyn  for now while waiting for E. faecalis sensitivity, duration TBD - ID consulted by urology given concerns the renal and bladder masses are fungal balls, awaiting recs - urology following - palliative following   Paroxysmal Atrial fibrillation - RVR noted overnight 12/26 - cont on amiodarone  given low BP - status post heparin  drip for ACS coverage (~48 hours). - No bridging needed - will need to resume eliquis  when ok by urology team (post procedure)   Acute COPD exacerbation - status post albuterol  nebs, Solu-Medrol  125 mg, and IV mag 2 g in the ED.  - Cont prednisone , plan for 5 days total steroid course to finish 12/29 - cont duonebs ATC - cont albuterol  PRN   Elevated troponin NSTEMI History of CAD - EKG without acute ischemic changes - Trop 69 < 77, peaked at 365 - TTE with apical akinesis - hold home eliquis   - status post heparin  drip for ACS coverage (~48 hours). - likely no plan for cardiac catheterization, need to confirm with cardiology team - hold statin given acute liver failiure - hold metoprolol  given hypotension - monitor on tele - cardiology following   Acute liver failure  - improving - Patient is reporting history of postprandial abdominal pain for several months - Transaminases and alkaline phosphatase worse compared to labs done during recent hospitalization. AST 586, ALT 276, alk phos 203.   - etiology unclear, likely shock liver in the setting of  hypotension due to possible shock state (septic vs cardiogenic) - Right upper quadrant abdominal ultrasound showed gallstones without sonographic findings of acute cholecystitis.  Showing increased hepatic parenchymal echogenicity typical of steatosis.   - Acute hepatitis panel negative  - acetaminophen  level low  - hold home statin therapy - cont NAC protocol - monitor LFTs - She will need outpatient evaluation by general surgery for consideration of cholecystectomy after her acute pulmonary and cardiac  issues resolve.    AKI on CKD stage IIIa High anion gap metabolic acidosis - Creatinine 2.89 < 3.4, peaked at 3.68 this admission, 2.97 on discharge 06/26/24, baseline before last admission was around 1.2.  - concern for fluid overload state - CT A/P findings as elsewhere - status post lasix  IV dose in the ED - occasional diuresis as elsewhere - decrease sodium bicarbonate  650 to 325mg  BID (goal serum bicarb > 22) - avoid nephrotoxic agents - renal dose medications - monitor I/Os, renal function  - discussed with nephrology on 12/24 who has stated they will continue to follow - urology following   Lactic acidosis - likely in the setting of hypotension due to possible shock state (septic vs cardiogenic), NSTEMI and acute liver failure - resolved now - monitor clinically   QT prolongation QTc 531 on EKG.   - amiodarone  as elsewhere - Monitor potassium and magnesium  levels.  -  Avoid QT prolonging drugs if possible.  -  Follow-up repeat EKG.   Hyperlipidemia - hold statin therapy given acute liver failure   Mild chronic hyponatremia - resolved - asymptomatic - likely due to hypervolemia.  - status post intermittent IV Lasix  doses - monitor BMP   Hypothyroidism - cont levothyroxine    Anxiety and depression - cont home fluoxetine    Back pain Dengenerative disc disease Mechanical Fall - pain seems chronic in nature - No traumatic injuries identified on imaging of chest, pelvis, bilateral knees, left foot, head, and C-spine.  Fall precautions.  - Xray T- and L-spine showed diffuse degenerative disc disease - cannot give acetaminophen  given liver failure - want to avoid systemic NSAIDs given AKI on CKD - cont voltaren  gel - cont lidocaine  patch - can consider one time doses of opioids if pain unbearable - PT/OT following  DVT prophylaxis: SCDs Start: 06/29/24 1408  SCDs   Code Status: Limited: Do not attempt resuscitation (DNR) -DNR-LIMITED -Do Not Intubate/DNI    Disposition Plan: TBD Reason for continuing need for hospitalization: severity of illness, IV antibiotics, cardiology recommendations, urology recommendations, infectious disease recommendations, palliative recommendations, monitor renal function, PT follow up   Objective: Vitals:   06/30/24 1030 06/30/24 1045 06/30/24 1100 06/30/24 1115  BP: 103/76 92/71 91/68  91/60  Pulse: 97 (!) 111 (!) 116 (!) 122  Resp: (!) 27 (!) 24 (!) 31   Temp:    97.8 F (36.6 C)  TempSrc:    Axillary  SpO2: (!) 89% 90% 95% 97%  Weight:      Height:        Intake/Output Summary (Last 24 hours) at 06/30/2024 1217 Last data filed at 06/30/2024 1027 Gross per 24 hour  Intake 1430.05 ml  Output 1505 ml  Net -74.95 ml   Filed Weights   06/26/24 2317  Weight: 91.6 kg    Examination:  Physical Exam Vitals and nursing note reviewed.  Constitutional:      General: She is not in acute distress.    Appearance: She is ill-appearing.  Cardiovascular:     Rate and Rhythm: Normal  rate and regular rhythm.     Pulses: Normal pulses.     Heart sounds: Normal heart sounds.  Pulmonary:     Effort: Pulmonary effort is normal. No respiratory distress.     Breath sounds: Normal breath sounds. No wheezing.  Abdominal:     General: Bowel sounds are normal. There is no distension.     Palpations: Abdomen is soft.     Tenderness: There is no abdominal tenderness.  Musculoskeletal:     Right lower leg: No edema.     Left lower leg: No edema.  Neurological:     Mental Status: She is alert.     Data Reviewed: I have personally reviewed following labs and imaging studies  CBC: Recent Labs  Lab 06/25/24 0209 06/26/24 2323 06/27/24 0705 06/28/24 0531 06/28/24 1932 06/29/24 0554 06/30/24 0555  WBC 18.9*   < > 26.5* 30.4* 31.2* 25.2* 16.8*  NEUTROABS 15.7*  --   --  28.8*  --  23.3* 14.9*  HGB 11.2*   < > 11.6* 10.5* 10.9* 11.0* 11.0*  HCT 34.4*   < > 36.7 30.8* 33.2* 32.6* 34.2*  MCV 90.8   < > 94.3  90.1 92.0 89.6 91.7  PLT 248   < > 241 260 310 304 275   < > = values in this interval not displayed.   Basic Metabolic Panel: Recent Labs  Lab 06/24/24 0740 06/25/24 0209 06/27/24 0705 06/27/24 1138 06/27/24 1514 06/28/24 0531 06/29/24 0554 06/30/24 0555  NA 131*   < > 132*  --  134* 137 139 142  K 4.4   < > 4.7  --  4.7 4.1 3.7 3.2*  CL 98   < > 98  --  98 96* 95* 98  CO2 21*   < > 19*  --  16* 17* 19* 26  GLUCOSE 128*   < > 170*  --  229* 226* 248* 136*  BUN 65*   < > 59*  --  64* 69* 75* 79*  CREATININE 3.39*   < > 3.36*  --  3.63* 3.68* 3.40* 2.89*  CALCIUM  8.6*   < > 8.7*  --  8.6* 8.4* 8.4* 9.1  MG  --   --  3.2* 3.1*  --   --   --   --   PHOS 4.1  --   --  5.7*  --   --   --   --    < > = values in this interval not displayed.   GFR: Estimated Creatinine Clearance: 20.5 mL/min (A) (by C-G formula based on SCr of 2.89 mg/dL (H)). Liver Function Tests: Recent Labs  Lab 06/27/24 0705 06/27/24 1514 06/28/24 0531 06/29/24 0554 06/30/24 0555  AST 5,581* 6,589* 4,736* 1,888* 755*  ALT 2,076* 2,684* 2,637* 2,069* 1,438*  ALKPHOS 203* 189* 184* 249* 208*  BILITOT 1.2 1.0 1.0 1.1 0.9  PROT 6.4* 6.0* 5.7* 5.9* 5.8*  ALBUMIN  3.2* 3.0* 2.9* 2.8* 2.7*   No results for input(s): LIPASE, AMYLASE in the last 168 hours. No results for input(s): AMMONIA in the last 168 hours. Coagulation Profile: Recent Labs  Lab 06/26/24 2323  INR 2.4*   Cardiac Enzymes: Recent Labs  Lab 06/27/24 0002 06/27/24 0705  CKTOTAL 385* 894*   ProBNP, BNP (last 5 results) Recent Labs    01/01/24 0851 04/15/24 1032 06/17/24 0135 06/27/24 0002 06/27/24 0705  PROBNP  --   --   --  8,463.0* 32,843.0*  BNP 311.3* 179.8* 567.8*  --   --  HbA1C: No results for input(s): HGBA1C in the last 72 hours. CBG: Recent Labs  Lab 06/26/24 0425  GLUCAP 97   Lipid Profile: No results for input(s): CHOL, HDL, LDLCALC, TRIG, CHOLHDL, LDLDIRECT in the last 72  hours. Thyroid  Function Tests: No results for input(s): TSH, T4TOTAL, FREET4, T3FREE, THYROIDAB in the last 72 hours. Anemia Panel: No results for input(s): VITAMINB12, FOLATE, FERRITIN, TIBC, IRON, RETICCTPCT in the last 72 hours. Sepsis Labs: Recent Labs  Lab 06/26/24 2328 06/27/24 0413 06/27/24 0705 06/27/24 1138 06/27/24 1514 06/29/24 0500  PROCALCITON  --   --   --  16.00  --  8.49  LATICACIDVEN 4.1* 3.3* 1.8  --  1.7  --     Recent Results (from the past 240 hours)  Resp panel by RT-PCR (RSV, Flu A&B, Covid) Anterior Nasal Swab     Status: Abnormal   Collection Time: 06/27/24 12:05 AM   Specimen: Anterior Nasal Swab  Result Value Ref Range Status   SARS Coronavirus 2 by RT PCR NEGATIVE NEGATIVE Final   Influenza A by PCR POSITIVE (A) NEGATIVE Final   Influenza B by PCR NEGATIVE NEGATIVE Final    Comment: (NOTE) The Xpert Xpress SARS-CoV-2/FLU/RSV plus assay is intended as an aid in the diagnosis of influenza from Nasopharyngeal swab specimens and should not be used as a sole basis for treatment. Nasal washings and aspirates are unacceptable for Xpert Xpress SARS-CoV-2/FLU/RSV testing.  Fact Sheet for Patients: bloggercourse.com  Fact Sheet for Healthcare Providers: seriousbroker.it  This test is not yet approved or cleared by the [place]  FDA and has been authorized for detection and/or diagnosis of SARS-CoV-2 by FDA under an Emergency Use Authorization (EUA). This EUA will remain in effect (meaning this test can be used) for the duration of the COVID-19 declaration under Section 564(b)(1) of the Act, 21 U.S.C. section 360bbb-3(b)(1), unless the authorization is terminated or revoked.     Resp Syncytial Virus by PCR NEGATIVE NEGATIVE Final    Comment: (NOTE) Fact Sheet for Patients: bloggercourse.com  Fact Sheet for Healthcare  Providers: seriousbroker.it  This test is not yet approved or cleared by the [place]  FDA and has been authorized for detection and/or diagnosis of SARS-CoV-2 by FDA under an Emergency Use Authorization (EUA). This EUA will remain in effect (meaning this test can be used) for the duration of the COVID-19 declaration under Section 564(b)(1) of the Act, 21 U.S.C. section 360bbb-3(b)(1), unless the authorization is terminated or revoked.  Performed at [facility], [address], [place], [place] [address]   Urine Culture     Status: Abnormal (Preliminary result)   Collection Time: 06/27/24  6:20 AM   Specimen: Urine, Clean Catch  Result Value Ref Range Status   Specimen Description URINE, CLEAN CATCH  Final   Special Requests NONE  Final   Culture (A)  Final    >=100,000 COLONIES/mL ENTEROCOCCUS FAECALIS SUSCEPTIBILITIES TO FOLLOW Performed at [facility], [address] [place], [place] [address]    Report Status PENDING  Incomplete  Culture, blood (Routine X 2) w Reflex to ID Panel     Status: None (Preliminary result)   Collection Time: 06/27/24  6:57 AM   Specimen: BLOOD LEFT HAND  Result Value Ref Range Status   Specimen Description BLOOD LEFT HAND  Final   Special Requests   Final    BOTTLES DRAWN AEROBIC ONLY Blood Culture results may not be optimal due to an inadequate volume of blood received  in culture bottles   Culture   Final    NO GROWTH 3 DAYS Performed at [facility], [address] [place], [place] [address]    Report Status PENDING  Incomplete  Culture, blood (Routine X 2) w Reflex to ID Panel     Status: None (Preliminary result)   Collection Time: 06/27/24  7:45 AM   Specimen: BLOOD  Result Value Ref Range Status   Specimen Description BLOOD RIGHT ANTECUBITAL  Final   Special Requests   Final    BOTTLES DRAWN AEROBIC AND ANAEROBIC Blood Culture adequate volume   Culture   Final    NO GROWTH 3  DAYS Performed at [facility], [address] [place], [place] [address]    Report Status PENDING  Incomplete  MRSA Next Gen by PCR, Nasal     Status: None   Collection Time: 06/27/24 11:38 AM   Specimen: Nasal Mucosa; Nasal Swab  Result Value Ref Range Status   MRSA by PCR Next Gen NOT DETECTED NOT DETECTED Final    Comment: (NOTE) The GeneXpert MRSA Assay (FDA approved for NASAL specimens only), is one component of a comprehensive MRSA colonization surveillance program. It is not intended to diagnose MRSA infection nor to guide or monitor treatment for MRSA infections. Test performance is not FDA approved in patients less than 31 years old. Performed at [facility], [address], [place], [place] [address]      Radiology Studies: DG C-Arm 1-60 Min-No Report Result Date: 06/30/2024 Fluoroscopy was utilized by the requesting physician.  No radiographic interpretation.   DG C-Arm 1-60 Min-No Report Result Date: 06/30/2024 Fluoroscopy was utilized by the requesting physician.  No radiographic interpretation.   DG Thoracic Spine 1 View Result Date: 06/29/2024 CLINICAL DATA:  Back pain EXAM: THORACIC SPINE 1 VIEW COMPARISON:  None Available. FINDINGS: Lateral views of the thoracic spine submitted. Slight exaggerated thoracic kyphosis. Mild diffuse degenerative disc disease with disc space narrowing and spurring. No evidence of fracture or compression deformity. IMPRESSION: Mild diffuse degenerative disc disease with only lateral view submitted. Electronically Signed   By: Andrea Gasman M.D.   On: 06/29/2024 12:14   DG Lumbar Spine 1 View Result Date: 06/29/2024 CLINICAL DATA:  Back pain.  Recurrent falls. EXAM: LUMBAR SPINE - 1 VIEW COMPARISON:  Reformats from abdominopelvic CT. FINDINGS: Single lateral view of the lumbar spine submitted. Grade 1 anterolisthesis of L3 on L4 and L4 on L5. No evidence of fracture or compression deformity. Degenerative disc disease at  L3-L4, L4-L5, and L5-S1. Multilevel facet hypertrophy. IMPRESSION: 1. No evidence of fracture on single lateral view. 2. Grade 1 anterolisthesis of L3 on L4 and L4 on L5. 3. Degenerative disc disease and facet hypertrophy in the lower lumbar spine. Electronically Signed   By: Andrea Gasman M.D.   On: 06/29/2024 12:13    Scheduled Meds:  brimonidine   1 drop Right Eye BID   And   timolol   1 drop Right Eye BID   Chlorhexidine  Gluconate Cloth  6 each Topical Daily   diclofenac  Sodium  2 g Topical TID AC & HS   FLUoxetine   40 mg Oral Daily   guaiFENesin   600 mg Oral BID   latanoprost   1 drop Right Eye QHS   levothyroxine   112 mcg Oral q morning   oseltamivir   30 mg Oral Daily   predniSONE   40 mg Oral Q breakfast   sodium bicarbonate   650 mg Oral BID  sodium chloride  flush  10-40 mL Intracatheter Q12H   Continuous Infusions:  amiodarone  30 mg/hr (06/30/24 0734)   piperacillin -tazobactam (ZOSYN )  IV 2.25 g (06/30/24 0241)   potassium chloride        LOS: 3 days   Norval Bar, MD  [facility]  06/30/2024, 12:17 PM   "

## 2024-06-30 NOTE — Anesthesia Procedure Notes (Signed)
 Procedure Name: LMA Insertion Date/Time: 06/30/2024 7:57 AM  Performed by: Elby Raelene SAUNDERS, CRNAPre-anesthesia Checklist: Patient identified, Emergency Drugs available, Suction available and Patient being monitored Patient Re-evaluated:Patient Re-evaluated prior to induction Oxygen Delivery Method: Circle System Utilized Preoxygenation: Pre-oxygenation with 100% oxygen Induction Type: IV induction Ventilation: Mask ventilation without difficulty LMA: LMA inserted LMA Size: 4.0 Number of attempts: 1 Airway Equipment and Method: Bite block Placement Confirmation: positive ETCO2 Tube secured with: Tape Dental Injury: Teeth and Oropharynx as per pre-operative assessment

## 2024-06-30 NOTE — Anesthesia Postprocedure Evaluation (Signed)
"   Anesthesia Post Note  Patient: Lauren Clark  Procedure(s) Performed: CYSTOSCOPY, WITH BLADDER CALCULUS LITHOLAPAXY (Bladder)     Patient location during evaluation: PACU Anesthesia Type: General Level of consciousness: awake and alert Pain management: pain level controlled Vital Signs Assessment: post-procedure vital signs reviewed and stable Respiratory status: spontaneous breathing, nonlabored ventilation, respiratory function stable and patient connected to nasal cannula oxygen Cardiovascular status: blood pressure returned to baseline and stable Postop Assessment: no apparent nausea or vomiting Anesthetic complications: no   No notable events documented.  Last Vitals:  Vitals:   06/30/24 1340 06/30/24 1355  BP: (!) 81/57 (!) 81/54  Pulse:    Resp: (!) 28 (!) 39  Temp:    SpO2:      Last Pain:  Vitals:   06/30/24 1115  TempSrc: Axillary  PainSc: 6                  Epifanio [place] E      "

## 2024-06-30 NOTE — Transfer of Care (Signed)
 Immediate Anesthesia Transfer of Care Note  Patient: Lauren Clark  Procedure(s) Performed: CYSTOSCOPY, WITH BLADDER CALCULUS LITHOLAPAXY (Bladder)  Patient Location: PACU  Anesthesia Type:General  Level of Consciousness: awake, alert , and oriented  Airway & Oxygen Therapy: Patient Spontanous Breathing and Patient connected to nasal cannula oxygen  Post-op Assessment: Report given to RN and Post -op Vital signs reviewed and stable  Post vital signs: Reviewed and stable  Last Vitals:  Vitals Value Taken Time  BP 89/71 06/30/24 10:25  Temp 36.3 C 06/30/24 10:23  Pulse 112 06/30/24 10:27  Resp 50 06/30/24 10:27  SpO2 88 % 06/30/24 10:27  Vitals shown include unfiled device data.  Last Pain:  Vitals:   06/30/24 0652  TempSrc: Oral  PainSc:          Complications: No notable events documented.

## 2024-06-30 NOTE — TOC Initial Note (Signed)
 Transition of Care [facility]) - Initial/Assessment Note    Patient Details  Name: Lauren Clark MRN: [id number] Date of Birth: [birth year]  Transition [facility]) CM/SW Contact:    Darin JULIANNA Bend, LCSW Phone Number: 06/30/2024, 2:41 PM  Clinical Narrative:                 CSW followed-up disposition recommendations (SNF placement).  CSW completed initial TOC work-up/assessment as noted by the following below.   CSW spoke with the patient to review SNF referral process per clinical recommendations and assessed the pt's SNF preference.   The patient expressed no preference.  CSW updated the bedside nurse and additional clinical members to the information above regarding SNF initial efforts for SNF placement.    CSW referral efforts to support the patient's disposition FL2:  PASRR:  SNF referrals:   TOC Disposition follow-up needs  Please provide the patient or natural support with bed-offer updates.  Please continue with SNF placement efforts. Please update the clinical team to SNF placement efforts:  No other needs identified by this clinical research associate currently. Patient needs and current disposition to be followed by    Expected Discharge Plan: Skilled Nursing Facility Barriers to Discharge: Continued Medical Work up   Patient Goals and CMS Choice Patient states their goals for this hospitalization and ongoing recovery are:: To go home   Choice offered to / list presented to : NA      Expected Discharge Plan and Services       Living arrangements for the past 2 months: Apartment                     Prior Living Arrangements/Services Living arrangements for the past 2 months: Apartment Lives with:: Self   Do you feel safe going back to the place where you live?: Yes      Need for Family Participation in Patient Care: No (Comment) Care giver support system in place?: Yes (comment) Current home services: DME Criminal Activity/Legal Involvement Pertinent to Current  Situation/Hospitalization: No - Comment as needed  Activities of Daily Living   ADL Screening (condition at time of admission) Independently performs ADLs?: No Is the patient deaf or have difficulty hearing?: No Does the patient have difficulty seeing, even when wearing glasses/contacts?: Yes Does the patient have difficulty concentrating, remembering, or making decisions?: No  Permission Sought/Granted Permission sought to share information with : Case Manager, Magazine Features Editor Permission granted to share information with : Yes, Verbal Permission Granted           Permission granted to share info w Contact Information: Mcferran,Sandy  Emotional Assessment       Orientation: : Oriented to Self, Oriented to  Time, Oriented to Place, Oriented to Situation Alcohol / Substance Use: Not Applicable Psych Involvement: No (comment)  Admission diagnosis:  Transaminitis [R74.01] Acute on chronic respiratory failure with hypoxemia (HCC) [J96.21] Acute hypoxic respiratory failure (HCC) [J96.01] Patient Active Problem List   Diagnosis Date Noted   HFrEF (heart failure with reduced ejection fraction) (HCC) 06/29/2024   Pressure injury of skin 06/29/2024   Acute on chronic respiratory failure with hypoxemia (HCC) 06/27/2024   Elevated troponin 06/27/2024   UTI (urinary tract infection) 06/27/2024   Acute HFrEF (heart failure with reduced ejection fraction) (HCC) 06/27/2024   Acute renal failure superimposed on stage 3a chronic kidney disease (HCC) 06/15/2024   Pyelonephritis of left kidney 06/15/2024   COPD with acute exacerbation (HCC) 06/15/2024   AKI (acute  kidney injury) 01/13/2023   Hypothyroidism 01/12/2023   Acute on chronic diastolic CHF (congestive heart failure) (HCC) 01/11/2023   Chronic heart failure with preserved ejection fraction (HFpEF) (HCC) 01/11/2023   Atrial fibrillation, chronic (HCC) 11/18/2022   Intramural aortic hematoma (HCC) 11/18/2022   Aortic  dissection (HCC) 11/12/2022   Positive hepatitis C antibody test 09/02/2019   S/P left TKA 09/05/2017   S/P total knee replacement 09/05/2017   S/P right THA, AA 06/14/2016   Prediabetes 02/29/2016   Dyslipidemia 02/29/2016   Degenerative joint disease of right hip 02/29/2016   S/P laparoscopic sleeve gastrectomy 02/29/2016   Preoperative cardiovascular examination 02/19/2016   Hyperlipidemia 02/19/2016   Class 3 obesity (HCC) 02/19/2016   Essential hypertension 02/19/2016   PCP:  [name], MD Pharmacy:   [facility] [phone] [address] [place], [place] - [address] AT [facility] OF [facility] [address] & [address] [place] [address] Phone: [phone] Fax: [phone]  [facility] #[id number] - [place], [place] - [address] AT [facility] OF [address] & [address] [place] [address] Phone: [phone] Fax: [phone]     Social Drivers of Health (SDOH) Social History: SDOH Screenings   Food Insecurity: Food Insecurity Present (06/27/2024)  Housing: Low Risk (06/27/2024)  Transportation Needs: Unmet Transportation Needs (06/27/2024)  Utilities: Not At Risk (06/27/2024)  Social Connections: Moderately Integrated (06/27/2024)  Tobacco Use: Low Risk (06/30/2024)   SDOH Interventions:     Readmission Risk Interventions     No data to display

## 2024-06-30 NOTE — NC FL2 (Signed)
 "   MEDICAID FL2 LEVEL OF CARE FORM     IDENTIFICATION  Patient Name: Lauren Clark Birthdate: 10/19/1955 Sex: female Admission Date (Current Location): 06/26/2024  [facility] and [place] Number:  Producer, Television/film/video and Address:  [facility], [address] [place], [place] [address]      Provider Number: [id number]  Attending Physician Name and Address:  Cosette Blackwater, MD  Relative Name and Phone Number:       Current Level of Care: Hospital Recommended Level of Care: Skilled Nursing Facility Prior Approval Number:    Date Approved/Denied: 06/30/24 PASRR Number: [id number]  Discharge Plan: SNF    Current Diagnoses: Patient Active Problem List   Diagnosis Date Noted   HFrEF (heart failure with reduced ejection fraction) (HCC) 06/29/2024   Pressure injury of skin 06/29/2024   Acute on chronic respiratory failure with hypoxemia (HCC) 06/27/2024   Elevated troponin 06/27/2024   UTI (urinary tract infection) 06/27/2024   Acute HFrEF (heart failure with reduced ejection fraction) (HCC) 06/27/2024   Acute renal failure superimposed on stage 3a chronic kidney disease (HCC) 06/15/2024   Pyelonephritis of left kidney 06/15/2024   COPD with acute exacerbation (HCC) 06/15/2024   AKI (acute kidney injury) 01/13/2023   Hypothyroidism 01/12/2023   Acute on chronic diastolic CHF (congestive heart failure) (HCC) 01/11/2023   Chronic heart failure with preserved ejection fraction (HFpEF) (HCC) 01/11/2023   Atrial fibrillation, chronic (HCC) 11/18/2022   Intramural aortic hematoma (HCC) 11/18/2022   Aortic dissection (HCC) 11/12/2022   Positive hepatitis C antibody test 09/02/2019   S/P left TKA 09/05/2017   S/P total knee replacement 09/05/2017   S/P right THA, AA 06/14/2016   Prediabetes 02/29/2016   Dyslipidemia 02/29/2016   Degenerative joint disease of right hip 02/29/2016   S/P laparoscopic sleeve gastrectomy 02/29/2016   Preoperative  cardiovascular examination 02/19/2016   Hyperlipidemia 02/19/2016   Class 3 obesity (HCC) 02/19/2016   Essential hypertension 02/19/2016    Orientation RESPIRATION BLADDER Height & Weight     Self, Place, Situation, Time  Normal, O2 Incontinent Weight: 202 lb (91.6 kg) Height:  5' 5 (165.1 cm)  BEHAVIORAL SYMPTOMS/MOOD NEUROLOGICAL BOWEL NUTRITION STATUS      Continent Diet  AMBULATORY STATUS COMMUNICATION OF NEEDS Skin     Verbally                         Personal Care Assistance Level of Assistance              Functional Limitations Info  Sight, Hearing Sight Info: Impaired Hearing Info: Adequate      SPECIAL CARE FACTORS FREQUENCY                       Contractures Contractures Info: Not present    Additional Factors Info  Allergies, Code Status   Allergies Info: Hydrochlorothiazide, Crestor  (Rosuvastatin  Calcium ), Lipitor (Atorvastatin)           Current Medications (06/30/2024):  This is the current hospital active medication list Current Facility-Administered Medications  Medication Dose Route Frequency Provider Last Rate Last Admin   albuterol  (PROVENTIL ) (2.5 MG/3ML) 0.083% nebulizer solution 2.5 mg  2.5 mg Nebulization Q4H PRN Alfornia Madison, MD       amiodarone  (NEXTERONE  PREMIX) 360-4.14 MG/200ML-% (1.8 mg/mL) IV infusion  30 mg/hr Intravenous Continuous Otelia Gillian HERO, MD 16.67 mL/hr at 06/30/24 1249 30 mg/hr at 06/30/24 1249   brimonidine  (  ALPHAGAN ) 0.2 % ophthalmic solution 1 drop  1 drop Right Eye BID Hammons, Suzen NOVAK, RPH   1 drop at 06/29/24 2224   And   timolol  (TIMOPTIC ) 0.5 % ophthalmic solution 1 drop  1 drop Right Eye BID Hammons, Suzen NOVAK, RPH   1 drop at 06/29/24 2224   calcium  carbonate (TUMS - dosed in mg elemental calcium ) chewable tablet 200 mg of elemental calcium   1 tablet Oral TID PRN Tariq, Hassan, MD   200 mg of elemental calcium  at 06/29/24 1658   Chlorhexidine  Gluconate Cloth 2 % PADS 6 each  6 each  Topical Daily Tariq, Hassan, MD   6 each at 06/30/24 9378   diclofenac  Sodium (VOLTAREN ) 1 % topical gel 2 g  2 g Topical TID AC & HS Tariq, Hassan, MD   2 g at 06/30/24 1236   FLUoxetine  (PROZAC ) capsule 40 mg  40 mg Oral Daily Tariq, Hassan, MD   40 mg at 06/30/24 1230   guaiFENesin  (MUCINEX ) 12 hr tablet 600 mg  600 mg Oral BID Tariq, Hassan, MD   600 mg at 06/30/24 1230   latanoprost  (XALATAN ) 0.005 % ophthalmic solution 1 drop  1 drop Right Eye QHS Tariq, Hassan, MD   1 drop at 06/29/24 2224   levothyroxine  (SYNTHROID ) tablet 112 mcg  112 mcg Oral q morning Tariq, Hassan, MD   112 mcg at 06/29/24 9471   micafungin  (MYCAMINE ) 100 mg in sodium chloride  0.9 % 100 mL IVPB  100 mg Intravenous Q24H Tariq, Hassan, MD       oseltamivir  (TAMIFLU ) capsule 30 mg  30 mg Oral Daily Tariq, Hassan, MD   30 mg at 06/30/24 1231   piperacillin -tazobactam (ZOSYN ) IVPB 2.25 g  2.25 g Intravenous Q8H Tariq, Hassan, MD 100 mL/hr at 06/30/24 0241 2.25 g at 06/30/24 0241   potassium chloride  10 mEq in 100 mL IVPB  10 mEq Intravenous Q1 Hr x 6 Cosette Blackwater, MD 100 mL/hr at 06/30/24 1246 10 mEq at 06/30/24 1246   predniSONE  (DELTASONE ) tablet 40 mg  40 mg Oral Q breakfast Tariq, Hassan, MD   40 mg at 06/30/24 1230   prochlorperazine  (COMPAZINE ) tablet 5 mg  5 mg Oral Q6H PRN Cosette Blackwater, MD       sodium bicarbonate  tablet 325 mg  325 mg Oral BID Cosette Blackwater, MD       sodium chloride  flush (NS) 0.9 % injection 10-40 mL  10-40 mL Intracatheter Q12H Tariq, Hassan, MD   10 mL at 06/29/24 2200   sodium chloride  flush (NS) 0.9 % injection 10-40 mL  10-40 mL Intracatheter PRN Tariq, Hassan, MD         Discharge Medications: Please see discharge summary for a list of discharge medications.  Relevant Imaging Results:  Relevant Lab Results:   Additional Information SS: [id number]  Darin JULIANNA Bend, LCSW     "

## 2024-06-30 NOTE — Anesthesia Preprocedure Evaluation (Signed)
"                                    Anesthesia Evaluation  Patient identified by MRN, date of birth, ID band Patient awake    Reviewed: Allergy & Precautions, NPO status , Patient's Chart, lab work & pertinent test results  Airway Mallampati: II  TM Distance: >3 FB Neck ROM: Full    Dental   Pulmonary pneumonia, COPD,  oxygen dependent   breath sounds clear to auscultation       Cardiovascular hypertension, Pt. on medications + Peripheral Vascular Disease and +CHF  + dysrhythmias Atrial Fibrillation  Rhythm:Regular Rate:Normal  IMPRESSIONS     1. Apical akinesis, pattern could suggest stress induced cardiomyopathy.  Cannot exlcude possible LAD ischemia. . Left ventricular ejection  fraction, by estimation, is 30 to 35%. The left ventricle has moderately  decreased function. The left ventricle  demonstrates regional wall motion abnormalities (see scoring  diagram/findings for description). There is moderate left ventricular  hypertrophy. Left ventricular diastolic parameters are consistent with  Grade II diastolic dysfunction  (pseudonormalization). Elevated left atrial pressure.   2. Right ventricular systolic function is normal. The right ventricular  size is normal.   3. The mitral valve is abnormal. Trivial mitral valve regurgitation. No  evidence of mitral stenosis.   4. The aortic valve was not well visualized. There is mild calcification  of the aortic valve. There is mild thickening of the aortic valve. Aortic  valve regurgitation is moderate.   5. The inferior vena cava is normal in size with <50% respiratory  variability, suggesting right atrial pressure of 8 mmHg.      Neuro/Psych negative neurological ROS     GI/Hepatic Neg liver ROS,GERD  ,,S/p gastric sleeve   Endo/Other  Hypothyroidism    Renal/GU Renal disease     Musculoskeletal  (+) Arthritis ,    Abdominal   Peds  Hematology  (+) Blood dyscrasia, anemia   Anesthesia Other  Findings   Reproductive/Obstetrics                              Anesthesia Physical Anesthesia Plan  ASA: 4  Anesthesia Plan: General   Post-op Pain Management: Ofirmev  IV (intra-op)*   Induction: Intravenous  PONV Risk Score and Plan: 3 and Dexamethasone , Ondansetron  and Treatment may vary due to age or medical condition  Airway Management Planned: LMA  Additional Equipment:   Intra-op Plan:   Post-operative Plan: Extubation in OR  Informed Consent: I have reviewed the patients History and Physical, chart, labs and discussed the procedure including the risks, benefits and alternatives for the proposed anesthesia with the patient or authorized representative who has indicated his/her understanding and acceptance.     Dental advisory given  Plan Discussed with: CRNA  Anesthesia Plan Comments:         Anesthesia Quick Evaluation  "

## 2024-06-30 NOTE — Interval H&P Note (Signed)
 History and Physical Interval Note:  06/30/2024 7:14 AM  Lauren Clark  has presented today for surgery, with the diagnosis of bladder stone.  The various methods of treatment have been discussed with the patient and family. After consideration of risks, benefits and other options for treatment, the patient has consented to  Procedures with comments: CYSTOSCOPY, WITH BLADDER CALCULUS LITHOLAPAXY (N/A) - CYSTOSCOPY, BLADDER STONE REMOVAL USING LASER, AND LEFT URETERAL STENT PLACEMENT as a surgical intervention.  The patient's history has been reviewed, patient examined, no change in status, stable for surgery.  I have reviewed the patient's chart and labs.  Questions were answered to the patient's satisfaction.     Morene LELON Salines

## 2024-07-01 ENCOUNTER — Other Ambulatory Visit (HOSPITAL_COMMUNITY): Payer: Self-pay

## 2024-07-01 ENCOUNTER — Encounter (HOSPITAL_COMMUNITY): Payer: Self-pay | Admitting: Urology

## 2024-07-01 DIAGNOSIS — J9621 Acute and chronic respiratory failure with hypoxia: Secondary | ICD-10-CM | POA: Diagnosis not present

## 2024-07-01 DIAGNOSIS — I4819 Other persistent atrial fibrillation: Secondary | ICD-10-CM

## 2024-07-01 DIAGNOSIS — R7989 Other specified abnormal findings of blood chemistry: Secondary | ICD-10-CM | POA: Diagnosis not present

## 2024-07-01 DIAGNOSIS — I5020 Unspecified systolic (congestive) heart failure: Secondary | ICD-10-CM | POA: Diagnosis not present

## 2024-07-01 LAB — CBC
HCT: 32 % — ABNORMAL LOW (ref 36.0–46.0)
Hemoglobin: 10.2 g/dL — ABNORMAL LOW (ref 12.0–15.0)
MCH: 29.4 pg (ref 26.0–34.0)
MCHC: 31.9 g/dL (ref 30.0–36.0)
MCV: 92.2 fL (ref 80.0–100.0)
Platelets: 203 K/uL (ref 150–400)
RBC: 3.47 MIL/uL — ABNORMAL LOW (ref 3.87–5.11)
RDW: 16.2 % — ABNORMAL HIGH (ref 11.5–15.5)
WBC: 26 K/uL — ABNORMAL HIGH (ref 4.0–10.5)
nRBC: 0.2 % (ref 0.0–0.2)

## 2024-07-01 LAB — BASIC METABOLIC PANEL WITH GFR
Anion gap: 15 (ref 5–15)
BUN: 83 mg/dL — ABNORMAL HIGH (ref 8–23)
CO2: 25 mmol/L (ref 22–32)
Calcium: 9.2 mg/dL (ref 8.9–10.3)
Chloride: 99 mmol/L (ref 98–111)
Creatinine, Ser: 2.9 mg/dL — ABNORMAL HIGH (ref 0.44–1.00)
GFR, Estimated: 17 mL/min — ABNORMAL LOW
Glucose, Bld: 147 mg/dL — ABNORMAL HIGH (ref 70–99)
Potassium: 4.1 mmol/L (ref 3.5–5.1)
Sodium: 140 mmol/L (ref 135–145)

## 2024-07-01 LAB — URINE CULTURE: Culture: >=100000 — AB

## 2024-07-01 LAB — LACTIC ACID, PLASMA: Lactic Acid, Venous: 1.2 mmol/L (ref 0.5–1.9)

## 2024-07-01 LAB — APTT: aPTT: 43 s — ABNORMAL HIGH (ref 24–36)

## 2024-07-01 LAB — HEPATIC FUNCTION PANEL
ALT: 1037 U/L — ABNORMAL HIGH (ref 0–44)
AST: 410 U/L — ABNORMAL HIGH (ref 15–41)
Albumin: 2.9 g/dL — ABNORMAL LOW (ref 3.5–5.0)
Alkaline Phosphatase: 196 U/L — ABNORMAL HIGH (ref 38–126)
Bilirubin, Direct: 0.5 mg/dL — ABNORMAL HIGH (ref 0.0–0.2)
Indirect Bilirubin: 0.3 mg/dL (ref 0.3–0.9)
Total Bilirubin: 0.8 mg/dL (ref 0.0–1.2)
Total Protein: 5.9 g/dL — ABNORMAL LOW (ref 6.5–8.1)

## 2024-07-01 LAB — HEPARIN LEVEL (UNFRACTIONATED): Heparin Unfractionated: 0.35 [IU]/mL (ref 0.30–0.70)

## 2024-07-01 MED ORDER — PIPERACILLIN-TAZOBACTAM 3.375 G IVPB
3.3750 g | Freq: Three times a day (TID) | INTRAVENOUS | Status: DC
  Administered 2024-07-01 – 2024-07-02 (×3): 3.375 g via INTRAVENOUS
  Filled 2024-07-01 (×5): qty 50

## 2024-07-01 MED ORDER — HEPARIN (PORCINE) 25000 UT/250ML-% IV SOLN
1000.0000 [IU]/h | INTRAVENOUS | Status: DC
  Administered 2024-07-01: 900 [IU]/h via INTRAVENOUS
  Filled 2024-07-01: qty 250

## 2024-07-01 NOTE — Plan of Care (Signed)
" °  Problem: Education: Goal: Knowledge of General Education information will improve Description: Including pain rating scale, medication(s)/side effects and non-pharmacologic comfort measures Outcome: Progressing   Problem: Health Behavior/Discharge Planning: Goal: Ability to manage health-related needs will improve Outcome: Progressing   Problem: Clinical Measurements: Goal: Ability to maintain clinical measurements within normal limits will improve Outcome: Progressing   Problem: Clinical Measurements: Goal: Will remain free from infection Outcome: Progressing   Problem: Clinical Measurements: Goal: Diagnostic test results will improve Outcome: Progressing   Problem: Clinical Measurements: Goal: Respiratory complications will improve Outcome: Progressing   Problem: Elimination: Goal: Will not experience complications related to bowel motility Outcome: Progressing   Problem: Safety: Goal: Ability to remain free from injury will improve Outcome: Progressing   Problem: Skin Integrity: Goal: Risk for impaired skin integrity will decrease Outcome: Progressing   "

## 2024-07-01 NOTE — Progress Notes (Signed)
 " PROGRESS NOTE    Lauren Clark  FMW:[id number] DOB: 03-23-1956 DOA: 06/26/2024 PCP: [name], MD  Subjective: No acute events overnight. Seen and examined at bedside. Denies any chest pain, palpitations or dizziness. Tolerating oral intake without n/v. Denies constipation.   Hospital Course:  As per H&P: 68 y.o. female with medical history significant of COPD, chronic hypoxemic respiratory failure on 2 L home oxygen, CAD, A-fib on Eliquis , HFpEF, ascending aortic aneurysm followed by cardiothoracic surgery, hypertension, hyperlipidemia, hypothyroidism, CKD stage IIIa, anemia, anxiety, depression, GERD, glaucoma, vision impairment, morbid obesity status post gastric sleeve surgery, PVD.  Recent hospital admission 12/13-12/24 for AKI, pyelonephritis, ileus, mildly elevated LFTs, and acute on chronic hypoxemic respiratory failure secondary to CHF exacerbation. Creatinine had peaked to 3.86.  Her diuretics were held and she was given IV albumin .  Creatinine improved to 2.97 at the time of discharge yesterday and nephrology resumed Lasix ..  Completed 7 days of Rocephin  for pyelonephritis.  She was evaluated by physical therapy and discharged with home health PT/OT yesterday afternoon.  Patient returned to the ED via EMS in the evening due to concern for shortness of breath, fall at home, and possible syncope.  She was saturating in the 80s on her home 2 L [place] with EMS.  She was wheezing on exam and given given albuterol  treatment by EMS.  Patient noted to have bruising on her left foot and complained of knee pain as well.  In the ED, patient was initially saturating well on 4.5 L [place] but after RN laid her down in the bed to place bedpan, patient desaturated to the mid 80s and oxygen saturation did not improve with nonrebreather.  She became tachypneic to the 30s and was subsequently placed on BiPAP with improvement of work of breathing and improvement of oxygen saturation to the 90s.  Afebrile.  Labs  showing worsening leukocytosis (WBC count now 35.1), sodium 131, bicarb 17, anion gap 16, glucose 160, BUN 56, creatinine 3.0, AST 586, ALT 276, alk phos 203, T. bili 1.2, lactic acid 4.1>3.3, CK 385, proBNP 8463, influenza A PCR positive, no blood cultures drawn.  UA showing large amount of leukocytes, 21-50 RBCs, 21-50 WBCs, and many bacteria EKG showing sinus or ectopic atrial rhythm with borderline PR prolongation, QTc 531, and no acute ischemic changes.  Troponin 157.  X-rays of pelvis, bilateral knees, and left foot negative for fractures.  Chest x-ray showing no acute findings or evidence of traumatic injury.  CT head/C-spine negative for acute findings.  Right upper quadrant abdominal ultrasound showing gallstones without sonographic findings of acute cholecystitis.  Showing increased hepatic parenchymal echogenicity typical of steatosis.  Patient was given Solu-Medrol  125 mg, vancomycin , Zosyn , IV mag 2 g, and continuous albuterol  neb treatment in the ED.   Assessment and Plan:  Acute on chronic hypoxemic respiratory failure Patient was noted to be saturating in the 80s on her home 2 L [place] with EMS.  She was wheezing with EMS and was given albuterol  treatment. - currently on 4L < BiPAP, on 2L via [place] at home - etiology unclear, likely multifactorial in the setting of acute COPD exacerbation, acute influenza A infection with superimposed bacterial PNA, acute CHF exacerbation - less likely acute PE as already on anticoagulation  - Blood gas without evidence of hypercapnia.   - see management of active co-morbidities as elsewhere - wean O2 as tolerated    Possible acute on chronic HFpEF Hypotension - clinically euvolemic now - CXR with  no any acute findings but stable cardiomegaly - proBNP 32843 < 8463 - TTE showed apical akinesis concerning for stress induced cardiomyopathy but cannot exclude LAD distribution ischemia, LVEF 30-35%, RV size/systolic function normal, IVC normal size with <50%  variability suggesting RAP - given lasix  40mg  IV once in the ED - one dose IV lasix  80mg  12/26 by cardio - seen by PCCM. Patient wants to avoid procedures including central line placement and wants to try and avoid ICU level of care.  - cont external foley for accurate I/Os - daily weights - monitor on tele - cardiology following - palliative following   Sepsis Acute influenza A infection Possible bacterial PNA Acute complicated UTI - Recently finished 7-day course of Rocephin  for pyelonephritis. Prior Ucx with polymicrobial growth at that time - UA continues to show evidence of pyuria and bacteria.  - Influenza A PCR positive.  - MRSA negative - procalcitonin 8 < 16 - CXR with no any acute findings but stable cardiomegaly - CT chest w/o contrast showed interval development of patchy ground-glass opacity with interlobular septal thickening in both upper lobes, L > R. Confluent areas of consolidative airspace disease are seen in a patchy distribution of the mid and lower lungs bilaterally. Imaging features are compatible with multifocal pneumonia. - CT abdomen pelvis w/o contrast showed mild L hydroureteronephrosis without stone, high density material at upper pole calyx in L kidney that maybe blood product/infectious lesion/neoplasm, lamellated stone with internal gas in urinary bladder, cholelithiasis  - status post ureterocystoscopy w/ L retrograde pyelogram and Cystolitholapaxy 12/28 concerning for possible L renal and urinary bladder amorphous masses - UCx with ampicillin-sensitive E. Faecalis - BCx with NGTD, final read pending  - renal and bladder mass biopsies with NGTD but final read pending, pathology pending - cont oseltamivir , plan for 10 day course to end 07/07/24 - stopped vancomycin  - cont zosyn , duration TBD - started on micafungin , plan for 14 day course - ID following  - urology following - palliative following   Paroxysmal Atrial fibrillation - RVR noted  overnight 12/26 - cont on amiodarone  drip - status post heparin  drip for ACS coverage (~48 hours). - resume heparin  drip as per cardio recs - will need to resume eliquis  when ok by urology team (post procedure)   Acute COPD exacerbation - status post albuterol  nebs, Solu-Medrol  125 mg, and IV mag 2 g in the ED.  - Cont prednisone , plan for 5 days total steroid course to finish 12/29 - cont duonebs ATC - cont albuterol  PRN   Elevated troponin NSTEMI History of CAD - EKG without acute ischemic changes - Trop 69 < 77, peaked at 365 - TTE with apical akinesis - hold home eliquis   - status post heparin  drip for ACS coverage (~48 hours). - likely no plan for cardiac catheterization, need to confirm with cardiology team - hold statin given acute liver failiure - hold metoprolol  given hypotension - monitor on tele - cardiology following   Acute liver failure  - improving - Patient is reporting history of postprandial abdominal pain for several months - Transaminases and alkaline phosphatase worse compared to labs done during recent hospitalization. AST 586, ALT 276, alk phos 203.   - etiology unclear, likely shock liver in the setting of hypotension due to possible shock state (septic vs cardiogenic) - Right upper quadrant abdominal ultrasound showed gallstones without sonographic findings of acute cholecystitis.  Showing increased hepatic parenchymal echogenicity typical of steatosis.   - Acute hepatitis panel negative  -  acetaminophen  level low  - hold home statin therapy - cont NAC protocol - monitor LFTs - She will need outpatient evaluation by general surgery for consideration of cholecystectomy after her acute pulmonary and cardiac issues resolve.    AKI on CKD stage IIIa High anion gap metabolic acidosis - Creatinine 2.9 < 3.02, peaked at 3.68 this admission, 2.97 on discharge 06/26/24, baseline before last admission was around 1.2.  - concern for fluid overload state - CT  A/P findings as elsewhere - status post lasix  IV dose in the ED - occasional diuresis as elsewhere - decrease sodium bicarbonate  650 to 325mg  BID (goal serum bicarb > 22) - avoid nephrotoxic agents - renal dose medications - monitor I/Os, renal function  - discussed with nephrology on 12/24 who has stated they will continue to follow - urology following   Lactic acidosis - likely in the setting of hypotension due to possible shock state (septic vs cardiogenic), NSTEMI and acute liver failure - resolved now - monitor clinically   QT prolongation QTc 531 on EKG.   - amiodarone  as elsewhere - Monitor potassium and magnesium  levels.  -  Avoid QT prolonging drugs if possible.  -  Follow-up repeat EKG.   Hyperlipidemia - hold statin therapy given acute liver failure   Mild chronic hyponatremia - resolved - asymptomatic - likely due to hypervolemia.  - status post intermittent IV Lasix  doses - monitor BMP   Hypothyroidism - cont levothyroxine    Anxiety and depression - cont home fluoxetine    Back pain Dengenerative disc disease Mechanical Fall - pain seems chronic in nature - No traumatic injuries identified on imaging of chest, pelvis, bilateral knees, left foot, head, and C-spine.  Fall precautions.  - Xray T- and L-spine showed diffuse degenerative disc disease - cannot give acetaminophen  given liver failure - want to avoid systemic NSAIDs given AKI on CKD - cont voltaren  gel - cont lidocaine  patch - can consider one time doses of opioids if pain unbearable - PT/OT following  DVT prophylaxis: SCDs Start: 06/29/24 1408  IV Heparin    Code Status: Limited: Do not attempt resuscitation (DNR) -DNR-LIMITED -Do Not Intubate/DNI   Disposition Plan: SNF Reason for continuing need for hospitalization: severity of illness  Objective: Vitals:   07/01/24 0412 07/01/24 0828 07/01/24 0904 07/01/24 1233  BP: 114/82 105/78  100/72  Pulse: (!) 123 64  95  Resp: (!) 23 20   (!) 22  Temp: 98.1 F (36.7 C) 97.6 F (36.4 C)  98.1 F (36.7 C)  TempSrc: Oral Oral  Oral  SpO2: 97% 92%  91%  Weight:   107.6 kg   Height:        Intake/Output Summary (Last 24 hours) at 07/01/2024 1441 Last data filed at 07/01/2024 0820 Gross per 24 hour  Intake 1526.16 ml  Output 700 ml  Net 826.16 ml   Filed Weights   06/26/24 2317 07/01/24 0904  Weight: 91.6 kg 107.6 kg    Examination:  Physical Exam Vitals and nursing note reviewed.  Constitutional:      General: She is not in acute distress.    Appearance: She is obese. She is ill-appearing.     Comments: Weak, frail  HENT:     Head: Normocephalic and atraumatic.  Cardiovascular:     Rate and Rhythm: Tachycardia present. Rhythm irregular.     Pulses: Normal pulses.     Heart sounds: Normal heart sounds.  Pulmonary:     Effort: Pulmonary effort is normal. No  respiratory distress.     Breath sounds: Normal breath sounds. No wheezing.  Abdominal:     General: Bowel sounds are normal.     Palpations: Abdomen is soft.  Neurological:     Mental Status: She is alert. Mental status is at baseline.     Data Reviewed: I have personally reviewed following labs and imaging studies  CBC: Recent Labs  Lab 06/25/24 0209 06/26/24 2323 06/28/24 0531 06/28/24 1932 06/29/24 0554 06/30/24 0555 06/30/24 1523  WBC 18.9*   < > 30.4* 31.2* 25.2* 16.8* 39.0*  40.5*  NEUTROABS 15.7*  --  28.8*  --  23.3* 14.9* 35.9*  HGB 11.2*   < > 10.5* 10.9* 11.0* 11.0* 10.2*  10.1*  HCT 34.4*   < > 30.8* 33.2* 32.6* 34.2* 32.2*  31.5*  MCV 90.8   < > 90.1 92.0 89.6 91.7 94.4  92.9  PLT 248   < > 260 310 304 275 235  247   < > = values in this interval not displayed.   Basic Metabolic Panel: Recent Labs  Lab 06/27/24 0705 06/27/24 1138 06/27/24 1514 06/28/24 0531 06/29/24 0554 06/30/24 0555 06/30/24 1144 06/30/24 1523 07/01/24 0500  NA 132*  --    < > 137 139 142  --  139 140  K 4.7  --    < > 4.1 3.7 3.2*  --   3.7 4.1  CL 98  --    < > 96* 95* 98  --  95* 99  CO2 19*  --    < > 17* 19* 26  --  21* 25  GLUCOSE 170*  --    < > 226* 248* 136*  --  255* 147*  BUN 59*  --    < > 69* 75* 79*  --  77* 83*  CREATININE 3.36*  --    < > 3.68* 3.40* 2.89*  --  3.02* 2.90*  CALCIUM  8.7*  --    < > 8.4* 8.4* 9.1  --  8.9 9.2  MG 3.2* 3.1*  --   --   --   --  2.0  --   --   PHOS  --  5.7*  --   --   --   --   --   --   --    < > = values in this interval not displayed.   GFR: Estimated Creatinine Clearance: 22.3 mL/min (A) (by C-G formula based on SCr of 2.9 mg/dL (H)). Liver Function Tests: Recent Labs  Lab 06/27/24 1514 06/28/24 0531 06/29/24 0554 06/30/24 0555 07/01/24 0500  AST 6,589* 4,736* 1,888* 755* 410*  ALT 2,684* 2,637* 2,069* 1,438* 1,037*  ALKPHOS 189* 184* 249* 208* 196*  BILITOT 1.0 1.0 1.1 0.9 0.8  PROT 6.0* 5.7* 5.9* 5.8* 5.9*  ALBUMIN  3.0* 2.9* 2.8* 2.7* 2.9*   No results for input(s): LIPASE, AMYLASE in the last 168 hours. No results for input(s): AMMONIA in the last 168 hours. Coagulation Profile: Recent Labs  Lab 06/26/24 2323  INR 2.4*   Cardiac Enzymes: Recent Labs  Lab 06/27/24 0002 06/27/24 0705  CKTOTAL 385* 894*   ProBNP, BNP (last 5 results) Recent Labs    01/01/24 0851 04/15/24 1032 06/17/24 0135 06/27/24 0002 06/27/24 0705  PROBNP  --   --   --  8,463.0* 32,843.0*  BNP 311.3* 179.8* 567.8*  --   --    HbA1C: No results for input(s): HGBA1C in the last 72 hours. CBG: Recent Labs  Lab 06/26/24 0425  GLUCAP 97   Lipid Profile: No results for input(s): CHOL, HDL, LDLCALC, TRIG, CHOLHDL, LDLDIRECT in the last 72 hours. Thyroid  Function Tests: No results for input(s): TSH, T4TOTAL, FREET4, T3FREE, THYROIDAB in the last 72 hours. Anemia Panel: No results for input(s): VITAMINB12, FOLATE, FERRITIN, TIBC, IRON, RETICCTPCT in the last 72 hours. Sepsis Labs: Recent Labs  Lab 06/27/24 1138 06/27/24 1514  06/29/24 0500 06/30/24 1201 06/30/24 1411 07/01/24 0500  PROCALCITON 16.00  --  8.49  --   --   --   LATICACIDVEN  --  1.7  --  2.2* 2.8* 1.2    Recent Results (from the past 240 hours)  Resp panel by RT-PCR (RSV, Flu A&B, Covid) Anterior Nasal Swab     Status: Abnormal   Collection Time: 06/27/24 12:05 AM   Specimen: Anterior Nasal Swab  Result Value Ref Range Status   SARS Coronavirus 2 by RT PCR NEGATIVE NEGATIVE Final   Influenza A by PCR POSITIVE (A) NEGATIVE Final   Influenza B by PCR NEGATIVE NEGATIVE Final    Comment: (NOTE) The Xpert Xpress SARS-CoV-2/FLU/RSV plus assay is intended as an aid in the diagnosis of influenza from Nasopharyngeal swab specimens and should not be used as a sole basis for treatment. Nasal washings and aspirates are unacceptable for Xpert Xpress SARS-CoV-2/FLU/RSV testing.  Fact Sheet for Patients: bloggercourse.com  Fact Sheet for Healthcare Providers: seriousbroker.it  This test is not yet approved or cleared by the [place]  FDA and has been authorized for detection and/or diagnosis of SARS-CoV-2 by FDA under an Emergency Use Authorization (EUA). This EUA will remain in effect (meaning this test can be used) for the duration of the COVID-19 declaration under Section 564(b)(1) of the Act, 21 U.S.C. section 360bbb-3(b)(1), unless the authorization is terminated or revoked.     Resp Syncytial Virus by PCR NEGATIVE NEGATIVE Final    Comment: (NOTE) Fact Sheet for Patients: bloggercourse.com  Fact Sheet for Healthcare Providers: seriousbroker.it  This test is not yet approved or cleared by the [place]  FDA and has been authorized for detection and/or diagnosis of SARS-CoV-2 by FDA under an Emergency Use Authorization (EUA). This EUA will remain in effect (meaning this test can be used) for the duration of the COVID-19 declaration  under Section 564(b)(1) of the Act, 21 U.S.C. section 360bbb-3(b)(1), unless the authorization is terminated or revoked.  Performed at [facility], [address], [place], [place] [address]   Urine Culture     Status: Abnormal   Collection Time: 06/27/24  6:20 AM   Specimen: Urine, Clean Catch  Result Value Ref Range Status   Specimen Description URINE, CLEAN CATCH  Final   Special Requests   Final    NONE Performed at [facility], [address] [place], [place] [address]    Culture >=100,000 COLONIES/mL ENTEROCOCCUS FAECALIS (A)  Final   Report Status 07/01/2024 FINAL  Final   Organism ID, Bacteria ENTEROCOCCUS FAECALIS (A)  Final      Susceptibility   Enterococcus faecalis - MIC*    AMPICILLIN <=2 SENSITIVE Sensitive     NITROFURANTOIN <=16 SENSITIVE Sensitive     VANCOMYCIN  1 SENSITIVE Sensitive     * >=100,000 COLONIES/mL ENTEROCOCCUS FAECALIS  Culture, blood (Routine X 2) w Reflex to ID Panel     Status: None (Preliminary result)   Collection Time: 06/27/24  6:57 AM   Specimen: BLOOD LEFT HAND  Result Value Ref Range Status  Specimen Description BLOOD LEFT HAND  Final   Special Requests   Final    BOTTLES DRAWN AEROBIC ONLY Blood Culture results may not be optimal due to an inadequate volume of blood received in culture bottles   Culture   Final    NO GROWTH 4 DAYS Performed at [facility], [address], [place], [place] [address]    Report Status PENDING  Incomplete  Culture, blood (Routine X 2) w Reflex to ID Panel     Status: None (Preliminary result)   Collection Time: 06/27/24  7:45 AM   Specimen: BLOOD  Result Value Ref Range Status   Specimen Description BLOOD RIGHT ANTECUBITAL  Final   Special Requests   Final    BOTTLES DRAWN AEROBIC AND ANAEROBIC Blood Culture adequate volume   Culture   Final    NO GROWTH 4 DAYS Performed at [facility], [address] [place], [place] [address]    Report Status PENDING  Incomplete   MRSA Next Gen by PCR, Nasal     Status: None   Collection Time: 06/27/24 11:38 AM   Specimen: Nasal Mucosa; Nasal Swab  Result Value Ref Range Status   MRSA by PCR Next Gen NOT DETECTED NOT DETECTED Final    Comment: (NOTE) The GeneXpert MRSA Assay (FDA approved for NASAL specimens only), is one component of a comprehensive MRSA colonization surveillance program. It is not intended to diagnose MRSA infection nor to guide or monitor treatment for MRSA infections. Test performance is not FDA approved in patients less than 28 years old. Performed at [facility], [address] [place], [place] [address]   Aerobic/Anaerobic Culture w Gram Stain (surgical/deep wound)     Status: None (Preliminary result)   Collection Time: 06/30/24 10:34 AM   Specimen: Path fluid; Body Fluid  Result Value Ref Range Status   Specimen Description FLUID LEFT KIDNEY  Final   Special Requests NONE  Final   Gram Stain RARE WBC SEEN NO ORGANISMS SEEN   Final   Culture   Final    NO GROWTH 1 DAY Performed at [facility], [address], [place], [place] [address]    Report Status PENDING  Incomplete     Radiology Studies: DG C-Arm 1-60 Min Result Date: 06/30/2024 CLINICAL DATA:  Cystoscopy.  Left ureteral stent placement. EXAM: DG C-ARM 1-60 MIN CONTRAST:  Not provided. FLUOROSCOPY: Fluoroscopy Time:  1 minute 41 seconds Radiation Exposure Index (if provided by the fluoroscopic device): 48.17 mGy Number of Acquired Spot Images: 5 COMPARISON:  CT 06/28/2024 FINDINGS: Five fluoroscopic spot views submitted from the operating/procedure room. Contrast opacifies the renal collecting system with placement of ureteral stent. IMPRESSION: Procedural fluoroscopy during cystoscopy. Please reference procedure report for details. Electronically Signed   By: Andrea Gasman M.D.   On: 06/30/2024 17:17   DG C-Arm 1-60 Min-No Report Result Date: 06/30/2024 Fluoroscopy was utilized by the requesting  physician.  No radiographic interpretation.   DG C-Arm 1-60 Min-No Report Result Date: 06/30/2024 Fluoroscopy was utilized by the requesting physician.  No radiographic interpretation.    Scheduled Meds:  brimonidine   1 drop Right Eye BID   And   timolol   1 drop Right Eye BID   Chlorhexidine  Gluconate Cloth  6 each Topical Daily   diclofenac  Sodium  2 g Topical TID AC & HS   FLUoxetine   40 mg Oral Daily   guaiFENesin   600 mg Oral BID  latanoprost   1 drop Right Eye QHS   levothyroxine   112 mcg Oral q morning   lidocaine   1 patch Transdermal QHS   oseltamivir   30 mg Oral Daily   sodium bicarbonate   325 mg Oral BID   sodium chloride  flush  10-40 mL Intracatheter Q12H   Continuous Infusions:  amiodarone  30 mg/hr (07/01/24 0030)   heparin  900 Units/hr (07/01/24 1000)   micafungin  (MYCAMINE ) 100 mg in sodium chloride  0.9 % 100 mL IVPB 100 mg (07/01/24 0956)   piperacillin -tazobactam (ZOSYN )  IV       LOS: 4 days   Norval Bar, MD  [facility]  07/01/2024, 2:41 PM   "

## 2024-07-01 NOTE — Progress Notes (Signed)
 Physical Therapy Treatment Patient Details Name: Lauren Clark MRN: [id number] DOB: 1955-10-04 Today's Date: 07/01/2024   History of Present Illness 68 y.o. female presents to [facility] 06/26/24 on same day as d/c from hospital due to worsening SOB and fall. Trauma scans negative. Admitted with acute COPD exacerbation 2/2 influenza A and acute on chronic hypoxemic respiratory failure. Also with acute liver failure. PMH anemia, anxiety, aortic dissection COPD, arthritis, depression, glaucoma, blind L eye, limited vision r eye, obese, PVD, gastric sleeve, R THA, HTN, HLD, CKD III,CAD, afib on eliquis , HFpEF, aortic aneurysm.    PT Comments  Patient resting in bed upon PT arrival, friend at bedside in preparation to meet with member of medical team. Pt eager to sit up and attempt standing as she hasn't been able to since fall and new L ankle pain (no fx on xray). Able to complete x2 stands at EOB with MIN-MOD A and heavy UE support on RW. Demonstrates significant forward flexed posture with inability to correct despite assistance. Pt reports using a rollator for mobility at home and is more comfortable with that device compared to RW;however given her heavy reliance on device, balance deficits and limited mobility, PT recommends continued use of RW. Patient will continue to benefit from skilled therapy while in acute, and follow up with SNF.   If plan is discharge home, recommend the following: A lot of help with walking and/or transfers;A lot of help with bathing/dressing/bathroom;Assistance with cooking/housework;Assist for transportation;Help with stairs or ramp for entrance   Can travel by private vehicle     No  Equipment Recommendations  Wheelchair (measurements PT);Wheelchair cushion (measurements PT);BSC/3in1    Recommendations for Other Services       Precautions / Restrictions Precautions Precautions: Fall;Other (comment) Recall of Precautions/Restrictions: Intact Precaution/Restrictions  Comments: watch O2 sats; O2 sensor was on ear and not working well, check L ring finger Restrictions Weight Bearing Restrictions Per Provider Order: No     Mobility  Bed Mobility Overal bed mobility: Needs Assistance Bed Mobility: Supine to Sit, Sit to Supine     Supine to sit: HOB elevated, Used rails, Min assist Sit to supine: Min assist, Used rails, HOB elevated   General bed mobility comments: increased time to complete, however less physical assistance needed to elevate trunk from bed surface; MIN A to bring BLE back into bed at end of session.    Transfers Overall transfer level: Needs assistance Equipment used: Rolling walker (2 wheels) Transfers: Sit to/from Stand Sit to Stand: Min assist, Mod assist           General transfer comment: x2 sit<>stand from EOB with RW, MIN-MOD A to assist with power to stand secondary to pain with weightbeairng on L foot. Demos significant forward flexed posture over RW, unable to correct with assistance. Tolerates standing for 3-5sec each.    Ambulation/Gait Ambulation/Gait assistance:  (deferred secondary to continued pain in standing with L foot, and HR elevated with activity)                 Stairs             Wheelchair Mobility     Tilt Bed    Modified Rankin (Stroke Patients Only)       Balance Overall balance assessment: Needs assistance Sitting-balance support: Feet supported, Single extremity supported Sitting balance-Leahy Scale: Fair     Standing balance support: During functional activity, Reliant on assistive device for balance Standing balance-Leahy Scale: Poor Standing balance comment: unable  to stand without UE support, heavy forward flexed posture over RW unable to correct; increased right lateral weight shift due to pain with weight bearing on L foot                            Communication Communication Communication: No apparent difficulties  Cognition Arousal:  Alert Behavior During Therapy: WFL for tasks assessed/performed   PT - Cognitive impairments: No apparent impairments                         Following commands: Intact      Cueing Cueing Techniques: Verbal cues  Exercises      General Comments General comments (skin integrity, edema, etc.): on 3L spO2 >90% during session. HR elevated 103-124 with mobility. RR 20-28; increased time and effort required for all mobility tasks due to decreased endurance, activity tolerance and pain      Pertinent Vitals/Pain Pain Assessment Pain Assessment: Faces Faces Pain Scale: Hurts little more Pain Location: dorsum of L foot and ankle Pain Descriptors / Indicators: Discomfort, Grimacing Pain Intervention(s): Monitored during session, Limited activity within patient's tolerance, Repositioned, Premedicated before session    Home Living                          Prior Function            PT Goals (current goals can now be found in the care plan section) Acute Rehab PT Goals Patient Stated Goal: to get more rehab PT Goal Formulation: With patient Time For Goal Achievement: 07/12/24 Potential to Achieve Goals: Good Progress towards PT goals: Progressing toward goals    Frequency    Min 2X/week      PT Plan      Co-evaluation              AM-PAC PT 6 Clicks Mobility   Outcome Measure  Help needed turning from your back to your side while in a flat bed without using bedrails?: A Little Help needed moving from lying on your back to sitting on the side of a flat bed without using bedrails?: A Lot Help needed moving to and from a bed to a chair (including a wheelchair)?: A Lot Help needed standing up from a chair using your arms (e.g., wheelchair or bedside chair)?: A Little Help needed to walk in hospital room?: Total Help needed climbing 3-5 steps with a railing? : Total 6 Click Score: 12    End of Session Equipment Utilized During Treatment:  Oxygen;Gait belt Activity Tolerance: Patient tolerated treatment well;Patient limited by pain;Patient limited by fatigue Patient left: in bed;with call bell/phone within reach;with bed alarm set;with family/visitor present Nurse Communication: Mobility status (vitals, IV lines) PT Visit Diagnosis: Unsteadiness on feet (R26.81);Other abnormalities of gait and mobility (R26.89);Muscle weakness (generalized) (M62.81)     Time: 8699-8670 PT Time Calculation (min) (ACUTE ONLY): 29 min  Charges:    $Therapeutic Activity: 23-37 mins                       Gearld Kerstein H. Nadiah Corbit, PT, DPT   [employer] 07/01/2024, 2:35 PM

## 2024-07-01 NOTE — Progress Notes (Signed)
 PHARMACY - ANTICOAGULATION CONSULT NOTE  Pharmacy Consult for IV heparin  Indication: A-fib RVR  Allergies[1]  Patient Measurements: Height: 5' 5 (165.1 cm) Weight: 107.6 kg (237 lb 3.4 oz) IBW/kg (Calculated) : 57 HEPARIN  DW (KG): 77.4  Vital Signs: Temp: 98.1 F (36.7 C) (12/29 1640) Temp Source: Oral (12/29 1640) BP: 162/81 (12/29 1640) Pulse Rate: 106 (12/29 1640)  Labs: Recent Labs    06/28/24 1932 06/28/24 2130 06/29/24 0554 06/29/24 0555 06/30/24 0555 06/30/24 1523 07/01/24 0500 07/01/24 1431 07/01/24 1845  HGB  --   --  11.0*  --  11.0* 10.2*  10.1*  --  10.2*  --   HCT  --   --  32.6*  --  34.2* 32.2*  31.5*  --  32.0*  --   PLT  --   --  304  --  275 235  247  --  203  --   APTT  --  83* 78*  --   --   --   --   --  43*  HEPARINUNFRC  --  >1.10*  --  >1.10*  --   --   --   --   --   CREATININE   < >  --  3.40*  --  2.89* 3.02* 2.90*  --   --    < > = values in this interval not displayed.    Estimated Creatinine Clearance: 22.3 mL/min (A) (by C-G formula based on SCr of 2.9 mg/dL (H)).   Medical History: Past Medical History:  Diagnosis Date   Anemia    s/p knee surgery   Anxiety    Aortic dissection (HCC)    Arthritis    COPD (chronic obstructive pulmonary disease) (HCC) 06/15/2024   Depression    GERD (gastroesophageal reflux disease)    Glaucoma    right eye uses drops   History of blood transfusion    [facility]   History of MRSA infection    Hyperlipidemia    Hypertension    Hypothyroidism    Impaired vision in both eyes    blind left eye, limited vision right eye   Morbid obesity (HCC)    s/p Gastric sleeve- 8'17(loss thus far approx. 100 lbs)   Peripheral vascular disease    dvt right leg   Pre-diabetes    prior to gastric sleeve    Medications:  Scheduled:   brimonidine   1 drop Right Eye BID   And   timolol   1 drop Right Eye BID   Chlorhexidine  Gluconate Cloth  6 each Topical Daily    diclofenac  Sodium  2 g Topical TID AC & HS   FLUoxetine   40 mg Oral Daily   guaiFENesin   600 mg Oral BID   latanoprost   1 drop Right Eye QHS   levothyroxine   112 mcg Oral q morning   lidocaine   1 patch Transdermal QHS   oseltamivir   30 mg Oral Daily   sodium bicarbonate   325 mg Oral BID   sodium chloride  flush  10-40 mL Intracatheter Q12H    Assessment: [name] is a 52 YOF admitted with concern for ACS and persistent A-fib with RVR. Rate control limited by hypotension. IV heparin  was stopped prior to cystoscopy on 06/29/24. Patient now s/p cystoscopy. Per cardiology, plan to restart heparin  while patients remains in A-fib RVR. Will delay restarting PTA Eliquis  until cleared by urology.    07/01/24: Hgb 10.1, PLT 247. Heparin  anti-Xa level still falsely  elevated when last checked on 06/29/24. Will monitor both aPTT and heparin  anti-Xa levels until correlating. Heparin  previously therapeutic on 900 units/hr, will restart at this rate. No bleeding issues noted at this time per RN.  07/01/24 PM update: aPTT 43 sec is subtherapeutic on 900 units/hr. Per RN, no issues with the heparin  infusion (no pauses/interruptions) or bleeding reported.  Goal of Therapy:  Heparin  level 0.3-0.7 units/ml aPTT 66-102 seconds Monitor platelets by anticoagulation protocol: Yes   Plan:  Increase heparin  to 1000 units/hr Monitor heparin  level and aPTT in 8 hours and daily until correlating Monitor CBC and s/sx of bleeding daily F/u urology clearance to restart Eliquis   Lauren Clark, PharmD, BCPS 07/01/2024 7:24 PM  Please check AMION for all [facility] Pharmacy phone numbers After 10:00 PM, call Main Pharmacy [phone]       [1]  Allergies Allergen Reactions   Crestor  [Rosuvastatin  Calcium ] Other (See Comments)    Muscle aches     Hctz [Hydrochlorothiazide] Other (See Comments)    Pt doesn't remember   Lipitor [Atorvastatin] Other (See Comments)    Muscle aches

## 2024-07-01 NOTE — Progress Notes (Signed)
 PHARMACY NOTE:  ANTIMICROBIAL RENAL DOSAGE ADJUSTMENT  Current antimicrobial regimen includes a mismatch between antimicrobial dosage and estimated renal function.  As per policy approved by the Pharmacy & Therapeutics and Medical Executive Committees, the antimicrobial dosage will be adjusted accordingly.  Current antimicrobial dosage:  Zosyn  2.25 gm IV q8h  Indication: sepsis/possible bacterial pneumonia + complicated UTI  Renal Function:  Estimated Creatinine Clearance: 22.3 mL/min (A) (by C-G formula based on SCr of 2.9 mg/dL (H)). []      On intermittent HD, scheduled: []      On CRRT    Antimicrobial dosage has been changed to:  Zosyn  3.375 gm IV q8h  Thank you for allowing pharmacy to be a part of this patient's care.  Morna Breach, PharmD, BCPS PGY2 Cardiology Pharmacy Resident 07/01/2024 10:22 AM

## 2024-07-01 NOTE — Progress Notes (Addendum)
 PHARMACY - ANTICOAGULATION CONSULT NOTE  Pharmacy Consult for IV heparin  Indication: A-fib RVR  Allergies[1]  Patient Measurements: Height: 5' 5 (165.1 cm) Weight: 91.6 kg (202 lb) IBW/kg (Calculated) : 57 HEPARIN  DW (KG): 77.4  Vital Signs: Temp: 97.6 F (36.4 C) (12/29 0828) Temp Source: Oral (12/29 0828) BP: 105/78 (12/29 0828) Pulse Rate: 64 (12/29 0828)  Labs: Recent Labs    06/28/24 1237 06/28/24 1932 06/28/24 2130 06/29/24 0554 06/29/24 0555 06/30/24 0555 06/30/24 1523 07/01/24 0500  HGB  --    < >  --  11.0*  --  11.0* 10.2*  10.1*  --   HCT  --    < >  --  32.6*  --  34.2* 32.2*  31.5*  --   PLT  --    < >  --  304  --  275 235  247  --   APTT  --   --  83* 78*  --   --   --   --   HEPARINUNFRC >1.10*  --  >1.10*  --  >1.10*  --   --   --   CREATININE  --    < >  --  3.40*  --  2.89* 3.02* 2.90*   < > = values in this interval not displayed.    Estimated Creatinine Clearance: 20.5 mL/min (A) (by C-G formula based on SCr of 2.9 mg/dL (H)).   Medical History: Past Medical History:  Diagnosis Date   Anemia    s/p knee surgery   Anxiety    Aortic dissection (HCC)    Arthritis    COPD (chronic obstructive pulmonary disease) (HCC) 06/15/2024   Depression    GERD (gastroesophageal reflux disease)    Glaucoma    right eye uses drops   [facility] of blood transfusion    [facility]   History of MRSA infection    Hyperlipidemia    Hypertension    Hypothyroidism    Impaired vision in both eyes    blind left eye, limited vision right eye   Morbid obesity (HCC)    s/p Gastric sleeve- 8'17(loss thus far approx. 100 lbs)   Peripheral vascular disease    dvt right leg   Pre-diabetes    prior to gastric sleeve    Medications:  Scheduled:   brimonidine   1 drop Right Eye BID   And   timolol   1 drop Right Eye BID   Chlorhexidine  Gluconate Cloth  6 each Topical Daily   diclofenac  Sodium  2 g Topical TID AC & HS    FLUoxetine   40 mg Oral Daily   guaiFENesin   600 mg Oral BID   latanoprost   1 drop Right Eye QHS   levothyroxine   112 mcg Oral q morning   lidocaine   1 patch Transdermal QHS   oseltamivir   30 mg Oral Daily   sodium bicarbonate   325 mg Oral BID   sodium chloride  flush  10-40 mL Intracatheter Q12H    Assessment: Ms. Labo is a 58 YOF admitted with concern for ACS and persistent A-fib with RVR. Rate control limited by hypotension. IV heparin  was stopped prior to cystoscopy on 06/29/24. Patient now s/p cystoscopy. Per cardiology, plan to restart heparin  while patients remains in A-fib RVR. Will delay restarting PTA Eliquis  until cleared by urology.    07/01/24: Hgb 10.1, PLT 247. Heparin  anti-Xa level still falsely elevated when last checked on 06/29/24. Will monitor both aPTT and heparin  anti-Xa  levels until correlating. Heparin  previously therapeutic on 900 units/hr, will restart at this rate. No bleeding issues noted at this time per RN.  Goal of Therapy:  Heparin  level 0.3-0.7 units/ml aPTT 66-102 seconds Monitor platelets by anticoagulation protocol: Yes   Plan:  Restart heparin  900 units/hr Monitor heparin  level and aPTT in 8 hours and daily until correlating Monitor CBC and s/sx of bleeding daily F/u urology clearance to restart Eliquis   Morna Breach, PharmD, BCPS PGY2 Cardiology Pharmacy Resident 07/01/2024 9:18 AM     [1]  Allergies Allergen Reactions   Crestor  [Rosuvastatin  Calcium ] Other (See Comments)    Muscle aches     Hctz [Hydrochlorothiazide] Other (See Comments)    Pt doesn't remember   Lipitor [Atorvastatin] Other (See Comments)    Muscle aches

## 2024-07-01 NOTE — Care Management Important Message (Signed)
 Important Message  Patient Details  Name: Lauren Clark MRN: [id number] Date of Birth: 17-Aug-1955   Important Message Given:  Yes - Medicare IM     [name] 07/01/2024, 3:36 PM

## 2024-07-01 NOTE — Progress Notes (Signed)
 Patient ID: Lauren Clark, female   DOB: 04-Apr-1956, 68 y.o.   MRN: [id number]    Progress Note from the Palliative Medicine Team at [facility]   Patient Name: Lauren Clark        Date: 07/01/2024 DOB: [birth year]  Age: 68 y.o. MRN#: [id number] Attending Physician: Lauren Blackwater, MD Primary Care Physician: Lauren Leni Edyth DELENA, MD Admit Date: 06/26/2024   Reason for Consultation/Follow-up   Establishing Goals of Care   HPI/ Brief Hospital Review  68 y.o. female   admitted on 06/26/2024 with past medical history significant for morbid obesity status post gastric sleeve, history of DVT, A-fib, CHF, hypertension, thoracic aortic aneurysm, hypothyroidism, nonobstructive CAD,   vision impairment admitted for evaluation of elevated troponin.    Recent hospitalization 06/15/2024 to 06/16/2024 for AKI, pyelonephritis, mildly elevated LFTs, and acute on chronic hypoxic respiratory failure   Admitted for treatment and stabilization.  Patient with elevated LFTs, elevated troponin, COPD exacerbation in the setting of influenza A.  Improving with full medical support.  Ongoing discussion regarding next steps in transition of care.     Patient faces treatment option decisions, advanced directive decisions and anticipatory care needs.   Subjective  Extensive chart review has been completed prior to meeting with patient/family  including labs, vital signs, imaging, progress/consult notes, orders, medications and available advance directive documents.   This NP assessed patient at the bedside as a follow up for palliaitve medicine needs and emotional support and to meet as scheduled with main support person/friend/ Lauren Clark  for ongoing conversation/education regarding current complex medical situation, and associated decisions specific to advanced care planning, treatment options and anticipatory care needs.    Patient clearly verbalizes an understanding regarding of her medical diagnosis. She  recognizes significant physical and functional decline over the past month.    Patient is very comfortable speaking to her humanness and her own mortality.  She speaks to her grounded ness in her Buddhist teachings.   She is hopefull for ongoing physical and functional  improvement.  Treat the treatable, open to SNF for short term rehab.  She also communicates with [place] limitations to aggressive medical interventions as it relates to her values on quality of life.          Patient verbalizes desire for Lauren Clark to be her HPOA.  Will ask Spiritual Care to assist patient with documentation    Plan of Care: -DNR/DNI limited-patient is open to all offered and availble medical intervetnions to porolong life at this time -SNF for short term rehab -OP Palliative services at facility  -PMT to continue to support holistically    Education offered on MOST form, to complete before discharge  Education offered today regarding  the importance of continued conversation with family and their  medical providers regarding overall plan of care and treatment options,  ensuring decisions are within the context of the patients values and GOCs.  Questions and concerns addressed   Discussed with primary team and nursing staff   Time:  60   minutes  Detailed review of medical records ( labs, imaging, vital signs), medically appropriate exam ( MS, skin, cardiac,  resp)   discussed with treatment team, counseling and education to patient, family, staff, documenting clinical information, medication management, coordination of care    Ronal Plants NP  Palliative Medicine Team Team Phone # [phone] Pager [phone]

## 2024-07-01 NOTE — Plan of Care (Signed)

## 2024-07-01 NOTE — TOC Progression Note (Signed)
 Transition of Care [facility]) - Progression Note    Patient Details  Name: Lauren Clark MRN: [id number] Date of Birth: 04-12-1956  [facility] CM/SW Contact  Isaiah Public, LCSWA Phone Number: 07/01/2024, 11:19 AM  Clinical Narrative:     CSW spoke with patient and provided SNF bed offers to patient. Patient accepted SN[facility] bed offer with [facility]. Tanya with [facility] confirmed SNF bed for patient. CSW following to start insurance authorization for patient closer to patient being medically ready for dc. CSW will continue to follow.  Expected Discharge Plan: Skilled Nursing Facility Barriers to Discharge: Continued Medical Work up               Expected Discharge Plan and Services       Living arrangements for the past 2 months: Apartment                                       Social Drivers of Health (SDOH) Interventions SDOH Screenings   Food Insecurity: Food Insecurity Present (06/27/2024)  Housing: Low Risk (06/27/2024)  Transportation Needs: Unmet Transportation Needs (06/27/2024)  Utilities: Not At Risk (06/27/2024)  Social Connections: Moderately Integrated (06/27/2024)  Tobacco Use: Low Risk (06/30/2024)    Readmission Risk Interventions     No data to display

## 2024-07-01 NOTE — Progress Notes (Signed)
 Patient refused CPAP for the night

## 2024-07-01 NOTE — Progress Notes (Addendum)
 "  Progress Note  Patient Name: Lauren Clark Date of Encounter: 07/01/2024 [facility] Cardiologist: Vinie JAYSON Maxcy, MD   Interval Summary   Denies any orthopnea, chest pain or shortness of breath.  Vital Signs Vitals:   06/30/24 1540 06/30/24 2007 06/30/24 2313 07/01/24 0412  BP: (!) 123/96 (!) 95/50 (!) 86/59 114/82  Pulse: (!) 109 (!) 123 (!) 120 (!) 123  Resp: (!) 30 17 20  (!) 23  Temp: 98 F (36.7 C) 97.6 F (36.4 C)  98.1 F (36.7 C)  TempSrc: Oral Oral  Oral  SpO2: 98% 97% 96% 97%  Weight:      Height:        Intake/Output Summary (Last 24 hours) at 07/01/2024 0802 Last data filed at 07/01/2024 0422 Gross per 24 hour  Intake 2266.16 ml  Output 705 ml  Net 1561.16 ml      06/26/2024   11:17 PM 06/25/2024    6:03 AM 06/24/2024    5:00 AM  Last 3 Weights  Weight (lbs) 202 lb 224 lb 13.9 oz 229 lb 4.5 oz  Weight (kg) 91.627 kg 102 kg 104 kg      Telemetry/ECG  Atrial fibrillation with heart rates in the 100s to 110s.  The patient went into atrial fibrillation about 2 days ago.- Personally Reviewed  Physical Exam  GEN: No acute distress.  On 4 L nasal cannula. Neck: No JVD Cardiac: Irregularly irregular rhythm, no murmurs, rubs, or gallops.  Respiratory: Clear to auscultation bilaterally. GI: Soft, nontender, non-distended  MS: 1+ edema  Assessment & Plan   Lauren Clark is a 68 y.o. female with HFpEF, CKD 3B, paroxysmal AF, morbid obesity s/p gastric sleeve, prior DVT, HTN, HLD, TAA, hypothyroidism, nonobstructive CAD who presents with ADHF in the setting of flu A+ and newly reduced LVEF (55-60%->30-35%) consistent with possible stress-induced cardiomyopathy.  Septic shock UTI Flu A Multiple organ failure AKI On admission there were concerns for septic shock, and leukocytosis.  The patient was also positive for flu A.  CT of the abdomen found a density in the left send the upper pole calyx. 06/30/2024 the patient underwent a cystoscopy and  was found to have necrotic tissue in her bladder and kidney.  Cultures are pending.  Urology is considering getting a follow-up cystoscopy. She is currently on Zosyn  and Mycamine .  Because of concerns for a fungal UTI. Management per urology, primary, and ID.    Acute HFrEF Combined diastolic and systolic heart failure. TTE on 06/27/2024 showed a reduced LVEF of 30 to 35%, apical akinesis, G2 DD, elevated LAP, normal RV systolic function, moderate AR, and IVC with less than 50% respiratory variability. Previous TTE on 05/2024 showed a normal LVEF of 55 to 60%. Given overall clinical picture mentioned above was previously suspected that the acute decline in systolic function is stress cardiomyopathy. I's and O's are net out 1.5 L since admission. Order Daily weights GDMT is limited by soft blood pressures and AKI.  Creatinine this morning is 2.9. May consider ischemic evaluation after acute concerns for UTI and septic shock have resolved. Will avoid SGLT2 due to concerns of fungal UTI.   Elevated high-sensitivity troponins 157 > 323 > 365 > 77 > 69 Suspect is secondary to demand ischemia from septic shock   Persistent A-fib with RVR Heart rates are in the 100s to 110s.  Rate control is limited by hypotension.  Most recent BP at 8:30 AM this morning was 105/78.  Will manage  A-fib rate control less aggressively due to concerns for sepsis. It appears like IV heparin  was stopped for the cystoscopy yesterday. Restart IV heparin . Plan to transition back to Eliquis  when cleared to do so by urology.   Otherwise management per primary   For questions or updates, please contact [facility] Please consult [email or web address] for contact info under   Signed, Morse Clause, PA-C   History and all data above reviewed.  I personally took the history today, performed the substantive portion of the physical exam and formulated the assessment and plan.  I reviewed all relevant tests and studies.   She is weak.  She does not have any acute complaints.  No chest pain.  Patient examined.  I agree with the findings as above.  The patient exam reveals RNM:Pmmzhlojm  ,  Lungs: Decreased breath sounds at both bases  ,  Abd: Positive bowel sounds, no rebound no guarding, Ext No edema  .  All available labs, radiology testing, previous records reviewed. Agree with documented assessment and plan.   Atrial fib:  On DOAC.  For now continue IV amiodarone .  Unable to push drugs for rate control.  Unable to titrate meds.  I will not plan any ischemia work up this admission.  That would happen after she is allowed to recover from acute events if follow up echo in the future continues to demonstrate reduced EF.    Lauren Clark  11:44 AM  07/01/2024  "

## 2024-07-02 ENCOUNTER — Inpatient Hospital Stay (HOSPITAL_COMMUNITY)

## 2024-07-02 DIAGNOSIS — I4819 Other persistent atrial fibrillation: Secondary | ICD-10-CM | POA: Diagnosis not present

## 2024-07-02 DIAGNOSIS — R6521 Severe sepsis with septic shock: Secondary | ICD-10-CM | POA: Diagnosis not present

## 2024-07-02 DIAGNOSIS — A419 Sepsis, unspecified organism: Secondary | ICD-10-CM

## 2024-07-02 DIAGNOSIS — I5020 Unspecified systolic (congestive) heart failure: Secondary | ICD-10-CM | POA: Diagnosis not present

## 2024-07-02 DIAGNOSIS — J9621 Acute and chronic respiratory failure with hypoxia: Secondary | ICD-10-CM | POA: Diagnosis not present

## 2024-07-02 DIAGNOSIS — R7989 Other specified abnormal findings of blood chemistry: Secondary | ICD-10-CM | POA: Diagnosis not present

## 2024-07-02 LAB — CBC
HCT: 32.9 % — ABNORMAL LOW (ref 36.0–46.0)
Hemoglobin: 10.3 g/dL — ABNORMAL LOW (ref 12.0–15.0)
MCH: 29 pg (ref 26.0–34.0)
MCHC: 31.3 g/dL (ref 30.0–36.0)
MCV: 92.7 fL (ref 80.0–100.0)
Platelets: 233 K/uL (ref 150–400)
RBC: 3.55 MIL/uL — ABNORMAL LOW (ref 3.87–5.11)
RDW: 16.3 % — ABNORMAL HIGH (ref 11.5–15.5)
WBC: 22.9 K/uL — ABNORMAL HIGH (ref 4.0–10.5)
nRBC: 0.4 % — ABNORMAL HIGH (ref 0.0–0.2)

## 2024-07-02 LAB — HEPATIC FUNCTION PANEL
ALT: 746 U/L — ABNORMAL HIGH (ref 0–44)
AST: 254 U/L — ABNORMAL HIGH (ref 15–41)
Albumin: 2.8 g/dL — ABNORMAL LOW (ref 3.5–5.0)
Alkaline Phosphatase: 193 U/L — ABNORMAL HIGH (ref 38–126)
Bilirubin, Direct: 0.4 mg/dL — ABNORMAL HIGH (ref 0.0–0.2)
Indirect Bilirubin: 0.4 mg/dL (ref 0.3–0.9)
Total Bilirubin: 0.8 mg/dL (ref 0.0–1.2)
Total Protein: 5.9 g/dL — ABNORMAL LOW (ref 6.5–8.1)

## 2024-07-02 LAB — BASIC METABOLIC PANEL WITH GFR
Anion gap: 13 (ref 5–15)
BUN: 77 mg/dL — ABNORMAL HIGH (ref 8–23)
CO2: 26 mmol/L (ref 22–32)
Calcium: 9 mg/dL (ref 8.9–10.3)
Chloride: 100 mmol/L (ref 98–111)
Creatinine, Ser: 2.2 mg/dL — ABNORMAL HIGH (ref 0.44–1.00)
GFR, Estimated: 24 mL/min — ABNORMAL LOW
Glucose, Bld: 236 mg/dL — ABNORMAL HIGH (ref 70–99)
Potassium: 3.3 mmol/L — ABNORMAL LOW (ref 3.5–5.1)
Sodium: 139 mmol/L (ref 135–145)

## 2024-07-02 LAB — SURGICAL PATHOLOGY

## 2024-07-02 LAB — CULTURE, BLOOD (ROUTINE X 2)
Culture: NO GROWTH
Culture: NO GROWTH
Special Requests: ADEQUATE

## 2024-07-02 LAB — BLOOD GAS, ARTERIAL
Acid-Base Excess: 2.5 mmol/L — ABNORMAL HIGH (ref 0.0–2.0)
Bicarbonate: 29.3 mmol/L — ABNORMAL HIGH (ref 20.0–28.0)
O2 Saturation: 97.2 %
Patient temperature: 37
pCO2 arterial: 53 mmHg — ABNORMAL HIGH (ref 32–48)
pH, Arterial: 7.35 (ref 7.35–7.45)
pO2, Arterial: 80 mmHg — ABNORMAL LOW (ref 83–108)

## 2024-07-02 MED ORDER — AMOXICILLIN-POT CLAVULANATE 500-125 MG PO TABS
1.0000 | ORAL_TABLET | Freq: Two times a day (BID) | ORAL | Status: DC
  Administered 2024-07-02 (×2): 1 via ORAL
  Filled 2024-07-02 (×3): qty 1

## 2024-07-02 MED ORDER — OXYCODONE HCL 5 MG PO TABS
2.5000 mg | ORAL_TABLET | Freq: Once | ORAL | Status: AC
  Administered 2024-07-02: 2.5 mg via ORAL
  Filled 2024-07-02: qty 1

## 2024-07-02 MED ORDER — APIXABAN 5 MG PO TABS
5.0000 mg | ORAL_TABLET | Freq: Two times a day (BID) | ORAL | Status: DC
  Administered 2024-07-02 – 2024-07-08 (×13): 5 mg via ORAL
  Filled 2024-07-02 (×13): qty 1

## 2024-07-02 MED ORDER — POTASSIUM CHLORIDE CRYS ER 20 MEQ PO TBCR
40.0000 meq | EXTENDED_RELEASE_TABLET | ORAL | Status: AC
  Administered 2024-07-02 (×2): 40 meq via ORAL
  Filled 2024-07-02 (×2): qty 2

## 2024-07-02 MED ORDER — OXYCODONE HCL 5 MG PO TABS
2.5000 mg | ORAL_TABLET | Freq: Four times a day (QID) | ORAL | Status: DC | PRN
  Administered 2024-07-02 – 2024-07-03 (×2): 2.5 mg via ORAL
  Filled 2024-07-02 (×2): qty 1

## 2024-07-02 MED ORDER — OXYCODONE HCL 5 MG PO TABS
5.0000 mg | ORAL_TABLET | Freq: Once | ORAL | Status: AC | PRN
  Administered 2024-07-02: 5 mg via ORAL
  Filled 2024-07-02: qty 1

## 2024-07-02 MED ORDER — AMOXICILLIN-POT CLAVULANATE 875-125 MG PO TABS: 1.0000 | ORAL_TABLET | Freq: Two times a day (BID) | ORAL | Status: DC

## 2024-07-02 MED ORDER — METOPROLOL TARTRATE 12.5 MG HALF TABLET
12.5000 mg | ORAL_TABLET | Freq: Three times a day (TID) | ORAL | Status: DC
  Administered 2024-07-02 – 2024-07-04 (×5): 12.5 mg via ORAL
  Filled 2024-07-02 (×6): qty 1

## 2024-07-02 NOTE — Progress Notes (Signed)
 This chaplain responded to PMT [facility] consult for creating the Pt. Advance Directive:  HCPOA and Living Will. The Pt. is awake and agreed to AD education with the chaplain. The Pt. phoned her friend-Lisa to be part of the AD education. The AD is filled out. Notary services are not available until the Pt. droplet precautions are lifted. The Pt. and Olam accepted the timing of a notary visit.  The Pt. named Lisa Fullington as her healthcare agent. The Pt. filled out a Living Will. The filled out AD is in the Pt. pocket book.  This chaplain is available for F/U spiritual care as needed.  Chaplain Leeroy Hummer [phone]

## 2024-07-02 NOTE — Progress Notes (Signed)
 RT sent ABG sample at this time. Lab notified.

## 2024-07-02 NOTE — Plan of Care (Signed)

## 2024-07-02 NOTE — Progress Notes (Signed)
 "  2 Days Post-Op Subjective: First time meeting Lauren Clark.  She was resting comfortably in bed and appeared much more stable than her labs would suggest.  We reviewed the case and plan for eventual ureteroscopy.  All questions were answered to her satisfaction.  Objective: Vital signs in last 24 hours: Temp:  [96.4 F (35.8 C)-98.1 F (36.7 C)] 97.3 F (36.3 C) (12/30 0843) Pulse Rate:  [95-133] 117 (12/30 1131) Resp:  [22-43] 28 (12/30 1131) BP: (97-162)/(65-142) 128/71 (12/30 1056) SpO2:  [88 %-100 %] 92 % (12/30 1131) Weight:  [103.3 kg] 103.3 kg (12/30 0500)  Assessment/Plan: # ARF # Left hydronephrosis # Bladder fungal ball  Cystoscopy with cystolitholopaxy of 7 cm bladder collection/foreign body.  Ureteroscopy with laser destruction of lesion of upper and lower pole of left kidney with Dr. Cam on 12/27.  This was unable to be fully removed and patient will need second look in the near future. This may be done on an outpt basis as long as she continues to improve clinically  UCx positive for Enterococcus.  Receiving Augmentin  and micafungin .  Hemodynamically stable, interval improvement in leukocytosis, afebrile.  Voiding trial at the discretion of primary team.   Will follow peripherally. Please call with questions.   Intake/Output from previous day: 12/29 0701 - 12/30 0700 In: 1152.3 [P.O.:510; I.V.:516.1; IV Piggyback:126.2] Out: 1175 [Urine:1175]  Intake/Output this shift: Total I/O In: 180 [P.O.:180] Out: 700 [Urine:700]  Physical Exam:  General: Alert and oriented CV: No cyanosis Lungs: equal chest rise Gu: 41f foley catheter in place, draining clear yellow urine  Lab Results: Recent Labs    06/30/24 1523 07/01/24 1431 07/02/24 0920  HGB 10.2*  10.1* 10.2* 10.3*  HCT 32.2*  31.5* 32.0* 32.9*   BMET Recent Labs    07/01/24 0500 07/01/24 1431 07/02/24 0920  NA 140  --  139  K 4.1  --  3.3*  CL 99  --  100  CO2 25  --  26  GLUCOSE 147*   --  236*  BUN 83*  --  77*  CREATININE 2.90*  --  2.20*  CALCIUM  9.2  --  9.0  HGB  --  10.2* 10.3*  WBC  --  26.0* 22.9*     Studies/Results: MR LUMBAR SPINE WO CONTRAST Result Date: 07/02/2024 EXAM: MRI LUMBAR SPINE 07/02/2024 12:53:13 AM TECHNIQUE: Multiplanar multisequence MRI of the lumbar spine was performed without the administration of intravenous contrast. COMPARISON: None available. CLINICAL HISTORY: Concern for osteomyelitis. Severe pain. FINDINGS: BONES AND ALIGNMENT: Mild dextroscoliosis of the thoracolumbar spine. Grade 1 degenerative anterolisthesis present at L3-L4 and L4-L5. Normal vertebral body heights. Bone marrow signal is unremarkable. Mild facet hypertrophic changes. SPINAL CORD: The conus terminates normally. SOFT TISSUES: No paraspinal mass. T12-L1: Central disc bulge causing mild central spinal canal stenosis. L1-L2: No significant disc herniation. No spinal canal stenosis or neural foraminal narrowing. L2-L3: No significant disc herniation. No spinal canal stenosis or neural foraminal narrowing. L3-L4: Grade 1 degenerative anterolisthesis with mild-to-moderate central spinal canal stenosis. L4-L5: Grade 1 anterolisthesis with mild central spinal canal stenosis and moderate right neural foraminal stenosis. L5-S1: Diffuse disc bulging and endplate ridging, but no significant spinal canal or neural foraminal stenosis. IMPRESSION: 1. Mild dextroscoliosis of the thoracolumbar spine. 2. Grade 1 degenerative anterolisthesis at L3-4 and L4-5. 3. Mild central spinal canal stenosis and moderate right neural foraminal stenosis at L4-5. 4. Mild-to-moderate central spinal canal stenosis at L3-4. 5. Mild central spinal canal stenosis at T12-L1.  Electronically signed by: Evalene Coho MD 07/02/2024 06:07 AM EST RP Workstation: [id number]      LOS: 5 days   Ole Bourdon, NP [facility] Pager: [phone]  07/02/2024, 12:30 PM  "

## 2024-07-02 NOTE — Hospital Course (Signed)
 68 y.o. female with medical history significant of COPD, chronic hypoxemic respiratory failure on 2 L home oxygen, CAD, A-fib on Eliquis , HFpEF, ascending aortic aneurysm followed by cardiothoracic surgery, hypertension, hyperlipidemia, hypothyroidism, CKD stage IIIa, anemia, anxiety, depression, GERD, glaucoma, vision impairment, morbid obesity status post gastric sleeve surgery, PVD.  Recent hospital admission 12/13-12/24 for AKI, pyelonephritis, ileus, mildly elevated LFTs, and acute on chronic hypoxemic respiratory failure secondary to CHF exacerbation. Creatinine had peaked to 3.86.  Her diuretics were held and she was given IV albumin .  Creatinine improved to 2.97 at the time of discharge and nephrology resumed Lasix ..  Completed 7 days of Rocephin  for pyelonephritis.  She was evaluated by physical therapy and discharged with home health PT/OT afternoon.    Patient returned to the ED via EMS in the evening due to concern for shortness of breath, fall at home, and possible syncope. Admitted for acute on chronic hypoxic respiratory failure, Acute influenza PNA with superimposed bacterial PNA, acute COPD exacerbation, NSTEMI, acute CHF exacerbation, prolonged Qtc, Hypotension, acute liver failure, and acute on chronic kidney disease.

## 2024-07-02 NOTE — Progress Notes (Signed)
" °  RRRN notified of pt having sx of possible shock. As pt's  bp has been elevated but is now low. Her temperature has been low all day. Pt also has a red mews, tachypnea, tachycardia, known infection, & is now c/o nausea & just not feeling right. Passed on to oncoming nurse Shanda.  "

## 2024-07-02 NOTE — Progress Notes (Signed)
 BiPAP currently on standby.  Patient does not want to wear at this time.

## 2024-07-02 NOTE — Plan of Care (Signed)
" °  Problem: Skin Integrity: Goal: Risk for impaired skin integrity will decrease Outcome: Progressing   Problem: Safety: Goal: Ability to remain free from injury will improve Outcome: Progressing   Problem: Pain Managment: Goal: General experience of comfort will improve and/or be controlled Outcome: Progressing   Problem: Activity: Goal: Risk for activity intolerance will decrease Outcome: Progressing   Problem: Clinical Measurements: Goal: Respiratory complications will improve Outcome: Progressing   Problem: Clinical Measurements: Goal: Diagnostic test results will improve Outcome: Progressing   Problem: Clinical Measurements: Goal: Ability to maintain clinical measurements within normal limits will improve Outcome: Progressing   Problem: Health Behavior/Discharge Planning: Goal: Ability to manage health-related needs will improve Outcome: Progressing   "

## 2024-07-02 NOTE — Progress Notes (Addendum)
 "        [facility]  Date of Admission:  06/26/2024     Reason for Consult: ?fungal ball in kidney/bladder                             Referring Provider: Cosette         Lines:  12/24-c urethral catheter 12/24-c left ureteral stent 12/24-c picc double lumen   Abx: 12/28-c micafungin  12/25-c piptazo 12/25-c oseltamivir                                                        12/25-27 vanc   Assessment: 68 yo female with copd on home o2, dm2 on jardiance, recent pyelo, readmitted with influenza/copd exacerbation and shock ?septic/cardiogenic (tte 30% ef), with ct imaging w/u for aki/transaminitis showing new left renal and bladder mass, concerning for fungal ball     Patient underwent on 12/28 cystoscopy and ureteroscopy with left ureteral stent placement and retrieval of the left renal mass and bladder mass. The bladder mass appear mostly necrotic and the left renal mass as well with some viable tissue   Frozen section (spoke with pathologist dr Macie Hummingbird) who reports no viable tissue on several cut of frozen section and she sees scattered cocci in the mass. The bacteria likely bystanders and unsual would expect gnr     Patient doesn't have risk factors for fungal infection outside of candida (she is on jardiance; but didn't have dka). Mucor/aspergillus would be extremely rare   Ucx e feacalis appear to be what is seen in the bladder mass frozen section. Not entirely clear if superimposed bacterial infection at this time but will keep on abx for now    Given her severe presentation although the shock could be cardiogenic from possible influenza related myocarditis vs sepsis in general, can't r/o septic process from renal yet, and will cover for gu infection       Epidemiologically speaking, micafungin  would penetrate the kidney but not bladder (however bladder mass removed), and would use this for presumed candida pyelonephritis. Would also continue  empiric piptazo for now.    Bcx on admission has been negative, but if candida invasive disease / bsi could grow in around 5 days     Also has lower back pain that is rather severe. Will see if mri suggestive of any pyogenic process     Influenza/copd exacerbation seems to be improving with supportive care from primary team. Chest ct did suggest a viral pneumonitis vs some fluid component from cardiogenic shock   Shock associated lft elevation also improving   Micro: 12/28 renal/bladder  Bacterial cx - in process Afb cx - in process Fungal cx - in process   ------------------- 07/02/24 id assessment Mri lumbar spine no sign of infection Lft improving Still on supplemental o2 and tenuous Back pain stable  No fever  Cardiology following for chf and afib/rvr. On amiodarone    Query if brief uptick in wbc due to delayed effect of prednisone  (last dose 12/29)   Plan: Change piptazo to oral amox-clav Influenza pneumonitis supportive care per primary team Continue 100 mg daily F/u pathology and fungal cx from renal/bladder tissue Ct abd pelv tomorrow. If stable, second look by urology would defer to  them as would not suggest mucor process Maintain droplet precaution Appreciate urology and triad care for patient  Principal Problem:   Acute on chronic respiratory failure with hypoxemia (HCC) Active Problems:   AKI (acute kidney injury)   Pyelonephritis   COPD with acute exacerbation (HCC)   Elevated troponin   UTI (urinary tract infection)   Acute HFrEF (heart failure with reduced ejection fraction) (HCC)   HFrEF (heart failure with reduced ejection fraction) (HCC)   Pressure injury of skin   Influenza A   Severe sepsis with septic shock (HCC)   Takotsubo cardiomyopathy   Allergies[1]  Scheduled Meds:  amoxicillin -clavulanate  1 tablet Oral BID   apixaban   5 mg Oral BID   brimonidine   1 drop Right Eye BID   And   timolol   1 drop Right Eye BID   Chlorhexidine   Gluconate Cloth  6 each Topical Daily   diclofenac  Sodium  2 g Topical TID AC & HS   FLUoxetine   40 mg Oral Daily   guaiFENesin   600 mg Oral BID   latanoprost   1 drop Right Eye QHS   levothyroxine   112 mcg Oral q morning   lidocaine   1 patch Transdermal QHS   metoprolol  tartrate  12.5 mg Oral TID   oseltamivir   30 mg Oral Daily   sodium bicarbonate   325 mg Oral BID   sodium chloride  flush  10-40 mL Intracatheter Q12H   Continuous Infusions:  amiodarone  30 mg/hr (07/02/24 1428)   micafungin  (MYCAMINE ) 100 mg in sodium chloride  0.9 % 100 mL IVPB 100 mg (07/02/24 1552)   PRN Meds:.albuterol , calcium  carbonate, oxyCODONE , prochlorperazine , sodium chloride  flush   SUBJECTIVE: Pain in back controlled Still dyspneic No fever  Brief uptick in wbc to 40 down trending rapidly  Review of Systems: ROS All other ROS was negative, except mentioned above     OBJECTIVE: Vitals:   07/02/24 1056 07/02/24 1131 07/02/24 1347 07/02/24 1737  BP: 128/71  (!) 103/55 (!) 82/58  Pulse: (!) 119 (!) 117 (!) 141 (!) 102  Resp: (!) 28 (!) 28 (!) 27 17  Temp:   (!) 96.9 F (36.1 C) (!) 96.7 F (35.9 C)  TempSrc:   Axillary Axillary  SpO2: (!) 88% 92% 97% 90%  Weight:      Height:       Body mass index is 37.9 kg/m.  Physical Exam  General/constitutional: slightly tachypneic; conversant; on supplemental o2 HEENT: Normocephalic, PER, Conj Clear, EOMI, Oropharynx clear Neck supple CV: rrr no mrg Lungs: exp wheeze Abd: Soft, Nontender Ext: no edema Skin: No Rash Neuro: nonfocal MSK: no peripheral joint swelling/tenderness/warmth; back spines nontender  Lab Results Lab Results  Component Value Date   WBC 22.9 (H) 07/02/2024   HGB 10.3 (L) 07/02/2024   HCT 32.9 (L) 07/02/2024   MCV 92.7 07/02/2024   PLT 233 07/02/2024    Lab Results  Component Value Date   CREATININE 2.20 (H) 07/02/2024   BUN 77 (H) 07/02/2024   NA 139 07/02/2024   K 3.3 (L) 07/02/2024   CL 100 07/02/2024    CO2 26 07/02/2024    Lab Results  Component Value Date   ALT 746 (H) 07/02/2024   AST 254 (H) 07/02/2024   ALKPHOS 193 (H) 07/02/2024   BILITOT 0.8 07/02/2024      Microbiology: Recent Results (from the past 240 hours)  Resp panel by RT-PCR (RSV, Flu A&B, Covid) Anterior Nasal Swab     Status: Abnormal   Collection  Time: 06/27/24 12:05 AM   Specimen: Anterior Nasal Swab  Result Value Ref Range Status   SARS Coronavirus 2 by RT PCR NEGATIVE NEGATIVE Final   Influenza A by PCR POSITIVE (A) NEGATIVE Final   Influenza B by PCR NEGATIVE NEGATIVE Final    Comment: (NOTE) The Xpert Xpress SARS-CoV-2/FLU/RSV plus assay is intended as an aid in the diagnosis of influenza from Nasopharyngeal swab specimens and should not be used as a sole basis for treatment. Nasal washings and aspirates are unacceptable for Xpert Xpress SARS-CoV-2/FLU/RSV testing.  Fact Sheet for Patients: bloggercourse.com  Fact Sheet for Healthcare Providers: seriousbroker.it  This test is not yet approved or cleared by the [place]  FDA and has been authorized for detection and/or diagnosis of SARS-CoV-2 by FDA under an Emergency Use Authorization (EUA). This EUA will remain in effect (meaning this test can be used) for the duration of the COVID-19 declaration under Section 564(b)(1) of the Act, 21 U.S.C. section 360bbb-3(b)(1), unless the authorization is terminated or revoked.     Resp Syncytial Virus by PCR NEGATIVE NEGATIVE Final    Comment: (NOTE) Fact Sheet for Patients: bloggercourse.com  Fact Sheet for Healthcare Providers: seriousbroker.it  This test is not yet approved or cleared by the [place]  FDA and has been authorized for detection and/or diagnosis of SARS-CoV-2 by FDA under an Emergency Use Authorization (EUA). This EUA will remain in effect (meaning this test can be used) for the  duration of the COVID-19 declaration under Section 564(b)(1) of the Act, 21 U.S.C. section 360bbb-3(b)(1), unless the authorization is terminated or revoked.  Performed at [facility], [address], [place], [place] [address]   Urine Culture     Status: Abnormal   Collection Time: 06/27/24  6:20 AM   Specimen: Urine, Clean Catch  Result Value Ref Range Status   Specimen Description URINE, CLEAN CATCH  Final   Special Requests   Final    NONE Performed at [facility], [address] [place], [place] [address]    Culture >=100,000 COLONIES/mL ENTEROCOCCUS FAECALIS (A)  Final   Report Status 07/01/2024 FINAL  Final   Organism ID, Bacteria ENTEROCOCCUS FAECALIS (A)  Final      Susceptibility   Enterococcus faecalis - MIC*    AMPICILLIN <=2 SENSITIVE Sensitive     NITROFURANTOIN <=16 SENSITIVE Sensitive     VANCOMYCIN  1 SENSITIVE Sensitive     * >=100,000 COLONIES/mL ENTEROCOCCUS FAECALIS  Culture, blood (Routine X 2) w Reflex to ID Panel     Status: None   Collection Time: 06/27/24  6:57 AM   Specimen: BLOOD LEFT HAND  Result Value Ref Range Status   Specimen Description BLOOD LEFT HAND  Final   Special Requests   Final    BOTTLES DRAWN AEROBIC ONLY Blood Culture results may not be optimal due to an inadequate volume of blood received in culture bottles   Culture   Final    NO GROWTH 5 DAYS Performed at [facility], [address], [place], [place] [address]    Report Status 07/02/2024 FINAL  Final  Culture, blood (Routine X 2) w Reflex to ID Panel     Status: None   Collection Time: 06/27/24  7:45 AM   Specimen: BLOOD  Result Value Ref Range Status   Specimen Description BLOOD RIGHT ANTECUBITAL  Final   Special Requests   Final    BOTTLES DRAWN AEROBIC AND ANAEROBIC Blood Culture adequate volume  Culture   Final    NO GROWTH 5 DAYS Performed at [facility], [address] [place], [place] [address]    Report Status 07/02/2024 FINAL  Final   MRSA Next Gen by PCR, Nasal     Status: None   Collection Time: 06/27/24 11:38 AM   Specimen: Nasal Mucosa; Nasal Swab  Result Value Ref Range Status   MRSA by PCR Next Gen NOT DETECTED NOT DETECTED Final    Comment: (NOTE) The GeneXpert MRSA Assay (FDA approved for NASAL specimens only), is one component of a comprehensive MRSA colonization surveillance program. It is not intended to diagnose MRSA infection nor to guide or monitor treatment for MRSA infections. Test performance is not FDA approved in patients less than 72 years old. Performed at [facility], [address] [place], [place] [address]   Aerobic/Anaerobic Culture w Gram Stain (surgical/deep wound)     Status: None (Preliminary result)   Collection Time: 06/30/24 10:34 AM   Specimen: Path fluid; Body Fluid  Result Value Ref Range Status   Specimen Description FLUID LEFT KIDNEY  Final   Special Requests NONE  Final   Gram Stain RARE WBC SEEN NO ORGANISMS SEEN   Final   Culture   Final    NO GROWTH 2 DAYS NO ANAEROBES ISOLATED; CULTURE IN PROGRESS FOR 5 DAYS Performed at [facility], [address] [place], [place] [address]    Report Status PENDING  Incomplete     Serology:   Imaging: If present, new imagings (plain films, ct scans, and mri) have been personally visualized and interpreted; radiology reports have been reviewed. Decision making incorporated into the Impression / Recommendations.  12/30 mri lumbar spine 1. Mild dextroscoliosis of the thoracolumbar spine. 2. Grade 1 degenerative anterolisthesis at L3-4 and L4-5. 3. Mild central spinal canal stenosis and moderate right neural foraminal stenosis at L4-5. 4. Mild-to-moderate central spinal canal stenosis at L3-4. 5. Mild central spinal canal stenosis at T12-L1.  Constance ONEIDA Passer, MD [facility] [phone] pager    07/02/2024, 5:56 PM     [1]  Allergies Allergen Reactions    Crestor  [Rosuvastatin  Calcium ] Other (See Comments)    Muscle aches     Hctz [Hydrochlorothiazide] Other (See Comments)    Pt doesn't remember   Lipitor [Atorvastatin] Other (See Comments)    Muscle aches    "

## 2024-07-02 NOTE — TOC Progression Note (Signed)
 Transition of Care [facility]) - Progression Note    Patient Details  Name: Lauren Clark MRN: [id number] Date of Birth: Mar 27, 1956  Transition [facility]) CM/SW Contact  Isaiah Public, LCSWA Phone Number: 07/02/2024, 11:12 AM  Clinical Narrative:     Patient has SNF bed at [facility]. CSW following to start insurance authorization closer to patient being medically ready. CSW will continue to follow and assist with patients dc planning needs.  Expected Discharge Plan: Skilled Nursing Facility Barriers to Discharge: Continued Medical Work up               Expected Discharge Plan and Services       Living arrangements for the past 2 months: Apartment                                       Social Drivers of Health (SDOH) Interventions SDOH Screenings   Food Insecurity: Food Insecurity Present (06/27/2024)  Housing: Low Risk (06/27/2024)  Transportation Needs: Unmet Transportation Needs (06/27/2024)  Utilities: Not At Risk (06/27/2024)  Social Connections: Moderately Integrated (06/27/2024)  Tobacco Use: Low Risk (06/30/2024)    Readmission Risk Interventions     No data to display

## 2024-07-02 NOTE — Progress Notes (Signed)
" °   07/02/24 1056  Assess: MEWS Score  BP 128/71  Pulse Rate (!) 119  ECG Heart Rate (!) 125  Resp (!) 28  Level of Consciousness Alert  SpO2 (!) 88 %  O2 Device Nasal Cannula  O2 Flow Rate (L/min) 4 L/min  Assess: MEWS Score  MEWS Temp 0  MEWS Systolic 0  MEWS Pulse 2  MEWS RR 2  MEWS LOC 0  MEWS Score 4  MEWS Score Color Red  Assess: if the MEWS score is Yellow or Red  Were vital signs accurate and taken at a resting state? Yes  Does the patient meet 2 or more of the SIRS criteria? Yes  Does the patient have a confirmed or suspected source of infection? Yes  Notify: Charge Nurse/RN  Name of Charge Nurse/RN Notified christopher ralph  Provider Notification  Provider Name/Title tariq hassan,md  Date Provider Notified 07/02/24  Time Provider Notified 1100  Method of Notification Page  Notification Reason Change in status;Other (Comment)  Provider response See new orders  Date of Provider Response 07/02/24  Time of Provider Response 1109  Assess: SIRS CRITERIA  SIRS Temperature  0  SIRS Respirations  1  SIRS Pulse 1  SIRS WBC 1  SIRS Score Sum  3    "

## 2024-07-02 NOTE — Progress Notes (Addendum)
 "  Progress Note  Patient Name: Lauren Clark Date of Encounter: 07/02/2024 [facility] Cardiologist: Vinie JAYSON Maxcy, MD   Interval Summary   Reported that she had some difficulty sleeping last night.  Denies any new or worsening symptoms.  Has chronic shortness of breath and orthopnea.  Vital Signs Vitals:   07/01/24 2157 07/01/24 2327 07/02/24 0416 07/02/24 0500  BP:  123/84 111/71   Pulse: (!) 119 (!) 121 100   Resp: (!) 27 (!) 22 (!) 43   Temp:  (!) 96.4 F (35.8 C) 97.7 F (36.5 C)   TempSrc:  Axillary Oral   SpO2: 99% 100% 91%   Weight:    103.3 kg  Height:        Intake/Output Summary (Last 24 hours) at 07/02/2024 0739 Last data filed at 07/02/2024 0417 Gross per 24 hour  Intake 1152.27 ml  Output 1175 ml  Net -22.73 ml      07/02/2024    5:00 AM 07/01/2024    9:04 AM 06/26/2024   11:17 PM  Last 3 Weights  Weight (lbs) 227 lb 11.8 oz 237 lb 3.4 oz 202 lb  Weight (kg) 103.3 kg 107.6 kg 91.627 kg      Telemetry/ECG  Atrial fibrillation with heart rates in the 100s to 120s- Personally Reviewed  Physical Exam  GEN: No acute distress.   Neck: No JVD Cardiac: Irregularly irregular rhythm, no murmurs, rubs, or gallops.  Respiratory: Diffuse wheezing heard throughout the lungs. GI: Soft, nontender, non-distended  MS: No edema  Assessment & Plan   LYNISHA OSUCH is a 68 y.o. female with HFpEF, CKD 3B, paroxysmal AF, morbid obesity s/p gastric sleeve, prior DVT, HTN, HLD, TAA, hypothyroidism, nonobstructive CAD who presents with ADHF in the setting of flu A+ and newly reduced LVEF (55-60%->30-35%) consistent with possible stress-induced cardiomyopathy.    Septic shock UTI Flu A Multiple organ failure AKI On admission there were concerns for septic shock, and leukocytosis.  The patient was also positive for flu A.  CT of the abdomen found a density in the left send the upper pole calyx. 06/30/2024 the patient underwent a cystoscopy and was found to  have necrotic tissue in her bladder and kidney.  Cultures are pending.  Urology is considering getting a follow-up cystoscopy. She is currently on Zosyn  and Mycamine .  Because of concerns for a fungal UTI. Creatinine peaked at about 3.68.  Creatinine on 07/01/2024 was 2.90.  Baseline appears to be about 1.0-1.3. Management per urology, primary, and ID.     Acute HFrEF Combined diastolic and systolic heart failure. TTE on 06/27/2024 showed a reduced LVEF of 30 to 35%, apical akinesis, G2 DD, elevated LAP, normal RV systolic function, moderate AR, and IVC with less than 50% respiratory variability. Previous TTE on 05/2024 showed a normal LVEF of 55 to 60%. Given overall clinical picture mentioned above was previously suspected that the acute decline in systolic function is stress cardiomyopathy. I's and O's are net out 1.5 L since admission. Order Daily weights GDMT is limited by soft blood pressures and AKI.  Creatinine on 07/01/2024 was 2.9.  Labs for this morning are still pending. Plan to recheck echo in 2 to 3 months.  May consider ischemic evaluation at that time if LVEF remains reduced. Will avoid SGLT2 due to concerns of fungal UTI.     Elevated high-sensitivity troponins 157 > 323 > 365 > 77 > 69 Suspect is secondary to demand ischemia from septic shock  Persistent A-fib with RVR Heart rates are in the 100s to 110s.  Rate control is limited by hypotension.  Most recent BP at about 4 AM this morning was 111/71.  Will manage A-fib rate control less aggressively due to concerns for sepsis. It appears like IV heparin  was stopped for the cystoscopy yesterday.  Was started on IV amiodarone .  As of 8:30 on 07/02/2024 has received about 2.5 g of amiodarone . Continue IV heparin .  May transition to Eliquis  when cleared to do so by urology. Continue IV amiodarone . May consider starting metoprolol  tartrate 12.5 mg every 6 hours.   Elevated LFTs ALT peaked at 2684.  They are  downtrending. Management per primary   Otherwise management per primary    For questions or updates, please contact  [facility] Please consult [email or web address] for contact info under    Signed, Morse Clause, PA-C   History and all data above reviewed.  I personally took the history today, performed the physical exam and formulated the substantive portion of the assessment and plan.  I reviewed all relevant tests and studies. Patient examined.  I agree with the findings as above.    The patient exam reveals RNM:Pmmzhlojm  ,  Lungs: Decreased breath sounds  ,  Abd: Positive bowel sounds, no rebound no guarding, Ext Mild diffuse edema  .  All available labs, radiology testing, previous records reviewed.   Agree with documented assessment and plan.   Acute HFrEF:    This is new compared with most recent TTE in Nov.  Likely related to acute illness and will be followed with repeat echos.  Unable to titrate meds with hypotension and AKI.    Atrial fib with RVR:  Resume heparin  when I with primary team. and transition to DOAC when OK with urology and primary team.   Rate is currently controlled with IV amio .  She is not a good candidate for this long term at all.  I am going to try to titrate a low dose of beta blocker.       Lynwood Jamori Biggar  11:40 AM  07/02/2024  "

## 2024-07-02 NOTE — Progress Notes (Addendum)
 " PROGRESS NOTE    Lauren Clark  FMW:[id number] DOB: 05-Feb-1956 DOA: 06/26/2024 PCP: [name], MD  Subjective:   No acute events overnight. Seen and examined at bedside. Denies any chest pain, palpitations or dizziness. Tolerating oral intake without n/v. Denies constipation.   Hospital Course:  68 y.o. female with medical history significant of COPD, chronic hypoxemic respiratory failure on 2 L home oxygen, CAD, A-fib on Eliquis , HFpEF, ascending aortic aneurysm followed by cardiothoracic surgery, hypertension, hyperlipidemia, hypothyroidism, CKD stage IIIa, anemia, anxiety, depression, GERD, glaucoma, vision impairment, morbid obesity status post gastric sleeve surgery, PVD.  Recent hospital admission 12/13-12/24 for AKI, pyelonephritis, ileus, mildly elevated LFTs, and acute on chronic hypoxemic respiratory failure secondary to CHF exacerbation. Creatinine had peaked to 3.86.  Her diuretics were held and she was given IV albumin .  Creatinine improved to 2.97 at the time of discharge and nephrology resumed Lasix ..  Completed 7 days of Rocephin  for pyelonephritis.  She was evaluated by physical therapy and discharged with home health PT/OT afternoon.    Patient returned to the ED via EMS in the evening due to concern for shortness of breath, fall at home, and possible syncope. Admitted for acute on chronic hypoxic respiratory failure, Acute influenza PNA with superimposed bacterial PNA, acute COPD exacerbation, NSTEMI, acute CHF exacerbation, prolonged Qtc, Hypotension, acute liver failure, and acute on chronic kidney disease.    Assessment and Plan:  Acute on chronic hypoxemic respiratory failure Patient was noted to be saturating in the 80s on her home 2 L [place] with EMS.  She was wheezing with EMS and was given albuterol  treatment. - currently on 4L < BiPAP, on 2L via [place] at home - etiology unclear, likely multifactorial in the setting of acute COPD exacerbation, acute influenza A  infection with superimposed bacterial PNA, acute CHF exacerbation - less likely acute PE as already on anticoagulation  - Blood gas without evidence of hypercapnia.   - see management of active co-morbidities as elsewhere - wean O2 as tolerated    Possible acute on chronic HFpEF Hypotension - clinically euvolemic now - CXR with no any acute findings but stable cardiomegaly - proBNP 32843 < 8463 - TTE showed apical akinesis concerning for stress induced cardiomyopathy but cannot exclude LAD distribution ischemia, LVEF 30-35%, RV size/systolic function normal, IVC normal size with <50% variability suggesting RAP - given lasix  40mg  IV once in the ED and IV lasix  80mg  once 12/26 by cardio - cont external foley for accurate I/Os - daily weights - monitor on tele - cardiology following - palliative following   Sepsis Acute influenza A infection Possible bacterial PNA Acute complicated UTI Possible renal fungal mass - Recently finished 7-day course of Rocephin  for pyelonephritis. Prior UCx with polymicrobial growth at that time - UA continues to show evidence of pyuria and bacteria.  Ucx with ampicillin-sensitive E. Faecalis - BCx with no growth  - Influenza A PCR positive.  - MRSA negative - procalcitonin 8 < 16 - CXR with no any acute findings but stable cardiomegaly - CT chest w/o contrast showed interval development of patchy ground-glass opacity with interlobular septal thickening in both upper lobes, L > R. Confluent areas of consolidative airspace disease are seen in a patchy distribution of the mid and lower lungs bilaterally. Imaging features are compatible with multifocal pneumonia. - CT abdomen pelvis w/o contrast showed mild L hydroureteronephrosis without stone, high density material at upper pole calyx in L kidney that maybe blood product/infectious lesion/neoplasm,  lamellated stone with internal gas in urinary bladder, cholelithiasis  - status post ureterocystoscopy  w/ L retrograde pyelogram and Cystolitholapaxy 12/28 concerning for possible L renal and urinary bladder amorphous masses - renal and bladder mass biopsies with NGTD but final read pending, pathology pending - cont oseltamivir , plan for 10 day course to end 07/07/24 - stopped vancomycin  - cont zosyn , duration TBD, consider a 7 day course - cont on micafungin  for presumed fungal ball in kidney, plan for 14 day course - will need to follow up with urology outpatient for a possible repeat cystoscopy in the future - ID following  - urology following - palliative following   Paroxysmal Atrial fibrillation RVR noted overnight 12/26 - status post heparin  drip for ACS coverage (~48 hours). - cont on amiodarone  drip - plan to resume metoprolol  soon as per cardio recs - stop heparin  drip now - resume eliquis  as per urology recs   Acute COPD exacerbation - status post albuterol  nebs, Solu-Medrol  125 mg, and IV mag 2 g in the ED.  - finished 5 days total steroid course on 12/29 - cont duonebs ATC - cont albuterol  PRN   Elevated troponin NSTEMI History of CAD - EKG without acute ischemic changes - Trop 69 < 77, peaked at 365 - TTE with apical akinesis - status post heparin  drip for ACS coverage (~48 hours). - resume home eliquis   - likely no plan for cardiac catheterization as per cardio recs  - hold statin given acute liver failiure - plan to resume metoprolol  soon as per cardio recs (was stopped for hypotension) - monitor on tele - cardiology following   Acute liver failure  - improving - Patient is reporting history of postprandial abdominal pain for several months - Transaminases and alkaline phosphatase worse compared to labs done during recent hospitalization. AST 586, ALT 276, alk phos 203.   - etiology unclear, likely shock liver in the setting of hypotension due to possible transient shock state (septic vs cardiogenic) - Right upper quadrant abdominal ultrasound showed  gallstones without sonographic findings of acute cholecystitis.  Showing increased hepatic parenchymal echogenicity typical of steatosis.   - Acute hepatitis panel negative  - acetaminophen  level low  - hold home statin therapy - finished NAC therapy  - monitor LFTs - She will need outpatient evaluation by general surgery for consideration of cholecystectomy after her acute pulmonary and cardiac issues resolve.    AKI on CKD stage IIIa High anion gap metabolic acidosis - Creatinine 2.9 < 3.02, peaked at 3.68 this admission, 2.97 on discharge 06/26/24, baseline before last admission was around 1.2.  - concern for fluid overload state - CT A/P findings as elsewhere - status post lasix  IV dose in the ED - occasional diuresis as elsewhere - cont sodium bicarbonate  325mg  BID (goal serum bicarb > 22) - avoid nephrotoxic agents - renal dose medications - monitor I/Os, renal function  - urology following  Chronic Intermittent Back pain Dengenerative disc disease Mechanical Fall - No traumatic injuries identified on imaging of chest, pelvis, bilateral knees, left foot, head, and C-spine.  Fall precautions.  - MRI L spine 12/29 showed Mild dextroscoliosis of the thoracolumbar spine, Grade 1 degenerative anterolisthesis at L3-4 and L4-5, Mild central spinal canal stenosis and moderate right neural foraminal stenosis at L4-5, Mild-to-moderate central spinal canal stenosis at L3-4, Mild central spinal canal stenosis at T12-L1. - cannot give acetaminophen  given liver failure - want to avoid systemic NSAIDs given AKI on CKD - cont voltaren  gel -  cont lidocaine  patch - can consider one time doses of opioids if pain unbearable - encourage ambulation and OOB to chair during daytime - PT/OT following   Lactic acidosis - likely in the setting of hypotension due to possible shock state (septic vs cardiogenic), NSTEMI and acute liver failure - resolved now - monitor clinically   QT prolongation QTc  531 on EKG.   - amiodarone  as elsewhere - Monitor potassium and magnesium  levels.  -  Avoid QT prolonging drugs if possible.  -  Follow-up repeat EKG.   Hyperlipidemia - hold statin therapy given acute liver failure   Mild chronic hyponatremia - resolved - asymptomatic - likely due to hypervolemia.  - status post intermittent IV Lasix  doses - monitor BMP   Hypothyroidism - cont levothyroxine    Anxiety and depression - cont home fluoxetine   DVT prophylaxis: SCDs Start: 06/29/24 1408 apixaban  (ELIQUIS ) tablet 5 mg      Code Status: Limited: Do not attempt resuscitation (DNR) -DNR-LIMITED -Do Not Intubate/DNI   Disposition Plan: SNF Reason for continuing need for hospitalization: IV antibiotics, IV antifungal, wean oxygen, cardiology and ID recommendations, SNF placement plan  Objective: Vitals:   07/01/24 2327 07/02/24 0416 07/02/24 0500 07/02/24 0843  BP: 123/84 111/71  (!) 160/142  Pulse: (!) 121 100  (!) 129  Resp: (!) 22 (!) 43  (!) 37  Temp: (!) 96.4 F (35.8 C) 97.7 F (36.5 C)  (!) 97.3 F (36.3 C)  TempSrc: Axillary Oral  Axillary  SpO2: 100% 91%  92%  Weight:   103.3 kg   Height:        Intake/Output Summary (Last 24 hours) at 07/02/2024 1007 Last data filed at 07/02/2024 0855 Gross per 24 hour  Intake 1032.27 ml  Output 1875 ml  Net -842.73 ml   Filed Weights   06/26/24 2317 07/01/24 0904 07/02/24 0500  Weight: 91.6 kg 107.6 kg 103.3 kg    Examination:  Physical Exam Vitals and nursing note reviewed.  Constitutional:      General: She is not in acute distress.    Appearance: She is obese. She is ill-appearing (chronically).     Comments: Weak, frail  HENT:     Head: Normocephalic and atraumatic.  Cardiovascular:     Rate and Rhythm: Normal rate and regular rhythm.     Pulses: Normal pulses.     Heart sounds: Normal heart sounds.  Pulmonary:     Effort: Pulmonary effort is normal.     Breath sounds: Normal breath sounds.  Abdominal:      General: Bowel sounds are normal.     Palpations: Abdomen is soft.  Neurological:     Mental Status: She is alert.     Data Reviewed: I have personally reviewed following labs and imaging studies  CBC: Recent Labs  Lab 06/28/24 0531 06/28/24 1932 06/29/24 0554 06/30/24 0555 06/30/24 1523 07/01/24 1431  WBC 30.4* 31.2* 25.2* 16.8* 39.0*  40.5* 26.0*  NEUTROABS 28.8*  --  23.3* 14.9* 35.9*  --   HGB 10.5* 10.9* 11.0* 11.0* 10.2*  10.1* 10.2*  HCT 30.8* 33.2* 32.6* 34.2* 32.2*  31.5* 32.0*  MCV 90.1 92.0 89.6 91.7 94.4  92.9 92.2  PLT 260 310 304 275 235  247 203   Basic Metabolic Panel: Recent Labs  Lab 06/27/24 0705 06/27/24 1138 06/27/24 1514 06/28/24 0531 06/29/24 0554 06/30/24 0555 06/30/24 1144 06/30/24 1523 07/01/24 0500  NA 132*  --    < > 137 139 142  --  139 140  K 4.7  --    < > 4.1 3.7 3.2*  --  3.7 4.1  CL 98  --    < > 96* 95* 98  --  95* 99  CO2 19*  --    < > 17* 19* 26  --  21* 25  GLUCOSE 170*  --    < > 226* 248* 136*  --  255* 147*  BUN 59*  --    < > 69* 75* 79*  --  77* 83*  CREATININE 3.36*  --    < > 3.68* 3.40* 2.89*  --  3.02* 2.90*  CALCIUM  8.7*  --    < > 8.4* 8.4* 9.1  --  8.9 9.2  MG 3.2* 3.1*  --   --   --   --  2.0  --   --   PHOS  --  5.7*  --   --   --   --   --   --   --    < > = values in this interval not displayed.   GFR: Estimated Creatinine Clearance: 21.8 mL/min (A) (by C-G formula based on SCr of 2.9 mg/dL (H)). Liver Function Tests: Recent Labs  Lab 06/27/24 1514 06/28/24 0531 06/29/24 0554 06/30/24 0555 07/01/24 0500  AST 6,589* 4,736* 1,888* 755* 410*  ALT 2,684* 2,637* 2,069* 1,438* 1,037*  ALKPHOS 189* 184* 249* 208* 196*  BILITOT 1.0 1.0 1.1 0.9 0.8  PROT 6.0* 5.7* 5.9* 5.8* 5.9*  ALBUMIN  3.0* 2.9* 2.8* 2.7* 2.9*   No results for input(s): LIPASE, AMYLASE in the last 168 hours. No results for input(s): AMMONIA in the last 168 hours. Coagulation Profile: Recent Labs  Lab 06/26/24 2323  INR  2.4*   Cardiac Enzymes: Recent Labs  Lab 06/27/24 0002 06/27/24 0705  CKTOTAL 385* 894*   ProBNP, BNP (last 5 results) Recent Labs    01/01/24 0851 04/15/24 1032 06/17/24 0135 06/27/24 0002 06/27/24 0705  PROBNP  --   --   --  8,463.0* 32,843.0*  BNP 311.3* 179.8* 567.8*  --   --    HbA1C: No results for input(s): HGBA1C in the last 72 hours. CBG: Recent Labs  Lab 06/26/24 0425  GLUCAP 97   Lipid Profile: No results for input(s): CHOL, HDL, LDLCALC, TRIG, CHOLHDL, LDLDIRECT in the last 72 hours. Thyroid  Function Tests: No results for input(s): TSH, T4TOTAL, FREET4, T3FREE, THYROIDAB in the last 72 hours. Anemia Panel: No results for input(s): VITAMINB12, FOLATE, FERRITIN, TIBC, IRON, RETICCTPCT in the last 72 hours. Sepsis Labs: Recent Labs  Lab 06/27/24 1138 06/27/24 1514 06/29/24 0500 06/30/24 1201 06/30/24 1411 07/01/24 0500  PROCALCITON 16.00  --  8.49  --   --   --   LATICACIDVEN  --  1.7  --  2.2* 2.8* 1.2    Recent Results (from the past 240 hours)  Resp panel by RT-PCR (RSV, Flu A&B, Covid) Anterior Nasal Swab     Status: Abnormal   Collection Time: 06/27/24 12:05 AM   Specimen: Anterior Nasal Swab  Result Value Ref Range Status   SARS Coronavirus 2 by RT PCR NEGATIVE NEGATIVE Final   Influenza A by PCR POSITIVE (A) NEGATIVE Final   Influenza B by PCR NEGATIVE NEGATIVE Final    Comment: (NOTE) The Xpert Xpress SARS-CoV-2/FLU/RSV plus assay is intended as an aid in the diagnosis of influenza from Nasopharyngeal swab specimens and should not be used as a sole basis for treatment. Nasal  washings and aspirates are unacceptable for Xpert Xpress SARS-CoV-2/FLU/RSV testing.  Fact Sheet for Patients: bloggercourse.com  Fact Sheet for Healthcare Providers: seriousbroker.it  This test is not yet approved or cleared by the [place]  FDA and has been authorized for  detection and/or diagnosis of SARS-CoV-2 by FDA under an Emergency Use Authorization (EUA). This EUA will remain in effect (meaning this test can be used) for the duration of the COVID-19 declaration under Section 564(b)(1) of the Act, 21 U.S.C. section 360bbb-3(b)(1), unless the authorization is terminated or revoked.     Resp Syncytial Virus by PCR NEGATIVE NEGATIVE Final    Comment: (NOTE) Fact Sheet for Patients: bloggercourse.com  Fact Sheet for Healthcare Providers: seriousbroker.it  This test is not yet approved or cleared by the [place]  FDA and has been authorized for detection and/or diagnosis of SARS-CoV-2 by FDA under an Emergency Use Authorization (EUA). This EUA will remain in effect (meaning this test can be used) for the duration of the COVID-19 declaration under Section 564(b)(1) of the Act, 21 U.S.C. section 360bbb-3(b)(1), unless the authorization is terminated or revoked.  Performed at [facility], [address] [place], [place] [address]   Urine Culture     Status: Abnormal   Collection Time: 06/27/24  6:20 AM   Specimen: Urine, Clean Catch  Result Value Ref Range Status   Specimen Description URINE, CLEAN CATCH  Final   Special Requests   Final    NONE Performed at [facility], [address], [place], [place] [address]    Culture >=100,000 COLONIES/mL ENTEROCOCCUS FAECALIS (A)  Final   Report Status 07/01/2024 FINAL  Final   Organism ID, Bacteria ENTEROCOCCUS FAECALIS (A)  Final      Susceptibility   Enterococcus faecalis - MIC*    AMPICILLIN <=2 SENSITIVE Sensitive     NITROFURANTOIN <=16 SENSITIVE Sensitive     VANCOMYCIN  1 SENSITIVE Sensitive     * >=100,000 COLONIES/mL ENTEROCOCCUS FAECALIS  Culture, blood (Routine X 2) w Reflex to ID Panel     Status: None   Collection Time: 06/27/24  6:57 AM   Specimen: BLOOD LEFT HAND  Result Value Ref Range Status   Specimen  Description BLOOD LEFT HAND  Final   Special Requests   Final    BOTTLES DRAWN AEROBIC ONLY Blood Culture results may not be optimal due to an inadequate volume of blood received in culture bottles   Culture   Final    NO GROWTH 5 DAYS Performed at [facility], [address] [place], [place] [address]    Report Status 07/02/2024 FINAL  Final  Culture, blood (Routine X 2) w Reflex to ID Panel     Status: None   Collection Time: 06/27/24  7:45 AM   Specimen: BLOOD  Result Value Ref Range Status   Specimen Description BLOOD RIGHT ANTECUBITAL  Final   Special Requests   Final    BOTTLES DRAWN AEROBIC AND ANAEROBIC Blood Culture adequate volume   Culture   Final    NO GROWTH 5 DAYS Performed at [facility], [address] [place], [place] [address]    Report Status 07/02/2024 FINAL  Final  MRSA Next Gen by PCR, Nasal     Status: None   Collection Time: 06/27/24 11:38 AM   Specimen: Nasal Mucosa; Nasal Swab  Result Value Ref Range Status   MRSA by PCR Next Gen NOT DETECTED NOT DETECTED Final    Comment: (  NOTE) The GeneXpert MRSA Assay (FDA approved for NASAL specimens only), is one component of a comprehensive MRSA colonization surveillance program. It is not intended to diagnose MRSA infection nor to guide or monitor treatment for MRSA infections. Test performance is not FDA approved in patients less than 68 years old. Performed at [facility], [address], [place], [place] [address]   Aerobic/Anaerobic Culture w Gram Stain (surgical/deep wound)     Status: None (Preliminary result)   Collection Time: 06/30/24 10:34 AM   Specimen: Path fluid; Body Fluid  Result Value Ref Range Status   Specimen Description FLUID LEFT KIDNEY  Final   Special Requests NONE  Final   Gram Stain RARE WBC SEEN NO ORGANISMS SEEN   Final   Culture   Final    NO GROWTH 2 DAYS Performed at [facility], [address], [place], [place] [address]    Report Status  PENDING  Incomplete     Radiology Studies: MR LUMBAR SPINE WO CONTRAST Result Date: 07/02/2024 EXAM: MRI LUMBAR SPINE 07/02/2024 12:53:13 AM TECHNIQUE: Multiplanar multisequence MRI of the lumbar spine was performed without the administration of intravenous contrast. COMPARISON: None available. CLINICAL HISTORY: Concern for osteomyelitis. Severe pain. FINDINGS: BONES AND ALIGNMENT: Mild dextroscoliosis of the thoracolumbar spine. Grade 1 degenerative anterolisthesis present at L3-L4 and L4-L5. Normal vertebral body heights. Bone marrow signal is unremarkable. Mild facet hypertrophic changes. SPINAL CORD: The conus terminates normally. SOFT TISSUES: No paraspinal mass. T12-L1: Central disc bulge causing mild central spinal canal stenosis. L1-L2: No significant disc herniation. No spinal canal stenosis or neural foraminal narrowing. L2-L3: No significant disc herniation. No spinal canal stenosis or neural foraminal narrowing. L3-L4: Grade 1 degenerative anterolisthesis with mild-to-moderate central spinal canal stenosis. L4-L5: Grade 1 anterolisthesis with mild central spinal canal stenosis and moderate right neural foraminal stenosis. L5-S1: Diffuse disc bulging and endplate ridging, but no significant spinal canal or neural foraminal stenosis. IMPRESSION: 1. Mild dextroscoliosis of the thoracolumbar spine. 2. Grade 1 degenerative anterolisthesis at L3-4 and L4-5. 3. Mild central spinal canal stenosis and moderate right neural foraminal stenosis at L4-5. 4. Mild-to-moderate central spinal canal stenosis at L3-4. 5. Mild central spinal canal stenosis at T12-L1. Electronically signed by: Evalene Coho MD 07/02/2024 06:07 AM EST RP Workstation: [id number]   DG C-Arm 1-60 Min Result Date: 06/30/2024 CLINICAL DATA:  Cystoscopy.  Left ureteral stent placement. EXAM: DG C-ARM 1-60 MIN CONTRAST:  Not provided. FLUOROSCOPY: Fluoroscopy Time:  1 minute 41 seconds Radiation Exposure Index (if provided by the  fluoroscopic device): 48.17 mGy Number of Acquired Spot Images: 5 COMPARISON:  CT 06/28/2024 FINDINGS: Five fluoroscopic spot views submitted from the operating/procedure room. Contrast opacifies the renal collecting system with placement of ureteral stent. IMPRESSION: Procedural fluoroscopy during cystoscopy. Please reference procedure report for details. Electronically Signed   By: Andrea Gasman M.D.   On: 06/30/2024 17:17   DG C-Arm 1-60 Min-No Report Result Date: 06/30/2024 Fluoroscopy was utilized by the requesting physician.  No radiographic interpretation.   DG C-Arm 1-60 Min-No Report Result Date: 06/30/2024 Fluoroscopy was utilized by the requesting physician.  No radiographic interpretation.    Scheduled Meds:  apixaban   5 mg Oral BID   brimonidine   1 drop Right Eye BID   And   timolol   1 drop Right Eye BID   Chlorhexidine  Gluconate Cloth  6 each Topical Daily   diclofenac  Sodium  2 g Topical TID AC & HS   FLUoxetine   40 mg  Oral Daily   guaiFENesin   600 mg Oral BID   latanoprost   1 drop Right Eye QHS   levothyroxine   112 mcg Oral q morning   lidocaine   1 patch Transdermal QHS   oseltamivir   30 mg Oral Daily   oxyCODONE   2.5 mg Oral Once   sodium bicarbonate   325 mg Oral BID   sodium chloride  flush  10-40 mL Intracatheter Q12H   Continuous Infusions:  amiodarone  30 mg/hr (07/02/24 0133)   micafungin  (MYCAMINE ) 100 mg in sodium chloride  0.9 % 100 mL IVPB 100 mg (07/01/24 0956)   piperacillin -tazobactam (ZOSYN )  IV 3.375 g (07/02/24 0528)     LOS: 5 days    Norval Bar, MD  [facility]  07/02/2024, 10:07 AM   "

## 2024-07-03 ENCOUNTER — Inpatient Hospital Stay (HOSPITAL_COMMUNITY)

## 2024-07-03 DIAGNOSIS — R6521 Severe sepsis with septic shock: Secondary | ICD-10-CM | POA: Diagnosis not present

## 2024-07-03 DIAGNOSIS — A419 Sepsis, unspecified organism: Secondary | ICD-10-CM | POA: Diagnosis not present

## 2024-07-03 DIAGNOSIS — R7401 Elevation of levels of liver transaminase levels: Secondary | ICD-10-CM | POA: Diagnosis not present

## 2024-07-03 DIAGNOSIS — R7989 Other specified abnormal findings of blood chemistry: Secondary | ICD-10-CM | POA: Diagnosis not present

## 2024-07-03 DIAGNOSIS — J101 Influenza due to other identified influenza virus with other respiratory manifestations: Secondary | ICD-10-CM | POA: Diagnosis not present

## 2024-07-03 DIAGNOSIS — J441 Chronic obstructive pulmonary disease with (acute) exacerbation: Secondary | ICD-10-CM | POA: Diagnosis not present

## 2024-07-03 DIAGNOSIS — I5043 Acute on chronic combined systolic (congestive) and diastolic (congestive) heart failure: Secondary | ICD-10-CM | POA: Diagnosis not present

## 2024-07-03 DIAGNOSIS — N179 Acute kidney failure, unspecified: Secondary | ICD-10-CM

## 2024-07-03 DIAGNOSIS — I4819 Other persistent atrial fibrillation: Secondary | ICD-10-CM | POA: Diagnosis not present

## 2024-07-03 DIAGNOSIS — J9621 Acute and chronic respiratory failure with hypoxia: Secondary | ICD-10-CM | POA: Diagnosis not present

## 2024-07-03 DIAGNOSIS — J690 Pneumonitis due to inhalation of food and vomit: Secondary | ICD-10-CM | POA: Diagnosis not present

## 2024-07-03 DIAGNOSIS — I5020 Unspecified systolic (congestive) heart failure: Secondary | ICD-10-CM | POA: Diagnosis not present

## 2024-07-03 LAB — BASIC METABOLIC PANEL WITH GFR
Anion gap: 11 (ref 5–15)
BUN: 70 mg/dL — ABNORMAL HIGH (ref 8–23)
CO2: 26 mmol/L (ref 22–32)
Calcium: 9 mg/dL (ref 8.9–10.3)
Chloride: 104 mmol/L (ref 98–111)
Creatinine, Ser: 1.91 mg/dL — ABNORMAL HIGH (ref 0.44–1.00)
GFR, Estimated: 28 mL/min — ABNORMAL LOW
Glucose, Bld: 212 mg/dL — ABNORMAL HIGH (ref 70–99)
Potassium: 4.2 mmol/L (ref 3.5–5.1)
Sodium: 141 mmol/L (ref 135–145)

## 2024-07-03 LAB — CBC
HCT: 35.2 % — ABNORMAL LOW (ref 36.0–46.0)
Hemoglobin: 10.9 g/dL — ABNORMAL LOW (ref 12.0–15.0)
MCH: 29.8 pg (ref 26.0–34.0)
MCHC: 31 g/dL (ref 30.0–36.0)
MCV: 96.2 fL (ref 80.0–100.0)
Platelets: 193 K/uL (ref 150–400)
RBC: 3.66 MIL/uL — ABNORMAL LOW (ref 3.87–5.11)
RDW: 17.1 % — ABNORMAL HIGH (ref 11.5–15.5)
WBC: 15.8 K/uL — ABNORMAL HIGH (ref 4.0–10.5)
nRBC: 0.5 % — ABNORMAL HIGH (ref 0.0–0.2)

## 2024-07-03 LAB — HEPATIC FUNCTION PANEL
ALT: 564 U/L — ABNORMAL HIGH (ref 0–44)
AST: 182 U/L — ABNORMAL HIGH (ref 15–41)
Albumin: 2.8 g/dL — ABNORMAL LOW (ref 3.5–5.0)
Alkaline Phosphatase: 194 U/L — ABNORMAL HIGH (ref 38–126)
Bilirubin, Direct: 0.3 mg/dL — ABNORMAL HIGH (ref 0.0–0.2)
Indirect Bilirubin: 0.4 mg/dL (ref 0.3–0.9)
Total Bilirubin: 0.7 mg/dL (ref 0.0–1.2)
Total Protein: 5.9 g/dL — ABNORMAL LOW (ref 6.5–8.1)

## 2024-07-03 LAB — HEMOGLOBIN A1C
Hgb A1c MFr Bld: 6.3 % — ABNORMAL HIGH (ref 4.8–5.6)
Mean Plasma Glucose: 134.11 mg/dL

## 2024-07-03 LAB — ACID FAST SMEAR (AFB, MYCOBACTERIA): Acid Fast Smear: NEGATIVE

## 2024-07-03 LAB — GLUCOSE, CAPILLARY
Glucose-Capillary: 255 mg/dL — ABNORMAL HIGH (ref 70–99)
Glucose-Capillary: 248 mg/dL — ABNORMAL HIGH (ref 70–99)

## 2024-07-03 MED ORDER — INSULIN ASPART 100 UNIT/ML IJ SOLN
0.0000 [IU] | Freq: Three times a day (TID) | INTRAMUSCULAR | Status: DC
  Administered 2024-07-03: 5 [IU] via SUBCUTANEOUS
  Administered 2024-07-04: 2 [IU] via SUBCUTANEOUS
  Administered 2024-07-04: 3 [IU] via SUBCUTANEOUS
  Administered 2024-07-04: 5 [IU] via SUBCUTANEOUS
  Administered 2024-07-05: 2 [IU] via SUBCUTANEOUS
  Administered 2024-07-05 – 2024-07-06 (×3): 1 [IU] via SUBCUTANEOUS
  Administered 2024-07-06 (×2): 2 [IU] via SUBCUTANEOUS
  Administered 2024-07-07: 7 [IU] via SUBCUTANEOUS
  Administered 2024-07-07 (×2): 2 [IU] via SUBCUTANEOUS
  Administered 2024-07-08 (×2): 1 [IU] via SUBCUTANEOUS
  Administered 2024-07-08: 2 [IU] via SUBCUTANEOUS
  Filled 2024-07-03: qty 5
  Filled 2024-07-03: qty 3
  Filled 2024-07-03 (×3): qty 1
  Filled 2024-07-03 (×2): qty 2
  Filled 2024-07-03: qty 5
  Filled 2024-07-03 (×4): qty 2
  Filled 2024-07-03: qty 7
  Filled 2024-07-03: qty 2
  Filled 2024-07-03: qty 1
  Filled 2024-07-03: qty 2

## 2024-07-03 MED ORDER — METHYLPREDNISOLONE SODIUM SUCC 40 MG IJ SOLR
40.0000 mg | Freq: Two times a day (BID) | INTRAMUSCULAR | Status: DC
  Administered 2024-07-03 – 2024-07-09 (×12): 40 mg via INTRAVENOUS
  Filled 2024-07-03 (×12): qty 1

## 2024-07-03 MED ORDER — IPRATROPIUM-ALBUTEROL 0.5-2.5 (3) MG/3ML IN SOLN
3.0000 mL | Freq: Four times a day (QID) | RESPIRATORY_TRACT | Status: DC
  Administered 2024-07-04 – 2024-07-06 (×8): 3 mL via RESPIRATORY_TRACT
  Filled 2024-07-03 (×9): qty 3

## 2024-07-03 MED ORDER — OXYCODONE HCL 5 MG PO TABS
5.0000 mg | ORAL_TABLET | Freq: Four times a day (QID) | ORAL | Status: DC | PRN
  Administered 2024-07-03 – 2024-07-08 (×12): 5 mg via ORAL
  Filled 2024-07-03 (×13): qty 1

## 2024-07-03 MED ORDER — SODIUM CHLORIDE 0.9 % IV SOLN
100.0000 mg | INTRAVENOUS | Status: DC
  Administered 2024-07-04 – 2024-07-08 (×5): 100 mg via INTRAVENOUS
  Filled 2024-07-03 (×6): qty 5

## 2024-07-03 MED ORDER — TRIMETHOBENZAMIDE HCL 100 MG/ML IM SOLN
200.0000 mg | Freq: Four times a day (QID) | INTRAMUSCULAR | Status: DC | PRN
  Administered 2024-07-04: 200 mg via INTRAMUSCULAR
  Filled 2024-07-03 (×2): qty 2

## 2024-07-03 MED ORDER — IPRATROPIUM-ALBUTEROL 0.5-2.5 (3) MG/3ML IN SOLN: 3.0000 mL | Freq: Four times a day (QID) | RESPIRATORY_TRACT | Status: DC

## 2024-07-03 MED ORDER — AMOXICILLIN-POT CLAVULANATE 875-125 MG PO TABS
1.0000 | ORAL_TABLET | Freq: Two times a day (BID) | ORAL | Status: DC
  Administered 2024-07-03 – 2024-07-05 (×6): 1 via ORAL
  Filled 2024-07-03 (×6): qty 1

## 2024-07-03 MED ORDER — BUDESONIDE 0.5 MG/2ML IN SUSP
0.5000 mg | Freq: Two times a day (BID) | RESPIRATORY_TRACT | Status: DC
  Administered 2024-07-04 – 2024-07-08 (×10): 0.5 mg via RESPIRATORY_TRACT
  Filled 2024-07-03 (×11): qty 2

## 2024-07-03 MED ORDER — INSULIN ASPART 100 UNIT/ML IJ SOLN
0.0000 [IU] | Freq: Every day | INTRAMUSCULAR | Status: DC
  Administered 2024-07-03: 2 [IU] via SUBCUTANEOUS
  Filled 2024-07-03: qty 3

## 2024-07-03 NOTE — Progress Notes (Addendum)
 "  Progress Note  Patient Name: Lauren Clark Date of Encounter: 07/03/2024 [facility] Cardiologist: Vinie JAYSON Maxcy, MD   Interval Summary   Denies any new symptoms over night. Was wanting to sit by the side of the bed. The NT was at the bedside addressing the patients concerns.  Vital Signs Vitals:   07/03/24 0301 07/03/24 0320 07/03/24 0522 07/03/24 0526  BP:   108/85   Pulse: (!) 133 94 (!) 125 (!) 123  Resp: (!) 28 (!) 30 (!) 31 16  Temp:      TempSrc:      SpO2: (!) 88% 96% 92% 93%  Weight:    102.9 kg  Height:        Intake/Output Summary (Last 24 hours) at 07/03/2024 0734 Last data filed at 07/03/2024 0500 Gross per 24 hour  Intake 180 ml  Output 1100 ml  Net -920 ml      07/03/2024    5:26 AM 07/02/2024    5:00 AM 07/01/2024    9:04 AM  Last 3 Weights  Weight (lbs) 226 lb 13.7 oz 227 lb 11.8 oz 237 lb 3.4 oz  Weight (kg) 102.9 kg 103.3 kg 107.6 kg      Telemetry/ECG  Atrial fibrillation with heart rates in the 90-120's - Personally Reviewed  Physical Exam  GEN: No acute distress.   Neck: No JVD Cardiac: Irregularly irregular rhythm, no murmurs, rubs, or gallops.  Respiratory: Wheezing heard throughout the lungs. GI: Soft, nontender, non-distended  MS: No edema  Assessment & Plan  Lauren Clark is a 68 y.o. female with HFpEF, CKD 3B, paroxysmal AF, morbid obesity s/p gastric sleeve, prior DVT, HTN, HLD, TAA, hypothyroidism, nonobstructive CAD who presents with ADHF in the setting of flu A+ and newly reduced LVEF (55-60%->30-35%) consistent with possible stress-induced cardiomyopathy.     Septic shock UTI Flu A Multiple organ failure AKI On admission there were concerns for septic shock, and leukocytosis.  The patient was also positive for flu A.  CT of the abdomen found a density in the left send the upper pole calyx. 06/30/2024 the patient underwent a cystoscopy and was found to have necrotic tissue in her bladder and kidney.  Cultures are  pending.  Urology is considering getting a follow-up cystoscopy. She is currently on Zosyn  and Mycamine .  Because of concerns for a fungal UTI. Creatinine peaked at about 3.68.  Creatinine on 07/01/2024 was 2.90.  Baseline appears to be about 1.0-1.3. Management per urology, primary, and ID.     Acute HFrEF Combined diastolic and systolic heart failure. TTE on 06/27/2024 showed a reduced LVEF of 30 to 35%, apical akinesis, G2 DD, elevated LAP, normal RV systolic function, moderate AR, and IVC with less than 50% respiratory variability. Previous TTE on 05/2024 showed a normal LVEF of 55 to 60%. Given overall clinical picture mentioned above was previously suspected that the acute decline in systolic function is stress cardiomyopathy. I's and O's are net out 2.1 L since admission. Order Daily weights GDMT is limited by soft blood pressures and AKI.  Creatinine on 07/01/2024 was 2.9.  Labs for this morning are still pending. Plan to recheck echo in 2 to 3 months.  May consider ischemic evaluation at that time if LVEF remains reduced. Will avoid SGLT2 due to concerns of fungal UTI.     Elevated high-sensitivity troponins 157 > 323 > 365 > 77 > 69 Suspect is secondary to demand ischemia from septic shock  Persistent A-fib with RVR Heart rates are in the 100s to 110s.  Rate control is limited by hypotension.  Most recent BP at about 4 AM this morning was 111/71.  Will manage A-fib rate control less aggressively due to concerns for sepsis. Continue IV amiodarone . Continue metoprolol  tartrate 12.5 mg every 6 hours.     Elevated LFTs ALT peaked at 2684.  They are downtrending. Management per primary     Otherwise management per primary    For questions or updates, please contact [facility] Please consult [email or web address] for contact info under        Signed, Morse Clause, PA-C   History and all data above reviewed.  I personally took the history today, performed the  physical exam and formulated the substantive portion of the assessment and plan.  I reviewed all relevant tests and studies. She is more SOB this AM.  No new chest pain.  Rate unchanged. Patient examined.  The patient exam reveals RNM:Pmmzhlojm   ,  Lungs: Diffuse wheezing  ,  Abd: Positive bowel sounds, no rebound no guarding, Ext Mild edema   .  All available labs, radiology testing, previous records reviewed. Agree with documented assessment and plan. Atrial fib:  Back on Eliquis .  I would suggest continue the IV amio again today.  Limited on up titrating other meds for rate control with lower BP.  Otherwise she needs continued supportive therapies and pain control for HR management.  SOB:  Wheezing increased today.  I messaged the primary team and talked to the nurse about giving albuterol  nebulizer.  She is net negative and I am not convinced that her SOB is volume.  I am leery of aggressive diuresis with her recent AKI.    Lauren Clark  12:10 PM  07/03/2024  "

## 2024-07-03 NOTE — Plan of Care (Signed)
  Problem: Clinical Measurements: Goal: Cardiovascular complication will be avoided Outcome: Progressing   Problem: Activity: Goal: Risk for activity intolerance will decrease Outcome: Progressing   Problem: Pain Managment: Goal: General experience of comfort will improve and/or be controlled Outcome: Progressing   Problem: Safety: Goal: Ability to remain free from injury will improve Outcome: Progressing

## 2024-07-03 NOTE — Progress Notes (Signed)
 Physical Therapy Treatment Patient Details Name: Lauren Clark MRN: [id number] DOB: October 01, 1955 Today's Date: 07/03/2024   History of Present Illness 68 y.o. female presents to [facility] 06/26/24 on same day as d/c from hospital due to worsening SOB and fall. Trauma scans negative. Admitted with acute COPD exacerbation 2/2 influenza A, UTI+, acute on chronic hypoxemic respiratory failure. CT abdomen showed L hydroureteronephrosis, high density material in L kidney, lamellated stone in bladder, and cholelithiasis. 12/28 s/p cystoscopy and ureteroscopy w/ L uretal stent placement and retrieval of L renal mass and bladder mass. PMH anemia, anxiety, aortic dissection COPD, arthritis, depression, glaucoma, blind L eye, limited vision r eye, obese, PVD, gastric sleeve, R THA, HTN, HLD, CKD III,CAD, afib on eliquis , HFpEF, aortic aneurysm.   PT Comments  Pt received in supine and eager to get out of bed. Pt progressed in today's session by being able to transfer to the recliner. Pt required MinA for bed mobility and ModA to stand with use of RW. Able to then step-pivot to the right with MinA +2 for safety. Pt was fatigued after transferring with rest breaks taken throughout. Continue to recommend <3hrs post acute rehab with acute PT to follow.    93% SpO2 on 4.5L   If plan is discharge home, recommend the following: A lot of help with walking and/or transfers;A lot of help with bathing/dressing/bathroom;Assistance with cooking/housework;Assist for transportation;Help with stairs or ramp for entrance   Can travel by private vehicle     No  Equipment Recommendations  Wheelchair (measurements PT);Wheelchair cushion (measurements PT);BSC/3in1       Precautions / Restrictions Precautions Precautions: Fall Recall of Precautions/Restrictions: Intact Precaution/Restrictions Comments: watch O2, blind L eye w/ limited vision R eye Restrictions Weight Bearing Restrictions Per Provider Order: No     Mobility  Bed  Mobility Overal bed mobility: Needs Assistance Bed Mobility: Supine to Sit    Supine to sit: HOB elevated, Used rails, Min assist    General bed mobility comments: slight assist to guide BLE off EOB with MinA to raise trunk. Increased time and effort to scoot forwards towards EOB    Transfers Overall transfer level: Needs assistance Equipment used: Rolling walker (2 wheels) Transfers: Sit to/from Stand, Bed to chair/wheelchair/BSC Sit to Stand: Mod assist   Step pivot transfers: Min assist, +2 safety/equipment     General transfer comment: ModA for boost-up from EOB. Able to then step-pivot to the right with MinA +2 for safety.    Ambulation/Gait Ambulation/Gait assistance: Min assist Gait Distance (Feet): 4 Feet Assistive device: Rolling walker (2 wheels) Gait Pattern/deviations: Step-through pattern, Decreased stride length, Trunk flexed Gait velocity: decreased    General Gait Details: Heavy reliance on BUE support with shuffling steps towards recliner     Balance Overall balance assessment: Needs assistance Sitting-balance support: Feet supported, Single extremity supported Sitting balance-Leahy Scale: Fair     Standing balance support: During functional activity, Reliant on assistive device for balance, Bilateral upper extremity supported Standing balance-Leahy Scale: Poor Standing balance comment: reliant on BUE and external support         Communication Communication Communication: No apparent difficulties  Cognition Arousal: Alert Behavior During Therapy: WFL for tasks assessed/performed   PT - Cognitive impairments: No apparent impairments      Following commands: Intact      Cueing Cueing Techniques: Verbal cues  Exercises General Exercises - Lower Extremity Hip Flexion/Marching: AROM, Both, 5 reps, Seated        Pertinent Vitals/Pain Pain Assessment  Pain Assessment: Faces Faces Pain Scale: Hurts little more Pain Location: all over Pain  Descriptors / Indicators: Discomfort, Grimacing Pain Intervention(s): Limited activity within patient's tolerance, Monitored during session, Repositioned     PT Goals (current goals can now be found in the care plan section) Acute Rehab PT Goals Patient Stated Goal: to get out of the bed PT Goal Formulation: With patient Time For Goal Achievement: 07/12/24 Potential to Achieve Goals: Good Progress towards PT goals: Progressing toward goals    Frequency    Min 2X/week       AM-PAC PT 6 Clicks Mobility   Outcome Measure  Help needed turning from your back to your side while in a flat bed without using bedrails?: A Little Help needed moving from lying on your back to sitting on the side of a flat bed without using bedrails?: A Lot Help needed moving to and from a bed to a chair (including a wheelchair)?: A Lot Help needed standing up from a chair using your arms (e.g., wheelchair or bedside chair)?: A Lot Help needed to walk in hospital room?: Total Help needed climbing 3-5 steps with a railing? : Total 6 Click Score: 11    End of Session Equipment Utilized During Treatment: Oxygen;Gait belt Activity Tolerance: Patient limited by fatigue Patient left: in chair;with call bell/phone within reach;with chair alarm set Nurse Communication: Mobility status PT Visit Diagnosis: Unsteadiness on feet (R26.81);Other abnormalities of gait and mobility (R26.89);Muscle weakness (generalized) (M62.81)     Time: 1320-1340 PT Time Calculation (min) (ACUTE ONLY): 20 min  Charges:    $Therapeutic Activity: 8-22 mins PT General Charges $$ ACUTE PT VISIT: 1 Visit                    Kate ORN, PT, DPT Secure Chat Preferred  Rehab Office [phone]  Kate BRAVO Lauren Clark 07/03/2024, 2:58 PM

## 2024-07-03 NOTE — Plan of Care (Signed)

## 2024-07-03 NOTE — Plan of Care (Signed)
 RN reported that patient is still having nausea even received Compazine . Per chart review EKG showing prolonged QTc 580 ms.  As Compazine  is not helpful changing to IV Tigan as needed in the setting of prolonged QTc interval.   Vaughan Garfinkle, MD [facility] 07/03/2024, 11:54 PM

## 2024-07-03 NOTE — Progress Notes (Signed)
 I will stop by on Friday morning and talk to the patient about a plan for follow up ureteroscopy either in the next few days or in the coming weeks. Fortunately she is improving. I am concerned about her cardiac status and hoping to get her a little bit further out from the her current hospitalization so that she can recover and get stronger. However, if necessary, Id be glad to do it sooner. I will check with her Friday morning.

## 2024-07-03 NOTE — Progress Notes (Signed)
 "  [facility] PROGRESS NOTE   Lauren Clark FMW:[id number] DOB: 1956/01/24 DOA: 06/26/2024  PCP: [name], MD  Brief History: 68 y.o. female with medical history significant of COPD, chronic hypoxemic respiratory failure on 2 L home oxygen, CAD, A-fib on Eliquis , HFpEF, ascending aortic aneurysm followed by cardiothoracic surgery, hypertension, hyperlipidemia, hypothyroidism, CKD stage IIIa, anemia, anxiety, depression, GERD, glaucoma, vision impairment, morbid obesity status post gastric sleeve surgery, PVD.  Recent hospital admission 12/13-12/24 for AKI, pyelonephritis, ileus, mildly elevated LFTs, and acute on chronic hypoxemic respiratory failure secondary to CHF exacerbation. Creatinine had peaked to 3.86.  Her diuretics were held and she was given IV albumin .  Creatinine improved to 2.97 at the time of discharge and nephrology resumed Lasix ..  Completed 7 days of Rocephin  for pyelonephritis.  She was evaluated by physical therapy and discharged with home health PT/OT afternoon.  Patient returned to the ED via EMS due to concern for shortness of breath, fall at home, and possible syncope. Admitted for acute on chronic hypoxic respiratory failure, Acute influenza PNA with superimposed bacterial PNA, acute COPD exacerbation, NSTEMI, acute CHF exacerbation, prolonged Qtc, Hypotension, acute liver failure, and acute on chronic kidney disease.   Consultants: Cardiology.  Infectious disease.  Urology.  Palliative care  Procedures:  Cystoscopy with cystolitholopaxy of 7 cm bladder collection/foreign body.  Ureteroscopy with laser destruction of lesion of upper and lower pole of left kidney with Dr. Cam on 12/28     Subjective/Interval History: Patient complains of shortness of breath this morning.  Denies any chest pain.  Some nausea but no vomiting.    Assessment/Plan:  Acute on chronic hypoxemic respiratory failure Patient was noted to be saturating in the 80s on her home 2  L [place] with EMS.  She was wheezing with EMS and was given albuterol  treatment. She is complaining of shortness of breath this morning.  Chest x-ray done yesterday did not show any acute findings. Symptoms appear to be somewhat chronic. Continue nebulizer treatments.  Monitor oxygen saturation levels.  She is in atrial fibrillation with RVR and could have some degree of volume overload.  We could give her diuretics. Blood gas without evidence of hypercapnia.     Possible acute on chronic HFpEF Hypotension CXR with no any acute findings but stable cardiomegaly.  Tachyarrhythmia could be causing some volume overload. Echocardiogram does show LVEF of 30 to 35%.  Grade 2 diastolic dysfunction noted. Cardiology is following. Patient was given furosemide  once in the emergency department and then again on 12/26 but not continued due to AKI. Creatinine has improved.   Sepsis Acute influenza A infection Possible bacterial PNA Acute complicated UTI Bladder/kidney lesions Recently finished 7-day course of Rocephin  for pyelonephritis. Prior UCx with polymicrobial growth at that time UA continues to show evidence of pyuria and bacteria. Ucx with ampicillin-sensitive E. Faecalis Blood culture without any growth. Influenza A PCR positive.  MRSA negative CXR with no any acute findings but stable cardiomegaly CT chest w/o contrast showed interval development of patchy ground-glass opacity with interlobular septal thickening in both upper lobes, L > R. Confluent areas of consolidative airspace disease are seen in a patchy distribution of the mid and lower lungs bilaterally. Imaging features are compatible with multifocal pneumonia. CT abdomen pelvis w/o contrast showed mild L hydroureteronephrosis without stone, high density material at upper pole calyx in L kidney that maybe blood product/infectious lesion/neoplasm, lamellated stone with internal gas in urinary bladder, cholelithiasis  Status post  ureterocystoscopy w/  L retrograde pyelogram and Cystolitholapaxy 12/28 concerning for possible L renal and urinary bladder amorphous masses Pathology report of the left kidney wedge biopsy showed degenerated material with oxalate crystals and bacterial overgrowth.  Fungal elements were not identified.  Fungal ball did not show any fungal elements either. Patient has been on broad-spectrum antibiotics.  Subsequently seen by infectious disease.  Zosyn  was changed over to Augmentin .  Not on any antifungals currently.  Patient is on Tamiflu .   Paroxysmal Atrial fibrillation Noted to have RVR.  Patient is on amiodarone  infusion.  Patient is on Eliquis .  Cardiology is following.     Acute COPD exacerbation - status post albuterol  nebs, Solu-Medrol  125 mg, and IV mag 2 g in the ED.  - finished 5 days total steroid course on 12/29 Continues to have dyspnea.  May need to resume steroids.   Elevated troponin NSTEMI History of CAD - EKG without acute ischemic changes Minimally elevated troponin levels noted.  Cardiology is following.  No plans for catheterization.   Acute liver failure  Patient with significant elevation in AST ALT.  Possibly due to shock liver.  LFTs are trending downwards.  Bilirubin is noted to be normal.  Hepatitis panel was unremarkable.  Acetaminophen  level was low.  Statin is on hold. She will need outpatient evaluation by general surgery for consideration of cholecystectomy after her acute pulmonary and cardiac issues resolve.    AKI on CKD stage IIIa High anion gap metabolic acidosis Baseline creatinine around 1.2.  Creatinine was 2.97 when she was discharged on 12/24.  Likely has higher baseline creatinine now. After getting IV diuretics creatinine did increase.  Now stabilizing again.  Avoid nephrotoxic agents.  Bicarbonate level has improved with sodium bicarbonate .   Strict ins and outs.   Chronic Intermittent Back pain Dengenerative disc disease Mechanical Fall - No  traumatic injuries identified on imaging of chest, pelvis, bilateral knees, left foot, head, and C-spine.  Fall precautions.  - MRI L spine 12/29 showed Mild dextroscoliosis of the thoracolumbar spine, Grade 1 degenerative anterolisthesis at L3-4 and L4-5, Mild central spinal canal stenosis and moderate right neural foraminal stenosis at L4-5, Mild-to-moderate central spinal canal stenosis at L3-4, Mild central spinal canal stenosis at T12-L1. - cont lidocaine  patch - PT/OT following   Lactic acidosis - likely in the setting of hypotension due to possible shock state (septic vs cardiogenic), NSTEMI and acute liver failure - resolved now - monitor clinically   QT prolongation QTc 531 on EKG.   - amiodarone  as elsewhere - Monitor potassium and magnesium  levels.  -  Avoid QT prolonging drugs if possible.  -  Follow-up repeat EKG.   Hyperlipidemia - hold statin therapy given acute liver failure   Mild chronic hyponatremia - resolved   Hypothyroidism - cont levothyroxine    Anxiety and depression - cont home fluoxetine    Obesity Estimated body mass index is 37.75 kg/m as calculated from the following:   Height as of this encounter: 5' 5 (1.651 m).   Weight as of this encounter: 102.9 kg.   DVT Prophylaxis: Apixaban  Code Status: DNR Family Communication: Discussed with patient Disposition Plan: SNF Improved    Medications: Scheduled:  amoxicillin -clavulanate  1 tablet Oral Q12H   apixaban   5 mg Oral BID   brimonidine   1 drop Right Eye BID   And   timolol   1 drop Right Eye BID   Chlorhexidine  Gluconate Cloth  6 each Topical Daily   diclofenac  Sodium  2 g Topical TID  AC & HS   FLUoxetine   40 mg Oral Daily   guaiFENesin   600 mg Oral BID   latanoprost   1 drop Right Eye QHS   levothyroxine   112 mcg Oral q morning   metoprolol  tartrate  12.5 mg Oral TID   oseltamivir   30 mg Oral Daily   sodium bicarbonate   325 mg Oral BID   sodium chloride  flush  10-40 mL Intracatheter  Q12H   Continuous:  amiodarone  30 mg/hr (07/03/24 0113)   PRN:albuterol , calcium  carbonate, oxyCODONE , prochlorperazine , sodium chloride  flush  Antibiotics: Anti-infectives (From admission, onward)    Start     Dose/Rate Route Frequency Ordered Stop   07/03/24 1015  amoxicillin -clavulanate (AUGMENTIN ) 875-125 MG per tablet 1 tablet        1 tablet Oral Every 12 hours 07/03/24 0922     07/02/24 1200  amoxicillin -clavulanate (AUGMENTIN ) 500-125 MG per tablet 1 tablet  Status:  Discontinued        1 tablet Oral 2 times daily 07/02/24 1011 07/03/24 0922   07/02/24 1100  amoxicillin -clavulanate (AUGMENTIN ) 875-125 MG per tablet 1 tablet  Status:  Discontinued        1 tablet Oral Every 12 hours 07/02/24 1011 07/02/24 1012   07/01/24 1400  piperacillin -tazobactam (ZOSYN ) IVPB 3.375 g  Status:  Discontinued        3.375 g 12.5 mL/hr over 240 Minutes Intravenous Every 8 hours 07/01/24 1023 07/02/24 1011   06/30/24 1530  micafungin  (MYCAMINE ) 100 mg in sodium chloride  0.9 % 100 mL IVPB        100 mg 105 mL/hr over 1 Hours Intravenous Every 24 hours 06/30/24 1444     06/29/24 1000  vancomycin  (VANCOCIN ) IVPB 1000 mg/200 mL premix  Status:  Discontinued        1,000 mg 200 mL/hr over 60 Minutes Intravenous Every 48 hours 06/27/24 0624 06/27/24 0924   06/28/24 2200  vancomycin  (VANCOCIN ) IVPB 1000 mg/200 mL premix        1,000 mg 200 mL/hr over 60 Minutes Intravenous  Once 06/28/24 1515 06/28/24 2235   06/28/24 1000  oseltamivir  (TAMIFLU ) capsule 30 mg        30 mg Oral Daily 06/28/24 0728 07/08/24 0959   06/27/24 1200  piperacillin -tazobactam (ZOSYN ) IVPB 3.375 g  Status:  Discontinued        3.375 g 12.5 mL/hr over 240 Minutes Intravenous Every 8 hours 06/27/24 0624 06/27/24 0924   06/27/24 1200  piperacillin -tazobactam (ZOSYN ) IVPB 2.25 g  Status:  Discontinued        2.25 g 100 mL/hr over 30 Minutes Intravenous Every 8 hours 06/27/24 0924 07/01/24 1023   06/27/24 0924  vancomycin   variable dose per unstable renal function (pharmacist dosing)  Status:  Discontinued         Does not apply See admin instructions 06/27/24 0924 06/29/24 0801   06/27/24 0500  oseltamivir  (TAMIFLU ) capsule 30 mg  Status:  Discontinued        30 mg Oral  Once 06/27/24 0425 06/28/24 0728   06/27/24 0045  piperacillin -tazobactam (ZOSYN ) IVPB 3.375 g        3.375 g 100 mL/hr over 30 Minutes Intravenous  Once 06/27/24 0036 06/27/24 0219   06/27/24 0045  vancomycin  (VANCOREADY) IVPB 1500 mg/300 mL        1,500 mg 150 mL/hr over 120 Minutes Intravenous  Once 06/27/24 0036 06/27/24 0401       Objective:  Vital Signs  Vitals:   07/03/24 0750 07/03/24  9185 07/03/24 0823 07/03/24 1021  BP: (!) 117/94  123/71 118/70  Pulse: (!) 118 (!) 124 69 (!) 123  Resp: (!) 34 (!) 25 (!) 29   Temp: (!) 96.7 F (35.9 C)  97.8 F (36.6 C)   TempSrc: Axillary  Axillary   SpO2: (!) 88% 92% 90%   Weight:      Height:        Intake/Output Summary (Last 24 hours) at 07/03/2024 1137 Last data filed at 07/03/2024 0823 Gross per 24 hour  Intake 240 ml  Output 400 ml  Net -160 ml   Filed Weights   07/01/24 0904 07/02/24 0500 07/03/24 0526  Weight: 107.6 kg 103.3 kg 102.9 kg    General appearance: Awake alert.  In no distress Resp: Tachypneic with diminished air entry at the bases.  No definite crackles or wheezing. Cardio: S1-S2 is irregularly irregular, tachycardic.  No S3-S4.  No rubs murmurs or bruit GI: Abdomen is soft.  Nontender nondistended.  Bowel sounds are present normal.  No masses organomegaly Extremities: No edema.  Full range of motion of lower extremities.  Physical deconditioning is noted Neurologic: Alert and oriented x3.  No focal neurological deficits.    Lab Results:  Data Reviewed: I have personally reviewed following labs and reports of the imaging studies  CBC: Recent Labs  Lab 06/28/24 0531 06/28/24 1932 06/29/24 0554 06/30/24 0555 06/30/24 1523 07/01/24 1431  07/02/24 0920 07/03/24 0552  WBC 30.4*   < > 25.2* 16.8* 39.0*  40.5* 26.0* 22.9* 15.8*  NEUTROABS 28.8*  --  23.3* 14.9* 35.9*  --   --   --   HGB 10.5*   < > 11.0* 11.0* 10.2*  10.1* 10.2* 10.3* 10.9*  HCT 30.8*   < > 32.6* 34.2* 32.2*  31.5* 32.0* 32.9* 35.2*  MCV 90.1   < > 89.6 91.7 94.4  92.9 92.2 92.7 96.2  PLT 260   < > 304 275 235  247 203 233 193   < > = values in this interval not displayed.    Basic Metabolic Panel: Recent Labs  Lab 06/27/24 0705 06/27/24 1138 06/27/24 1514 06/30/24 0555 06/30/24 1144 06/30/24 1523 07/01/24 0500 07/02/24 0920 07/03/24 0552  NA 132*  --    < > 142  --  139 140 139 141  K 4.7  --    < > 3.2*  --  3.7 4.1 3.3* 4.2  CL 98  --    < > 98  --  95* 99 100 104  CO2 19*  --    < > 26  --  21* 25 26 26   GLUCOSE 170*  --    < > 136*  --  255* 147* 236* 212*  BUN 59*  --    < > 79*  --  77* 83* 77* 70*  CREATININE 3.36*  --    < > 2.89*  --  3.02* 2.90* 2.20* 1.91*  CALCIUM  8.7*  --    < > 9.1  --  8.9 9.2 9.0 9.0  MG 3.2* 3.1*  --   --  2.0  --   --   --   --   PHOS  --  5.7*  --   --   --   --   --   --   --    < > = values in this interval not displayed.    GFR: Estimated Creatinine Clearance: 33.1 mL/min (A) (by C-G formula based on  SCr of 1.91 mg/dL (H)).  Liver Function Tests: Recent Labs  Lab 06/29/24 0554 06/30/24 0555 07/01/24 0500 07/02/24 0920 07/03/24 0552  AST 1,888* 755* 410* 254* 182*  ALT 2,069* 1,438* 1,037* 746* 564*  ALKPHOS 249* 208* 196* 193* 194*  BILITOT 1.1 0.9 0.8 0.8 0.7  PROT 5.9* 5.8* 5.9* 5.9* 5.9*  ALBUMIN  2.8* 2.7* 2.9* 2.8* 2.8*    Coagulation Profile: Recent Labs  Lab 06/26/24 2323  INR 2.4*    Cardiac Enzymes: Recent Labs  Lab 06/27/24 0002 06/27/24 0705  CKTOTAL 385* 894*    BNP (last 3 results) Recent Labs    06/27/24 0002 06/27/24 0705  PROBNP 8,463.0* 67,156.9*    Recent Results (from the past 240 hours)  Resp panel by RT-PCR (RSV, Flu A&B, Covid) Anterior Nasal  Swab     Status: Abnormal   Collection Time: 06/27/24 12:05 AM   Specimen: Anterior Nasal Swab  Result Value Ref Range Status   SARS Coronavirus 2 by RT PCR NEGATIVE NEGATIVE Final   Influenza A by PCR POSITIVE (A) NEGATIVE Final   Influenza B by PCR NEGATIVE NEGATIVE Final    Comment: (NOTE) The Xpert Xpress SARS-CoV-2/FLU/RSV plus assay is intended as an aid in the diagnosis of influenza from Nasopharyngeal swab specimens and should not be used as a sole basis for treatment. Nasal washings and aspirates are unacceptable for Xpert Xpress SARS-CoV-2/FLU/RSV testing.  Fact Sheet for Patients: bloggercourse.com  Fact Sheet for Healthcare Providers: seriousbroker.it  This test is not yet approved or cleared by the [place]  FDA and has been authorized for detection and/or diagnosis of SARS-CoV-2 by FDA under an Emergency Use Authorization (EUA). This EUA will remain in effect (meaning this test can be used) for the duration of the COVID-19 declaration under Section 564(b)(1) of the Act, 21 U.S.C. section 360bbb-3(b)(1), unless the authorization is terminated or revoked.     Resp Syncytial Virus by PCR NEGATIVE NEGATIVE Final    Comment: (NOTE) Fact Sheet for Patients: bloggercourse.com  Fact Sheet for Healthcare Providers: seriousbroker.it  This test is not yet approved or cleared by the [place]  FDA and has been authorized for detection and/or diagnosis of SARS-CoV-2 by FDA under an Emergency Use Authorization (EUA). This EUA will remain in effect (meaning this test can be used) for the duration of the COVID-19 declaration under Section 564(b)(1) of the Act, 21 U.S.C. section 360bbb-3(b)(1), unless the authorization is terminated or revoked.  Performed at [facility], [address], [place], [place] [address]   Urine Culture     Status: Abnormal    Collection Time: 06/27/24  6:20 AM   Specimen: Urine, Clean Catch  Result Value Ref Range Status   Specimen Description URINE, CLEAN CATCH  Final   Special Requests   Final    NONE Performed at [facility], [address], [place], [place] [address]    Culture >=100,000 COLONIES/mL ENTEROCOCCUS FAECALIS (A)  Final   Report Status 07/01/2024 FINAL  Final   Organism ID, Bacteria ENTEROCOCCUS FAECALIS (A)  Final      Susceptibility   Enterococcus faecalis - MIC*    AMPICILLIN <=2 SENSITIVE Sensitive     NITROFURANTOIN <=16 SENSITIVE Sensitive     VANCOMYCIN  1 SENSITIVE Sensitive     * >=100,000 COLONIES/mL ENTEROCOCCUS FAECALIS  Culture, blood (Routine X 2) w Reflex to ID Panel     Status: None   Collection Time: 06/27/24  6:57 AM   Specimen: BLOOD LEFT  HAND  Result Value Ref Range Status   Specimen Description BLOOD LEFT HAND  Final   Special Requests   Final    BOTTLES DRAWN AEROBIC ONLY Blood Culture results may not be optimal due to an inadequate volume of blood received in culture bottles   Culture   Final    NO GROWTH 5 DAYS Performed at [facility], [address], [place], [place] [address]    Report Status 07/02/2024 FINAL  Final  Culture, blood (Routine X 2) w Reflex to ID Panel     Status: None   Collection Time: 06/27/24  7:45 AM   Specimen: BLOOD  Result Value Ref Range Status   Specimen Description BLOOD RIGHT ANTECUBITAL  Final   Special Requests   Final    BOTTLES DRAWN AEROBIC AND ANAEROBIC Blood Culture adequate volume   Culture   Final    NO GROWTH 5 DAYS Performed at [facility], [address] [place], [place] [address]    Report Status 07/02/2024 FINAL  Final  MRSA Next Gen by PCR, Nasal     Status: None   Collection Time: 06/27/24 11:38 AM   Specimen: Nasal Mucosa; Nasal Swab  Result Value Ref Range Status   MRSA by PCR Next Gen NOT DETECTED NOT DETECTED Final    Comment: (NOTE) The GeneXpert MRSA Assay (FDA approved for NASAL  specimens only), is one component of a comprehensive MRSA colonization surveillance program. It is not intended to diagnose MRSA infection nor to guide or monitor treatment for MRSA infections. Test performance is not FDA approved in patients less than 7 years old. Performed at [facility], [address] [place], [place] [address]   Aerobic/Anaerobic Culture w Gram Stain (surgical/deep wound)     Status: None (Preliminary result)   Collection Time: 06/30/24 10:34 AM   Specimen: Path fluid; Body Fluid  Result Value Ref Range Status   Specimen Description FLUID LEFT KIDNEY  Final   Special Requests NONE  Final   Gram Stain RARE WBC SEEN NO ORGANISMS SEEN   Final   Culture   Final    NO GROWTH 3 DAYS NO ANAEROBES ISOLATED; CULTURE IN PROGRESS FOR 5 DAYS Performed at [facility], [address], [place], [place] [address]    Report Status PENDING  Incomplete      Radiology Studies: DG CHEST PORT 1 VIEW Result Date: 07/02/2024 CLINICAL DATA:  Hypoxia EXAM: PORTABLE CHEST 1 VIEW COMPARISON:  June 26, 2024 FINDINGS: Stable cardiomegaly. Interval placement of right-sided PICC line with distal tip in expected position of cavoatrial junction. No definite acute pulmonary abnormality seen. IMPRESSION: Interval placement of right-sided PICC line with distal tip in expected position of cavoatrial junction. Electronically Signed   By: Lynwood Landy Raddle M.D.   On: 07/02/2024 13:31   MR LUMBAR SPINE WO CONTRAST Result Date: 07/02/2024 EXAM: MRI LUMBAR SPINE 07/02/2024 12:53:13 AM TECHNIQUE: Multiplanar multisequence MRI of the lumbar spine was performed without the administration of intravenous contrast. COMPARISON: None available. CLINICAL HISTORY: Concern for osteomyelitis. Severe pain. FINDINGS: BONES AND ALIGNMENT: Mild dextroscoliosis of the thoracolumbar spine. Grade 1 degenerative anterolisthesis present at L3-L4 and L4-L5. Normal vertebral body heights. Bone marrow signal is  unremarkable. Mild facet hypertrophic changes. SPINAL CORD: The conus terminates normally. SOFT TISSUES: No paraspinal mass. T12-L1: Central disc bulge causing mild central spinal canal stenosis. L1-L2: No significant disc herniation. No spinal canal stenosis or neural foraminal narrowing. L2-L3: No  significant disc herniation. No spinal canal stenosis or neural foraminal narrowing. L3-L4: Grade 1 degenerative anterolisthesis with mild-to-moderate central spinal canal stenosis. L4-L5: Grade 1 anterolisthesis with mild central spinal canal stenosis and moderate right neural foraminal stenosis. L5-S1: Diffuse disc bulging and endplate ridging, but no significant spinal canal or neural foraminal stenosis. IMPRESSION: 1. Mild dextroscoliosis of the thoracolumbar spine. 2. Grade 1 degenerative anterolisthesis at L3-4 and L4-5. 3. Mild central spinal canal stenosis and moderate right neural foraminal stenosis at L4-5. 4. Mild-to-moderate central spinal canal stenosis at L3-4. 5. Mild central spinal canal stenosis at T12-L1. Electronically signed by: Evalene Coho MD 07/02/2024 06:07 AM EST RP Workstation: [id number]       LOS: 6 days   Joette Pebbles  [facility] Pager on [email or web address]  07/03/2024, 11:37 AM   "

## 2024-07-03 NOTE — TOC Progression Note (Signed)
 Transition of Care [facility]) - Progression Note    Patient Details  Name: Lauren Clark MRN: [id number] Date of Birth: July 22, 1955  [facility] CM/SW Contact  Isaiah Public, LCSWA Phone Number: 07/03/2024, 11:33 AM  Clinical Narrative:     Patient has SNF bed at [facility]. CSW following to start insurance authorization closer to patient being medically ready. CSW will continue to follow and assist with patients dc planning needs.   Expected Discharge Plan: Skilled Nursing Facility Barriers to Discharge: Continued Medical Work up               Expected Discharge Plan and Services       Living arrangements for the past 2 months: Apartment                                       Social Drivers of Health (SDOH) Interventions SDOH Screenings   Food Insecurity: Food Insecurity Present (06/27/2024)  Housing: Low Risk (06/27/2024)  Transportation Needs: Unmet Transportation Needs (06/27/2024)  Utilities: Not At Risk (06/27/2024)  Social Connections: Moderately Integrated (06/27/2024)  Tobacco Use: Low Risk (06/30/2024)    Readmission Risk Interventions     No data to display

## 2024-07-03 NOTE — Progress Notes (Incomplete)
 Patient ID: Lauren Clark, female   DOB: 01-17-1956, 68 y.o.   MRN: [id number]    Progress Note from the Palliative Medicine Team at [facility]   Patient Name: Lauren Clark        Date: 07/03/2024 DOB: 09/12/1955  Age: 68 y.o. MRN#: [id number] Attending Physician: Lauren Purchase, MD Primary Care Physician: Lauren Leni Edyth DELENA, MD Admit Date: 06/26/2024   Reason for Consultation/Follow-up   Establishing Goals of Care   HPI/ Brief Hospital Review  68 y.o. female   admitted on 06/26/2024 with past medical history significant for morbid obesity status post gastric sleeve, history of DVT, A-fib, CHF, hypertension, thoracic aortic aneurysm, hypothyroidism, nonobstructive CAD,   vision impairment admitted for evaluation of elevated troponin.    Recent hospitalization 06/15/2024 to 06/16/2024 for AKI, pyelonephritis, mildly elevated LFTs, and acute on chronic hypoxic respiratory failure   Admitted for treatment and stabilization.  Patient with elevated LFTs, elevated troponin, COPD exacerbation in the setting of influenza A.  Improving with full medical support.  Ongoing discussion regarding next steps in transition of care.     Patient faces treatment option decisions, advanced directive decisions and anticipatory care needs.   Subjective  Extensive chart review has been completed prior to meeting with patient/family  including labs, vital signs, imaging, progress/consult notes, orders, medications and available advance directive documents.   This NP assessed patient at the bedside as a follow up for palliaitve medicine needs and emotional support and to meet as scheduled with main support person/friend/ Lauren Clark  for ongoing conversation/education regarding current complex medical situation, and associated decisions specific to advanced care planning, treatment options and anticipatory care needs.    Patient clearly verbalizes an understanding regarding of her medical diagnosis.  She recognizes significant physical and functional decline over the past month.    Patient is very comfortable speaking to her humanness and her own mortality.  She speaks to her grounded ness in her Buddhist teachings.   She is hopefull for ongoing physical and functional  improvement.  Treat the treatable, open to SNF for short term rehab.  She also communicates with [place] limitations to aggressive medical interventions as it relates to her values on quality of life.          Patient verbalizes desire for Lauren Clark to be her HPOA.  Will ask Spiritual Care to assist patient with documentation    Plan of Care: -DNR/DNI limited-patient is open to all offered and availble medical intervetnions to porolong life at this time -SNF for short term rehab -OP Palliative services at facility  -PMT to continue to support holistically    Education offered on MOST form, to complete before discharge  Education offered today regarding  the importance of continued conversation with family and their  medical providers regarding overall plan of care and treatment options,  ensuring decisions are within the context of the patients values and GOCs.  Questions and concerns addressed   Discussed with primary team and nursing staff   Time:  60   minutes  Detailed review of medical records ( labs, imaging, vital signs), medically appropriate exam ( MS, skin, cardiac,  resp)   discussed with treatment team, counseling and education to patient, family, staff, documenting clinical information, medication management, coordination of care    Lauren Plants NP  Palliative Medicine Team Team Phone # [phone] Pager [phone]

## 2024-07-04 DIAGNOSIS — J9621 Acute and chronic respiratory failure with hypoxia: Secondary | ICD-10-CM | POA: Diagnosis not present

## 2024-07-04 DIAGNOSIS — J441 Chronic obstructive pulmonary disease with (acute) exacerbation: Secondary | ICD-10-CM | POA: Diagnosis not present

## 2024-07-04 DIAGNOSIS — J9601 Acute respiratory failure with hypoxia: Secondary | ICD-10-CM

## 2024-07-04 DIAGNOSIS — J101 Influenza due to other identified influenza virus with other respiratory manifestations: Secondary | ICD-10-CM | POA: Diagnosis not present

## 2024-07-04 DIAGNOSIS — N179 Acute kidney failure, unspecified: Secondary | ICD-10-CM | POA: Diagnosis not present

## 2024-07-04 LAB — GLUCOSE, CAPILLARY
Glucose-Capillary: 173 mg/dL — ABNORMAL HIGH (ref 70–99)
Glucose-Capillary: 244 mg/dL — ABNORMAL HIGH (ref 70–99)
Glucose-Capillary: 252 mg/dL — ABNORMAL HIGH (ref 70–99)
Glucose-Capillary: 184 mg/dL — ABNORMAL HIGH (ref 70–99)

## 2024-07-04 LAB — COMPREHENSIVE METABOLIC PANEL WITH GFR
ALT: 409 U/L — ABNORMAL HIGH (ref 0–44)
AST: 120 U/L — ABNORMAL HIGH (ref 15–41)
Albumin: 2.5 g/dL — ABNORMAL LOW (ref 3.5–5.0)
Alkaline Phosphatase: 188 U/L — ABNORMAL HIGH (ref 38–126)
Anion gap: 13 (ref 5–15)
BUN: 62 mg/dL — ABNORMAL HIGH (ref 8–23)
CO2: 20 mmol/L — ABNORMAL LOW (ref 22–32)
Calcium: 9.5 mg/dL (ref 8.9–10.3)
Chloride: 105 mmol/L (ref 98–111)
Creatinine, Ser: 1.37 mg/dL — ABNORMAL HIGH (ref 0.44–1.00)
GFR, Estimated: 42 mL/min — ABNORMAL LOW
Glucose, Bld: 202 mg/dL — ABNORMAL HIGH (ref 70–99)
Potassium: 5.1 mmol/L (ref 3.5–5.1)
Sodium: 138 mmol/L (ref 135–145)
Total Bilirubin: 0.9 mg/dL (ref 0.0–1.2)
Total Protein: 6.1 g/dL — ABNORMAL LOW (ref 6.5–8.1)

## 2024-07-04 LAB — CBC
HCT: 37.3 % (ref 36.0–46.0)
Hemoglobin: 11.4 g/dL — ABNORMAL LOW (ref 12.0–15.0)
MCH: 29.5 pg (ref 26.0–34.0)
MCHC: 30.6 g/dL (ref 30.0–36.0)
MCV: 96.4 fL (ref 80.0–100.0)
Platelets: 159 K/uL (ref 150–400)
RBC: 3.87 MIL/uL (ref 3.87–5.11)
RDW: 17.4 % — ABNORMAL HIGH (ref 11.5–15.5)
WBC: 13.1 K/uL — ABNORMAL HIGH (ref 4.0–10.5)
nRBC: 0.6 % — ABNORMAL HIGH (ref 0.0–0.2)

## 2024-07-04 MED ORDER — METOPROLOL TARTRATE 25 MG PO TABS
25.0000 mg | ORAL_TABLET | Freq: Three times a day (TID) | ORAL | Status: DC
  Administered 2024-07-04 – 2024-07-05 (×4): 25 mg via ORAL
  Filled 2024-07-04 (×5): qty 1

## 2024-07-04 MED ORDER — FUROSEMIDE 10 MG/ML IJ SOLN
40.0000 mg | Freq: Once | INTRAMUSCULAR | Status: AC
  Administered 2024-07-04: 40 mg via INTRAVENOUS
  Filled 2024-07-04: qty 4

## 2024-07-04 MED ORDER — LIDOCAINE 5 % EX PTCH
1.0000 | MEDICATED_PATCH | CUTANEOUS | Status: DC
  Administered 2024-07-04 – 2024-07-09 (×6): 1 via TRANSDERMAL
  Filled 2024-07-04 (×6): qty 1

## 2024-07-04 MED ORDER — MORPHINE SULFATE (PF) 2 MG/ML IV SOLN
1.0000 mg | Freq: Once | INTRAVENOUS | Status: AC
  Administered 2024-07-04: 1 mg via INTRAVENOUS
  Filled 2024-07-04: qty 1

## 2024-07-04 NOTE — Progress Notes (Signed)
 "  Progress Note  Patient Name: Lauren Clark Date of Encounter: 07/04/2024 [facility] Cardiologist: Vinie JAYSON Maxcy, MD   Interval Summary   Breathing is intermittently worse, overall not a big change from yesterday.  Still on O2 at 4 L. Remains in atrial fibrillation with mediocre but adequate rate control (90s at night, 100-110 while awake). She is very upset about fingerstick blood glucose assays.  She would like the blood drawn from her PICC line.  Vital Signs Vitals:   07/04/24 0700 07/04/24 0800 07/04/24 0824 07/04/24 0825  BP: 117/71 (!) 128/98    Pulse: (!) 105 (!) 109    Resp: 19 (!) 25    Temp: 98.7 F (37.1 C)     TempSrc: Oral     SpO2: 97% 98% 100% 100%  Weight:      Height:        Intake/Output Summary (Last 24 hours) at 07/04/2024 0955 Last data filed at 07/04/2024 9688 Gross per 24 hour  Intake 800 ml  Output 1200 ml  Net -400 ml      07/04/2024    3:09 AM 07/03/2024    5:26 AM 07/02/2024    5:00 AM  Last 3 Weights  Weight (lbs) 227 lb 8.2 oz 226 lb 13.7 oz 227 lb 11.8 oz  Weight (kg) 103.2 kg 102.9 kg 103.3 kg      Telemetry/ECG  Atrial fibrillation with mild RVR during the daytime (110's), fairly well-controlled at night (90s).- Personally Reviewed  Physical Exam  GEN: No acute distress.   Neck: No JVD Cardiac: Irregular, no murmurs, rubs, or gallops.  Respiratory: Bilateral rhonchi and wheezes. GI: Soft, nontender, non-distended  MS: No edema.  Lots of skin ecchymoses, including at the PICC line site.  No active bleeding or hematoma.  Assessment & Plan  69 year old woman with history of chronic HFpEF, now admitted with newly reduced left ventricular systolic function (possible stress-induced cardiomyopathy) and persistent atrial fibrillation in the setting of acute influenza A infection and concern for possible fungal UTI,  complicated by septic shock (resolved) and multiple organ failure on a background of chronic hypoxic respiratory  failure due to COPD (home O2 2 L per nasal cannula) , nonobstructive CAD, paroxysmal atrial fibrillation, chronic kidney disease stage IIIb, history of remote DVT, HTN, HLP, thoracic aortic aneurysm, treated hypothyroidism, morbid obesity status post gastric sleeve. Septic shock: Resolved.  Shock liver elevation and transaminases continues to improve as this kidney function.  Remains on antibiotics/antifungals. Acute on chronic heart failure: Suspicion for stress-induced cardiomyopathy/Takotsubo syndrome (versus LAD distribution acute ischemia), superimposed on chronic HFpEF.  LVEF was normal at 55 to 60% in mid November, down to 30-35% with apical akinesis by echo performed 06/27/2024.  Hopefully we should be seeing some improvement soon if this was indeed stress cardiomyopathy.  Difficult to assess volume status due to her body habitus.  If in/out records are accurate she is -2.3 L since admission.  However, based on her weight she is actually 5.7 kg heavier compared to her last cardiology clinic visit on December 2.  I suspect she requires diuresis.  We have been avoiding diuretics due to her recent acute kidney injury.  Will give a one-time dose of intravenous furosemide  and monitor kidney function.  She does have a history of chronic HFpEF preceding this admission.  SGLT2 inhibitor was on hold due to suspicion for urinary fungal infection. A-fib: Rate control is mediocre, on intravenous amiodarone .  Blood pressure is better.  Will  increase metoprolol  to 25 mg 3 times daily.  She was on chronic anticoagulation, but this has been interrupted during this hospitalization.  To perform cardioversion she will need a TEE.  Will check and see if there is any room on the schedule tomorrow for TEE cardioversion. Acute on chronic hypoxic respiratory failure: On O2 2 L/min by nasal cannula at home for COPD, currently oxygen requirement up to 4 L/minute.  Unclear how much of this is residual pulmonary injury from her  acute influenza A infection and how much is due to acute on chronic heart failure. Acute on chronic renal insufficiency: Steady improvement in renal parameters over the last 5 to 6 days.  Creatinine down to 1.9 (baseline seems to be around 1.3).  Even before the current admission she had been hospitalized with acute kidney injury in the setting of pyelonephritis and ileus, with a peak creatinine up to 3.86. Ascending aortic aneurysm (5 cm with chronic intramural hematoma), not a candidate for surgical repair in my opinion. CAD: Previous catheter at catheterization shows heavy burden of atherosclerosis (calcium  score 97th percentile, but with coronary stenoses all less than 50% by CT angiography performed October 2025).  The most severe stenosis in the proximal LAD was 25-49%.  Cannot exclude acute coronary syndrome due to ruptured vulnerable plaque, but it is more likely that she has stress cardiomyopathy.    For questions or updates, please contact [facility] Please consult [email or web address] for contact info under         Signed, Jerel Balding, MD   "

## 2024-07-04 NOTE — Plan of Care (Signed)

## 2024-07-04 NOTE — Progress Notes (Signed)
 "  [facility] PROGRESS NOTE   Lauren Clark FMW:[id number] DOB: 15-Dec-1955 DOA: 06/26/2024  PCP: [name], MD  Brief History: 69 y.o. female with medical history significant of COPD, chronic hypoxemic respiratory failure on 2 L home oxygen, CAD, A-fib on Eliquis , HFpEF, ascending aortic aneurysm followed by cardiothoracic surgery, hypertension, hyperlipidemia, hypothyroidism, CKD stage IIIa, anemia, anxiety, depression, GERD, glaucoma, vision impairment, morbid obesity status post gastric sleeve surgery, PVD.  Recent hospital admission 12/13-12/24 for AKI, pyelonephritis, ileus, mildly elevated LFTs, and acute on chronic hypoxemic respiratory failure secondary to CHF exacerbation. Creatinine had peaked to 3.86.  Her diuretics were held and she was given IV albumin .  Creatinine improved to 2.97 at the time of discharge and nephrology resumed Lasix ..  Completed 7 days of Rocephin  for pyelonephritis.  She was evaluated by physical therapy and discharged with home health PT/OT afternoon.  Patient returned to the ED via EMS due to concern for shortness of breath, fall at home, and possible syncope. Admitted for acute on chronic hypoxic respiratory failure, Acute influenza PNA with superimposed bacterial PNA, acute COPD exacerbation, NSTEMI, acute CHF exacerbation, prolonged Qtc, Hypotension, acute liver failure, and acute on chronic kidney disease.   Consultants: Cardiology.  Infectious disease.  Urology.  Palliative care  Procedures:  Cystoscopy with cystolitholopaxy of 7 cm bladder collection/foreign body.  Ureteroscopy with laser destruction of lesion of upper and lower pole of left kidney with Dr. Cam on 12/28    Subjective/Interval History: Patient somewhat somnolent this morning though easily arousable.  In better spirits today compared to yesterday.  Shortness of breath has improved compared to yesterday.  Still wheezing.   Assessment/Plan:  Acute on chronic hypoxemic  respiratory failure Acute influenza A infection Possible bacterial PNA COPD with acute exacerbation Patient was noted to be saturating in the 80s on her home 2 L [place] with EMS.  She was wheezing with EMS and was given albuterol  treatment. She completed 7-day course of ceftriaxone . Continues to have some wheezing on examination.  Chest x-ray done recently did not show any acute findings.  Symptoms appear to be somewhat chronic. Blood gas without evidence of hypercapnia.   She was given furosemide  during earlier part of this admission but had to be held due to AKI.  See below. Noted to be on Tamiflu .   Acute on chronic HFpEF Hypotension CXR with no any acute findings but stable cardiomegaly.  Tachyarrhythmia could be causing some volume overload. Echocardiogram does show LVEF of 30 to 35%.  Grade 2 diastolic dysfunction noted. Cardiology is following. Patient was given furosemide  once in the emergency department and then again on 12/26 but not continued due to AKI. Cardiac status seems to be stable.   Acute complicated UTI Bladder/kidney lesions Sepsis, present on admission Recently finished 7-day course of Rocephin  for pyelonephritis. Prior UCx with polymicrobial growth at that time UA continues to show evidence of pyuria and bacteria. Ucx with ampicillin-sensitive E. Faecalis Blood culture without any growth. MRSA negative CXR with no any acute findings but stable cardiomegaly CT chest w/o contrast showed interval development of patchy ground-glass opacity with interlobular septal thickening in both upper lobes, L > R. Confluent areas of consolidative airspace disease are seen in a patchy distribution of the mid and lower lungs bilaterally. Imaging features are compatible with multifocal pneumonia. CT abdomen pelvis w/o contrast showed mild L hydroureteronephrosis without stone, high density material at upper pole calyx in L kidney that maybe blood product/infectious lesion/neoplasm,  lamellated stone  with internal gas in urinary bladder, cholelithiasis  Status post ureterocystoscopy w/ L retrograde pyelogram and Cystolitholapaxy 12/28 concerning for possible L renal and urinary bladder amorphous masses Pathology report of the left kidney wedge biopsy showed degenerated material with oxalate crystals and bacterial overgrowth.  Fungal elements were not identified.  Fungal ball did not show any fungal elements either. Patient has been on broad-spectrum antibiotics.  Subsequently seen by infectious disease.   Patient is currently noted to be on Augmentin .  Started back on micafungin . Mild hematuria noted.  Hemoglobin is stable though.   Paroxysmal Atrial fibrillation Noted to have RVR.  Patient is on amiodarone  infusion.  Patient is on Eliquis .  Cardiology is following.     Elevated troponin NSTEMI History of CAD EKG without acute ischemic changes Minimally elevated troponin levels noted.  Cardiology is following.  No plans for catheterization.   Acute liver failure  Patient with significant elevation in AST ALT.  Possibly due to shock liver.  LFTs are trending downwards.  Bilirubin is noted to be normal.  Hepatitis panel was unremarkable.  Acetaminophen  level was low.  Statin is on hold. She will need outpatient evaluation by general surgery for consideration of cholecystectomy after her acute pulmonary and cardiac issues resolve.  Continue to trend LFTs every so often.   AKI on CKD stage IIIa High anion gap metabolic acidosis Baseline creatinine around 1.2.  Creatinine was 2.97 when she was discharged on 12/24.  Likely has higher baseline creatinine now. After getting IV diuretics creatinine did increase.  Now stabilizing again.  Avoid nephrotoxic agents.  Bicarbonate level has improved with sodium bicarbonate .   Strict ins and outs. Follow-up labs from today.   Chronic Intermittent Back pain Dengenerative disc disease Mechanical Fall No traumatic injuries identified  on imaging of chest, pelvis, bilateral knees, left foot, head, and C-spine.  Fall precautions.  MRI L spine 12/29 showed Mild dextroscoliosis of the thoracolumbar spine, Grade 1 degenerative anterolisthesis at L3-4 and L4-5, Mild central spinal canal stenosis and moderate right neural foraminal stenosis at L4-5, Mild-to-moderate central spinal canal stenosis at L3-4, Mild central spinal canal stenosis at T12-L1. Cont lidocaine  patch PT/OT following   Lactic acidosis Likely in the setting of hypotension due to possible shock state (septic vs cardiogenic), NSTEMI and acute liver failure.  Resolved now.   QT prolongation QTc 531 on EKG.   Monitor electrolytes.  Avoid QT prolonging medications if possible.  Monitor on telemetry.   Hyperlipidemia Holding statin due to elevated LFTs.   Mild chronic hyponatremia Resolved   Hypothyroidism Continue levothyroxine    Anxiety and depression  Continue home fluoxetine    Obesity Estimated body mass index is 37.86 kg/m as calculated from the following:   Height as of this encounter: 5' 5 (1.651 m).   Weight as of this encounter: 103.2 kg.   DVT Prophylaxis: Apixaban  Code Status: DNR Family Communication: Discussed with patient Disposition Plan: SNF when medically stable     Medications: Scheduled:  amoxicillin -clavulanate  1 tablet Oral Q12H   apixaban   5 mg Oral BID   brimonidine   1 drop Right Eye BID   And   timolol   1 drop Right Eye BID   budesonide (PULMICORT) nebulizer solution  0.5 mg Nebulization BID   Chlorhexidine  Gluconate Cloth  6 each Topical Daily   diclofenac  Sodium  2 g Topical TID AC & HS   FLUoxetine   40 mg Oral Daily   guaiFENesin   600 mg Oral BID   insulin aspart  0-5 Units Subcutaneous QHS   insulin aspart  0-9 Units Subcutaneous TID WC   ipratropium-albuterol   3 mL Nebulization Q6H   latanoprost   1 drop Right Eye QHS   levothyroxine   112 mcg Oral q morning   lidocaine   1 patch Transdermal Q24H    methylPREDNISolone  (SOLU-MEDROL ) injection  40 mg Intravenous Q12H   metoprolol  tartrate  12.5 mg Oral TID   oseltamivir   30 mg Oral Daily   sodium bicarbonate   325 mg Oral BID   sodium chloride  flush  10-40 mL Intracatheter Q12H   Continuous:  amiodarone  30 mg/hr (07/04/24 0025)   micafungin  (MYCAMINE ) 100 mg in sodium chloride  0.9 % 100 mL IVPB 100 mg (07/04/24 0904)   PRN:albuterol , calcium  carbonate, oxyCODONE , sodium chloride  flush, trimethobenzamide  Antibiotics: Anti-infectives (From admission, onward)    Start     Dose/Rate Route Frequency Ordered Stop   07/04/24 1000  micafungin  (MYCAMINE ) 100 mg in sodium chloride  0.9 % 100 mL IVPB        100 mg 105 mL/hr over 1 Hours Intravenous Every 24 hours 07/03/24 1556     07/03/24 1015  amoxicillin -clavulanate (AUGMENTIN ) 875-125 MG per tablet 1 tablet        1 tablet Oral Every 12 hours 07/03/24 0922 07/07/24 0959   07/02/24 1200  amoxicillin -clavulanate (AUGMENTIN ) 500-125 MG per tablet 1 tablet  Status:  Discontinued        1 tablet Oral 2 times daily 07/02/24 1011 07/03/24 0922   07/02/24 1100  amoxicillin -clavulanate (AUGMENTIN ) 875-125 MG per tablet 1 tablet  Status:  Discontinued        1 tablet Oral Every 12 hours 07/02/24 1011 07/02/24 1012   07/01/24 1400  piperacillin -tazobactam (ZOSYN ) IVPB 3.375 g  Status:  Discontinued        3.375 g 12.5 mL/hr over 240 Minutes Intravenous Every 8 hours 07/01/24 1023 07/02/24 1011   06/30/24 1530  micafungin  (MYCAMINE ) 100 mg in sodium chloride  0.9 % 100 mL IVPB  Status:  Discontinued        100 mg 105 mL/hr over 1 Hours Intravenous Every 24 hours 06/30/24 1444 07/03/24 1144   06/29/24 1000  vancomycin  (VANCOCIN ) IVPB 1000 mg/200 mL premix  Status:  Discontinued        1,000 mg 200 mL/hr over 60 Minutes Intravenous Every 48 hours 06/27/24 0624 06/27/24 0924   06/28/24 2200  vancomycin  (VANCOCIN ) IVPB 1000 mg/200 mL premix        1,000 mg 200 mL/hr over 60 Minutes Intravenous  Once  06/28/24 1515 06/28/24 2235   06/28/24 1000  oseltamivir  (TAMIFLU ) capsule 30 mg        30 mg Oral Daily 06/28/24 0728 07/08/24 0959   06/27/24 1200  piperacillin -tazobactam (ZOSYN ) IVPB 3.375 g  Status:  Discontinued        3.375 g 12.5 mL/hr over 240 Minutes Intravenous Every 8 hours 06/27/24 0624 06/27/24 0924   06/27/24 1200  piperacillin -tazobactam (ZOSYN ) IVPB 2.25 g  Status:  Discontinued        2.25 g 100 mL/hr over 30 Minutes Intravenous Every 8 hours 06/27/24 0924 07/01/24 1023   06/27/24 0924  vancomycin  variable dose per unstable renal function (pharmacist dosing)  Status:  Discontinued         Does not apply See admin instructions 06/27/24 0924 06/29/24 0801   06/27/24 0500  oseltamivir  (TAMIFLU ) capsule 30 mg  Status:  Discontinued        30 mg Oral  Once 06/27/24 0425  06/28/24 0728   06/27/24 0045  piperacillin -tazobactam (ZOSYN ) IVPB 3.375 g        3.375 g 100 mL/hr over 30 Minutes Intravenous  Once 06/27/24 0036 06/27/24 0219   06/27/24 0045  vancomycin  (VANCOREADY) IVPB 1500 mg/300 mL        1,500 mg 150 mL/hr over 120 Minutes Intravenous  Once 06/27/24 0036 06/27/24 0401       Objective:  Vital Signs  Vitals:   07/04/24 0700 07/04/24 0800 07/04/24 0824 07/04/24 0825  BP: 117/71 (!) 128/98    Pulse: (!) 105 (!) 109    Resp: 19 (!) 25    Temp: 98.7 F (37.1 C)     TempSrc: Oral     SpO2: 97% 98% 100% 100%  Weight:      Height:        Intake/Output Summary (Last 24 hours) at 07/04/2024 1010 Last data filed at 07/04/2024 0311 Gross per 24 hour  Intake 800 ml  Output 1200 ml  Net -400 ml   Filed Weights   07/02/24 0500 07/03/24 0526 07/04/24 0309  Weight: 103.3 kg 102.9 kg 103.2 kg    General appearance: Awake alert.  In no distress Resp: Tachypneic though less this compared to yesterday.  Wheezing bilaterally.  Few crackles at the bases.  No rhonchi. Cardio: S1-S2 is irregularly irregular  GI: Abdomen is soft.  Nontender nondistended.  Bowel sounds  are present normal.  No masses organomegaly Extremities: No edema.  Full range of motion of lower extremities.  Good deconditioning noted. No obvious focal neurological deficits.  Lab Results:  Data Reviewed: I have personally reviewed following labs and reports of the imaging studies  CBC: Recent Labs  Lab 06/28/24 0531 06/28/24 1932 06/29/24 0554 06/30/24 0555 06/30/24 1523 07/01/24 1431 07/02/24 0920 07/03/24 0552 07/04/24 0641  WBC 30.4*   < > 25.2* 16.8* 39.0*  40.5* 26.0* 22.9* 15.8* 13.1*  NEUTROABS 28.8*  --  23.3* 14.9* 35.9*  --   --   --   --   HGB 10.5*   < > 11.0* 11.0* 10.2*  10.1* 10.2* 10.3* 10.9* 11.4*  HCT 30.8*   < > 32.6* 34.2* 32.2*  31.5* 32.0* 32.9* 35.2* 37.3  MCV 90.1   < > 89.6 91.7 94.4  92.9 92.2 92.7 96.2 96.4  PLT 260   < > 304 275 235  247 203 233 193 159   < > = values in this interval not displayed.    Basic Metabolic Panel: Recent Labs  Lab 06/27/24 1138 06/27/24 1514 06/30/24 0555 06/30/24 1144 06/30/24 1523 07/01/24 0500 07/02/24 0920 07/03/24 0552  NA  --    < > 142  --  139 140 139 141  K  --    < > 3.2*  --  3.7 4.1 3.3* 4.2  CL  --    < > 98  --  95* 99 100 104  CO2  --    < > 26  --  21* 25 26 26   GLUCOSE  --    < > 136*  --  255* 147* 236* 212*  BUN  --    < > 79*  --  77* 83* 77* 70*  CREATININE  --    < > 2.89*  --  3.02* 2.90* 2.20* 1.91*  CALCIUM   --    < > 9.1  --  8.9 9.2 9.0 9.0  MG 3.1*  --   --  2.0  --   --   --   --  PHOS 5.7*  --   --   --   --   --   --   --    < > = values in this interval not displayed.    GFR: Estimated Creatinine Clearance: 33.1 mL/min (A) (by C-G formula based on SCr of 1.91 mg/dL (H)).  Liver Function Tests: Recent Labs  Lab 06/29/24 0554 06/30/24 0555 07/01/24 0500 07/02/24 0920 07/03/24 0552  AST 1,888* 755* 410* 254* 182*  ALT 2,069* 1,438* 1,037* 746* 564*  ALKPHOS 249* 208* 196* 193* 194*  BILITOT 1.1 0.9 0.8 0.8 0.7  PROT 5.9* 5.8* 5.9* 5.9* 5.9*  ALBUMIN  2.8*  2.7* 2.9* 2.8* 2.8*     BNP (last 3 results) Recent Labs    06/27/24 0002 06/27/24 0705  PROBNP 8,463.0* 67,156.9*    Recent Results (from the past 240 hours)  Resp panel by RT-PCR (RSV, Flu A&B, Covid) Anterior Nasal Swab     Status: Abnormal   Collection Time: 06/27/24 12:05 AM   Specimen: Anterior Nasal Swab  Result Value Ref Range Status   SARS Coronavirus 2 by RT PCR NEGATIVE NEGATIVE Final   Influenza A by PCR POSITIVE (A) NEGATIVE Final   Influenza B by PCR NEGATIVE NEGATIVE Final    Comment: (NOTE) The Xpert Xpress SARS-CoV-2/FLU/RSV plus assay is intended as an aid in the diagnosis of influenza from Nasopharyngeal swab specimens and should not be used as a sole basis for treatment. Nasal washings and aspirates are unacceptable for Xpert Xpress SARS-CoV-2/FLU/RSV testing.  Fact Sheet for Patients: bloggercourse.com  Fact Sheet for Healthcare Providers: seriousbroker.it  This test is not yet approved or cleared by the [place]  FDA and has been authorized for detection and/or diagnosis of SARS-CoV-2 by FDA under an Emergency Use Authorization (EUA). This EUA will remain in effect (meaning this test can be used) for the duration of the COVID-19 declaration under Section 564(b)(1) of the Act, 21 U.S.C. section 360bbb-3(b)(1), unless the authorization is terminated or revoked.     Resp Syncytial Virus by PCR NEGATIVE NEGATIVE Final    Comment: (NOTE) Fact Sheet for Patients: bloggercourse.com  Fact Sheet for Healthcare Providers: seriousbroker.it  This test is not yet approved or cleared by the [place]  FDA and has been authorized for detection and/or diagnosis of SARS-CoV-2 by FDA under an Emergency Use Authorization (EUA). This EUA will remain in effect (meaning this test can be used) for the duration of the COVID-19 declaration under Section 564(b)(1)  of the Act, 21 U.S.C. section 360bbb-3(b)(1), unless the authorization is terminated or revoked.  Performed at [facility], [address], [place], [place] [address]   Urine Culture     Status: Abnormal   Collection Time: 06/27/24  6:20 AM   Specimen: Urine, Clean Catch  Result Value Ref Range Status   Specimen Description URINE, CLEAN CATCH  Final   Special Requests   Final    NONE Performed at [facility], [address] [place], [place] [address]    Culture >=100,000 COLONIES/mL ENTEROCOCCUS FAECALIS (A)  Final   Report Status 07/01/2024 FINAL  Final   Organism ID, Bacteria ENTEROCOCCUS FAECALIS (A)  Final      Susceptibility   Enterococcus faecalis - MIC*    AMPICILLIN <=2 SENSITIVE Sensitive     NITROFURANTOIN <=16 SENSITIVE Sensitive     VANCOMYCIN  1 SENSITIVE Sensitive     * >=100,000 COLONIES/mL ENTEROCOCCUS FAECALIS  Culture, blood (Routine X 2) w Reflex to ID Panel  Status: None   Collection Time: 06/27/24  6:57 AM   Specimen: BLOOD LEFT HAND  Result Value Ref Range Status   Specimen Description BLOOD LEFT HAND  Final   Special Requests   Final    BOTTLES DRAWN AEROBIC ONLY Blood Culture results may not be optimal due to an inadequate volume of blood received in culture bottles   Culture   Final    NO GROWTH 5 DAYS Performed at [facility], [address], [place], [place] [address]    Report Status 07/02/2024 FINAL  Final  Culture, blood (Routine X 2) w Reflex to ID Panel     Status: None   Collection Time: 06/27/24  7:45 AM   Specimen: BLOOD  Result Value Ref Range Status   Specimen Description BLOOD RIGHT ANTECUBITAL  Final   Special Requests   Final    BOTTLES DRAWN AEROBIC AND ANAEROBIC Blood Culture adequate volume   Culture   Final    NO GROWTH 5 DAYS Performed at [facility], [address], [place], [place] [address]    Report Status 07/02/2024 FINAL  Final  MRSA Next Gen by PCR, Nasal     Status: None   Collection  Time: 06/27/24 11:38 AM   Specimen: Nasal Mucosa; Nasal Swab  Result Value Ref Range Status   MRSA by PCR Next Gen NOT DETECTED NOT DETECTED Final    Comment: (NOTE) The GeneXpert MRSA Assay (FDA approved for NASAL specimens only), is one component of a comprehensive MRSA colonization surveillance program. It is not intended to diagnose MRSA infection nor to guide or monitor treatment for MRSA infections. Test performance is not FDA approved in patients less than 5 years old. Performed at [facility], [address] [place], [place] [address]   Aerobic/Anaerobic Culture w Gram Stain (surgical/deep wound)     Status: None (Preliminary result)   Collection Time: 06/30/24 10:34 AM   Specimen: Path fluid; Body Fluid  Result Value Ref Range Status   Specimen Description FLUID LEFT KIDNEY  Final   Special Requests NONE  Final   Gram Stain RARE WBC SEEN NO ORGANISMS SEEN   Final   Culture   Final    NO GROWTH 3 DAYS NO ANAEROBES ISOLATED; CULTURE IN PROGRESS FOR 5 DAYS Performed at [facility], [address], [place], [place] [address]    Report Status PENDING  Incomplete  Acid Fast Smear (AFB)     Status: None   Collection Time: 06/30/24 10:34 AM   Specimen: Path fluid; Body Fluid  Result Value Ref Range Status   AFB Specimen Processing Concentration  Final   Acid Fast Smear Negative  Final    Comment: (NOTE) Performed At: [facility] [address] [place], [place] [id number] Jennette Shorter MD Ey:[id number]    Source (AFB) FLUID  Final    Comment: LEFT KIDNEY Performed at [facility], [address], [place], [place] [address]       Radiology Studies: CT ABDOMEN PELVIS WO CONTRAST Result Date: 07/04/2024 EXAM: CT ABDOMEN AND PELVIS WITHOUT CONTRAST 07/03/2024 10:02:27 AM TECHNIQUE: CT of the abdomen and pelvis was performed without the administration of intravenous contrast. Multiplanar reformatted images are provided for review. Automated  exposure control, iterative reconstruction, and/or weight-based adjustment of the mA/kV was utilized to reduce the radiation dose to as low as reasonably achievable. COMPARISON: 06/28/2024 CLINICAL HISTORY: f/u left kidney and bladder mass like lesion - concerning  for fungal infection FINDINGS: LOWER CHEST: Unchanged small left pleural effusion. Bilateral progressive airspace opacities are noted within both lower lobes and the right middle lobe, concerning for worsening multifocal infection. Axial image 5/2. LIVER: No suspicious liver abnormality. GALLBLADDER AND BILE DUCTS: Multiple small stones noted within the gallbladder, axial image 27/2. The gallbladder is decompressed without surrounding inflammatory change. No biliary ductal dilatation. SPLEEN: No acute abnormality. PANCREAS: No acute abnormality. ADRENAL GLANDS: No acute abnormality. KIDNEYS, URETERS AND BLADDER: Interval placement of left-sided percutaneous nephroureteral stent with decompression of previous hydronephrosis. Lower pole left kidney stone measures 1.1 cm. An indeterminate soft tissue density filling defect within the upper pole collecting system of the left kidney persists, measuring 2.5 x 1.7 x 1.7 cm. This measures 42 Hounsfield units on this unenhanced CT. Lack of intravenous contrast material limits further characterization of this lesion. Differential considerations for the left kidney lesion include fungal ball (mycetoma), blood clot, sloughed papilla, or a urothelial tumor. Further evaluation with contrast-enhanced CT or MRI is recommended for better characterization of the left kidney lesion. Extensive right renal cortical scarring with atrophy. The urinary bladder is decompressed around the Foley catheter. No stones in the right kidney or ureters. No right hydronephrosis. No perinephric or periureteral stranding. GI AND BOWEL: Signs of previous gastric partial gastrectomy. No bowel wall thickening, inflammation, or distention. The  appendix is visualized and appears normal. There is no bowel obstruction. PERITONEUM AND RETROPERITONEUM: No free fluid or fluid collections. No free air. VASCULATURE: Aorta is normal in caliber. Aortic atherosclerotic calcification. LYMPH NODES: No lymphadenopathy. REPRODUCTIVE ORGANS: Status post hysterectomy. No adnexal mass. BONES AND SOFT TISSUES: Right hip arthroplasty. Multilevel lumbar degenerative disc disease. No acute osseous abnormality. No focal soft tissue abnormality. IMPRESSION: 1. Indeterminate soft tissue density filling defect within the upper pole collecting system of the left kidney persists, measuring 2.5 x 1.7 x 1.7 cm (42 HU on unenhanced CT), with differential including fungal ball, blood clot, and urothelial neoplasm. Recommend further evaluation with contrast-enhanced CT urogram or renal protocol MRI, and urology consultation. 2. Interval placement of left-sided percutaneous nephroureteral stent with decompression of previous hydronephrosis, with a 1.1 cm lower pole left renal stone. 3. Bilateral progressive airspace opacities within both lower lobes and the right middle lobe, concerning for worsening multifocal infection. Electronically signed by: Waddell Calk MD 07/04/2024 07:36 AM EST RP Workstation: [id number]   DG CHEST PORT 1 VIEW Result Date: 07/02/2024 CLINICAL DATA:  Hypoxia EXAM: PORTABLE CHEST 1 VIEW COMPARISON:  June 26, 2024 FINDINGS: Stable cardiomegaly. Interval placement of right-sided PICC line with distal tip in expected position of cavoatrial junction. No definite acute pulmonary abnormality seen. IMPRESSION: Interval placement of right-sided PICC line with distal tip in expected position of cavoatrial junction. Electronically Signed   By: Lynwood Landy Raddle M.D.   On: 07/02/2024 13:31       LOS: 7 days   Semone Orlov Verdene  [facility] Pager on [email or web address]  07/04/2024, 10:10 AM   "

## 2024-07-04 NOTE — Progress Notes (Signed)
 "        [facility]  Date of Admission:  06/26/2024     Reason for Consult: ?fungal ball in kidney/bladder                             Referring Provider: Cosette         Lines:  12/24-c urethral catheter 12/24-c left ureteral stent 12/24-c picc double lumen   Abx: 12/28-c micafungin  12/30-c augmentin  12/25-c oseltamivir                                                       12/25-12/30 piptazo 12/25-27 vanc   Assessment: 69 yo female with copd on home o2, dm2 on jardiance, recent pyelo, readmitted with influenza/copd exacerbation and shock ?septic/cardiogenic (tte 30% ef), with ct imaging w/u for aki/transaminitis showing new left renal and bladder mass, concerning for fungal ball     Patient underwent on 12/28 cystoscopy and ureteroscopy with left ureteral stent placement and retrieval of the left renal mass and bladder mass. The bladder mass appear mostly necrotic and the left renal mass as well with some viable tissue   Frozen section (spoke with pathologist dr Macie Hummingbird) who reports no viable tissue on several cut of frozen section and she sees scattered cocci in the mass. The bacteria likely bystanders and unsual would expect gnr     Patient doesn't have risk factors for fungal infection outside of candida (she is on jardiance; but didn't have dka). Mucor/aspergillus would be extremely rare   Ucx e feacalis appear to be what is seen in the bladder mass frozen section. Not entirely clear if superimposed bacterial infection at this time but will keep on abx for now    Given her severe presentation although the shock could be cardiogenic from possible influenza related myocarditis vs sepsis in general, can't r/o septic process from renal yet, and will cover for gu infection       Epidemiologically speaking, micafungin  would penetrate the kidney but not bladder (however bladder mass removed), and would use this for presumed candida pyelonephritis.  Would also continue empiric piptazo for now.    Bcx on admission has been negative, but if candida invasive disease / bsi could grow in around 5 days     Also has lower back pain that is rather severe. Will see if mri suggestive of any pyogenic process     Influenza/copd exacerbation seems to be improving with supportive care from primary team. Chest ct did suggest a viral pneumonitis vs some fluid component from cardiogenic shock   Shock associated lft elevation also improving   Micro: 12/28 renal/bladder  Bacterial cx - in process Afb cx - in process Fungal cx - in process   ------------------- 07/02/24 id assessment Mri lumbar spine no sign of infection Lft improving Still on supplemental o2 and tenuous Back pain stable  No fever  Cardiology following for chf and afib/rvr. On amiodarone    Query if brief uptick in wbc due to delayed effect of prednisone  (last dose 12/29)   --------- 12/31 id assessment Ct abd pelv repeat -- no official read. Discussed with dr Cam of urology still rather full of ?infectious material burden.    12/28 pathology A. KIDNEY, LEFT, WEDGE BIOPSY:  -  Degenerated material with oxalate crystals and bacterial overgrowth  - Fungal elements are not identified   B. FUNGAL BALL:  - Degenerated material with bacterial overgrowth  - Fungal elements are not identified   I discussed with dr Cam patient stable/improving. Unclear at this time from my standpoint what the renal/cystic process is but certainly doesn't appear to be mucor. Most likely candida although pathology doesn't show any yeast/fungal element and urine cx ngtd. She was on jardiance and is diabetic and I am not aware of any bacterial process that present like this  ?noninfectious process rather unlikely given how quickly it appears   No urgency from id standpoint for repeat debridement which urology plans potentially next week  12/28 left renal/bladder sampling fungal cx,  afb cx in progress; bacterial cx ngtd 12/25 ucx e faecalis    Plan: Finish amox-clav on 07/07/24 Continue micafungin  for now -- unclear duration Influenza pneumonitis supportive care per primary team Discussed with dr Cam if further debridment of left kidney --> send again Pathology with fungal/afb stain Fungal cx Bacterial cx  Maintain droplet precaution Appreciate urology and triad care for patient  Principal Problem:   Acute on chronic respiratory failure with hypoxemia (HCC) Active Problems:   AKI (acute kidney injury)   Pyelonephritis   COPD with acute exacerbation (HCC)   Elevated troponin   UTI (urinary tract infection)   Acute HFrEF (heart failure with reduced ejection fraction) (HCC)   HFrEF (heart failure with reduced ejection fraction) (HCC)   Pressure injury of skin   Influenza A   Severe sepsis with septic shock (HCC)   Takotsubo cardiomyopathy   Allergies[1]  Scheduled Meds:  amoxicillin -clavulanate  1 tablet Oral Q12H   apixaban   5 mg Oral BID   brimonidine   1 drop Right Eye BID   And   timolol   1 drop Right Eye BID   budesonide (PULMICORT) nebulizer solution  0.5 mg Nebulization BID   Chlorhexidine  Gluconate Cloth  6 each Topical Daily   diclofenac  Sodium  2 g Topical TID AC & HS   FLUoxetine   40 mg Oral Daily   guaiFENesin   600 mg Oral BID   insulin aspart  0-5 Units Subcutaneous QHS   insulin aspart  0-9 Units Subcutaneous TID WC   ipratropium-albuterol   3 mL Nebulization Q6H   latanoprost   1 drop Right Eye QHS   levothyroxine   112 mcg Oral q morning   methylPREDNISolone  (SOLU-MEDROL ) injection  40 mg Intravenous Q12H   metoprolol  tartrate  12.5 mg Oral TID   oseltamivir   30 mg Oral Daily   sodium bicarbonate   325 mg Oral BID   sodium chloride  flush  10-40 mL Intracatheter Q12H   Continuous Infusions:  amiodarone  30 mg/hr (07/03/24 1319)   micafungin  (MYCAMINE ) 100 mg in sodium chloride  0.9 % 100 mL IVPB     PRN Meds:.albuterol , calcium   carbonate, oxyCODONE , sodium chloride  flush, trimethobenzamide   SUBJECTIVE: Wbc down to 16 Afebrile Hds Still tachy. Cards following for chf/afib-rvr Lft continues to improve nausea     Review of Systems: ROS All other ROS was negative, except mentioned above     OBJECTIVE: Vitals:   07/03/24 1628 07/03/24 1634 07/03/24 1931 07/03/24 2202  BP: (!) 117/58 (!) 117/58 111/87 126/67  Pulse:  100 (!) 109 90  Resp:   (!) 23   Temp: (!) 97.4 F (36.3 C)  97.9 F (36.6 C)   TempSrc: Axillary  Axillary   SpO2:      Weight:  Height:       Body mass index is 37.75 kg/m.  Physical Exam  General/constitutional: slightly tachypneic; conversant; on supplemental o2 HEENT: Normocephalic, PER, Conj Clear, EOMI, Oropharynx clear Neck supple CV: rrr no mrg Lungs: exp wheeze Abd: Soft, Nontender Ext: no edema Skin: No Rash Neuro: nonfocal MSK: no peripheral joint swelling/tenderness/warmth; back spines nontender  Lab Results Lab Results  Component Value Date   WBC 15.8 (H) 07/03/2024   HGB 10.9 (L) 07/03/2024   HCT 35.2 (L) 07/03/2024   MCV 96.2 07/03/2024   PLT 193 07/03/2024    Lab Results  Component Value Date   CREATININE 1.91 (H) 07/03/2024   BUN 70 (H) 07/03/2024   NA 141 07/03/2024   K 4.2 07/03/2024   CL 104 07/03/2024   CO2 26 07/03/2024    Lab Results  Component Value Date   ALT 564 (H) 07/03/2024   AST 182 (H) 07/03/2024   ALKPHOS 194 (H) 07/03/2024   BILITOT 0.7 07/03/2024      Microbiology: Recent Results (from the past 240 hours)  Resp panel by RT-PCR (RSV, Flu A&B, Covid) Anterior Nasal Swab     Status: Abnormal   Collection Time: 06/27/24 12:05 AM   Specimen: Anterior Nasal Swab  Result Value Ref Range Status   SARS Coronavirus 2 by RT PCR NEGATIVE NEGATIVE Final   Influenza A by PCR POSITIVE (A) NEGATIVE Final   Influenza B by PCR NEGATIVE NEGATIVE Final    Comment: (NOTE) The Xpert Xpress SARS-CoV-2/FLU/RSV plus assay is  intended as an aid in the diagnosis of influenza from Nasopharyngeal swab specimens and should not be used as a sole basis for treatment. Nasal washings and aspirates are unacceptable for Xpert Xpress SARS-CoV-2/FLU/RSV testing.  Fact Sheet for Patients: bloggercourse.com  Fact Sheet for Healthcare Providers: seriousbroker.it  This test is not yet approved or cleared by the [place]  FDA and has been authorized for detection and/or diagnosis of SARS-CoV-2 by FDA under an Emergency Use Authorization (EUA). This EUA will remain in effect (meaning this test can be used) for the duration of the COVID-19 declaration under Section 564(b)(1) of the Act, 21 U.S.C. section 360bbb-3(b)(1), unless the authorization is terminated or revoked.     Resp Syncytial Virus by PCR NEGATIVE NEGATIVE Final    Comment: (NOTE) Fact Sheet for Patients: bloggercourse.com  Fact Sheet for Healthcare Providers: seriousbroker.it  This test is not yet approved or cleared by the [place]  FDA and has been authorized for detection and/or diagnosis of SARS-CoV-2 by FDA under an Emergency Use Authorization (EUA). This EUA will remain in effect (meaning this test can be used) for the duration of the COVID-19 declaration under Section 564(b)(1) of the Act, 21 U.S.C. section 360bbb-3(b)(1), unless the authorization is terminated or revoked.  Performed at [facility], [address] [place], [place] [address]   Urine Culture     Status: Abnormal   Collection Time: 06/27/24  6:20 AM   Specimen: Urine, Clean Catch  Result Value Ref Range Status   Specimen Description URINE, CLEAN CATCH  Final   Special Requests   Final    NONE Performed at [facility], [address], [place], [place] [address]    Culture >=100,000 COLONIES/mL ENTEROCOCCUS FAECALIS (A)  Final   Report Status 07/01/2024  FINAL  Final   Organism ID, Bacteria ENTEROCOCCUS FAECALIS (A)  Final      Susceptibility   Enterococcus faecalis - MIC*    AMPICILLIN <=2  SENSITIVE Sensitive     NITROFURANTOIN <=16 SENSITIVE Sensitive     VANCOMYCIN  1 SENSITIVE Sensitive     * >=100,000 COLONIES/mL ENTEROCOCCUS FAECALIS  Culture, blood (Routine X 2) w Reflex to ID Panel     Status: None   Collection Time: 06/27/24  6:57 AM   Specimen: BLOOD LEFT HAND  Result Value Ref Range Status   Specimen Description BLOOD LEFT HAND  Final   Special Requests   Final    BOTTLES DRAWN AEROBIC ONLY Blood Culture results may not be optimal due to an inadequate volume of blood received in culture bottles   Culture   Final    NO GROWTH 5 DAYS Performed at [facility], [address], [place], [place] [address]    Report Status 07/02/2024 FINAL  Final  Culture, blood (Routine X 2) w Reflex to ID Panel     Status: None   Collection Time: 06/27/24  7:45 AM   Specimen: BLOOD  Result Value Ref Range Status   Specimen Description BLOOD RIGHT ANTECUBITAL  Final   Special Requests   Final    BOTTLES DRAWN AEROBIC AND ANAEROBIC Blood Culture adequate volume   Culture   Final    NO GROWTH 5 DAYS Performed at [facility], [address], [place], [place] [address]    Report Status 07/02/2024 FINAL  Final  MRSA Next Gen by PCR, Nasal     Status: None   Collection Time: 06/27/24 11:38 AM   Specimen: Nasal Mucosa; Nasal Swab  Result Value Ref Range Status   MRSA by PCR Next Gen NOT DETECTED NOT DETECTED Final    Comment: (NOTE) The GeneXpert MRSA Assay (FDA approved for NASAL specimens only), is one component of a comprehensive MRSA colonization surveillance program. It is not intended to diagnose MRSA infection nor to guide or monitor treatment for MRSA infections. Test performance is not FDA approved in patients less than 9 years old. Performed at [facility], [address], [place], [place] [address]    Aerobic/Anaerobic Culture w Gram Stain (surgical/deep wound)     Status: None (Preliminary result)   Collection Time: 06/30/24 10:34 AM   Specimen: Path fluid; Body Fluid  Result Value Ref Range Status   Specimen Description FLUID LEFT KIDNEY  Final   Special Requests NONE  Final   Gram Stain RARE WBC SEEN NO ORGANISMS SEEN   Final   Culture   Final    NO GROWTH 3 DAYS NO ANAEROBES ISOLATED; CULTURE IN PROGRESS FOR 5 DAYS Performed at [facility], [address] [place], [place] [address]    Report Status PENDING  Incomplete  Acid Fast Smear (AFB)     Status: None   Collection Time: 06/30/24 10:34 AM   Specimen: Path fluid; Body Fluid  Result Value Ref Range Status   AFB Specimen Processing Concentration  Final   Acid Fast Smear Negative  Final    Comment: (NOTE) Performed At: [facility] [address] [place], [place] [id number] Jennette Shorter MD Ey:[id number]    Source (AFB) FLUID  Final    Comment: LEFT KIDNEY Performed at [facility], [address] [place], [place] [address]      Serology:   Imaging: If present, new imagings (plain films, ct scans, and mri) have been personally visualized and interpreted; radiology reports have been reviewed. Decision making incorporated into the Impression / Recommendations.  12/30 mri lumbar spine 1. Mild  dextroscoliosis of the thoracolumbar spine. 2. Grade 1 degenerative anterolisthesis at L3-4 and L4-5. 3. Mild central spinal canal stenosis and moderate right neural foraminal stenosis at L4-5. 4. Mild-to-moderate central spinal canal stenosis at L3-4. 5. Mild central spinal canal stenosis at T12-L1.  12/31 ct abd pelv in progress   Constance ONEIDA Passer, MD [facility] [phone] pager    07/04/2024, 12:04 AM      [1]  Allergies Allergen Reactions   Crestor  [Rosuvastatin  Calcium ] Other (See Comments)    Muscle aches     Hctz  [Hydrochlorothiazide] Other (See Comments)    Pt doesn't remember   Lipitor [Atorvastatin] Other (See Comments)    Muscle aches    "

## 2024-07-04 DEATH — deceased

## 2024-07-05 ENCOUNTER — Inpatient Hospital Stay (HOSPITAL_COMMUNITY)

## 2024-07-05 ENCOUNTER — Encounter (HOSPITAL_COMMUNITY): Admission: EM | Disposition: E | Payer: Self-pay | Source: Home / Self Care | Attending: Internal Medicine

## 2024-07-05 DIAGNOSIS — J101 Influenza due to other identified influenza virus with other respiratory manifestations: Secondary | ICD-10-CM | POA: Diagnosis not present

## 2024-07-05 DIAGNOSIS — I4891 Unspecified atrial fibrillation: Secondary | ICD-10-CM

## 2024-07-05 DIAGNOSIS — D689 Coagulation defect, unspecified: Secondary | ICD-10-CM | POA: Diagnosis not present

## 2024-07-05 DIAGNOSIS — N179 Acute kidney failure, unspecified: Secondary | ICD-10-CM | POA: Diagnosis not present

## 2024-07-05 DIAGNOSIS — J441 Chronic obstructive pulmonary disease with (acute) exacerbation: Secondary | ICD-10-CM | POA: Diagnosis not present

## 2024-07-05 DIAGNOSIS — Z515 Encounter for palliative care: Secondary | ICD-10-CM | POA: Diagnosis not present

## 2024-07-05 DIAGNOSIS — A419 Sepsis, unspecified organism: Secondary | ICD-10-CM | POA: Diagnosis not present

## 2024-07-05 DIAGNOSIS — J1008 Influenza due to other identified influenza virus with other specified pneumonia: Secondary | ICD-10-CM | POA: Diagnosis not present

## 2024-07-05 DIAGNOSIS — L89812 Pressure ulcer of head, stage 2: Secondary | ICD-10-CM | POA: Diagnosis not present

## 2024-07-05 DIAGNOSIS — I4819 Other persistent atrial fibrillation: Secondary | ICD-10-CM | POA: Diagnosis not present

## 2024-07-05 DIAGNOSIS — I5043 Acute on chronic combined systolic (congestive) and diastolic (congestive) heart failure: Secondary | ICD-10-CM | POA: Diagnosis not present

## 2024-07-05 DIAGNOSIS — Z66 Do not resuscitate: Secondary | ICD-10-CM | POA: Diagnosis not present

## 2024-07-05 DIAGNOSIS — I5033 Acute on chronic diastolic (congestive) heart failure: Secondary | ICD-10-CM

## 2024-07-05 DIAGNOSIS — J9621 Acute and chronic respiratory failure with hypoxia: Secondary | ICD-10-CM | POA: Diagnosis not present

## 2024-07-05 DIAGNOSIS — K7200 Acute and subacute hepatic failure without coma: Secondary | ICD-10-CM | POA: Diagnosis not present

## 2024-07-05 DIAGNOSIS — R6521 Severe sepsis with septic shock: Secondary | ICD-10-CM | POA: Diagnosis not present

## 2024-07-05 DIAGNOSIS — I340 Nonrheumatic mitral (valve) insufficiency: Secondary | ICD-10-CM

## 2024-07-05 DIAGNOSIS — I5020 Unspecified systolic (congestive) heart failure: Secondary | ICD-10-CM | POA: Diagnosis not present

## 2024-07-05 DIAGNOSIS — G9341 Metabolic encephalopathy: Secondary | ICD-10-CM | POA: Diagnosis not present

## 2024-07-05 DIAGNOSIS — J159 Unspecified bacterial pneumonia: Secondary | ICD-10-CM | POA: Diagnosis not present

## 2024-07-05 DIAGNOSIS — I110 Hypertensive heart disease with heart failure: Secondary | ICD-10-CM

## 2024-07-05 DIAGNOSIS — R7989 Other specified abnormal findings of blood chemistry: Secondary | ICD-10-CM | POA: Diagnosis not present

## 2024-07-05 DIAGNOSIS — J449 Chronic obstructive pulmonary disease, unspecified: Secondary | ICD-10-CM

## 2024-07-05 HISTORY — PX: CARDIOVERSION: EP1203

## 2024-07-05 HISTORY — PX: TRANSESOPHAGEAL ECHOCARDIOGRAM (CATH LAB): EP1270

## 2024-07-05 LAB — CBC
HCT: 34.7 % — ABNORMAL LOW (ref 36.0–46.0)
Hemoglobin: 10.9 g/dL — ABNORMAL LOW (ref 12.0–15.0)
MCH: 30.1 pg (ref 26.0–34.0)
MCHC: 31.4 g/dL (ref 30.0–36.0)
MCV: 95.9 fL (ref 80.0–100.0)
Platelets: 217 K/uL (ref 150–400)
RBC: 3.62 MIL/uL — ABNORMAL LOW (ref 3.87–5.11)
RDW: 18.1 % — ABNORMAL HIGH (ref 11.5–15.5)
WBC: 17.2 K/uL — ABNORMAL HIGH (ref 4.0–10.5)
nRBC: 0.3 % — ABNORMAL HIGH (ref 0.0–0.2)

## 2024-07-05 LAB — ECHO TEE
Est EF: 50
P 1/2 time: 350 ms

## 2024-07-05 LAB — COMPREHENSIVE METABOLIC PANEL WITH GFR
ALT: 318 U/L — ABNORMAL HIGH (ref 0–44)
AST: 98 U/L — ABNORMAL HIGH (ref 15–41)
Albumin: 2.9 g/dL — ABNORMAL LOW (ref 3.5–5.0)
Alkaline Phosphatase: 185 U/L — ABNORMAL HIGH (ref 38–126)
Anion gap: 9 (ref 5–15)
BUN: 60 mg/dL — ABNORMAL HIGH (ref 8–23)
CO2: 29 mmol/L (ref 22–32)
Calcium: 9.3 mg/dL (ref 8.9–10.3)
Chloride: 105 mmol/L (ref 98–111)
Creatinine, Ser: 1.3 mg/dL — ABNORMAL HIGH (ref 0.44–1.00)
GFR, Estimated: 44 mL/min — ABNORMAL LOW
Glucose, Bld: 181 mg/dL — ABNORMAL HIGH (ref 70–99)
Potassium: 4.9 mmol/L (ref 3.5–5.1)
Sodium: 142 mmol/L (ref 135–145)
Total Bilirubin: 0.7 mg/dL (ref 0.0–1.2)
Total Protein: 6 g/dL — ABNORMAL LOW (ref 6.5–8.1)

## 2024-07-05 LAB — AEROBIC/ANAEROBIC CULTURE W GRAM STAIN (SURGICAL/DEEP WOUND): Culture: NO GROWTH

## 2024-07-05 LAB — GLUCOSE, CAPILLARY
Glucose-Capillary: 177 mg/dL — ABNORMAL HIGH (ref 70–99)
Glucose-Capillary: 145 mg/dL — ABNORMAL HIGH (ref 70–99)
Glucose-Capillary: 148 mg/dL — ABNORMAL HIGH (ref 70–99)
Glucose-Capillary: 176 mg/dL — ABNORMAL HIGH (ref 70–99)

## 2024-07-05 MED ORDER — PHENYLEPHRINE HCL (PRESSORS) 10 MG/ML IV SOLN
INTRAVENOUS | Status: DC | PRN
  Administered 2024-07-05: 160 ug via INTRAVENOUS

## 2024-07-05 MED ORDER — PROPOFOL 10 MG/ML IV BOLUS
INTRAVENOUS | Status: DC | PRN
  Administered 2024-07-05: 20 mg via INTRAVENOUS
  Administered 2024-07-05: 10 mg via INTRAVENOUS
  Administered 2024-07-05: 20 mg via INTRAVENOUS

## 2024-07-05 MED ORDER — PROPOFOL 500 MG/50ML IV EMUL
INTRAVENOUS | Status: DC | PRN
  Administered 2024-07-05: 125 ug/kg/min via INTRAVENOUS

## 2024-07-05 MED ORDER — EPHEDRINE SULFATE (PRESSORS) 25 MG/5ML IV SOSY
PREFILLED_SYRINGE | INTRAVENOUS | Status: DC | PRN
  Administered 2024-07-05: 10 mg via INTRAVENOUS
  Administered 2024-07-05: 7.5 mg via INTRAVENOUS

## 2024-07-05 MED ORDER — PHENYLEPHRINE HCL-NACL 20-0.9 MG/250ML-% IV SOLN
INTRAVENOUS | Status: DC | PRN
  Administered 2024-07-05: 25 ug/min via INTRAVENOUS

## 2024-07-05 MED ORDER — SODIUM CHLORIDE 0.9 % IV SOLN: INTRAVENOUS | Status: DC

## 2024-07-05 MED ORDER — FUROSEMIDE 10 MG/ML IJ SOLN
40.0000 mg | Freq: Once | INTRAMUSCULAR | Status: AC
  Administered 2024-07-05: 40 mg via INTRAVENOUS
  Filled 2024-07-05: qty 4

## 2024-07-05 MED ORDER — OSELTAMIVIR PHOSPHATE 30 MG PO CAPS
30.0000 mg | ORAL_CAPSULE | Freq: Two times a day (BID) | ORAL | Status: AC
  Administered 2024-07-05 – 2024-07-07 (×3): 30 mg via ORAL
  Filled 2024-07-05 (×4): qty 1

## 2024-07-05 NOTE — Anesthesia Preprocedure Evaluation (Signed)
"                                    Anesthesia Evaluation  Patient identified by MRN, date of birth, ID band Patient awake    Reviewed: Allergy & Precautions, NPO status , Patient's Chart, lab work & pertinent test results, reviewed documented beta blocker date and time   History of Anesthesia Complications Negative for: history of anesthetic complications  Airway Mallampati: II  TM Distance: >3 FB     Dental  (+) Edentulous Upper, Edentulous Lower   Pulmonary shortness of breath, COPD,  oxygen dependent, Recent URI     + decreased breath sounds  rales    Cardiovascular hypertension, + Peripheral Vascular Disease and +CHF   Rhythm:Regular Rate:Normal  IMPRESSIONS     1. Apical akinesis, pattern could suggest stress induced cardiomyopathy.  Cannot exlcude possible LAD ischemia. . Left ventricular ejection  fraction, by estimation, is 30 to 35%. The left ventricle has moderately  decreased function. The left ventricle  demonstrates regional wall motion abnormalities (see scoring  diagram/findings for description). There is moderate left ventricular  hypertrophy. Left ventricular diastolic parameters are consistent with  Grade II diastolic dysfunction  (pseudonormalization). Elevated left atrial pressure.   2. Right ventricular systolic function is normal. The right ventricular  size is normal.   3. The mitral valve is abnormal. Trivial mitral valve regurgitation. No  evidence of mitral stenosis.   4. The aortic valve was not well visualized. There is mild calcification  of the aortic valve. There is mild thickening of the aortic valve. Aortic  valve regurgitation is moderate.   5. The inferior vena cava is normal in size with <50% respiratory  variability, suggesting right atrial pressure of 8 mmHg.      Neuro/Psych neg Seizures PSYCHIATRIC DISORDERS Anxiety Depression       GI/Hepatic ,GERD  ,,(+) neg Cirrhosis        Endo/Other  Hypothyroidism     Renal/GU Renal disease     Musculoskeletal  (+) Arthritis ,    Abdominal   Peds  Hematology  (+) Blood dyscrasia, anemia   Anesthesia Other Findings   Reproductive/Obstetrics                              Anesthesia Physical Anesthesia Plan  ASA: 3  Anesthesia Plan: MAC   Post-op Pain Management:    Induction: Intravenous  PONV Risk Score and Plan: 2 and Ondansetron  and Propofol  infusion  Airway Management Planned: Natural Airway, Nasal Cannula and Nasal CPAP  Additional Equipment:   Intra-op Plan:   Post-operative Plan:   Informed Consent: I have reviewed the patients History and Physical, chart, labs and discussed the procedure including the risks, benefits and alternatives for the proposed anesthesia with the patient or authorized representative who has indicated his/her understanding and acceptance.     Dental advisory given  Plan Discussed with: CRNA  Anesthesia Plan Comments:          Anesthesia Quick Evaluation  "

## 2024-07-05 NOTE — TOC Progression Note (Signed)
 Transition of Care [facility]) - Progression Note    Patient Details  Name: Lauren Clark MRN: [id number] Date of Birth: Jun 25, 1956  Transition of Care [facility] CM/SW Contact  Gwenn Frieze [place], [place] Phone Number: 07/05/2024, 10:46 AM  Clinical Narrative:  Per MD, not medically stable for dc to SNF and likely here through weekend. SW will need to submit for SNF auth closer to dc. Pt has a bed at [facility] pending auth and medical clearance.   Frieze Gwenn, MSW, LCSW [phone] (coverage)      Expected Discharge Plan: Skilled Nursing Facility Barriers to Discharge: Continued Medical Work up               Expected Discharge Plan and Services       Living arrangements for the past 2 months: Apartment                                       Social Drivers of Health (SDOH) Interventions SDOH Screenings   Food Insecurity: Food Insecurity Present (06/27/2024)  Housing: Low Risk (06/27/2024)  Transportation Needs: Unmet Transportation Needs (06/27/2024)  Utilities: Not At Risk (06/27/2024)  Social Connections: Moderately Integrated (06/27/2024)  Tobacco Use: Low Risk (06/30/2024)    Readmission Risk Interventions     No data to display

## 2024-07-05 NOTE — Progress Notes (Signed)
 Physical Therapy Treatment Patient Details Name: Lauren Clark MRN: [id number] DOB: 08-12-1955 Today's Date: 07/05/2024   History of Present Illness 69 y.o. female presents to [facility] 06/26/24 on same day as d/c from hospital due to worsening SOB and fall. Trauma scans negative. Admitted with acute COPD exacerbation 2/2 influenza A, UTI+, acute on chronic hypoxemic respiratory failure. CT abdomen showed L hydroureteronephrosis, high density material in L kidney, lamellated stone in bladder, and cholelithiasis. 12/28 s/p cystoscopy and ureteroscopy w/ L uretal stent placement and retrieval of L renal mass and bladder mass. PMH anemia, anxiety, aortic dissection COPD, arthritis, depression, glaucoma, blind L eye, limited vision r eye, obese, PVD, gastric sleeve, R THA, HTN, HLD, CKD III,CAD, afib on eliquis , HFpEF, aortic aneurysm.    PT Comments  Pt received in supine and agreeable to limited PT session. Pt declined OOB mobility due to wanting to rest for her procedure this afternoon. Pt was agreeable to sit on the EOB and perform LE exercises. ModA needed for bed mobility with increased time to rest in between movements. Able to perform 2x10 reps for 3 LE exercises before requesting to return to supine. Continue to recommend <3hrs post acute rehab with acute PT to follow.     If plan is discharge home, recommend the following: A lot of help with walking and/or transfers;A lot of help with bathing/dressing/bathroom;Assistance with cooking/housework;Assist for transportation;Help with stairs or ramp for entrance   Can travel by private vehicle     No  Equipment Recommendations  Wheelchair (measurements PT);Wheelchair cushion (measurements PT);BSC/3in1       Precautions / Restrictions Precautions Precautions: Fall Recall of Precautions/Restrictions: Intact Precaution/Restrictions Comments: watch O2, blind L eye w/ limited vision R eye Restrictions Weight Bearing Restrictions Per Provider Order: No      Mobility  Bed Mobility Overal bed mobility: Needs Assistance Bed Mobility: Supine to Sit, Sit to Supine    Supine to sit: Mod assist, HOB elevated, Used rails Sit to supine: Mod assist   General bed mobility comments: slight assist to guide BLE off EOB. Assist to raise trunk and shift hips forward using bed pad. ModA for return to supine for BLE management. TotalAx2 needed to scoot towards HOB in supine    Transfers  General transfer comment: pt declined      Balance Overall balance assessment: Needs assistance Sitting-balance support: Feet supported, Bilateral upper extremity supported Sitting balance-Leahy Scale: Fair       Hotel Manager: No apparent difficulties  Cognition Arousal: Alert Behavior During Therapy: WFL for tasks assessed/performed   PT - Cognitive impairments: No apparent impairments    Following commands: Intact      Cueing Cueing Techniques: Verbal cues, Tactile cues, Visual cues  Exercises General Exercises - Lower Extremity Ankle Circles/Pumps: AROM, Both, 20 reps, Seated (2x10) Long Arc Quad: AROM, Seated, Both, 20 reps (2x10) Hip Flexion/Marching: AROM, Both, Seated, 20 reps (2x10)        Pertinent Vitals/Pain Pain Assessment Pain Assessment: Faces Faces Pain Scale: Hurts little more Pain Location: all over Pain Descriptors / Indicators: Discomfort, Grimacing Pain Intervention(s): Limited activity within patient's tolerance, Monitored during session, Repositioned     PT Goals (current goals can now be found in the care plan section) Acute Rehab PT Goals PT Goal Formulation: With patient Time For Goal Achievement: 07/12/24 Potential to Achieve Goals: Good Progress towards PT goals: Progressing toward goals    Frequency    Min 2X/week       AM-PAC PT  6 Clicks Mobility   Outcome Measure  Help needed turning from your back to your side while in a flat bed without using bedrails?: A Lot Help  needed moving from lying on your back to sitting on the side of a flat bed without using bedrails?: A Lot Help needed moving to and from a bed to a chair (including a wheelchair)?: A Lot Help needed standing up from a chair using your arms (e.g., wheelchair or bedside chair)?: A Lot Help needed to walk in hospital room?: Total Help needed climbing 3-5 steps with a railing? : Total 6 Click Score: 10    End of Session Equipment Utilized During Treatment: Oxygen Activity Tolerance: Patient limited by fatigue Patient left: in bed;with call bell/phone within reach;with bed alarm set Nurse Communication: Mobility status PT Visit Diagnosis: Unsteadiness on feet (R26.81);Other abnormalities of gait and mobility (R26.89);Muscle weakness (generalized) (M62.81)     Time: 9074-9054 PT Time Calculation (min) (ACUTE ONLY): 20 min  Charges:    $Therapeutic Exercise: 8-22 mins PT General Charges $$ ACUTE PT VISIT: 1 Visit                    Kate ORN, PT, DPT Secure Chat Preferred  Rehab Office [phone]   Kate BRAVO Wendolyn 07/05/2024, 10:42 AM

## 2024-07-05 NOTE — Transfer of Care (Signed)
 Immediate Anesthesia Transfer of Care Note  Patient: Lauren Clark  Procedure(s) Performed: TRANSESOPHAGEAL ECHOCARDIOGRAM CARDIOVERSION  Patient Location: PACU and Cath Lab  Anesthesia Type:MAC  Level of Consciousness: drowsy  Airway & Oxygen Therapy: Patient Spontanous Breathing and Patient connected to nasal cannula oxygen  Post-op Assessment: Report given to RN and Post -op Vital signs reviewed and stable  Post vital signs: Reviewed and stable  Last Vitals:  Vitals Value Taken Time  BP 97/47 07/05/24 1357  Temp 36.7 C 07/05/24 13:53  Pulse 56 07/05/24 1357  Resp 12 07/05/24 1357  SpO2 94 % 07/05/24 1357    Last Pain:  Vitals:   07/05/24 1353  TempSrc: Tympanic  PainSc: Asleep         Complications: There were no known notable events for this encounter.

## 2024-07-05 NOTE — Op Note (Signed)
 Procedure: Electrical Cardioversion Indications:  Atrial Fibrillation  Procedure Details:  Consent: Risks of procedure as well as the alternatives and risks of each were explained to the (patient/caregiver).  Consent for procedure obtained.  Time Out: Verified patient identification, verified procedure, site/side was marked, verified correct patient position, special equipment/implants available, medications/allergies/relevent history reviewed, required imaging and test results available.  Performed  Patient placed on cardiac monitor, pulse oximetry, supplemental oxygen as necessary.  Sedation given: propofol  IV, Dr. Keneth Pacer pads placed anterior and posterior chest.  Cardioverted 1 time(s).  Cardioversion with synchronized biphasic 200J shock.  Evaluation: Findings: Post procedure EKG shows: NSR Complications: None Patient did tolerate procedure well.  Time Spent Directly with the Patient:  15 minutes   Dragon Thrush 07/05/2024, 1:52 PM

## 2024-07-05 NOTE — Op Note (Signed)
 INDICATIONS: left ventricular dysfunction and atrial fibrillation pre-cardioversion  PROCEDURE:   Informed consent was obtained prior to the procedure. The risks, benefits and alternatives for the procedure were discussed and the patient comprehended these risks.  Risks include, but are not limited to, cough, sore throat, vomiting, nausea, somnolence, esophageal and stomach trauma or perforation, bleeding, low blood pressure, aspiration, pneumonia, infection, trauma to the teeth and death.     During this procedure the patient was administered IV propofol  by Anesthesiology, Dr. Keneth.  The transesophageal probe was inserted in the esophagus and stomach without difficulty and multiple views were obtained.  The patient was kept under observation until the patient left the procedure room.  The patient left the procedure room in stable condition.   Agitated microbubble saline contrast was administered.  COMPLICATIONS:    There were no immediate complications.  FINDINGS:  No left atrial thrombus. 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 Overall aortic root diameter 5.3 cm. Moderate AI. Mildl depressed RV function.  RECOMMENDATIONS:     Proceed with DC cardioversion.  Time Spent Directly with the Patient:  45 minutes   Stacy Sailer 07/05/2024, 1:50 PM

## 2024-07-05 NOTE — Progress Notes (Signed)
 Pts O2 sats 80% on RA. Upon entering the room, Wilcox was on top of pt's head and she's talking on the phone. Pt refused to put the  back in her nose and demanded seeing a doctor. Primary RN, MD Verdene, and PA Adams notified.

## 2024-07-05 NOTE — Plan of Care (Signed)
" °  Problem: Clinical Measurements: Goal: Ability to maintain clinical measurements within normal limits will improve Outcome: Not Progressing Goal: Respiratory complications will improve Outcome: Not Progressing Goal: Cardiovascular complication will be avoided Outcome: Not Progressing   Problem: Activity: Goal: Risk for activity intolerance will decrease Outcome: Not Progressing   Problem: Elimination: Goal: Will not experience complications related to urinary retention Outcome: Not Progressing   Problem: Pain Managment: Goal: General experience of comfort will improve and/or be controlled Outcome: Not Progressing   "

## 2024-07-05 NOTE — Progress Notes (Signed)
 PHARMACY NOTE:  ANTIMICROBIAL RENAL DOSAGE ADJUSTMENT  Current antimicrobial regimen includes a mismatch between antimicrobial dosage and estimated renal function.  As per policy approved by the Pharmacy & Therapeutics and Medical Executive Committees, the antimicrobial dosage will be adjusted accordingly.  Current antimicrobial dosage:  Oseltamivir  30 mg daily  Indication: Influenza A (+)  Renal Function:  Estimated Creatinine Clearance: 48.1 mL/min (A) (by C-G formula based on SCr of 1.3 mg/dL (H)). []      On intermittent HD, scheduled: []      On CRRT    Antimicrobial dosage has been changed to:  Oseltamivir  30 mg BID  Additional comments:  Thank you for allowing pharmacy to be a part of this patient's care.  Morna Breach, PharmD, BCPS PGY2 Cardiology Pharmacy Resident 07/05/2024 1:38 PM

## 2024-07-05 NOTE — Progress Notes (Signed)
 Notified that pt.was on phone and refusing to wear oxygen, pt.aware her oxygen level was down to 81%.  Pt. Called to desk and said she would wear oxygen after a few minutes. Pt. Continues to call desk asking same questions over and over after getting answers.

## 2024-07-05 NOTE — Progress Notes (Addendum)
 "  Progress Note  Patient Name: Lauren Clark Date of Encounter: 07/05/2024 [facility] Cardiologist: Vinie JAYSON Maxcy, MD   Interval Summary   Patient was laying in bed during interview.  She was upset because she had difficulty getting her breathing/nebulizer mask off.  Patient was also upset that she was not allowed to eat or drink anything.  I explained to the patient that this was because we are planning to do a TEE cardioversion.  Informed the patient that the TEE cardioversion was scheduled for about 12:51 PM and that if she wanted to do the procedure she would need to remain n.p.o. until then.   Vital Signs Vitals:   07/05/24 0346 07/05/24 0717 07/05/24 0718 07/05/24 0745  BP: 126/83   121/70  Pulse: (!) 112 (!) 137 (!) 128   Resp: (!) 27 19 (!) 22 (!) 24  Temp: 97.7 F (36.5 C)     TempSrc: Oral     SpO2: 99% 98% 98%   Weight: 101.1 kg     Height:        Intake/Output Summary (Last 24 hours) at 07/05/2024 0803 Last data filed at 07/05/2024 0700 Gross per 24 hour  Intake 1330.3 ml  Output 2050 ml  Net -719.7 ml      07/05/2024    3:46 AM 07/04/2024    3:09 AM 07/03/2024    5:26 AM  Last 3 Weights  Weight (lbs) 222 lb 14.2 oz 227 lb 8.2 oz 226 lb 13.7 oz  Weight (kg) 101.1 kg 103.2 kg 102.9 kg      Telemetry/ECG  Atrial fibrillation with ventricular rates in the 90s to 110s- Personally Reviewed  Physical Exam  GEN: No acute distress.   Neck: Difficult to assess JVD due to body habitus Cardiac: Irregular rhythm, no murmurs, rubs, or gallops.  Respiratory: Wheezing heard throughout the lungs.  Also may have some crackles at the lung bases. GI: Soft, nontender, non-distended  MS: No edema  Assessment & Plan  [name] is a 69 y.o. female with HFpEF, CKD 3B, paroxysmal AF, morbid obesity s/p gastric sleeve, prior DVT, HTN, HLD, TAA, hypothyroidism, nonobstructive CAD who presents with ADHF in the setting of flu A+ and newly reduced LVEF (55-60%->30-35%)  consistent with possible stress-induced cardiomyopathy.     Septic shock -resolved UTI Flu A Multiple organ failure - resolved AKI On admission there were concerns for septic shock, and leukocytosis.  The patient was also positive for flu A.  CT of the abdomen found a density in the left send the upper pole calyx. 06/30/2024 the patient underwent a cystoscopy and was found to have necrotic tissue in her bladder and kidney.  Cultures are pending.  Urology is considering getting a follow-up cystoscopy. She is currently on Zosyn  and Mycamine .  Because of concerns for a fungal UTI. Creatinine peaked at about 3.68.  Creatinine on 07/05/24 was down to 1.30.  Management per urology, primary, and ID.     Acute HFrEF Combined diastolic and systolic heart failure. TTE on 06/27/2024 showed a reduced LVEF of 30 to 35%, apical akinesis, G2 DD, elevated LAP, normal RV systolic function, moderate AR, and IVC with less than 50% respiratory variability. Previous TTE on 05/2024 showed a normal LVEF of 55 to 60%. Given overall clinical picture mentioned above was previously suspected that the acute decline in systolic function is stress cardiomyopathy. I's and O's are net out 3 L since admission.  Most recent weight was 101.1 kgs.  On 06/04/2024 the patient weighed 97.5 kg's.  Difficult to assess volume status due to body habitus. Ordered 1 dose of 40 mg IV Lasix  for this afternoon. GDMT   Plan to recheck echo in 2 to 3 months.  May consider ischemic evaluation at that time if LVEF remains reduced. Will avoid SGLT2 due to concerns of fungal UTI. May consider starting Entresto 24-26 mg BID.     Elevated high-sensitivity troponins 157 > 323 > 365 > 77 > 69 Suspect is secondary to demand ischemia from septic shock     Persistent A-fib with RVR Heart rates are in the 100s to 110s.  Rate control is limited by hypotension.  Was started on IV amiodarone . Has received about 4.1 gm of IV amiodarone . Continue IV  amiodarone . May consider transitioning to oral amiodarone . Continue metoprolol  tartrate 25 mg 3 times daily. Planning for a TEE cardioversion today.     Elevated LFTs ALT peaked at 2684.  They are downtrending. Management per primary     Otherwise management per primary    For questions or updates, please contact [facility] Please consult [email or web address] for contact info under        Signed, Morse Clause, PA-C   History and all data above reviewed.  I personally took the history today, performed the physical exam and formulated the substantive portion of the assessment and plan.  I reviewed all relevant tests and studies. Patient examined.  I agree with the findings as above.  She is somnolent but she is arousable.  She denies any chest pain .  Breathing seems to be better than when I saw her two days ago.  Lying flt. The patient exam reveals COR: Irregular   ,  Lungs: Diffuse scattered wheezing  ,  Abd: Positive bowel sounds, no rebound no guarding, Ext Mild leg edema  .  All available labs, radiology testing, previous records reviewed. Agree with documented assessment and plan.   Atrial fib:  Plan is for TEE/DCCV today.  Note, if she has any invasive procedures following this she would need bridging anticoagulation.  Probably change to PO amio tomorrow.  Acute systolic and diastolic HF:  Tolerated a low dose of diuretic yesterday.  Give another dose today.  Follow creat.    [name]  11:23 AM  07/05/2024  "

## 2024-07-05 NOTE — Progress Notes (Signed)
 Occupational Therapy Treatment Patient Details Name: Lauren Clark MRN: [id number] DOB: [birth year] Today's Date: 07/05/2024   History of present illness 69 y.o. female presents to [facility]/26/24 on same day as d/c from hospital due to worsening SOB and fall. Trauma scans negative. Admitted with acute COPD exacerbation 2/2 influenza A, UTI+, acute on chronic hypoxemic respiratory failure. CT abdomen showed L hydroureteronephrosis, high density material in L kidney, lamellated stone in bladder, and cholelithiasis. 12/28 s/p cystoscopy and ureteroscopy w/ L uretal stent placement and retrieval of L renal mass and bladder mass. PMH anemia, anxiety, aortic dissection COPD, arthritis, depression, glaucoma, blind L eye, limited vision r eye, obese, PVD, gastric sleeve, R THA, HTN, HLD, CKD III,CAD, afib on eliquis , HFpEF, aortic aneurysm.   OT comments  Pt. Seen for skilled OT treatment session.  Pt. Declined eob/oob due to pending procedure later today.  Pt. Agreeable to bed level tx session.  Pt. Completed bed level grooming tasks with set up.  Agree with current d/c recommendations.  Cont. With acute OT POC.        If plan is discharge home, recommend the following:  A lot of help with walking and/or transfers;A lot of help with bathing/dressing/bathroom;Assistance with cooking/housework;Assist for transportation;Help with stairs or ramp for entrance   Equipment Recommendations       Recommendations for Other Services      Precautions / Restrictions Precautions Precautions: Fall Recall of Precautions/Restrictions: Intact Precaution/Restrictions Comments: watch O2, blind L eye w/ limited vision R eye       Mobility Bed Mobility               General bed mobility comments: pt. declined eob/oob    Transfers                   General transfer comment: pt declined     Balance                                           ADL either performed or assessed with  clinical judgement   ADL Overall ADL's : Needs assistance/impaired     Grooming: Wash/dry hands;Wash/dry face;Bed level;Set up Grooming Details (indicate cue type and reason): pt. declined sitting eob secondary to not feeling well and also not wanting to move prior to pending procedure later today                               General ADL Comments: pt. declined eob requesting bed level in preparation for pending procedure later today    Extremity/Trunk Assessment              Vision       Perception     Praxis     Communication Communication Communication: No apparent difficulties   Cognition Arousal: Alert Behavior During Therapy: WFL for tasks assessed/performed Cognition: No apparent impairments             OT - Cognition Comments: eager for pending procedure later today because she is tired of npo status while waiting                 Following commands: Intact        Cueing   Cueing Techniques: Verbal cues, Tactile cues, Visual cues  Exercises      Shoulder Instructions  General Comments      Pertinent Vitals/ Pain       Pain Assessment Pain Assessment: No/denies pain  Home Living                                          Prior Functioning/Environment              Frequency  Min 2X/week        Progress Toward Goals  OT Goals(current goals can now be found in the care plan section)  Progress towards OT goals: Progressing toward goals     Plan      Co-evaluation                 AM-PAC OT 6 Clicks Daily Activity     Outcome Measure   Help from another person eating meals?: None Help from another person taking care of personal grooming?: A Little Help from another person toileting, which includes using toliet, bedpan, or urinal?: Total Help from another person bathing (including washing, rinsing, drying)?: A Lot Help from another person to put on and taking off regular upper  body clothing?: A Little Help from another person to put on and taking off regular lower body clothing?: Total 6 Click Score: 14    End of Session Equipment Utilized During Treatment: Oxygen  OT Visit Diagnosis: Unsteadiness on feet (R26.81);Muscle weakness (generalized) (M62.81);History of falling (Z91.81)   Activity Tolerance Patient tolerated treatment well   Patient Left in bed;with call bell/phone within reach   Nurse Communication          Time: 8867-8857 OT Time Calculation (min): 10 min  Charges: OT General Charges $OT Visit: 1 Visit OT Treatments $Self Care/Home Management : 8-22 mins  Randall, COTA/L Acute Rehabilitation [phone]   [name] Nest Lorraine-COTA/L  07/05/2024, 1:46 PM

## 2024-07-05 NOTE — Anesthesia Postprocedure Evaluation (Signed)
"   Anesthesia Post Note  Patient: Lauren Clark  Procedure(s) Performed: TRANSESOPHAGEAL ECHOCARDIOGRAM CARDIOVERSION     Patient location during evaluation: PACU Anesthesia Type: MAC Level of consciousness: awake and alert Pain management: pain level controlled Vital Signs Assessment: post-procedure vital signs reviewed and stable Respiratory status: spontaneous breathing, nonlabored ventilation, respiratory function stable and patient connected to nasal cannula oxygen Cardiovascular status: stable and blood pressure returned to baseline Postop Assessment: no apparent nausea or vomiting Anesthetic complications: no   There were no known notable events for this encounter.  Last Vitals:  Vitals:   07/05/24 1423 07/05/24 1500  BP: (!) 129/90 90/75  Pulse: 66 (!) 57  Resp: 14 20  Temp:    SpO2: 95% 100%    Last Pain:  Vitals:   07/05/24 1500  TempSrc:   PainSc: 0-No pain                 Lauren Clark      "

## 2024-07-05 NOTE — Progress Notes (Signed)
 "  Urology Inpatient Progress Report  Transaminitis [R74.01] Acute on chronic respiratory failure with hypoxemia (HCC) [J96.21] Acute hypoxic respiratory failure (HCC) [J96.01] Procedures: CYSTOSCOPY, WITH BLADDER CALCULUS LITHOLAPAXY 5 Days Post-Op  Intv/Subj: No acute events overnight. Plan for cardioversion this AM Complaining of difficulty breathing/feeling tired  Principal Problem:   Acute on chronic respiratory failure with hypoxemia (HCC) Active Problems:   AKI (acute kidney injury)   Pyelonephritis   COPD with acute exacerbation (HCC)   Elevated troponin   UTI (urinary tract infection)   Acute HFrEF (heart failure with reduced ejection fraction) (HCC)   HFrEF (heart failure with reduced ejection fraction) (HCC)   Pressure injury of skin   Influenza A   Severe sepsis with septic shock (HCC)   Takotsubo cardiomyopathy  Current Facility-Administered Medications  Medication Dose Route Frequency Provider Last Rate Last Admin   0.9 %  sodium chloride  infusion   Intravenous Continuous Croitoru, Mihai, MD 20 mL/hr at 07/05/24 0922 New Bag at 07/05/24 9077   albuterol  (PROVENTIL ) (2.5 MG/3ML) 0.083% nebulizer solution 2.5 mg  2.5 mg Nebulization Q4H PRN Alfornia Madison, MD   2.5 mg at 07/04/24 1531   amiodarone  (NEXTERONE  PREMIX) 360-4.14 MG/200ML-% (1.8 mg/mL) IV infusion  30 mg/hr Intravenous Continuous Otelia Gillian HERO, MD 16.67 mL/hr at 07/05/24 0925 30 mg/hr at 07/05/24 0925   amoxicillin -clavulanate (AUGMENTIN ) 875-125 MG per tablet 1 tablet  1 tablet Oral Q12H Krishnan, Gokul, MD   1 tablet at 07/05/24 9078   apixaban  (ELIQUIS ) tablet 5 mg  5 mg Oral BID Tariq, Hassan, MD   5 mg at 07/05/24 0919   brimonidine  (ALPHAGAN ) 0.2 % ophthalmic solution 1 drop  1 drop Right Eye BID Hammons, Suzen NOVAK, RPH   1 drop at 07/05/24 9076   And   timolol  (TIMOPTIC ) 0.5 % ophthalmic solution 1 drop  1 drop Right Eye BID Hammons, Kimberly B, RPH   1 drop at 07/04/24 2114   budesonide  (PULMICORT) nebulizer solution 0.5 mg  0.5 mg Nebulization BID Krishnan, Gokul, MD   0.5 mg at 07/05/24 9241   calcium  carbonate (TUMS - dosed in mg elemental calcium ) chewable tablet 200 mg of elemental calcium   1 tablet Oral TID PRN Tariq, Hassan, MD   200 mg of elemental calcium  at 07/02/24 0932   Chlorhexidine  Gluconate Cloth 2 % PADS 6 each  6 each Topical Daily Tariq, Hassan, MD   6 each at 07/05/24 9076   diclofenac  Sodium (VOLTAREN ) 1 % topical gel 2 g  2 g Topical TID AC & HS Tariq, Hassan, MD   2 g at 07/05/24 9076   FLUoxetine  (PROZAC ) capsule 40 mg  40 mg Oral Daily Tariq, Hassan, MD   40 mg at 07/05/24 9080   furosemide  (LASIX ) injection 40 mg  40 mg Intravenous Once Adams, Zane, PA-C       guaiFENesin  (MUCINEX ) 12 hr tablet 600 mg  600 mg Oral BID Tariq, Hassan, MD   600 mg at 07/05/24 0920   insulin aspart (novoLOG) injection 0-9 Units  0-9 Units Subcutaneous TID WC Krishnan, Gokul, MD   2 Units at 07/05/24 9082   ipratropium-albuterol  (DUONEB) 0.5-2.5 (3) MG/3ML nebulizer solution 3 mL  3 mL Nebulization Q6H Hochrein, James, MD   3 mL at 07/05/24 0758   latanoprost  (XALATAN ) 0.005 % ophthalmic solution 1 drop  1 drop Right Eye QHS Tariq, Hassan, MD   1 drop at 07/04/24 2116   levothyroxine  (SYNTHROID ) tablet 112 mcg  112 mcg Oral  q morning Tariq, Hassan, MD   112 mcg at 07/05/24 0503   lidocaine  (LIDODERM ) 5 % 1 patch  1 patch Transdermal Q24H Sundil, Subrina, MD   1 patch at 07/05/24 0500   methylPREDNISolone  sodium succinate (SOLU-MEDROL ) 40 mg/mL injection 40 mg  40 mg Intravenous Q12H Krishnan, Gokul, MD   40 mg at 07/05/24 0008   metoprolol  tartrate (LOPRESSOR ) tablet 25 mg  25 mg Oral TID Croitoru, Mihai, MD   25 mg at 07/05/24 0919   micafungin  (MYCAMINE ) 100 mg in sodium chloride  0.9 % 100 mL IVPB  100 mg Intravenous Q24H Vu, Trung T, MD 105 mL/hr at 07/05/24 0942 100 mg at 07/05/24 9057   oseltamivir  (TAMIFLU ) capsule 30 mg  30 mg Oral Daily Tariq, Hassan, MD   30 mg at  07/04/24 9092   oxyCODONE  (Oxy IR/ROXICODONE ) immediate release tablet 5 mg  5 mg Oral Q6H PRN Krishnan, Gokul, MD   5 mg at 07/05/24 0007   sodium bicarbonate  tablet 325 mg  325 mg Oral BID Tariq, Hassan, MD   325 mg at 07/05/24 0920   sodium chloride  flush (NS) 0.9 % injection 10-40 mL  10-40 mL Intracatheter Q12H Tariq, Hassan, MD   10 mL at 07/04/24 0911   sodium chloride  flush (NS) 0.9 % injection 10-40 mL  10-40 mL Intracatheter PRN Cosette Blackwater, MD       trimethobenzamide PHYLLISTINE) injection 200 mg  200 mg Intramuscular Q6H PRN Sundil, Subrina, MD   200 mg at 07/04/24 0120     Objective: Vital: Vitals:   07/05/24 0745 07/05/24 0835 07/05/24 0845 07/05/24 1000  BP: 121/70  114/74   Pulse: (!) 139 (!) 114 (!) 109 (!) 121  Resp: (!) 24 (!) 26 (!) 22 (!) 25  Temp:   97.8 F (36.6 C)   TempSrc: Oral  Oral   SpO2: 98% (!) 80% 96% 98%  Weight:      Height:       I/Os: I/O last 3 completed shifts: In: 1930.3 [P.O.:1730; I.V.:200.3] Out: 2600 [Urine:2600]  Physical Exam:  General: Patient is in no apparent distress, on oxygen, short of breath when talking Lungs: Normal respiratory effort, chest expands symmetrically. GI:  The abdomen is soft and nontender without mass. Ext: lower extremities symmetric  Lab Results: Recent Labs    07/03/24 0552 07/04/24 0641 07/05/24 0500  WBC 15.8* 13.1* 17.2*  HGB 10.9* 11.4* 10.9*  HCT 35.2* 37.3 34.7*   Recent Labs    07/03/24 0552 07/04/24 0948 07/05/24 0500  NA 141 138 142  K 4.2 5.1 4.9  CL 104 105 105  CO2 26 20* 29  GLUCOSE 212* 202* 181*  BUN 70* 62* 60*  CREATININE 1.91* 1.37* 1.30*  CALCIUM  9.0 9.5 9.3   No results for input(s): LABPT, INR in the last 72 hours. No results for input(s): LABURIN in the last 72 hours. Results for orders placed or performed during the hospital encounter of 06/26/24  Resp panel by RT-PCR (RSV, Flu A&B, Covid) Anterior Nasal Swab     Status: Abnormal   Collection Time: 06/27/24  12:05 AM   Specimen: Anterior Nasal Swab  Result Value Ref Range Status   SARS Coronavirus 2 by RT PCR NEGATIVE NEGATIVE Final   Influenza A by PCR POSITIVE (A) NEGATIVE Final   Influenza B by PCR NEGATIVE NEGATIVE Final    Comment: (NOTE) The Xpert Xpress SARS-CoV-2/FLU/RSV plus assay is intended as an aid in the diagnosis of influenza from Nasopharyngeal swab  specimens and should not be used as a sole basis for treatment. Nasal washings and aspirates are unacceptable for Xpert Xpress SARS-CoV-2/FLU/RSV testing.  Fact Sheet for Patients: bloggercourse.com  Fact Sheet for Healthcare Providers: seriousbroker.it  This test is not yet approved or cleared by the [place]  FDA and has been authorized for detection and/or diagnosis of SARS-CoV-2 by FDA under an Emergency Use Authorization (EUA). This EUA will remain in effect (meaning this test can be used) for the duration of the COVID-19 declaration under Section 564(b)(1) of the Act, 21 U.S.C. section 360bbb-3(b)(1), unless the authorization is terminated or revoked.     Resp Syncytial Virus by PCR NEGATIVE NEGATIVE Final    Comment: (NOTE) Fact Sheet for Patients: bloggercourse.com  Fact Sheet for Healthcare Providers: seriousbroker.it  This test is not yet approved or cleared by the [place]  FDA and has been authorized for detection and/or diagnosis of SARS-CoV-2 by FDA under an Emergency Use Authorization (EUA). This EUA will remain in effect (meaning this test can be used) for the duration of the COVID-19 declaration under Section 564(b)(1) of the Act, 21 U.S.C. section 360bbb-3(b)(1), unless the authorization is terminated or revoked.  Performed at [facility], [address] [place], [place] [address]   Urine Culture     Status: Abnormal   Collection Time: 06/27/24  6:20 AM   Specimen: Urine, Clean  Catch  Result Value Ref Range Status   Specimen Description URINE, CLEAN CATCH  Final   Special Requests   Final    NONE Performed at [facility], [address] [place], [place] [address]    Culture >=100,000 COLONIES/mL ENTEROCOCCUS FAECALIS (A)  Final   Report Status 07/01/2024 FINAL  Final   Organism ID, Bacteria ENTEROCOCCUS FAECALIS (A)  Final      Susceptibility   Enterococcus faecalis - MIC*    AMPICILLIN <=2 SENSITIVE Sensitive     NITROFURANTOIN <=16 SENSITIVE Sensitive     VANCOMYCIN  1 SENSITIVE Sensitive     * >=100,000 COLONIES/mL ENTEROCOCCUS FAECALIS  Culture, blood (Routine X 2) w Reflex to ID Panel     Status: None   Collection Time: 06/27/24  6:57 AM   Specimen: BLOOD LEFT HAND  Result Value Ref Range Status   Specimen Description BLOOD LEFT HAND  Final   Special Requests   Final    BOTTLES DRAWN AEROBIC ONLY Blood Culture results may not be optimal due to an inadequate volume of blood received in culture bottles   Culture   Final    NO GROWTH 5 DAYS Performed at [facility], [address], [place], [place] [address]    Report Status 07/02/2024 FINAL  Final  Culture, blood (Routine X 2) w Reflex to ID Panel     Status: None   Collection Time: 06/27/24  7:45 AM   Specimen: BLOOD  Result Value Ref Range Status   Specimen Description BLOOD RIGHT ANTECUBITAL  Final   Special Requests   Final    BOTTLES DRAWN AEROBIC AND ANAEROBIC Blood Culture adequate volume   Culture   Final    NO GROWTH 5 DAYS Performed at [facility], [address] [place], [place] [address]    Report Status 07/02/2024 FINAL  Final  MRSA Next Gen by PCR, Nasal     Status: None   Collection Time: 06/27/24 11:38 AM   Specimen: Nasal Mucosa; Nasal Swab  Result Value Ref Range Status   MRSA by  PCR Next Gen NOT DETECTED NOT DETECTED Final    Comment: (NOTE) The GeneXpert MRSA Assay (FDA approved for NASAL specimens only), is one component of a comprehensive MRSA  colonization surveillance program. It is not intended to diagnose MRSA infection nor to guide or monitor treatment for MRSA infections. Test performance is not FDA approved in patients less than 35 years old. Performed at [facility], [address] [place], [place] [address]   Fungus Culture With Stain     Status: None (Preliminary result)   Collection Time: 06/30/24 10:34 AM   Specimen: Path fluid; Body Fluid  Result Value Ref Range Status   Fungus Stain Final report  Final    Comment: (NOTE) Performed At: [facility] [address] [place], [place] [id number] Jennette Shorter MD Ey:[id number]    Fungus (Mycology) Culture PENDING  Incomplete   Fungal Source FLUID  Final    Comment: LEFT KIDNEY Performed at [facility], [address], [place], [place] [address]   Aerobic/Anaerobic Culture w Gram Stain (surgical/deep wound)     Status: None (Preliminary result)   Collection Time: 06/30/24 10:34 AM   Specimen: Path fluid; Body Fluid  Result Value Ref Range Status   Specimen Description FLUID LEFT KIDNEY  Final   Special Requests NONE  Final   Gram Stain RARE WBC SEEN NO ORGANISMS SEEN   Final   Culture   Final    NO GROWTH 4 DAYS NO ANAEROBES ISOLATED; CULTURE IN PROGRESS FOR 5 DAYS Performed at [facility], [address], [place], [place] [address]    Report Status PENDING  Incomplete  Acid Fast Smear (AFB)     Status: None   Collection Time: 06/30/24 10:34 AM   Specimen: Path fluid; Body Fluid  Result Value Ref Range Status   AFB Specimen Processing Concentration  Final   Acid Fast Smear Negative  Final    Comment: (NOTE) Performed At: [facility] [address] [place], [place] [id number] Jennette Shorter MD Ey:[id number]    Source (AFB) FLUID  Final    Comment: LEFT KIDNEY Performed at [facility], [address], [place], [place] [address]   Fungus Culture Result     Status: None   Collection Time: 06/30/24 10:34  AM  Result Value Ref Range Status   Result 1 Comment  Final    Comment: (NOTE) KOH/Calcofluor preparation:  no fungus observed. Performed At: [facility] [address] [place], [place] [id number] Jennette Shorter MD Ey:[id number]     Studies/Results: Reviewed her most recent CT scan performed on 07/03/2024 demonstrating persistent filling defect in the upper pole and a calcified mass in the lower pole of the left kidney.  The bladder is clearing  Assessment: Procedures: CYSTOSCOPY, WITH BLADDER CALCULUS LITHOLAPAXY, 5 Days Post-Op, persistent shortness of breath and deconditioned at this time.  Refractory A-fib to amiodarone .  Acute on chronic heart failure.  Significantly improved renal function. Fungal cultures have not returned, but do suspect that she has 2 large balls in her kidney, which is also what we found in her bladder and were able to remove initially.  Plan: I spoke to the patient about the management for this.  She will need a second look.  However, she is profoundly deconditioned at this point and ideally she would recover at least some from this.  We did discuss the timing of the second look, which would be at the earliest next Thursday.  We  could also delay it until she recovers fully, although I think it would be better to get her left kidney clear sooner than later.  I will look at my upcoming schedule as well as your schedule and try to find a time sometime within the next week.   Morene Salines, MD Urology 07/05/2024, 11:02 AM  "

## 2024-07-05 NOTE — Progress Notes (Signed)
" °  Echocardiogram Echocardiogram Transesophageal has been performed.  Lorrie Gargan 07/05/2024, 1:55 PM "

## 2024-07-05 NOTE — Inpatient Diabetes Management (Signed)
 Inpatient Diabetes Program Recommendations  AACE/ADA: New Consensus Statement on Inpatient Glycemic Control (2015)  Target Ranges:  Prepandial:   less than 140 mg/dL      Peak postprandial:   less than 180 mg/dL (1-2 hours)      Critically ill patients:  140 - 180 mg/dL   Lab Results  Component Value Date   GLUCAP 184 (H) 07/04/2024   HGBA1C 6.3 (H) 07/03/2024    Review of Glycemic Control  Latest Reference Range & Units 07/04/24 08:08 07/04/24 12:20 07/04/24 16:49 07/04/24 21:22  Glucose-Capillary 70 - 99 mg/dL 826 (H) 755 (H) 747 (H) 184 (H)   Diabetes history: Pre Diabetes Outpatient Diabetes medications: Jardiance 10 mg  Current orders for Inpatient glycemic control:  Novolog 0-9 units tid A1c 6.3% on 12/31 Solumedrol 40 mg Q12 hours  Inpatient Diabetes Program Recommendations:    Note: on steroids postprandials increase.  If steroids remain at current dose: -  consider Novolog 2 units tid meal coverage if eating >50% of meals  Thanks,  Clotilda Bull RN, MSN, BC-ADM Inpatient Diabetes Coordinator Team Pager [phone] (8a-5p)

## 2024-07-05 NOTE — Progress Notes (Signed)
 "  [facility] PROGRESS NOTE   Lauren Clark FMW:[id number] DOB: 1956/06/01 DOA: 06/26/2024  PCP: [name], MD  Brief History: 69 y.o. female with medical history significant of COPD, chronic hypoxemic respiratory failure on 2 L home oxygen, CAD, A-fib on Eliquis , HFpEF, ascending aortic aneurysm followed by cardiothoracic surgery, hypertension, hyperlipidemia, hypothyroidism, CKD stage IIIa, anemia, anxiety, depression, GERD, glaucoma, vision impairment, morbid obesity status post gastric sleeve surgery, PVD.  Recent hospital admission 12/13-12/24 for AKI, pyelonephritis, ileus, mildly elevated LFTs, and acute on chronic hypoxemic respiratory failure secondary to CHF exacerbation. Creatinine had peaked to 3.86.  Her diuretics were held and she was given IV albumin .  Creatinine improved to 2.97 at the time of discharge and nephrology resumed Lasix ..  Completed 7 days of Rocephin  for pyelonephritis.  She was evaluated by physical therapy and discharged with home health PT/OT afternoon.  Patient returned to the ED via EMS due to concern for shortness of breath, fall at home, and possible syncope. Admitted for acute on chronic hypoxic respiratory failure, Acute influenza PNA with superimposed bacterial PNA, acute COPD exacerbation, NSTEMI, acute CHF exacerbation, prolonged Qtc, Hypotension, acute liver failure, and acute on chronic kidney disease.   Consultants: Cardiology.  Infectious disease.  Urology.  Palliative care  Procedures:  Cystoscopy with cystolitholopaxy of 7 cm bladder collection/foreign body.  Ureteroscopy with laser destruction of lesion of upper and lower pole of left kidney with Dr. Cam on 12/28    Subjective/Interval History: Patient irritable this morning as she has been the last 2 days.  Wanting to know when she will be undergoing her procedure.  Continues to have some difficulty breathing.  No other complaints offered.    Assessment/Plan:  Acute on chronic  hypoxemic respiratory failure Acute influenza A infection Possible bacterial PNA COPD with acute exacerbation Patient was noted to be saturating in the 80s on her home 2 L [place] with EMS.   Patient was hospitalized and started on antibiotics and nebulizer treatments.  She completed 7-day course of ceftriaxone . Blood gas without evidence for hypercapnia. Dyspnea is likely multifactorial. Continue nebulizer treatments.  Steroids had to be reinitiated.  Stable today compared to 2 days ago. Remains on Tamiflu . She was given furosemide  during earlier part of this admission but had to be held due to AKI.  See below. Leukocytosis most likely multifactorial including infection and steroids. CT scan from 2 days ago did suggest worsening in her opacities especially in the lower lungs.  Small effusions were also noted. Will request speech therapy evaluation to make sure there is no aspiration component.   Acute on chronic HFpEF Hypotension CXR with no any acute findings but stable cardiomegaly.  Tachyarrhythmia could be causing some volume overload. Echocardiogram does show LVEF of 30 to 35%.  Grade 2 diastolic dysfunction noted. Patient was given furosemide  once in the emergency department and then again on 12/26 but not continued due to AKI. Cardiac status seems to be stable. Urology is following.   Acute complicated UTI Bladder/kidney lesions Sepsis, present on admission Recently finished 7-day course of Rocephin  for pyelonephritis. Prior UCx with polymicrobial growth at that time UA continues to show evidence of pyuria and bacteria. Ucx with ampicillin-sensitive E. Faecalis Blood culture without any growth. MRSA negative CXR with no any acute findings but stable cardiomegaly CT chest w/o contrast showed interval development of patchy ground-glass opacity with interlobular septal thickening in both upper lobes, L > R. Confluent areas of consolidative airspace disease are seen in  a patchy  distribution of the mid and lower lungs bilaterally. Imaging features are compatible with multifocal pneumonia. CT abdomen pelvis w/o contrast showed mild L hydroureteronephrosis without stone, high density material at upper pole calyx in L kidney that maybe blood product/infectious lesion/neoplasm, lamellated stone with internal gas in urinary bladder, cholelithiasis  Status post ureterocystoscopy w/ L retrograde pyelogram and Cystolitholapaxy 12/28 concerning for possible L renal and urinary bladder amorphous masses Pathology report of the left kidney wedge biopsy showed degenerated material with oxalate crystals and bacterial overgrowth.  Fungal elements were not identified.  Fungal ball did not show any fungal elements either. Patient has been on broad-spectrum antibiotics.  Subsequently seen by infectious disease.   Patient is currently noted to be on Augmentin .  Started back on micafungin . Mild hematuria noted.  Hemoglobin is stable though. CT scan was repeated on 12/31 which continues to show indeterminate soft tissue density filling defect within the upper pole collecting system of the left kidney.  This measures 2.5 x 1.7 x 1.7 cm.  Interval placement of the left-sided percutaneous nephro ureteral stent was noted with decompression of previous hydronephrosis.  A 1.1 cm left lower pole kidney stone was seen. Urology has been following.  Defer management to them.  Paroxysmal Atrial fibrillation Heart rate remains poorly controlled.  Patient has been on amiodarone  infusion.  Cardiology is following.  Patient is on Eliquis .  It appears the plan is for DC cardioversion today.     Elevated troponin NSTEMI History of CAD EKG without acute ischemic changes Minimally elevated troponin levels noted.  Cardiology is following.  No plans for catheterization.   Acute liver failure  Patient with significant elevation in AST ALT.  Possibly due to shock liver.   LFTs are trending downwards.   Hepatitis  panel was unremarkable.  Acetaminophen  level was low.  Statin is on hold. She will need outpatient evaluation by general surgery for consideration of cholecystectomy after her acute pulmonary and cardiac issues resolve.  Continue to trend LFTs every so often.   AKI on CKD stage IIIa High anion gap metabolic acidosis Baseline creatinine around 1.2.  Creatinine was 2.97 when she was discharged on 12/24.  Likely has higher baseline creatinine now. After getting IV diuretics creatinine did increase.  Now stabilizing again.  Avoid nephrotoxic agents.  Bicarbonate level has improved with sodium bicarbonate .   Strict ins and outs. Creatinine continues to improve.  Noted to be 1.91 today.   Chronic Intermittent Back pain Dengenerative disc disease Mechanical Fall No traumatic injuries identified on imaging of chest, pelvis, bilateral knees, left foot, head, and C-spine.  Fall precautions.  MRI L spine 12/29 showed Mild dextroscoliosis of the thoracolumbar spine, Grade 1 degenerative anterolisthesis at L3-4 and L4-5, Mild central spinal canal stenosis and moderate right neural foraminal stenosis at L4-5, Mild-to-moderate central spinal canal stenosis at L3-4, Mild central spinal canal stenosis at T12-L1. Cont lidocaine  patch PT/OT following   Lactic acidosis Likely in the setting of hypotension due to possible shock state (septic vs cardiogenic), NSTEMI and acute liver failure.  Resolved now.   QT prolongation QTc 531 on EKG.   Monitor electrolytes.  Avoid QT prolonging medications if possible.  Monitor on telemetry.   Hyperlipidemia Holding statin due to elevated LFTs.   Mild chronic hyponatremia Resolved   Hypothyroidism Continue levothyroxine    Anxiety and depression  Continue home fluoxetine    Obesity Estimated body mass index is 37.09 kg/m as calculated from the following:   Height as of  this encounter: 5' 5 (1.651 m).   Weight as of this encounter: 101.1 kg.   DVT  Prophylaxis: Apixaban  Code Status: DNR Family Communication: Discussed with patient Disposition Plan: SNF when medically stable     Medications: Scheduled:  amoxicillin -clavulanate  1 tablet Oral Q12H   apixaban   5 mg Oral BID   brimonidine   1 drop Right Eye BID   And   timolol   1 drop Right Eye BID   budesonide (PULMICORT) nebulizer solution  0.5 mg Nebulization BID   Chlorhexidine  Gluconate Cloth  6 each Topical Daily   diclofenac  Sodium  2 g Topical TID AC & HS   FLUoxetine   40 mg Oral Daily   furosemide   40 mg Intravenous Once   guaiFENesin   600 mg Oral BID   insulin aspart  0-9 Units Subcutaneous TID WC   ipratropium-albuterol   3 mL Nebulization Q6H   latanoprost   1 drop Right Eye QHS   levothyroxine   112 mcg Oral q morning   lidocaine   1 patch Transdermal Q24H   methylPREDNISolone  (SOLU-MEDROL ) injection  40 mg Intravenous Q12H   metoprolol  tartrate  25 mg Oral TID   oseltamivir   30 mg Oral Daily   sodium bicarbonate   325 mg Oral BID   sodium chloride  flush  10-40 mL Intracatheter Q12H   Continuous:  sodium chloride  20 mL/hr at 07/05/24 0922   amiodarone  30 mg/hr (07/05/24 0925)   micafungin  (MYCAMINE ) 100 mg in sodium chloride  0.9 % 100 mL IVPB 100 mg (07/05/24 0942)   PRN:albuterol , calcium  carbonate, oxyCODONE , sodium chloride  flush, trimethobenzamide  Antibiotics: Anti-infectives (From admission, onward)    Start     Dose/Rate Route Frequency Ordered Stop   07/04/24 1000  micafungin  (MYCAMINE ) 100 mg in sodium chloride  0.9 % 100 mL IVPB        100 mg 105 mL/hr over 1 Hours Intravenous Every 24 hours 07/03/24 1556     07/03/24 1015  amoxicillin -clavulanate (AUGMENTIN ) 875-125 MG per tablet 1 tablet        1 tablet Oral Every 12 hours 07/03/24 0922 07/07/24 0959   07/02/24 1200  amoxicillin -clavulanate (AUGMENTIN ) 500-125 MG per tablet 1 tablet  Status:  Discontinued        1 tablet Oral 2 times daily 07/02/24 1011 07/03/24 0922   07/02/24 1100   amoxicillin -clavulanate (AUGMENTIN ) 875-125 MG per tablet 1 tablet  Status:  Discontinued        1 tablet Oral Every 12 hours 07/02/24 1011 07/02/24 1012   07/01/24 1400  piperacillin -tazobactam (ZOSYN ) IVPB 3.375 g  Status:  Discontinued        3.375 g 12.5 mL/hr over 240 Minutes Intravenous Every 8 hours 07/01/24 1023 07/02/24 1011   06/30/24 1530  micafungin  (MYCAMINE ) 100 mg in sodium chloride  0.9 % 100 mL IVPB  Status:  Discontinued        100 mg 105 mL/hr over 1 Hours Intravenous Every 24 hours 06/30/24 1444 07/03/24 1144   06/29/24 1000  vancomycin  (VANCOCIN ) IVPB 1000 mg/200 mL premix  Status:  Discontinued        1,000 mg 200 mL/hr over 60 Minutes Intravenous Every 48 hours 06/27/24 0624 06/27/24 0924   06/28/24 2200  vancomycin  (VANCOCIN ) IVPB 1000 mg/200 mL premix        1,000 mg 200 mL/hr over 60 Minutes Intravenous  Once 06/28/24 1515 06/28/24 2235   06/28/24 1000  oseltamivir  (TAMIFLU ) capsule 30 mg        30 mg Oral Daily 06/28/24 0728  07/08/24 0959   06/27/24 1200  piperacillin -tazobactam (ZOSYN ) IVPB 3.375 g  Status:  Discontinued        3.375 g 12.5 mL/hr over 240 Minutes Intravenous Every 8 hours 06/27/24 0624 06/27/24 0924   06/27/24 1200  piperacillin -tazobactam (ZOSYN ) IVPB 2.25 g  Status:  Discontinued        2.25 g 100 mL/hr over 30 Minutes Intravenous Every 8 hours 06/27/24 0924 07/01/24 1023   06/27/24 0924  vancomycin  variable dose per unstable renal function (pharmacist dosing)  Status:  Discontinued         Does not apply See admin instructions 06/27/24 0924 06/29/24 0801   06/27/24 0500  oseltamivir  (TAMIFLU ) capsule 30 mg  Status:  Discontinued        30 mg Oral  Once 06/27/24 0425 06/28/24 0728   06/27/24 0045  piperacillin -tazobactam (ZOSYN ) IVPB 3.375 g        3.375 g 100 mL/hr over 30 Minutes Intravenous  Once 06/27/24 0036 06/27/24 0219   06/27/24 0045  vancomycin  (VANCOREADY) IVPB 1500 mg/300 mL        1,500 mg 150 mL/hr over 120 Minutes Intravenous   Once 06/27/24 0036 06/27/24 0401       Objective:  Vital Signs  Vitals:   07/05/24 0745 07/05/24 0835 07/05/24 0845 07/05/24 1000  BP: 121/70  114/74   Pulse: (!) 139 (!) 114 (!) 109 (!) 121  Resp: (!) 24 (!) 26 (!) 22 (!) 25  Temp:   97.8 F (36.6 C)   TempSrc: Oral  Oral   SpO2: 98% (!) 80% 96% 98%  Weight:      Height:        Intake/Output Summary (Last 24 hours) at 07/05/2024 1019 Last data filed at 07/05/2024 0859 Gross per 24 hour  Intake 850.3 ml  Output 2300 ml  Net -1449.7 ml   Filed Weights   07/03/24 0526 07/04/24 0309 07/05/24 0346  Weight: 102.9 kg 103.2 kg 101.1 kg    General appearance: Awake alert.  In no distress Resp: Mildly tachypneic without use of accessory muscles.  End expiratory wheezing heard bilaterally with few crackles in the bases.  No rhonchi. Cardio: S1-S2 is normal regular.  No S3-S4.  No rubs murmurs or bruit GI: Abdomen is soft.  Nontender nondistended.  Bowel sounds are present normal.  No masses organomegaly Extremities: [place] deconditioning noted though she is moving her extremities.  No focal neurological deficits.   Lab Results:  Data Reviewed: I have personally reviewed following labs and reports of the imaging studies  CBC: Recent Labs  Lab 06/29/24 0554 06/30/24 0555 06/30/24 1523 07/01/24 1431 07/02/24 0920 07/03/24 0552 07/04/24 0641 07/05/24 0500  WBC 25.2* 16.8* 39.0*  40.5* 26.0* 22.9* 15.8* 13.1* 17.2*  NEUTROABS 23.3* 14.9* 35.9*  --   --   --   --   --   HGB 11.0* 11.0* 10.2*  10.1* 10.2* 10.3* 10.9* 11.4* 10.9*  HCT 32.6* 34.2* 32.2*  31.5* 32.0* 32.9* 35.2* 37.3 34.7*  MCV 89.6 91.7 94.4  92.9 92.2 92.7 96.2 96.4 95.9  PLT 304 275 235  247 203 233 193 159 217    Basic Metabolic Panel: Recent Labs  Lab 06/30/24 1144 06/30/24 1523 07/01/24 0500 07/02/24 0920 07/03/24 0552 07/04/24 0948 07/05/24 0500  NA  --    < > 140 139 141 138 142  K  --    < > 4.1 3.3* 4.2 5.1 4.9  CL  --    < >  99  100 104 105 105  CO2  --    < > 25 26 26  20* 29  GLUCOSE  --    < > 147* 236* 212* 202* 181*  BUN  --    < > 83* 77* 70* 62* 60*  CREATININE  --    < > 2.90* 2.20* 1.91* 1.37* 1.30*  CALCIUM   --    < > 9.2 9.0 9.0 9.5 9.3  MG 2.0  --   --   --   --   --   --    < > = values in this interval not displayed.    GFR: Estimated Creatinine Clearance: 48.1 mL/min (A) (by C-G formula based on SCr of 1.3 mg/dL (H)).  Liver Function Tests: Recent Labs  Lab 07/01/24 0500 07/02/24 0920 07/03/24 0552 07/04/24 0948 07/05/24 0500  AST 410* 254* 182* 120* 98*  ALT 1,037* 746* 564* 409* 318*  ALKPHOS 196* 193* 194* 188* 185*  BILITOT 0.8 0.8 0.7 0.9 0.7  PROT 5.9* 5.9* 5.9* 6.1* 6.0*  ALBUMIN  2.9* 2.8* 2.8* 2.5* 2.9*     BNP (last 3 results) Recent Labs    06/27/24 0002 06/27/24 0705  PROBNP 8,463.0* 67,156.9*    Recent Results (from the past 240 hours)  Resp panel by RT-PCR (RSV, Flu A&B, Covid) Anterior Nasal Swab     Status: Abnormal   Collection Time: 06/27/24 12:05 AM   Specimen: Anterior Nasal Swab  Result Value Ref Range Status   SARS Coronavirus 2 by RT PCR NEGATIVE NEGATIVE Final   Influenza A by PCR POSITIVE (A) NEGATIVE Final   Influenza B by PCR NEGATIVE NEGATIVE Final    Comment: (NOTE) The Xpert Xpress SARS-CoV-2/FLU/RSV plus assay is intended as an aid in the diagnosis of influenza from Nasopharyngeal swab specimens and should not be used as a sole basis for treatment. Nasal washings and aspirates are unacceptable for Xpert Xpress SARS-CoV-2/FLU/RSV testing.  Fact Sheet for Patients: bloggercourse.com  Fact Sheet for Healthcare Providers: seriousbroker.it  This test is not yet approved or cleared by the [place]  FDA and has been authorized for detection and/or diagnosis of SARS-CoV-2 by FDA under an Emergency Use Authorization (EUA). This EUA will remain in effect (meaning this test can be used) for the  duration of the COVID-19 declaration under Section 564(b)(1) of the Act, 21 U.S.C. section 360bbb-3(b)(1), unless the authorization is terminated or revoked.     Resp Syncytial Virus by PCR NEGATIVE NEGATIVE Final    Comment: (NOTE) Fact Sheet for Patients: bloggercourse.com  Fact Sheet for Healthcare Providers: seriousbroker.it  This test is not yet approved or cleared by the [place]  FDA and has been authorized for detection and/or diagnosis of SARS-CoV-2 by FDA under an Emergency Use Authorization (EUA). This EUA will remain in effect (meaning this test can be used) for the duration of the COVID-19 declaration under Section 564(b)(1) of the Act, 21 U.S.C. section 360bbb-3(b)(1), unless the authorization is terminated or revoked.  Performed at [facility], [address] [place], [place] [address]   Urine Culture     Status: Abnormal   Collection Time: 06/27/24  6:20 AM   Specimen: Urine, Clean Catch  Result Value Ref Range Status   Specimen Description URINE, CLEAN CATCH  Final   Special Requests   Final    NONE Performed at [facility], [address] [place], [place] [address]    Culture >=100,000 COLONIES/mL ENTEROCOCCUS FAECALIS (A)  Final  Report Status 07/01/2024 FINAL  Final   Organism ID, Bacteria ENTEROCOCCUS FAECALIS (A)  Final      Susceptibility   Enterococcus faecalis - MIC*    AMPICILLIN <=2 SENSITIVE Sensitive     NITROFURANTOIN <=16 SENSITIVE Sensitive     VANCOMYCIN  1 SENSITIVE Sensitive     * >=100,000 COLONIES/mL ENTEROCOCCUS FAECALIS  Culture, blood (Routine X 2) w Reflex to ID Panel     Status: None   Collection Time: 06/27/24  6:57 AM   Specimen: BLOOD LEFT HAND  Result Value Ref Range Status   Specimen Description BLOOD LEFT HAND  Final   Special Requests   Final    BOTTLES DRAWN AEROBIC ONLY Blood Culture results may not be optimal due to an inadequate volume of blood  received in culture bottles   Culture   Final    NO GROWTH 5 DAYS Performed at [facility], [address] [place], [place] [address]    Report Status 07/02/2024 FINAL  Final  Culture, blood (Routine X 2) w Reflex to ID Panel     Status: None   Collection Time: 06/27/24  7:45 AM   Specimen: BLOOD  Result Value Ref Range Status   Specimen Description BLOOD RIGHT ANTECUBITAL  Final   Special Requests   Final    BOTTLES DRAWN AEROBIC AND ANAEROBIC Blood Culture adequate volume   Culture   Final    NO GROWTH 5 DAYS Performed at [facility], [address] [place], [place] [address]    Report Status 07/02/2024 FINAL  Final  MRSA Next Gen by PCR, Nasal     Status: None   Collection Time: 06/27/24 11:38 AM   Specimen: Nasal Mucosa; Nasal Swab  Result Value Ref Range Status   MRSA by PCR Next Gen NOT DETECTED NOT DETECTED Final    Comment: (NOTE) The GeneXpert MRSA Assay (FDA approved for NASAL specimens only), is one component of a comprehensive MRSA colonization surveillance program. It is not intended to diagnose MRSA infection nor to guide or monitor treatment for MRSA infections. Test performance is not FDA approved in patients less than 76 years old. Performed at [facility], [address], [place], [place] [address]   Fungus Culture With Stain     Status: None (Preliminary result)   Collection Time: 06/30/24 10:34 AM   Specimen: Path fluid; Body Fluid  Result Value Ref Range Status   Fungus Stain Final report  Final    Comment: (NOTE) Performed At: [facility] [address] [place], [place] [id number] Jennette Shorter MD Ey:[id number]    Fungus (Mycology) Culture PENDING  Incomplete   Fungal Source FLUID  Final    Comment: LEFT KIDNEY Performed at [facility], [address], [place], [place] [address]   Aerobic/Anaerobic Culture w Gram Stain (surgical/deep wound)     Status: None (Preliminary result)   Collection Time: 06/30/24  10:34 AM   Specimen: Path fluid; Body Fluid  Result Value Ref Range Status   Specimen Description FLUID LEFT KIDNEY  Final   Special Requests NONE  Final   Gram Stain RARE WBC SEEN NO ORGANISMS SEEN   Final   Culture   Final    NO GROWTH 4 DAYS NO ANAEROBES ISOLATED; CULTURE IN PROGRESS FOR 5 DAYS Performed at [facility], [address] [place], [place] [address]    Report Status PENDING  Incomplete  Acid Fast Smear (AFB)  Status: None   Collection Time: 06/30/24 10:34 AM   Specimen: Path fluid; Body Fluid  Result Value Ref Range Status   AFB Specimen Processing Concentration  Final   Acid Fast Smear Negative  Final    Comment: (NOTE) Performed At: [facility] [address] [place], [place] [id number] Jennette Shorter MD Ey:[id number]    Source (AFB) FLUID  Final    Comment: LEFT KIDNEY Performed at [facility], [address], [place], [place] [address]   Fungus Culture Result     Status: None   Collection Time: 06/30/24 10:34 AM  Result Value Ref Range Status   Result 1 Comment  Final    Comment: (NOTE) KOH/Calcofluor preparation:  no fungus observed. Performed At: [facility] [address] [place], [place] [id number] Jennette Shorter MD Ey:[id number]       Radiology Studies: No results found.      LOS: 8 days   Adri Schloss [employer] on [email or web address]  07/05/2024, 10:19 AM   "

## 2024-07-06 ENCOUNTER — Inpatient Hospital Stay (HOSPITAL_COMMUNITY)

## 2024-07-06 DIAGNOSIS — R7401 Elevation of levels of liver transaminase levels: Secondary | ICD-10-CM

## 2024-07-06 DIAGNOSIS — I4819 Other persistent atrial fibrillation: Secondary | ICD-10-CM

## 2024-07-06 DIAGNOSIS — I5043 Acute on chronic combined systolic (congestive) and diastolic (congestive) heart failure: Secondary | ICD-10-CM

## 2024-07-06 DIAGNOSIS — J690 Pneumonitis due to inhalation of food and vomit: Secondary | ICD-10-CM | POA: Diagnosis not present

## 2024-07-06 DIAGNOSIS — J9621 Acute and chronic respiratory failure with hypoxia: Secondary | ICD-10-CM | POA: Diagnosis not present

## 2024-07-06 LAB — GLUCOSE, CAPILLARY
Glucose-Capillary: 171 mg/dL — ABNORMAL HIGH (ref 70–99)
Glucose-Capillary: 155 mg/dL — ABNORMAL HIGH (ref 70–99)
Glucose-Capillary: 137 mg/dL — ABNORMAL HIGH (ref 70–99)
Glucose-Capillary: 191 mg/dL — ABNORMAL HIGH (ref 70–99)

## 2024-07-06 LAB — COMPREHENSIVE METABOLIC PANEL WITH GFR
ALT: 303 U/L — ABNORMAL HIGH (ref 0–44)
AST: 129 U/L — ABNORMAL HIGH (ref 15–41)
Albumin: 2.8 g/dL — ABNORMAL LOW (ref 3.5–5.0)
Alkaline Phosphatase: 227 U/L — ABNORMAL HIGH (ref 38–126)
Anion gap: 12 (ref 5–15)
BUN: 62 mg/dL — ABNORMAL HIGH (ref 8–23)
CO2: 28 mmol/L (ref 22–32)
Calcium: 9.1 mg/dL (ref 8.9–10.3)
Chloride: 98 mmol/L (ref 98–111)
Creatinine, Ser: 1.45 mg/dL — ABNORMAL HIGH (ref 0.44–1.00)
GFR, Estimated: 39 mL/min — ABNORMAL LOW
Glucose, Bld: 253 mg/dL — ABNORMAL HIGH (ref 70–99)
Potassium: 4.7 mmol/L (ref 3.5–5.1)
Sodium: 138 mmol/L (ref 135–145)
Total Bilirubin: 0.9 mg/dL (ref 0.0–1.2)
Total Protein: 6.1 g/dL — ABNORMAL LOW (ref 6.5–8.1)

## 2024-07-06 LAB — BLOOD GAS, ARTERIAL
Acid-Base Excess: 3 mmol/L — ABNORMAL HIGH (ref 0.0–2.0)
Bicarbonate: 28.5 mmol/L — ABNORMAL HIGH (ref 20.0–28.0)
Drawn by: 23604
O2 Saturation: 95.4 %
Patient temperature: 36.3
pCO2 arterial: 45 mmHg (ref 32–48)
pH, Arterial: 7.41 (ref 7.35–7.45)
pO2, Arterial: 64 mmHg — ABNORMAL LOW (ref 83–108)

## 2024-07-06 LAB — CBC
HCT: 38.1 % (ref 36.0–46.0)
Hemoglobin: 11.3 g/dL — ABNORMAL LOW (ref 12.0–15.0)
MCH: 29.4 pg (ref 26.0–34.0)
MCHC: 29.7 g/dL — ABNORMAL LOW (ref 30.0–36.0)
MCV: 99.2 fL (ref 80.0–100.0)
Platelets: 276 K/uL (ref 150–400)
RBC: 3.84 MIL/uL — ABNORMAL LOW (ref 3.87–5.11)
RDW: 18.5 % — ABNORMAL HIGH (ref 11.5–15.5)
WBC: 19.1 K/uL — ABNORMAL HIGH (ref 4.0–10.5)
nRBC: 0.8 % — ABNORMAL HIGH (ref 0.0–0.2)

## 2024-07-06 LAB — LACTIC ACID, PLASMA: Lactic Acid, Venous: 2.1 mmol/L (ref 0.5–1.9)

## 2024-07-06 MED ORDER — FUROSEMIDE 10 MG/ML IJ SOLN
20.0000 mg | INTRAMUSCULAR | Status: AC
  Administered 2024-07-06: 20 mg via INTRAVENOUS
  Filled 2024-07-06: qty 2

## 2024-07-06 MED ORDER — IPRATROPIUM-ALBUTEROL 0.5-2.5 (3) MG/3ML IN SOLN
3.0000 mL | Freq: Three times a day (TID) | RESPIRATORY_TRACT | Status: DC
  Administered 2024-07-07 – 2024-07-08 (×6): 3 mL via RESPIRATORY_TRACT
  Filled 2024-07-06 (×7): qty 3

## 2024-07-06 MED ORDER — HYDROMORPHONE HCL 1 MG/ML IJ SOLN
0.5000 mg | INTRAMUSCULAR | Status: DC | PRN
  Administered 2024-07-06 – 2024-07-08 (×7): 0.5 mg via INTRAVENOUS
  Filled 2024-07-06 (×8): qty 1

## 2024-07-06 MED ORDER — VANCOMYCIN HCL 2000 MG/400ML IV SOLN
2000.0000 mg | Freq: Once | INTRAVENOUS | Status: AC
  Administered 2024-07-06: 2000 mg via INTRAVENOUS
  Filled 2024-07-06: qty 400

## 2024-07-06 MED ORDER — ACETYLCYSTEINE 20 % IN SOLN
4.0000 mL | Freq: Three times a day (TID) | RESPIRATORY_TRACT | Status: DC
  Administered 2024-07-06 – 2024-07-08 (×8): 4 mL via RESPIRATORY_TRACT
  Filled 2024-07-06 (×12): qty 4

## 2024-07-06 MED ORDER — METOPROLOL SUCCINATE ER 50 MG PO TB24
50.0000 mg | ORAL_TABLET | Freq: Every day | ORAL | Status: DC
  Administered 2024-07-07: 50 mg via ORAL
  Filled 2024-07-06 (×2): qty 1

## 2024-07-06 MED ORDER — IPRATROPIUM-ALBUTEROL 0.5-2.5 (3) MG/3ML IN SOLN
3.0000 mL | Freq: Four times a day (QID) | RESPIRATORY_TRACT | Status: DC | PRN
  Administered 2024-07-06 – 2024-07-08 (×2): 3 mL via RESPIRATORY_TRACT
  Filled 2024-07-06: qty 3

## 2024-07-06 MED ORDER — FUROSEMIDE 10 MG/ML IJ SOLN
40.0000 mg | Freq: Two times a day (BID) | INTRAMUSCULAR | Status: DC
  Administered 2024-07-06 – 2024-07-07 (×3): 40 mg via INTRAVENOUS
  Filled 2024-07-06 (×4): qty 4

## 2024-07-06 MED ORDER — ORAL CARE MOUTH RINSE: 15.0000 mL | OROMUCOSAL | Status: DC | PRN

## 2024-07-06 MED ORDER — FUROSEMIDE 10 MG/ML IJ SOLN
20.0000 mg | Freq: Once | INTRAMUSCULAR | Status: AC
  Administered 2024-07-06: 20 mg via INTRAVENOUS
  Filled 2024-07-06: qty 2

## 2024-07-06 MED ORDER — SODIUM CHLORIDE 0.9 % IV SOLN
2.0000 g | Freq: Two times a day (BID) | INTRAVENOUS | Status: DC
  Administered 2024-07-06 – 2024-07-08 (×5): 2 g via INTRAVENOUS
  Filled 2024-07-06 (×5): qty 12.5

## 2024-07-06 MED ORDER — AMIODARONE HCL 200 MG PO TABS
200.0000 mg | ORAL_TABLET | Freq: Two times a day (BID) | ORAL | Status: DC
  Administered 2024-07-06 – 2024-07-08 (×5): 200 mg via ORAL
  Filled 2024-07-06 (×5): qty 1

## 2024-07-06 MED ORDER — ORAL CARE MOUTH RINSE
15.0000 mL | OROMUCOSAL | Status: DC
  Administered 2024-07-06 – 2024-07-08 (×8): 15 mL via OROMUCOSAL

## 2024-07-06 MED ORDER — VANCOMYCIN HCL 1250 MG/250ML IV SOLN
1250.0000 mg | INTRAVENOUS | Status: DC
  Administered 2024-07-07 – 2024-07-08 (×2): 1250 mg via INTRAVENOUS
  Filled 2024-07-06 (×2): qty 250

## 2024-07-06 NOTE — Significant Event (Signed)
 Rapid Response Event Note   Reason for Call :  Respiratory distress, hypoxia.  Per RN, pt dropped SpO2 to 80s on 2L [place]. Oxygen was weaned to NRB with SpO2 increasing to 90s. Pt's SpO2 again dropped to 80s and she was placed on bipap .50.  Initial Focused Assessment:  Pt lying in bed with eyes open. She nods when asked if her breathing is better with bipap on. Her breathing is tachypneic, lungs with exp wheezes and crackles. Skin cool to touch, dry.   HR-51, BP-105/58, RR-26, SpO2-94% on bipap .50.   Interventions:  Back on bipap PCXR ABG Duoneb 20mg  lasix  IV Plan of Care:  Pt breathing better on bipap. Give breathing tx and lasix  and monitor response. Await ABG and PCXR results. Please call RRT if further assistance needed.   Event Summary:   MD Notified: Dr. Lee notified by bedside RN Call [phone] Arrival [phone] End [id number]  Tish Graeme Piety, RN

## 2024-07-06 NOTE — Progress Notes (Signed)
 Pharmacy Antibiotic Note  Lauren Clark is a 69 y.o. female admitted on 06/26/2024 with pneumonia.  Pharmacy has been consulted for vancomycin  and cefepime  dosing.  Plan: Vancomycin  2g IV x 1, then 1250 mg q 24hrs. Cefepime  2g IV q 12 hrs. F/u cultures, renal function and clinical course.  Height: 5' 5 (165.1 cm) Weight: 101.1 kg (222 lb 14.2 oz) IBW/kg (Calculated) : 57  Temp (24hrs), Avg:97.7 F (36.5 C), Min:97.2 F (36.2 C), Max:98.1 F (36.7 C)  Recent Labs  Lab 06/30/24 1201 06/30/24 1411 06/30/24 1523 07/01/24 0500 07/01/24 1431 07/02/24 0920 07/03/24 0552 07/04/24 0641 07/04/24 0948 07/05/24 0500  WBC  --   --    < >  --  26.0* 22.9* 15.8* 13.1*  --  17.2*  CREATININE  --   --    < > 2.90*  --  2.20* 1.91*  --  1.37* 1.30*  LATICACIDVEN 2.2* 2.8*  --  1.2  --   --   --   --   --   --    < > = values in this interval not displayed.    Estimated Creatinine Clearance: 48.1 mL/min (A) (by C-G formula based on SCr of 1.3 mg/dL (H)).    Allergies[1]    Thank you for allowing pharmacy to be a part of this patients care.  Lauren Clark, Lauren Clark, Lauren Clark Clinical Pharmacist  07/06/2024 5:09 AM   [facility] pharmacy phone numbers are listed on [email or web address]      [1]  Allergies Allergen Reactions   Crestor  [Rosuvastatin  Calcium ] Other (See Comments)    Muscle aches     Hctz [Hydrochlorothiazide] Other (See Comments)    Pt doesn't remember   Lipitor [Atorvastatin] Other (See Comments)    Muscle aches

## 2024-07-06 NOTE — Progress Notes (Signed)
 Pt. Educated regarding need for bipap at start of shift. Pt.verbalizes understanding. Pt.demonstrates ability to remove bipap if there is a need due to risk of vomiting.   Suction/yankaur set up in room.

## 2024-07-06 NOTE — Progress Notes (Signed)
 Pt tolerating AFM welll at this time. Watching TV in NAD. Able to eat ice w/o cough or distress. BiPAP on standby. Pt encourageed to wear BiPAP again later this evening. Verbalizes agreement.

## 2024-07-06 NOTE — Plan of Care (Addendum)
 Respiratory distress-multifactorial Acute hypoxic respiratory failure COPD exacerbation Flu associated pneumonia Acute on chronic CHF exacerbation Patient found to be respiratory distress.  Physical exam revealed bilateral upper lower lung field rhonchi and crackles.  Patient is stating that she wants to die and does not want any BiPAP treatment.  Alert oriented x 3.  Initially placed on BiPAP however not tolerating it well due to discomfort.  Currently on heated high flow oxygen and aerosol facemask O2 sat 89 to 92%.  ABG showing low CO2 64.  Chest x-ray showing worsening bilateral patchy opacities and pulmonary nodule.   Patient  completed antibiotic course however chest x-ray showing worsening bilateral opacities high risk for hospital-acquired pneumonia VS development of ARDS.  -Obtaining CBC, CMP and lactic acid.  Checking blood culture and sputum culture. -Giving IV Lasix  40 mg total. - Continue DuoNeb every 6 hour and IV Solu-Medrol  40 mg twice daily. - Starting broad-spectrum antibiotic with IV vancomycin  and cefepime . -Continue DuoNeb breathing treatment, heated high flow oxygen with aerosol facemask and if requires need to to transition to BiPAP if patient tolerates. -Adding Mucomyst  3 times daily. -Patient is DNR/DNI.  Lauren Mierzwa, MD [facility] 07/06/2024, 4:48 AM

## 2024-07-06 NOTE — Progress Notes (Signed)
 1 Day Post-Op   Subjective/Chief Complaint:  1 - Left Hydronephrosis in Dominant Kidney - Rt renal atrophy / Lt hydro on imaging this admission. Left JJ stent placed by [facility] 12/28. CT 1/1 with stent in good position and decompressed bladder.  2 - Cystitis - enterococcus cystitis on UCX 12/25. Treated with maxipeme course.   3 - Possible Fungal Ball / Foreign Body - s/p evacuation of suspect fungal ball 12/28. Fungal CX NGTD, A1c 6.   Today Lauren Clark is stable from GU perspective. Cr 1.4. s/p cardioversion yesterday. No fevers.   Objective: Vital signs in last 24 hours: Temp:  [97.2 F (36.2 C)-98.1 F (36.7 C)] 97.3 F (36.3 C) (01/02 2307) Pulse Rate:  [47-139] 58 (01/03 0600) Resp:  [14-30] 28 (01/03 0600) BP: (89-129)/(54-90) 115/76 (01/03 0600) SpO2:  [80 %-100 %] 93 % (01/03 0600) FiO2 (%):  [50 %-100 %] 100 % (01/03 0443) Last BM Date : 07/05/24  Intake/Output from previous day: 01/02 0701 - 01/03 0700 In: 1314.4 [P.O.:430; I.V.:373.7; IV Piggyback:510.7] Out: 1100 [Urine:1100] Intake/Output this shift: Total I/O In: 628.3 [P.O.:150; I.V.:171; IV Piggyback:307.3] Out: 850 [Urine:850]   Mildly labored breathing on face mask Morbidly obese / frail Foley in place with thin, non-foul, very light pink urine Bilateral LE edema  Lab Results:  Recent Labs    07/05/24 0500 07/06/24 0437  WBC 17.2* 19.1*  HGB 10.9* 11.3*  HCT 34.7* 38.1  PLT 217 276   BMET Recent Labs    07/05/24 0500 07/06/24 0437  NA 142 138  K 4.9 4.7  CL 105 98  CO2 29 28  GLUCOSE 181* 253*  BUN 60* 62*  CREATININE 1.30* 1.45*  CALCIUM  9.3 9.1   PT/INR No results for input(s): LABPROT, INR in the last 72 hours. ABG Recent Labs    07/06/24 0410  PHART 7.41  HCO3 28.5*    Studies/Results: DG CHEST PORT 1 VIEW Result Date: 07/06/2024 EXAM: 1 VIEW(S) XRAY OF THE CHEST 07/06/2024 04:03:53 AM COMPARISON: 07/02/2024 CLINICAL HISTORY: SOB (shortness of breath) FINDINGS: LINES,  TUBES AND DEVICES: Right upper extremity PICC in place with tip at superior cavoatrial junction. LUNGS AND PLEURA: Worsening bilateral patchy airspace opacities. Bilateral pulmonary nodules. No pleural effusion. No pneumothorax. HEART AND MEDIASTINUM: Cardiomegaly. BONES AND SOFT TISSUES: No acute osseous abnormality. IMPRESSION: 1. Worsening bilateral patchy airspace opacities and bilateral pulmonary nodules. 2. Right upper extremity PICC with tip at the superior cavoatrial junction. 3. Cardiomegaly. Electronically signed by: Evalene Coho MD 07/06/2024 04:20 AM EST RP Workstation: [id number]   ECHO TEE Result Date: 07/05/2024    TRANSESOPHOGEAL ECHO REPORT   Patient Name:   Lauren Clark Date of Exam: 07/05/2024 Medical Rec #:  [id number]    Height:       65.0 in Accession #:    [id number]   Weight:       222.9 lb Date of Birth:  09-06-1955    BSA:          2.071 m Patient Age:    68 years     BP:           115/71 mmHg Patient Gender: F            HR:           86 bpm. Exam Location:  Inpatient Procedure: Transesophageal Echo, Color Doppler and Cardiac Doppler (Both            Spectral and Color Flow Doppler were utilized during procedure). Indications:  atrial fibrillation  History:         Patient has prior history of Echocardiogram examinations, most                  recent 06/27/2024. Takotsubo, aortic aneurysm and CAD, chronic                  kidney disease, Arrythmias:Atrial Fibrillation; Risk                  Factors:Hypertension and Dyslipidemia.  Sonographer:     Tinnie Barefoot RDCS Referring Phys:  [phone] MIHAI CROITORU Diagnosing Phys: Jerel Balding MD PROCEDURE: After discussion of the risks and benefits of a TEE, an informed consent was obtained from the patient. The transesophogeal probe was passed without difficulty through the esophogus of the patient. Imaged were obtained with the patient in a supine position. Sedation performed by different physician. The patient developed no complications  during the procedure. A successful direct current cardioversion was performed at 200 joules with 1 attempt.  IMPRESSIONS  1. Left ventricular ejection fraction, by estimation, is 50%. The left ventricle has mildly decreased function. The left ventricle demonstrates global hypokinesis.  2. Right ventricular systolic function is mildly reduced. The right ventricular size is normal.  3. No left atrial/left atrial appendage thrombus was detected. The LAA emptying velocity was 30 cm/s.  4. The mitral valve is degenerative. Mild mitral valve regurgitation.  5. The aortic valve is tricuspid. There is mild calcification of the aortic valve. There is mild thickening of the aortic valve. Aortic valve regurgitation is moderate. Aortic valve sclerosis/calcification is present, without any evidence of aortic stenosis. Aortic regurgitation PHT measures 350 msec.  6. 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 There isno visible residual aaortic dissection. Aortic dilatation noted. 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 Comparison(s): Left ventricular function has improved and the anteroapical regional wall motion abnormality has resolved. FINDINGS  Left Ventricle: Left ventricular ejection fraction, by estimation, is 50%. The left ventricle has mildly decreased function. The left ventricle demonstrates global hypokinesis. The left ventricular internal cavity size was normal in size. Right Ventricle: The right ventricular size is normal. No increase in right ventricular wall thickness. Right ventricular systolic function is mildly reduced. Left Atrium: Left atrial size was normal in size. No left atrial/left atrial appendage thrombus was detected. The LAA emptying velocity was 30 cm/s. Right Atrium: Right atrial size was normal in size. Prominent Eustachian valve. Pericardium: There is no evidence of  pericardial effusion. Mitral Valve: The mitral valve is degenerative in appearance. Mild mitral valve regurgitation. Tricuspid Valve: The tricuspid valve is not well visualized. Tricuspid valve regurgitation is trivial. Aortic Valve: The aortic valve is tricuspid. There is mild calcification of the aortic valve. There is mild thickening of the aortic valve. Aortic valve regurgitation is moderate. Aortic regurgitation PHT measures 350 msec. Aortic valve sclerosis/calcification is present, without any evidence of aortic stenosis. Pulmonic Valve: The pulmonic valve was grossly normal. Pulmonic valve regurgitation is trivial. 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 There isno visible residual aaortic dissection. Aortic dilatation noted. There is severe dilatation  of the aortic root, measuring 51 mm. IAS/Shunts: No atrial level shunt detected by color flow Doppler.  AORTIC VALVE AI PHT:  350 msec  AORTA Ao Root diam: 5.50 cm Ao Asc diam:  4.10 cm Jerel Balding MD Electronically signed by Jerel Balding MD Signature Date/Time: 07/05/2024/2:55:45 PM    Final    EP STUDY Result Date: 07/05/2024 See surgical note for result.   Anti-infectives: Anti-infectives (From admission, onward)    Start     Dose/Rate Route Frequency Ordered Stop   07/07/24 0800  vancomycin  (VANCOREADY) IVPB 1250 mg/250 mL        1,250 mg 166.7 mL/hr over 90 Minutes Intravenous Every 24 hours 07/06/24 0507     07/06/24 0600  vancomycin  (VANCOREADY) IVPB 2000 mg/400 mL        2,000 mg 200 mL/hr over 120 Minutes Intravenous  Once 07/06/24 0504     07/06/24 0600  ceFEPIme  (MAXIPIME ) 2 g in sodium chloride  0.9 % 100 mL IVPB        2 g 200 mL/hr over 30 Minutes Intravenous Every 12 hours 07/06/24 0504     07/05/24 2200  oseltamivir  (TAMIFLU ) capsule 30 mg        30 mg Oral 2 times daily 07/05/24 1340 07/07/24 2159    07/04/24 1000  micafungin  (MYCAMINE ) 100 mg in sodium chloride  0.9 % 100 mL IVPB        100 mg 105 mL/hr over 1 Hours Intravenous Every 24 hours 07/03/24 1556     07/03/24 1015  amoxicillin -clavulanate (AUGMENTIN ) 875-125 MG per tablet 1 tablet        1 tablet Oral Every 12 hours 07/03/24 0922 07/07/24 0959   07/02/24 1200  amoxicillin -clavulanate (AUGMENTIN ) 500-125 MG per tablet 1 tablet  Status:  Discontinued        1 tablet Oral 2 times daily 07/02/24 1011 07/03/24 0922   07/02/24 1100  amoxicillin -clavulanate (AUGMENTIN ) 875-125 MG per tablet 1 tablet  Status:  Discontinued        1 tablet Oral Every 12 hours 07/02/24 1011 07/02/24 1012   07/01/24 1400  piperacillin -tazobactam (ZOSYN ) IVPB 3.375 g  Status:  Discontinued        3.375 g 12.5 mL/hr over 240 Minutes Intravenous Every 8 hours 07/01/24 1023 07/02/24 1011   06/30/24 1530  micafungin  (MYCAMINE ) 100 mg in sodium chloride  0.9 % 100 mL IVPB  Status:  Discontinued        100 mg 105 mL/hr over 1 Hours Intravenous Every 24 hours 06/30/24 1444 07/03/24 1144   06/29/24 1000  vancomycin  (VANCOCIN ) IVPB 1000 mg/200 mL premix  Status:  Discontinued        1,000 mg 200 mL/hr over 60 Minutes Intravenous Every 48 hours 06/27/24 0624 06/27/24 0924   06/28/24 2200  vancomycin  (VANCOCIN ) IVPB 1000 mg/200 mL premix        1,000 mg 200 mL/hr over 60 Minutes Intravenous  Once 06/28/24 1515 06/28/24 2235   06/28/24 1000  oseltamivir  (TAMIFLU ) capsule 30 mg  Status:  Discontinued        30 mg Oral Daily 06/28/24 0728 07/05/24 1340   06/27/24 1200  piperacillin -tazobactam (ZOSYN ) IVPB 3.375 g  Status:  Discontinued        3.375 g 12.5 mL/hr over 240 Minutes Intravenous Every 8 hours 06/27/24 0624 06/27/24 0924   06/27/24 1200  piperacillin -tazobactam (ZOSYN ) IVPB 2.25 g  Status:  Discontinued        2.25 g 100 mL/hr over 30 Minutes Intravenous Every 8 hours 06/27/24 0924 07/01/24 1023   06/27/24 0924  vancomycin  variable dose per unstable renal  function (pharmacist dosing)  Status:  Discontinued         Does not apply See admin instructions 06/27/24 0924 06/29/24 0801   06/27/24 0500  oseltamivir  (TAMIFLU ) capsule 30 mg  Status:  Discontinued        30 mg Oral  Once 06/27/24 0425 06/28/24 0728   06/27/24 0045  piperacillin -tazobactam (ZOSYN ) IVPB 3.375 g        3.375 g 100 mL/hr over 30 Minutes Intravenous  Once 06/27/24 0036 06/27/24 0219   06/27/24 0045  vancomycin  (VANCOREADY) IVPB 1500 mg/300 mL        1,500 mg 150 mL/hr over 120 Minutes Intravenous  Once 06/27/24 0036 06/27/24 0401       Assessment/Plan:  Kidney and bladder decompressed. No indication for additional procedural intervention this admission. I feel OK to DC foely when not needed from UOP perspective. WIll follow PRN over rest of weekend.     Ricardo KATHEE Alvaro Mickey. 07/06/2024

## 2024-07-06 NOTE — Evaluation (Addendum)
 Clinical/Bedside Swallow Evaluation Patient Details  Name: Lauren Clark MRN: [id number] Date of Birth: 1955-11-23  Today's Date: 07/06/2024 Time: SLP Start Time (ACUTE ONLY): 1512 SLP Stop Time (ACUTE ONLY): 1530 SLP Time Calculation (min) (ACUTE ONLY): 18 min  Past Medical History:  Past Medical History:  Diagnosis Date   Anemia    s/p knee surgery   Anxiety    Aortic dissection (HCC)    Arthritis    COPD (chronic obstructive pulmonary disease) (HCC) 06/15/2024   Depression    GERD (gastroesophageal reflux disease)    Glaucoma    right eye uses drops   [facility] blood transfusion    [facility]   History of MRSA infection    Hyperlipidemia    Hypertension    Hypothyroidism    Impaired vision in both eyes    blind left eye, limited vision right eye   Morbid obesity (HCC)    s/p Gastric sleeve- 8'17(loss thus far approx. 100 lbs)   Peripheral vascular disease    dvt right leg   Pre-diabetes    prior to gastric sleeve   Past Surgical History:  Past Surgical History:  Procedure Laterality Date   ABDOMINAL HYSTERECTOMY  1981   with bilateral BSO   BREAST BIOPSY Right 10/18/2023   MM RT BREAST BX W LOC DEV 1ST LESION IMAGE BX SPEC STEREO GUIDE 10/18/2023 [facility]   CATARACT EXTRACTION Right 2010   [facility]   COLONOSCOPY     CYSTOSCOPY WITH LITHOLAPAXY N/A 06/30/2024   Procedure: CYSTOSCOPY, WITH BLADDER CALCULUS LITHOLAPAXY;  Surgeon: Cam Morene ORN, MD;  Location[facility];  Service: Urology;  Laterality: N/A;  ,LEFT URETEROSCOPY/ LASER LITHOTRIPSY CYSTOLITHALOPAXY N 5 CM, LEFT RETROGRADE PYELOGRAM AND LEFT URETERAL STENT PLACEMENT   DEBRIDEMENT AND CLOSURE WOUND Right 07/07/2014   Dr. Renato   EYE SURGERY     bil cataraction with IOL as a child, cataract surgery late 1990- right   JOINT REPLACEMENT Right 2016   knee   KNEE DEBRIDEMENT Right 06/22/2014   Dr. Reyes Boer with wound vac   KNEE SURGERY     i&d  KNEE POST REPLACEMENT   LAPAROSCOPIC GASTRIC SLEEVE RESECTION N/A 02/29/2016   Procedure: LAPAROSCOPIC GASTRIC SLEEVE RESECTION, UPPER ENDO;  Surgeon: Camellia Blush, MD;  Location: [facility] ORS;  Service: General;  Laterality: N/A;   TONSILLECTOMY     TOTAL HIP ARTHROPLASTY Right 06/14/2016   Procedure: RIGHT TOTAL HIP ARTHROPLASTY ANTERIOR APPROACH;  Surgeon: Donnice Car, MD;  Location: [facility] ORS;  Service: Orthopedics;  Laterality: Right;   TOTAL KNEE ARTHROPLASTY Left 09/05/2017   Procedure: LEFT TOTAL KNEE ARTHROPLASTY;  Surgeon: Car Donnice, MD;  Location: [facility] ORS;  Service: Orthopedics;  Laterality: Left;  Adductor Block   UPPER GI ENDOSCOPY  01/25/2016   HPI:  Pt is a 69 year old female admitted with acute influenza pna. MD concerned for possible bacterial pna as well as COPD exacerbation. Recent hospitalization for AKI, chronic respiratory failure due to CHF. Pt also has UTI, chronic heart failure and chronic back pain. No history of dysphagia in chart.    Assessment / Plan / Recommendation  Clinical Impression  Pt demonstrates symptoms most consistent with esophageal dysphagia. Pt has regurgitation after a container of applesauce and a few sips of water . Pt says wow thats never happened before. However she does report that it happens when she drinks large amounts of fluid, or if she eats anything thats not a pudding  or pureed texture she has severe globus and coughing. Also reports she has to be upright, which she was for assessment. Pt also struggling with adequate oxygenation; during PO intake vent mask was briefly removed for each bite or sip and replaced, but this resulted in desat to 77. Pt will need to have a nasal cannula for any meaningful PO intake and would need better respiratory endurance to attempt esophagram. For now recommend ongoing ice chips, meds in puree. If pt wants to attempt sips or bites, recommend puree only. SLP will f/u for next steps when breathing is more stable. SLP Visit  Diagnosis: Dysphagia, unspecified (R13.10)    Aspiration Risk  Moderate aspiration risk    Diet Recommendation           Other Recommendations Oral Care Recommendations: Oral care QID     Swallow Evaluation Recommendations Diet puree/thin with very small bites with supervision   Assistance Recommended at Discharge    Functional Status Assessment Patient has had a recent decline in their functional status and demonstrates the ability to make significant improvements in function in a reasonable and predictable amount of time.  Frequency and Duration min 2x/week  2 weeks       Prognosis Prognosis for improved oropharyngeal function: Guarded Barriers to Reach Goals: Severity of deficits      Swallow Study   General HPI: Pt is a 69 year old female admitted with acute influenza pna. MD concerned for possible bacterial pna as well as COPD exacerbation. Recent hospitalization for AKI, chronic respiratory failure due to CHF. Pt also has UTI, chronic heart failure and chronic back pain. No history of dysphagia in chart. Type of Study: Bedside Swallow Evaluation Previous Swallow Assessment: none Diet Prior to this Study: Dysphagia 3 (mechanical soft);Thin liquids (Level 0) Temperature Spikes Noted: No Respiratory Status: Venti-mask History of Recent Intubation: No Behavior/Cognition: Alert;Cooperative;Pleasant mood Oral Cavity Assessment: Within Functional Limits Oral Care Completed by SLP: No Oral Cavity - Dentition: Edentulous Vision: Impaired for self-feeding Self-Feeding Abilities: Needs set up Patient Positioning: Upright in bed Baseline Vocal Quality: Normal Volitional Cough: Strong Volitional Swallow: Able to elicit    Oral/Motor/Sensory Function Overall Oral Motor/Sensory Function: Within functional limits   Ice Chips Ice chips: Within functional limits Presentation: Spoon   Thin Liquid Thin Liquid: Impaired Pharyngeal  Phase Impairments: Cough - Delayed    Nectar Thick  Nectar Thick Liquid: Not tested   Honey Thick Honey Thick Liquid: Not tested   Puree Puree: Impaired Pharyngeal Phase Impairments: Cough - Delayed (regurgitate)   Solid     Solid: Not tested      Agueda Houpt, Consuelo Fitch 07/06/2024,3:47 PM

## 2024-07-06 NOTE — Progress Notes (Signed)
 "                                                                                                                                                                                               Daily Progress Note   Patient Name: Lauren Clark       Date: 07/06/2024 DOB: 1955/11/14  Age: 69 y.o. MRN#: [id number] Attending Physician: Verdene Purchase, MD Primary Care Physician: [name], MD Admit Date: 06/26/2024  Reason for Consultation/Follow-up: Establishing goals of care  Subjective: Not feeling well, worsening respiratory status, no pain, dry mouth  Length of Stay: 9  Current Medications: Scheduled Meds:   acetylcysteine   4 mL Nebulization TID   amiodarone   200 mg Oral BID   amoxicillin -clavulanate  1 tablet Oral Q12H   apixaban   5 mg Oral BID   brimonidine   1 drop Right Eye BID   And   timolol   1 drop Right Eye BID   budesonide  (PULMICORT ) nebulizer solution  0.5 mg Nebulization BID   Chlorhexidine  Gluconate Cloth  6 each Topical Daily   diclofenac  Sodium  2 g Topical TID AC & HS   FLUoxetine   40 mg Oral Daily   furosemide   40 mg Intravenous BID   guaiFENesin   600 mg Oral BID   insulin  aspart  0-9 Units Subcutaneous TID WC   ipratropium-albuterol   3 mL Nebulization Q6H   latanoprost   1 drop Right Eye QHS   levothyroxine   112 mcg Oral q morning   lidocaine   1 patch Transdermal Q24H   methylPREDNISolone  (SOLU-MEDROL ) injection  40 mg Intravenous Q12H   metoprolol  succinate  50 mg Oral Daily   oseltamivir   30 mg Oral BID   sodium bicarbonate   325 mg Oral BID   sodium chloride  flush  10-40 mL Intracatheter Q12H    Continuous Infusions:  ceFEPime  (MAXIPIME ) IV Stopped (07/06/24 0617)   micafungin  (MYCAMINE ) 100 mg in sodium chloride  0.9 % 100 mL IVPB Stopped (07/05/24 1039)   [START ON 07/07/2024] vancomycin       PRN Meds: calcium  carbonate, HYDROmorphone  (DILAUDID ) injection, ipratropium-albuterol , oxyCODONE , sodium chloride  flush, trimethobenzamide   Physical  Exam Constitutional:      General: She is not in acute distress.    Appearance: She is ill-appearing.  Pulmonary:     Comments: On bipap Skin:    General: Skin is warm and dry.  Neurological:     Mental Status: She is alert and oriented to person, place, and time.             Vital Signs: BP 132/61   Pulse (!) 52  Temp 97.6 F (36.4 C) (Axillary)   Resp (!) 24   Ht 5' 5 (1.651 m)   Wt 102.8 kg   SpO2 94%   BMI 37.71 kg/m  SpO2: SpO2: 94 % O2 Device: O2 Device: Bi-PAP O2 Flow Rate: O2 Flow Rate (L/min): 12 L/min  Intake/output summary:  Intake/Output Summary (Last 24 hours) at 07/06/2024 0954 Last data filed at 07/06/2024 9353 Gross per 24 hour  Intake 1314.4 ml  Output 850 ml  Net 464.4 ml   LBM: Last BM Date : 07/05/24 Baseline Weight: Weight: 91.6 kg Most recent weight: Weight: 102.8 kg       Palliative Assessment/Data: PPS 40%      Patient Active Problem List   Diagnosis Date Noted   Persistent atrial fibrillation (HCC) 07/06/2024   Transaminitis 07/06/2024   Influenza A 06/30/2024   Severe sepsis with septic shock (HCC) 06/30/2024   Takotsubo cardiomyopathy 06/30/2024   HFrEF (heart failure with reduced ejection fraction) (HCC) 06/29/2024   Pressure injury of skin 06/29/2024   Acute on chronic respiratory failure with hypoxemia (HCC) 06/27/2024   Elevated troponin 06/27/2024   UTI (urinary tract infection) 06/27/2024   Acute HFrEF (heart failure with reduced ejection fraction) (HCC) 06/27/2024   Acute renal failure superimposed on stage 3a chronic kidney disease (HCC) 06/15/2024   Pyelonephritis 06/15/2024   COPD with acute exacerbation (HCC) 06/15/2024   AKI (acute kidney injury) 01/13/2023   Hypothyroidism 01/12/2023   Acute on chronic diastolic CHF (congestive heart failure) (HCC) 01/11/2023   Chronic heart failure with preserved ejection fraction (HFpEF) (HCC) 01/11/2023   Atrial fibrillation, chronic (HCC) 11/18/2022   Intramural aortic  hematoma (HCC) 11/18/2022   Aortic dissection (HCC) 11/12/2022   Positive hepatitis C antibody test 09/02/2019   S/P left TKA 09/05/2017   S/P total knee replacement 09/05/2017   S/P right THA, AA 06/14/2016   Prediabetes 02/29/2016   Dyslipidemia 02/29/2016   Degenerative joint disease of right hip 02/29/2016   S/P laparoscopic sleeve gastrectomy 02/29/2016   Preoperative cardiovascular examination 02/19/2016   Hyperlipidemia 02/19/2016   Class 3 obesity (HCC) 02/19/2016   Essential hypertension 02/19/2016    Palliative Care Assessment & Plan   HPI: 69 y.o. female   admitted on 06/26/2024 with past medical history significant for morbid obesity status post gastric sleeve, history of DVT, A-fib, CHF, hypertension, thoracic aortic aneurysm, hypothyroidism, nonobstructive CAD,   vision impairment admitted for evaluation of elevated troponin.    Recent hospitalization 06/15/2024 to 06/16/2024 for AKI, pyelonephritis, mildly elevated LFTs, and acute on chronic hypoxic respiratory failure   Admitted for treatment and stabilization.  Patient with elevated LFTs, elevated troponin, COPD exacerbation in the setting of influenza A.   Improving with full medical support.  Ongoing discussion regarding next steps in transition of care.     Patient faces treatment option decisions, advanced directive decisions and anticipatory care needs.  Assessment: Received message from Dr Verdene that patient had decompensated, requesting palliative care involvement. Went to bedside - received update from RN - patient on bipap. Patient has had significant decline in mental status overnight. Wakes to gentle touch and willing to talk. Placed on face mask, removed bipap to allow for discussion. We review eventful night with decline in status - she tells me she feels awful and does not want to continue all of this.   We review potential avenues moving forward - we discuss that we could transition to comfort  focused care - I explained comfort care as  care where the patient would no longer receive aggressive medical interventions such as continuous vital signs, lab work, radiology testing, or medications not focused on comfort, peace, and dignity. This includes stopping antibiotics and weaning oxygen to room air, as these are generally not accepted as providing comfort but only prolonging the dying process artificially. All care would focus on how the patient is looking and feeling. This would include management of any symptoms that may cause discomfort, pain, shortness of breath/air hunger, increased work of breathing, cough, nausea, agitation/restlessness, anxiety, and/or secretions etc. Symptoms would be managed with medications and other non-pharmacological interventions such as spiritual support if requested, repositioning, music therapy, or therapeutic listening.   We also reviewed that we could continue current measures for another 24 hours and see how she does - with any decline, she could transition to comfort care.   She opts to continue current measures for now but with low threshold to transition to comfort care. We discuss that PMT will continue to follow up.   She tells me she is most worried about her cat Ellouise Bihari. She tells me her friend Olam and her neighbor are caring for her cat. She tells me a friend's daughter plans to assume care of Ellouise Bihari once Ms. Nagele passes. She is comforted that she has a plan for her.   Call to Olam who is her friend and Ms. Lober has stated she would want Olam to make medical decisions for her if she were unable. Reviewed with Olam current situation and worsening condition. She understands. Planning to come visit later today.   Recommendations/Plan: Reviewed option of transition to comfort care - low threshold to transition - wants to continue current measures for another 24 hours but with worsening or failure to improve would likely opt for comfort  measures Her friend Olam is who Ms. Rzasa would want to make medical decisions for her if she were unable - she has been updated on situation and is aware of patient's wishes Added prn IV dilaudid  for respiratory distress  Code Status: DNR  Care plan was discussed with RN, Dr. Verdene, friend Olam  Thank you for allowing the Palliative Medicine Team to assist in the care of this patient.   Total Time 60 minutes Prolonged Time Billed  no   Time spent includes: Detailed review of medical records (labs, imaging, vital signs), medically appropriate exam, discussion with treatment team, counseling and educating patient, family and/or staff, documenting clinical information, medication management and coordination of care.     *Please note that this is a verbal dictation therefore any spelling or grammatical errors are due to the Dragon [employer] One system interpretation.  Tobey Jama Barnacle, DNP, AGNP-C Palliative Medicine Team Team Phone # [phone]  Pager [phone]  "

## 2024-07-06 NOTE — Progress Notes (Signed)
 The patient was assessed after staff were called to the room. She stated that she wanted to discontinue BiPAP and did not want it reapplied, expressing a desire to die. The physician was paged and notified of her wishes. The physician arrived at the bedside to speak with the patient. Lasix  20 mg was administered again, nebulizer treatments were given, and the patient was placed on an aerosol mask.

## 2024-07-06 NOTE — Progress Notes (Signed)
 The patient was experiencing respiratory distress with desaturation while on nasal cannula ([place]). She was placed on BiPAP to assist with oxygenation; however, she tolerated it for approximately 20 minutes before removing it, stating she could not breathe. Her oxygen saturation was 98% upon removal, and she was placed back on [place]. She subsequently began to desaturate again and was then placed on a non-rebreather (NRB), with oxygen saturations improving to 93-97%. She later desaturated again into the 80s, at which time she was placed back on BiPAP. The on-call provider and respiratory therapy were notified. A chest X-ray was obtained, and nebulizer treatment and Lasix  were administered. The patient will continue to be monitored.

## 2024-07-06 NOTE — Progress Notes (Signed)
 "  Progress Note  Patient Name: Lauren Clark Date of Encounter: 07/06/2024 [facility] Cardiologist: Vinie JAYSON Maxcy, MD   Interval Summary   S/P TEE/DCCV to NSR yesterday  Tele reviewed and is maintaining NSR Currently on BiPAP. Complains of SOB but no CP   Vital Signs Vitals:   07/06/24 0755 07/06/24 0800 07/06/24 0809 07/06/24 0815  BP:  132/61    Pulse: 62 61 (!) 59 (!) 52  Resp: (!) 36 (!) 28 18 (!) 24  Temp:      TempSrc:      SpO2: (!) 86% (!) 86% 99% 94%  Weight:      Height:        Intake/Output Summary (Last 24 hours) at 07/06/2024 0925 Last data filed at 07/06/2024 0646 Gross per 24 hour  Intake 1314.4 ml  Output 850 ml  Net 464.4 ml      07/06/2024    5:00 AM 07/05/2024    3:46 AM 07/04/2024    3:09 AM  Last 3 Weights  Weight (lbs) 226 lb 10.1 oz 222 lb 14.2 oz 227 lb 8.2 oz  Weight (kg) 102.8 kg 101.1 kg 103.2 kg      Telemetry/ECG  NSR- Personally Reviewed  Physical Exam  GEN: Well nourished, well developed in no acute distress HEENT: Normal NECK: No JVD; No carotid bruits LYMPHATICS: No lymphadenopathy CARDIAC:RRR, no murmurs, rubs, gallops RESPIRATORY:  diffuse wheezes and rhonchi ABDOMEN: Soft, non-tender, non-distended MUSCULOSKELETAL: trace edema; No deformity  SKIN: cool extremities NEUROLOGIC:  Alert and oriented x 3 PSYCHIATRIC:  Normal affect   Assessment & Plan  Lauren Clark is a 69 y.o. female with HFpEF, CKD 3B, paroxysmal AF, morbid obesity s/p gastric sleeve, prior DVT, HTN, HLD, TAA, hypothyroidism, nonobstructive CAD who presents with ADHF in the setting of flu A+ and newly reduced LVEF (55-60%->30-35%) consistent with possible stress-induced cardiomyopathy.     Septic shock -resolved UTI Flu A Multiple organ failure - resolved AKI Transaminitis On admission there were concerns for septic shock, and leukocytosis.  The patient was also positive for flu A.  CT of the abdomen found a density in the left send the upper pole  calyx. 06/30/2024 the patient underwent a cystoscopy and was found to have necrotic tissue in her bladder and kidney.  Cultures are pending.  Urology is considering getting a follow-up cystoscopy. She is currently on Zosyn  and Mycamine .  Because of concerns for a fungal UTI. Creatinine peaked at about 3.68.  Creatinine down to 1.45 Management per urology, primary, and ID.     Acute HFrEF Combined diastolic and systolic heart failure. TTE on 06/27/2024 showed a reduced LVEF of 30 to 35%, apical akinesis, G2 DD, elevated LAP, normal RV systolic function, moderate AR, and IVC with less than 50% respiratory variability. Previous TTE on 05/2024 showed a normal LVEF of 55 to 60%. Given overall clinical picture mentioned above was previously suspected that the acute decline in systolic function is stress cardiomyopathy. I's and O's 1.1L out yesterday and net -2.8 L since admission.  Most recent weight was 102.8 kgs.  On 06/04/2024 the patient weighed 97.5 kg's.  Difficult to assess volume status due to body habitus. GDMT   Plan to recheck echo in 2 to 3 months.  May consider ischemic evaluation at that time if LVEF remains reduced. Cxray this am with worsening patchy opacities, weight is up and has wheezing on exam>>suspect volume overload Got 40mg  IV Lasix  earlier this am so will  Start Lasix  40mg  IV BID starting this afternoon Avoid SGLT2i in setting of fungal UTI Transition Lopressor  to Toprol  XL 50mg  daily (was on Lopressor  25mg  TID but HR in the low 50's so will drop total BB dose to 50mg  daily and consolidate to Toprol ) Consider addition of MRA and Entresto at d/c if BP and renal function allow     Elevated high-sensitivity troponins 157 > 323 > 365 > 77 > 69 Suspect is secondary to demand ischemia from septic shock     Persistent A-fib with RVR S/p TEE/DCCV yesterday Transition IV Amio to  PO 200mg  BID Transition Lopressor  25mg  TID to Toprol  Xl 50mg  daily (will decrease dose slightly due  to bradycardia) Continue Apixaban  5mg  BID     Elevated LFTs ALT peaked at 2684.  They are downtrending. Management per primary     Otherwise management per primary    I spent 35 minutes caring for this patient today face to face, ordering and reviewing labs, reviewing records from 2D echo, Cxray 07/06/24 , seeing the patient, documenting in the record,  For questions or updates, please contact [facility] Please consult [email or web address] for contact info under        Signed, Wilbert Bihari, MD  9:25 AM  07/06/2024  "

## 2024-07-06 NOTE — Progress Notes (Signed)
 "  [facility] PROGRESS NOTE   Lauren Clark FMW:[id number] DOB: 1955-11-11 DOA: 06/26/2024  PCP: [name], MD  Brief History: 69 y.o. female with medical history significant of COPD, chronic hypoxemic respiratory failure on 2 L home oxygen, CAD, A-fib on Eliquis , HFpEF, ascending aortic aneurysm followed by cardiothoracic surgery, hypertension, hyperlipidemia, hypothyroidism, CKD stage IIIa, anemia, anxiety, depression, GERD, glaucoma, vision impairment, morbid obesity status post gastric sleeve surgery, PVD.  Recent hospital admission 12/13-12/24 for AKI, pyelonephritis, ileus, mildly elevated LFTs, and acute on chronic hypoxemic respiratory failure secondary to CHF exacerbation. Creatinine had peaked to 3.86.  Her diuretics were held and she was given IV albumin .  Creatinine improved to 2.97 at the time of discharge and nephrology resumed Lasix ..  Completed 7 days of Rocephin  for pyelonephritis.  She was evaluated by physical therapy and discharged with home health PT/OT afternoon.  Patient returned to the ED via EMS due to concern for shortness of breath, fall at home, and possible syncope. Admitted for acute on chronic hypoxic respiratory failure, Acute influenza PNA with superimposed bacterial PNA, acute COPD exacerbation, NSTEMI, acute CHF exacerbation, prolonged Qtc, Hypotension, acute liver failure, and acute on chronic kidney disease.   Consultants: Cardiology.  Infectious disease.  Urology.  Palliative care  Procedures:  Cystoscopy with cystolitholopaxy of 7 cm bladder collection/foreign body.  Ureteroscopy with laser destruction of lesion of upper and lower pole of left kidney with Dr. Cam on 12/28    Subjective/Interval History: Overnight events noted.  Patient mentions that she is quite short of breath but could not tolerate BiPAP.  Does not like the oxygen mask either.  Willing to try BiPAP again.  Discussed with respiratory therapy the patient's RN.  Patient denies  any chest pain though.     Assessment/Plan:  Acute on chronic hypoxemic respiratory failure Acute influenza A infection Possible bacterial PNA COPD with acute exacerbation Patient was noted to be saturating in the 80s on her home 2 L [place] with EMS.   Patient was hospitalized and started on antibiotics and nebulizer treatments.  She completed 7-day course of ceftriaxone . Blood gas without evidence for hypercapnia. Dyspnea is likely multifactorial. Seems to have worsened in the last 24 hours.  Concern for aspiration.  SLP was ordered yesterday. Overnight patient worsened.  Patient has been started on vancomycin  and cefepime . Patient also noted to be on Augmentin  for Enterococcus faecalis on her urine cultures. MRSA PCR was undetectable on 12/25.  Her respiratory decompensation is either due to aspiration pneumonia or fluid overload.  WBC however noted to be higher which could be from the steroids. Since now she is on vancomycin  and cefepime  we will discontinue the Augmentin .  Vancomycin  should cover the urinary tract infection. Continue with steroids.  Continue with furosemide . Continue nebulizer treatments.   Remains on Tamiflu . She was given furosemide  during earlier part of this admission but had to be held due to AKI.  See below.   Acute on chronic HFpEF Hypotension CXR with no any acute findings but stable cardiomegaly.  Tachyarrhythmia could be causing some volume overload. Echocardiogram does show LVEF of 30 to 35%.  Grade 2 diastolic dysfunction noted. Patient was given furosemide  once in the emergency department and then again on 12/26 but not continued due to AKI. Chest x-ray suggested worsening opacities in the lungs.  Could either be volume overload or new pneumonia, possibly aspiration. Given furosemide  this morning. Cardiology is following.   Acute complicated UTI Bladder/kidney lesions Sepsis, present on  admission Recently finished 7-day course of Rocephin  for  pyelonephritis. Prior UCx with polymicrobial growth at that time UA continues to show evidence of pyuria and bacteria. Ucx with ampicillin-sensitive E. Faecalis Blood culture without any growth. MRSA negative CXR with no any acute findings but stable cardiomegaly CT chest w/o contrast showed interval development of patchy ground-glass opacity with interlobular septal thickening in both upper lobes, L > R. Confluent areas of consolidative airspace disease are seen in a patchy distribution of the mid and lower lungs bilaterally. Imaging features are compatible with multifocal pneumonia. CT abdomen pelvis w/o contrast showed mild L hydroureteronephrosis without stone, high density material at upper pole calyx in L kidney that maybe blood product/infectious lesion/neoplasm, lamellated stone with internal gas in urinary bladder, cholelithiasis  Status post ureterocystoscopy w/ L retrograde pyelogram and Cystolitholapaxy 12/28 concerning for possible L renal and urinary bladder amorphous masses Pathology report of the left kidney wedge biopsy showed degenerated material with oxalate crystals and bacterial overgrowth.  Fungal elements were not identified.  Fungal ball did not show any fungal elements either. Patient has been on broad-spectrum antibiotics.  Subsequently seen by infectious disease.   Patient is currently noted to be on Augmentin .  Started back on micafungin . Mild hematuria noted.  Hemoglobin is stable though. CT scan was repeated on 12/31 which continues to show indeterminate soft tissue density filling defect within the upper pole collecting system of the left kidney.  This measures 2.5 x 1.7 x 1.7 cm.  Interval placement of the left-sided percutaneous nephro ureteral stent was noted with decompression of previous hydronephrosis.  A 1.1 cm left lower pole kidney stone was seen. Urology continues to follow they are considering a second look procedure sometime next week. Okay to discontinue Foley  as per urology. Foley to be kept in since she is being diuresed.   Paroxysmal Atrial fibrillation Patient was on amiodarone  infusion.  Cardiology saw the patient.  Patient underwent DC cardioversion.  Noted to be in sinus rhythm this morning. Changed over to oral amiodarone .  Patient is on apixaban  as well.  Patient is also on metoprolol .   Elevated troponin NSTEMI History of CAD EKG without acute ischemic changes Minimally elevated troponin levels noted.  Cardiology is following.  No plans for catheterization.   Acute liver failure  Patient with significant elevation in AST ALT.  Possibly due to shock liver.   LFTs are trending downwards.   Hepatitis panel was unremarkable.  Acetaminophen  level was low.  Statin is on hold. She will need outpatient evaluation by general surgery for consideration of cholecystectomy after her acute pulmonary and cardiac issues resolve.  Continue to trend LFTs every so often.   AKI on CKD stage IIIa High anion gap metabolic acidosis Baseline creatinine around 1.2.  Creatinine was 2.97 when she was discharged on 12/24.  Likely has higher baseline creatinine now. After getting IV diuretics creatinine did increase.  Now stabilizing again.  Avoid nephrotoxic agents.  Bicarbonate level has improved with sodium bicarbonate .   Strict ins and outs. Creatinine continues to improve.    Chronic Intermittent Back pain Dengenerative disc disease Mechanical Fall No traumatic injuries identified on imaging of chest, pelvis, bilateral knees, left foot, head, and C-spine.  Fall precautions.  MRI L spine 12/29 showed Mild dextroscoliosis of the thoracolumbar spine, Grade 1 degenerative anterolisthesis at L3-4 and L4-5, Mild central spinal canal stenosis and moderate right neural foraminal stenosis at L4-5, Mild-to-moderate central spinal canal stenosis at L3-4, Mild central spinal canal stenosis  at T12-L1. Cont lidocaine  patch PT/OT following   Lactic acidosis Likely  in the setting of hypotension due to possible shock state (septic vs cardiogenic), NSTEMI and acute liver failure.  Resolved now.   QT prolongation QTc 531 on EKG.   Monitor electrolytes.  Avoid QT prolonging medications if possible.  Monitor on telemetry.   Hyperlipidemia Holding statin due to elevated LFTs.   Mild chronic hyponatremia Resolved   Hypothyroidism Continue levothyroxine    Anxiety and depression  Continue home fluoxetine    Obesity Estimated body mass index is 37.71 kg/m as calculated from the following:   Height as of this encounter: 5' 5 (1.651 m).   Weight as of this encounter: 102.8 kg.  Goals of care Patient's clinical condition appears to have worsened in the last 24 to 48 hours.  Palliative care reengaged.  Patient is already DNR.  DVT Prophylaxis: Apixaban  Code Status: DNR Family Communication: Discussed with patient Disposition Plan: SNF when medically stable     Medications: Scheduled:  acetylcysteine   4 mL Nebulization TID   amiodarone   200 mg Oral BID   amoxicillin -clavulanate  1 tablet Oral Q12H   apixaban   5 mg Oral BID   brimonidine   1 drop Right Eye BID   And   timolol   1 drop Right Eye BID   budesonide  (PULMICORT ) nebulizer solution  0.5 mg Nebulization BID   Chlorhexidine  Gluconate Cloth  6 each Topical Daily   diclofenac  Sodium  2 g Topical TID AC & HS   FLUoxetine   40 mg Oral Daily   furosemide   40 mg Intravenous BID   guaiFENesin   600 mg Oral BID   insulin  aspart  0-9 Units Subcutaneous TID WC   ipratropium-albuterol   3 mL Nebulization Q6H   latanoprost   1 drop Right Eye QHS   levothyroxine   112 mcg Oral q morning   lidocaine   1 patch Transdermal Q24H   methylPREDNISolone  (SOLU-MEDROL ) injection  40 mg Intravenous Q12H   metoprolol  succinate  50 mg Oral Daily   oseltamivir   30 mg Oral BID   sodium bicarbonate   325 mg Oral BID   sodium chloride  flush  10-40 mL Intracatheter Q12H   Continuous:  ceFEPime  (MAXIPIME ) IV  Stopped (07/06/24 0617)   micafungin  (MYCAMINE ) 100 mg in sodium chloride  0.9 % 100 mL IVPB Stopped (07/05/24 1039)   [START ON 07/07/2024] vancomycin      PRN:calcium  carbonate, HYDROmorphone  (DILAUDID ) injection, ipratropium-albuterol , oxyCODONE , sodium chloride  flush, trimethobenzamide   Antibiotics: Anti-infectives (From admission, onward)    Start     Dose/Rate Route Frequency Ordered Stop   07/07/24 0800  vancomycin  (VANCOREADY) IVPB 1250 mg/250 mL        1,250 mg 166.7 mL/hr over 90 Minutes Intravenous Every 24 hours 07/06/24 0507     07/06/24 0600  vancomycin  (VANCOREADY) IVPB 2000 mg/400 mL        2,000 mg 200 mL/hr over 120 Minutes Intravenous  Once 07/06/24 0504 07/06/24 0743   07/06/24 0600  ceFEPIme  (MAXIPIME ) 2 g in sodium chloride  0.9 % 100 mL IVPB        2 g 200 mL/hr over 30 Minutes Intravenous Every 12 hours 07/06/24 0504     07/05/24 2200  oseltamivir  (TAMIFLU ) capsule 30 mg        30 mg Oral 2 times daily 07/05/24 1340 07/07/24 2159   07/04/24 1000  micafungin  (MYCAMINE ) 100 mg in sodium chloride  0.9 % 100 mL IVPB        100 mg 105 mL/hr over 1  Hours Intravenous Every 24 hours 07/03/24 1556     07/03/24 1015  amoxicillin -clavulanate (AUGMENTIN ) 875-125 MG per tablet 1 tablet        1 tablet Oral Every 12 hours 07/03/24 0922 07/07/24 0959   07/02/24 1200  amoxicillin -clavulanate (AUGMENTIN ) 500-125 MG per tablet 1 tablet  Status:  Discontinued        1 tablet Oral 2 times daily 07/02/24 1011 07/03/24 0922   07/02/24 1100  amoxicillin -clavulanate (AUGMENTIN ) 875-125 MG per tablet 1 tablet  Status:  Discontinued        1 tablet Oral Every 12 hours 07/02/24 1011 07/02/24 1012   07/01/24 1400  piperacillin -tazobactam (ZOSYN ) IVPB 3.375 g  Status:  Discontinued        3.375 g 12.5 mL/hr over 240 Minutes Intravenous Every 8 hours 07/01/24 1023 07/02/24 1011   06/30/24 1530  micafungin  (MYCAMINE ) 100 mg in sodium chloride  0.9 % 100 mL IVPB  Status:  Discontinued        100  mg 105 mL/hr over 1 Hours Intravenous Every 24 hours 06/30/24 1444 07/03/24 1144   06/29/24 1000  vancomycin  (VANCOCIN ) IVPB 1000 mg/200 mL premix  Status:  Discontinued        1,000 mg 200 mL/hr over 60 Minutes Intravenous Every 48 hours 06/27/24 0624 06/27/24 0924   06/28/24 2200  vancomycin  (VANCOCIN ) IVPB 1000 mg/200 mL premix        1,000 mg 200 mL/hr over 60 Minutes Intravenous  Once 06/28/24 1515 06/28/24 2235   06/28/24 1000  oseltamivir  (TAMIFLU ) capsule 30 mg  Status:  Discontinued        30 mg Oral Daily 06/28/24 0728 07/05/24 1340   06/27/24 1200  piperacillin -tazobactam (ZOSYN ) IVPB 3.375 g  Status:  Discontinued        3.375 g 12.5 mL/hr over 240 Minutes Intravenous Every 8 hours 06/27/24 0624 06/27/24 0924   06/27/24 1200  piperacillin -tazobactam (ZOSYN ) IVPB 2.25 g  Status:  Discontinued        2.25 g 100 mL/hr over 30 Minutes Intravenous Every 8 hours 06/27/24 0924 07/01/24 1023   06/27/24 0924  vancomycin  variable dose per unstable renal function (pharmacist dosing)  Status:  Discontinued         Does not apply See admin instructions 06/27/24 0924 06/29/24 0801   06/27/24 0500  oseltamivir  (TAMIFLU ) capsule 30 mg  Status:  Discontinued        30 mg Oral  Once 06/27/24 0425 06/28/24 0728   06/27/24 0045  piperacillin -tazobactam (ZOSYN ) IVPB 3.375 g        3.375 g 100 mL/hr over 30 Minutes Intravenous  Once 06/27/24 0036 06/27/24 0219   06/27/24 0045  vancomycin  (VANCOREADY) IVPB 1500 mg/300 mL        1,500 mg 150 mL/hr over 120 Minutes Intravenous  Once 06/27/24 0036 06/27/24 0401       Objective:  Vital Signs  Vitals:   07/06/24 0755 07/06/24 0800 07/06/24 0809 07/06/24 0815  BP:  132/61    Pulse: 62 61 (!) 59 (!) 52  Resp: (!) 36 (!) 28 18 (!) 24  Temp:      TempSrc:      SpO2: (!) 86% (!) 86% 99% 94%  Weight:      Height:        Intake/Output Summary (Last 24 hours) at 07/06/2024 1032 Last data filed at 07/06/2024 0646 Gross per 24 hour  Intake 1314.4  ml  Output 850 ml  Net 464.4 ml  Filed Weights   07/04/24 0309 07/05/24 0346 07/06/24 0500  Weight: 103.2 kg 101.1 kg 102.8 kg    General appearance: Awake alert.  In no distress but does appear to be in some discomfort Resp: Noted to be tachypneic.  No use of accessory muscles.  Crackles bilateral bases.  Few wheezes and rhonchi.   Cardio: S1-S2 is normal regular.  No S3-S4.  No rubs murmurs or bruit GI: Abdomen is soft.  Nontender nondistended.  Bowel sounds are present normal.  No masses organomegaly Extremities: Significant deconditioning noted though she is able to move her extremities. No obvious focal neurological deficits.   Lab Results:  Data Reviewed: I have personally reviewed following labs and reports of the imaging studies  CBC: Recent Labs  Lab 06/30/24 0555 06/30/24 1523 07/01/24 1431 07/02/24 0920 07/03/24 0552 07/04/24 0641 07/05/24 0500 07/06/24 0437  WBC 16.8* 39.0*  40.5*   < > 22.9* 15.8* 13.1* 17.2* 19.1*  NEUTROABS 14.9* 35.9*  --   --   --   --   --   --   HGB 11.0* 10.2*  10.1*   < > 10.3* 10.9* 11.4* 10.9* 11.3*  HCT 34.2* 32.2*  31.5*   < > 32.9* 35.2* 37.3 34.7* 38.1  MCV 91.7 94.4  92.9   < > 92.7 96.2 96.4 95.9 99.2  PLT 275 235  247   < > 233 193 159 217 276   < > = values in this interval not displayed.    Basic Metabolic Panel: Recent Labs  Lab 06/30/24 1144 06/30/24 1523 07/02/24 0920 07/03/24 0552 07/04/24 0948 07/05/24 0500 07/06/24 0437  NA  --    < > 139 141 138 142 138  K  --    < > 3.3* 4.2 5.1 4.9 4.7  CL  --    < > 100 104 105 105 98  CO2  --    < > 26 26 20* 29 28  GLUCOSE  --    < > 236* 212* 202* 181* 253*  BUN  --    < > 77* 70* 62* 60* 62*  CREATININE  --    < > 2.20* 1.91* 1.37* 1.30* 1.45*  CALCIUM   --    < > 9.0 9.0 9.5 9.3 9.1  MG 2.0  --   --   --   --   --   --    < > = values in this interval not displayed.    GFR: Estimated Creatinine Clearance: 43.5 mL/min (A) (by C-G formula based on SCr of  1.45 mg/dL (H)).  Liver Function Tests: Recent Labs  Lab 07/02/24 0920 07/03/24 0552 07/04/24 0948 07/05/24 0500 07/06/24 0437  AST 254* 182* 120* 98* 129*  ALT 746* 564* 409* 318* 303*  ALKPHOS 193* 194* 188* 185* 227*  BILITOT 0.8 0.7 0.9 0.7 0.9  PROT 5.9* 5.9* 6.1* 6.0* 6.1*  ALBUMIN  2.8* 2.8* 2.5* 2.9* 2.8*     BNP (last 3 results) Recent Labs    06/27/24 0002 06/27/24 0705  PROBNP 8,463.0* 67,156.9*    Recent Results (from the past 240 hours)  Resp panel by RT-PCR (RSV, Flu A&B, Covid) Anterior Nasal Swab     Status: Abnormal   Collection Time: 06/27/24 12:05 AM   Specimen: Anterior Nasal Swab  Result Value Ref Range Status   SARS Coronavirus 2 by RT PCR NEGATIVE NEGATIVE Final   Influenza A by PCR POSITIVE (A) NEGATIVE Final   Influenza B by PCR  NEGATIVE NEGATIVE Final    Comment: (NOTE) The Xpert Xpress SARS-CoV-2/FLU/RSV plus assay is intended as an aid in the diagnosis of influenza from Nasopharyngeal swab specimens and should not be used as a sole basis for treatment. Nasal washings and aspirates are unacceptable for Xpert Xpress SARS-CoV-2/FLU/RSV testing.  Fact Sheet for Patients: bloggercourse.com  Fact Sheet for Healthcare Providers: seriousbroker.it  This test is not yet approved or cleared by the [place]  FDA and has been authorized for detection and/or diagnosis of SARS-CoV-2 by FDA under an Emergency Use Authorization (EUA). This EUA will remain in effect (meaning this test can be used) for the duration of the COVID-19 declaration under Section 564(b)(1) of the Act, 21 U.S.C. section 360bbb-3(b)(1), unless the authorization is terminated or revoked.     Resp Syncytial Virus by PCR NEGATIVE NEGATIVE Final    Comment: (NOTE) Fact Sheet for Patients: bloggercourse.com  Fact Sheet for Healthcare Providers: seriousbroker.it  This test is  not yet approved or cleared by the [place]  FDA and has been authorized for detection and/or diagnosis of SARS-CoV-2 by FDA under an Emergency Use Authorization (EUA). This EUA will remain in effect (meaning this test can be used) for the duration of the COVID-19 declaration under Section 564(b)(1) of the Act, 21 U.S.C. section 360bbb-3(b)(1), unless the authorization is terminated or revoked.  Performed at [facility], [name] [place], [place] [address]   Urine Culture     Status: Abnormal   Collection Time: 06/27/24  6:20 AM   Specimen: Urine, Clean Catch  Result Value Ref Range Status   Specimen Description URINE, CLEAN CATCH  Final   Special Requests   Final    NONE Performed at [facility], [address] [place], [place] [address]    Culture >=100,000 COLONIES/mL ENTEROCOCCUS FAECALIS (A)  Final   Report Status 07/01/2024 FINAL  Final   Organism ID, Bacteria ENTEROCOCCUS FAECALIS (A)  Final      Susceptibility   Enterococcus faecalis - MIC*    AMPICILLIN <=2 SENSITIVE Sensitive     NITROFURANTOIN <=16 SENSITIVE Sensitive     VANCOMYCIN  1 SENSITIVE Sensitive     * >=100,000 COLONIES/mL ENTEROCOCCUS FAECALIS  Culture, blood (Routine X 2) w Reflex to ID Panel     Status: None   Collection Time: 06/27/24  6:57 AM   Specimen: BLOOD LEFT HAND  Result Value Ref Range Status   Specimen Description BLOOD LEFT HAND  Final   Special Requests   Final    BOTTLES DRAWN AEROBIC ONLY Blood Culture results may not be optimal due to an inadequate volume of blood received in culture bottles   Culture   Final    NO GROWTH 5 DAYS Performed at [facility], [address] [place], [place] [address]    Report Status 07/02/2024 FINAL  Final  Culture, blood (Routine X 2) w Reflex to ID Panel     Status: None   Collection Time: 06/27/24  7:45 AM   Specimen: BLOOD  Result Value Ref Range Status   Specimen Description BLOOD RIGHT ANTECUBITAL  Final   Special  Requests   Final    BOTTLES DRAWN AEROBIC AND ANAEROBIC Blood Culture adequate volume   Culture   Final    NO GROWTH 5 DAYS Performed at [facility], [address] [place], [place] [address]    Report Status 07/02/2024 FINAL  Final  MRSA Next Gen by PCR, Nasal  Status: None   Collection Time: 06/27/24 11:38 AM   Specimen: Nasal Mucosa; Nasal Swab  Result Value Ref Range Status   MRSA by PCR Next Gen NOT DETECTED NOT DETECTED Final    Comment: (NOTE) The GeneXpert MRSA Assay (FDA approved for NASAL specimens only), is one component of a comprehensive MRSA colonization surveillance program. It is not intended to diagnose MRSA infection nor to guide or monitor treatment for MRSA infections. Test performance is not FDA approved in patients less than 83 years old. Performed at [facility], [address], [place], [place] [address]   Fungus Culture With Stain     Status: None (Preliminary result)   Collection Time: 06/30/24 10:34 AM   Specimen: Path fluid; Body Fluid  Result Value Ref Range Status   Fungus Stain Final report  Final    Comment: (NOTE) Performed At: [facility] [address] [place], [place] [id number] Jennette Shorter MD Ey:[id number]    Fungus (Mycology) Culture PENDING  Incomplete   Fungal Source FLUID  Final    Comment: LEFT KIDNEY Performed at [facility], [address], [place], [place] [address]   Aerobic/Anaerobic Culture w Gram Stain (surgical/deep wound)     Status: None   Collection Time: 06/30/24 10:34 AM   Specimen: Path fluid; Body Fluid  Result Value Ref Range Status   Specimen Description FLUID LEFT KIDNEY  Final   Special Requests NONE  Final   Gram Stain RARE WBC SEEN NO ORGANISMS SEEN   Final   Culture   Final    No growth aerobically or anaerobically. Performed at [facility], [address], [place], [place] [address]    Report Status 07/05/2024 FINAL  Final  Acid Fast Smear (AFB)     Status:  None   Collection Time: 06/30/24 10:34 AM   Specimen: Path fluid; Body Fluid  Result Value Ref Range Status   AFB Specimen Processing Concentration  Final   Acid Fast Smear Negative  Final    Comment: (NOTE) Performed At: [facility] [address] [place], [place] [id number] Jennette Shorter MD Ey:[id number]    Source (AFB) FLUID  Final    Comment: LEFT KIDNEY Performed at [facility], [address], [place], [place] [address]   Fungus Culture Result     Status: None   Collection Time: 06/30/24 10:34 AM  Result Value Ref Range Status   Result 1 Comment  Final    Comment: (NOTE) KOH/Calcofluor preparation:  no fungus observed. Performed At: [facility] [address] [place], [place] [id number] Jennette Shorter MD Ey:[id number]       Radiology Studies: DG CHEST PORT 1 VIEW Result Date: 07/06/2024 EXAM: 1 VIEW(S) XRAY OF THE CHEST 07/06/2024 04:03:53 AM COMPARISON: 07/02/2024 CLINICAL HISTORY: SOB (shortness of breath) FINDINGS: LINES, TUBES AND DEVICES: Right upper extremity PICC in place with tip at superior cavoatrial junction. LUNGS AND PLEURA: Worsening bilateral patchy airspace opacities. Bilateral pulmonary nodules. No pleural effusion. No pneumothorax. HEART AND MEDIASTINUM: Cardiomegaly. BONES AND SOFT TISSUES: No acute osseous abnormality. IMPRESSION: 1. Worsening bilateral patchy airspace opacities and bilateral pulmonary nodules. 2. Right upper extremity PICC with tip at the superior cavoatrial junction. 3. Cardiomegaly. Electronically signed by: Evalene Coho MD 07/06/2024 04:20 AM EST RP Workstation: [id number]   ECHO TEE Result Date: 07/05/2024    TRANSESOPHOGEAL ECHO REPORT   Patient Name:   Lauren Clark Date of Exam: 07/05/2024 Medical Rec #:  [id number]  Height:       65.0 in Accession #:    [id number]   Weight:       222.9 lb Date of Birth:  01/06/1956    BSA:          2.071 m Patient Age:    68 years     BP:           115/71 mmHg Patient  Gender: F            HR:           86 bpm. Exam Location:  Inpatient Procedure: Transesophageal Echo, Color Doppler and Cardiac Doppler (Both            Spectral and Color Flow Doppler were utilized during procedure). Indications:     atrial fibrillation  History:         Patient has prior history of Echocardiogram examinations, most                  recent 06/27/2024. Takotsubo, aortic aneurysm and CAD, chronic                  kidney disease, Arrythmias:Atrial Fibrillation; Risk                  Factors:Hypertension and Dyslipidemia.  Sonographer:     Tinnie Barefoot RDCS Referring Phys:  [phone] MIHAI CROITORU Diagnosing Phys: Jerel Balding MD PROCEDURE: After discussion of the risks and benefits of a TEE, an informed consent was obtained from the patient. The transesophogeal probe was passed without difficulty through the esophogus of the patient. Imaged were obtained with the patient in a supine position. Sedation performed by different physician. The patient developed no complications during the procedure. A successful direct current cardioversion was performed at 200 joules with 1 attempt.  IMPRESSIONS  1. Left ventricular ejection fraction, by estimation, is 50%. The left ventricle has mildly decreased function. The left ventricle demonstrates global hypokinesis.  2. Right ventricular systolic function is mildly reduced. The right ventricular size is normal.  3. No left atrial/left atrial appendage thrombus was detected. The LAA emptying velocity was 30 cm/s.  4. The mitral valve is degenerative. Mild mitral valve regurgitation.  5. The aortic valve is tricuspid. There is mild calcification of the aortic valve. There is mild thickening of the aortic valve. Aortic valve regurgitation is moderate. Aortic valve sclerosis/calcification is present, without any evidence of aortic stenosis. Aortic regurgitation PHT measures 350 msec.  6. 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 There isno visible residual aaortic dissection. Aortic dilatation noted. 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 Comparison(s): Left ventricular function has improved and the anteroapical regional wall motion abnormality has resolved. FINDINGS  Left Ventricle: Left ventricular ejection fraction, by estimation, is 50%. The left ventricle has mildly decreased function. The left ventricle demonstrates global hypokinesis. The left ventricular internal cavity size was normal in size. Right Ventricle: The right ventricular size is normal. No increase in right ventricular wall thickness. Right ventricular systolic function is mildly reduced. Left Atrium: Left atrial size was normal in size. No left atrial/left atrial appendage thrombus was detected. The LAA emptying velocity was 30 cm/s. Right Atrium: Right atrial size was normal in size. Prominent Eustachian valve. Pericardium: There is no evidence of pericardial effusion. Mitral Valve: The mitral valve is degenerative in appearance. Mild mitral valve regurgitation. Tricuspid  Valve: The tricuspid valve is not well visualized. Tricuspid valve regurgitation is trivial. Aortic Valve: The aortic valve is tricuspid. There is mild calcification of the aortic valve. There is mild thickening of the aortic valve. Aortic valve regurgitation is moderate. Aortic regurgitation PHT measures 350 msec. Aortic valve sclerosis/calcification is present, without any evidence of aortic stenosis. Pulmonic Valve: The pulmonic valve was grossly normal. Pulmonic valve regurgitation is trivial. 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 There isno visible residual aaortic dissection. Aortic dilatation noted. There is severe dilatation  of the aortic root, measuring 51 mm. IAS/Shunts: No  atrial level shunt detected by color flow Doppler.  AORTIC VALVE AI PHT:      350 msec  AORTA Ao Root diam: 5.50 cm Ao Asc diam:  4.10 cm Jerel Croitoru MD Electronically signed by Jerel Balding MD Signature Date/Time: 07/05/2024/2:55:45 PM    Final    EP STUDY Result Date: 07/05/2024 See surgical note for result.       LOS: 9 days   Deondrea Markos [employer] on [email or web address]  07/06/2024, 10:32 AM   "

## 2024-07-06 NOTE — Plan of Care (Signed)

## 2024-07-07 DIAGNOSIS — J9621 Acute and chronic respiratory failure with hypoxia: Secondary | ICD-10-CM | POA: Diagnosis not present

## 2024-07-07 DIAGNOSIS — R7401 Elevation of levels of liver transaminase levels: Secondary | ICD-10-CM | POA: Diagnosis not present

## 2024-07-07 DIAGNOSIS — J690 Pneumonitis due to inhalation of food and vomit: Secondary | ICD-10-CM | POA: Diagnosis not present

## 2024-07-07 DIAGNOSIS — I5043 Acute on chronic combined systolic (congestive) and diastolic (congestive) heart failure: Secondary | ICD-10-CM | POA: Diagnosis not present

## 2024-07-07 LAB — GLUCOSE, CAPILLARY
Glucose-Capillary: 262 mg/dL — ABNORMAL HIGH (ref 70–99)
Glucose-Capillary: 184 mg/dL — ABNORMAL HIGH (ref 70–99)
Glucose-Capillary: 164 mg/dL — ABNORMAL HIGH (ref 70–99)
Glucose-Capillary: 180 mg/dL — ABNORMAL HIGH (ref 70–99)

## 2024-07-07 LAB — CBC
HCT: 36.2 % (ref 36.0–46.0)
Hemoglobin: 11.3 g/dL — ABNORMAL LOW (ref 12.0–15.0)
MCH: 30.3 pg (ref 26.0–34.0)
MCHC: 31.2 g/dL (ref 30.0–36.0)
MCV: 97.1 fL (ref 80.0–100.0)
Platelets: 242 K/uL (ref 150–400)
RBC: 3.73 MIL/uL — ABNORMAL LOW (ref 3.87–5.11)
RDW: 18.6 % — ABNORMAL HIGH (ref 11.5–15.5)
WBC: 16.1 K/uL — ABNORMAL HIGH (ref 4.0–10.5)
nRBC: 1.6 % — ABNORMAL HIGH (ref 0.0–0.2)

## 2024-07-07 LAB — BASIC METABOLIC PANEL WITH GFR
Anion gap: 10 (ref 5–15)
BUN: 71 mg/dL — ABNORMAL HIGH (ref 8–23)
CO2: 29 mmol/L (ref 22–32)
Calcium: 9.1 mg/dL (ref 8.9–10.3)
Chloride: 101 mmol/L (ref 98–111)
Creatinine, Ser: 1.65 mg/dL — ABNORMAL HIGH (ref 0.44–1.00)
GFR, Estimated: 33 mL/min — ABNORMAL LOW
Glucose, Bld: 178 mg/dL — ABNORMAL HIGH (ref 70–99)
Potassium: 4.6 mmol/L (ref 3.5–5.1)
Sodium: 140 mmol/L (ref 135–145)

## 2024-07-07 MED ORDER — HALOPERIDOL LACTATE 5 MG/ML IJ SOLN
2.0000 mg | Freq: Once | INTRAMUSCULAR | Status: AC
  Administered 2024-07-07: 2 mg via INTRAVENOUS
  Filled 2024-07-07: qty 1

## 2024-07-07 NOTE — Progress Notes (Addendum)
 "  [facility] PROGRESS NOTE   Lauren Clark FMW:[id number] DOB: Jan 03, 1956 DOA: 06/26/2024  PCP: [name], MD  Brief History: 69 y.o. female with medical history significant of COPD, chronic hypoxemic respiratory failure on 2 L home oxygen, CAD, A-fib on Eliquis , HFpEF, ascending aortic aneurysm followed by cardiothoracic surgery, hypertension, hyperlipidemia, hypothyroidism, CKD stage IIIa, anemia, anxiety, depression, GERD, glaucoma, vision impairment, morbid obesity status post gastric sleeve surgery, PVD.  Recent hospital admission 12/13-12/24 for AKI, pyelonephritis, ileus, mildly elevated LFTs, and acute on chronic hypoxemic respiratory failure secondary to CHF exacerbation. Creatinine had peaked to 3.86.  Her diuretics were held and she was given IV albumin .  Creatinine improved to 2.97 at the time of discharge and nephrology resumed Lasix ..  Completed 7 days of Rocephin  for pyelonephritis.  She was evaluated by physical therapy and discharged with home health PT/OT afternoon.  Patient returned to the ED via EMS due to concern for shortness of breath, fall at home, and possible syncope. Admitted for acute on chronic hypoxic respiratory failure, Acute influenza PNA with superimposed bacterial PNA, acute COPD exacerbation, NSTEMI, acute CHF exacerbation, prolonged Qtc, Hypotension, acute liver failure, and acute on chronic kidney disease.   Consultants: Cardiology.  Infectious disease.  Urology.  Palliative care  Procedures:  Cystoscopy with cystolitholopaxy of 7 cm bladder collection/foreign body.  Ureteroscopy with laser destruction of lesion of upper and lower pole of left kidney with Dr. Cam on 12/28    Subjective/Interval History: Patient seems to be irritable this morning.  States that she is still short of breath though she does appear to be more comfortable today compared to yesterday.  On oxygen via mask.      Assessment/Plan:  Acute on chronic hypoxemic  respiratory failure Acute influenza A infection Possible bacterial PNA COPD with acute exacerbation Patient was noted to be saturating in the 80s on her home 2 L [place] with EMS.   Patient was hospitalized and started on antibiotics and nebulizer treatments.  She completed 7-day course of ceftriaxone . Blood gas without evidence for hypercapnia. Dyspnea is likely multifactorial including COPD and CHF. Patient's respiratory status was worse yesterday.  She required BiPAP for few hours.  There was also concern for aspiration. Patient was started on broad-spectrum antibiotics with vancomycin  and cefepime .  Augmentin  was discontinued.  Patient was to be on Augmentin  for Enterococcus faecalis on her urine cultures.  Vancomycin  should cover this. MRSA PCR was undetectable on 12/25.   WBC however noted to be higher which could be from the steroids. Continue with steroids.  Continue nebulizer treatments.  Remains on Tamiflu . Getting IV furosemide  per cardiology.   Due to concern for aspiration patient was seen by speech therapy.  They are worried about esophageal dysphagia.  However patient's respiratory status is is too tenuous for her to undergo any kind of diagnostic studies at this time.  Currently on D1 Diet.   Acute on chronic HFpEF Hypotension Echocardiogram does show LVEF of 30 to 35%.  Grade 2 diastolic dysfunction noted. Patient was given furosemide  once in the emergency department and then again on 12/26 but not continued due to AKI. Chest x-ray suggested worsening opacities in the lungs.  Could either be volume overload or new pneumonia, possibly aspiration. Seen by cardiology and started on IV furosemide .  Ins and outs and daily weights.  Monitor renal function closely.   Acute complicated UTI Bladder/kidney lesions Sepsis, present on admission Recently finished 7-day course of Rocephin  for pyelonephritis. Prior UCx  with polymicrobial growth at that time UA continues to show evidence of  pyuria and bacteria. Ucx with ampicillin-sensitive E. Faecalis Blood culture without any growth. MRSA negative CXR with no any acute findings but stable cardiomegaly CT chest w/o contrast showed interval development of patchy ground-glass opacity with interlobular septal thickening in both upper lobes, L > R. Confluent areas of consolidative airspace disease are seen in a patchy distribution of the mid and lower lungs bilaterally. Imaging features are compatible with multifocal pneumonia. CT abdomen pelvis w/o contrast showed mild L hydroureteronephrosis without stone, high density material at upper pole calyx in L kidney that maybe blood product/infectious lesion/neoplasm, lamellated stone with internal gas in urinary bladder, cholelithiasis  Status post ureterocystoscopy w/ L retrograde pyelogram and Cystolitholapaxy 12/28 concerning for possible L renal and urinary bladder amorphous masses Pathology report of the left kidney wedge biopsy showed degenerated material with oxalate crystals and bacterial overgrowth.  Fungal elements were not identified.  Fungal ball did not show any fungal elements either. Patient has been on broad-spectrum antibiotics.  Subsequently seen by infectious disease.   Patient is currently noted to be on Augmentin .  Started back on micafungin . Mild hematuria noted.  Hemoglobin is stable though. CT scan was repeated on 12/31 which continues to show indeterminate soft tissue density filling defect within the upper pole collecting system of the left kidney.  This measures 2.5 x 1.7 x 1.7 cm.  Interval placement of the left-sided percutaneous nephro ureteral stent was noted with decompression of previous hydronephrosis.  A 1.1 cm left lower pole kidney stone was seen. Urology continues to follow they are considering a second look procedure sometime next week. Okay to discontinue Foley as per urology. Foley to be kept in since she is being diuresed.   Paroxysmal Atrial  fibrillation Patient was on amiodarone  infusion.  Cardiology saw the patient.  Patient underwent DC cardioversion.  Noted to be in sinus rhythm this morning. Changed over to oral amiodarone .  Patient is on apixaban  as well.  Patient is also on metoprolol .   Elevated troponin NSTEMI History of CAD EKG without acute ischemic changes Minimally elevated troponin levels noted.  Cardiology is following.  No plans for catheterization.   Acute liver failure  Patient with significant elevation in AST ALT.  Possibly due to shock liver.   Hepatitis panel was unremarkable.  Acetaminophen  level was low.  Statin is on hold. She will need outpatient evaluation by general surgery for consideration of cholecystectomy after her acute pulmonary and cardiac issues resolve.  Continue to trend LFTs every so often.  Improvement noted over the past few days.   AKI on CKD stage IIIa High anion gap metabolic acidosis Baseline creatinine around 1.2.  Creatinine was 2.97 when she was discharged on 12/24.  Likely has higher baseline creatinine now. Creatinine did rise with furosemide .  Noted to be high today as well.  But she is significantly volume overloaded so IV furosemide  is being continued.  Bicarbonate level has improved.  Continue to monitor closely.   Chronic Intermittent Back pain Dengenerative disc disease Mechanical Fall No traumatic injuries identified on imaging of chest, pelvis, bilateral knees, left foot, head, and C-spine.  Fall precautions.  MRI L spine 12/29 showed Mild dextroscoliosis of the thoracolumbar spine, Grade 1 degenerative anterolisthesis at L3-4 and L4-5, Mild central spinal canal stenosis and moderate right neural foraminal stenosis at L4-5, Mild-to-moderate central spinal canal stenosis at L3-4, Mild central spinal canal stenosis at T12-L1. Cont lidocaine  patch PT/OT following  Lactic acidosis Likely in the setting of hypotension due to possible shock state (septic vs cardiogenic),  NSTEMI and acute liver failure.  Resolved now.   QT prolongation QTc 531 on EKG.   Monitor electrolytes.  Avoid QT prolonging medications if possible.  Monitor on telemetry.   Hyperlipidemia Holding statin due to elevated LFTs.   Mild chronic hyponatremia Resolved   Hypothyroidism Continue levothyroxine    Anxiety and depression  Continue home fluoxetine    Obesity Estimated body mass index is 37.82 kg/m as calculated from the following:   Height as of this encounter: 5' 5 (1.651 m).   Weight as of this encounter: 103.1 kg.  Goals of care Patient is DNR.  Palliative care is following.  DVT Prophylaxis: Apixaban  Code Status: DNR Family Communication: Discussed with patient Disposition Plan: SNF when medically stable     Medications: Scheduled:  acetylcysteine   4 mL Nebulization TID   amiodarone   200 mg Oral BID   apixaban   5 mg Oral BID   brimonidine   1 drop Right Eye BID   And   timolol   1 drop Right Eye BID   budesonide  (PULMICORT ) nebulizer solution  0.5 mg Nebulization BID   Chlorhexidine  Gluconate Cloth  6 each Topical Daily   diclofenac  Sodium  2 g Topical TID AC & HS   FLUoxetine   40 mg Oral Daily   furosemide   40 mg Intravenous BID   guaiFENesin   600 mg Oral BID   insulin  aspart  0-9 Units Subcutaneous TID WC   ipratropium-albuterol   3 mL Nebulization TID   latanoprost   1 drop Right Eye QHS   levothyroxine   112 mcg Oral q morning   lidocaine   1 patch Transdermal Q24H   methylPREDNISolone  (SOLU-MEDROL ) injection  40 mg Intravenous Q12H   metoprolol  succinate  50 mg Oral Daily   mouth rinse  15 mL Mouth Rinse 4 times per day   oseltamivir   30 mg Oral BID   sodium bicarbonate   325 mg Oral BID   sodium chloride  flush  10-40 mL Intracatheter Q12H   Continuous:  ceFEPime  (MAXIPIME ) IV 2 g (07/07/24 0537)   micafungin  (MYCAMINE ) 100 mg in sodium chloride  0.9 % 100 mL IVPB 100 mg (07/06/24 1242)   vancomycin  1,250 mg (07/07/24 0916)   PRN:calcium   carbonate, HYDROmorphone  (DILAUDID ) injection, ipratropium-albuterol , mouth rinse, oxyCODONE , sodium chloride  flush, trimethobenzamide   Antibiotics: Anti-infectives (From admission, onward)    Start     Dose/Rate Route Frequency Ordered Stop   07/07/24 0800  vancomycin  (VANCOREADY) IVPB 1250 mg/250 mL        1,250 mg 166.7 mL/hr over 90 Minutes Intravenous Every 24 hours 07/06/24 0507     07/06/24 0600  vancomycin  (VANCOREADY) IVPB 2000 mg/400 mL        2,000 mg 200 mL/hr over 120 Minutes Intravenous  Once 07/06/24 0504 07/06/24 0743   07/06/24 0600  ceFEPIme  (MAXIPIME ) 2 g in sodium chloride  0.9 % 100 mL IVPB        2 g 200 mL/hr over 30 Minutes Intravenous Every 12 hours 07/06/24 0504     07/05/24 2200  oseltamivir  (TAMIFLU ) capsule 30 mg        30 mg Oral 2 times daily 07/05/24 1340 07/07/24 2159   07/04/24 1000  micafungin  (MYCAMINE ) 100 mg in sodium chloride  0.9 % 100 mL IVPB        100 mg 105 mL/hr over 1 Hours Intravenous Every 24 hours 07/03/24 1556     07/03/24 1015  amoxicillin -clavulanate (AUGMENTIN )  875-125 MG per tablet 1 tablet  Status:  Discontinued        1 tablet Oral Every 12 hours 07/03/24 0922 07/06/24 1041   07/02/24 1200  amoxicillin -clavulanate (AUGMENTIN ) 500-125 MG per tablet 1 tablet  Status:  Discontinued        1 tablet Oral 2 times daily 07/02/24 1011 07/03/24 0922   07/02/24 1100  amoxicillin -clavulanate (AUGMENTIN ) 875-125 MG per tablet 1 tablet  Status:  Discontinued        1 tablet Oral Every 12 hours 07/02/24 1011 07/02/24 1012   07/01/24 1400  piperacillin -tazobactam (ZOSYN ) IVPB 3.375 g  Status:  Discontinued        3.375 g 12.5 mL/hr over 240 Minutes Intravenous Every 8 hours 07/01/24 1023 07/02/24 1011   06/30/24 1530  micafungin  (MYCAMINE ) 100 mg in sodium chloride  0.9 % 100 mL IVPB  Status:  Discontinued        100 mg 105 mL/hr over 1 Hours Intravenous Every 24 hours 06/30/24 1444 07/03/24 1144   06/29/24 1000  vancomycin  (VANCOCIN ) IVPB 1000  mg/200 mL premix  Status:  Discontinued        1,000 mg 200 mL/hr over 60 Minutes Intravenous Every 48 hours 06/27/24 0624 06/27/24 0924   06/28/24 2200  vancomycin  (VANCOCIN ) IVPB 1000 mg/200 mL premix        1,000 mg 200 mL/hr over 60 Minutes Intravenous  Once 06/28/24 1515 06/28/24 2235   06/28/24 1000  oseltamivir  (TAMIFLU ) capsule 30 mg  Status:  Discontinued        30 mg Oral Daily 06/28/24 0728 07/05/24 1340   06/27/24 1200  piperacillin -tazobactam (ZOSYN ) IVPB 3.375 g  Status:  Discontinued        3.375 g 12.5 mL/hr over 240 Minutes Intravenous Every 8 hours 06/27/24 0624 06/27/24 0924   06/27/24 1200  piperacillin -tazobactam (ZOSYN ) IVPB 2.25 g  Status:  Discontinued        2.25 g 100 mL/hr over 30 Minutes Intravenous Every 8 hours 06/27/24 0924 07/01/24 1023   06/27/24 0924  vancomycin  variable dose per unstable renal function (pharmacist dosing)  Status:  Discontinued         Does not apply See admin instructions 06/27/24 0924 06/29/24 0801   06/27/24 0500  oseltamivir  (TAMIFLU ) capsule 30 mg  Status:  Discontinued        30 mg Oral  Once 06/27/24 0425 06/28/24 0728   06/27/24 0045  piperacillin -tazobactam (ZOSYN ) IVPB 3.375 g        3.375 g 100 mL/hr over 30 Minutes Intravenous  Once 06/27/24 0036 06/27/24 0219   06/27/24 0045  vancomycin  (VANCOREADY) IVPB 1500 mg/300 mL        1,500 mg 150 mL/hr over 120 Minutes Intravenous  Once 06/27/24 0036 06/27/24 0401       Objective:  Vital Signs  Vitals:   07/07/24 0318 07/07/24 0810 07/07/24 0816 07/07/24 0854  BP: (!) 97/47 106/78    Pulse: 61 (!) 58 (!) 59 (!) 56  Resp: 20 (!) 22 18 17   Temp: (!) 97.1 F (36.2 C) 97.9 F (36.6 C)    TempSrc: Axillary Oral    SpO2: 97% 95% 91% 98%  Weight: 103.1 kg     Height:        Intake/Output Summary (Last 24 hours) at 07/07/2024 1053 Last data filed at 07/07/2024 0327 Gross per 24 hour  Intake --  Output 550 ml  Net -550 ml   Filed Weights   07/05/24 0346 07/06/24  0500  07/07/24 0318  Weight: 101.1 kg 102.8 kg 103.1 kg    General appearance: Awake alert.  In no distress Resp: Noted to be tachypneic.  Some use of accessory muscles noted.  Better today compared to yesterday.  Crackles bilaterally.  Occasional wheezing. Cardio: S1-S2 is normal regular.  No S3-S4.  No rubs murmurs or bruit GI: Abdomen is soft.  Nontender nondistended.  Bowel sounds are present normal.  No masses organomegaly Extremities: Significantly deconditioned.  No focal neurological deficits.  Lab Results:  Data Reviewed: I have personally reviewed following labs and reports of the imaging studies  CBC: Recent Labs  Lab 06/30/24 1523 07/01/24 1431 07/03/24 0552 07/04/24 0641 07/05/24 0500 07/06/24 0437 07/07/24 0355  WBC 39.0*  40.5*   < > 15.8* 13.1* 17.2* 19.1* 16.1*  NEUTROABS 35.9*  --   --   --   --   --   --   HGB 10.2*  10.1*   < > 10.9* 11.4* 10.9* 11.3* 11.3*  HCT 32.2*  31.5*   < > 35.2* 37.3 34.7* 38.1 36.2  MCV 94.4  92.9   < > 96.2 96.4 95.9 99.2 97.1  PLT 235  247   < > 193 159 217 276 242   < > = values in this interval not displayed.    Basic Metabolic Panel: Recent Labs  Lab 06/30/24 1144 06/30/24 1523 07/03/24 0552 07/04/24 0948 07/05/24 0500 07/06/24 0437 07/07/24 0355  NA  --    < > 141 138 142 138 140  K  --    < > 4.2 5.1 4.9 4.7 4.6  CL  --    < > 104 105 105 98 101  CO2  --    < > 26 20* 29 28 29   GLUCOSE  --    < > 212* 202* 181* 253* 178*  BUN  --    < > 70* 62* 60* 62* 71*  CREATININE  --    < > 1.91* 1.37* 1.30* 1.45* 1.65*  CALCIUM   --    < > 9.0 9.5 9.3 9.1 9.1  MG 2.0  --   --   --   --   --   --    < > = values in this interval not displayed.    GFR: Estimated Creatinine Clearance: 38.3 mL/min (A) (by C-G formula based on SCr of 1.65 mg/dL (H)).  Liver Function Tests: Recent Labs  Lab 07/02/24 0920 07/03/24 0552 07/04/24 0948 07/05/24 0500 07/06/24 0437  AST 254* 182* 120* 98* 129*  ALT 746* 564* 409* 318* 303*   ALKPHOS 193* 194* 188* 185* 227*  BILITOT 0.8 0.7 0.9 0.7 0.9  PROT 5.9* 5.9* 6.1* 6.0* 6.1*  ALBUMIN  2.8* 2.8* 2.5* 2.9* 2.8*     BNP (last 3 results) Recent Labs    06/27/24 0002 06/27/24 0705  PROBNP 8,463.0* 32,843.0*    Recent Results (from the past 240 hours)  MRSA Next Gen by PCR, Nasal     Status: None   Collection Time: 06/27/24 11:38 AM   Specimen: Nasal Mucosa; Nasal Swab  Result Value Ref Range Status   MRSA by PCR Next Gen NOT DETECTED NOT DETECTED Final    Comment: (NOTE) The GeneXpert MRSA Assay (FDA approved for NASAL specimens only), is one component of a comprehensive MRSA colonization surveillance program. It is not intended to diagnose MRSA infection nor to guide or monitor treatment for MRSA infections. Test performance is not FDA approved in  patients less than 48 years old. Performed at [facility], [address] [place], [place] [address]   Fungus Culture With Stain     Status: None (Preliminary result)   Collection Time: 06/30/24 10:34 AM   Specimen: Path fluid; Body Fluid  Result Value Ref Range Status   Fungus Stain Final report  Final    Comment: (NOTE) Performed At: [facility] [address] [place], [place] [id number] Jennette Shorter MD Ey:[id number]    Fungus (Mycology) Culture PENDING  Incomplete   Fungal Source FLUID  Final    Comment: LEFT KIDNEY Performed at [facility], [address], [place], [place] [address]   Aerobic/Anaerobic Culture w Gram Stain (surgical/deep wound)     Status: None   Collection Time: 06/30/24 10:34 AM   Specimen: Path fluid; Body Fluid  Result Value Ref Range Status   Specimen Description FLUID LEFT KIDNEY  Final   Special Requests NONE  Final   Gram Stain RARE WBC SEEN NO ORGANISMS SEEN   Final   Culture   Final    No growth aerobically or anaerobically. Performed at [facility], [address] [place], [place] [address]    Report Status 07/05/2024 FINAL  Final   Acid Fast Smear (AFB)     Status: None   Collection Time: 06/30/24 10:34 AM   Specimen: Path fluid; Body Fluid  Result Value Ref Range Status   AFB Specimen Processing Concentration  Final   Acid Fast Smear Negative  Final    Comment: (NOTE) Performed At: [facility] [address] [place], [place] [id number] Jennette Shorter MD Ey:[id number]    Source (AFB) FLUID  Final    Comment: LEFT KIDNEY Performed at [facility], [address], [place], [place] [address]   Fungus Culture Result     Status: None   Collection Time: 06/30/24 10:34 AM  Result Value Ref Range Status   Result 1 Comment  Final    Comment: (NOTE) KOH/Calcofluor preparation:  no fungus observed. Performed At: [facility] [address] [place], [place] [id number] Jennette Shorter MD Ey:[id number]   Culture, blood (Routine X 2) w Reflex to ID Panel     Status: None (Preliminary result)   Collection Time: 07/06/24 10:47 AM   Specimen: BLOOD LEFT HAND  Result Value Ref Range Status   Specimen Description BLOOD LEFT HAND  Final   Special Requests   Final    BOTTLES DRAWN AEROBIC ONLY Blood Culture results may not be optimal due to an inadequate volume of blood received in culture bottles   Culture   Final    NO GROWTH < 24 HOURS Performed at [facility], [address], [place], [place] [address]    Report Status PENDING  Incomplete  Culture, blood (Routine X 2) w Reflex to ID Panel     Status: None (Preliminary result)   Collection Time: 07/06/24 10:48 AM   Specimen: BLOOD LEFT HAND  Result Value Ref Range Status   Specimen Description BLOOD LEFT HAND  Final   Special Requests   Final    BOTTLES DRAWN AEROBIC ONLY Blood Culture results may not be optimal due to an inadequate volume of blood received in culture bottles   Culture   Final    NO GROWTH < 24 HOURS Performed at [facility], [address] [place], [place] [address]    Report Status PENDING  Incomplete  Radiology Studies: DG CHEST PORT 1 VIEW Result Date: 07/06/2024 EXAM: 1 VIEW(S) XRAY OF THE CHEST 07/06/2024 04:03:53 AM COMPARISON: 07/02/2024 CLINICAL HISTORY: SOB (shortness of breath) FINDINGS: LINES, TUBES AND DEVICES: Right upper extremity PICC in place with tip at superior cavoatrial junction. LUNGS AND PLEURA: Worsening bilateral patchy airspace opacities. Bilateral pulmonary nodules. No pleural effusion. No pneumothorax. HEART AND MEDIASTINUM: Cardiomegaly. BONES AND SOFT TISSUES: No acute osseous abnormality. IMPRESSION: 1. Worsening bilateral patchy airspace opacities and bilateral pulmonary nodules. 2. Right upper extremity PICC with tip at the superior cavoatrial junction. 3. Cardiomegaly. Electronically signed by: Evalene Coho MD 07/06/2024 04:20 AM EST RP Workstation: [id number]   ECHO TEE Result Date: 07/05/2024    TRANSESOPHOGEAL ECHO REPORT   Patient Name:   Lauren Clark Date of Exam: 07/05/2024 Medical Rec #:  [id number]    Height:       65.0 in Accession #:    [id number]   Weight:       222.9 lb Date of Birth:  1956-03-01    BSA:          2.071 m Patient Age:    68 years     BP:           115/71 mmHg Patient Gender: F            HR:           86 bpm. Exam Location:  Inpatient Procedure: Transesophageal Echo, Color Doppler and Cardiac Doppler (Both            Spectral and Color Flow Doppler were utilized during procedure). Indications:     atrial fibrillation  History:         Patient has prior history of Echocardiogram examinations, most                  recent 06/27/2024. Takotsubo, aortic aneurysm and CAD, chronic                  kidney disease, Arrythmias:Atrial Fibrillation; Risk                  Factors:Hypertension and Dyslipidemia.  Sonographer:     Tinnie Barefoot RDCS Referring Phys:  [phone] MIHAI CROITORU Diagnosing Phys: Jerel Balding MD PROCEDURE: After discussion of the risks and benefits of a TEE, an informed consent was obtained from the patient. The transesophogeal  probe was passed without difficulty through the esophogus of the patient. Imaged were obtained with the patient in a supine position. Sedation performed by different physician. The patient developed no complications during the procedure. A successful direct current cardioversion was performed at 200 joules with 1 attempt.  IMPRESSIONS  1. Left ventricular ejection fraction, by estimation, is 50%. The left ventricle has mildly decreased function. The left ventricle demonstrates global hypokinesis.  2. Right ventricular systolic function is mildly reduced. The right ventricular size is normal.  3. No left atrial/left atrial appendage thrombus was detected. The LAA emptying velocity was 30 cm/s.  4. The mitral valve is degenerative. Mild mitral valve regurgitation.  5. The aortic valve is tricuspid. There is mild calcification of the aortic valve. There is mild thickening of the aortic valve. Aortic valve regurgitation is moderate. Aortic valve sclerosis/calcification is present, without any evidence of aortic stenosis. Aortic regurgitation PHT measures 350 msec.  6. 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 There isno visible residual aaortic dissection. Aortic  dilatation noted. 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 Comparison(s): Left ventricular function has improved and the anteroapical regional wall motion abnormality has resolved. FINDINGS  Left Ventricle: Left ventricular ejection fraction, by estimation, is 50%. The left ventricle has mildly decreased function. The left ventricle demonstrates global hypokinesis. The left ventricular internal cavity size was normal in size. Right Ventricle: The right ventricular size is normal. No increase in right ventricular wall thickness. Right ventricular systolic function is mildly reduced. Left Atrium: Left atrial size was normal in size. No  left atrial/left atrial appendage thrombus was detected. The LAA emptying velocity was 30 cm/s. Right Atrium: Right atrial size was normal in size. Prominent Eustachian valve. Pericardium: There is no evidence of pericardial effusion. Mitral Valve: The mitral valve is degenerative in appearance. Mild mitral valve regurgitation. Tricuspid Valve: The tricuspid valve is not well visualized. Tricuspid valve regurgitation is trivial. Aortic Valve: The aortic valve is tricuspid. There is mild calcification of the aortic valve. There is mild thickening of the aortic valve. Aortic valve regurgitation is moderate. Aortic regurgitation PHT measures 350 msec. Aortic valve sclerosis/calcification is present, without any evidence of aortic stenosis. Pulmonic Valve: The pulmonic valve was grossly normal. Pulmonic valve regurgitation is trivial. 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 There isno visible residual aaortic dissection. Aortic dilatation noted. There is severe dilatation  of the aortic root, measuring 51 mm. IAS/Shunts: No atrial level shunt detected by color flow Doppler.  AORTIC VALVE AI PHT:      350 msec  AORTA Ao Root diam: 5.50 cm Ao Asc diam:  4.10 cm Jerel Croitoru MD Electronically signed by Jerel Balding MD Signature Date/Time: 07/05/2024/2:55:45 PM    Final    EP STUDY Result Date: 07/05/2024 See surgical note for result.       LOS: 10 days   Kerrick Miler [employer] on [email or web address]  07/07/2024, 10:53 AM   "

## 2024-07-07 NOTE — Progress Notes (Signed)
 "  Progress Note  Patient Name: Lauren Clark Date of Encounter: 07/07/2024 [facility] Cardiologist: Vinie JAYSON Maxcy, MD   Interval Summary   S/P TEE/DCCV to NSR   Tele reviewed and is maintaining NSR this admission  Shortness of breath improved.  Denies any chest pain currently getting a breathing treatment  Vital Signs Vitals:   07/06/24 2357 07/07/24 0300 07/07/24 0318 07/07/24 0810  BP: (!) 102/58  (!) 97/47 106/78  Pulse: (!) 54 (!) 58 61 (!) 58  Resp: (!) 21 (!) 23 20 (!) 22  Temp: 98.8 F (37.1 C)  (!) 97.1 F (36.2 C) 97.9 F (36.6 C)  TempSrc: Axillary  Axillary Oral  SpO2: 95% (!) 82% 97% 95%  Weight:   103.1 kg   Height:        Intake/Output Summary (Last 24 hours) at 07/07/2024 0811 Last data filed at 07/07/2024 0327 Gross per 24 hour  Intake --  Output 550 ml  Net -550 ml      07/07/2024    3:18 AM 07/06/2024    5:00 AM 07/05/2024    3:46 AM  Last 3 Weights  Weight (lbs) 227 lb 4.7 oz 226 lb 10.1 oz 222 lb 14.2 oz  Weight (kg) 103.1 kg 102.8 kg 101.1 kg      Telemetry/ECG  NSR- Personally Reviewed  Physical Exam  GEN: Well nourished, well developed in no acute distress HEENT: Normal NECK: No JVD; No carotid bruits LYMPHATICS: No lymphadenopathy CARDIAC:RRR, no murmurs, rubs, gallops RESPIRATORY: Crackles at bases bilaterally ABDOMEN: Soft, non-tender, non-distended MUSCULOSKELETAL: 1+ lower extremity pitting edema; No deformity  SKIN: Warm and dry NEUROLOGIC:  Alert and oriented x 3 PSYCHIATRIC:  Normal affect    Assessment & Plan  Lauren Clark is a 69 y.o. female with HFpEF, CKD 3B, paroxysmal AF, morbid obesity s/p gastric sleeve, prior DVT, HTN, HLD, TAA, hypothyroidism, nonobstructive CAD who presents with ADHF in the setting of flu A+ and newly reduced LVEF (55-60%->30-35%) consistent with possible stress-induced cardiomyopathy.     Septic shock -resolved UTI Flu A Multiple organ failure - resolved AKI Transaminitis On admission  there were concerns for septic shock, and leukocytosis.  The patient was also positive for flu A.  CT of the abdomen found a density in the left send the upper pole calyx. 06/30/2024 the patient underwent a cystoscopy and was found to have necrotic tissue in her bladder and kidney.  Cultures are pending.  Urology is considering getting a follow-up cystoscopy. She is currently on Zosyn  and Mycamine .  Because of concerns for a fungal UTI. Creatinine peaked at about 3.68.  Creatinine down to 1.65 Management per urology, primary, and ID.     Acute HFrEF Combined diastolic and systolic heart failure. TTE on 06/27/2024 showed a reduced LVEF of 30 to 35%, apical akinesis, G2 DD, elevated LAP, normal RV systolic function, moderate AR, and IVC with less than 50% respiratory variability. Previous TTE on 05/2024 showed a normal LVEF of 55 to 60%. Given overall clinical picture mentioned above was previously suspected that the acute decline in systolic function is stress cardiomyopathy. I's and O's 1.1L out yesterday and net -2.8 L since admission.  Most recent weight was 102.8 kgs.  On 06/04/2024 the patient weighed 97.5 kg's.  Difficult to assess volume status due to body habitus. GDMT   Plan to recheck echo in 2 to 3 months.  May consider ischemic evaluation at that time if LVEF remains reduced. Cxray yesterday with  worsening patchy opacities, weight trending up and has wheezing on exam consistent with volume overload so patient restarted on Lasix  40 mg IV twice daily Urine output 550 cc yesterday and net -3.4 L since admission; no significant change in weight from yesterday but down 10 pounds from admission  SCr 1.65 today, K+ 4.6 Despite serum creatinine trending upward she still appears volume overloaded Continue Lasix  40 mg IV twice daily Avoid SGLT2i in setting of fungal UTI Continue Toprol -XL 50 mg daily  consider addition of MRA and Entresto at d/c if BP and renal function allow     Elevated  high-sensitivity troponins 157 > 323 > 365 > 77 > 69 Suspect is secondary to demand ischemia from septic shock     Persistent A-fib with RVR S/p TEE/DCCV this admission Maintaining normal sinus rhythm on telemetry Continue Toprol -XL 50 mg daily  Continue amiodarone  200 mg twice daily  Continue apixaban  5 mg twice daily      Elevated LFTs ALT peaked at 2684.  They are downtrending. Management per primary     Otherwise management per primary    I spent 35 minutes caring for this patient today face to face, ordering and reviewing labs, reviewing records from 2D echo, Cxray 07/06/24 , seeing the patient, documenting in the record,  For questions or updates, please contact [facility] Please consult [email or web address] for contact info under        Signed, Wilbert Bihari, MD  8:11 AM  07/07/2024  "

## 2024-07-07 NOTE — Progress Notes (Signed)
 Patient ID: Lauren Clark, female   DOB: 1955-11-24, 69 y.o.   MRN: [id number]    Progress Note from the Palliative Medicine Team at [facility]   Patient Name: Lauren Clark        Date: 07/07/2024 DOB: 11/17/1955  Age: 69 y.o. MRN#: [id number] Attending Physician: Verdene Purchase, MD Primary Care Physician: [name], MD Admit Date: 06/26/2024   Reason for Consultation/Follow-up   Establishing Goals of Care   HPI/ Brief Hospital Review  69 y.o. female   admitted on 06/26/2024 with past medical history significant for morbid obesity status post gastric sleeve, history of DVT, A-fib, CHF, hypertension, thoracic aortic aneurysm, hypothyroidism, nonobstructive CAD,   vision impairment admitted for evaluation of elevated troponin.    Recent hospitalization 06/15/2024 to 06/16/2024 for AKI, pyelonephritis, mildly elevated LFTs, and acute on chronic hypoxic respiratory failure   Admitted for treatment and stabilization.  Patient with elevated LFTs, elevated troponin, COPD exacerbation in the setting of influenza A.  Unfortunately patient's respiratory status appears to be declining she is requiring greater oxygen needs to maintain O2 saturations.  She is on heated high flow at 40 L/min,  ongoing discussion regarding next steps in transition of care.     Patient faces treatment option decisions, advanced directive decisions and anticipatory care needs.   Subjective  Extensive chart review has been completed prior to meeting with patient/family  including labs, vital signs, imaging, progress/consult notes, orders, medications and available advance directive documents.   This NP assessed patient at the bedside as a follow up for palliaitve medicine needs and emotional support and  for ongoing conversation/education regarding current complex medical situation, and associated decisions specific to advanced care planning, treatment options and anticipatory care needs.    Patient is alert  and oriented, however this morning she is tired and difficult to have full conversation secondary to oxygen delivery systems. She verbalizes understanding that she has some decline over the past few days from a respiratory standpoint.  She tells me that she feels better this morning, and remains hopeful for improvement  She is hopefull for ongoing physical and functional  improvement.  Treat the treatable, open to SNF for short term rehab.      Spoke to  [employer] [place]/ HPOA.  I updated Olam on current medical situation, she understands the seriousness.  At this time patient is making her own decisions.  Olam will support her as allowed   Plan of Care: -DNR/DNI limited-patient is open to all offered and availble medical intervetnions to porolong life at this time -SNF for short term rehab -OP Palliative services at facility  -PMT to continue to support holistically    Education offered today regarding  the importance of continued conversation with family and their  medical providers regarding overall plan of care and treatment options,  ensuring decisions are within the context of the patients values and GOCs.  Questions and concerns addressed   Discussed with primary team and nursing staff   Time:  35   minutes  Detailed review of medical records ( labs, imaging, vital signs), medically appropriate exam ( MS, skin, cardiac,  resp)   discussed with treatment team, counseling and education to patient, family, staff, documenting clinical information, medication management, coordination of care    Ronal Plants NP  Palliative Medicine Team Team Phone # [phone] Pager [phone]

## 2024-07-07 NOTE — Plan of Care (Signed)
   Problem: Education: Goal: Knowledge of General Education information will improve Description Including pain rating scale, medication(s)/side effects and non-pharmacologic comfort measures Outcome: Progressing   Problem: Health Behavior/Discharge Planning: Goal: Ability to manage health-related needs will improve Outcome: Progressing

## 2024-07-07 NOTE — Progress Notes (Signed)
 Pt's provider notified that the pt was confused. Upon entering the room the pt started yelling & shouting that her charges were discharged & she's ready to go. Pt was alert to self & place as she told the RN her name, dob & that she was at [facility]. Pt got agitated when the RN asked her today's date stating she's not answering anymore questions as the nurse is just trying to make her confused. Pt slammed her drink yelling I need to get out of here. Pt's provider stated to continue monitoring the pt & an order was put in for haldol . Pt has been given dilaudid  twice as well as oxy once during this shift. Pt's oxygen was also low earlier in shift since the pt refused to keep her mask on. Will continue to monitor the pt & pass on to the night shift nurse. Rasheeda Mulvehill R, RN

## 2024-07-07 NOTE — Progress Notes (Signed)
 Patient is irritated and yelling at the staff. All of her needs have been addressed: pain medication administered, evening medication given, breathing treatments provided, she has been turned, and water  has been given. She is constantly using the call bell, asking for various things. No distress or issues noted at this time; will continue to monitor.

## 2024-07-08 ENCOUNTER — Encounter (HOSPITAL_COMMUNITY): Payer: Self-pay | Admitting: Cardiovascular Disease

## 2024-07-08 LAB — BASIC METABOLIC PANEL WITH GFR
Anion gap: 13 (ref 5–15)
BUN: 82 mg/dL — ABNORMAL HIGH (ref 8–23)
CO2: 25 mmol/L (ref 22–32)
Calcium: 8.9 mg/dL (ref 8.9–10.3)
Chloride: 100 mmol/L (ref 98–111)
Creatinine, Ser: 1.76 mg/dL — ABNORMAL HIGH (ref 0.44–1.00)
GFR, Estimated: 31 mL/min — ABNORMAL LOW
Glucose, Bld: 197 mg/dL — ABNORMAL HIGH (ref 70–99)
Potassium: 4.1 mmol/L (ref 3.5–5.1)
Sodium: 138 mmol/L (ref 135–145)

## 2024-07-08 LAB — CBC
HCT: 35.5 % — ABNORMAL LOW (ref 36.0–46.0)
Hemoglobin: 10.6 g/dL — ABNORMAL LOW (ref 12.0–15.0)
MCH: 29.6 pg (ref 26.0–34.0)
MCHC: 29.9 g/dL — ABNORMAL LOW (ref 30.0–36.0)
MCV: 99.2 fL (ref 80.0–100.0)
Platelets: 261 K/uL (ref 150–400)
RBC: 3.58 MIL/uL — ABNORMAL LOW (ref 3.87–5.11)
RDW: 18.4 % — ABNORMAL HIGH (ref 11.5–15.5)
WBC: 18.9 K/uL — ABNORMAL HIGH (ref 4.0–10.5)
nRBC: 1 % — ABNORMAL HIGH (ref 0.0–0.2)

## 2024-07-08 LAB — GLUCOSE, CAPILLARY
Glucose-Capillary: 152 mg/dL — ABNORMAL HIGH (ref 70–99)
Glucose-Capillary: 140 mg/dL — ABNORMAL HIGH (ref 70–99)
Glucose-Capillary: 138 mg/dL — ABNORMAL HIGH (ref 70–99)
Glucose-Capillary: 179 mg/dL — ABNORMAL HIGH (ref 70–99)

## 2024-07-08 MED ORDER — SODIUM CHLORIDE 0.9 % IV BOLUS
250.0000 mL | INTRAVENOUS | Status: AC
  Administered 2024-07-08: 250 mL via INTRAVENOUS

## 2024-07-08 MED ORDER — METOPROLOL SUCCINATE ER 25 MG PO TB24
25.0000 mg | ORAL_TABLET | Freq: Every day | ORAL | Status: DC
  Administered 2024-07-08: 25 mg via ORAL
  Filled 2024-07-08: qty 1

## 2024-07-08 MED ORDER — ALBUMIN HUMAN 25 % IV SOLN
25.0000 g | INTRAVENOUS | Status: AC
  Administered 2024-07-08: 25 g via INTRAVENOUS
  Filled 2024-07-08: qty 100

## 2024-07-08 MED ORDER — FUROSEMIDE 10 MG/ML IJ SOLN: 40.0000 mg | Freq: Every day | INTRAMUSCULAR | Status: DC

## 2024-07-08 MED ORDER — PIPERACILLIN-TAZOBACTAM 3.375 G IVPB
3.3750 g | Freq: Three times a day (TID) | INTRAVENOUS | Status: DC
  Administered 2024-07-08 – 2024-07-09 (×2): 3.375 g via INTRAVENOUS
  Filled 2024-07-08 (×3): qty 50

## 2024-07-08 MED ORDER — AMIODARONE HCL 200 MG PO TABS: 200.0000 mg | ORAL_TABLET | Freq: Every day | ORAL | Status: DC

## 2024-07-08 NOTE — Progress Notes (Signed)
 "  [facility] PROGRESS NOTE   Lauren Clark FMW:[id number] DOB: 15-Jun-1956 DOA: 06/26/2024  PCP: Lauren Clark DELENA, Lauren Clark  Brief History: 69 y.o. female with medical history significant of COPD, chronic hypoxemic respiratory failure on 2 L home oxygen, CAD, A-fib on Eliquis , HFpEF, ascending aortic aneurysm followed by cardiothoracic surgery, hypertension, hyperlipidemia, hypothyroidism, CKD stage IIIa, anemia, anxiety, depression, GERD, glaucoma, vision impairment, morbid obesity status post gastric sleeve surgery, PVD.  Recent hospital admission 12/13-12/24 for AKI, pyelonephritis, ileus, mildly elevated LFTs, and acute on chronic hypoxemic respiratory failure secondary to CHF exacerbation. Creatinine had peaked to 3.86.  Her diuretics were held and she was given IV albumin .  Creatinine improved to 2.97 at the time of discharge and nephrology resumed Lasix ..  Completed 7 days of Rocephin  for pyelonephritis.  She was evaluated by physical therapy and discharged with home health PT/OT afternoon.  Patient returned to the ED via EMS due to concern for shortness of breath, fall at home, and possible syncope. Admitted for acute on chronic hypoxic respiratory failure, Acute influenza PNA with superimposed bacterial PNA, acute COPD exacerbation, NSTEMI, acute CHF exacerbation, prolonged Qtc, Hypotension, acute liver failure, and acute on chronic kidney disease.   Consultants: Cardiology.  Infectious disease.  Urology.  Palliative care  Procedures:  Cystoscopy with cystolitholopaxy of 7 cm bladder collection/foreign body.  Ureteroscopy with laser destruction of lesion of upper and lower pole of left kidney with Dr. Cam on 12/28    Subjective/Interval History: Overnight events noted.  Patient also noted to be on heated high flow at 40 L/min.  Somewhat fatigued this morning.  Responds mentions that her mouth feels dry.  Denies any pain issues currently.       Assessment/Plan:  Acute on chronic  hypoxemic respiratory failure Acute influenza A infection Possible bacterial PNA COPD with acute exacerbation Patient was noted to be saturating in the 80s on her home 2 L [place] with EMS.   Patient was hospitalized and started on antibiotics and nebulizer treatments.  She completed 7-day course of ceftriaxone . Blood gas without evidence for hypercapnia. Dyspnea is likely multifactorial including COPD and CHF now with possibly added component of aspiration. Patient was started back on broad-spectrum antibiotics with vancomycin  and cefepime .  Augmentin  was discontinued.  Patient was to be on Augmentin  for Enterococcus faecalis on her urine cultures.  Vancomycin  should cover this. MRSA PCR was undetectable on 12/25.   WBC however noted to be higher which could be from the steroids. Continue with steroids.  Continue nebulizer treatments.  Remains on Tamiflu . Getting IV furosemide  per cardiology.   Due to concern for aspiration patient was seen by speech therapy.  They are worried about esophageal dysphagia.  However patient's respiratory status is is too tenuous for her to undergo any kind of diagnostic studies at this time.  Currently on D1 Diet. Patient's respiratory status appears to be declining.  She is currently on heated high flow at 40 L/min.  Palliative care continues to follow.  She is DNR/DNI.   Acute on chronic HFpEF Hypotension Echocardiogram does show LVEF of 30 to 35%.  Grade 2 diastolic dysfunction noted. Patient was given furosemide  once in the emergency department and then again on 12/26 but not continued due to AKI. Chest x-ray suggested worsening opacities in the lungs.  Could either be volume overload or new pneumonia, possibly aspiration. Seen by cardiology and started on IV furosemide .  Ins and outs and daily weights.  Monitor renal function closely.  Acute complicated UTI Bladder/kidney lesions Sepsis, present on admission Recently finished 7-day course of Rocephin  for  pyelonephritis. Prior UCx with polymicrobial growth at that time UA continues to show evidence of pyuria and bacteria. Ucx with E. Faecalis Blood culture without any growth. MRSA negative CT chest w/o contrast showed interval development of patchy ground-glass opacity with interlobular septal thickening in both upper lobes, L > R. Confluent areas of consolidative airspace disease are seen in a patchy distribution of the mid and lower lungs bilaterally. Imaging features are compatible with multifocal pneumonia. CT abdomen pelvis w/o contrast showed mild L hydroureteronephrosis without stone, high density material at upper pole calyx in L kidney that maybe blood product/infectious lesion/neoplasm, lamellated stone with internal gas in urinary bladder, cholelithiasis  Status post ureterocystoscopy w/ L retrograde pyelogram and Cystolitholapaxy 12/28 concerning for possible L renal and urinary bladder amorphous masses Pathology report of the left kidney wedge biopsy showed degenerated material with oxalate crystals and bacterial overgrowth.  Fungal elements were not identified.  Fungal ball did not show any fungal elements either. Patient has been on broad-spectrum antibiotics.  Subsequently seen by infectious disease.   Patient is currently noted to be on Augmentin .  Started back on micafungin . Mild hematuria noted.  Hemoglobin is stable though. CT scan was repeated on 12/31 which continues to show indeterminate soft tissue density filling defect within the upper pole collecting system of the left kidney.  This measures 2.5 x 1.7 x 1.7 cm.  Interval placement of the left-sided percutaneous nephro ureteral stent was noted with decompression of previous hydronephrosis.  A 1.1 cm left lower pole kidney stone was seen. Urology continues to follow they are considering a second look procedure sometime this week.  However patient's respiratory status has declined so this may not be possible anymore.   Okay to  discontinue Foley as per urology. Foley to be kept in since she is being diuresed.   Paroxysmal Atrial fibrillation Patient was on amiodarone  infusion.  Cardiology saw the patient.  Patient underwent DC cardioversion.  Remains in sinus rhythm.  Changed over to oral amiodarone .  Patient is on apixaban  as well.  Patient is also on metoprolol .   Elevated troponin NSTEMI History of CAD EKG without acute ischemic changes Minimally elevated troponin levels noted.  Cardiology is following.  No plans for catheterization.   Acute liver failure  Patient with significant elevation in AST ALT.  Possibly due to shock liver.   Hepatitis panel was unremarkable.  Acetaminophen  level was low.  Statin is on hold. She will need outpatient evaluation by general surgery for consideration of cholecystectomy after her acute pulmonary and cardiac issues resolve.  Continue to trend LFTs every so often.  Improvement noted over the past few days.   AKI on CKD stage IIIa High anion gap metabolic acidosis Baseline creatinine around 1.2.  Creatinine was 2.97 when she was discharged on 12/24.  Likely has higher baseline creatinine now. Continue to monitor renal function.  Creatinine was noted to be higher yesterday compared to the day before.  Labs are pending from today.   Chronic Intermittent Back pain Dengenerative disc disease Mechanical Fall No traumatic injuries identified on imaging of chest, pelvis, bilateral knees, left foot, head, and C-spine.  Fall precautions.  MRI L spine 12/29 showed Mild dextroscoliosis of the thoracolumbar spine, Grade 1 degenerative anterolisthesis at L3-4 and L4-5, Mild central spinal canal stenosis and moderate right neural foraminal stenosis at L4-5, Mild-to-moderate central spinal canal stenosis at L3-4, Mild central  spinal canal stenosis at T12-L1. Cont lidocaine  patch PT/OT following   Lactic acidosis Likely in the setting of hypotension due to possible shock state (septic vs  cardiogenic), NSTEMI and acute liver failure.  Resolved now.   QT prolongation QTc 531 on EKG.   Monitor electrolytes.  Avoid QT prolonging medications if possible.  Monitor on telemetry.   Hyperlipidemia Holding statin due to elevated LFTs.   Mild chronic hyponatremia Resolved   Hypothyroidism Continue levothyroxine    Anxiety and depression  Continue home fluoxetine    Obesity Estimated body mass index is 37.81 kg/m as calculated from the following:   Height as of this encounter: 5' 5 (1.651 m).   Weight as of this encounter: 103.1 kg.  Goals of care Patient is DNR.  Palliative care is following. Patient appears to have worsened in the last 2 to 3 days.  Prognosis is guarded.  DVT Prophylaxis: Apixaban  Code Status: DNR Family Communication: Discussed with patient Disposition Plan: SNF when medically stable     Medications: Scheduled:  acetylcysteine   4 mL Nebulization TID   amiodarone   200 mg Oral BID   apixaban   5 mg Oral BID   brimonidine   1 drop Right Eye BID   And   timolol   1 drop Right Eye BID   budesonide  (PULMICORT ) nebulizer solution  0.5 mg Nebulization BID   Chlorhexidine  Gluconate Cloth  6 each Topical Daily   diclofenac  Sodium  2 g Topical TID AC & HS   FLUoxetine   40 mg Oral Daily   furosemide   40 mg Intravenous BID   guaiFENesin   600 mg Oral BID   insulin  aspart  0-9 Units Subcutaneous TID WC   ipratropium-albuterol   3 mL Nebulization TID   latanoprost   1 drop Right Eye QHS   levothyroxine   112 mcg Oral q morning   lidocaine   1 patch Transdermal Q24H   methylPREDNISolone  (SOLU-MEDROL ) injection  40 mg Intravenous Q12H   metoprolol  succinate  25 mg Oral Daily   mouth rinse  15 mL Mouth Rinse 4 times per day   sodium bicarbonate   325 mg Oral BID   sodium chloride  flush  10-40 mL Intracatheter Q12H   Continuous:  ceFEPime  (MAXIPIME ) IV Stopped (07/08/24 0540)   micafungin  (MYCAMINE ) 100 mg in sodium chloride  0.9 % 100 mL IVPB 100 mg (07/08/24  0946)   vancomycin  1,250 mg (07/08/24 0918)   PRN:calcium  carbonate, HYDROmorphone  (DILAUDID ) injection, ipratropium-albuterol , mouth rinse, oxyCODONE , sodium chloride  flush, trimethobenzamide   Antibiotics: Anti-infectives (From admission, onward)    Start     Dose/Rate Route Frequency Ordered Stop   07/07/24 0800  vancomycin  (VANCOREADY) IVPB 1250 mg/250 mL        1,250 mg 166.7 mL/hr over 90 Minutes Intravenous Every 24 hours 07/06/24 0507     07/06/24 0600  vancomycin  (VANCOREADY) IVPB 2000 mg/400 mL        2,000 mg 200 mL/hr over 120 Minutes Intravenous  Once 07/06/24 0504 07/06/24 0743   07/06/24 0600  ceFEPIme  (MAXIPIME ) 2 g in sodium chloride  0.9 % 100 mL IVPB        2 g 200 mL/hr over 30 Minutes Intravenous Every 12 hours 07/06/24 0504     07/05/24 2200  oseltamivir  (TAMIFLU ) capsule 30 mg        30 mg Oral 2 times daily 07/05/24 1340 07/07/24 2159   07/04/24 1000  micafungin  (MYCAMINE ) 100 mg in sodium chloride  0.9 % 100 mL IVPB        100 mg 105  mL/hr over 1 Hours Intravenous Every 24 hours 07/03/24 1556     07/03/24 1015  amoxicillin -clavulanate (AUGMENTIN ) 875-125 MG per tablet 1 tablet  Status:  Discontinued        1 tablet Oral Every 12 hours 07/03/24 0922 07/06/24 1041   07/02/24 1200  amoxicillin -clavulanate (AUGMENTIN ) 500-125 MG per tablet 1 tablet  Status:  Discontinued        1 tablet Oral 2 times daily 07/02/24 1011 07/03/24 0922   07/02/24 1100  amoxicillin -clavulanate (AUGMENTIN ) 875-125 MG per tablet 1 tablet  Status:  Discontinued        1 tablet Oral Every 12 hours 07/02/24 1011 07/02/24 1012   07/01/24 1400  piperacillin -tazobactam (ZOSYN ) IVPB 3.375 g  Status:  Discontinued        3.375 g 12.5 mL/hr over 240 Minutes Intravenous Every 8 hours 07/01/24 1023 07/02/24 1011   06/30/24 1530  micafungin  (MYCAMINE ) 100 mg in sodium chloride  0.9 % 100 mL IVPB  Status:  Discontinued        100 mg 105 mL/hr over 1 Hours Intravenous Every 24 hours 06/30/24 1444  07/03/24 1144   06/29/24 1000  vancomycin  (VANCOCIN ) IVPB 1000 mg/200 mL premix  Status:  Discontinued        1,000 mg 200 mL/hr over 60 Minutes Intravenous Every 48 hours 06/27/24 0624 06/27/24 0924   06/28/24 2200  vancomycin  (VANCOCIN ) IVPB 1000 mg/200 mL premix        1,000 mg 200 mL/hr over 60 Minutes Intravenous  Once 06/28/24 1515 06/28/24 2235   06/28/24 1000  oseltamivir  (TAMIFLU ) capsule 30 mg  Status:  Discontinued        30 mg Oral Daily 06/28/24 0728 07/05/24 1340   06/27/24 1200  piperacillin -tazobactam (ZOSYN ) IVPB 3.375 g  Status:  Discontinued        3.375 g 12.5 mL/hr over 240 Minutes Intravenous Every 8 hours 06/27/24 0624 06/27/24 0924   06/27/24 1200  piperacillin -tazobactam (ZOSYN ) IVPB 2.25 g  Status:  Discontinued        2.25 g 100 mL/hr over 30 Minutes Intravenous Every 8 hours 06/27/24 0924 07/01/24 1023   06/27/24 0924  vancomycin  variable dose per unstable renal function (pharmacist dosing)  Status:  Discontinued         Does not apply See admin instructions 06/27/24 0924 06/29/24 0801   06/27/24 0500  oseltamivir  (TAMIFLU ) capsule 30 mg  Status:  Discontinued        30 mg Oral  Once 06/27/24 0425 06/28/24 0728   06/27/24 0045  piperacillin -tazobactam (ZOSYN ) IVPB 3.375 g        3.375 g 100 mL/hr over 30 Minutes Intravenous  Once 06/27/24 0036 06/27/24 0219   06/27/24 0045  vancomycin  (VANCOREADY) IVPB 1500 mg/300 mL        1,500 mg 150 mL/hr over 120 Minutes Intravenous  Once 06/27/24 0036 06/27/24 0401       Objective:  Vital Signs  Vitals:   07/08/24 0821 07/08/24 0822 07/08/24 0824 07/08/24 0849  BP:    (!) 136/56  Pulse:    (!) 57  Resp:    20  Temp:    (!) 97.5 F (36.4 C)  TempSrc:    Oral  SpO2: 93% 93% 94% 96%  Weight:      Height:        Intake/Output Summary (Last 24 hours) at 07/08/2024 1018 Last data filed at 07/08/2024 0848 Gross per 24 hour  Intake 1164.26 ml  Output 1250 ml  Net -85.74 ml   Filed Weights   07/06/24 0500  07/07/24 0318 07/08/24 0416  Weight: 102.8 kg 103.1 kg 103.1 kg   General appearance: Lethargic but easily arousable.  In no distress.  Appears to be in some discomfort. Resp: Tachypneic.  No use of accessory muscles.  Crackles bilateral bases.  No wheezing or rhonchi.  Cardio: S1-S2 is normal regular.  No S3-S4.  No rubs murmurs or bruit GI: Abdomen is soft.  Nontender nondistended.  Bowel sounds are present normal.  No masses organomegaly No focal neurological deficits.  Significant deconditioning is noted.  Lab Results:  Data Reviewed: I have personally reviewed following labs and reports of the imaging studies  CBC: Recent Labs  Lab 07/03/24 0552 07/04/24 0641 07/05/24 0500 07/06/24 0437 07/07/24 0355  WBC 15.8* 13.1* 17.2* 19.1* 16.1*  HGB 10.9* 11.4* 10.9* 11.3* 11.3*  HCT 35.2* 37.3 34.7* 38.1 36.2  MCV 96.2 96.4 95.9 99.2 97.1  PLT 193 159 217 276 242    Basic Metabolic Panel: Recent Labs  Lab 07/03/24 0552 07/04/24 0948 07/05/24 0500 07/06/24 0437 07/07/24 0355  NA 141 138 142 138 140  K 4.2 5.1 4.9 4.7 4.6  CL 104 105 105 98 101  CO2 26 20* 29 28 29   GLUCOSE 212* 202* 181* 253* 178*  BUN 70* 62* 60* 62* 71*  CREATININE 1.91* 1.37* 1.30* 1.45* 1.65*  CALCIUM  9.0 9.5 9.3 9.1 9.1    GFR: Estimated Creatinine Clearance: 38.3 mL/min (A) (by C-G formula based on SCr of 1.65 mg/dL (H)).  Liver Function Tests: Recent Labs  Lab 07/02/24 0920 07/03/24 0552 07/04/24 0948 07/05/24 0500 07/06/24 0437  AST 254* 182* 120* 98* 129*  ALT 746* 564* 409* 318* 303*  ALKPHOS 193* 194* 188* 185* 227*  BILITOT 0.8 0.7 0.9 0.7 0.9  PROT 5.9* 5.9* 6.1* 6.0* 6.1*  ALBUMIN  2.8* 2.8* 2.5* 2.9* 2.8*     BNP (last 3 results) Recent Labs    06/27/24 0002 06/27/24 0705  PROBNP 8,463.0* 32,843.0*    Recent Results (from the past 240 hours)  Fungus Culture With Stain     Status: None (Preliminary result)   Collection Time: 06/30/24 10:34 AM   Specimen: Path fluid;  Body Fluid  Result Value Ref Range Status   Fungus Stain Final report  Final    Comment: (NOTE) Performed At: [facility] [address] [place], [place] [id number] Jennette Shorter Lauren Clark Ey:[phone]    Fungus (Mycology) Culture PENDING  Incomplete   Fungal Source FLUID  Final    Comment: LEFT KIDNEY Performed at [facility], [address] [place], [place] [address]   Aerobic/Anaerobic Culture w Gram Stain (surgical/deep wound)     Status: None   Collection Time: 06/30/24 10:34 AM   Specimen: Path fluid; Body Fluid  Result Value Ref Range Status   Specimen Description FLUID LEFT KIDNEY  Final   Special Requests NONE  Final   Gram Stain RARE WBC SEEN NO ORGANISMS SEEN   Final   Culture   Final    No growth aerobically or anaerobically. Performed at [facility], [address], [place], [place] [address]    Report Status 07/05/2024 FINAL  Final  Acid Fast Smear (AFB)     Status: None   Collection Time: 06/30/24 10:34 AM   Specimen: Path fluid; Body Fluid  Result Value Ref Range Status   AFB Specimen Processing Concentration  Final   Acid Fast Smear Negative  Final    Comment: (NOTE) Performed At: [facility] [address] [place], [place] [id number] Jennette Shorter Lauren Clark Ey:[phone]    Source (AFB) FLUID  Final    Comment: LEFT KIDNEY Performed at [facility], [address] [place], [place] [address]   Fungus Culture Result     Status: None   Collection Time: 06/30/24 10:34 AM  Result Value Ref Range Status   Result 1 Comment  Final    Comment: (NOTE) KOH/Calcofluor preparation:  no fungus observed. Performed At: [facility] [address] [place], [place] [id number] Jennette Shorter Lauren Clark Ey:[phone]   Culture, blood (Routine X 2) w Reflex to ID Panel     Status: None (Preliminary result)   Collection Time: 07/06/24 10:47 AM   Specimen: BLOOD LEFT HAND  Result Value Ref Range Status   Specimen Description BLOOD LEFT  HAND  Final   Special Requests   Final    BOTTLES DRAWN AEROBIC ONLY Blood Culture results may not be optimal due to an inadequate volume of blood received in culture bottles   Culture   Final    NO GROWTH 2 DAYS Performed at [facility], [address], [place], [place] [address]    Report Status PENDING  Incomplete  Culture, blood (Routine X 2) w Reflex to ID Panel     Status: None (Preliminary result)   Collection Time: 07/06/24 10:48 AM   Specimen: BLOOD LEFT HAND  Result Value Ref Range Status   Specimen Description BLOOD LEFT HAND  Final   Special Requests   Final    BOTTLES DRAWN AEROBIC ONLY Blood Culture results may not be optimal due to an inadequate volume of blood received in culture bottles   Culture   Final    NO GROWTH 2 DAYS Performed at [facility], [address] [place], [place] [address]    Report Status PENDING  Incomplete      Radiology Studies: No results found.       LOS: 11 days   Ajahnae Rathgeber [employer] on [email or web address]  07/08/2024, 10:18 AM   "

## 2024-07-08 NOTE — Progress Notes (Signed)
 Speech Language Pathology Treatment: Dysphagia  Patient Details Name: Lauren Clark MRN: [id number] DOB: [birth year] Today's Date: 07/08/2024 Time: 8943-8886 SLP Time Calculation (min) (ACUTE ONLY): 17 min  Assessment / Plan / Recommendation Clinical Impression  Pt's diet was advanced to Dys 1 with thin liquids previous date. She denies ongoing regurgitation, though acknowledges use of strategies including limiting volumes, using a slow rate, and avoiding mixing liquids/solids. She is now on 40 LPM via HHFNC with SpO2 remaining >90% but increased WOB with repositioning and self-feeding. No s/s clinically concerning for aspiration were observed with thin liquids. No globus sensation or regurgitation was reported, even when attempting bites and sips.   PLAN: Continue current diet with meds in puree, which is consistent with her baseline with strict adherence to esophageal precautions. Pt reports feeling hungry but having limited assistance for feeding, which will likely be necessary to increase her intake. Otherwise, lower supplemental O2 requirements will be necessary before proceeding with esophageal assessment but is recommended when able. Will continue following.    HPI HPI: Pt is a 69 year old female admitted with acute influenza pna. MD concerned for possible bacterial pna as well as COPD exacerbation. Recent hospitalization for AKI, chronic respiratory failure due to CHF. Pt also has UTI, chronic heart failure and chronic back pain. No history of dysphagia in chart.      SLP Plan  Continue with current plan of care        Swallow Evaluation Recommendations   Recommendations: PO diet PO Diet Recommendation: Dysphagia 1 (Pureed);Thin liquids (Level 0) Liquid Administration via: Cup;Straw Medication Administration: Whole meds with puree Supervision: Full assist for feeding;Full supervision/cueing for swallowing strategies Postural changes: Position pt fully upright for meals;Stay upright  30-60 min after meals Oral care recommendations: Oral care BID (2x/day) Recommended consults: Consider esophageal assessment     Recommendations                     Oral care BID   Frequent or constant Supervision/Assistance Dysphagia, unspecified (R13.10)     Continue with current plan of care     Damien Blumenthal, M.A., CCC-SLP Speech Language Pathology, Acute Rehabilitation Services  Secure Chat preferred [phone]   07/08/2024, 11:50 AM

## 2024-07-08 NOTE — Plan of Care (Addendum)
 Patient blood pressure is soft systolic dropped 70 and improved to 100.  Currently MAP is 60 and -3.1 L in last 24 hours..  Recommended to hold IV Lasix  (currently receiving 40 mg twice daily entheses and Toprol -XL 50 mg systolic blood pressure below 889. -Giving to 250 mL of NS bolus and albumin  25 g.  Xzavier Swinger, MD [facility] 07/08/2024, 1:21 AM

## 2024-07-08 NOTE — Progress Notes (Signed)
 Physical Therapy Treatment Patient Details Name: Lauren Clark MRN: [id number] DOB: 06-May-1956 Today's Date: 07/08/2024   History of Present Illness 69 y.o. female d/c home from hospital 06/26/24 and readmitted same day due to worsening SOB, fall (pt recently admitted 06/15/24-06/26/24 with AKI, ileus, CHF). Workup for acute COPD exacerbation, influenza A, UTI. S/p cystoscopy and ureteroscopy w/ L uretal stent placement and retrieval of L renal mass and bladder mass on 12/28. S/p TEE/DCCV 1/2. Rapid response called 1/3 for worsening hypoxia; concern for PNA vs development of ARDS. PMH includes COPD (2L home O2), HTN, CAD, CKD, afib on Eliquis , HFpEF, aortic dissection, blind L eye, limited vision R eye, glaucoma, PVD, obesity, R THA.    PT Comments  Pt with increased fatigue and DOE this session; SpO2 89% on 40L O2 HHFNC at 75% FiO2 when pleth reliable. Pt requiring maxA for bed mobility, quick to fatigue with seated EOB activity requiring min-modA for balance. Pt remains limited by generalized weakness, decreased activity tolerance, and impaired balance strategies/postural reactions. Continue to recommend post-acute rehab (< 3 hrs/day) to maximize functional mobility and independence.     If plan is discharge home, recommend the following: A lot of help with walking and/or transfers;A lot of help with bathing/dressing/bathroom;Assistance with cooking/housework;Assist for transportation;Help with stairs or ramp for entrance   Can travel by private vehicle     No  Equipment Recommendations  TBD - potential Wheelchair (measurements PT);Wheelchair cushion (measurements PT);BSC/3in1;Hospital bed    Recommendations for Other Services       Precautions / Restrictions Precautions Precautions: Fall Recall of Precautions/Restrictions: Intact Precaution/Restrictions Comments: watch SpO2 (on 40L O2 HHFNC at 75% FiO2); blind L eye, limited vision R eye Restrictions Weight Bearing Restrictions Per  Provider Order: No     Mobility  Bed Mobility Overal bed mobility: Needs Assistance Bed Mobility: Supine to Sit, Sit to Supine     Supine to sit: Max assist, HOB elevated, Used rails Sit to supine: Max assist   General bed mobility comments: maxA for scooting hips to EOB and trunk elevation with HHA, increased time and effort, pt assisting with bed rail; maxA for BLE management return to supine, maxA to scoot up, modA to reposition hips    Transfers                   General transfer comment: unable to progress to standing or OOB transfers secondary to fatigue    Ambulation/Gait                   Stairs             Wheelchair Mobility     Tilt Bed    Modified Rankin (Stroke Patients Only)       Balance Overall balance assessment: Needs assistance Sitting-balance support: No upper extremity supported, Feet unsupported Sitting balance-Leahy Scale: Poor Sitting balance - Comments: reliant on UE support or min-modA external assist; very brief bouts of unsupported sitting with forward flexed posture                                    Communication Communication Communication: No apparent difficulties  Cognition Arousal: Alert Behavior During Therapy: WFL for tasks assessed/performed, Flat affect   PT - Cognitive impairments: No apparent impairments                       PT -  Cognition Comments: WFL for simple tasks; suspect flat affect and slowed responses at times related to this Following commands: Intact      Cueing Cueing Techniques: Verbal cues, Tactile cues, Visual cues  Exercises      General Comments General comments (skin integrity, edema, etc.): difficulty getting reliable pulse ox reading during session; when reliable pleth, SpO2 89% on 40L O2 HHFNC at 75% FiO2. educ pt on importance of sitting with HOB elevated and BUE/BLE AROM while supine      Pertinent Vitals/Pain Pain Assessment Pain Assessment:  Faces Faces Pain Scale: Hurts little more Pain Location: RUE, back, neck Pain Descriptors / Indicators: Discomfort, Grimacing, Sore Pain Intervention(s): Monitored during session, Limited activity within patient's tolerance, Repositioned    Home Living                          Prior Function            PT Goals (current goals can now be found in the care plan section) Acute Rehab PT Goals Patient Stated Goal: I want to go home PT Goal Formulation: With patient Time For Goal Achievement: 07/12/24 Potential to Achieve Goals: Fair Progress towards PT goals: Not progressing toward goals - comment (worsening fatigue and DOE)    Frequency    Min 2X/week      PT Plan      Co-evaluation              AM-PAC PT 6 Clicks Mobility   Outcome Measure  Help needed turning from your back to your side while in a flat bed without using bedrails?: A Lot Help needed moving from lying on your back to sitting on the side of a flat bed without using bedrails?: A Lot Help needed moving to and from a bed to a chair (including a wheelchair)?: A Lot Help needed standing up from a chair using your arms (e.g., wheelchair or bedside chair)?: A Lot Help needed to walk in hospital room?: Total Help needed climbing 3-5 steps with a railing? : Total 6 Click Score: 10    End of Session Equipment Utilized During Treatment: Oxygen Activity Tolerance: Patient limited by fatigue Patient left: in bed;with call bell/phone within reach;with bed alarm set Nurse Communication: Mobility status PT Visit Diagnosis: Unsteadiness on feet (R26.81);Other abnormalities of gait and mobility (R26.89);Muscle weakness (generalized) (M62.81)     Time: 1203-1223 PT Time Calculation (min) (ACUTE ONLY): 20 min  Charges:    $Therapeutic Activity: 8-22 mins PT General Charges $$ ACUTE PT VISIT: 1 Visit                     Darice Almas, PT, DPT Acute Rehabilitation Services  Personal: Secure  Chat Rehab Office: [phone]  Lauren Clark [name] 07/08/2024, 4:09 PM

## 2024-07-08 NOTE — TOC Progression Note (Signed)
 Transition of Care [facility]) - Progression Note    Patient Details  Name: Lauren Clark MRN: [id number] Date of Birth: July 23, 1955  [facility] CM/SW Contact  Isaiah Public, LCSWA Phone Number: 07/08/2024, 5:01 PM  Clinical Narrative:     TOC continues to follow. Palliative following. CSW following to start insurance authorization for SNF closer to patient being medically ready.  Expected Discharge Plan: Skilled Nursing Facility Barriers to Discharge: Continued Medical Work up               Expected Discharge Plan and Services       Living arrangements for the past 2 months: Apartment                                       Social Drivers of Health (SDOH) Interventions SDOH Screenings   Food Insecurity: Food Insecurity Present (06/27/2024)  Housing: Low Risk (06/27/2024)  Transportation Needs: Unmet Transportation Needs (06/27/2024)  Utilities: Not At Risk (06/27/2024)  Social Connections: Moderately Integrated (06/27/2024)  Tobacco Use: Low Risk (06/30/2024)    Readmission Risk Interventions     No data to display

## 2024-07-08 NOTE — Progress Notes (Signed)
 "        [facility]  Date of Admission:  06/26/2024     Reason for Consult: ?fungal ball in kidney/bladder                             Referring Provider: Cosette         Lines:  12/24-c urethral catheter 12/24-c left ureteral stent 12/24-c picc double lumen   Abx: 1/05-c piptazo 12/28-c micafungin   1/3-1/5 cefepime  3/3-1/5 vanc 12/30-1/3 augmentin    12/25-1/4 oseltamivir                                                       12/25-12/30 piptazo 12/25-27 vanc   Assessment: 69 yo female with copd on home o2, dm2 on jardiance, recent pyelo, readmitted with influenza/copd exacerbation and shock ?septic/cardiogenic (tte 30% ef), with ct imaging w/u for aki/transaminitis showing new left renal and bladder mass, concerning for fungal ball     Patient underwent on 12/28 cystoscopy and ureteroscopy with left ureteral stent placement and retrieval of the left renal mass and bladder mass. The bladder mass appear mostly necrotic and the left renal mass as well with some viable tissue   Frozen section (spoke with pathologist dr Macie Hummingbird) who reports no viable tissue on several cut of frozen section and she sees scattered cocci in the mass. The bacteria likely bystanders and unsual would expect gnr     Patient doesn't have risk factors for fungal infection outside of candida (she is on jardiance; but didn't have dka). Mucor/aspergillus would be extremely rare   Ucx e feacalis appear to be what is seen in the bladder mass frozen section. Not entirely clear if superimposed bacterial infection at this time but will keep on abx for now    Given her severe presentation although the shock could be cardiogenic from possible influenza related myocarditis vs sepsis in general, can't r/o septic process from renal yet, and will cover for gu infection       Epidemiologically speaking, micafungin  would penetrate the kidney but not bladder (however bladder mass  removed), and would use this for presumed candida pyelonephritis. Would also continue empiric piptazo for now.    Bcx on admission has been negative, but if candida invasive disease / bsi could grow in around 5 days     Also has lower back pain that is rather severe. Will see if mri suggestive of any pyogenic process     Influenza/copd exacerbation seems to be improving with supportive care from primary team. Chest ct did suggest a viral pneumonitis vs some fluid component from cardiogenic shock   Shock associated lft elevation also improving   Micro: 12/28 renal/bladder  Bacterial cx - in process Afb cx - in process Fungal cx - in process   ------------------- 07/02/24 id assessment Mri lumbar spine no sign of infection Lft improving Still on supplemental o2 and tenuous Back pain stable  No fever  Cardiology following for chf and afib/rvr. On amiodarone    Query if brief uptick in wbc due to delayed effect of prednisone  (last dose 12/29)   --------- 12/31 id assessment Ct abd pelv repeat -- no official read. Discussed with dr Cam of urology still rather full of ?infectious material burden.  12/28 pathology A. KIDNEY, LEFT, WEDGE BIOPSY:  - Degenerated material with oxalate crystals and bacterial overgrowth  - Fungal elements are not identified   B. FUNGAL BALL:  - Degenerated material with bacterial overgrowth  - Fungal elements are not identified   I discussed with dr Cam patient stable/improving. Unclear at this time from my standpoint what the renal/cystic process is but certainly doesn't appear to be mucor. Most likely candida although pathology doesn't show any yeast/fungal element and urine cx ngtd. She was on jardiance and is diabetic and I am not aware of any bacterial process that present like this  ?noninfectious process rather unlikely given how quickly it appears   No urgency from id standpoint for repeat debridement which urology plans  potentially next week  12/28 left renal/bladder sampling fungal cx, afb cx in progress; bacterial cx ngtd 12/25 ucx e faecalis   ------- 07/08/24 id assessment Increased o2 since 07/05/2024 to hfnc; Patient thought to have aspirated over the weekend and started on HAP tx. However no fever or worsening leukocytosis  1/3 did mention worsening pulm nodules   According to I&O she is about net even for this admission   Finished oseltamvir flu course  1/2 tee done showed improved lv function no significant valvular regurgitation. No intramural thrombus  Her renal function also improved to 1.3 on 07/06/24 but worsrening again. Bun rising appears to have a prerenal picture. Makes me worry, in setting normal tee, about the pulmnonary process (taken in consideration of the renal imaging finding)   12/28 renal biopsy and bladder mass bx seems to be of mostly necrotic sampling. Would like to scan chest ct and have discussed with urology for second look   Still rather confusing if this was all infectious. Will send fungitel level to see and crypto and histo/blasto     Plan: Change hap abx to piptazo and continue micafungin  Stop vanc/cefepime  Chest ct noncontrast tomorrow Crypto, fungitel, asp gm, histo/blasto Await urology repeat endoscopy -- please send A) pathology of viable renal parenchyma B) fungal culture C) afb culture C) aerobic/anaerobic cx Maintain droplet precaution per IP protocol Appreciate urology and triad care for patient  Principal Problem:   Acute on chronic respiratory failure with hypoxemia (HCC) Active Problems:   AKI (acute kidney injury)   Pyelonephritis   COPD with acute exacerbation (HCC)   Elevated troponin   UTI (urinary tract infection)   Acute HFrEF (heart failure with reduced ejection fraction) (HCC)   HFrEF (heart failure with reduced ejection fraction) (HCC)   Pressure injury of skin   Influenza A   Severe sepsis with septic shock (HCC)   Takotsubo  cardiomyopathy   Persistent atrial fibrillation (HCC)   Transaminitis   Allergies[1]  Scheduled Meds:  acetylcysteine   4 mL Nebulization TID   [START ON Aug 08, 2024] amiodarone   200 mg Oral Daily   apixaban   5 mg Oral BID   brimonidine   1 drop Right Eye BID   And   timolol   1 drop Right Eye BID   budesonide  (PULMICORT ) nebulizer solution  0.5 mg Nebulization BID   Chlorhexidine  Gluconate Cloth  6 each Topical Daily   diclofenac  Sodium  2 g Topical TID AC & HS   FLUoxetine   40 mg Oral Daily   furosemide   40 mg Intravenous Daily   guaiFENesin   600 mg Oral BID   insulin  aspart  0-9 Units Subcutaneous TID WC   ipratropium-albuterol   3 mL Nebulization TID   latanoprost   1 drop Right  Eye QHS   levothyroxine   112 mcg Oral q morning   lidocaine   1 patch Transdermal Q24H   methylPREDNISolone  (SOLU-MEDROL ) injection  40 mg Intravenous Q12H   metoprolol  succinate  25 mg Oral Daily   mouth rinse  15 mL Mouth Rinse 4 times per day   sodium bicarbonate   325 mg Oral BID   sodium chloride  flush  10-40 mL Intracatheter Q12H   Continuous Infusions:  micafungin  (MYCAMINE ) 100 mg in sodium chloride  0.9 % 100 mL IVPB Stopped (07/08/24 1050)   piperacillin -tazobactam (ZOSYN )  IV 3.375 g (07/08/24 1840)   PRN Meds:.calcium  carbonate, HYDROmorphone  (DILAUDID ) injection, ipratropium-albuterol , mouth rinse, oxyCODONE , sodium chloride  flush, trimethobenzamide    SUBJECTIVE: Respiratory status tenuous on hfnc Abx broadened for hap given this rise in o2 requirement over the weekend Afebrile Cxr does suggest increased bilateral pulm opacities      Review of Systems: ROS All other ROS was negative, except mentioned above     OBJECTIVE: Vitals:   07/08/24 1618 07/08/24 1708 07/08/24 1709 07/08/24 1957  BP:  (!) 104/59    Pulse:  (!) 54 (!) 52 (!) 54  Resp:  18 19 19   Temp: 97.8 F (36.6 C)   98.2 F (36.8 C)  TempSrc: Oral   Oral  SpO2:  93% 91% 95%  Weight:      Height:       Body  mass index is 37.81 kg/m.  Physical Exam  General/constitutional: slightly tachypneic; conversant; on supplemental o2 HEENT: Normocephalic, PER, Conj Clear, EOMI, Oropharynx clear Neck supple CV: rrr no mrg Lungs: exp wheeze Abd: Soft, Nontender Ext: no edema Skin: No Rash Neuro: nonfocal MSK: no peripheral joint swelling/tenderness/warmth; back spines nontender  Lab Results Lab Results  Component Value Date   WBC 18.9 (H) 07/08/2024   HGB 10.6 (L) 07/08/2024   HCT 35.5 (L) 07/08/2024   MCV 99.2 07/08/2024   PLT 261 07/08/2024    Lab Results  Component Value Date   CREATININE 1.76 (H) 07/08/2024   BUN 82 (H) 07/08/2024   NA 138 07/08/2024   K 4.1 07/08/2024   CL 100 07/08/2024   CO2 25 07/08/2024    Lab Results  Component Value Date   ALT 303 (H) 07/06/2024   AST 129 (H) 07/06/2024   ALKPHOS 227 (H) 07/06/2024   BILITOT 0.9 07/06/2024      Microbiology: Recent Results (from the past 240 hours)  Fungus Culture With Stain     Status: None (Preliminary result)   Collection Time: 06/30/24 10:34 AM   Specimen: Path fluid; Body Fluid  Result Value Ref Range Status   Fungus Stain Final report  Final    Comment: (NOTE) Performed At: [facility] [address] [place], [place] [id number] Jennette Shorter MD Ey:[id number]    Fungus (Mycology) Culture PENDING  Incomplete   Fungal Source FLUID  Final    Comment: LEFT KIDNEY Performed at [facility], [address], [place], [place] [address]   Aerobic/Anaerobic Culture w Gram Stain (surgical/deep wound)     Status: None   Collection Time: 06/30/24 10:34 AM   Specimen: Path fluid; Body Fluid  Result Value Ref Range Status   Specimen Description FLUID LEFT KIDNEY  Final   Special Requests NONE  Final   Gram Stain RARE WBC SEEN NO ORGANISMS SEEN   Final   Culture   Final    No growth aerobically or anaerobically. Performed at [facility], [address], [place],  [place] [address]     Report Status 07/05/2024 FINAL  Final  Acid Fast Smear (AFB)     Status: None   Collection Time: 06/30/24 10:34 AM   Specimen: Path fluid; Body Fluid  Result Value Ref Range Status   AFB Specimen Processing Concentration  Final   Acid Fast Smear Negative  Final    Comment: (NOTE) Performed At: [facility] [address] [place], [place] [id number] Jennette Shorter MD Ey:[id number]    Source (AFB) FLUID  Final    Comment: LEFT KIDNEY Performed at [facility], [address], [place], [place] [address]   Fungus Culture Result     Status: None   Collection Time: 06/30/24 10:34 AM  Result Value Ref Range Status   Result 1 Comment  Final    Comment: (NOTE) KOH/Calcofluor preparation:  no fungus observed. Performed At: [facility] [address] [place], [place] [id number] Jennette Shorter MD Ey:[id number]   Culture, blood (Routine X 2) w Reflex to ID Panel     Status: None (Preliminary result)   Collection Time: 07/06/24 10:47 AM   Specimen: BLOOD LEFT HAND  Result Value Ref Range Status   Specimen Description BLOOD LEFT HAND  Final   Special Requests   Final    BOTTLES DRAWN AEROBIC ONLY Blood Culture results may not be optimal due to an inadequate volume of blood received in culture bottles   Culture   Final    NO GROWTH 2 DAYS Performed at [facility], [address] [place], [place] [address]    Report Status PENDING  Incomplete  Culture, blood (Routine X 2) w Reflex to ID Panel     Status: None (Preliminary result)   Collection Time: 07/06/24 10:48 AM   Specimen: BLOOD LEFT HAND  Result Value Ref Range Status   Specimen Description BLOOD LEFT HAND  Final   Special Requests   Final    BOTTLES DRAWN AEROBIC ONLY Blood Culture results may not be optimal due to an inadequate volume of blood received in culture bottles   Culture   Final    NO GROWTH 2 DAYS Performed at [facility], [address], [place], [place] [address]    Report  Status PENDING  Incomplete     Serology:    Studies: 12/28 renal/bladder biopsy A. KIDNEY, LEFT, WEDGE BIOPSY:  - Degenerated material with oxalate crystals and bacterial overgrowth  - Fungal elements are not identified   B. FUNGAL BALL:  - Degenerated material with bacterial overgrowth  - Fungal elements are not identified    Imaging: If present, new imagings (plain films, ct scans, and mri) have been personally visualized and interpreted; radiology reports have been reviewed. Decision making incorporated into the Impression / Recommendations.      12/30 mri lumbar spine 1. Mild dextroscoliosis of the thoracolumbar spine. 2. Grade 1 degenerative anterolisthesis at L3-4 and L4-5. 3. Mild central spinal canal stenosis and moderate right neural foraminal stenosis at L4-5. 4. Mild-to-moderate central spinal canal stenosis at L3-4. 5. Mild central spinal canal stenosis at T12-L1.  12/31 ct abd pelv in progress 1. Indeterminate soft tissue density filling defect within the upper pole collecting system of the left kidney persists, measuring 2.5 x 1.7 x 1.7 cm (42 HU on unenhanced CT), with differential including fungal ball, blood clot, and urothelial neoplasm. Recommend further evaluation with contrast-enhanced CT urogram or renal protocol MRI, and urology consultation. 2. Interval placement of left-sided percutaneous nephroureteral  stent with decompression of previous hydronephrosis, with a 1.1 cm lower pole left renal stone. 3. Bilateral progressive airspace opacities within both lower lobes and the right middle lobe, concerning for worsening multifocal infection.   1/3 cxr 1. Worsening bilateral patchy airspace opacities and bilateral pulmonary nodules. 2. Right upper extremity PICC with tip at the superior cavoatrial junction. 3. Cardiomegaly.   Constance ONEIDA Passer, MD [facility] [phone] pager    07/08/2024, 8:15  PM       [1]  Allergies Allergen Reactions   Crestor  [Rosuvastatin  Calcium ] Other (See Comments)    Muscle aches     Hctz [Hydrochlorothiazide] Other (See Comments)    Pt doesn't remember   Lipitor [Atorvastatin] Other (See Comments)    Muscle aches    "

## 2024-07-08 NOTE — Progress Notes (Addendum)
 "  Progress Note  Patient Name: Lauren Clark Date of Encounter: 07/08/2024 [facility] Cardiologist: Vinie JAYSON Maxcy, MD   Interval Summary    Overnight hypotensive, MAP <65,  received albumin  and 250 ml NS.  BP improved this morning. HDS.  No acute concerns. Morning labs yet to be collected.  Vital Signs Vitals:   07/08/24 0416 07/08/24 0821 07/08/24 0822 07/08/24 0824  BP: 125/62     Pulse: (!) 53     Resp: 18     Temp: 97.6 F (36.4 C)     TempSrc: Oral     SpO2: 90% 93% 93% 94%  Weight: 103.1 kg     Height:        Intake/Output Summary (Last 24 hours) at 07/08/2024 0850 Last data filed at 07/08/2024 0848 Gross per 24 hour  Intake 1164.26 ml  Output 1250 ml  Net -85.74 ml      07/08/2024    4:16 AM 07/07/2024    3:18 AM 07/06/2024    5:00 AM  Last 3 Weights  Weight (lbs) 227 lb 3.2 oz 227 lb 4.6 oz 226 lb 10.1 oz  Weight (kg) 103.057 kg 103.098 kg 102.8 kg      Telemetry/ECG  NSR- Personally Reviewed  Physical Exam  GEN: Well nourished, well developed in no acute distress HEENT: Normal NECK: No JVD; No carotid bruits LYMPHATICS: No lymphadenopathy CARDIAC:RRR, no murmurs, rubs, gallops RESPIRATORY: Crackles at bases bilaterally ABDOMEN: Soft, non-tender, non-distended MUSCULOSKELETAL: 1+ lower extremity pitting edema; No deformity  SKIN: Warm and dry NEUROLOGIC:  Alert and oriented x 3 PSYCHIATRIC:  Normal affect    Assessment & Plan  MIELA DESJARDIN is a 69 y.o. female with HFpEF, CKD 3B, paroxysmal AF, morbid obesity s/p gastric sleeve, prior DVT, HTN, HLD, TAA, hypothyroidism, nonobstructive CAD who presents with ADHF in the setting of flu A+ and newly reduced LVEF (55-60%->30-35%) consistent with possible stress-induced cardiomyopathy.     Septic shock -resolved UTI Flu A Multiple organ failure - resolved AKI Transaminitis On admission there were concerns for septic shock, and leukocytosis.  The patient was also positive for flu A.  CT of the  abdomen found a density in the left send the upper pole calyx. 06/30/2024 the patient underwent a cystoscopy and was found to have necrotic tissue in her bladder and kidney.  Cultures are pending.  Urology is considering getting a follow-up cystoscopy.  She is currently on Zosyn  and Mycamine  due to concern for a fungal UTI. Scr stable at 1.73 Management per urology, primary, and ID.   Acute HFrEF Combined diastolic and systolic heart failure. TTE on 06/27/2024 showed a reduced LVEF of 30 to 35%, apical akinesis, G2 DD, elevated LAP, normal RV systolic function, moderate AR, and IVC with less than 50% respiratory variability. Previous TTE on 05/2024 showed a normal LVEF of 55 to 60%. Given overall clinical picture mentioned above was previously suspected that the acute decline in systolic function is stress cardiomyopathy.  UOP 1.3L with IV Lasix  40 mg twice daily; complicated by hypotension. - BMP with mild SCr uptrend, K stable.  Will decrease IV Lasix  to 40 mg daily. - Decrease Toprol  XL from 50 mg to 25 mg due to bradycardia. -  Plan to recheck echo in 2 to 3 months.  May consider ischemic evaluation at that time if LVEF remains reduced. -  Avoid SGLT2i in setting of fungal UTI - Could consider addition of MRA and Entresto at d/c if BP  and renal function allow   Elevated high-sensitivity troponins 157 > 323 > 365 > 77 > 69 Suspect is secondary to demand ischemia from septic shock   Persistent A-fib with RVR S/p TEE/DCCV this admission - Maintaining normal sinus rhythm on telemetry - Toprol  XL dose decreased to 25 mg due to bradycardia as above. - continue amiodarone  200 mg twice daily  - Continue apixaban  5 mg twice daily    Elevated LFTs ALT peaked at 2684.  They are downtrending. Management per primary    Otherwise management per primary  For questions or updates, please contact [facility] Please consult [email or web address] for contact info under   Signed, Missy Sandhoff, MD  8:50 AM  07/08/2024  "

## 2024-07-09 DIAGNOSIS — I480 Paroxysmal atrial fibrillation: Secondary | ICD-10-CM

## 2024-07-09 DIAGNOSIS — R0609 Other forms of dyspnea: Secondary | ICD-10-CM

## 2024-07-09 LAB — CBC
HCT: 33.7 % — ABNORMAL LOW (ref 36.0–46.0)
Hemoglobin: 10.4 g/dL — ABNORMAL LOW (ref 12.0–15.0)
MCH: 29.7 pg (ref 26.0–34.0)
MCHC: 30.9 g/dL (ref 30.0–36.0)
MCV: 96.3 fL (ref 80.0–100.0)
Platelets: 270 K/uL (ref 150–400)
RBC: 3.5 MIL/uL — ABNORMAL LOW (ref 3.87–5.11)
RDW: 18.9 % — ABNORMAL HIGH (ref 11.5–15.5)
WBC: 17.5 K/uL — ABNORMAL HIGH (ref 4.0–10.5)
nRBC: 0.6 % — ABNORMAL HIGH (ref 0.0–0.2)

## 2024-07-09 LAB — CRYPTOCOCCAL ANTIGEN: Crypto Ag: NEGATIVE

## 2024-07-09 LAB — BASIC METABOLIC PANEL WITH GFR
Anion gap: 13 (ref 5–15)
BUN: 81 mg/dL — ABNORMAL HIGH (ref 8–23)
CO2: 26 mmol/L (ref 22–32)
Calcium: 9.1 mg/dL (ref 8.9–10.3)
Chloride: 102 mmol/L (ref 98–111)
Creatinine, Ser: 1.77 mg/dL — ABNORMAL HIGH (ref 0.44–1.00)
GFR, Estimated: 31 mL/min — ABNORMAL LOW
Glucose, Bld: 154 mg/dL — ABNORMAL HIGH (ref 70–99)
Potassium: 4.1 mmol/L (ref 3.5–5.1)
Sodium: 140 mmol/L (ref 135–145)

## 2024-07-09 MED ORDER — HYDROMORPHONE HCL 1 MG/ML IJ SOLN
INTRAMUSCULAR | Status: AC
  Filled 2024-07-09: qty 1

## 2024-07-09 MED ORDER — LORAZEPAM 2 MG/ML IJ SOLN
1.0000 mg | INTRAMUSCULAR | Status: DC | PRN
  Administered 2024-07-09: 1 mg via INTRAVENOUS
  Filled 2024-07-09: qty 1

## 2024-07-09 MED ORDER — HYDROMORPHONE HCL 1 MG/ML IJ SOLN
1.0000 mg | INTRAMUSCULAR | Status: DC | PRN
  Administered 2024-07-09: 1 mg via INTRAVENOUS

## 2024-07-09 MED ORDER — HYDROMORPHONE HCL-NACL 50-0.9 MG/50ML-% IV SOLN
1.0000 mg/h | INTRAVENOUS | Status: DC
  Administered 2024-07-09: 1 mg/h via INTRAVENOUS
  Filled 2024-07-09 (×2): qty 50

## 2024-07-09 MED ORDER — SODIUM CHLORIDE 0.9 % IV SOLN: INTRAVENOUS | Status: DC | PRN

## 2024-07-11 LAB — CULTURE, BLOOD (ROUTINE X 2)
Culture: NO GROWTH
Culture: NO GROWTH

## 2024-07-12 LAB — FUNGITELL BETA-D-GLUCAN: Fungitell Value:: 40.38 pg/mL

## 2024-07-12 LAB — HISTOPLASMA ANTIBODIES, QN,DID: Histoplasma Ab, Immunodiffusion: NEGATIVE

## 2024-07-13 LAB — BLASTOMYCES ANTIGEN
Blastomyces Antigen: NOT DETECTED ng/mL
Interpretation: NEGATIVE

## 2024-07-31 ENCOUNTER — Ambulatory Visit: Admitting: Surgery

## 2024-08-02 LAB — FUNGUS CULTURE WITH STAIN

## 2024-08-02 LAB — FUNGAL ORGANISM REFLEX

## 2024-08-02 LAB — FUNGUS CULTURE RESULT

## 2024-08-04 NOTE — Progress Notes (Signed)
 Patient calling out help and removing her heated high flow oxygen.   RN and night shift RN to bedside to assist patient.  Patient very tearful and was repeatedly saying I want to end this, I want to go, I don't want to do this anymore.   RN discussed with patient that if she wished, there were comfort measures that could be put in place to allow her to pass with comfort and dignity but at this time these measures were not in place and when patient removes her oxygen, her oxygen levels decrease and patient would be in discomfort.    Patient agreed to place oxygen back on at this time and await MD to round to discuss plan of care and any adjustments per patient's wishes.   RN sent secure chat to MD Verdene and NP Cherisse with Palliative team.

## 2024-08-04 NOTE — Plan of Care (Signed)

## 2024-08-04 NOTE — Progress Notes (Signed)
"  °  Progress Note  Patient Name: [name] Date of Encounter: 08-Aug-2024 [facility] Cardiologist: Vinie JAYSON Maxcy, MD   Interval Summary    No acute event overnight.   HR 52-68, intermittent softer BP, MAP >65.  This AM, palliative consulted for worsening respiratory status after patient stated, I want to end this; I dont want this anymore. Transitioned to comfort care.  Seen at bedside, resting comfortably.  Vital Signs Vitals:   07/08/24 2322 08/08/2024 0357 2024-08-08 0707 2024-08-08 0709  BP: 106/62 (!) 98/43    Pulse: (!) 53 (!) 51 (!) 54 (!) 56  Resp: (!) 21 15 (!) 21 17  Temp: (!) 97.5 F (36.4 C) 97.6 F (36.4 C)    TempSrc: Axillary Oral    SpO2: 92% 97% (!) 71% 90%  Weight:  102.1 kg    Height:       Labs: WBC 18.9 > 17.5 BMP SCR 1.77 UOP 1.3 L Crypto antigen negative Histoplasma antibodies pending.   UOP  Intake/Output Summary (Last 24 hours) at Aug 08, 2024 0748 Last data filed at Aug 08, 2024 0700 Gross per 24 hour  Intake 745 ml  Output 1275 ml  Net -530 ml      08-08-2024    3:57 AM 07/08/2024    4:16 AM 07/07/2024    3:18 AM  Last 3 Weights  Weight (lbs) 225 lb 1.4 oz 227 lb 3.2 oz 227 lb 4.6 oz  Weight (kg) 102.1 kg 103.057 kg 103.098 kg      Telemetry/ECG  NSR- Personally Reviewed  Physical Exam  GEN: Well nourished, well developed in no acute distress HEENT: Normal NECK: No JVD; No carotid bruits LYMPHATICS: No lymphadenopathy CARDIAC:RRR, no murmurs, rubs, gallops RESPIRATORY: Crackles at bases bilaterally ABDOMEN: Soft, non-tender, non-distended MUSCULOSKELETAL: 1+ lower extremity pitting edema; No deformity  SKIN: Warm and dry NEUROLOGIC:  Alert and oriented x 3 PSYCHIATRIC:  Normal affect    Assessment & Plan  Lauren Clark is a 69 y.o. female with HFpEF, CKD 3B, paroxysmal AF, morbid obesity s/p gastric sleeve, prior DVT, HTN, HLD, TAA, hypothyroidism, nonobstructive CAD who presents with ADHF in the setting of flu A+ and newly  reduced LVEF (55-60%->30-35%) consistent with possible stress-induced cardiomyopathy.   Septic shock -resolved UTI Flu A Multiple organ failure - resolved AKI Transaminitis - On comfort care as below.  Acute HFrEF Combined diastolic and systolic heart failure. - Discontinued all medicines; transitioned to comfort care.  Persistent A-fib with RVR S/p TEE/DCCV this admission. - On comfort care as above.  Cardiology will sign off.  For questions or updates, please contact [facility] Please consult [email or web address] for contact info under   Signed, Daneya Hartgrove, M.D.  Internal Medicine Resident, PGY-2  "

## 2024-08-04 NOTE — Progress Notes (Signed)
 Heart Failure Navigator Progress Note  Assessed for Heart & Vascular TOC clinic readiness.  Patient does not meet criteria due to patient has been transitioned to comfort care. No HF TOC. .   Navigator will sign off at this time.   Stephane Haddock, BSN, Scientist, Clinical (histocompatibility And Immunogenetics) Only

## 2024-08-04 NOTE — Progress Notes (Signed)
 Patient ID: Lauren Clark, female   DOB: 1955-11-18, 69 y.o.   MRN: [id number]    Progress Note from the Palliative Medicine Team at [facility]   Patient Name: Lauren Clark        Date: 07-20-2024 DOB: 16-Dec-1955  Age: 69 y.o. MRN#: [id number] Attending Physician: Lauren Purchase, MD Primary Care Physician: Lauren Leni Edyth DELENA, MD Admit Date: 06/26/2024   Reason for Consultation/Follow-up   Establishing Goals of Care   HPI/ Brief Hospital Review  69 y.o. female   admitted on 06/26/2024 with past medical history significant for morbid obesity status post gastric sleeve, history of DVT, A-fib, CHF, hypertension, thoracic aortic aneurysm, hypothyroidism, nonobstructive CAD,   vision impairment admitted for evaluation of elevated troponin.    Recent hospitalization 06/15/2024 to 06/16/2024 for AKI, pyelonephritis, mildly elevated LFTs, and acute on chronic hypoxic respiratory failure   Admitted for treatment and stabilization.  Patient with elevated LFTs, elevated troponin, COPD exacerbation in the setting of influenza A.  Unfortunately patient's respiratory status appears to be declining she is requiring greater oxygen needs to maintain O2 saturations.  She is on heated high flow at 40 L/min,  ongoing discussion regarding next steps in transition of care.     Patient faces treatment option decisions, advanced directive decisions and anticipatory care needs.   Subjective  Extensive chart review has been completed prior to meeting with patient/family  including labs, vital signs, imaging, progress/consult notes, orders, medications and available advance directive documents.   This NP contacted by floor nurse that patient is expresses her desire for a more comfort path.  This NP assessed patient at the bedside as a follow up for palliaitve medicine needs and emotional support.  Patient is alert and oriented, she is somewhat uncomfortable secondary to tachypnea, and high flow oxygen  delivery system is uncomfortable for her  Lauren Clark is verbalizing   she is tired and ready to die,   I don't want any of this, I can't do it anymore.  Occasion offered on a shift to full comfort, foregoing life-prolonging measures, focusing on comfort and dignity allowing for natural death.  Yes that is what I want         Spoke to  Lauren Clark/ HPOA.  I updated Lauren Clark on current medical situation, she understands the seriousness.  Lauren Clark supports patient's decision to shift to full comfort  Plan of Care: -DNR/DNI comfort - Dilaudid  drip, with titration and boluses--for dyspnea and pain - Ativan  as prescribed for anxiety and agitation - No further diagnostics, labs, tests, CBGs - Comfort and dignity or focus of care--allowing for natural death - Prognosis is likely hours--expect hospital death -PMT to continue to support holistically   I stayed with patient offering emotional support.  Massage to hand and forehead until comfortable    Discussed with primary team and nursing staff   Time:  50   minutes  Detailed review of medical records ( labs, imaging, vital signs), medically appropriate exam ( MS, skin, cardiac,  resp)   discussed with treatment team, counseling and education to patient, family, staff, documenting clinical information, medication management, coordination of care    Lauren Plants NP  Palliative Medicine Team Team Phone # [phone] Pager [phone]

## 2024-08-04 NOTE — Progress Notes (Signed)
 30 mL of Hydromorphine infusion wasted, witnessed by Randine Lobstein RN.

## 2024-08-04 NOTE — Progress Notes (Signed)
 Patient's HCPOA Lauren Clark visting patient bedside.  Patient's belongings were brought home by patient's HCPOA Lauren Clark.  Belongings included purse, cell phone, cell phone charger, clothing, and eyeglasses.     Patient's HCPOA also advised that she believed [facility] in [place] will be funeral home of choice but it may change to [place] Location.   Lauren aware to call if any change to [facility] location is decided.

## 2024-08-04 NOTE — Progress Notes (Signed)
 "  [facility] PROGRESS NOTE   Lauren ROADS FMW:[id number] DOB: 05-Dec-1955 DOA: 06/26/2024  PCP: [name], MD  Brief History: 69 y.o. female with medical history significant of COPD, chronic hypoxemic respiratory failure on 2 L home oxygen, CAD, A-fib on Eliquis , HFpEF, ascending aortic aneurysm followed by cardiothoracic surgery, hypertension, hyperlipidemia, hypothyroidism, CKD stage IIIa, anemia, anxiety, depression, GERD, glaucoma, vision impairment, morbid obesity status post gastric sleeve surgery, PVD.  Recent hospital admission 12/13-12/24 for AKI, pyelonephritis, ileus, mildly elevated LFTs, and acute on chronic hypoxemic respiratory failure secondary to CHF exacerbation. Creatinine had peaked to 3.86.  Her diuretics were held and she was given IV albumin .  Creatinine improved to 2.97 at the time of discharge and nephrology resumed Lasix ..  Completed 7 days of Rocephin  for pyelonephritis.  She was evaluated by physical therapy and discharged with home health PT/OT afternoon.  Patient returned to the ED via EMS due to concern for shortness of breath, fall at home, and possible syncope. Admitted for acute on chronic hypoxic respiratory failure, Acute influenza PNA with superimposed bacterial PNA, acute COPD exacerbation, NSTEMI, acute CHF exacerbation, prolonged Qtc, Hypotension, acute liver failure, and acute on chronic kidney disease.   Consultants: Cardiology.  Infectious disease.  Urology.  Palliative care  Procedures:  Cystoscopy with cystolitholopaxy of 7 cm bladder collection/foreign body.  Ureteroscopy with laser destruction of lesion of upper and lower pole of left kidney with Dr. Cam on 12/28    Subjective/Interval History: Overnight events noted.  Patient has been transitioned to comfort care this morning.  Patient is poorly responsive.  However per nursing staff she is much more comfortable than this morning.  Assessment/Plan:  Acute on chronic hypoxemic  respiratory failure Acute influenza A infection Possible bacterial PNA COPD with acute exacerbation Patient was noted to be saturating in the 80s on her home 2 L Falls with EMS.   Patient was hospitalized and started on antibiotics and nebulizer treatments.  She completed 7-day course of ceftriaxone . Blood gas without evidence for hypercapnia. Dyspnea is likely multifactorial including COPD and CHF now with possibly added component of aspiration. Patient was treated with antibiotics and also given diuretics.  There was also concern for aspiration.  Patient was also treated with Tamiflu  for influenza. However despite maximal treatment patient continued to worsen.  Her oxygen requirements climbed. Eventually transitioned to comfort care earlier this morning.   Acute on chronic HFpEF Hypotension Echocardiogram does show LVEF of 30 to 35%.  Grade 2 diastolic dysfunction noted. Patient was given diuretics and seen by cardiology.  Now comfort care.   Acute complicated UTI Bladder/kidney lesions Sepsis, present on admission Recently finished 7-day course of Rocephin  for pyelonephritis. Prior UCx with polymicrobial growth at that time UA continues to show evidence of pyuria and bacteria. Ucx with E. Faecalis Blood culture without any growth. MRSA negative CT chest w/o contrast showed interval development of patchy ground-glass opacity with interlobular septal thickening in both upper lobes, L > R. Confluent areas of consolidative airspace disease are seen in a patchy distribution of the mid and lower lungs bilaterally. Imaging features are compatible with multifocal pneumonia. CT abdomen pelvis w/o contrast showed mild L hydroureteronephrosis without stone, high density material at upper pole calyx in L kidney that maybe blood product/infectious lesion/neoplasm, lamellated stone with internal gas in urinary bladder, cholelithiasis  Status post ureterocystoscopy w/ L retrograde pyelogram and  Cystolitholapaxy 12/28 concerning for possible L renal and urinary bladder amorphous masses Pathology report of the  left kidney wedge biopsy showed degenerated material with oxalate crystals and bacterial overgrowth.  Fungal elements were not identified.  Fungal ball did not show any fungal elements either. Patient has been on broad-spectrum antibiotics.  Subsequently seen by infectious disease.   Patient is currently noted to be on Augmentin .  Started back on micafungin . Mild hematuria noted.  Hemoglobin is stable though. CT scan was repeated on 12/31 which continues to show indeterminate soft tissue density filling defect within the upper pole collecting system of the left kidney.  This measures 2.5 x 1.7 x 1.7 cm.  Interval placement of the left-sided percutaneous nephro ureteral stent was noted with decompression of previous hydronephrosis.  A 1.1 cm left lower pole kidney stone was seen. Continue Foley for end of life.  Paroxysmal Atrial fibrillation Elevated troponin NSTEMI History of CAD   Acute liver failure  Patient with significant elevation in AST ALT.  Possibly due to shock liver.   Hepatitis panel was unremarkable.  Acetaminophen  level was low.     AKI on CKD stage IIIa High anion gap metabolic acidosis Chronic Intermittent Back pain Dengenerative disc disease Mechanical Fall Lactic acidosis QT prolongation Hyperlipidemia Mild chronic hyponatremia Hypothyroidism Anxiety and depression    Obesity Estimated body mass index is 37.46 kg/m as calculated from the following:   Height as of this encounter: 5' 5 (1.651 m).   Weight as of this encounter: 102.1 kg.  Goals of care Transitioned to comfort care this morning. Anticipate in-hospital death.   Medications: Scheduled:  brimonidine   1 drop Right Eye BID   And   timolol   1 drop Right Eye BID   Chlorhexidine  Gluconate Cloth  6 each Topical Daily   diclofenac  Sodium  2 g Topical TID AC & HS   latanoprost   1 drop  Right Eye QHS   lidocaine   1 patch Transdermal Q24H   mouth rinse  15 mL Mouth Rinse 4 times per day   sodium chloride  flush  10-40 mL Intracatheter Q12H   Continuous:  sodium chloride  20 mL/hr at July 13, 2024 0900   HYDROmorphone  1 mg/hr (Jul 13, 2024 0900)   PRN:sodium chloride , HYDROmorphone  (DILAUDID ) injection, LORazepam , mouth rinse, sodium chloride  flush, trimethobenzamide   Antibiotics: Anti-infectives (From admission, onward)    Start     Dose/Rate Route Frequency Ordered Stop   07/08/24 1800  piperacillin -tazobactam (ZOSYN ) IVPB 3.375 g  Status:  Discontinued        3.375 g 12.5 mL/hr over 240 Minutes Intravenous Every 8 hours 07/08/24 1116 Jul 13, 2024 0805   07/07/24 0800  vancomycin  (VANCOREADY) IVPB 1250 mg/250 mL  Status:  Discontinued        1,250 mg 166.7 mL/hr over 90 Minutes Intravenous Every 24 hours 07/06/24 0507 07/08/24 1115   07/06/24 0600  vancomycin  (VANCOREADY) IVPB 2000 mg/400 mL        2,000 mg 200 mL/hr over 120 Minutes Intravenous  Once 07/06/24 0504 07/06/24 0743   07/06/24 0600  ceFEPIme  (MAXIPIME ) 2 g in sodium chloride  0.9 % 100 mL IVPB  Status:  Discontinued        2 g 200 mL/hr over 30 Minutes Intravenous Every 12 hours 07/06/24 0504 07/08/24 1115   07/05/24 2200  oseltamivir  (TAMIFLU ) capsule 30 mg        30 mg Oral 2 times daily 07/05/24 1340 07/07/24 2159   07/04/24 1000  micafungin  (MYCAMINE ) 100 mg in sodium chloride  0.9 % 100 mL IVPB  Status:  Discontinued        100 mg  105 mL/hr over 1 Hours Intravenous Every 24 hours 07/03/24 1556 07/21/2024 0805   07/03/24 1015  amoxicillin -clavulanate (AUGMENTIN ) 875-125 MG per tablet 1 tablet  Status:  Discontinued        1 tablet Oral Every 12 hours 07/03/24 0922 07/06/24 1041   07/02/24 1200  amoxicillin -clavulanate (AUGMENTIN ) 500-125 MG per tablet 1 tablet  Status:  Discontinued        1 tablet Oral 2 times daily 07/02/24 1011 07/03/24 0922   07/02/24 1100  amoxicillin -clavulanate (AUGMENTIN ) 875-125 MG per  tablet 1 tablet  Status:  Discontinued        1 tablet Oral Every 12 hours 07/02/24 1011 07/02/24 1012   07/01/24 1400  piperacillin -tazobactam (ZOSYN ) IVPB 3.375 g  Status:  Discontinued        3.375 g 12.5 mL/hr over 240 Minutes Intravenous Every 8 hours 07/01/24 1023 07/02/24 1011   06/30/24 1530  micafungin  (MYCAMINE ) 100 mg in sodium chloride  0.9 % 100 mL IVPB  Status:  Discontinued        100 mg 105 mL/hr over 1 Hours Intravenous Every 24 hours 06/30/24 1444 07/03/24 1144   06/29/24 1000  vancomycin  (VANCOCIN ) IVPB 1000 mg/200 mL premix  Status:  Discontinued        1,000 mg 200 mL/hr over 60 Minutes Intravenous Every 48 hours 06/27/24 0624 06/27/24 0924   06/28/24 2200  vancomycin  (VANCOCIN ) IVPB 1000 mg/200 mL premix        1,000 mg 200 mL/hr over 60 Minutes Intravenous  Once 06/28/24 1515 06/28/24 2235   06/28/24 1000  oseltamivir  (TAMIFLU ) capsule 30 mg  Status:  Discontinued        30 mg Oral Daily 06/28/24 0728 07/05/24 1340   06/27/24 1200  piperacillin -tazobactam (ZOSYN ) IVPB 3.375 g  Status:  Discontinued        3.375 g 12.5 mL/hr over 240 Minutes Intravenous Every 8 hours 06/27/24 0624 06/27/24 0924   06/27/24 1200  piperacillin -tazobactam (ZOSYN ) IVPB 2.25 g  Status:  Discontinued        2.25 g 100 mL/hr over 30 Minutes Intravenous Every 8 hours 06/27/24 0924 07/01/24 1023   06/27/24 0924  vancomycin  variable dose per unstable renal function (pharmacist dosing)  Status:  Discontinued         Does not apply See admin instructions 06/27/24 0924 06/29/24 0801   06/27/24 0500  oseltamivir  (TAMIFLU ) capsule 30 mg  Status:  Discontinued        30 mg Oral  Once 06/27/24 0425 06/28/24 0728   06/27/24 0045  piperacillin -tazobactam (ZOSYN ) IVPB 3.375 g        3.375 g 100 mL/hr over 30 Minutes Intravenous  Once 06/27/24 0036 06/27/24 0219   06/27/24 0045  vancomycin  (VANCOREADY) IVPB 1500 mg/300 mL        1,500 mg 150 mL/hr over 120 Minutes Intravenous  Once 06/27/24 0036  06/27/24 0401       Objective:  Vital Signs  Vitals:   Jul 21, 2024 0818 07/21/2024 0837 July 21, 2024 0855 July 21, 2024 0900  BP:      Pulse: (!) 59 61 61 (!) 58  Resp: 11 (!) 9 14 13   Temp:      TempSrc:      SpO2: (!) 53% (!) 56% (!) 55% (!) 56%  Weight:      Height:        Intake/Output Summary (Last 24 hours) at July 21, 2024 1111 Last data filed at July 21, 2024 0900 Gross per 24 hour  Intake 605.77 ml  Output 1275 ml  Net -669.23 ml   Filed Weights   07/07/24 0318 07/08/24 0416 2024/07/21 0357  Weight: 103.1 kg 103.1 kg 102.1 kg   Does not appear to be in any distress or discomfort this morning.  Poorly responsive.   Lab Results:  Data Reviewed: I have personally reviewed following labs and reports of the imaging studies  CBC: Recent Labs  Lab 07/05/24 0500 07/06/24 0437 07/07/24 0355 07/08/24 1005 07/21/2024 0515  WBC 17.2* 19.1* 16.1* 18.9* 17.5*  HGB 10.9* 11.3* 11.3* 10.6* 10.4*  HCT 34.7* 38.1 36.2 35.5* 33.7*  MCV 95.9 99.2 97.1 99.2 96.3  PLT 217 276 242 261 270    Basic Metabolic Panel: Recent Labs  Lab 07/05/24 0500 07/06/24 0437 07/07/24 0355 07/08/24 1005 07/21/24 0515  NA 142 138 140 138 140  K 4.9 4.7 4.6 4.1 4.1  CL 105 98 101 100 102  CO2 29 28 29 25 26   GLUCOSE 181* 253* 178* 197* 154*  BUN 60* 62* 71* 82* 81*  CREATININE 1.30* 1.45* 1.65* 1.76* 1.77*  CALCIUM  9.3 9.1 9.1 8.9 9.1    GFR: Estimated Creatinine Clearance: 35.5 mL/min (A) (by C-G formula based on SCr of 1.77 mg/dL (H)).  Liver Function Tests: Recent Labs  Lab 07/03/24 0552 07/04/24 0948 07/05/24 0500 07/06/24 0437  AST 182* 120* 98* 129*  ALT 564* 409* 318* 303*  ALKPHOS 194* 188* 185* 227*  BILITOT 0.7 0.9 0.7 0.9  PROT 5.9* 6.1* 6.0* 6.1*  ALBUMIN  2.8* 2.5* 2.9* 2.8*     BNP (last 3 results) Recent Labs    06/27/24 0002 06/27/24 0705  PROBNP 8,463.0* 67,156.9*    Recent Results (from the past 240 hours)  Fungus Culture With Stain     Status: None (Preliminary  result)   Collection Time: 06/30/24 10:34 AM   Specimen: Path fluid; Body Fluid  Result Value Ref Range Status   Fungus Stain Final report  Final    Comment: (NOTE) Performed At: [facility] [address] [place], [place] [id number] Jennette Shorter MD Ey:[id number]    Fungus (Mycology) Culture PENDING  Incomplete   Fungal Source FLUID  Final    Comment: LEFT KIDNEY Performed at [facility], [address], [place], [place] [address]   Aerobic/Anaerobic Culture w Gram Stain (surgical/deep wound)     Status: None   Collection Time: 06/30/24 10:34 AM   Specimen: Path fluid; Body Fluid  Result Value Ref Range Status   Specimen Description FLUID LEFT KIDNEY  Final   Special Requests NONE  Final   Gram Stain RARE WBC SEEN NO ORGANISMS SEEN   Final   Culture   Final    No growth aerobically or anaerobically. Performed at [facility], [address] [place], [place] [address]    Report Status 07/05/2024 FINAL  Final  Acid Fast Smear (AFB)     Status: None   Collection Time: 06/30/24 10:34 AM   Specimen: Path fluid; Body Fluid  Result Value Ref Range Status   AFB Specimen Processing Concentration  Final   Acid Fast Smear Negative  Final    Comment: (NOTE) Performed At: [facility] [address] [place], [place] [id number] Jennette Shorter MD Ey:[id number]    Source (AFB) FLUID  Final    Comment: LEFT KIDNEY Performed at [facility], [address], [place], [place] [address]   Fungus Culture Result     Status: None   Collection Time: 06/30/24  10:34 AM  Result Value Ref Range Status   Result 1 Comment  Final    Comment: (NOTE) KOH/Calcofluor preparation:  no fungus observed. Performed At: [facility] [address] [place], [place] [id number] Jennette Shorter MD Ey:[id number]   Culture, blood (Routine X 2) w Reflex to ID Panel     Status: None (Preliminary result)   Collection Time: 07/06/24 10:47 AM   Specimen: BLOOD LEFT  HAND  Result Value Ref Range Status   Specimen Description BLOOD LEFT HAND  Final   Special Requests   Final    BOTTLES DRAWN AEROBIC ONLY Blood Culture results may not be optimal due to an inadequate volume of blood received in culture bottles   Culture   Final    NO GROWTH 3 DAYS Performed at [facility], [address], [place], [place] [address]    Report Status PENDING  Incomplete  Culture, blood (Routine X 2) w Reflex to ID Panel     Status: None (Preliminary result)   Collection Time: 07/06/24 10:48 AM   Specimen: BLOOD LEFT HAND  Result Value Ref Range Status   Specimen Description BLOOD LEFT HAND  Final   Special Requests   Final    BOTTLES DRAWN AEROBIC ONLY Blood Culture results may not be optimal due to an inadequate volume of blood received in culture bottles   Culture   Final    NO GROWTH 3 DAYS Performed at [facility], [address] [place], [place] [address]    Report Status PENDING  Incomplete      Radiology Studies: No results found.    LOS: 12 days   Lauren Clark  [facility] Pager on [email or web address]  July 29, 2024, 11:11 AM   "

## 2024-08-04 NOTE — Death Summary Note (Signed)
 "  DEATH SUMMARY   Patient Details  Name: Lauren Clark MRN: [id number] DOB: June 12, 1956 ERE:Dyjy, Leni Edyth LABOR, MD Admission/Discharge Information   Admit Date:  2024/06/29  Date of Death: Date of Death: 07-12-2024  Time of Death: Time of Death: 07-18-1136  Length of Stay: 2024-07-18   Principle Cause of death: COPD  Brief History: 69 y.o. female with medical history significant of COPD, chronic hypoxemic respiratory failure on 2 L home oxygen, CAD, A-fib on Eliquis , HFpEF, ascending aortic aneurysm followed by cardiothoracic surgery, hypertension, hyperlipidemia, hypothyroidism, CKD stage IIIa, anemia, anxiety, depression, GERD, glaucoma, vision impairment, morbid obesity status post gastric sleeve surgery, PVD.  Recent hospital admission 12/13-12/24 for AKI, pyelonephritis, ileus, mildly elevated LFTs, and acute on chronic hypoxemic respiratory failure secondary to CHF exacerbation. Creatinine had peaked to 3.86.  Her diuretics were held and she was given IV albumin .  Creatinine improved to 2.97 at the time of discharge and nephrology resumed Lasix ..  Completed 7 days of Rocephin  for pyelonephritis.  She was evaluated by physical therapy and discharged with home health PT/OT afternoon.  Patient returned to the ED via EMS due to concern for shortness of breath, fall at home, and possible syncope. Admitted for acute on chronic hypoxic respiratory failure, Acute influenza PNA with superimposed bacterial PNA, acute COPD exacerbation, NSTEMI, acute CHF exacerbation, prolonged Qtc, Hypotension, acute liver failure, and acute on chronic kidney disease.    HOSPITAL COURSE:   Acute on chronic hypoxemic respiratory failure Acute influenza A infection Possible bacterial PNA COPD with acute exacerbation Patient was noted to be saturating in the 80s on her home 2 L [place] with EMS.   Patient was hospitalized and started on antibiotics and nebulizer treatments.  She completed 7-day course of ceftriaxone . Blood gas without  evidence for hypercapnia. Dyspnea is likely multifactorial including COPD and CHF now with possibly added component of aspiration. Patient was treated with antibiotics and also given diuretics.  There was also concern for aspiration.  Patient was also treated with Tamiflu  for influenza. However despite maximal treatment patient continued to worsen.  Her oxygen requirements climbed. Eventually transitioned to comfort care earlier this morning.  Patient subsequently expired on 07-12-2024 at 11:38 AM.   Acute on chronic HFpEF Hypotension Echocardiogram does show LVEF of 30 to 35%.  Grade 2 diastolic dysfunction noted. Patient was given diuretics and seen by cardiology.  Now comfort care.   Acute complicated UTI Bladder/kidney lesions Sepsis, present on admission Recently finished 7-day course of Rocephin  for pyelonephritis. Prior UCx with polymicrobial growth at that time UA continues to show evidence of pyuria and bacteria. Ucx with E. Faecalis Blood culture without any growth. MRSA negative CT chest w/o contrast showed interval development of patchy ground-glass opacity with interlobular septal thickening in both upper lobes, L > R. Confluent areas of consolidative airspace disease are seen in a patchy distribution of the mid and lower lungs bilaterally. Imaging features are compatible with multifocal pneumonia. CT abdomen pelvis w/o contrast showed mild L hydroureteronephrosis without stone, high density material at upper pole calyx in L kidney that maybe blood product/infectious lesion/neoplasm, lamellated stone with internal gas in urinary bladder, cholelithiasis  Status post ureterocystoscopy w/ L retrograde pyelogram and Cystolitholapaxy 12/28 concerning for possible L renal and urinary bladder amorphous masses Pathology report of the left kidney wedge biopsy showed degenerated material with oxalate crystals and bacterial overgrowth.  Fungal elements were not identified.  Fungal ball did not  show any fungal elements either. Patient has been  on broad-spectrum antibiotics.  Subsequently seen by infectious disease.   Patient is currently noted to be on Augmentin .  Started back on micafungin . Mild hematuria noted.  Hemoglobin is stable though. CT scan was repeated on 12/31 which continues to show indeterminate soft tissue density filling defect within the upper pole collecting system of the left kidney.  This measures 2.5 x 1.7 x 1.7 cm.  Interval placement of the left-sided percutaneous nephro ureteral stent was noted with decompression of previous hydronephrosis.  A 1.1 cm left lower pole kidney stone was seen. Continue Foley for end of life.   Paroxysmal Atrial fibrillation Elevated troponin NSTEMI History of CAD   Acute liver failure  Patient with significant elevation in AST ALT.  Possibly due to shock liver.   Hepatitis panel was unremarkable.  Acetaminophen  level was low.     AKI on CKD stage IIIa High anion gap metabolic acidosis Chronic Intermittent Back pain Dengenerative disc disease Mechanical Fall Lactic acidosis QT prolongation Hyperlipidemia Mild chronic hyponatremia Hypothyroidism Anxiety and depression     The results of significant diagnostics from this hospitalization (including imaging, microbiology, ancillary and laboratory) are listed below for reference.   Significant Diagnostic Studies: DG CHEST PORT 1 VIEW Result Date: 07/06/2024 EXAM: 1 VIEW(S) XRAY OF THE CHEST 07/06/2024 04:03:53 AM COMPARISON: 07/02/2024 CLINICAL HISTORY: SOB (shortness of breath) FINDINGS: LINES, TUBES AND DEVICES: Right upper extremity PICC in place with tip at superior cavoatrial junction. LUNGS AND PLEURA: Worsening bilateral patchy airspace opacities. Bilateral pulmonary nodules. No pleural effusion. No pneumothorax. HEART AND MEDIASTINUM: Cardiomegaly. BONES AND SOFT TISSUES: No acute osseous abnormality. IMPRESSION: 1. Worsening bilateral patchy airspace opacities and  bilateral pulmonary nodules. 2. Right upper extremity PICC with tip at the superior cavoatrial junction. 3. Cardiomegaly. Electronically signed by: Evalene Coho MD 07/06/2024 04:20 AM EST RP Workstation: [id number]   ECHO TEE Result Date: 07/05/2024    TRANSESOPHOGEAL ECHO REPORT   Patient Name:   Lauren Clark Date of Exam: 07/05/2024 Medical Rec #:  [id number]    Height:       65.0 in Accession #:    [id number]   Weight:       222.9 lb Date of Birth:  1955-10-12    BSA:          2.071 m Patient Age:    68 years     BP:           115/71 mmHg Patient Gender: F            HR:           86 bpm. Exam Location:  Inpatient Procedure: Transesophageal Echo, Color Doppler and Cardiac Doppler (Both            Spectral and Color Flow Doppler were utilized during procedure). Indications:     atrial fibrillation  History:         Patient has prior history of Echocardiogram examinations, most                  recent 06/27/2024. Takotsubo, aortic aneurysm and CAD, chronic                  kidney disease, Arrythmias:Atrial Fibrillation; Risk                  Factors:Hypertension and Dyslipidemia.  Sonographer:     Tinnie Barefoot RDCS Referring Phys:  [phone] MIHAI CROITORU Diagnosing Phys: Jerel Balding MD PROCEDURE: After discussion of the risks and benefits of  a TEE, an informed consent was obtained from the patient. The transesophogeal probe was passed without difficulty through the esophogus of the patient. Imaged were obtained with the patient in a supine position. Sedation performed by different physician. The patient developed no complications during the procedure. A successful direct current cardioversion was performed at 200 joules with 1 attempt.  IMPRESSIONS  1. Left ventricular ejection fraction, by estimation, is 50%. The left ventricle has mildly decreased function. The left ventricle demonstrates global hypokinesis.  2. Right ventricular systolic function is mildly reduced. The right ventricular size is normal.   3. No left atrial/left atrial appendage thrombus was detected. The LAA emptying velocity was 30 cm/s.  4. The mitral valve is degenerative. Mild mitral valve regurgitation.  5. The aortic valve is tricuspid. There is mild calcification of the aortic valve. There is mild thickening of the aortic valve. Aortic valve regurgitation is moderate. Aortic valve sclerosis/calcification is present, without any evidence of aortic stenosis. Aortic regurgitation PHT measures 350 msec.  6. 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 There isno visible residual aaortic dissection. Aortic dilatation noted. 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 Comparison(s): Left ventricular function has improved and the anteroapical regional wall motion abnormality has resolved. FINDINGS  Left Ventricle: Left ventricular ejection fraction, by estimation, is 50%. The left ventricle has mildly decreased function. The left ventricle demonstrates global hypokinesis. The left ventricular internal cavity size was normal in size. Right Ventricle: The right ventricular size is normal. No increase in right ventricular wall thickness. Right ventricular systolic function is mildly reduced. Left Atrium: Left atrial size was normal in size. No left atrial/left atrial appendage thrombus was detected. The LAA emptying velocity was 30 cm/s. Right Atrium: Right atrial size was normal in size. Prominent Eustachian valve. Pericardium: There is no evidence of pericardial effusion. Mitral Valve: The mitral valve is degenerative in appearance. Mild mitral valve regurgitation. Tricuspid Valve: The tricuspid valve is not well visualized. Tricuspid valve regurgitation is trivial. Aortic Valve: The aortic valve is tricuspid. There is mild calcification of the aortic valve. There is mild thickening of the aortic valve. Aortic valve regurgitation  is moderate. Aortic regurgitation PHT measures 350 msec. Aortic valve sclerosis/calcification is present, without any evidence of aortic stenosis. Pulmonic Valve: The pulmonic valve was grossly normal. Pulmonic valve regurgitation is trivial. 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 There isno visible residual aaortic dissection. Aortic dilatation noted. There is severe dilatation  of the aortic root, measuring 51 mm. IAS/Shunts: No atrial level shunt detected by color flow Doppler.  AORTIC VALVE AI PHT:      350 msec  AORTA Ao Root diam: 5.50 cm Ao Asc diam:  4.10 cm Jerel Croitoru MD Electronically signed by Jerel Balding MD Signature Date/Time: 07/05/2024/2:55:45 PM    Final    EP STUDY Result Date: 07/05/2024 See surgical note for result.  CT ABDOMEN PELVIS WO CONTRAST Result Date: 07/04/2024 EXAM: CT ABDOMEN AND PELVIS WITHOUT CONTRAST 07/03/2024 10:02:27 AM TECHNIQUE: CT of the abdomen and pelvis was performed without the administration of intravenous contrast. Multiplanar reformatted images are provided for review. Automated exposure control, iterative reconstruction, and/or weight-based adjustment of the mA/kV was utilized to reduce the radiation dose to as low as reasonably achievable. COMPARISON: 06/28/2024 CLINICAL HISTORY: f/u left  kidney and bladder mass like lesion - concerning for fungal infection FINDINGS: LOWER CHEST: Unchanged small left pleural effusion. Bilateral progressive airspace opacities are noted within both lower lobes and the right middle lobe, concerning for worsening multifocal infection. Axial image 5/2. LIVER: No suspicious liver abnormality. GALLBLADDER AND BILE DUCTS: Multiple small stones noted within the gallbladder, axial image 27/2. The gallbladder is decompressed without surrounding inflammatory change. No biliary ductal dilatation. SPLEEN: No acute abnormality.  PANCREAS: No acute abnormality. ADRENAL GLANDS: No acute abnormality. KIDNEYS, URETERS AND BLADDER: Interval placement of left-sided percutaneous nephroureteral stent with decompression of previous hydronephrosis. Lower pole left kidney stone measures 1.1 cm. An indeterminate soft tissue density filling defect within the upper pole collecting system of the left kidney persists, measuring 2.5 x 1.7 x 1.7 cm. This measures 42 Hounsfield units on this unenhanced CT. Lack of intravenous contrast material limits further characterization of this lesion. Differential considerations for the left kidney lesion include fungal ball (mycetoma), blood clot, sloughed papilla, or a urothelial tumor. Further evaluation with contrast-enhanced CT or MRI is recommended for better characterization of the left kidney lesion. Extensive right renal cortical scarring with atrophy. The urinary bladder is decompressed around the Foley catheter. No stones in the right kidney or ureters. No right hydronephrosis. No perinephric or periureteral stranding. GI AND BOWEL: Signs of previous gastric partial gastrectomy. No bowel wall thickening, inflammation, or distention. The appendix is visualized and appears normal. There is no bowel obstruction. PERITONEUM AND RETROPERITONEUM: No free fluid or fluid collections. No free air. VASCULATURE: Aorta is normal in caliber. Aortic atherosclerotic calcification. LYMPH NODES: No lymphadenopathy. REPRODUCTIVE ORGANS: Status post hysterectomy. No adnexal mass. BONES AND SOFT TISSUES: Right hip arthroplasty. Multilevel lumbar degenerative disc disease. No acute osseous abnormality. No focal soft tissue abnormality. IMPRESSION: 1. Indeterminate soft tissue density filling defect within the upper pole collecting system of the left kidney persists, measuring 2.5 x 1.7 x 1.7 cm (42 HU on unenhanced CT), with differential including fungal ball, blood clot, and urothelial neoplasm. Recommend further evaluation with  contrast-enhanced CT urogram or renal protocol MRI, and urology consultation. 2. Interval placement of left-sided percutaneous nephroureteral stent with decompression of previous hydronephrosis, with a 1.1 cm lower pole left renal stone. 3. Bilateral progressive airspace opacities within both lower lobes and the right middle lobe, concerning for worsening multifocal infection. Electronically signed by: Waddell Calk MD 07/04/2024 07:36 AM EST RP Workstation: [id number]   DG CHEST PORT 1 VIEW Result Date: 07/02/2024 CLINICAL DATA:  Hypoxia EXAM: PORTABLE CHEST 1 VIEW COMPARISON:  June 26, 2024 FINDINGS: Stable cardiomegaly. Interval placement of right-sided PICC line with distal tip in expected position of cavoatrial junction. No definite acute pulmonary abnormality seen. IMPRESSION: Interval placement of right-sided PICC line with distal tip in expected position of cavoatrial junction. Electronically Signed   By: Lynwood Landy Raddle M.D.   On: 07/02/2024 13:31   MR LUMBAR SPINE WO CONTRAST Result Date: 07/02/2024 EXAM: MRI LUMBAR SPINE 07/02/2024 12:53:13 AM TECHNIQUE: Multiplanar multisequence MRI of the lumbar spine was performed without the administration of intravenous contrast. COMPARISON: None available. CLINICAL HISTORY: Concern for osteomyelitis. Severe pain. FINDINGS: BONES AND ALIGNMENT: Mild dextroscoliosis of the thoracolumbar spine. Grade 1 degenerative anterolisthesis present at L3-L4 and L4-L5. Normal vertebral body heights. Bone marrow signal is unremarkable. Mild facet hypertrophic changes. SPINAL CORD: The conus terminates normally. SOFT TISSUES: No paraspinal mass. T12-L1: Central disc bulge causing mild central spinal canal stenosis. L1-L2: No significant disc herniation. No spinal canal stenosis  or neural foraminal narrowing. L2-L3: No significant disc herniation. No spinal canal stenosis or neural foraminal narrowing. L3-L4: Grade 1 degenerative anterolisthesis with mild-to-moderate  central spinal canal stenosis. L4-L5: Grade 1 anterolisthesis with mild central spinal canal stenosis and moderate right neural foraminal stenosis. L5-S1: Diffuse disc bulging and endplate ridging, but no significant spinal canal or neural foraminal stenosis. IMPRESSION: 1. Mild dextroscoliosis of the thoracolumbar spine. 2. Grade 1 degenerative anterolisthesis at L3-4 and L4-5. 3. Mild central spinal canal stenosis and moderate right neural foraminal stenosis at L4-5. 4. Mild-to-moderate central spinal canal stenosis at L3-4. 5. Mild central spinal canal stenosis at T12-L1. Electronically signed by: Evalene Coho MD 07/02/2024 06:07 AM EST RP Workstation: [id number]   DG C-Arm 1-60 Min Result Date: 06/30/2024 CLINICAL DATA:  Cystoscopy.  Left ureteral stent placement. EXAM: DG C-ARM 1-60 MIN CONTRAST:  Not provided. FLUOROSCOPY: Fluoroscopy Time:  1 minute 41 seconds Radiation Exposure Index (if provided by the fluoroscopic device): 48.17 mGy Number of Acquired Spot Images: 5 COMPARISON:  CT 06/28/2024 FINDINGS: Five fluoroscopic spot views submitted from the operating/procedure room. Contrast opacifies the renal collecting system with placement of ureteral stent. IMPRESSION: Procedural fluoroscopy during cystoscopy. Please reference procedure report for details. Electronically Signed   By: Andrea Gasman M.D.   On: 06/30/2024 17:17   DG C-Arm 1-60 Min-No Report Result Date: 06/30/2024 Fluoroscopy was utilized by the requesting physician.  No radiographic interpretation.   DG C-Arm 1-60 Min-No Report Result Date: 06/30/2024 Fluoroscopy was utilized by the requesting physician.  No radiographic interpretation.   DG Thoracic Spine 1 View Result Date: 06/29/2024 CLINICAL DATA:  Back pain EXAM: THORACIC SPINE 1 VIEW COMPARISON:  None Available. FINDINGS: Lateral views of the thoracic spine submitted. Slight exaggerated thoracic kyphosis. Mild diffuse degenerative disc disease with disc space  narrowing and spurring. No evidence of fracture or compression deformity. IMPRESSION: Mild diffuse degenerative disc disease with only lateral view submitted. Electronically Signed   By: Andrea Gasman M.D.   On: 06/29/2024 12:14   DG Lumbar Spine 1 View Result Date: 06/29/2024 CLINICAL DATA:  Back pain.  Recurrent falls. EXAM: LUMBAR SPINE - 1 VIEW COMPARISON:  Reformats from abdominopelvic CT. FINDINGS: Single lateral view of the lumbar spine submitted. Grade 1 anterolisthesis of L3 on L4 and L4 on L5. No evidence of fracture or compression deformity. Degenerative disc disease at L3-L4, L4-L5, and L5-S1. Multilevel facet hypertrophy. IMPRESSION: 1. No evidence of fracture on single lateral view. 2. Grade 1 anterolisthesis of L3 on L4 and L4 on L5. 3. Degenerative disc disease and facet hypertrophy in the lower lumbar spine. Electronically Signed   By: Andrea Gasman M.D.   On: 06/29/2024 12:13   CT ABDOMEN PELVIS WO CONTRAST Addendum Date: 06/28/2024 ADDENDUM REPORT: 06/28/2024 12:06 ADDENDUM: Note that gas within a bladder stone can be a sign of stone infection and close correlation for urinary tract infection recommended. Electronically Signed   By: Camellia Candle M.D.   On: 06/28/2024 12:06   Result Date: 06/28/2024 CLINICAL DATA:  Acute nonlocalized abdominal pain. EXAM: CT ABDOMEN AND PELVIS WITHOUT CONTRAST TECHNIQUE: Multidetector CT imaging of the abdomen and pelvis was performed following the standard protocol without IV contrast. RADIATION DOSE REDUCTION: This exam was performed according to the departmental dose-optimization program which includes automated exposure control, adjustment of the mA and/or kV according to patient size and/or use of iterative reconstruction technique. COMPARISON:  11/18/2022 FINDINGS: Lower chest: Patchy airspace disease in lung bases is associated with small left  pleural effusion. Hepatobiliary: Tiny low-density posterior right hepatic lobe lesion is too small  to characterize but statistically is most likely benign. No followup imaging is recommended. Small calcified gallstones evident. Gallbladder otherwise unremarkable. Duodenum is normally positioned as is the ligament of Treitz. Pancreas: No focal mass lesion. No dilatation of the main duct. No intraparenchymal cyst. No peripancreatic edema. Spleen: No splenomegaly. No suspicious focal mass lesion. Adrenals/Urinary Tract: No adrenal nodule or mass. Right kidney is atrophic. Mild left hydroureteronephrosis evident. Upper pole calyx in the left kidney is filled with high density material (axial 26/2 and coronal 89/6). Left ureter is dilated down to the UVJ with no obstructing calcified stone evident. 4.9 x 3.9 x 3.0 cm lamellated structure is identified in the non dependent bladder lumen, apparently representing a lamellated stone with internal gas. Stomach/Bowel: Surgical changes are noted in the stomach. Duodenum is normally positioned as is the ligament of Treitz. Duodenal diverticulum noted. No small bowel wall thickening. No small bowel dilatation. The terminal ileum is normal. The appendix is normal. No gross colonic mass. No colonic wall thickening. Vascular/Lymphatic: There is moderate atherosclerotic calcification of the abdominal aorta without aneurysm. There is no gastrohepatic or hepatoduodenal ligament lymphadenopathy. No retroperitoneal or mesenteric lymphadenopathy. No pelvic sidewall lymphadenopathy. Reproductive: Hysterectomy.  There is no adnexal mass. Other: No intraperitoneal free fluid. Musculoskeletal: No worrisome lytic or sclerotic osseous abnormality. IMPRESSION: 1. Mild left hydroureteronephrosis with no obstructing calcified stone evident. Given lack of identifiable stone, mucosal lesion in the UVJ must be excluded. 2. Upper pole calyx in the left kidney is filled with high density material, potentially blood products or infectious debris. Urothelial neoplasm could have this appearance.  Urology consultation recommended. 3. 4.9 x 3.9 x 3.0 cm lamellated structure in the non dependent bladder lumen, apparently representing a lamellated stone with internal gas. The gas within the stone is apparently creating buoyancy. 4. Patchy airspace disease in lung bases is associated with small left pleural effusion. Imaging features could be related to atelectasis or pneumonia. 5. Cholelithiasis. 6.  Aortic Atherosclerosis (ICD10-I70.0). Electronically Signed: By: Camellia Candle M.D. On: 06/28/2024 11:57   US  EKG SITE RITE Result Date: 06/27/2024 If Site Rite image not attached, placement could not be confirmed due to current cardiac rhythm.  ECHOCARDIOGRAM COMPLETE Result Date: 06/27/2024    ECHOCARDIOGRAM REPORT   Patient Name:   Lauren Clark Date of Exam: 06/27/2024 Medical Rec #:  [id number]    Height:       65.0 in Accession #:    [id number]   Weight:       202.0 lb Date of Birth:  1956-02-02    BSA:          1.987 m Patient Age:    68 years     BP:           105/52 mmHg Patient Gender: F            HR:           56 bpm. Exam Location:  Inpatient Procedure: 2D Echo, Cardiac Doppler, Color Doppler and Intracardiac            Opacification Agent (Both Spectral and Color Flow Doppler were            utilized during procedure). STAT ECHO Indications:    Dyspnea  History:        Patient has prior history of Echocardiogram examinations, most  recent 05/20/2024. CHF, COPD; Risk Factors:Hypertension and                 Dyslipidemia.  Sonographer:    Philomena Daring Referring Phys: JONATHAN F BRANCH  Sonographer Comments: Patient is obese. IMPRESSIONS  1. Apical akinesis, pattern could suggest stress induced cardiomyopathy. Cannot exlcude possible LAD ischemia. . Left ventricular ejection fraction, by estimation, is 30 to 35%. The left ventricle has moderately decreased function. The left ventricle demonstrates regional wall motion abnormalities (see scoring diagram/findings for description). There  is moderate left ventricular hypertrophy. Left ventricular diastolic parameters are consistent with Grade II diastolic dysfunction (pseudonormalization). Elevated left atrial pressure.  2. Right ventricular systolic function is normal. The right ventricular size is normal.  3. The mitral valve is abnormal. Trivial mitral valve regurgitation. No evidence of mitral stenosis.  4. The aortic valve was not well visualized. There is mild calcification of the aortic valve. There is mild thickening of the aortic valve. Aortic valve regurgitation is moderate.  5. The inferior vena cava is normal in size with <50% respiratory variability, suggesting right atrial pressure of 8 mmHg. FINDINGS  Left Ventricle: Apical akinesis, pattern could suggest stress induced cardiomyopathy. Cannot exlcude possible LAD ischemia. Left ventricular ejection fraction, by estimation, is 30 to 35%. The left ventricle has moderately decreased function. The left ventricle demonstrates regional wall motion abnormalities. Definity  contrast agent was given IV to delineate the left ventricular endocardial borders. The left ventricular internal cavity size was normal in size. There is moderate left ventricular hypertrophy. Left ventricular diastolic parameters are consistent with Grade II diastolic dysfunction (pseudonormalization). Elevated left atrial pressure.  LV Wall Scoring: The mid and distal anterior septum, apical lateral segment, apical anterior segment, and apex are akinetic. Right Ventricle: The right ventricular size is normal. Right vetricular wall thickness was not well visualized. Right ventricular systolic function is normal. Left Atrium: Left atrial size was not well visualized. Right Atrium: Right atrial size was not well visualized. Pericardium: There is no evidence of pericardial effusion. Mitral Valve: The mitral valve is abnormal. Trivial mitral valve regurgitation. No evidence of mitral valve stenosis. Tricuspid Valve: The  tricuspid valve is not well visualized. Tricuspid valve regurgitation is trivial. No evidence of tricuspid stenosis. Aortic Valve: The aortic valve was not well visualized. There is mild calcification of the aortic valve. There is mild thickening of the aortic valve. There is mild aortic valve annular calcification. Aortic valve regurgitation is moderate. Aortic valve mean gradient measures 4.1 mmHg. Aortic valve peak gradient measures 10.4 mmHg. Aortic valve area, by VTI measures 2.79 cm. Pulmonic Valve: The pulmonic valve was not well visualized. Pulmonic valve regurgitation is not visualized. No evidence of pulmonic stenosis. Aorta: The aortic root was not well visualized. Venous: The inferior vena cava is normal in size with less than 50% respiratory variability, suggesting right atrial pressure of 8 mmHg. IAS/Shunts: No atrial level shunt detected by color flow Doppler.  LEFT VENTRICLE PLAX 2D LVIDd:         5.10 cm   Diastology LVIDs:         3.60 cm   LV e' medial:    5.33 cm/s LV PW:         1.20 cm   LV E/e' medial:  16.2 LV IVS:        1.30 cm   LV e' lateral:   7.83 cm/s LVOT diam:     1.90 cm   LV E/e' lateral: 11.0 LV SV:  86 LV SV Index:   43 LVOT Area:     2.84 cm  RIGHT VENTRICLE             IVC RV S prime:     16.50 cm/s  IVC diam: 2.10 cm TAPSE (M-mode): 2.2 cm LEFT ATRIUM             Index        RIGHT ATRIUM           Index LA diam:        4.10 cm 2.06 cm/m   RA Area:     16.20 cm LA Vol (A2C):   46.7 ml 23.51 ml/m  RA Volume:   35.50 ml  17.87 ml/m LA Vol (A4C):   52.6 ml 26.48 ml/m LA Biplane Vol: 51.0 ml 25.67 ml/m  AORTIC VALVE AV Area (Vmax):    2.27 cm AV Area (Vmean):   2.68 cm AV Area (VTI):     2.79 cm AV Vmax:           161.13 cm/s AV Vmean:          90.612 cm/s AV VTI:            0.309 m AV Peak Grad:      10.4 mmHg AV Mean Grad:      4.1 mmHg LVOT Vmax:         129.00 cm/s LVOT Vmean:        85.600 cm/s LVOT VTI:          0.304 m LVOT/AV VTI ratio: 0.98 MITRAL VALVE  MV Area (PHT): 4.57 cm    SHUNTS MV Decel Time: 166 msec    Systemic VTI:  0.30 m MV E velocity: 86.20 cm/s  Systemic Diam: 1.90 cm MV A velocity: 59.80 cm/s MV E/A ratio:  1.44 Dorn Ross MD Electronically signed by Dorn Ross MD Signature Date/Time: 06/27/2024/9:19:25 AM    Final    CT CHEST WO CONTRAST Result Date: 06/27/2024 CLINICAL DATA:  Respiratory illness. EXAM: CT CHEST WITHOUT CONTRAST TECHNIQUE: Multidetector CT imaging of the chest was performed following the standard protocol without IV contrast. RADIATION DOSE REDUCTION: This exam was performed according to the departmental dose-optimization program which includes automated exposure control, adjustment of the mA and/or kV according to patient size and/or use of iterative reconstruction technique. COMPARISON:  Chest x-ray 1 day prior.  Chest CT 04/22/2024 FINDINGS: Cardiovascular: The heart is enlarged. No substantial pericardial effusion. Coronary artery calcification is evident. Mild atherosclerotic calcification is noted in the wall of the thoracic aorta. Ascending thoracic aorta measures 4.1 cm diameter compatible with aneurysm. Mediastinum/Nodes: No mediastinal lymphadenopathy. No evidence for gross hilar lymphadenopathy although assessment is limited by the lack of intravenous contrast on the current study. The esophagus has normal imaging features. There is no axillary lymphadenopathy. Lungs/Pleura: Interval development of patchy ground-glass opacity with interlobular septal thickening in both upper lobes, left greater than right. Confluent areas of consolidative airspace disease are seen in a patchy distribution of the mid and lower lungs bilaterally. Trace left pleural effusion evident. Upper Abdomen: Surgical changes noted in the stomach. Musculoskeletal: No worrisome lytic or sclerotic osseous abnormality. IMPRESSION: 1. Interval development of patchy ground-glass opacity with interlobular septal thickening in both upper  lobes, left greater than right. Confluent areas of consolidative airspace disease are seen in a patchy distribution of the mid and lower lungs bilaterally. Imaging features are compatible with multifocal pneumonia. 2. Trace left pleural effusion. 3.  Aortic  Atherosclerosis (ICD10-I70.0). Electronically Signed   By: Camellia Candle M.D.   On: 06/27/2024 06:38   US  Abdomen Limited RUQ (LIVER/GB) Result Date: 06/27/2024 CLINICAL DATA:  Epigastric pain. EXAM: ULTRASOUND ABDOMEN LIMITED RIGHT UPPER QUADRANT COMPARISON:  Ultrasound 1 week ago 06/20/2024 FINDINGS: Gallbladder: Physiologically distended. No gallbladder wall thickening. Small gallstones. No pericholecystic fluid. No sonographic Murphy sign noted by sonographer. Common bile duct: Diameter: 4 mm, normal Liver: Diffusely increased parenchymal echogenicity. The echogenic lesion on prior exam was not delineated on the current exam. Technically limited due to difficulty with patient positioning. Portal vein is patent on color Doppler imaging with normal direction of blood flow towards the liver. Other: No right upper quadrant ascites. IMPRESSION: 1. Gallstones without sonographic findings of acute cholecystitis. 2. Increased hepatic parenchymal echogenicity typical of steatosis. 3. Previous echogenic right lobe lesion is not seen on the current exam. Electronically Signed   By: Andrea Gasman M.D.   On: 06/27/2024 01:22   CT Head Wo Contrast Result Date: 06/27/2024 EXAM: CT HEAD AND CERVICAL SPINE 06/26/2024 11:53:00 PM TECHNIQUE: CT of the head and cervical spine was performed without the administration of intravenous contrast. Multiplanar reformatted images are provided for review. Automated exposure control, iterative reconstruction, and/or weight based adjustment of the mA/kV was utilized to reduce the radiation dose to as low as reasonably achievable. COMPARISON: None available. CLINICAL HISTORY: Head trauma, minor (Age >= 65y) FINDINGS: CT HEAD BRAIN  AND VENTRICLES: No acute intracranial hemorrhage. No mass effect or midline shift. No abnormal extra-axial fluid collection. No evidence of acute infarct. No hydrocephalus. A 1.1 cm calcified extra-axial dural based mass along the anterior falx is unchanged. ORBITS: No acute abnormality. SINUSES AND MASTOIDS: No acute abnormality. SOFT TISSUES AND SKULL: No acute skull fracture. No acute soft tissue abnormality. CT CERVICAL SPINE BONES AND ALIGNMENT: No acute fracture or traumatic malalignment. DEGENERATIVE CHANGES: No significant degenerative changes. SOFT TISSUES: No prevertebral soft tissue swelling. IMPRESSION: 1. No acute intracranial abnormality. 2. No acute fracture or traumatic malalignment of the cervical spine. 3. Unchanged small putative left parfalcine meningioma. Electronically signed by: Gilmore Molt 06/27/2024 12:03 AM EST RP Workstation: [id number]   CT Cervical Spine Wo Contrast Result Date: 06/27/2024 EXAM: CT HEAD AND CERVICAL SPINE 06/26/2024 11:53:00 PM TECHNIQUE: CT of the head and cervical spine was performed without the administration of intravenous contrast. Multiplanar reformatted images are provided for review. Automated exposure control, iterative reconstruction, and/or weight based adjustment of the mA/kV was utilized to reduce the radiation dose to as low as reasonably achievable. COMPARISON: None available. CLINICAL HISTORY: Head trauma, minor (Age >= 65y) FINDINGS: CT HEAD BRAIN AND VENTRICLES: No acute intracranial hemorrhage. No mass effect or midline shift. No abnormal extra-axial fluid collection. No evidence of acute infarct. No hydrocephalus. A 1.1 cm calcified extra-axial dural based mass along the anterior falx is unchanged. ORBITS: No acute abnormality. SINUSES AND MASTOIDS: No acute abnormality. SOFT TISSUES AND SKULL: No acute skull fracture. No acute soft tissue abnormality. CT CERVICAL SPINE BONES AND ALIGNMENT: No acute fracture or traumatic malalignment.  DEGENERATIVE CHANGES: No significant degenerative changes. SOFT TISSUES: No prevertebral soft tissue swelling. IMPRESSION: 1. No acute intracranial abnormality. 2. No acute fracture or traumatic malalignment of the cervical spine. 3. Unchanged small putative left parfalcine meningioma. Electronically signed by: Gilmore Molt 06/27/2024 12:03 AM EST RP Workstation: [id number]   DG Knee Complete 4 Views Left Result Date: 06/26/2024 CLINICAL DATA:  Trauma, fall. EXAM: LEFT KNEE - COMPLETE 4+ VIEW COMPARISON:  06/06/2022  FINDINGS: Knee arthroplasty in expected alignment. Prior patellar resurfacing. No acute or periprosthetic fracture. No periprosthetic lucency. No significant joint effusion. Mild generalized soft tissue edema. IMPRESSION: Knee arthroplasty without complication or acute fracture. Electronically Signed   By: Andrea Gasman M.D.   On: 06/26/2024 23:56   DG Foot Complete Left Result Date: 06/26/2024 CLINICAL DATA:  Trauma, fall. EXAM: LEFT FOOT - COMPLETE 3+ VIEW COMPARISON:  None Available. FINDINGS: There is no evidence of fracture or dislocation. Minor talonavicular degenerative change. Dorsal soft tissue edema. IMPRESSION: Dorsal soft tissue edema. No fracture or subluxation. Electronically Signed   By: Andrea Gasman M.D.   On: 06/26/2024 23:55   DG Knee Complete 4 Views Right Result Date: 06/26/2024 CLINICAL DATA:  Trauma, fall. EXAM: RIGHT KNEE - COMPLETE 4+ VIEW COMPARISON:  11/15/2022 FINDINGS: Knee arthroplasty in expected alignment. Prior patellar resurfacing. No acute or periprosthetic fracture. Minimal joint effusion. Mild generalized soft tissue edema. IMPRESSION: Knee arthroplasty without complication or acute fracture. Electronically Signed   By: Andrea Gasman M.D.   On: 06/26/2024 23:54   DG Chest Port 1 View Result Date: 06/26/2024 CLINICAL DATA:  Trauma, fall. EXAM: PORTABLE CHEST 1 VIEW COMPARISON:  06/17/2024 FINDINGS: Cardiomegaly is stable. Mediastinal  contours are unchanged. Stable interstitial coarsening. Subsegmental atelectasis/scarring in the right mid lung. No pneumothorax. No large pleural effusion. On limited assessment, no acute osseous findings. Bilateral shoulder arthropathy. IMPRESSION: 1. No acute findings or evidence of traumatic injury. 2. Stable cardiomegaly. Electronically Signed   By: Andrea Gasman M.D.   On: 06/26/2024 23:53   DG Pelvis Portable Result Date: 06/26/2024 CLINICAL DATA:  Trauma, fall. EXAM: PORTABLE PELVIS 1-2 VIEWS COMPARISON:  06/16/2024 FINDINGS: Right hip arthroplasty in expected alignment. No acute or periprosthetic fracture. Pubic rami are intact. No pubic symphyseal or sacroiliac diastasis. Left hip osteoarthritis. IMPRESSION: Right hip arthroplasty without complication or acute fracture. Electronically Signed   By: Andrea Gasman M.D.   On: 06/26/2024 23:52   US  Abdomen Limited RUQ (LIVER/GB) Result Date: 06/20/2024 CLINICAL DATA:  Elevated LFT. EXAM: ULTRASOUND ABDOMEN LIMITED RIGHT UPPER QUADRANT COMPARISON:  None Available. FINDINGS: Gallbladder: There are sludge and stone within the gallbladder. No bowel wall thickening or pericholecystic fluid. Negative sonographic Murphy's sign. Common bile duct: Diameter: 8 mm Liver: There is diffuse increased liver echogenicity most commonly seen in the setting of fatty infiltration. Superimposed inflammation or fibrosis is not excluded. Clinical correlation is recommended. There is a 1.2 x 1.1 x 1.0 cm echogenic lesion in the right lobe of the liver which is suboptimally characterized on this ultrasound but the echogenic features suggestive of a hemangioma. Portal vein is patent on color Doppler imaging with normal direction of blood flow towards the liver. Other: The visualized right kidney appears echogenic and somewhat atrophic. IMPRESSION: 1. Gallbladder sludge and small stones. No sonographic findings of acute cholecystitis. 2. Fatty liver. Echogenic lesion in the  right lobe of the liver favored to represent a hemangioma. 3. Atrophic and echogenic right kidney in keeping with chronic kidney disease. Electronically Signed   By: Vanetta Chou M.D.   On: 06/20/2024 20:33   DG Abd 1 View Result Date: 06/18/2024 EXAM: 1 VIEW XRAY OF THE ABDOMEN 06/18/2024 06:41:41 PM COMPARISON: CT chest abdomen and pelvis 11/18/2022. CLINICAL HISTORY: Ileus (HCC). FINDINGS: BOWEL: Diffuse gaseous distention of the colon with gas-filled nondistended small bowel. Changes are most likely to indicate adynamic ileus although low colonic obstruction could have this appearance. SOFT TISSUES: Ovoid calcification projecting over the  left mid abdomen measuring 9 mm. This could represent a renal or ureteral stone or possibly opaque medication. BONES: Postoperative right hip arthroplasty. Degenerative changes in the spine. No acute fracture. LUNGS: Visualized lung bases are clear. IMPRESSION: 1. Diffuse gaseous distention of the colon with gas-filled nondistended small bowel, most consistent with adynamic ileus. Low colonic obstruction could have this appearance. 2. Ovoid calcification projecting over the left mid abdomen measuring 9 mm, possibly representing a renal or ureteral stone or opaque medication. Electronically signed by: Elsie Gravely MD 06/18/2024 06:47 PM EST RP Workstation: [id number]   DG Chest Port 1 View Result Date: 06/17/2024 EXAM: 1 VIEW(S) XRAY OF THE CHEST 06/17/2024 01:18:00 AM COMPARISON: CT chest dated 04/22/2024. CLINICAL HISTORY: Wheezing. FINDINGS: LUNGS AND PLEURA: Mild plate-like scarring in the right mid lung. Increased interstitial markings without frank interstitial edema. No pleural effusion. No pneumothorax. HEART AND MEDIASTINUM: Cardiomegaly. BONES AND SOFT TISSUES: No acute osseous abnormality. IMPRESSION: 1. No acute findings. 2. Cardiomegaly. Electronically signed by: Pinkie Pebbles MD 06/17/2024 01:32 AM EST RP Workstation: [id number]   DG HIP UNILAT  WITH PELVIS 1V RIGHT Result Date: 06/16/2024 CLINICAL DATA:  Fall. EXAM: DG HIP (WITH OR WITHOUT PELVIS) 1V RIGHT; DG HIP (WITH OR WITHOUT PELVIS) 1V*L* COMPARISON:  None Available. FINDINGS: There is no evidence of acute hip fracture or dislocation. Mild degenerative changes are present at the left hip. IMPRESSION: Mild degenerative changes at the left hip with no evidence of acute fracture or dislocation. Electronically Signed   By: Leita Birmingham M.D.   On: 06/16/2024 15:20   DG HIP UNILAT WITH PELVIS 1V LEFT Result Date: 06/16/2024 CLINICAL DATA:  Fall. EXAM: DG HIP (WITH OR WITHOUT PELVIS) 1V RIGHT; DG HIP (WITH OR WITHOUT PELVIS) 1V*L* COMPARISON:  None Available. FINDINGS: There is no evidence of acute hip fracture or dislocation. Mild degenerative changes are present at the left hip. IMPRESSION: Mild degenerative changes at the left hip with no evidence of acute fracture or dislocation. Electronically Signed   By: Leita Birmingham M.D.   On: 06/16/2024 15:20   CT HEAD WO CONTRAST ( ) Result Date: 06/16/2024 EXAM: CT HEAD WITHOUT 06/16/2024 02:50:24 PM TECHNIQUE: CT of the head was performed without the administration of intravenous contrast. Automated exposure control, iterative reconstruction, and/or weight based adjustment of the mA/kV was utilized to reduce the radiation dose to as low as reasonably achievable. COMPARISON: Head MRI 02/04/2022. CLINICAL HISTORY: Head trauma, minor (Age >= 65y); fall, on eliquis . FINDINGS: BRAIN AND VENTRICLES: There is no evidence of an acute infarct, intracranial hemorrhage, midline shift, hydrocephalus, or extra-axial fluid collection. There is mild cerebral atrophy. Cerebral white matter hypodensities are nonspecific but compatible with mild chronic small vessel ischemic disease. A 1.1 cm calcified extra-axial mass along the anterior falx is unchanged. Calcified atherosclerosis at the skull base. ORBITS: Right cataract extraction. Left phthisis bulbi. SINUSES  AND MASTOIDS: No acute abnormality. SOFT TISSUES AND SKULL: No acute skull fracture. No acute soft tissue abnormality. IMPRESSION: 1. No acute intracranial abnormality. 2. Mild chronic small vessel ischemic disease. 3. Unchanged small anterior falcine meningioma. Electronically signed by: Dasie Hamburg MD 06/16/2024 03:10 PM EST RP Workstation: [id number]   US  Renal Result Date: 06/15/2024 EXAM: US  Retroperitoneum Complete, Renal. 06/15/2024 04:17:30 AM TECHNIQUE: Real-time ultrasonography of the retroperitoneum renal was performed. COMPARISON: CTA chest abdomen and pelvis on 11/18/2022. CLINICAL HISTORY: 69 year old female with flank pain. FINDINGS: FINDINGS: RIGHT KIDNEY/URETER: Right kidney measures 7.4 x 4.5 x 5.3 cm, estimated volume 92 mL. Chronically partially  atrophied and demonstrates asymmetric decrease in corticomedullary differentiation (series 1 image 27). No hydronephrosis. No calculus. No mass. LEFT KIDNEY/URETER: Left kidney measures 12.1 x 6.3 x 6.4 cm, estimated volume 255 mL. Normal cortical echogenicity. No hydronephrosis. No calculus. No mass. BLADDER: Diminutive, decompressed bladder. IMPRESSION: 1. Chronic partially atrophied right kidney.  No acute findings. Electronically signed by: Helayne Hurst MD 06/15/2024 04:33 AM EST RP Workstation: [id number]    Microbiology: Recent Results (from the past 240 hours)  Fungus Culture With Stain     Status: None (Preliminary result)   Collection Time: 06/30/24 10:34 AM   Specimen: Path fluid; Body Fluid  Result Value Ref Range Status   Fungus Stain Final report  Final    Comment: (NOTE) Performed At: [facility] [address] [place], [place] [id number] Jennette Shorter MD Ey:[id number]    Fungus (Mycology) Culture PENDING  Incomplete   Fungal Source FLUID  Final    Comment: LEFT KIDNEY Performed at [facility], [address] [place], [place] [address]   Aerobic/Anaerobic Culture w Gram Stain (surgical/deep wound)      Status: None   Collection Time: 06/30/24 10:34 AM   Specimen: Path fluid; Body Fluid  Result Value Ref Range Status   Specimen Description FLUID LEFT KIDNEY  Final   Special Requests NONE  Final   Gram Stain RARE WBC SEEN NO ORGANISMS SEEN   Final   Culture   Final    No growth aerobically or anaerobically. Performed at [facility], [address] [place], [place] [address]    Report Status 07/05/2024 FINAL  Final  Acid Fast Smear (AFB)     Status: None   Collection Time: 06/30/24 10:34 AM   Specimen: Path fluid; Body Fluid  Result Value Ref Range Status   AFB Specimen Processing Concentration  Final   Acid Fast Smear Negative  Final    Comment: (NOTE) Performed At: [facility] [name] [place], [place] [id number] Jennette Shorter MD Ey:[id number]    Source (AFB) FLUID  Final    Comment: LEFT KIDNEY Performed at [facility], [address], [place], [place] [address]   Fungus Culture Result     Status: None   Collection Time: 06/30/24 10:34 AM  Result Value Ref Range Status   Result 1 Comment  Final    Comment: (NOTE) KOH/Calcofluor preparation:  no fungus observed. Performed At: [facility] [address] [place], [place] [id number] Jennette Shorter MD Ey:[id number]   Culture, blood (Routine X 2) w Reflex to ID Panel     Status: None (Preliminary result)   Collection Time: 07/06/24 10:47 AM   Specimen: BLOOD LEFT HAND  Result Value Ref Range Status   Specimen Description BLOOD LEFT HAND  Final   Special Requests   Final    BOTTLES DRAWN AEROBIC ONLY Blood Culture results may not be optimal due to an inadequate volume of blood received in culture bottles   Culture   Final    NO GROWTH 3 DAYS Performed at [facility], [address] [place], [place] [address]    Report Status PENDING  Incomplete  Culture, blood (Routine X 2) w Reflex to ID Panel     Status: None (Preliminary result)   Collection Time: 07/06/24 10:48 AM    Specimen: BLOOD LEFT HAND  Result Value Ref Range Status   Specimen Description BLOOD LEFT HAND  Final   Special Requests   Final  BOTTLES DRAWN AEROBIC ONLY Blood Culture results may not be optimal due to an inadequate volume of blood received in culture bottles   Culture   Final    NO GROWTH 3 DAYS Performed at [facility], [address], [place], [place] [address]    Report Status PENDING  Incomplete    Joette Pebbles, MD 2024-07-27    "

## 2024-08-04 DEATH — deceased

## 2024-09-02 ENCOUNTER — Ambulatory Visit: Admitting: Internal Medicine
